# Patient Record
Sex: Female | Born: 1940 | Race: Black or African American | Hispanic: No | Smoking: Former smoker
Health system: Southern US, Community
[De-identification: ages and names within clinical notes are randomized; demographics above are authoritative.]

## PROBLEM LIST (undated history)

## (undated) DIAGNOSIS — I2510 Atherosclerotic heart disease of native coronary artery without angina pectoris: Secondary | ICD-10-CM

## (undated) DIAGNOSIS — M069 Rheumatoid arthritis, unspecified: Secondary | ICD-10-CM

## (undated) DIAGNOSIS — G4730 Sleep apnea, unspecified: Secondary | ICD-10-CM

## (undated) DIAGNOSIS — I5030 Unspecified diastolic (congestive) heart failure: Secondary | ICD-10-CM

## (undated) DIAGNOSIS — K219 Gastro-esophageal reflux disease without esophagitis: Secondary | ICD-10-CM

## (undated) DIAGNOSIS — E039 Hypothyroidism, unspecified: Secondary | ICD-10-CM

## (undated) DIAGNOSIS — K2970 Gastritis, unspecified, without bleeding: Secondary | ICD-10-CM

## (undated) DIAGNOSIS — I4892 Unspecified atrial flutter: Secondary | ICD-10-CM

## (undated) DIAGNOSIS — I480 Paroxysmal atrial fibrillation: Secondary | ICD-10-CM

## (undated) DIAGNOSIS — T7840XA Allergy, unspecified, initial encounter: Secondary | ICD-10-CM

## (undated) DIAGNOSIS — D689 Coagulation defect, unspecified: Secondary | ICD-10-CM

## (undated) DIAGNOSIS — E785 Hyperlipidemia, unspecified: Secondary | ICD-10-CM

## (undated) DIAGNOSIS — M1990 Unspecified osteoarthritis, unspecified site: Secondary | ICD-10-CM

## (undated) DIAGNOSIS — K31819 Angiodysplasia of stomach and duodenum without bleeding: Secondary | ICD-10-CM

## (undated) DIAGNOSIS — G709 Myoneural disorder, unspecified: Secondary | ICD-10-CM

## (undated) DIAGNOSIS — I499 Cardiac arrhythmia, unspecified: Secondary | ICD-10-CM

## (undated) DIAGNOSIS — E669 Obesity, unspecified: Secondary | ICD-10-CM

## (undated) DIAGNOSIS — K449 Diaphragmatic hernia without obstruction or gangrene: Secondary | ICD-10-CM

## (undated) DIAGNOSIS — K644 Residual hemorrhoidal skin tags: Secondary | ICD-10-CM

## (undated) DIAGNOSIS — Z8489 Family history of other specified conditions: Secondary | ICD-10-CM

## (undated) DIAGNOSIS — Z86010 Personal history of colonic polyps: Secondary | ICD-10-CM

## (undated) DIAGNOSIS — I10 Essential (primary) hypertension: Secondary | ICD-10-CM

## (undated) DIAGNOSIS — H269 Unspecified cataract: Secondary | ICD-10-CM

## (undated) DIAGNOSIS — Z7901 Long term (current) use of anticoagulants: Secondary | ICD-10-CM

## (undated) DIAGNOSIS — IMO0002 Reserved for concepts with insufficient information to code with codable children: Secondary | ICD-10-CM

## (undated) DIAGNOSIS — E1122 Type 2 diabetes mellitus with diabetic chronic kidney disease: Secondary | ICD-10-CM

## (undated) DIAGNOSIS — M109 Gout, unspecified: Secondary | ICD-10-CM

## (undated) DIAGNOSIS — D500 Iron deficiency anemia secondary to blood loss (chronic): Secondary | ICD-10-CM

## (undated) HISTORY — DX: Gastro-esophageal reflux disease without esophagitis: K21.9

## (undated) HISTORY — PX: BREAST EXCISIONAL BIOPSY: SUR124

## (undated) HISTORY — DX: Sleep apnea, unspecified: G47.30

## (undated) HISTORY — DX: Reserved for concepts with insufficient information to code with codable children: IMO0002

## (undated) HISTORY — DX: Hypothyroidism, unspecified: E03.9

## (undated) HISTORY — DX: Diaphragmatic hernia without obstruction or gangrene: K44.9

## (undated) HISTORY — DX: Gastritis, unspecified, without bleeding: K29.70

## (undated) HISTORY — DX: Allergy, unspecified, initial encounter: T78.40XA

## (undated) HISTORY — DX: Hyperlipidemia, unspecified: E78.5

## (undated) HISTORY — PX: CATARACT EXTRACTION: SUR2

## (undated) HISTORY — DX: Unspecified osteoarthritis, unspecified site: M19.90

## (undated) HISTORY — DX: Residual hemorrhoidal skin tags: K64.4

## (undated) HISTORY — DX: Atherosclerotic heart disease of native coronary artery without angina pectoris: I25.10

## (undated) HISTORY — DX: Personal history of colonic polyps: Z86.010

## (undated) HISTORY — DX: Myoneural disorder, unspecified: G70.9

## (undated) HISTORY — DX: Essential (primary) hypertension: I10

## (undated) HISTORY — DX: Unspecified cataract: H26.9

## (undated) HISTORY — PX: BREAST BIOPSY: SHX20

## (undated) HISTORY — DX: Unspecified diastolic (congestive) heart failure: I50.30

## (undated) HISTORY — PX: CARDIAC CATHETERIZATION: SHX172

## (undated) HISTORY — PX: COLONOSCOPY: SHX174

## (undated) HISTORY — PX: POLYPECTOMY: SHX149

## (undated) HISTORY — DX: Long term (current) use of anticoagulants: Z79.01

## (undated) HISTORY — DX: Obesity, unspecified: E66.9

## (undated) HISTORY — DX: Unspecified atrial flutter: I48.92

## (undated) HISTORY — DX: Paroxysmal atrial fibrillation: I48.0

## (undated) HISTORY — DX: Coagulation defect, unspecified: D68.9

---

## 1988-02-27 HISTORY — PX: CATARACT EXTRACTION: SUR2

## 1997-06-14 ENCOUNTER — Encounter: Admission: RE | Admit: 1997-06-14 | Discharge: 1997-06-14 | Payer: Self-pay | Admitting: Family Medicine

## 1997-06-24 ENCOUNTER — Encounter: Admission: RE | Admit: 1997-06-24 | Discharge: 1997-06-24 | Payer: Self-pay | Admitting: Family Medicine

## 1997-06-29 ENCOUNTER — Encounter: Admission: RE | Admit: 1997-06-29 | Discharge: 1997-06-29 | Payer: Self-pay | Admitting: Family Medicine

## 1997-07-02 ENCOUNTER — Encounter: Admission: RE | Admit: 1997-07-02 | Discharge: 1997-07-02 | Payer: Self-pay | Admitting: Family Medicine

## 1997-07-07 ENCOUNTER — Encounter: Admission: RE | Admit: 1997-07-07 | Discharge: 1997-07-07 | Payer: Self-pay | Admitting: Family Medicine

## 1997-09-06 ENCOUNTER — Encounter: Admission: RE | Admit: 1997-09-06 | Discharge: 1997-09-06 | Payer: Self-pay | Admitting: Family Medicine

## 1997-09-20 ENCOUNTER — Encounter: Admission: RE | Admit: 1997-09-20 | Discharge: 1997-09-20 | Payer: Self-pay | Admitting: Family Medicine

## 1997-10-25 ENCOUNTER — Encounter: Admission: RE | Admit: 1997-10-25 | Discharge: 1997-10-25 | Payer: Self-pay | Admitting: Family Medicine

## 1997-11-16 ENCOUNTER — Ambulatory Visit (HOSPITAL_COMMUNITY): Admission: RE | Admit: 1997-11-16 | Discharge: 1997-11-16 | Payer: Self-pay | Admitting: *Deleted

## 1997-12-06 ENCOUNTER — Encounter: Admission: RE | Admit: 1997-12-06 | Discharge: 1997-12-06 | Payer: Self-pay | Admitting: Family Medicine

## 1997-12-17 ENCOUNTER — Encounter: Admission: RE | Admit: 1997-12-17 | Discharge: 1997-12-17 | Payer: Self-pay | Admitting: Family Medicine

## 1998-01-07 ENCOUNTER — Encounter: Admission: RE | Admit: 1998-01-07 | Discharge: 1998-01-07 | Payer: Self-pay | Admitting: Family Medicine

## 1998-01-17 ENCOUNTER — Emergency Department (HOSPITAL_COMMUNITY): Admission: EM | Admit: 1998-01-17 | Discharge: 1998-01-17 | Payer: Self-pay | Admitting: Emergency Medicine

## 1998-01-17 ENCOUNTER — Encounter: Payer: Self-pay | Admitting: Emergency Medicine

## 1998-02-07 ENCOUNTER — Encounter: Admission: RE | Admit: 1998-02-07 | Discharge: 1998-02-07 | Payer: Self-pay | Admitting: Family Medicine

## 1998-02-28 ENCOUNTER — Encounter: Admission: RE | Admit: 1998-02-28 | Discharge: 1998-02-28 | Payer: Self-pay | Admitting: Family Medicine

## 1998-03-04 ENCOUNTER — Encounter: Admission: RE | Admit: 1998-03-04 | Discharge: 1998-03-04 | Payer: Self-pay | Admitting: Family Medicine

## 1998-03-28 ENCOUNTER — Encounter: Admission: RE | Admit: 1998-03-28 | Discharge: 1998-03-28 | Payer: Self-pay | Admitting: Family Medicine

## 1998-04-07 ENCOUNTER — Encounter: Admission: RE | Admit: 1998-04-07 | Discharge: 1998-06-08 | Payer: Self-pay | Admitting: Family Medicine

## 1998-07-06 ENCOUNTER — Encounter: Admission: RE | Admit: 1998-07-06 | Discharge: 1998-07-06 | Payer: Self-pay | Admitting: Family Medicine

## 1998-07-13 ENCOUNTER — Encounter: Admission: RE | Admit: 1998-07-13 | Discharge: 1998-07-13 | Payer: Self-pay | Admitting: Family Medicine

## 1998-08-19 ENCOUNTER — Encounter: Admission: RE | Admit: 1998-08-19 | Discharge: 1998-08-19 | Payer: Self-pay | Admitting: Family Medicine

## 1998-09-16 ENCOUNTER — Encounter: Admission: RE | Admit: 1998-09-16 | Discharge: 1998-09-16 | Payer: Self-pay | Admitting: Family Medicine

## 1998-10-17 ENCOUNTER — Encounter: Admission: RE | Admit: 1998-10-17 | Discharge: 1998-10-17 | Payer: Self-pay | Admitting: Family Medicine

## 1998-12-05 ENCOUNTER — Encounter: Payer: Self-pay | Admitting: Internal Medicine

## 1998-12-05 ENCOUNTER — Emergency Department (HOSPITAL_COMMUNITY): Admission: EM | Admit: 1998-12-05 | Discharge: 1998-12-05 | Payer: Self-pay | Admitting: Internal Medicine

## 1999-03-06 ENCOUNTER — Encounter: Admission: RE | Admit: 1999-03-06 | Discharge: 1999-03-06 | Payer: Self-pay | Admitting: Family Medicine

## 1999-03-14 ENCOUNTER — Encounter: Admission: RE | Admit: 1999-03-14 | Discharge: 1999-03-14 | Payer: Self-pay | Admitting: Family Medicine

## 1999-03-14 ENCOUNTER — Encounter: Payer: Self-pay | Admitting: Sports Medicine

## 1999-04-03 ENCOUNTER — Encounter: Admission: RE | Admit: 1999-04-03 | Discharge: 1999-04-03 | Payer: Self-pay | Admitting: Family Medicine

## 1999-04-28 ENCOUNTER — Encounter: Admission: RE | Admit: 1999-04-28 | Discharge: 1999-04-28 | Payer: Self-pay | Admitting: Family Medicine

## 1999-05-03 ENCOUNTER — Encounter: Admission: RE | Admit: 1999-05-03 | Discharge: 1999-08-01 | Payer: Self-pay | Admitting: Family Medicine

## 1999-05-22 ENCOUNTER — Encounter: Admission: RE | Admit: 1999-05-22 | Discharge: 1999-05-22 | Payer: Self-pay | Admitting: Family Medicine

## 1999-06-12 ENCOUNTER — Encounter: Admission: RE | Admit: 1999-06-12 | Discharge: 1999-06-12 | Payer: Self-pay | Admitting: Family Medicine

## 1999-06-13 ENCOUNTER — Encounter: Admission: RE | Admit: 1999-06-13 | Discharge: 1999-06-13 | Payer: Self-pay | Admitting: Family Medicine

## 1999-06-26 ENCOUNTER — Encounter: Admission: RE | Admit: 1999-06-26 | Discharge: 1999-06-26 | Payer: Self-pay | Admitting: Family Medicine

## 1999-07-07 ENCOUNTER — Encounter: Payer: Self-pay | Admitting: Sports Medicine

## 1999-07-07 ENCOUNTER — Encounter: Admission: RE | Admit: 1999-07-07 | Discharge: 1999-07-07 | Payer: Self-pay | Admitting: Family Medicine

## 1999-07-13 ENCOUNTER — Encounter: Admission: RE | Admit: 1999-07-13 | Discharge: 1999-07-13 | Payer: Self-pay | Admitting: Family Medicine

## 1999-09-06 ENCOUNTER — Encounter: Admission: RE | Admit: 1999-09-06 | Discharge: 1999-09-06 | Payer: Self-pay | Admitting: *Deleted

## 1999-09-06 ENCOUNTER — Encounter: Admission: RE | Admit: 1999-09-06 | Discharge: 1999-09-06 | Payer: Self-pay | Admitting: Family Medicine

## 1999-09-06 ENCOUNTER — Encounter: Payer: Self-pay | Admitting: *Deleted

## 1999-09-07 ENCOUNTER — Encounter: Admission: RE | Admit: 1999-09-07 | Discharge: 1999-09-07 | Payer: Self-pay | Admitting: Family Medicine

## 1999-09-07 ENCOUNTER — Inpatient Hospital Stay (HOSPITAL_COMMUNITY): Admission: EM | Admit: 1999-09-07 | Discharge: 1999-09-14 | Payer: Self-pay | Admitting: Emergency Medicine

## 1999-09-07 ENCOUNTER — Ambulatory Visit (HOSPITAL_COMMUNITY): Admission: RE | Admit: 1999-09-07 | Discharge: 1999-09-07 | Payer: Self-pay | Admitting: Family Medicine

## 1999-09-08 ENCOUNTER — Encounter: Payer: Self-pay | Admitting: Family Medicine

## 1999-09-09 ENCOUNTER — Encounter: Payer: Self-pay | Admitting: Family Medicine

## 1999-09-10 ENCOUNTER — Encounter: Payer: Self-pay | Admitting: Family Medicine

## 1999-10-02 ENCOUNTER — Encounter: Admission: RE | Admit: 1999-10-02 | Discharge: 1999-10-02 | Payer: Self-pay | Admitting: Family Medicine

## 1999-10-27 ENCOUNTER — Encounter: Admission: RE | Admit: 1999-10-27 | Discharge: 1999-10-27 | Payer: Self-pay | Admitting: Family Medicine

## 1999-12-13 ENCOUNTER — Encounter: Admission: RE | Admit: 1999-12-13 | Discharge: 1999-12-13 | Payer: Self-pay | Admitting: Family Medicine

## 2000-04-08 ENCOUNTER — Encounter: Admission: RE | Admit: 2000-04-08 | Discharge: 2000-04-08 | Payer: Self-pay | Admitting: Family Medicine

## 2000-05-01 ENCOUNTER — Encounter: Admission: RE | Admit: 2000-05-01 | Discharge: 2000-05-01 | Payer: Self-pay | Admitting: Family Medicine

## 2000-05-20 ENCOUNTER — Encounter: Admission: RE | Admit: 2000-05-20 | Discharge: 2000-05-20 | Payer: Self-pay | Admitting: Family Medicine

## 2000-07-12 ENCOUNTER — Encounter: Admission: RE | Admit: 2000-07-12 | Discharge: 2000-07-12 | Payer: Self-pay | Admitting: Family Medicine

## 2000-07-12 ENCOUNTER — Ambulatory Visit (HOSPITAL_COMMUNITY): Admission: RE | Admit: 2000-07-12 | Discharge: 2000-07-12 | Payer: Self-pay | Admitting: Family Medicine

## 2000-07-30 ENCOUNTER — Encounter: Admission: RE | Admit: 2000-07-30 | Discharge: 2000-07-30 | Payer: Self-pay | Admitting: Family Medicine

## 2000-08-02 ENCOUNTER — Encounter: Admission: RE | Admit: 2000-08-02 | Discharge: 2000-08-02 | Payer: Self-pay | Admitting: Family Medicine

## 2000-08-26 ENCOUNTER — Encounter: Admission: RE | Admit: 2000-08-26 | Discharge: 2000-08-26 | Payer: Self-pay | Admitting: Family Medicine

## 2000-08-30 ENCOUNTER — Encounter: Payer: Self-pay | Admitting: Family Medicine

## 2000-08-30 ENCOUNTER — Encounter: Admission: RE | Admit: 2000-08-30 | Discharge: 2000-08-30 | Payer: Self-pay | Admitting: Family Medicine

## 2000-09-05 ENCOUNTER — Encounter: Payer: Self-pay | Admitting: *Deleted

## 2000-09-05 ENCOUNTER — Encounter: Admission: RE | Admit: 2000-09-05 | Discharge: 2000-09-05 | Payer: Self-pay | Admitting: Family Medicine

## 2000-09-05 ENCOUNTER — Encounter: Admission: RE | Admit: 2000-09-05 | Discharge: 2000-09-05 | Payer: Self-pay | Admitting: *Deleted

## 2000-09-11 ENCOUNTER — Encounter: Payer: Self-pay | Admitting: Gastroenterology

## 2000-09-11 ENCOUNTER — Encounter: Admission: RE | Admit: 2000-09-11 | Discharge: 2000-09-11 | Payer: Self-pay | Admitting: Gastroenterology

## 2000-09-13 ENCOUNTER — Encounter: Admission: RE | Admit: 2000-09-13 | Discharge: 2000-09-13 | Payer: Self-pay | Admitting: Family Medicine

## 2000-09-27 ENCOUNTER — Encounter: Admission: RE | Admit: 2000-09-27 | Discharge: 2000-09-27 | Payer: Self-pay | Admitting: Family Medicine

## 2000-09-27 ENCOUNTER — Encounter: Payer: Self-pay | Admitting: Family Medicine

## 2000-12-04 ENCOUNTER — Encounter
Admission: RE | Admit: 2000-12-04 | Discharge: 2000-12-04 | Payer: Self-pay | Admitting: Physical Medicine and Rehabilitation

## 2000-12-04 ENCOUNTER — Encounter: Payer: Self-pay | Admitting: Physical Medicine and Rehabilitation

## 2000-12-06 ENCOUNTER — Encounter: Admission: RE | Admit: 2000-12-06 | Discharge: 2000-12-06 | Payer: Self-pay | Admitting: Family Medicine

## 2000-12-06 ENCOUNTER — Encounter: Payer: Self-pay | Admitting: Family Medicine

## 2000-12-16 ENCOUNTER — Ambulatory Visit (HOSPITAL_BASED_OUTPATIENT_CLINIC_OR_DEPARTMENT_OTHER): Admission: RE | Admit: 2000-12-16 | Discharge: 2000-12-16 | Payer: Self-pay | Admitting: Otolaryngology

## 2001-02-26 HISTORY — PX: CARPAL TUNNEL RELEASE: SHX101

## 2001-03-07 ENCOUNTER — Encounter: Admission: RE | Admit: 2001-03-07 | Discharge: 2001-03-07 | Payer: Self-pay | Admitting: Family Medicine

## 2001-04-15 ENCOUNTER — Encounter: Admission: RE | Admit: 2001-04-15 | Discharge: 2001-04-15 | Payer: Self-pay | Admitting: Sports Medicine

## 2001-04-28 ENCOUNTER — Encounter: Admission: RE | Admit: 2001-04-28 | Discharge: 2001-04-28 | Payer: Self-pay | Admitting: Family Medicine

## 2001-05-01 ENCOUNTER — Encounter: Admission: RE | Admit: 2001-05-01 | Discharge: 2001-05-01 | Payer: Self-pay | Admitting: Family Medicine

## 2001-05-05 ENCOUNTER — Encounter: Admission: RE | Admit: 2001-05-05 | Discharge: 2001-05-05 | Payer: Self-pay | Admitting: Family Medicine

## 2001-05-19 ENCOUNTER — Encounter: Admission: RE | Admit: 2001-05-19 | Discharge: 2001-05-19 | Payer: Self-pay | Admitting: Family Medicine

## 2001-06-20 ENCOUNTER — Encounter: Admission: RE | Admit: 2001-06-20 | Discharge: 2001-06-20 | Payer: Self-pay | Admitting: Family Medicine

## 2001-07-07 ENCOUNTER — Encounter: Admission: RE | Admit: 2001-07-07 | Discharge: 2001-07-07 | Payer: Self-pay | Admitting: Family Medicine

## 2001-09-08 ENCOUNTER — Encounter: Admission: RE | Admit: 2001-09-08 | Discharge: 2001-09-08 | Payer: Self-pay | Admitting: Family Medicine

## 2001-09-17 ENCOUNTER — Encounter: Admission: RE | Admit: 2001-09-17 | Discharge: 2001-09-17 | Payer: Self-pay | Admitting: Family Medicine

## 2001-12-02 ENCOUNTER — Ambulatory Visit (HOSPITAL_BASED_OUTPATIENT_CLINIC_OR_DEPARTMENT_OTHER): Admission: RE | Admit: 2001-12-02 | Discharge: 2001-12-02 | Payer: Self-pay | Admitting: Orthopedic Surgery

## 2001-12-15 ENCOUNTER — Encounter: Admission: RE | Admit: 2001-12-15 | Discharge: 2001-12-15 | Payer: Self-pay | Admitting: Family Medicine

## 2001-12-21 ENCOUNTER — Encounter: Admission: RE | Admit: 2001-12-21 | Discharge: 2001-12-21 | Payer: Self-pay | Admitting: Sports Medicine

## 2001-12-21 ENCOUNTER — Encounter: Payer: Self-pay | Admitting: Sports Medicine

## 2002-01-30 ENCOUNTER — Encounter: Admission: RE | Admit: 2002-01-30 | Discharge: 2002-01-30 | Payer: Self-pay | Admitting: Family Medicine

## 2002-03-31 ENCOUNTER — Encounter: Admission: RE | Admit: 2002-03-31 | Discharge: 2002-03-31 | Payer: Self-pay | Admitting: Sports Medicine

## 2002-04-01 ENCOUNTER — Ambulatory Visit (HOSPITAL_COMMUNITY): Admission: RE | Admit: 2002-04-01 | Discharge: 2002-04-01 | Payer: Self-pay | Admitting: Sports Medicine

## 2002-04-20 ENCOUNTER — Encounter: Admission: RE | Admit: 2002-04-20 | Discharge: 2002-04-20 | Payer: Self-pay | Admitting: Family Medicine

## 2002-04-27 ENCOUNTER — Encounter: Admission: RE | Admit: 2002-04-27 | Discharge: 2002-04-27 | Payer: Self-pay | Admitting: Family Medicine

## 2002-04-28 ENCOUNTER — Encounter: Payer: Self-pay | Admitting: Family Medicine

## 2002-04-28 ENCOUNTER — Encounter: Admission: RE | Admit: 2002-04-28 | Discharge: 2002-04-28 | Payer: Self-pay | Admitting: Family Medicine

## 2002-04-29 ENCOUNTER — Encounter: Payer: Self-pay | Admitting: Family Medicine

## 2002-04-29 ENCOUNTER — Encounter: Admission: RE | Admit: 2002-04-29 | Discharge: 2002-04-29 | Payer: Self-pay | Admitting: Family Medicine

## 2002-06-01 ENCOUNTER — Encounter: Admission: RE | Admit: 2002-06-01 | Discharge: 2002-06-01 | Payer: Self-pay | Admitting: Family Medicine

## 2002-06-01 ENCOUNTER — Ambulatory Visit (HOSPITAL_COMMUNITY): Admission: RE | Admit: 2002-06-01 | Discharge: 2002-06-01 | Payer: Self-pay | Admitting: Family Medicine

## 2002-07-09 ENCOUNTER — Ambulatory Visit (HOSPITAL_COMMUNITY): Admission: RE | Admit: 2002-07-09 | Discharge: 2002-07-10 | Payer: Self-pay | Admitting: *Deleted

## 2002-07-09 ENCOUNTER — Encounter: Payer: Self-pay | Admitting: *Deleted

## 2002-07-20 ENCOUNTER — Encounter: Admission: RE | Admit: 2002-07-20 | Discharge: 2002-07-20 | Payer: Self-pay | Admitting: Family Medicine

## 2002-08-17 ENCOUNTER — Encounter: Admission: RE | Admit: 2002-08-17 | Discharge: 2002-08-17 | Payer: Self-pay | Admitting: Family Medicine

## 2002-08-27 ENCOUNTER — Encounter: Admission: RE | Admit: 2002-08-27 | Discharge: 2002-08-27 | Payer: Self-pay | Admitting: Sports Medicine

## 2002-09-09 ENCOUNTER — Encounter: Admission: RE | Admit: 2002-09-09 | Discharge: 2002-09-09 | Payer: Self-pay | Admitting: Family Medicine

## 2002-12-19 ENCOUNTER — Emergency Department (HOSPITAL_COMMUNITY): Admission: AD | Admit: 2002-12-19 | Discharge: 2002-12-19 | Payer: Self-pay | Admitting: Family Medicine

## 2002-12-23 ENCOUNTER — Encounter: Admission: RE | Admit: 2002-12-23 | Discharge: 2002-12-23 | Payer: Self-pay | Admitting: Family Medicine

## 2003-01-04 ENCOUNTER — Encounter: Admission: RE | Admit: 2003-01-04 | Discharge: 2003-01-04 | Payer: Self-pay | Admitting: Family Medicine

## 2003-01-04 ENCOUNTER — Encounter: Payer: Self-pay | Admitting: Family Medicine

## 2003-01-18 ENCOUNTER — Encounter: Admission: RE | Admit: 2003-01-18 | Discharge: 2003-01-18 | Payer: Self-pay | Admitting: Family Medicine

## 2003-02-01 ENCOUNTER — Encounter: Admission: RE | Admit: 2003-02-01 | Discharge: 2003-02-01 | Payer: Self-pay | Admitting: Family Medicine

## 2003-03-09 ENCOUNTER — Encounter: Admission: RE | Admit: 2003-03-09 | Discharge: 2003-03-09 | Payer: Self-pay | Admitting: Family Medicine

## 2003-03-26 ENCOUNTER — Encounter: Admission: RE | Admit: 2003-03-26 | Discharge: 2003-03-26 | Payer: Self-pay | Admitting: Family Medicine

## 2003-04-26 ENCOUNTER — Encounter: Admission: RE | Admit: 2003-04-26 | Discharge: 2003-04-26 | Payer: Self-pay | Admitting: Family Medicine

## 2003-09-20 ENCOUNTER — Encounter: Admission: RE | Admit: 2003-09-20 | Discharge: 2003-09-20 | Payer: Self-pay | Admitting: Family Medicine

## 2003-09-22 ENCOUNTER — Encounter: Admission: RE | Admit: 2003-09-22 | Discharge: 2003-09-22 | Payer: Self-pay | Admitting: Family Medicine

## 2003-10-15 ENCOUNTER — Encounter: Admission: RE | Admit: 2003-10-15 | Discharge: 2003-10-15 | Payer: Self-pay | Admitting: Family Medicine

## 2003-10-25 ENCOUNTER — Ambulatory Visit (HOSPITAL_COMMUNITY): Admission: RE | Admit: 2003-10-25 | Discharge: 2003-10-25 | Payer: Self-pay | Admitting: Gastroenterology

## 2003-10-25 ENCOUNTER — Encounter (INDEPENDENT_AMBULATORY_CARE_PROVIDER_SITE_OTHER): Payer: Self-pay | Admitting: Specialist

## 2003-10-25 ENCOUNTER — Encounter (INDEPENDENT_AMBULATORY_CARE_PROVIDER_SITE_OTHER): Payer: Self-pay | Admitting: *Deleted

## 2003-10-27 ENCOUNTER — Ambulatory Visit (HOSPITAL_COMMUNITY): Admission: RE | Admit: 2003-10-27 | Discharge: 2003-10-27 | Payer: Self-pay | Admitting: Gastroenterology

## 2003-11-15 ENCOUNTER — Ambulatory Visit (HOSPITAL_COMMUNITY): Admission: RE | Admit: 2003-11-15 | Discharge: 2003-11-15 | Payer: Self-pay | Admitting: Gastroenterology

## 2003-11-15 ENCOUNTER — Encounter (INDEPENDENT_AMBULATORY_CARE_PROVIDER_SITE_OTHER): Payer: Self-pay | Admitting: *Deleted

## 2003-12-08 ENCOUNTER — Encounter: Admission: RE | Admit: 2003-12-08 | Discharge: 2003-12-08 | Payer: Self-pay | Admitting: Gastroenterology

## 2004-01-07 ENCOUNTER — Ambulatory Visit: Payer: Self-pay | Admitting: Family Medicine

## 2004-03-23 ENCOUNTER — Ambulatory Visit: Payer: Self-pay | Admitting: Family Medicine

## 2004-04-24 ENCOUNTER — Ambulatory Visit: Payer: Self-pay | Admitting: Family Medicine

## 2004-09-04 ENCOUNTER — Ambulatory Visit: Payer: Self-pay | Admitting: Family Medicine

## 2004-10-16 ENCOUNTER — Ambulatory Visit: Payer: Self-pay | Admitting: Family Medicine

## 2004-11-01 ENCOUNTER — Encounter: Admission: RE | Admit: 2004-11-01 | Discharge: 2004-11-01 | Payer: Self-pay | Admitting: Family Medicine

## 2004-11-17 ENCOUNTER — Ambulatory Visit: Payer: Self-pay | Admitting: Family Medicine

## 2005-02-09 ENCOUNTER — Ambulatory Visit: Payer: Self-pay | Admitting: Family Medicine

## 2005-08-17 ENCOUNTER — Ambulatory Visit: Payer: Self-pay | Admitting: Family Medicine

## 2005-08-20 ENCOUNTER — Ambulatory Visit: Payer: Self-pay | Admitting: Cardiovascular Disease

## 2005-08-28 ENCOUNTER — Ambulatory Visit: Payer: Self-pay

## 2005-10-08 ENCOUNTER — Ambulatory Visit: Payer: Self-pay | Admitting: Family Medicine

## 2005-10-15 ENCOUNTER — Ambulatory Visit: Payer: Self-pay | Admitting: Family Medicine

## 2005-10-27 ENCOUNTER — Encounter (INDEPENDENT_AMBULATORY_CARE_PROVIDER_SITE_OTHER): Payer: Self-pay | Admitting: *Deleted

## 2005-10-27 LAB — CONVERTED CEMR LAB

## 2005-11-01 ENCOUNTER — Ambulatory Visit: Payer: Self-pay | Admitting: Family Medicine

## 2005-11-19 ENCOUNTER — Ambulatory Visit: Payer: Self-pay | Admitting: Family Medicine

## 2005-11-19 ENCOUNTER — Encounter: Payer: Self-pay | Admitting: Family Medicine

## 2005-11-22 ENCOUNTER — Encounter: Admission: RE | Admit: 2005-11-22 | Discharge: 2005-11-22 | Payer: Self-pay | Admitting: Family Medicine

## 2005-12-04 ENCOUNTER — Encounter: Admission: RE | Admit: 2005-12-04 | Discharge: 2005-12-04 | Payer: Self-pay | Admitting: Family Medicine

## 2005-12-25 ENCOUNTER — Ambulatory Visit: Payer: Self-pay | Admitting: Sports Medicine

## 2006-01-07 ENCOUNTER — Ambulatory Visit: Payer: Self-pay | Admitting: Sports Medicine

## 2006-01-29 ENCOUNTER — Ambulatory Visit: Payer: Self-pay | Admitting: Family Medicine

## 2006-02-27 ENCOUNTER — Ambulatory Visit: Payer: Self-pay | Admitting: Cardiovascular Disease

## 2006-03-25 ENCOUNTER — Ambulatory Visit: Payer: Self-pay | Admitting: Family Medicine

## 2006-04-25 DIAGNOSIS — E1122 Type 2 diabetes mellitus with diabetic chronic kidney disease: Secondary | ICD-10-CM | POA: Insufficient documentation

## 2006-04-25 DIAGNOSIS — R42 Dizziness and giddiness: Secondary | ICD-10-CM | POA: Insufficient documentation

## 2006-04-25 DIAGNOSIS — K2100 Gastro-esophageal reflux disease with esophagitis, without bleeding: Secondary | ICD-10-CM | POA: Insufficient documentation

## 2006-04-25 DIAGNOSIS — E7800 Pure hypercholesterolemia, unspecified: Secondary | ICD-10-CM | POA: Insufficient documentation

## 2006-04-25 DIAGNOSIS — K5730 Diverticulosis of large intestine without perforation or abscess without bleeding: Secondary | ICD-10-CM | POA: Insufficient documentation

## 2006-04-25 DIAGNOSIS — G5600 Carpal tunnel syndrome, unspecified upper limb: Secondary | ICD-10-CM | POA: Insufficient documentation

## 2006-04-25 DIAGNOSIS — G44209 Tension-type headache, unspecified, not intractable: Secondary | ICD-10-CM | POA: Insufficient documentation

## 2006-04-25 DIAGNOSIS — E669 Obesity, unspecified: Secondary | ICD-10-CM | POA: Insufficient documentation

## 2006-04-25 DIAGNOSIS — M79609 Pain in unspecified limb: Secondary | ICD-10-CM | POA: Insufficient documentation

## 2006-04-25 DIAGNOSIS — M1990 Unspecified osteoarthritis, unspecified site: Secondary | ICD-10-CM

## 2006-04-25 DIAGNOSIS — I10 Essential (primary) hypertension: Secondary | ICD-10-CM | POA: Insufficient documentation

## 2006-04-25 DIAGNOSIS — E039 Hypothyroidism, unspecified: Secondary | ICD-10-CM | POA: Insufficient documentation

## 2006-04-25 DIAGNOSIS — I2510 Atherosclerotic heart disease of native coronary artery without angina pectoris: Secondary | ICD-10-CM | POA: Insufficient documentation

## 2006-04-25 DIAGNOSIS — G4730 Sleep apnea, unspecified: Secondary | ICD-10-CM | POA: Insufficient documentation

## 2006-04-25 HISTORY — DX: Diverticulosis of large intestine without perforation or abscess without bleeding: K57.30

## 2006-04-25 HISTORY — DX: Unspecified osteoarthritis, unspecified site: M19.90

## 2006-04-25 HISTORY — DX: Type 2 diabetes mellitus with diabetic chronic kidney disease: E11.22

## 2006-04-25 HISTORY — DX: Dizziness and giddiness: R42

## 2006-04-26 ENCOUNTER — Encounter (INDEPENDENT_AMBULATORY_CARE_PROVIDER_SITE_OTHER): Payer: Self-pay | Admitting: *Deleted

## 2006-05-20 ENCOUNTER — Ambulatory Visit: Payer: Self-pay | Admitting: Family Medicine

## 2006-05-20 DIAGNOSIS — R5381 Other malaise: Secondary | ICD-10-CM | POA: Insufficient documentation

## 2006-05-20 DIAGNOSIS — M549 Dorsalgia, unspecified: Secondary | ICD-10-CM | POA: Insufficient documentation

## 2006-05-20 DIAGNOSIS — R5383 Other fatigue: Secondary | ICD-10-CM | POA: Insufficient documentation

## 2006-05-20 LAB — CONVERTED CEMR LAB
ALT: 32 units/L (ref 0–35)
AST: 24 units/L (ref 0–37)
Albumin: 4.5 g/dL (ref 3.5–5.2)
Alkaline Phosphatase: 98 units/L (ref 39–117)
BUN: 12 mg/dL (ref 6–23)
Basophils Absolute: 0 10*3/uL (ref 0.0–0.1)
Basophils Relative: 0 % (ref 0–1)
CO2: 23 meq/L (ref 19–32)
Calcium: 10.3 mg/dL (ref 8.4–10.5)
Chloride: 98 meq/L (ref 96–112)
Creatinine, Ser: 0.58 mg/dL (ref 0.40–1.20)
Eosinophils Absolute: 0.6 10*3/uL (ref 0.0–0.7)
Eosinophils Relative: 7 % — ABNORMAL HIGH (ref 0–5)
Glucose, Bld: 116 mg/dL — ABNORMAL HIGH (ref 70–99)
HCT: 40.8 % (ref 36.0–46.0)
Hemoglobin: 12.9 g/dL (ref 12.0–15.0)
Hgb A1c MFr Bld: 7.9 %
Lymphocytes Relative: 37 % (ref 12–46)
Lymphs Abs: 3 10*3/uL (ref 0.7–3.3)
MCHC: 31.6 g/dL (ref 30.0–36.0)
MCV: 86.6 fL (ref 78.0–100.0)
Monocytes Absolute: 0.4 10*3/uL (ref 0.2–0.7)
Monocytes Relative: 5 % (ref 3–11)
Neutro Abs: 4.1 10*3/uL (ref 1.7–7.7)
Neutrophils Relative %: 51 % (ref 43–77)
Platelets: 283 10*3/uL (ref 150–400)
Potassium: 4.1 meq/L (ref 3.5–5.3)
RBC: 4.71 M/uL (ref 3.87–5.11)
RDW: 15.8 % — ABNORMAL HIGH (ref 11.5–14.0)
Sodium: 140 meq/L (ref 135–145)
TSH: 4.05 microintl units/mL (ref 0.350–5.50)
Total Bilirubin: 0.4 mg/dL (ref 0.3–1.2)
Total Protein: 8.1 g/dL (ref 6.0–8.3)
WBC: 8.1 10*3/uL (ref 4.0–10.5)

## 2006-05-23 ENCOUNTER — Encounter: Payer: Self-pay | Admitting: Family Medicine

## 2006-05-30 ENCOUNTER — Telehealth: Payer: Self-pay | Admitting: *Deleted

## 2006-07-09 ENCOUNTER — Ambulatory Visit (HOSPITAL_BASED_OUTPATIENT_CLINIC_OR_DEPARTMENT_OTHER): Admission: RE | Admit: 2006-07-09 | Discharge: 2006-07-09 | Payer: Self-pay | Admitting: Otolaryngology

## 2006-07-14 ENCOUNTER — Ambulatory Visit: Payer: Self-pay | Admitting: Internal Medicine

## 2006-07-30 ENCOUNTER — Emergency Department (HOSPITAL_COMMUNITY): Admission: EM | Admit: 2006-07-30 | Discharge: 2006-07-30 | Payer: Self-pay | Admitting: Emergency Medicine

## 2006-08-09 ENCOUNTER — Telehealth: Payer: Self-pay | Admitting: Family Medicine

## 2006-08-13 ENCOUNTER — Telehealth (INDEPENDENT_AMBULATORY_CARE_PROVIDER_SITE_OTHER): Payer: Self-pay | Admitting: Family Medicine

## 2006-08-26 ENCOUNTER — Ambulatory Visit: Payer: Self-pay | Admitting: Family Medicine

## 2006-08-26 LAB — CONVERTED CEMR LAB: Hgb A1c MFr Bld: 9.4 %

## 2006-09-19 ENCOUNTER — Ambulatory Visit: Payer: Self-pay | Admitting: Cardiovascular Disease

## 2006-09-30 ENCOUNTER — Ambulatory Visit: Payer: Self-pay

## 2006-10-01 ENCOUNTER — Ambulatory Visit: Payer: Self-pay

## 2006-10-09 ENCOUNTER — Telehealth: Payer: Self-pay | Admitting: Family Medicine

## 2006-11-21 ENCOUNTER — Ambulatory Visit: Payer: Self-pay | Admitting: Cardiovascular Disease

## 2006-12-09 ENCOUNTER — Ambulatory Visit: Payer: Self-pay | Admitting: Family Medicine

## 2006-12-09 DIAGNOSIS — R519 Headache, unspecified: Secondary | ICD-10-CM | POA: Insufficient documentation

## 2006-12-09 DIAGNOSIS — R51 Headache: Secondary | ICD-10-CM | POA: Insufficient documentation

## 2006-12-09 LAB — CONVERTED CEMR LAB: Hgb A1c MFr Bld: 9.1 %

## 2006-12-10 LAB — CONVERTED CEMR LAB
ALT: 18 units/L (ref 0–35)
AST: 14 units/L (ref 0–37)
Albumin: 4.6 g/dL (ref 3.5–5.2)
Alkaline Phosphatase: 78 units/L (ref 39–117)
BUN: 16 mg/dL (ref 6–23)
CO2: 24 meq/L (ref 19–32)
Calcium: 10.1 mg/dL (ref 8.4–10.5)
Chloride: 100 meq/L (ref 96–112)
Cholesterol: 258 mg/dL — ABNORMAL HIGH (ref 0–200)
Creatinine, Ser: 0.6 mg/dL (ref 0.40–1.20)
Glucose, Bld: 149 mg/dL — ABNORMAL HIGH (ref 70–99)
HDL: 51 mg/dL (ref >39)
LDL Cholesterol: 172 mg/dL — ABNORMAL HIGH (ref 0–99)
Potassium: 4.3 meq/L (ref 3.5–5.3)
Sodium: 138 meq/L (ref 135–145)
TSH: 3.536 microintl units/mL (ref 0.350–5.50)
Total Bilirubin: 0.4 mg/dL (ref 0.3–1.2)
Total CHOL/HDL Ratio: 5.1
Total Protein: 8.1 g/dL (ref 6.0–8.3)
Triglycerides: 176 mg/dL — ABNORMAL HIGH (ref <150)
VLDL: 35 mg/dL (ref 0–40)

## 2006-12-27 ENCOUNTER — Encounter: Admission: RE | Admit: 2006-12-27 | Discharge: 2006-12-27 | Payer: Self-pay | Admitting: Family Medicine

## 2006-12-30 ENCOUNTER — Ambulatory Visit: Payer: Self-pay | Admitting: Family Medicine

## 2007-02-06 ENCOUNTER — Encounter: Payer: Self-pay | Admitting: Family Medicine

## 2007-02-10 ENCOUNTER — Ambulatory Visit: Payer: Self-pay | Admitting: Family Medicine

## 2007-02-10 ENCOUNTER — Telehealth: Payer: Self-pay | Admitting: *Deleted

## 2007-02-10 DIAGNOSIS — M546 Pain in thoracic spine: Secondary | ICD-10-CM | POA: Insufficient documentation

## 2007-02-24 ENCOUNTER — Encounter: Payer: Self-pay | Admitting: Family Medicine

## 2007-03-07 ENCOUNTER — Encounter: Payer: Self-pay | Admitting: Family Medicine

## 2007-03-27 ENCOUNTER — Encounter: Payer: Self-pay | Admitting: Family Medicine

## 2007-03-27 LAB — CONVERTED CEMR LAB
ALT: 24 units/L
AST: 16 units/L
Albumin: 4.4 g/dL
Alkaline Phosphatase: 93 units/L
BUN: 14 mg/dL
Bilirubin, Direct: 0.1 mg/dL
CO2: 26 meq/L
Calcium: 9.9 mg/dL
Chloride: 98 meq/L
Creatinine, Ser: 0.85 mg/dL
Glucose, Bld: 305 mg/dL
HCT: 39.5 %
Hemoglobin: 12.7 g/dL
Indirect Bilirubin: 0.2 mg/dL
MCV: 89 fL
Platelets: 261 10*3/uL
Potassium: 4.2 meq/L
Sodium: 137 meq/L
TSH: 4.801 microintl units/mL
Total Bilirubin: 0.3 mg/dL
Total Protein: 7.7 g/dL
WBC: 9.6 10*3/uL

## 2007-04-23 ENCOUNTER — Telehealth: Payer: Self-pay | Admitting: *Deleted

## 2007-05-07 ENCOUNTER — Encounter: Payer: Self-pay | Admitting: Family Medicine

## 2007-06-05 ENCOUNTER — Encounter: Payer: Self-pay | Admitting: Family Medicine

## 2007-06-06 ENCOUNTER — Encounter: Admission: RE | Admit: 2007-06-06 | Discharge: 2007-06-06 | Payer: Self-pay | Admitting: Gastroenterology

## 2007-06-30 ENCOUNTER — Ambulatory Visit: Payer: Self-pay

## 2007-06-30 ENCOUNTER — Encounter: Payer: Self-pay | Admitting: Cardiovascular Disease

## 2007-06-30 ENCOUNTER — Ambulatory Visit: Payer: Self-pay | Admitting: Cardiovascular Disease

## 2007-08-28 ENCOUNTER — Ambulatory Visit: Payer: Self-pay | Admitting: Cardiovascular Disease

## 2007-09-22 ENCOUNTER — Telehealth: Payer: Self-pay | Admitting: Family Medicine

## 2007-09-24 ENCOUNTER — Encounter: Payer: Self-pay | Admitting: *Deleted

## 2007-09-29 ENCOUNTER — Telehealth: Payer: Self-pay | Admitting: *Deleted

## 2007-10-01 ENCOUNTER — Ambulatory Visit: Payer: Self-pay | Admitting: Family Medicine

## 2007-10-01 LAB — CONVERTED CEMR LAB
ALT: 13 units/L (ref 0–35)
AST: 11 units/L (ref 0–37)
Albumin: 4.3 g/dL (ref 3.5–5.2)
Alkaline Phosphatase: 75 units/L (ref 39–117)
BUN: 14 mg/dL (ref 6–23)
CO2: 24 meq/L (ref 19–32)
Calcium: 9.6 mg/dL (ref 8.4–10.5)
Chloride: 101 meq/L (ref 96–112)
Creatinine, Ser: 0.51 mg/dL (ref 0.40–1.20)
Direct LDL: 169 mg/dL — ABNORMAL HIGH
Glucose, Bld: 243 mg/dL — ABNORMAL HIGH (ref 70–99)
Hgb A1c MFr Bld: 10.2 %
Potassium: 4.1 meq/L (ref 3.5–5.3)
Sodium: 139 meq/L (ref 135–145)
Total Bilirubin: 0.5 mg/dL (ref 0.3–1.2)
Total Protein: 7.6 g/dL (ref 6.0–8.3)

## 2007-11-06 ENCOUNTER — Ambulatory Visit: Payer: Self-pay | Admitting: Cardiovascular Disease

## 2007-12-19 ENCOUNTER — Ambulatory Visit: Payer: Self-pay | Admitting: Family Medicine

## 2007-12-19 ENCOUNTER — Encounter: Admission: RE | Admit: 2007-12-19 | Discharge: 2007-12-19 | Payer: Self-pay | Admitting: Family Medicine

## 2007-12-19 LAB — CONVERTED CEMR LAB
ALT: 17 units/L (ref 0–35)
AST: 16 units/L (ref 0–37)
Albumin: 4.6 g/dL (ref 3.5–5.2)
Alkaline Phosphatase: 56 units/L (ref 39–117)
BUN: 13 mg/dL (ref 6–23)
CO2: 24 meq/L (ref 19–32)
Calcium: 10 mg/dL (ref 8.4–10.5)
Chloride: 103 meq/L (ref 96–112)
Cholesterol: 211 mg/dL — ABNORMAL HIGH (ref 0–200)
Creatinine, Ser: 0.65 mg/dL (ref 0.40–1.20)
Glucose, Bld: 85 mg/dL (ref 70–99)
HDL: 51 mg/dL (ref >39)
Hgb A1c MFr Bld: 6.9 %
LDL Cholesterol: 139 mg/dL — ABNORMAL HIGH (ref 0–99)
Potassium: 3.9 meq/L (ref 3.5–5.3)
Sodium: 141 meq/L (ref 135–145)
TSH: 2.675 microintl units/mL (ref 0.350–4.50)
Total Bilirubin: 0.6 mg/dL (ref 0.3–1.2)
Total CHOL/HDL Ratio: 4.1
Total Protein: 7.6 g/dL (ref 6.0–8.3)
Triglycerides: 104 mg/dL (ref <150)
Uric Acid, Serum: 9 mg/dL — ABNORMAL HIGH (ref 2.4–7.0)
VLDL: 21 mg/dL (ref 0–40)

## 2008-02-17 ENCOUNTER — Telehealth (INDEPENDENT_AMBULATORY_CARE_PROVIDER_SITE_OTHER): Payer: Self-pay | Admitting: *Deleted

## 2008-03-12 ENCOUNTER — Encounter: Admission: RE | Admit: 2008-03-12 | Discharge: 2008-03-12 | Payer: Self-pay | Admitting: Family Medicine

## 2008-03-31 ENCOUNTER — Encounter: Payer: Self-pay | Admitting: Family Medicine

## 2008-05-05 ENCOUNTER — Ambulatory Visit: Payer: Self-pay | Admitting: Cardiovascular Disease

## 2008-07-01 ENCOUNTER — Telehealth: Payer: Self-pay | Admitting: *Deleted

## 2008-08-13 ENCOUNTER — Ambulatory Visit: Payer: Self-pay | Admitting: Family Medicine

## 2008-08-13 ENCOUNTER — Encounter (INDEPENDENT_AMBULATORY_CARE_PROVIDER_SITE_OTHER): Payer: Self-pay | Admitting: Family Medicine

## 2008-08-13 DIAGNOSIS — J029 Acute pharyngitis, unspecified: Secondary | ICD-10-CM | POA: Insufficient documentation

## 2008-08-13 LAB — CONVERTED CEMR LAB
Bilirubin Urine: NEGATIVE
Blood in Urine, dipstick: NEGATIVE
Glucose, Urine, Semiquant: NEGATIVE
Ketones, urine, test strip: NEGATIVE
Nitrite: NEGATIVE
Protein, U semiquant: NEGATIVE
Rapid Strep: NEGATIVE
Specific Gravity, Urine: 1.02
Urobilinogen, UA: 0.2
pH: 6

## 2008-08-16 LAB — CONVERTED CEMR LAB
ALT: 27 units/L (ref 0–35)
AST: 19 units/L (ref 0–37)
Albumin: 4 g/dL (ref 3.5–5.2)
Alkaline Phosphatase: 64 units/L (ref 39–117)
BUN: 13 mg/dL (ref 6–23)
CO2: 26 meq/L (ref 19–32)
Calcium: 9.5 mg/dL (ref 8.4–10.5)
Chloride: 104 meq/L (ref 96–112)
Creatinine, Ser: 0.62 mg/dL (ref 0.40–1.20)
Glucose, Bld: 120 mg/dL — ABNORMAL HIGH (ref 70–99)
Potassium: 4 meq/L (ref 3.5–5.3)
Sodium: 140 meq/L (ref 135–145)
Total Bilirubin: 0.4 mg/dL (ref 0.3–1.2)
Total Protein: 7.2 g/dL (ref 6.0–8.3)

## 2008-10-01 ENCOUNTER — Ambulatory Visit: Payer: Self-pay | Admitting: Family Medicine

## 2008-10-01 LAB — CONVERTED CEMR LAB
Bilirubin Urine: NEGATIVE
Blood in Urine, dipstick: NEGATIVE
Glucose, Urine, Semiquant: NEGATIVE
Hgb A1c MFr Bld: 6.7 %
Ketones, urine, test strip: NEGATIVE
Nitrite: NEGATIVE
Protein, U semiquant: NEGATIVE
Specific Gravity, Urine: 1.025
Urobilinogen, UA: 0.2
pH: 5.5

## 2008-10-04 ENCOUNTER — Telehealth: Payer: Self-pay | Admitting: Family Medicine

## 2008-10-04 ENCOUNTER — Encounter: Admission: RE | Admit: 2008-10-04 | Discharge: 2008-10-04 | Payer: Self-pay | Admitting: Family Medicine

## 2008-10-04 LAB — CONVERTED CEMR LAB
ALT: 21 units/L (ref 0–35)
AST: 16 units/L (ref 0–37)
Albumin: 4.1 g/dL (ref 3.5–5.2)
Alkaline Phosphatase: 67 units/L (ref 39–117)
BUN: 16 mg/dL (ref 6–23)
CO2: 27 meq/L (ref 19–32)
Calcium: 9.4 mg/dL (ref 8.4–10.5)
Chloride: 105 meq/L (ref 96–112)
Creatinine, Ser: 0.71 mg/dL (ref 0.40–1.20)
Direct LDL: 155 mg/dL — ABNORMAL HIGH
Glucose, Bld: 129 mg/dL — ABNORMAL HIGH (ref 70–99)
Potassium: 4 meq/L (ref 3.5–5.3)
Sodium: 141 meq/L (ref 135–145)
Total Bilirubin: 0.3 mg/dL (ref 0.3–1.2)
Total Protein: 7.2 g/dL (ref 6.0–8.3)
Vit D, 25-Hydroxy: 8 ng/mL — ABNORMAL LOW (ref 30–89)

## 2008-10-06 ENCOUNTER — Encounter: Payer: Self-pay | Admitting: Family Medicine

## 2008-11-02 DIAGNOSIS — R079 Chest pain, unspecified: Secondary | ICD-10-CM | POA: Insufficient documentation

## 2008-11-02 DIAGNOSIS — R609 Edema, unspecified: Secondary | ICD-10-CM | POA: Insufficient documentation

## 2008-11-02 DIAGNOSIS — R002 Palpitations: Secondary | ICD-10-CM | POA: Insufficient documentation

## 2008-11-04 ENCOUNTER — Ambulatory Visit: Payer: Self-pay | Admitting: Cardiovascular Disease

## 2009-02-02 ENCOUNTER — Telehealth: Payer: Self-pay | Admitting: Family Medicine

## 2009-02-02 ENCOUNTER — Ambulatory Visit: Payer: Self-pay | Admitting: Family Medicine

## 2009-02-02 LAB — CONVERTED CEMR LAB: Hgb A1c MFr Bld: 7.3 %

## 2009-02-03 LAB — CONVERTED CEMR LAB
ALT: 16 units/L (ref 0–35)
AST: 11 units/L (ref 0–37)
Albumin: 4.3 g/dL (ref 3.5–5.2)
Alkaline Phosphatase: 59 units/L (ref 39–117)
BUN: 18 mg/dL (ref 6–23)
CO2: 24 meq/L (ref 19–32)
Calcium: 10.1 mg/dL (ref 8.4–10.5)
Chloride: 103 meq/L (ref 96–112)
Cholesterol: 233 mg/dL — ABNORMAL HIGH (ref 0–200)
Creatinine, Ser: 0.57 mg/dL (ref 0.40–1.20)
Glucose, Bld: 156 mg/dL — ABNORMAL HIGH (ref 70–99)
HDL: 52 mg/dL (ref >39)
LDL Cholesterol: 160 mg/dL — ABNORMAL HIGH (ref 0–99)
Potassium: 4.3 meq/L (ref 3.5–5.3)
Sodium: 140 meq/L (ref 135–145)
TSH: 5.572 microintl units/mL — ABNORMAL HIGH (ref 0.350–4.500)
Total Bilirubin: 0.4 mg/dL (ref 0.3–1.2)
Total CHOL/HDL Ratio: 4.5
Total Protein: 7.6 g/dL (ref 6.0–8.3)
Triglycerides: 104 mg/dL (ref <150)
VLDL: 21 mg/dL (ref 0–40)

## 2009-03-15 ENCOUNTER — Telehealth: Payer: Self-pay | Admitting: Family Medicine

## 2009-03-21 ENCOUNTER — Telehealth: Payer: Self-pay | Admitting: Family Medicine

## 2009-04-12 ENCOUNTER — Encounter: Admission: RE | Admit: 2009-04-12 | Discharge: 2009-04-12 | Payer: Self-pay | Admitting: Family Medicine

## 2009-06-06 DIAGNOSIS — Z860100 Personal history of colon polyps, unspecified: Secondary | ICD-10-CM

## 2009-06-06 HISTORY — DX: Personal history of colon polyps, unspecified: Z86.0100

## 2009-06-06 HISTORY — DX: Personal history of colonic polyps: Z86.010

## 2009-07-18 ENCOUNTER — Telehealth: Payer: Self-pay | Admitting: Family Medicine

## 2009-07-19 ENCOUNTER — Telehealth: Payer: Self-pay | Admitting: Family Medicine

## 2009-07-27 ENCOUNTER — Ambulatory Visit: Payer: Self-pay | Admitting: Family Medicine

## 2009-07-27 LAB — CONVERTED CEMR LAB
ALT: 16 units/L (ref 0–35)
AST: 9 units/L (ref 0–37)
Albumin: 4.2 g/dL (ref 3.5–5.2)
Alkaline Phosphatase: 61 units/L (ref 39–117)
BUN: 20 mg/dL (ref 6–23)
CO2: 21 meq/L (ref 19–32)
Calcium: 9.6 mg/dL (ref 8.4–10.5)
Chloride: 104 meq/L (ref 96–112)
Creatinine, Ser: 0.6 mg/dL (ref 0.40–1.20)
Direct LDL: 115 mg/dL — ABNORMAL HIGH
Glucose, Bld: 168 mg/dL — ABNORMAL HIGH (ref 70–99)
Hgb A1c MFr Bld: 7.5 %
Potassium: 4.1 meq/L (ref 3.5–5.3)
Sodium: 139 meq/L (ref 135–145)
TSH: 0.861 microintl units/mL (ref 0.350–4.500)
Total Bilirubin: 0.3 mg/dL (ref 0.3–1.2)
Total Protein: 7.3 g/dL (ref 6.0–8.3)

## 2009-08-10 ENCOUNTER — Ambulatory Visit: Payer: Self-pay | Admitting: Cardiovascular Disease

## 2009-08-19 ENCOUNTER — Encounter (INDEPENDENT_AMBULATORY_CARE_PROVIDER_SITE_OTHER): Payer: Self-pay | Admitting: *Deleted

## 2009-10-19 ENCOUNTER — Telehealth: Payer: Self-pay | Admitting: Family Medicine

## 2009-10-19 ENCOUNTER — Encounter: Payer: Self-pay | Admitting: Family Medicine

## 2009-10-19 ENCOUNTER — Ambulatory Visit: Payer: Self-pay | Admitting: Family Medicine

## 2009-10-19 ENCOUNTER — Encounter: Admission: RE | Admit: 2009-10-19 | Discharge: 2009-10-19 | Payer: Self-pay | Admitting: Family Medicine

## 2009-12-14 ENCOUNTER — Encounter: Payer: Self-pay | Admitting: Family Medicine

## 2009-12-23 ENCOUNTER — Telehealth: Payer: Self-pay | Admitting: Family Medicine

## 2010-03-20 ENCOUNTER — Encounter: Payer: Self-pay | Admitting: Family Medicine

## 2010-03-27 ENCOUNTER — Telehealth: Payer: Self-pay | Admitting: Family Medicine

## 2010-03-28 ENCOUNTER — Other Ambulatory Visit: Payer: Self-pay | Admitting: Family Medicine

## 2010-03-28 ENCOUNTER — Encounter (INDEPENDENT_AMBULATORY_CARE_PROVIDER_SITE_OTHER): Payer: Self-pay | Admitting: *Deleted

## 2010-03-28 DIAGNOSIS — Z1239 Encounter for other screening for malignant neoplasm of breast: Secondary | ICD-10-CM

## 2010-03-28 NOTE — Letter (Signed)
Summary: Advance Beneficiary Notice  Advance Beneficiary Notice   Imported By: Bradly Bienenstock 10/09/2007 15:45:20  _____________________________________________________________________  External Attachment:    Type:   Image     Comment:   External Document

## 2010-03-28 NOTE — Progress Notes (Signed)
Summary: Rx  Phone Note Refill Request Call back at Home Phone [phone] Message from:  Patient  Refills Requested: Medication #1:  HYDROCHLOROTHIAZIDE 25 MG TABS Take 1 tablet by mouth once a day  Medication #2:  JANUVIA 100 MG TABS Take 1 tablet by mouth once a day  Medication #3:  ONETOUCH TEST  STRP 1 strip three times a day Needs [name] to fax new rx's to [facility] [phone], is completely out and [facility] [place] [phone] for the mean time, doesn't need onetouch [facility] just [facility].  Initial call taken by: Haydee Salter,  September 22, 2007 10:54 AM      Prescriptions: HYDROCHLOROTHIAZIDE 25 MG TABS (HYDROCHLOROTHIAZIDE) Take 1 tablet by mouth once a day  #30 x 0   Entered by:   Theresia Lo RN   Authorized by:   Doralee Albino MD   Signed by:   Theresia Lo RN on 09/22/2007   Method used:   [facility] ...       [facility]  [address] [phone]*       [address]       [place], [place]  [address]       Ph: [phone]       Fax: [phone]   RxID:   [id number] JANUVIA 100 MG TABS (SITAGLIPTIN PHOSPHATE) Take 1 tablet by mouth once a day  #30 x 0   Entered by:   Theresia Lo RN   Authorized by:   Doralee Albino MD   Signed by:   Theresia Lo RN on 09/22/2007   Method used:   Electronically sent to ...       [facility]  [address] [phone]*       [address]       [place], [place]  [address]       Ph: [phone]       Fax: [phone]   RxID:   [id number]  [facility]  [address] [facility] one month supply. attempted to call patient and message left to return call. office visit is needed. will continue to try to contact patient to advise of this. will send message to Dr.Charlena Haub to ask if he wants to refill for [facility] or if patient must be seen first. Theresia Lo RN  September 22, 2007 5:08 PM

## 2010-03-28 NOTE — Progress Notes (Signed)
Summary: Medication  Phone Note Call from Patient Call back at [phone]   Reason for Call: Talk to Nurse Summary of Call: pt is out of genuvia, she is requesting samples, pt is also requesting an Rx for allegra, she sts the doctor gave that to her before, pt is breaking out in hives Initial call taken by: ERIN LEVAN,  August 09, 2006 11:44 AM  Follow-up for Phone Call        wants mail order refill on januvia & HCTZ, also wants allegra ordered. she will call back with name of mail order pharmacy & mg of januvia. message to md Follow-up by: Golden Circle RN,  August 09, 2006 11:59 AM  Additional Follow-up for Phone Call Additional follow up Details #1::        Once I have particulars, I'll Rx via DrFirst Additional Follow-up by: Doralee Albino MD,  August 09, 2006 12:28 PM   Additional Follow-up for Phone Call Additional follow up Details #2::    pt calling back, she is requesting an Rx for genuvia 100 mgs to be [facility] [facility] one month and then another rx sent to [facility] for 90 day supply. Pt [facility] the allegra [facility] Follow-up by: ERIN LEVAN,  August 12, 2006 9:47 AM  Additional Follow-up for Phone Call Additional follow up Details #3:: Details for Additional Follow-up Action Taken: Done except I used loratidine (Claritin) rather than allegra to save her money Additional Follow-up by: Doralee Albino MD,  August 13, 2006 9:35 AM  Phone call to pt - she said she would actually rather have allegra sent to [facility] [place] because it helps her with her hives.  Will forward to Dr. Leveda Anna. ..................................................................Marland KitchenAMY MARTIN RN  August 13, 2006 12:22 PM  Done via DrFirst

## 2010-03-28 NOTE — Progress Notes (Signed)
Summary: Courtney Pena  Phone Note Call from Patient Call back at Work Phone [phone]   Caller: Patient Summary of Call: PT IS THINKS SHE HAS KIDNEY STONE AND IS EXPERIENCING SOME RIGHT SIDE AND BACK PAIN THAT CAN INTENSE AT TIMES.  WOULD LIKE TO KNOW IF SHE SHOULD BE SEEN OR WAIT ON DR HENSEL TO SEE HER ON MONDAY Initial call taken by: Dedra Skeens CMA,,  February 17, 2008 10:26 AM  Follow-up for Phone Call        OK to wait as long as no fever or signs of infection.  Must be seen immediately if fever.  OK to use pain med.  Please print Hydrocodone/APAP Rx and fax to pharm.  I don't think I am in the office next week.  I think I am on the inpatient service.  Will likely need to be seen by someone else. Follow-up by: Doralee Albino MD,  February 17, 2008 12:34 PM  Additional Follow-up for Phone Call Additional follow up Details #1::        lm on vm stating message from Dr Leveda Anna an advised pt to call me to give pharmacy so I can fax pain script  Will await return call from pt. Additional Follow-up by: Dedra Skeens CMA,,  February 17, 2008 2:12 PM    Additional Follow-up for Phone Call Additional follow up Details #2::    PT RX S[facility]X [facility] [phone].  PT NOTIFIED VIA PHONE WILL CALL OFFICE TO SCHEDULE APPT IF PAIN PERSIST AND AN ONSET OF FEVER.  ADVISE TO CALL ME WITH ANY FUTURE QUESTIONS OR CONCERNS. Follow-up by: Dedra Skeens CMA,,  February 17, 2008 3:22 PM  New/Updated Medications: HYDROCODONE-ACETAMINOPHEN 5-325 MG TABS (HYDROCODONE-ACETAMINOPHEN) 1 by mouth up to 4 times per day as needed for pain   Prescriptions: HYDROCODONE-ACETAMINOPHEN 5-325 MG TABS (HYDROCODONE-ACETAMINOPHEN) 1 by mouth up to 4 times per day as needed for pain  #30 x 0   Entered by:   Dedra Skeens CMA,   Authorized by:   Doralee Albino MD   Signed by:   Dedra Skeens CMA, on 02/17/2008   Method used:   Printed then faxed to ...       [facility]  [id number] (retail)      [address]       [place], [place]  [address]       Ph: [phone] or [phone]       Fax: [phone]   RxID:   [id number] HYDROCODONE-ACETAMINOPHEN 5-325 MG TABS (HYDROCODONE-ACETAMINOPHEN) 1 by mouth up to 4 times per day as needed for pain  #30 x 0   Entered by:   Eather Colas PATE CMA,   Authorized by:   Doralee Albino MD   Signed by:   Dedra Skeens CMA, on 02/17/2008   Method used:   Print then Give to Patient   RxID:   [id number] HYDROCODONE-ACETAMINOPHEN 5-325 MG TABS (HYDROCODONE-ACETAMINOPHEN) 1 by mouth up to 4 times per day as needed for pain  #30 x 0   Entered and Authorized by:   Doralee Albino MD   Signed by:   Doralee Albino MD on 02/17/2008   Method used:   Printed then faxed to ...       [facility]  [place] (retail)       [address]       [place], [place]  [address]       Ph: [phone] or [phone]       Fax:  [phone]   RxID:   [id number]

## 2010-03-28 NOTE — Consult Note (Signed)
Summary: ENT  ENT   Imported By: Haydee Salter 06/10/2006 15:26:59  _____________________________________________________________________  External Attachment:    Type:   Image     Comment:   External Document

## 2010-03-28 NOTE — Procedures (Signed)
Summary: Colon   Colonoscopy  Procedure date:  11/15/2003  Findings:      Location:  [facility].   NAME:  Courtney Pena, Courtney Pena     ACCOUNT NO.:  [id number]   MEDICAL RECORD NO.:  [id number]          PATIENT TYPE:  AMB   LOCATION:  ENDO                         FACILITY:  [facility]   PHYSICIAN:  Anselmo Rod, M.D.  DATE OF BIRTH:  Mar 09, 1940   DATE OF PROCEDURE:  11/15/2003  DATE OF DISCHARGE:  11/15/2003                                 OPERATIVE REPORT   PROCEDURE PERFORMED:  Screening colonoscopy.   ENDOSCOPIST:  Charna Elizabeth, M.D.   INSTRUMENT USED:  Olympus video colonoscope.   INDICATIONS FOR PROCEDURE:  The patient is a 70 year old African-American  female with a history of blood in stool.  Rule out colonic polyps, masses,  etc.   PREPROCEDURE PREPARATION:  Informed consent was procured from the patient.  The patient was fasted for eight hours prior to the procedure and prepped  with a bottle of magnesium citrate and a gallon of GoLYTELY the night prior  to the procedure.   PREPROCEDURE PHYSICAL:  The patient had stable vital signs.  Neck supple.  Chest clear to auscultation.  S1 and S2 regular.  Abdomen soft with normal  bowel sounds.   DESCRIPTION OF PROCEDURE:  The patient was placed in left lateral decubitus  position and sedated with 100 mg of Demerol and 10 mg of Versed in slow  incremental doses.  Once the patient was adequately sedated and maintained  on low flow oxygen and continuous cardiac monitoring, the Olympus video  colonoscope was advanced from the rectum to the cecum.  Except for internal  and external hemorrhoids, no other abnormalities were noted.  No masses,  polyps, erosions, ulcerations or diverticula were seen.  The procedure was  complete up to the cecum.  The appendicular orifice and ileocecal valve were  clearly visualized and photographed.  The patient tolerated the procedure  well without complication.   IMPRESSION:   Unrevealing colonoscopy up to the cecum except or small  internal and external hemorrhoids.   RECOMMENDATIONS:  1.  Continue high fiber diet with liberal fluid intake.  2.  Repeat colonoscopy is recommended in the next five years unless the      patient develops any abnormal symptoms in the interim.  3.  Outpatient followup in the next two weeks or earlier if need be.       JNM/MEDQ  D:  11/17/2003  T:  11/18/2003  Job:  [id number]   cc:   William A. Leveda Anna, M.D.  Fax: [phone]

## 2010-03-28 NOTE — Consult Note (Signed)
Summary: [facility]   Imported By: De Nurse 12/22/2009 12:21:31  _____________________________________________________________________  External Attachment:    Type:   Image     Comment:   External Document

## 2010-03-28 NOTE — Assessment & Plan Note (Signed)
Summary: cpp/pap/el   Vital Signs:  Patient Profile:   70 Years Old Female Height:     65.5 inches Weight:      277.8 pounds BMI:     45.69 Temp:     97.5 degrees F Pulse rate:   75 / minute BP sitting:   137 / 86  Pt. in pain?   no  Vitals Entered By: Altamese Dilling CMA, (December 09, 2006 1:23 PM)                  Chief Complaint:  CPE/Pap.  History of Present Illness: 1. DM control poor - A1C=9.1  Will work on diet and exercise.  Will recheck in 3 months.  If not better, will start insulin.  Had good eye exam 2 weeks ago.    2. Obesity, Wt loss noted. She has been following diet better.  No sig increase in exercise.  3. Mammogram - has scheduled Had colonoscopy 10/05 Had normal pap last year at age 71- no more paps unless sx. Flu shot needs one Needs booster pneumovax Tetanus is up to date Never had zostavax  4. Has had fleeting Lt temporal pain.  No visual changes, present x 3 weeks  Call results to [phone]  OK to leave message on voice mail    Current Allergies: ACTOS (PIOGLITAZONE HCL) AMOXICILLIN (AMOXICILLIN) COZAAR (LOSARTAN POTASSIUM) LISINOPRIL (LISINOPRIL) SULFAMETHOXAZOLE (SULFAMETHOXAZOLE)  Past Medical History:    Reviewed history from 05/20/2006 and no changes required:       ANA uncertain significance,        atypical chest pain       , minimal CAD by cath 5/04, nl EF,        spinal stenosis by MRI, sx poorly correlate 12/03       Has sleep apnea.  Need device given 10/08   Past Surgical History:    Reviewed history from 04/25/2006 and no changes required:       ABI=0.87 (mild obstruction) - 05/01/2001, Barium Enema -, bilateral carpal tunnel surgery in 2003 - 04/21/2002, bone scan -, breast biopsy 1985 -, Cardiac Cath - 07/14/2002, Cardiac Cath 1999 normal -, CXR -, ECG -, Echo -, Lipid TC=211, LDL=130, HDL=54, TG=134  01/29/06 - 62/02/3084, PFT -, Xray - Spine -   Family History:    Reviewed history from 04/25/2006 and no changes  required:       - Ca, CVA, Etohism, + CAD, DM, meningioma  Social History:    Reviewed history from 05/20/2006 and no changes required:       nonsmoker, nondrinker;        healthy diet; no exercise;        works in Insurance account manager     Physical Exam  General:     Well-developed,obese,in no acute distress; alert,appropriate and cooperative throughout examination Head:     Normocephalic and atraumatic without obvious abnormalities. No apparent alopecia or balding. Neck:     No deformities, masses, or tenderness noted. Lungs:     Normal respiratory effort, chest expands symmetrically. Lungs are clear to auscultation, no crackles or wheezes. Heart:     Normal rate and regular rhythm. S1 and S2 normal without gallop, murmur, click, rub or other extra sounds. Abdomen:     Bowel sounds positive,abdomen soft and non-tender without masses, organomegaly or hernias noted.    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 Take 1 tablet by mouth once a day    Glipizide 10 Mg Tb24 (Glipizide) .Marland Kitchen... Take 1 tablet by mouth once a day    Januvia 100 Mg Tabs (Sitagliptin phosphate) .Marland Kitchen... Take 1 tablet by mouth once a day    Metformin Hcl 1000 Mg Tabs (Metformin hcl) ..... One by mouth two times a day  Orders: A1C-[facility] ([address]) [facility]- Est  Level 4 ([address])   Problem # 2:  OBESITY, NOS (ICD-278.00) Good wt loss  Problem # 3:  HYPERCHOLESTEROLEMIA (ICD-272.0)  Her updated medication list for this problem includes:    Crestor 10 Mg Tabs (Rosuvastatin calcium) .Marland Kitchen... Take 1 tablet by mouth once a day  Orders: Lipid-[facility] ([address]-91478) Comp Met-[facility] (29562-13086) TSH-[facility] ([address]-96295) [facility]- Est  Level 4 ([address])   Problem # 4:  SYMPTOM, HEADACHE (ICD-784.0)  Her updated medication list for this problem includes:    Bayer Childrens  Aspirin 81 Mg Chew (Aspirin) .Marland Kitchen... Take 1 tablet by mouth once a day    Diclofenac Sodium Cr 100 Mg Tb24 (Diclofenac sodium) .Marland Kitchen... Take 1 tablet by mouth once a day prn    Metoprolol Succinate 25 Mg Tb24 (Metoprolol succinate) ..... One tab daily  Orders: Sed Rate (ESR)-[facility] [phone])   Complete Medication List: 1)  Bayer Childrens Aspirin 81 Mg Chew (Aspirin) .... Take 1 tablet by mouth once a day 2)  Crestor 10 Mg Tabs (Rosuvastatin calcium) .... Take 1 tablet by mouth once a day 3)  Diclofenac Sodium Cr 100 Mg Tb24 (Diclofenac sodium) .... Take 1 tablet by mouth once a day prn 4)  Fexofenadine Hcl 180 Mg Tabs (Fexofenadine hcl) .... Take 1 tablet by mouth once a day as needed hives 5)  Fluticasone Propionate 50 Mcg/act Susp (Fluticasone propionate) .... Spray 2 spray into both nostrils once a day 6)  Glipizide 10 Mg Tb24 (Glipizide) .... Take 1 tablet by mouth once a day 7)  Hydrochlorothiazide 25 Mg Tabs (Hydrochlorothiazide) .... Take 1 tablet by mouth once a day 8)  Januvia 100 Mg Tabs (Sitagliptin phosphate) .... Take 1 tablet by mouth once a day 9)  Magnebind 300 250-300 Mg Tabs (Calcium carb-magnesium carb) .... Take 1 tablet by mouth once a day 10)  Meclizine Hcl 25 Mg Tabs (Meclizine hcl) .... Take 1 tablet by mouth every six hours as needed dizziness 11)  Metformin Hcl 1000 Mg Tabs (Metformin hcl) .... One by mouth two times a day 12)  Norvasc 10 Mg Tabs (Amlodipine besylate) .Marland Kitchen.. 1 tablet by mouth once a day 13)  Onetouch Test Strp (Glucose blood) .Marland Kitchen.. 1 strip three times a day 14)  Ranitidine Hcl 150 Mg Caps (Ranitidine hcl) .... Take 1 capsule by mouth once a day as needed heartburn 15)  Synthroid 100 Mcg Tabs (Levothyroxine sodium) .Marland Kitchen.. 1 tablet by mouth once a day 16)  Metoprolol Succinate 25 Mg Tb24 (Metoprolol succinate) .... One tab daily 17)  Betamethasone Dipropionate 0.05 % Crea (Betamethasone dipropionate) .... Apply to affected area two times a day do not use on face.   30 gm tube  Other Orders: Pneumococcal Vaccine (02725) Flu Vaccine 50yrs + (36644) Admin 1st Vaccine (03474) Admin of Any Addtl Vaccine (25956)   Patient Instructions: 1)  Please schedule a follow-up appointment in 3 months. 2)  Remember, insulin if diabetes has no progress. 3)  Check with insurer to see if they cover Zostavax (shingles vaccine)    Prescriptions: BETAMETHASONE DIPROPIONATE 0.05 %  CREA (  BETAMETHASONE DIPROPIONATE) apply to affected area two times a day Do not use on face.  30 gm tube  #1 x 6   Entered and Authorized by:   Doralee Albino MD   Signed by:   Doralee Albino MD on 12/09/2006   Method used:   [facility] ...       [facility]  [address]  [facility]       [address]       [place], [place]  [address]       Ph: [phone] or [phone]       Fax: [phone]   RxID:   [id number] METOPROLOL SUCCINATE 25 MG  TB24 (METOPROLOL SUCCINATE) one tab daily  #90 x 3   Entered and Authorized by:   Doralee Albino MD   Signed by:   Doralee Albino MD on 12/09/2006   Method used:   Print then Give to Patient   RxID:   [id number] ONETOUCH TEST  STRP (GLUCOSE BLOOD) 1 strip three times a day  #270 x 3   Entered and Authorized by:   Doralee Albino MD   Signed by:   Doralee Albino MD on 12/09/2006   Method used:   Print then Give to Patient   RxID:   [id number]  ] Laboratory Results   Blood Tests   Date/Time Received: December 09, 2006 1:27 PM  Date/Time Reported: December 09, 2006 1:34 PM   HGBA1C: 9.1%   (Normal Range: Non-Diabetic - 3-6%   Control Diabetic - 6-8%)  Comments: ....................test performed by Altamese Dilling, CMA     Pneumovax Vaccine    Vaccine Type: Pneumovax    Site: right deltoid    Mfr: Merck    Dose: 0.5 ml    Route: IM    Given by: Lillia Pauls CMA    Exp. Date: 08/28/2007    Lot #: [id number]    VIS given: 09/24/95 version given December 09, 2006.  Influenza Vaccine    Vaccine Type: Fluvax  3+    Site: left deltoid    Mfr: A[employer]is Pasteur    Dose: 0.5 ml    Route: IM    Given by: Lillia Pauls CMA    Exp. Date: 08/26/2007    Lot #: [id number]    VIS given: 08/25/04 version given December 09, 2006.  Flu Vaccine Consent Questions    Do you have a history of severe allergic reactions to this vaccine? no    Any prior history of allergic reactions to egg and/or gelatin? no    Do you have a sensitivity to the preservative Thimersol? no    Do you have a past history of Guillan-Barre Syndrome? no    Do you currently have an acute febrile illness? no    Have you ever had a severe reaction to latex? no    Vaccine information given and explained to patient? yes    Are you currently pregnant? no   Appended Document: sedrate report    Lab Visit   Laboratory Results   Blood Tests   Date/Time Received: December 09, 2006 2.48  PM  Date/Time Reported: December 09, 2006 5:41 PM   SED rate: 65 mm/hr  Comments: ...............test performed by......Marland KitchenBonnie A. [place], [place] (ASCP)     Orders Today:

## 2010-03-28 NOTE — Assessment & Plan Note (Signed)
Summary: back pain   Vital Signs:  Patient Profile:   70 Years Old Female Height:     65.5 inches (166.37 cm) Pulse rate:   84 / minute BP sitting:   143 / 84  Vitals Entered By: Lillia Pauls CMA (February 10, 2007 3:27 PM)                 Chief Complaint:  UPPER LEFT BACK PAIN X ONE MONTH.  History of Present Illness: C/O Lt post thoracic back pain.  pain does not seem to be worse with breathing Pain has been present for 6-8 weeks.  Much worse the last two days.  does not seem to be muscular.  Moving lt arm/shoulder does not worse pain.    Current Allergies: ACTOS (PIOGLITAZONE HCL) AMOXICILLIN (AMOXICILLIN) COZAAR (LOSARTAN POTASSIUM) LISINOPRIL (LISINOPRIL) SULFAMETHOXAZOLE (SULFAMETHOXAZOLE)      Physical Exam  Lungs:     Normal respiratory effort, chest expands symmetrically. Lungs are clear to auscultation, no crackles or wheezes. Msk:     Some point tenderness Lt mid thoracic between spine and medial border of scapula.    Impression & Recommendations:  Problem # 1:  BACK PAIN, THORACIC REGION, LEFT (ICD-724.1) Musculoskeletal most likely, but not clear based on hx and PE.  CXR and recheck one month if still present Her updated medication list for this problem includes:    Bayer Childrens Aspirin 81 Mg Chew (Aspirin) .Marland Kitchen... Take 1 tablet by mouth once a day    Diclofenac Sodium Cr 100 Mg Tb24 (Diclofenac sodium) .Marland Kitchen... Take 1 tablet by mouth once a day prn  Orders: [facility]- Est Level  3 (99213) CXR- 2view (CXR)   Complete Medication List: 1)  Bayer Childrens Aspirin 81 Mg Chew (Aspirin) .... Take 1 tablet by mouth once a day 2)  Crestor 10 Mg Tabs (Rosuvastatin calcium) .... Take 1 tablet by mouth once a day 3)  Diclofenac Sodium Cr 100 Mg Tb24 (Diclofenac sodium) .... Take 1 tablet by mouth once a day prn 4)  Fexofenadine Hcl 180 Mg Tabs (Fexofenadine hcl) .... Take 1 tablet by mouth once a day as needed hives 5)  Fluticasone Propionate 50 Mcg/act Susp  (Fluticasone propionate) .... Spray 2 spray into both nostrils once a day 6)  Glipizide 10 Mg Tb24 (Glipizide) .... Take 1 tablet by mouth once a day 7)  Hydrochlorothiazide 25 Mg Tabs (Hydrochlorothiazide) .... Take 1 tablet by mouth once a day 8)  Januvia 100 Mg Tabs (Sitagliptin phosphate) .... Take 1 tablet by mouth once a day 9)  Magnebind 300 250-300 Mg Tabs (Calcium carb-magnesium carb) .... Take 1 tablet by mouth once a day 10)  Meclizine Hcl 25 Mg Tabs (Meclizine hcl) .... Take 1 tablet by mouth every six hours as needed dizziness 11)  Metformin Hcl 1000 Mg Tabs (Metformin hcl) .... One by mouth two times a day 12)  Norvasc 10 Mg Tabs (Amlodipine besylate) .Marland Kitchen.. 1 tablet by mouth once a day 13)  Onetouch Test Strp (Glucose blood) .Marland Kitchen.. 1 strip three times a day 14)  Ranitidine Hcl 150 Mg Caps (Ranitidine hcl) .... Take 1 capsule by mouth once a day as needed heartburn 15)  Synthroid 100 Mcg Tabs (Levothyroxine sodium) .Marland Kitchen.. 1 tablet by mouth once a day 16)  Metoprolol Succinate 25 Mg Tb24 (Metoprolol succinate) .... One tab daily 17)  Betamethasone Dipropionate 0.05 % Crea (Betamethasone dipropionate) .... Apply to affected area two times a day do not use on face.  30 gm tube  18)  Gabapentin 100 Mg Caps (Gabapentin) .... One by mouth three times a day as needed headache 19)  Gabapentin 300 Mg Tabs (Gabapentin) .... One by mouth qhs     ]

## 2010-03-28 NOTE — Miscellaneous (Signed)
Summary: [facility]  [facility]I   Imported By: Clydell Hakim 10/04/2008 14:41:21  _____________________________________________________________________  External Attachment:    Type:   Image     Comment:   External Document

## 2010-03-28 NOTE — Progress Notes (Signed)
Summary: Refill  Phone Note Call from Patient Call back at Home Phone [phone]   Summary of Call: Pt is needing a refill on metformin, she is wanting to get it where they dispense 3 months at a time.  Pt is completely out and states she called the pharmacy late last week - Pt uses [facility] on [address] [id number]. Initial call taken by: Haydee Salter,  April 23, 2007 2:12 PM  Follow-up for Phone Call        OK Follow-up by: Doralee Albino MD,  April 23, 2007 3:40 PM  Additional Follow-up for Phone Call Additional follow up Details #1::        left message for her that rx was at pharmacy Additional Follow-up by: Golden Circle RN,  April 23, 2007 4:27 PM      Prescriptions: METFORMIN HCL 1000 MG TABS (METFORMIN HCL) one by mouth two times a day  #180 x 3   Entered and Authorized by:   Doralee Albino MD   Signed by:   Doralee Albino MD on 04/23/2007   Method used:   Electronically sent to ...       [address]       [address]       [place], [place]  [address]       Ph: [phone]       Fax: [phone]   RxID:   [id number] METFORMIN HCL 1000 MG TABS (METFORMIN HCL) one by mouth two times a day  #62 x 3   Entered by:   Golden Circle RN   Authorized by:   Doralee Albino MD   Signed by:   Golden Circle RN on 04/23/2007   Method used:   Electronically sent to ...       [address]       [address]       [place], [place]  [address]       Ph: [phone]       Fax: [phone]   RxID:   [id number]

## 2010-03-28 NOTE — Assessment & Plan Note (Signed)
Summary: f/up,tcb   Vital Signs:  Patient profile:   70 year old female Weight:      266 pounds Temp:     97.5 degrees F oral Pulse rate:   72 / minute Pulse rhythm:   regular BP sitting:   168 / 95  (left arm) Cuff size:   large  Vitals Entered By: Loralee Pacas CMA (July 27, 2009 2:59 PM)  Serial Vital Signs/Assessments:  Time      Position  BP       Pulse  Resp  Temp     By                     146/86   74                    Loralee Pacas CMA  CC: follow-up visit Is Patient Diabetic? Yes   Primary Care Provider:  Doralee Albino MD  CC:  follow-up visit.  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  Had significant dietary indiscretion beginning Thanksgiving 2010 and continuing until about 1 month ago.  Now back on a regimented diet that works for her.  We discussed: DM A1C is not at goal today.  No med changes due to renewed dietary commitment HBP with home BP running from 130 - 150 systolic.  Can't take ACE. Chol.  Last LDL was 160 - markedly above goal.   Habits & Providers  Alcohol-Tobacco-Diet     Tobacco Status: never  Current Medications (verified): 1)  Bayer Childrens Aspirin 81 Mg Chew (Aspirin) .... Take 1 Tablet By Mouth Once A Day 2)  Fexofenadine Hcl 180 Mg Tabs (Fexofenadine Hcl) .... Take 1 Tablet By Mouth Once A Day As Needed Hives 3)  Glipizide 10 Mg Tb24 (Glipizide) .... Take 1 Tablet By Mouth Once A Day 4)  Hydrochlorothiazide 25 Mg Tabs (Hydrochlorothiazide) .... Take 1 Tablet By Mouth Once A Day 5)  Januvia 100 Mg Tabs (Sitagliptin Phosphate) .... Take 1 Tablet By Mouth Once A Day 6)  Meclizine Hcl 25 Mg Tabs (Meclizine Hcl) .... Take 1 Tablet By Mouth Every Six Hours As Needed Dizziness 7)  Metformin Hcl 1000 Mg Tabs (Metformin Hcl) .... One By Mouth Two Times A Day 8)  Onetouch Test  Strp (Glucose Blood) .Marland Kitchen.. 1 Strip Three Times A Day 9)  Levothyroxine Sodium 112 Mcg Tabs (Levothyroxine Sodium) .... One By Mouth  Daily 10)  Metoprolol Succinate 100 Mg Xr24h-Tab (Metoprolol Succinate) .... One By Mouth Daily 11)  Betamethasone Dipropionate Aug 0.05 % Crea (Aug Betamethasone Dipropionate) .... Apply To Affected Area Two Times A Day: Do Not Use On Face 12)  Pravastatin Sodium 20 Mg Tabs (Pravastatin Sodium) .... One By Mouth Daily  Allergies (verified): 1)  Actos (Pioglitazone Hcl) 2)  Amoxicillin (Amoxicillin) 3)  Cozaar (Losartan Potassium) 4)  Lisinopril (Lisinopril) 5)  Sulfamethoxazole (Sulfamethoxazole)  Past History:  Past medical, surgical, family and social histories (including risk factors) reviewed, and no changes noted (except as noted below).  Past Medical History: Reviewed history from 11/02/2008 and no changes required. 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 ABI=0.87 (mild obstruction) - 05/01/2001, Barium Enema -, bilateral carpal tunnel surgery in 2003 - 04/21/2002, bone scan -, breast biopsy 1985 -, Cardiac Cath -  07/14/2002, Cardiac Cath 1999 normal -, CXR -, ECG -, Echo -, Lipid TC=211, LDL=130, HDL=54, TG=134  01/29/06 - 11/03/1189, PFT -, Xray - Spine -  Family History: Reviewed history from 04/25/2006 and no changes required. - Ca, CVA, Etohism, + CAD, DM, meningioma  Social History: Reviewed history from 05/20/2006 and no changes required. nonsmoker, nondrinker;  healthy diet; no exercise;  works in [employer] Status:  never  Physical Exam  General:  Well-developed,well-nourished,in no acute distress; alert,appropriate and cooperative throughout examination.  Wt graph reviewed Lungs:  Normal respiratory effort, chest expands symmetrically. Lungs are clear to auscultation, no crackles or wheezes. Heart:  Normal rate and regular rhythm. S1 and S2 normal without gallop, murmur, click, rub or other extra sounds. Abdomen:  Bowel sounds positive,abdomen soft and non-tender without masses, organomegaly or hernias noted. Extremities:  No edema  Diabetes Management Exam:    Foot Exam (with socks and/or shoes not present):       Sensory-Pinprick/Light touch:          Left medial foot (L-4): normal          Left dorsal foot (L-5): normal          Left lateral foot (S-1): normal          Right medial foot (L-4): normal          Right dorsal foot (L-5): normal          Right lateral foot (S-1): normal       Sensory-Monofilament:          Left foot: normal          Right foot: normal       Inspection:          Left foot: normal          Right foot: normal       Nails:          Left foot: normal          Right foot: normal   Impression & Recommendations:  Problem # 1:  HYPERTENSION, BENIGN SYSTEMIC (ICD-401.1) Assessment Deteriorated  Focus on diet and increase metoprolol Her updated medication list for this problem includes:    Hydrochlorothiazide 25 Mg Tabs (Hydrochlorothiazide) .Marland Kitchen... Take 1 tablet by mouth once a day    Metoprolol Succinate 100 Mg Xr24h-tab (Metoprolol succinate)  ..... One by mouth daily  Orders: Comp Met-[facility] [id number]) [facility]- Est  Level 4 (99214)  BP today: 168/95 Prior BP: 176/94 (02/02/2009)  Labs Reviewed: K+: 4.3 (02/02/2009) Creat: : 0.57 (02/02/2009)   Chol: 233 (02/02/2009)   HDL: 52 (02/02/2009)   LDL: 160 (02/02/2009)   TG: 104 (02/02/2009)  Problem # 2:  HYPERCHOLESTEROLEMIA (ICD-272.0)  Baseline LDL and start low dose statin which she seems to have previously tolerated. Her updated medication list for this problem includes:    Pravastatin Sodium 20 Mg Tabs (Pravastatin sodium) ..... One by mouth daily  Orders: Direct LDL-[facility] [phone]) 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 Take 1 tablet by mouth once a day    Glipizide 10 Mg Tb24 (Glipizide) .Marland Kitchen... Take 1 tablet by mouth once a day    Januvia 100 Mg Tabs (Sitagliptin phosphate) .Marland Kitchen... Take 1 tablet by mouth once a day    Metformin Hcl 1000 Mg Tabs (Metformin hcl) ..... One by mouth two times a day  Orders: A1C-[facility] (95621) [facility]- Est  Level 4 (30865)  Labs Reviewed: Creat: 0.57 (02/02/2009)     Last Eye Exam: normal (06/28/2008) Reviewed HgBA1c results: 7.5 (07/27/2009)  7.3 (02/02/2009)  Problem # 4:  HYPOTHYROIDISM, UNSPECIFIED (ICD-244.9)  Recheck TSH since dose of levothyroxine increased last visit. Her updated medication list for this problem includes:    Levothyroxine Sodium 112 Mcg Tabs (Levothyroxine sodium) ..... One by mouth daily  Orders: TSH-[facility] [phone]) [facility]- Est  Level 4 (84132)  Labs Reviewed: TSH: 5.572 (02/02/2009)    HgBA1c: 7.5  (07/27/2009) Chol: 233 (02/02/2009)   HDL: 52 (02/02/2009)   LDL: 160 (02/02/2009)   TG: 104 (02/02/2009)  Complete Medication List: 1)  Bayer Childrens Aspirin 81 Mg Chew (Aspirin) .... Take 1 tablet by mouth once a day 2)  Fexofenadine Hcl 180 Mg Tabs (Fexofenadine hcl) .... Take 1 tablet by mouth once a day as needed hives 3)  Glipizide 10 Mg Tb24 (Glipizide) .... Take 1 tablet by mouth once a day 4)  Hydrochlorothiazide 25 Mg Tabs (Hydrochlorothiazide) .... Take 1 tablet by mouth once a day 5)  Januvia 100 Mg Tabs (Sitagliptin phosphate) .... Take 1 tablet by mouth once a day 6)  Meclizine Hcl 25 Mg Tabs (Meclizine hcl) .... Take 1 tablet by mouth every six hours as needed dizziness 7)  Metformin Hcl 1000 Mg Tabs (Metformin hcl) .... One by mouth two times a day 8)  Onetouch Test Strp (Glucose blood) .Marland Kitchen.. 1 strip three times a day 9)  Levothyroxine Sodium 112 Mcg Tabs (Levothyroxine sodium) .... One by mouth daily 10)  Metoprolol Succinate 100 Mg Xr24h-tab (Metoprolol succinate) .... One by mouth daily 11)  Betamethasone Dipropionate Aug 0.05 % Crea (Aug betamethasone dipropionate) .... Apply to affected area two times a day: do not use on face 12)  Pravastatin Sodium 20 Mg Tabs (Pravastatin sodium) .... One by mouth daily  Patient Instructions: 1)  Please schedule a follow-up appointment in 1 month.  Prescriptions: METOPROLOL SUCCINATE 100 MG XR24H-TAB (METOPROLOL SUCCINATE) one by mouth daily  #90 x 3   Entered and Authorized by:   Doralee Albino MD   Signed by:   Doralee Albino MD on 07/27/2009   Method used:   Electronically to        [facility] [phone]* (retail)       [address]       [place], [place]  [address]       Ph: [phone]       Fax: [phone]   RxID:   [id number] PRAVASTATIN SODIUM 20 MG TABS (PRAVASTATIN SODIUM) one by mouth daily  #30 x 12   Entered and Authorized by:   Doralee Albino  MD   Signed by:   Doralee Albino MD on 07/27/2009   Method  used:   Electronically to        [id number] (retail)       [address]       [place], [place]  [address]       Ph: [phone] or [phone]       Fax: [phone]   RxID:   [id number]    Prevention & Chronic Care Immunizations   Influenza vaccine: Historical  (01/12/2009)   Influenza vaccine due: 10/27/2009    Tetanus booster: 11/26/2000: Done.   Tetanus booster due: 11/27/2010    Pneumococcal vaccine: Pneumovax  (12/09/2006)   Pneumococcal vaccine due: None    H. zoster vaccine: 10/01/2007: Zostavax  Colorectal Screening   Hemoccult: Done.  (08/26/2001)   Hemoccult due: Not Indicated    Colonoscopy: Done.  (11/27/2003)   Colonoscopy due: 11/26/2013  Other Screening   Pap smear: Done.  (10/27/2005)   Pap smear due: Not Indicated    Mammogram: ASSESSMENT: Negative - BI-RADS 1^MM DIGITAL SCREENING  (04/12/2009)   Mammogram due: 04/12/2010    DXA bone density scan: Not documented   DXA scan due: Not Indicated    Smoking status: never  (07/27/2009)  Diabetes Mellitus   HgbA1C: 7.5  (07/27/2009)   Hemoglobin A1C due: 01/01/2008    Eye exam: normal  (06/28/2008)   Eye exam due: 06/2009    Foot exam: yes  (07/27/2009)   High risk foot: Not documented   Foot care education: Not documented    Urine microalbumin/creatinine ratio: Not documented   Urine microalbumin action/deferral: Not indicated    Diabetes flowsheet reviewed?: Yes   Progress toward A1C goal: Unchanged  Lipids   Total Cholesterol: 233  (02/02/2009)   LDL: 160  (02/02/2009)   LDL Direct: 155  (10/01/2008)   HDL: 52  (02/02/2009)   Triglycerides: 104  (02/02/2009)    SGOT (AST): 11  (02/02/2009)   SGPT (ALT): 16  (02/02/2009) CMP ordered    Alkaline phosphatase: 59  (02/02/2009)   Total bilirubin: 0.4  (02/02/2009)    Lipid flowsheet reviewed?: Yes   Progress toward LDL goal: Deteriorated  Hypertension   Last Blood Pressure: 168 / 95  (07/27/2009)   Serum  creatinine: 0.57  (02/02/2009)   Serum potassium 4.3  (02/02/2009) CMP ordered     Hypertension flowsheet reviewed?: Yes   Progress toward BP goal: Deteriorated  Self-Management Support :   Personal Goals (by the next clinic visit) :     Personal A1C goal: 7  (02/02/2009)     Personal blood pressure goal: 130/80  (02/02/2009)     Personal LDL goal: 100  (02/02/2009)    Diabetes self-management support: Written self-care plan  (07/27/2009)   Diabetes care plan printed    Hypertension self-management support: Written self-care plan  (07/27/2009)   Hypertension self-care plan printed.    Lipid self-management support: Written self-care plan  (07/27/2009)   Lipid self-care plan printed.  Laboratory Results   Blood Tests   Date/Time Received: July 27, 2009 2:55 PM  Date/Time Reported: July 27, 2009 3:17 PM   HGBA1C: 7.5%   (Normal Range: Non-Diabetic - 3-6%   Control Diabetic - 6-8%)  Comments: ...........test performed by...........Marland KitchenTerese Door, CMA

## 2010-03-28 NOTE — Assessment & Plan Note (Signed)
Summary: fu wp   Vital Signs:  Patient Profile:   69 Years Old Female Height:     65.5 inches (166.37 cm) Temp:     98.2 degrees F (36.78 degrees C) Pulse rate:   74 / minute BP sitting:   148 / 93  (right arm)  Pt. in pain?   no  Vitals Entered By: Tomasa Rand (December 30, 2006 2:15 PM)                  Chief Complaint:  F/U from last visit and labs results.  History of Present Illness: Here for increased Chol.  Was taking 1/2 crestor.  Spoke at length about diet, statins and side effects.  She will take crestor 10 mg daily.  Recheck dLDL and CMP in 3 monthws.  Also still has left temporal headache.  Sed rate was 65.    Office number is [phone]  Current Allergies: ACTOS (PIOGLITAZONE HCL) AMOXICILLIN (AMOXICILLIN) COZAAR (LOSARTAN POTASSIUM) LISINOPRIL (LISINOPRIL) SULFAMETHOXAZOLE (SULFAMETHOXAZOLE)      Physical Exam  General:     Well-developed,well-nourished,in no acute distress; alert,appropriate and cooperative throughout examination Head:     no palpable lt temporal artery pain or swelling    Impression & Recommendations:  Problem # 1:  HYPERCHOLESTEROLEMIA (ICD-272.0) See HPI Her updated medication list for this problem includes:    Crestor 10 Mg Tabs (Rosuvastatin calcium) .Marland Kitchen... Take 1 tablet by mouth once a day  Orders: [facility]- Est Level  3 (88416)   Problem # 2:  SYMPTOM, HEADACHE (ICD-784.0) Repeat sed rate  If remains high refer to general surg for temporal artery biopsy Her updated medication list for this problem includes:    Bayer Childrens Aspirin 81 Mg Chew (Aspirin) .Marland Kitchen... Take 1 tablet by mouth once a day    Diclofenac Sodium Cr 100 Mg Tb24 (Diclofenac sodium) .Marland Kitchen... Take 1 tablet by mouth once a day prn    Metoprolol Succinate 25 Mg Tb24 (Metoprolol succinate) ..... One tab daily  Orders: Sed Rate (ESR)-[facility] [phone]) [facility]- Est Level  3 (16010)   Complete Medication List: 1)  Bayer Childrens Aspirin 81 Mg Chew (Aspirin)  .... Take 1 tablet by mouth once a day 2)  Crestor 10 Mg Tabs (Rosuvastatin calcium) .... Take 1 tablet by mouth once a day 3)  Diclofenac Sodium Cr 100 Mg Tb24 (Diclofenac sodium) .... Take 1 tablet by mouth once a day prn 4)  Fexofenadine Hcl 180 Mg Tabs (Fexofenadine hcl) .... Take 1 tablet by mouth once a day as needed hives 5)  Fluticasone Propionate 50 Mcg/act Susp (Fluticasone propionate) .... Spray 2 spray into both nostrils once a day 6)  Glipizide 10 Mg Tb24 (Glipizide) .... Take 1 tablet by mouth once a day 7)  Hydrochlorothiazide 25 Mg Tabs (Hydrochlorothiazide) .... Take 1 tablet by mouth once a day 8)  Januvia 100 Mg Tabs (Sitagliptin phosphate) .... Take 1 tablet by mouth once a day 9)  Magnebind 300 250-300 Mg Tabs (Calcium carb-magnesium carb) .... Take 1 tablet by mouth once a day 10)  Meclizine Hcl 25 Mg Tabs (Meclizine hcl) .... Take 1 tablet by mouth every six hours as needed dizziness 11)  Metformin Hcl 1000 Mg Tabs (Metformin hcl) .... One by mouth two times a day 12)  Norvasc 10 Mg Tabs (Amlodipine besylate) .Marland Kitchen.. 1 tablet by mouth once a day 13)  Onetouch Test Strp (Glucose blood) .Marland Kitchen.. 1 strip three times a day 14)  Ranitidine Hcl 150 Mg Caps (Ranitidine  hcl) .... Take 1 capsule by mouth once a day as needed heartburn 15)  Synthroid 100 Mcg Tabs (Levothyroxine sodium) .Marland Kitchen.. 1 tablet by mouth once a day 16)  Metoprolol Succinate 25 Mg Tb24 (Metoprolol succinate) .... One tab daily 17)  Betamethasone Dipropionate 0.05 % Crea (Betamethasone dipropionate) .... Apply to affected area two times a day do not use on face.  30 gm tube   Patient Instructions: 1)  FU by phone.  Look up temporal arteritis or giant cell arteritis    ]  Appended Document: ESR RESULTS    Lab Visit   Laboratory Results   Blood Tests   Date/Time Received: December 30, 2006 2:51 PM  Date/Time Reported: December 30, 2006 4:07 PM   SED rate: 48 mm/hr  Comments:  ...................................................................DONNA LORING  December 30, 2006 4:07 PM    Doubt temporal arteritis given sig drop in sed rate.  Will follow.  Patient notified. Orders Today:

## 2010-03-28 NOTE — Assessment & Plan Note (Signed)
Summary: fu wp   Vital Signs:  Patient Profile:   70 Years Old Female Height:     65.5 inches (166.37 cm) Weight:      273.7 pounds BMI:     45.02 Temp:     98 degrees F Pulse rate:   77 / minute BP sitting:   138 / 84  (left arm)  Pt. in pain?   yes    Location:   sinus    Intensity:   3    Type:       pressure  Vitals Entered By: Theresia Lo RN (October 01, 2007 8:58 AM)              Is Patient Diabetic? Yes     Last Flex Sig:  Done. (11/27/2003 12:00:00 AM) Flex Sig Next Due:  Not Indicated Last Hemoccult Result: Done. (08/26/2001 12:00:00 AM) Hemoccult Next Due:  Not Indicated Last PAP:  Done. (10/27/2005 12:00:00 AM) PAP Next Due:  Not Indicated Bone Density Next Due: Not Indicated   Chief Complaint:  routine check up.  History of Present Illness: Has joined a wellness group at with several church members - only on it for 2 weeks.  Updated med list.  Only taking crestor once a week.  It affects me.    Call results at work number - ok to leave message on that line  Explained cardiac risk factors. 1. DM control is terrible 2. Weight is slightly better, but BMI still in marked obesity range. 3. DM control is terrible 4. BP control is suboptimal  Her agenda is to take less meds.    Current Allergies: ACTOS (PIOGLITAZONE HCL) AMOXICILLIN (AMOXICILLIN) COZAAR (LOSARTAN POTASSIUM) LISINOPRIL (LISINOPRIL) SULFAMETHOXAZOLE (SULFAMETHOXAZOLE)      Physical Exam  General:     Well-developed,well-nourished,in no acute distress; alert,appropriate and cooperative throughout examination Lungs:     Normal respiratory effort, chest expands symmetrically. Lungs are clear to auscultation, no crackles or wheezes. Heart:     Normal rate and regular rhythm. S1 and S2 normal without gallop, murmur, click, rub or other extra sounds. Extremities:     No clubbing, cyanosis, edema, or deformity noted with normal full range of motion of all joints.       Impression & Recommendations:  Problem # 1:  HYPERTENSION, BENIGN SYSTEMIC (ICD-401.1) suboptimal control see instructions Her updated medication list for this problem includes:    Hydrochlorothiazide 25 Mg Tabs (Hydrochlorothiazide) .Marland Kitchen... Take 1 tablet by mouth once a day    Norvasc 10 Mg Tabs (Amlodipine besylate) .Marland Kitchen... 1 tablet by mouth once a day    Metoprolol Succinate 25 Mg Tb24 (Metoprolol succinate) ..... One tab daily  Orders: [facility]- Est  Level 4 (52841)   Problem # 2:  HYPERCHOLESTEROLEMIA (ICD-272.0) Terrible control last time checked.   Her updated medication list for this problem includes:    Crestor 10 Mg Tabs (Rosuvastatin calcium) .Marland Kitchen... 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 Take 1 tablet by mouth once a day    Glipizide 10 Mg Tb24 (Glipizide) .Marland Kitchen... Take 1 tablet by mouth once a day    Januvia 100 Mg Tabs (Sitagliptin phosphate) .Marland Kitchen... Take 1 tablet by mouth once a day  Metformin Hcl 1000 Mg Tabs (Metformin hcl) ..... One by mouth two times a day  Orders: A1C-[facility] (40347) [facility]- Est  Level 4 (42595)   Problem # 4:  OBESITY, NOS (ICD-278.00)  Complete Medication List: 1)  Bayer Childrens Aspirin 81 Mg Chew (Aspirin) .... Take 1 tablet by mouth once a day 2)  Crestor 10 Mg Tabs (Rosuvastatin calcium) .... Take 1 tablet by mouth once a day 3)  Diclofenac Sodium Cr 100 Mg Tb24 (Diclofenac sodium) .... Take 1 tablet by mouth once a day prn 4)  Fexofenadine Hcl 180 Mg Tabs (Fexofenadine hcl) .... Take 1 tablet by mouth once a day as needed hives 5)  Glipizide 10 Mg Tb24 (Glipizide) .... Take 1 tablet by mouth once a day 6)  Hydrochlorothiazide 25 Mg Tabs  (Hydrochlorothiazide) .... Take 1 tablet by mouth once a day 7)  Januvia 100 Mg Tabs (Sitagliptin phosphate) .... Take 1 tablet by mouth once a day 8)  Meclizine Hcl 25 Mg Tabs (Meclizine hcl) .... Take 1 tablet by mouth every six hours as needed dizziness 9)  Metformin Hcl 1000 Mg Tabs (Metformin hcl) .... One by mouth two times a day 10)  Norvasc 10 Mg Tabs (Amlodipine besylate) .Marland Kitchen.. 1 tablet by mouth once a day 11)  Onetouch Test Strp (Glucose blood) .Marland Kitchen.. 1 strip three times a day 12)  Synthroid 100 Mcg Tabs (Levothyroxine sodium) .Marland Kitchen.. 1 tablet by mouth once a day 13)  Metoprolol Succinate 25 Mg Tb24 (Metoprolol succinate) .... One tab daily 14)  Betamethasone Dipropionate 0.05 % Crea (Betamethasone dipropionate) .... Apply to affected area two times a day do not use on face.  30 gm tube  Other Orders: Zoster (Shingles) Vaccine Live (63875) Admin 1st Vaccine (64332)   Patient Instructions: 1)  diet and exercise 2)  FU 2-3 months - get flu shot at that visit.  If DM control not improved, begin on insulin.   3)  I'oll call with cholesterol results.     Prescriptions: ONETOUCH TEST  STRP (GLUCOSE BLOOD) 1 strip three times a day  #270 x 3   Entered and Authorized by:   Doralee Albino MD   Signed by:   Doralee Albino MD on 10/01/2007   Method used:   Faxed to ...       [facility]             , [place]         Ph:        Fax: [phone]   RxID:   [id number]  ] Laboratory Results   Blood Tests   Date/Time Received: October 01, 2007 9:16 AM  Date/Time Reported: October 01, 2007 9:51 AM   HGBA1C: 10.2%   (Normal Range: Non-Diabetic - 3-6%   Control Diabetic - 6-8%)  Comments: ...............test performed by......Marland KitchenBonnie A. [place], [place] (ASCP)      Zostavax # 1    Vaccine Type: Zostavax    Site: right arm    Mfr: Merck    Dose: 0.5 ml    Route: [place]    Given by: Theresia Lo RN    Exp. Date: 12/12/2008    Lot #: [id number]    VIS given: 12/08/04 given October 01, 2007.

## 2010-03-28 NOTE — Assessment & Plan Note (Signed)
Summary: fu/el   Vital Signs:  Patient Profile:   70 Years Old Female Weight:      288 pounds Pulse rate:   85 / minute BP sitting:   137 / 85  Vitals Entered By: Lillia Pauls CMA (August 26, 2006 4:23 PM)               Chief Complaint:  DM FU.  History of Present Illness: Feels OK.  Not quite as tired.  knows she has been eating the wrong things.  Has been taking all meds which were reviewed in [facility].  On three drugs,  diet and weight are the main issues.    Past Medical History:    Reviewed history from 05/20/2006 and no changes required:       ANA uncertain significance,        atypical chest pain       , minimal CAD by cath 5/04, nl EF,        spinal stenosis by MRI, sx poorly correlate 12/03  Past Surgical History:    Reviewed history from 04/25/2006 and no changes required:       ABI=0.87 (mild obstruction) - 05/01/2001, Barium Enema -, bilateral carpal tunnel surgery in 2003 - 04/21/2002, bone scan -, breast biopsy 1985 -, Cardiac Cath - 07/14/2002, Cardiac Cath 1999 normal -, CXR -, ECG -, Echo -, Lipid TC=211, LDL=130, HDL=54, TG=134  01/29/06 - 16/02/958, PFT -, Xray - Spine -   Family History:    Reviewed history from 04/25/2006 and no changes required:       - Ca, CVA, Etohism, + CAD, DM, meningioma  Social History:    Reviewed history from 05/20/2006 and no changes required:       nonsmoker, nondrinker;        healthy diet; no exercise;        works in [employer] account manager     Physical Exam  General:     overweight-appearing.  NAD Lungs:     Normal respiratory effort, chest expands symmetrically. Lungs are clear to auscultation, no crackles or wheezes. Heart:     Normal rate and regular rhythm. S1 and S2 normal without gallop, murmur, click, rub or other extra sounds. Abdomen:     Bowel sounds positive,abdomen soft and non-tender without masses, organomegaly or hernias noted.    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 Serious discussion.  Next step is insulin Orders: A1C-[facility] (45409) [facility]- Est  Level 4 (81191)    Patient Instructions: 1)  Please schedule a follow-up appointment in 3 months.  If HgbA1C not significantly improved, will begin on lantus     Laboratory Results   Blood Tests   Date/Time Recieved: August 26, 2006 4:25 PM  Date/Time Reported: August 26, 2006 4:36 PM   HGBA1C: 9.4%   (Normal Range: Non-Diabetic - 3-6%   Control Diabetic - 6-8%)  Comments: ...............test performed by......Marland KitchenBonnie A. [place], [place] (ASCP)

## 2010-03-28 NOTE — Letter (Signed)
Summary: Generic Letter  [facility]  [address]   [place], [place] [address]   Phone: [phone]  Fax: [phone]    09/24/2007  SARAJANE FAMBROUGH [address] [place], [place]  [address]  Dear [name],       I have been unable to contact you by phone. Dr. Leveda Anna did send your prescriptions to [facility] as you requested . He  would like for you to schedule an appointment soon ,as you are overdue for a check up. Please call our office to schedule appointment at [phone].        Thank you.       Sincerely,   Jessieca Rhem RN [facility]

## 2010-03-28 NOTE — Assessment & Plan Note (Signed)
Summary: high bps & HA x 1 wk/   Vital Signs:  Patient profile:   70 year old female Height:      65 inches Weight:      259 pounds BMI:     43.26 Temp:     98 degrees F oral Pulse rate:   67 / minute BP sitting:   176 / 94  (left arm) Cuff size:   large  Vitals Entered By: Tessie Fass CMA (February 02, 2009 11:21 AM) CC: High BP. Palpitations Is Patient Diabetic? Yes Pain Assessment Patient in pain? yes     Location: head Intensity: 2   Primary Care Provider:  Doralee Albino MD  CC:  High BP. Palpitations.  History of Present Illness: BP has been up.  Was losing weight nicely I can leave lab results on either home or work voicemail - try work first. Due for cholesterol check. Headaches - but feels related to BP  Habits & Providers  Alcohol-Tobacco-Diet     Tobacco Status: quit  Current Medications (verified): 1)  Bayer Childrens Aspirin 81 Mg Chew (Aspirin) .... Take 1 Tablet By Mouth Once A Day 2)  Diclofenac Sodium Cr 100 Mg Tb24 (Diclofenac Sodium) .... Take 1 Tablet  Prn 3)  Fexofenadine Hcl 180 Mg Tabs (Fexofenadine Hcl) .... Take 1 Tablet By Mouth Once A Day As Needed Hives 4)  Glipizide 10 Mg Tb24 (Glipizide) .... Take 1 Tablet By Mouth Once A Day 5)  Hydrochlorothiazide 25 Mg Tabs (Hydrochlorothiazide) .... Take 1 Tablet By Mouth Once A Day 6)  Januvia 100 Mg Tabs (Sitagliptin Phosphate) .... Take 1 Tablet By Mouth Once A Day 7)  Meclizine Hcl 25 Mg Tabs (Meclizine Hcl) .... Take 1 Tablet By Mouth Every Six Hours As Needed Dizziness 8)  Metformin Hcl 1000 Mg Tabs (Metformin Hcl) .... One By Mouth Two Times A Day 9)  Onetouch Test  Strp (Glucose Blood) .Marland Kitchen.. 1 Strip Three Times A Day 10)  Synthroid 100 Mcg Tabs (Levothyroxine Sodium) .Marland Kitchen.. 1 Tablet By Mouth Once A Day 11)  Metoprolol Succinate 50 Mg Xr24h-Tab (Metoprolol Succinate) .... One Daily 12)  Betamethasone Dipropionate Aug 0.05 % Crea (Aug Betamethasone Dipropionate) .... Apply To Affected Area  Two Times A Day: Do Not Use On Face  Allergies (verified): 1)  Actos (Pioglitazone Hcl) 2)  Amoxicillin (Amoxicillin) 3)  Cozaar (Losartan Potassium) 4)  Lisinopril (Lisinopril) 5)  Sulfamethoxazole (Sulfamethoxazole)  Past History:  Past medical, surgical, family and social histories (including risk factors) reviewed, and no changes noted (except as noted below).  Past Medical History: Reviewed history from 11/02/2008 and no changes required. 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 ABI=0.87 (mild obstruction) - 05/01/2001, Barium Enema -, bilateral carpal tunnel surgery in 2003 - 04/21/2002, bone scan -,  breast biopsy 1985 -, Cardiac Cath - 07/14/2002, Cardiac Cath 1999 normal -, CXR -, ECG -, Echo -, Lipid TC=211, LDL=130, HDL=54, TG=134  01/29/06 - 16/02/958, PFT -, Xray - Spine -  Family History: Reviewed history from 04/25/2006 and no changes required. - Ca, CVA, Etohism, + CAD, DM, meningioma  Social  History: Reviewed history from 05/20/2006 and no changes required. nonsmoker, nondrinker;  healthy diet; no exercise;  works in [employer] Status:  quit  Physical Exam  General:  Well-developed,well-nourished,in no acute distress; alert,appropriate and cooperative throughout examination Lungs:  Normal respiratory effort, chest expands symmetrically. Lungs are clear to auscultation, no crackles or wheezes. Heart:  Normal rate and regular rhythm. S1 and S2 normal without gallop, murmur, click, rub or other extra sounds.  Diabetes Management Exam:    Eye Exam:       Eye Exam done elsewhere          Date: 06/28/2008          Results: normal          Done by: uncertain   Impression & Recommendations:  Problem # 1:  HYPERTENSION, BENIGN SYSTEMIC (ICD-401.1) Assessment Deteriorated  Increase metoprolol.  Add amlodipine if this does not work.  Refocus on diet. Her updated medication list for this problem includes:    Hydrochlorothiazide 25 Mg Tabs (Hydrochlorothiazide) .Marland Kitchen... Take 1 tablet by mouth once a day    Metoprolol Succinate 50 Mg Xr24h-tab (Metoprolol succinate) ..... One daily  Orders: [facility]- Est  Level 4 (45409)  Problem # 2:  HYPERCHOLESTEROLEMIA (ICD-272.0) Status uncertain, check labs. Orders: Lipid-[facility] ([id number]) Comp Met-[facility] ([id number]) [facility]- Est  Level 4 (84696)  Problem # 3:  HYPOTHYROIDISM, UNSPECIFIED (ICD-244.9) Status uncertain, check labs. Her updated medication list for this problem includes:    Synthroid 100 Mcg Tabs (Levothyroxine sodium) .Marland Kitchen... 1 tablet by mouth once a day  Orders: TSH-[facility] ([id number]) [facility]- Est  Level 4 (40102)  Complete Medication List: 1)  Bayer Childrens Aspirin 81 Mg Chew (Aspirin) .... Take 1 tablet by mouth once a day 2)  Diclofenac Sodium Cr 100 Mg Tb24 (Diclofenac sodium) .... Take 1 tablet  prn 3)  Fexofenadine Hcl 180 Mg Tabs (Fexofenadine hcl) .... Take 1 tablet by mouth once a day as needed hives 4)   Glipizide 10 Mg Tb24 (Glipizide) .... Take 1 tablet by mouth once a day 5)  Hydrochlorothiazide 25 Mg Tabs (Hydrochlorothiazide) .... Take 1 tablet by mouth once a day 6)  Januvia 100 Mg Tabs (Sitagliptin phosphate) .... Take 1 tablet by mouth once a day 7)  Meclizine Hcl 25 Mg Tabs (Meclizine hcl) .... Take 1 tablet by mouth every six hours as needed dizziness 8)  Metformin Hcl 1000 Mg Tabs (Metformin hcl) .... One by mouth two times a day 9)  Onetouch Test Strp (Glucose blood) .Marland Kitchen.. 1 strip three times a day 10)  Synthroid 100 Mcg Tabs (Levothyroxine sodium) .Marland Kitchen.. 1 tablet by mouth once a day 11)  Metoprolol Succinate 50 Mg Xr24h-tab (Metoprolol succinate) .... One daily 12)  Betamethasone Dipropionate Aug 0.05 % Crea (Aug betamethasone dipropionate) .... Apply to affected area two times a day: do not use on face  Other Orders: A1C-[facility] (72536)  Patient Instructions: 1)  Take two of metoprolol daily (total 50 mg).   2)  Check blood pressure regularly 3)  If still elevated, call me and I will restart amlodipine.   Immunization History:  Influenza Immunization History:    Influenza:  historical (01/12/2009)  Prevention & Chronic Care Immunizations   Influenza vaccine: Historical  (01/12/2009)   Influenza vaccine due: 10/27/2009    Tetanus booster: 11/26/2000: Done.   Tetanus booster due: 11/27/2010    Pneumococcal vaccine: Pneumovax  (12/09/2006)   Pneumococcal vaccine due: None    H. zoster vaccine: 10/01/2007: Zostavax  Colorectal Screening   Hemoccult: Done.  (08/26/2001)   Hemoccult due: Not Indicated    Colonoscopy: Done.  (11/27/2003)   Colonoscopy due: 11/26/2013  Other Screening   Pap smear: Done.  (10/27/2005)   Pap smear due: Not Indicated    Mammogram: ASSESSMENT: Negative - BI-RADS 1^MM DIGITAL SCREENING  (03/12/2008)   Mammogram due: 03/12/2009    DXA bone density scan: Not documented   DXA scan due: Not Indicated    Smoking status: quit   (02/02/2009)  Diabetes Mellitus   HgbA1C: 7.3  (02/02/2009)   Hemoglobin A1C due: 01/01/2008    Eye exam: normal  (06/28/2008)   Eye exam due: 06/2009    Foot exam: Not documented   High risk foot: Not documented   Foot care education: Not documented    Urine microalbumin/creatinine ratio: Not documented   Urine microalbumin action/deferral: Not indicated    Diabetes flowsheet reviewed?: Yes   Progress toward A1C goal: At goal  Lipids   Total Cholesterol: 211  (12/19/2007)   LDL: 139  (12/19/2007)   LDL Direct: 155  (10/01/2008)   HDL: 51  (12/19/2007)   Triglycerides: 104  (12/19/2007)    SGOT (AST): 16  (10/01/2008)   SGPT (ALT): 21  (10/01/2008) CMP ordered    Alkaline phosphatase: 67  (10/01/2008)   Total bilirubin: 0.3  (10/01/2008)    Lipid flowsheet reviewed?: Yes   Progress toward LDL goal: Unchanged  Hypertension   Last Blood Pressure: 176 / 94  (02/02/2009)   Serum creatinine: 0.71  (10/01/2008)   Serum potassium 4.0  (10/01/2008) CMP ordered     Hypertension flowsheet reviewed?: Yes   Progress toward BP goal: Deteriorated  Self-Management Support :   Personal Goals (by the next clinic visit) :     Personal A1C goal: 7  (02/02/2009)     Personal blood pressure goal: 130/80  (02/02/2009)     Personal LDL goal: 100  (02/02/2009)    Diabetes self-management support: Written self-care plan  (02/02/2009)   Diabetes care plan printed    Hypertension self-management support: Written self-care plan  (02/02/2009)   Hypertension self-care plan printed.    Lipid self-management support: Written self-care plan  (02/02/2009)   Lipid self-care plan printed.   Nursing Instructions: Diabetic foot exam today      Laboratory Results   Blood Tests   Date/Time Received: February 02, 2009 11:26 AM  Date/Time Reported: February 02, 2009 12:09 PM   HGBA1C: 7.3%   (Normal Range: Non-Diabetic - 3-6%   Control Diabetic - 6-8%)   Comments: ...............test performed by......Marland KitchenBonnie A. [place], MLS (ASCP)cm

## 2010-03-28 NOTE — Consult Note (Signed)
Summary: [facility]  [facility]   Imported By: Knox Royalty 06/12/2007 08:54:33  _____________________________________________________________________  External Attachment:    Type:   Image     Comment:   External Document

## 2010-03-28 NOTE — Progress Notes (Signed)
Summary: Rx Req  Medications Added DICLOFENAC SODIUM CR 100 MG TB24 (DICLOFENAC SODIUM) Take 1 tablet daily as needed arthritis pains. MECLIZINE HCL 25 MG TABS (MECLIZINE HCL) Take 1 tablet by mouth every six hours as needed dizziness METOPROLOL SUCCINATE 50 MG XR24H-TAB (METOPROLOL SUCCINATE) one daily       Phone Note Refill Request Call back at Work Phone [phone] Message from:  Patient  Refills Requested: Medication #1:  METOPROLOL SUCCINATE 50 MG XR24H-TAB one daily  Medication #2:  MECLIZINE HCL 25 MG TABS Take 1 tablet by mouth every six hours as needed dizziness  Medication #3:  DICLOFENAC SODIUM CR 100 MG TB24 Take 1 tablet  prn PT [facility] [place].  SHE WAS TAKING 25 MG NOW TAKING 50 MG. WOULD LIKE 3 MO SUPPLY.  ONLY WANTS SUPPLY FOR THE METOPROLOL.  Initial call taken by: Clydell Hakim,  March 15, 2009 11:32 AM  Follow-up for Phone Call        will forward to MD. Follow-up by: Theresia Lo RN,  March 15, 2009 11:45 AM  Additional Follow-up for Phone Call Additional follow up Details #1::        Please inform patient that prescriptions sent electronically as requested. Additional Follow-up by: Doralee Albino MD,  March 15, 2009 11:53 AM    New/Updated Medications: DICLOFENAC SODIUM CR 100 MG TB24 (DICLOFENAC SODIUM) Take 1 tablet daily as needed arthritis pains. MECLIZINE HCL 25 MG TABS (MECLIZINE HCL) Take 1 tablet by mouth every six hours as needed dizziness METOPROLOL SUCCINATE 50 MG XR24H-TAB (METOPROLOL SUCCINATE) one daily Prescriptions: MECLIZINE HCL 25 MG TABS (MECLIZINE HCL) Take 1 tablet by mouth every six hours as needed dizziness  #60 x 12   Entered and Authorized by:   Doralee Albino MD   Signed by:   Doralee Albino MD on 03/15/2009   Method used:   [facility] to        [facility]  [place] (retail)       [address]       [place], [place]  [address]       Ph: [phone] or [phone]       Fax: [phone]  RxID:   [id number] DICLOFENAC SODIUM CR 100 MG TB24 (DICLOFENAC SODIUM) Take 1 tablet daily as needed arthritis pains.  #30 x 12   Entered and Authorized by:   Doralee Albino MD   Signed by:   Doralee Albino MD on 03/15/2009   Method used:   [facility]        [facility]  [place] (retail)       [address]       [place], [place]  [address]       Ph: [phone] or [phone]       Fax: [phone]   RxID:   [id number] METOPROLOL SUCCINATE 50 MG XR24H-TAB (METOPROLOL SUCCINATE) one daily  #90 x 3   Entered and Authorized by:   Doralee Albino MD   Signed by:   Doralee Albino MD on 03/15/2009   Method used:   [facility]        [facility]  [place] (retail)       [address]       [place], [place]  [address]       Ph: [phone] or [phone]       Fax: [phone]   RxID:   [id number]  message left on voicemail that rx have been sent. Theresia Lo RN  March 15, 2009 12:23 PM

## 2010-03-28 NOTE — Consult Note (Signed)
[facility]   Imported By: Denny Peon LEVAN 02/06/2007 10:44:26  _____________________________________________________________________  External Attachment:    Type:   Image     Comment:   External Document  Appended Document: [facility]    Clinical Lists Changes  Medications: Added new medication of GABAPENTIN 100 MG  CAPS (GABAPENTIN) one by mouth three times a day as needed headache Added new medication of GABAPENTIN 300 MG  TABS (GABAPENTIN) one by mouth qhs

## 2010-03-28 NOTE — Assessment & Plan Note (Signed)
Summary: sore throat/  dpg   Vital Signs:  Patient profile:   70 year old female Height:      65 inches Weight:      248.6 pounds BMI:     41.52 Temp:     97.5 degrees F oral Pulse rate:   75 / minute BP sitting:   150 / 85  (left arm) Cuff size:   large  Vitals Entered By: Dedra Skeens CMA, (August 13, 2008 11:12 AM) CC: sore throat/ right lower back pain radiating into right side Is Patient Diabetic? Yes  Pain Assessment Patient in pain? yes     Location: throat/ back6 Intensity: 6   CC:  sore throat/ right lower back pain radiating into right side.  History of Present Illness: 1)sore throat:  since yesterday.  throat feels swollen.  bilateral ear pain. no sick contacts.  no fevers.  has stuffy and runny nose.  has taken tylenol  2)side/ab pain: has had off and on for a while but got worse yesterday.  R back to R side.  Feels deep. worse w/ movement.  no pain at night.  lying down makes it better.  took diclofenac and it helped.  has flexeril but didn't take.  no dysuria  Habits & Providers  Alcohol-Tobacco-Diet     Tobacco Status: never  Allergies: 1)  Actos (Pioglitazone Hcl) 2)  Amoxicillin (Amoxicillin) 3)  Cozaar (Losartan Potassium) 4)  Lisinopril (Lisinopril) 5)  Sulfamethoxazole (Sulfamethoxazole)  Past History:  Past Medical History: Last updated: 12/09/2006 ANA uncertain significance,  atypical chest pain , minimal CAD by cath 5/04, nl EF,  spinal stenosis by MRI, sx poorly correlate 12/03 Has sleep apnea.  Need device given 10/08   Social History: Smoking Status:  never  Review of Systems      See HPI  Physical Exam  General:  Looks great and is noticably thinner Eyes:  perrla eomi Ears:  External ear exam shows no significant lesions or deformities.  Otoscopic examination reveals clear canals, tympanic membranes are intact bilaterally without bulging, retraction, inflammation or discharge. Hearing is grossly normal bilaterally. Nose:   normal Mouth:  no erythema or exudate +postnasal drip Lungs:  Normal respiratory effort, chest expands symmetrically. Lungs are clear to auscultation, no crackles or wheezes. Heart:  Normal rate and regular rhythm. S1 and S2 normal without gallop, murmur, click, rub or other extra sounds. Abdomen:  soft, mild tender to palpation RUQ.  normal BS  Msk:  mild tender to palpation R side and R lower back.  no spasm noted   Impression & Recommendations:  Problem # 1:  ABDOMINAL PAIN RIGHT UPPER QUADRANT (ICD-789.01) Assessment New unclear etiology but likely benign.  CMET to reassure not GB or liver problem.  Most likely muscle strain in of intercostal muscles as pain worsen w/ movement.  treat w/ diclofenac.  flexeril not likely beneficial.  Orders: Comp Met-[facility] ([id number]) [facility]- Est Level  3 (09811)  Problem # 2:  SORE THROAT (ICD-462) Assessment: New viral URI.  supportive care Her updated medication list for this problem includes:    Bayer Childrens Aspirin 81 Mg Chew (Aspirin) .Marland Kitchen... Take 1 tablet by mouth once a day    Diclofenac Sodium Cr 100 Mg Tb24 (Diclofenac sodium) .Marland Kitchen... Take 1 tablet by mouth once a day prn  Orders: Rapid Strep-[facility] ([id number]) [facility]- Est Level  3 (29562)  Complete Medication List: 1)  Bayer Childrens Aspirin 81 Mg Chew (Aspirin) .... Take 1 tablet by mouth once a  day 2)  Diclofenac Sodium Cr 100 Mg Tb24 (Diclofenac sodium) .... Take 1 tablet by mouth once a day prn 3)  Fexofenadine Hcl 180 Mg Tabs (Fexofenadine hcl) .... Take 1 tablet by mouth once a day as needed hives 4)  Glipizide 10 Mg Tb24 (Glipizide) .... Take 1 tablet by mouth once a day 5)  Hydrochlorothiazide 25 Mg Tabs (Hydrochlorothiazide) .... Take 1 tablet by mouth once a day 6)  Januvia 100 Mg Tabs (Sitagliptin phosphate) .... Take 1 tablet by mouth once a day 7)  Meclizine Hcl 25 Mg Tabs (Meclizine hcl) .... Take 1 tablet by mouth every six hours as needed dizziness 8)  Metformin Hcl 1000 Mg Tabs  (Metformin hcl) .... One by mouth two times a day 9)  Onetouch Test Strp (Glucose blood) .Marland Kitchen.. 1 strip three times a day 10)  Synthroid 100 Mcg Tabs (Levothyroxine sodium) .Marland Kitchen.. 1 tablet by mouth once a day 11)  Metoprolol Succinate 25 Mg Tb24 (Metoprolol succinate) .... One tab daily 12)  Hydrocodone-acetaminophen 5-325 Mg Tabs (Hydrocodone-acetaminophen) .Marland Kitchen.. 1 by mouth up to 4 times per day as needed for pain 13)  Betamethasone Dipropionate Aug 0.05 % Crea (Aug betamethasone dipropionate) .... Apply to affected area two times a day: do not use on face  Other Orders: Urinalysis-[facility] (00000)  Patient Instructions: 1)  f/u w/ Dr Leveda Anna if not improved 2)  YOU CAN TAKE THE DICLOFENAC FOR PAIN IF NEEDED 3)  If you labs are abnormal, someone from office will call you.  If they are normal, you will receive a letter.  4)  cyclobenzeprine will only  help if you are having muscle spasm.  Laboratory Results   Urine Tests  Date/Time Received: August 13, 2008 11:40 AM  Date/Time Reported: August 13, 2008 11:59 AM   Routine Urinalysis   Color: yellow Appearance: Clear Glucose: negative   (Normal Range: Negative) Bilirubin: negative   (Normal Range: Negative) Ketone: negative   (Normal Range: Negative) Spec. Gravity: 1.020   (Normal Range: 1.003-1.035) Blood: negative   (Normal Range: Negative) pH: 6.0   (Normal Range: 5.0-8.0) Protein: negative   (Normal Range: Negative) Urobilinogen: 0.2   (Normal Range: 0-1) Nitrite: negative   (Normal Range: Negative) Leukocyte Esterace: small   (Normal Range: Negative)  Urine Microscopic WBC/HPF: 0-3 RBC/HPF: rare Bacteria/HPF: trace Epithelial/HPF: 0-3    Comments: ...........test performed by...........Marland KitchenTerese Door, CMA  Date/Time Received: August 13, 2008 11:23 AM  Date/Time Reported: August 13, 2008 11:33 AM   Other Tests  Rapid Strep: negative Comments: ...........test performed by...........Marland KitchenTerese Door, CMA

## 2010-03-28 NOTE — Assessment & Plan Note (Signed)
Summary: re: right leg pain   Vital Signs:  Patient profile:   70 year old female Height:      65 inches Weight:      268 pounds BMI:     44.76 Temp:     97.8 degrees F oral Pulse rate:   82 / minute BP sitting:   158 / 94  (left arm) Cuff size:   large  Vitals Entered By: Tessie Fass CMA (October 19, 2009 11:34 AM) CC: right leg pain and burning x 3 weeks Is Patient Diabetic? Yes Pain Assessment Patient in pain? yes     Location: right leg Intensity: 4   Primary Care Provider:  Doralee Albino MD  CC:  right leg pain and burning x 3 weeks.  History of Present Illness: 70 y/o F with  DM, HTN, Hypothyroidism is here for  Right thigh pain x 3-4 weeks.  Feels like burning sensation, tenderness to touch, +warmth, -redness, -swelling, +keeps her up at night only sometimes, she is able to lie on that side to sleep but tends to prefer to left side. No injury.  Pain seems worse in evening.   Recent travel: 3 hr car trip to [place] last Thurs, but pain present prior to that. Tried for pain: aleve, advil, excedrine without much relief She read about DVT and it worried her.   Able to be as mobile as before, but when she stands from sitting position there is discomfort No urinary or bowel complaints No fever, chills Has not happened before Some pain in back of knee and knee, but not as concering as pain in thigh  **Cramping in lower legs for past two night while sleeping.  She got out of bed and walked around and cramping relieved.  When she got back to bed, cramping return.     Allergies (verified): 1)  Actos (Pioglitazone Hcl) 2)  Amoxicillin (Amoxicillin) 3)  Cozaar (Losartan Potassium) 4)  Lisinopril (Lisinopril) 5)  Sulfamethoxazole (Sulfamethoxazole)  Review of Systems       per hpi   Physical Exam  General:  Well-developed,well-nourished,in no acute distress; alert,appropriate and cooperative throughout examination. vitals reviewed.  Msk:  R Hip: ROM IR, ER  rom decreased compared to left, tenderness in hip Flexion Extension: wnl  Abduction:causes tenderness Strength IR: 5/5, ER: 5/5, Flexion: 5/5, Extension: 5/5, Abduction: 5/5, Adduction: 5/5  Strength intact, but painful Pelvic alignment unremarkable to inspection and palpation. Standing hip rotation and gait without trendelenburg / unsteadiness. Lateral thigh with generalized tenderness to keep palpaiton + tenderness over piriformis and greater trochanter. Lying Straight leg raises causes pain, no pain with sitting straight leg.  No SI joint tenderness and normal minimal SI movement.  No swelling, no redness, no inflamtion Neurologic:  alert & oriented X3.     Impression & Recommendations:  Problem # 1:  LEG PAIN, RIGHT (ICD-729.5) Assessment New Leg pain not likely DVT (pt's main concern) since there is no swelling, redness, pt not at increased risk. Pain in leg may originate in hip.  Will get hip xray.  I am concerned re arthritic process since pt has arthritis.  She is morbidly obese and this may be OA and weight is contributing to it.   Will try mobic 7.5 mg daily.  Pt to rtc in 2 wks for f/u if not better.  Orders: Radiology other (Radiology Other) [facility]- Est Level  3 ([address])  Complete Medication List: 1)  Bayer Childrens Aspirin 81 Mg Chew (Aspirin) .... Take 1  tablet by mouth once a day 2)  Fexofenadine Hcl 180 Mg Tabs (Fexofenadine hcl) .... Take 1 tablet by mouth once a day as needed hives 3)  Glipizide 10 Mg Tb24 (Glipizide) .... Take 1 tablet by mouth once a day 4)  Hydrochlorothiazide 25 Mg Tabs (Hydrochlorothiazide) .... Take 1 tablet by mouth once a day 5)  Januvia 100 Mg Tabs (Sitagliptin phosphate) .... Take 1 tablet by mouth once a day 6)  Meclizine Hcl 25 Mg Tabs (Meclizine hcl) .... Take 1 tablet by mouth every six hours as needed dizziness 7)  Metformin Hcl 1000 Mg Tabs (Metformin hcl) .... One by mouth two times a day 8)  Onetouch Test Strp (Glucose blood) .Marland Kitchen.. 1  strip three times a day 9)  Levothyroxine Sodium 112 Mcg Tabs (Levothyroxine sodium) .... One by mouth daily 10)  Metoprolol Succinate 50 Mg Xr24h-tab (Metoprolol succinate) .... Take one tablet by mouth daily 11)  Betamethasone Dipropionate Aug 0.05 % Crea (Aug betamethasone dipropionate) .... Apply to affected area two times a day: do not use on face 12)  Doxazosin Mesylate 2 Mg Tabs (Doxazosin mesylate) .Marland Kitchen.. 1 qd 13)  Mobic 7.5 Mg Tabs (Meloxicam) .Marland Kitchen.. 1 tab by mouth daily for pain  Patient Instructions: 1)  Please schedule a follow-up appointment in 2 weeks if not better.  2)  Cell [phone]: I will call you as soon as I get xray result.  3)  Mobic 7.5mg  daily for pain. Prescriptions: MOBIC 7.5 MG TABS (MELOXICAM) 1 tab by mouth daily for pain  #30 x 0   Entered and Authorized by:   Angeline Slim MD   Signed by:   Angeline Slim MD on 10/19/2009   Method used:   [facility]        [facility] [facility]retail)       [address]       [place], [place]  [address]       Ph: [phone] or [phone]       Fax: [phone]   RxID:   [id number]

## 2010-03-28 NOTE — Miscellaneous (Signed)
  Clinical Lists Changes  Medications: Added new medication of BETAMETHASONE DIPROPIONATE AUG 0.05 % CREA (AUG BETAMETHASONE DIPROPIONATE) apply to affected area two times a day: Do not use on face

## 2010-03-28 NOTE — Consult Note (Signed)
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

## 2010-03-28 NOTE — Progress Notes (Signed)
Summary: MED CHANGES  Phone Note From Other Clinic Call back at [phone]   Caller: DEBRA @ DR. NISHAN Summary of Call: WILL SEND COPY OF NUCLEAR STRESS TEST.  PT HAD SOME PAT.  WILL START HER ON TOPROL 25 MG ONCE A DAY, LISINOPRIL 10 MG ONCE A DAY, STOP NORVASC.  CALL WITH QUESTIONS Initial call taken by: Levada Schilling,  October 09, 2006 11:48 AM  Follow-up for Phone Call        I called back and spoke to [place].  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  She stated she would discuss with Dr Eden Emms and contact patient for med changes. Follow-up by: Doralee Albino MD,  October 09, 2006 12:43 PM

## 2010-03-28 NOTE — Progress Notes (Signed)
Summary: pls call  Medications Added PRAVASTATIN SODIUM 20 MG TABS (PRAVASTATIN SODIUM) one by mouth at bedtime       Phone Note Call from Patient Call back at [phone] pls page   Caller: Patient Summary of Call: pt is needing to talk to Dr Leveda Anna re: his message that he left Initial call taken by: De Nurse,  October 04, 2008 11:35 AM  Follow-up for Phone Call        Called and given lab/Xray results.  She is willing to try pravachol.  Will recheck lipids in three months on drug. Follow-up by: Doralee Albino MD,  October 04, 2008 11:50 AM    New/Updated Medications: PRAVASTATIN SODIUM 20 MG TABS (PRAVASTATIN SODIUM) one by mouth at bedtime Prescriptions: PRAVASTATIN SODIUM 20 MG TABS (PRAVASTATIN SODIUM) one by mouth at bedtime  #30 x 12   Entered and Authorized by:   Doralee Albino MD   Signed by:   Doralee Albino MD on 10/04/2008   Method used:   Electronically to        [facility] [phone]* (retail)       [address]       [place], [place]  [address]       Ph: [phone]       Fax: [phone]   RxID:   [id number]

## 2010-03-28 NOTE — Progress Notes (Signed)
Summary: Pharmacy Call  Medications Added DICLOFENAC SODIUM 75 MG TBEC (DICLOFENAC SODIUM) 1 by mouth twice daily. take with food       Phone Note Other Incoming Call back at [phone]   [facility] Summary of Call: Please call concerning a rx for this pt. Initial call taken by: Clydell Hakim,  March 21, 2009 9:30 AM  Follow-up for Phone Call        spoke with pharmacist and patient was concerned about Diclofenac RX that was sent in on 03/15/2009. patient stated she had been on 75 mg twice daily and now  rx has changed to 100 mg CR once daily. will send message to MD to clarify please. Follow-up by: Theresia Lo RN,  March 21, 2009 9:37 AM  Additional Follow-up for Phone Call Additional follow up Details #1::        Discussed with pharmacist.  New Rx called and reconciled my med list Additional Follow-up by: Doralee Albino MD,  March 21, 2009 9:44 AM    New/Updated Medications: DICLOFENAC SODIUM 75 MG TBEC (DICLOFENAC SODIUM) 1 by mouth twice daily. take with food Prescriptions: DICLOFENAC SODIUM 75 MG TBEC (DICLOFENAC SODIUM) 1 by mouth twice daily. take with food  #60 x 6   Entered and Authorized by:   Doralee Albino MD   Signed by:   Doralee Albino MD on 03/21/2009   Method used:   Handwritten   RxID:   [id number]

## 2010-03-28 NOTE — Progress Notes (Signed)
Summary: Labs  Phone Note Call from Patient Call back at Work Phone [phone]   Summary of Call: Pt is requesting to speak with someone re: labs, wants to schedule an appt with a nutritionist, but needs numbers first. Initial call taken by: Haydee Salter,  September 29, 2007 10:40 AM  Follow-up for Phone Call        called pt. LMAM with [id number], if pt wants to sched. appt with nutritionist here. also asked, to call back, if ???? Follow-up by: Arlyss Repress CMA,,  September 29, 2007 12:04 PM  Additional Follow-up for Phone Call Additional follow up Details #1::        message left again to return call Additional Follow-up by: Theresia Lo RN,  September 30, 2007 8:53 AM

## 2010-03-28 NOTE — Progress Notes (Signed)
Summary: Rx Req   Phone Note Refill Request Call back at Work Phone [phone] Message from:  Patient  Refills Requested: Medication #1:  METOPROLOL SUCCINATE 50 MG XR24H-TAB Take one tablet by mouth daily [facility].  Initial call taken by: Clydell Hakim,  December 23, 2009 10:02 AM  Follow-up for Phone Call        done Follow-up by: Doralee Albino MD,  December 23, 2009 12:15 PM    Prescriptions: METOPROLOL SUCCINATE 50 MG XR24H-TAB (METOPROLOL SUCCINATE) Take one tablet by mouth daily  #90 x 3   Entered and Authorized by:   Doralee Albino MD   Signed by:   Doralee Albino MD on 12/23/2009   Method used:   [facility]        [facility] (retail)       [address]       [place], [place]  [address]       Ph: [phone] or [phone]       Fax: [phone]   RxID:   [id number]

## 2010-03-28 NOTE — Progress Notes (Signed)
Summary: refill  Medications Added GLIPIZIDE 10 MG TB24 (GLIPIZIDE) Take 1 tablet by mouth once a day JANUVIA 100 MG TABS (SITAGLIPTIN PHOSPHATE) Take 1 tablet by mouth once a day METFORMIN HCL 1000 MG TABS (METFORMIN HCL) one by mouth two times a day       Phone Note Refill Request Call back at Work Phone [phone] Message from:  Patient  Refills Requested: Medication #1:  HYDROCHLOROTHIAZIDE 25 MG TABS Take 1 tablet by mouth once a day   Supply Requested: 3 months   Notes: [facility]  Medication #2:  GLIPIZIDE 10 MG TB24 Take 1 tablet by mouth once a day   Supply Requested: 3 months   Notes: [facility]  Medication #3:  JANUVIA 100 MG TABS Take 1 tablet by mouth once a day   Supply Requested: 3 months   Notes: [facility]  Medication #4:  LEVOTHYROXINE SODIUM 112 MCG TABS one by mouth daily   Supply Requested: 3 months   Notes: [facility] Metoprolol - [facility] 90 days- also needs 1 weeks worth to get enough until order comes in at [facility]formin - [facility] [address]   Initial call taken by: De Nurse,  Jul 18, 2009 11:14 AM  Follow-up for Phone Call        Refilled Januvia, HCTZ and glipizide via fax request from [facility].  Did not have requests for synthryoid or metoprolol.  Sent Rx to [facility] for metformin and levothyroxine.  Left message to call back and clarify if needs other Rxes.  Also LM that she is overdue for an appointment. Follow-up by: Doralee Albino MD,  Jul 19, 2009 11:42 AM    New/Updated Medications: GLIPIZIDE 10 MG TB24 (GLIPIZIDE) Take 1 tablet by mouth once a day JANUVIA 100 MG TABS (SITAGLIPTIN PHOSPHATE) Take 1 tablet by mouth once a day METFORMIN HCL 1000 MG TABS (METFORMIN HCL) one by mouth two times a day Prescriptions: LEVOTHYROXINE SODIUM 112 MCG TABS (LEVOTHYROXINE SODIUM) one by mouth daily  #30 x 2   Entered and Authorized by:   Doralee Albino MD   Signed by:   Doralee Albino MD on 07/19/2009   Method used:   Electronically to        [facility] [phone]* (retail)       [address]       [place], [place]  [address]       Ph: [phone]       Fax: [phone]   RxID:   [id number] METFORMIN HCL 1000 MG TABS (METFORMIN HCL) one by mouth two times a day  #180 x 3   Entered and Authorized by:   Doralee Albino MD   Signed by:   Doralee Albino MD on 07/19/2009   Method used:   Electronically to        [facility] [phone]* (retail)       [address]       [place], [place]  [address]       Ph: [phone]       Fax: [phone]   RxID:   [id number] HYDROCHLOROTHIAZIDE 25 MG TABS (HYDROCHLOROTHIAZIDE) Take 1 tablet by mouth once a day  #90 x 3   Entered and Authorized by:   Doralee Albino MD   Signed by:   Doralee Albino MD on 07/19/2009   Method used:   Handwritten   RxID:   [id number] JANUVIA 100 MG TABS (SITAGLIPTIN PHOSPHATE) Take 1 tablet by mouth once a day  #90 x  3   Entered and Authorized by:   Doralee Albino MD   Signed by:   Doralee Albino MD on 07/19/2009   Method used:   Handwritten   RxID:   [id number] GLIPIZIDE 10 MG TB24 (GLIPIZIDE) Take 1 tablet by mouth once a day  #90 x 3   Entered and Authorized by:   Doralee Albino MD   Signed by:   Doralee Albino MD on 07/19/2009   Method used:   Handwritten   RxID:   [id number]

## 2010-03-28 NOTE — Progress Notes (Signed)
Summary: refills needed.  Medications Added GLIPIZIDE 10 MG TB24 (GLIPIZIDE) Take 1 tablet by mouth once a day HYDROCHLOROTHIAZIDE 25 MG TABS (HYDROCHLOROTHIAZIDE) Take 1 tablet by mouth once a day JANUVIA 100 MG TABS (SITAGLIPTIN PHOSPHATE) Take 1 tablet by mouth once a day ONETOUCH TEST  STRP (GLUCOSE BLOOD) 1 strip three times a day SYNTHROID 100 MCG TABS (LEVOTHYROXINE SODIUM) 1 tablet by mouth once a day METOPROLOL SUCCINATE 25 MG  TB24 (METOPROLOL SUCCINATE) one tab daily       Phone Note Call from Patient   Summary of Call: patient calls requesting refills on  meds .will send  message  to MD . needs sent to [facility] . their phone number is [phone]. needs Synthroid 100 mg , HCTZ 25 mg, Janivua100 mg,  Metroprol 25 mg , Glipizide XL 10 mg . strips for one touch  ultra. checks blood sugar three times a day. will ask MD to please print out and sign so we can fax. Initial call taken by: Theresia Lo RN,  Jul 01, 2008 4:18 PM  Follow-up for Phone Call        done Follow-up by: Doralee Albino MD,  Jul 02, 2008 8:49 AM    New/Updated Medications: GLIPIZIDE 10 MG TB24 (GLIPIZIDE) Take 1 tablet by mouth once a day HYDROCHLOROTHIAZIDE 25 MG TABS (HYDROCHLOROTHIAZIDE) Take 1 tablet by mouth once a day JANUVIA 100 MG TABS (SITAGLIPTIN PHOSPHATE) Take 1 tablet by mouth once a day ONETOUCH TEST  STRP (GLUCOSE BLOOD) 1 strip three times a day SYNTHROID 100 MCG TABS (LEVOTHYROXINE SODIUM) 1 tablet by mouth once a day METOPROLOL SUCCINATE 25 MG  TB24 (METOPROLOL SUCCINATE) one tab daily   Prescriptions: METOPROLOL SUCCINATE 25 MG  TB24 (METOPROLOL SUCCINATE) one tab daily  #90 x 3   Entered and Authorized by:   Doralee Albino MD   Signed by:   Doralee Albino MD on 07/02/2008   Method used:   Printed then [facility]ed [facility] ...       [facility] (retail)       [address]       [place], [place]  [address]       Ph: [phone] or [phone]       Fax: [phone]  RxID:   [id number] ONETOUCH TEST  STRP (GLUCOSE BLOOD) 1 strip three times a day  #270 x 3   Entered and Authorized by:   Doralee Albino MD   Signed by:   Doralee Albino MD on 07/02/2008   Method used:   Printed then faxed to ...       [facility]* (retail)       [address]       [place], [place]  [address]       Ph: [phone] or [phone]       Fax: [phone]   RxID:   [id number] GLIPIZIDE 10 MG TB24 (GLIPIZIDE) Take 1 tablet by mouth once a day  #90 x 3   Entered and Authorized by:   Doralee Albino MD   Signed by:   Doralee Albino MD on 07/02/2008   Method used:   Printed then faxed to ...       [facility] (retail)       [address]       [place], [place]  [address]       Ph: [phone] or [phone]       Fax: [phone]   RxID:  [id number] JANUVIA 100 MG TABS (SITAGLIPTIN PHOSPHATE) Take 1 tablet by mouth once a day  #90 x 3   Entered and Authorized by:   Doralee Albino MD   Signed by:   Doralee Albino MD on 07/02/2008   Method used:   Printed then faxed to ...       [facility] (retail)       [address]       [place], [place]  [address]       Ph: [phone] or [phone]       Fax: [phone]   RxID:   [id number] HYDROCHLOROTHIAZIDE 25 MG TABS (HYDROCHLOROTHIAZIDE) Take 1 tablet by mouth once a day  #90 x 3   Entered and Authorized by:   Doralee Albino MD   Signed by:   Doralee Albino MD on 07/02/2008   Method used:   Printed then faxed to ...       [facility] (retail)       [address]       [place], [place]  [address]       Ph: [phone] or [phone]       Fax: [phone]   RxID:   [id number] SYNTHROID 100 MCG TABS (LEVOTHYROXINE SODIUM) 1 tablet by mouth once a day  #90 x 3   Entered and Authorized by:   Doralee Albino MD   Signed by:   Doralee Albino MD on 07/02/2008   Method used:   Printed then faxed to ...       [facility] (retail)       [address]       [place], [place]  [address]       Ph: [phone] or [phone]       Fax: [phone]   RxID:   [id number]  rx faxed to [facility] [phone].  message left for patient that meds have been faxed. Theresia Lo RN  Jul 02, 2008 9:04 AM

## 2010-03-28 NOTE — Miscellaneous (Signed)
Summary: GI records   Clinical Lists Changes  Observations: Added new observation of EGD: small hiatal hernia.  Otherwise normal (05/07/2007 10:58) Added new observation of COLONOSCOPY: Normal except small hemorrhoids (11/15/2003 10:59) Added new observation of EGD: Normal except small hiatal hernia and small intestine polyp (biopsy benign.) (09/24/2003 10:56)      EGD  Procedure date:  09/24/2003  Findings:      Normal except small hiatal hernia and small intestine polyp (biopsy benign.)  EGD  Procedure date:  05/07/2007  Findings:      small hiatal hernia.  Otherwise normal  Colonoscopy  Procedure date:  11/15/2003  Findings:      Normal except small hemorrhoids   EGD  Procedure date:  09/24/2003  Findings:      Normal except small hiatal hernia and small intestine polyp (biopsy benign.)  EGD  Procedure date:  05/07/2007  Findings:      small hiatal hernia.  Otherwise normal  Colonoscopy  Procedure date:  11/15/2003  Findings:      Normal except small hemorrhoids

## 2010-03-28 NOTE — Progress Notes (Signed)
Summary: requesting wi with PCP  Phone Note Call from Patient Call back at Work Phone [phone]   Reason for Call: Talk to Doctor Summary of Call: requesting wi appt with Dr. Leveda Anna, sts she has a pain in her back that shes concerned about and needs to see MD ASAP Initial call taken by: ERIN LEVAN,  February 10, 2007 2:58 PM  Follow-up for Phone Call        scheduled her for 3:30 today. states she will be here before that Follow-up by: Golden Circle RN,  February 10, 2007 3:03 PM

## 2010-03-28 NOTE — Assessment & Plan Note (Signed)
Summary: f68m/dm  Medications Added DICLOFENAC SODIUM CR 100 MG TB24 (DICLOFENAC SODIUM) Take 1 tablet  prn FEXOFENADINE HCL 180 MG TABS (FEXOFENADINE HCL) Take 1 tablet by mouth once a day as needed hives      Allergies Added:   Visit Type:  Follow-up Primary Provider:  Doralee Albino MD  CC:  palpitations.  History of Present Illness: Courtney Pena is seen today in F/U for HTN and palpitations.  Her BP has been under good control.  She continues to have palpitations.  they are non exertional can happen frequently during the course of a day.  The do not cause SOB or pre-syncope.  They are not associated with SSCP.  She thinks the BB helps.  Her DM is much better controlled and her A1c has dropped from the 10 range to 6.6.  Current Problems (verified): 1)  Hypertension, Benign Systemic  (ICD-401.1) 2)  Coronary, Arteriosclerosis  (ICD-414.00) 3)  Palpitations  (ICD-785.1) 4)  Chest Pain  (ICD-786.50) 5)  Back Pain, Thoracic Region, Left  (ICD-724.1) 6)  Symptom, Headache  (ICD-784.0) 7)  Symptom, Malaise and Fatigue Nec  (ICD-780.79) 8)  Back Pain  (ICD-724.5) 9)  Vertigo Nos or Dizziness  (ICD-780.4) 10)  Tension Headache  (ICD-307.81) 11)  Reflux Esophagitis  (ICD-530.11) 12)  Obesity, Nos  (ICD-278.00) 13)  Leg Pain or Knee Pain  (ICD-729.5) 14)  Hypothyroidism, Unspecified  (ICD-244.9) 15)  Hypercholesterolemia  (ICD-272.0) 16)  Djd, Unspecified  (ICD-715.90) 17)  Diverticulosis of Colon  (ICD-562.10) 18)  Diabetes Mellitus, II, 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 Take 1 Tablet By Mouth Once A Day 2)  Diclofenac Sodium Cr 100 Mg Tb24 (Diclofenac Sodium) .... Take 1 Tablet  Prn 3)  Fexofenadine Hcl 180 Mg Tabs (Fexofenadine Hcl) .... Take 1 Tablet By Mouth Once A Day As  Needed Hives 4)  Glipizide 10 Mg Tb24 (Glipizide) .... Take 1 Tablet By Mouth Once A Day 5)  Hydrochlorothiazide 25 Mg Tabs (Hydrochlorothiazide) .... Take 1 Tablet By Mouth Once A Day 6)  Januvia 100 Mg Tabs (Sitagliptin Phosphate) .... Take 1 Tablet By Mouth Once A Day 7)  Meclizine Hcl 25 Mg Tabs (Meclizine Hcl) .... Take 1 Tablet By Mouth Every Six Hours As Needed Dizziness 8)  Metformin Hcl 1000 Mg Tabs (Metformin Hcl) .... One By Mouth Two Times A Day 9)  Onetouch Test  Strp (Glucose Blood) .Marland Kitchen.. 1 Strip Three Times A Day 10)  Synthroid 100 Mcg Tabs (Levothyroxine Sodium) .Marland Kitchen.. 1 Tablet By Mouth Once A Day 11)  Metoprolol Succinate 25 Mg  Tb24 (Metoprolol Succinate) .... One Tab Daily 12)  Betamethasone Dipropionate Aug 0.05 % Crea (Aug Betamethasone Dipropionate) .... Apply To Affected Area Two Times A Day: Do Not Use On Face  Allergies (verified): 1)  Actos (Pioglitazone Hcl) 2)  Amoxicillin (Amoxicillin) 3)  Cozaar (Losartan Potassium) 4)  Lisinopril (Lisinopril) 5)  Sulfamethoxazole (Sulfamethoxazole)  Past History:  Past Medical History: Last updated: 11/02/2008 Current Problems:  Atypical chest Pain normal cath in 2004 and normal myouve in 2007 HYPERTENSION, BENIGN SYSTEMIC (ICD-401.1) PALPITATIONS (ICD-785.1) Hyperlipidemia BACK PAIN, THORACIC REGION, LEFT (ICD-724.1) SYMPTOM, HEADACHE (ICD-784.0) SYMPTOM, MALAISE AND FATIGUE NEC (ICD-780.79) BACK PAIN (ICD-724.5) VERTIGO NOS OR DIZZINESS (ICD-780.4) TENSION HEADACHE (ICD-307.81) REFLUX ESOPHAGITIS (ICD-530.11) OBESITY, NOS (ICD-278.00) LEG PAIN OR KNEE  PAIN (ICD-729.5) HYPOTHYROIDISM, UNSPECIFIED (ICD-244.9) HYPERCHOLESTEROLEMIA (ICD-272.0) DJD, UNSPECIFIED (ICD-715.90) DIVERTICULOSIS OF COLON (ICD-562.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 ANA  uncertain significance, , minimal CAD by cath 5/04, nl EF,  spinal stenosis by MRI, sx poorly correlate 12/03  Past Surgical History: Last updated: 04/25/2006 ABI=0.87 (mild obstruction) - 05/01/2001, Barium Enema -, bilateral carpal tunnel surgery in 2003 - 04/21/2002, bone scan -, breast biopsy 1985 -, Cardiac Cath - 07/14/2002, Cardiac Cath 1999 normal -, CXR -, ECG -, Echo -, Lipid TC=211, LDL=130, HDL=54, TG=134  01/29/06 - 16/02/958, PFT -, Xray - Spine -  Family History: Last updated: 04/25/2006 - Ca, CVA, Etohism, + CAD, DM, meningioma  Social History: Last updated: 05/20/2006 nonsmoker, nondrinker;  healthy diet; no exercise;  works in Insurance account manager  Review of Systems       Denies fever, malais, weight loss, blurry vision, decreased visual acuity, cough, sputum, SOB, hemoptysis, pleuritic pain,, heartburn, abdominal pain, melena, lower extremity edema, claudication, or rash. All other sytems reviwed and negative  Vital Signs:  Patient profile:   70 year old female Height:      65 inches Weight:      255 pounds Pulse rate:   75 / minute BP sitting:   140 / 88  (left arm)  Vitals Entered By: Laurance Flatten CMA (November 04, 2008 3:51 PM)  Serial Vital Signs/Assessments:  Time      Position  BP       Pulse  Resp  Temp     By                     138/92                         Laurance Flatten CMA   Physical Exam  General:  Affect appropriate Healthy:  appears stated age HEENT: normal Neck supple with no adenopathy JVP normal no bruits no thyromegaly Lungs clear with no wheezing and good diaphragmatic motion Heart:  S1/S2 no murmur,rub, gallop or click PMI normal Abdomen: benighn, BS positve, no tenderness, no AAA no bruit.  No HSM or HJR Distal pulses intact with no bruits Mild bilateral edema with varicosities Neuro non-focal Skin warm and dry    Impression & Recommendations:  Problem # 1:  HYPERTENSION, BENIGN SYSTEMIC (ICD-401.1) Well controlled continue attempts  at weight loss Her updated medication list for this problem includes:    Bayer Childrens Aspirin 81 Mg Chew (Aspirin) .Marland Kitchen... Take 1 tablet by mouth once a day    Hydrochlorothiazide 25 Mg Tabs (Hydrochlorothiazide) .Marland Kitchen... Take 1 tablet by mouth once a day    Metoprolol Succinate 25 Mg Tb24 (Metoprolol succinate) ..... One tab daily  Problem # 2:  PALPITATIONS (ICD-785.1) Event monitor likely bengin.  Continue BB Her updated medication list for this problem includes:    Bayer Childrens Aspirin 81 Mg Chew (Aspirin) .Marland Kitchen... Take 1 tablet by mouth once a day    Metoprolol Succinate 25 Mg Tb24 (Metoprolol succinate) ..... One tab daily  Orders: EKG w/ Interpretation (93000) Event (Event)  Problem # 3:  HYPERCHOLESTEROLEMIA (ICD-272.0) Need better contorl will consider increase to 40mg  on return visit The following medications were removed from the medication list:    Pravastatin Sodium 20 Mg Tabs (Pravastatin sodium) ..... One by mouth at bedtime  CHOL: 211 (12/19/2007)  LDL: 139 (12/19/2007)   HDL: 51 (12/19/2007)   TG: 104 (12/19/2007)  Problem # 4:  EDEMA (ICD-782.3) Continue low sodum diet and diuretic.  Elevate legs at end of day  Patient Instructions: 1)  Your physician recommends that you schedule a follow-up appointment in: 6-8 weeks 2)  Your physician has recommended that you wear an event monitor.  Event monitors are medical devices that record the heart's electrical activity. Doctors most often use these monitors to diagnose arrhythmias. Arrhythmias are problems with the speed or rhythm of the heartbeat. The monitor is a small, portable device. You can wear one while you do your normal daily activities. This is usually used to diagnose what is causing palpitations/syncope (passing out).   Echocardiogram Report  Procedure date:  11/04/2008  Findings:      NSR 75 LAD  Poor R wave progression Abnormal ECG

## 2010-03-28 NOTE — Progress Notes (Signed)
Summary: Medications  Phone Note Call from Patient Call back at Work Phone [phone]   Reason for Call: Talk to Doctor Summary of Call: pt would like to speak with RN re: medications that were sent to the pharmacy, she sts the wrong ones were sent Initial call taken by: ERIN LEVAN,  August 13, 2006 11:02 AM  Follow-up for Phone Call        Spoke with pt sent a message from previous phone note to Dr. Leveda Anna regarding med corrections. Follow-up by: AMY MARTIN RN,  August 13, 2006 12:23 PM

## 2010-03-28 NOTE — Letter (Signed)
Summary: New Patient letter  [facility]  [address] [place], [place] [address]   Phone: [phone]  Fax: [phone]       08/19/2009 MRN: [id number]  [facility] [address] [place], [place]  [address]  Dear [name],  Welcome to the Gastroenterology Division at [facility].    You are scheduled to see Dr. Jarold Motto on 09/23/2009 at 9:00AM on the 3rd floor at [facility], [address]  We ask that you try to arrive at our office 15 minutes prior to your appointment time to allow for check-in.  We would like you to complete the enclosed self-administered evaluation form prior to your visit and bring it with you on the day of your appointment.  We will review it with you.  Also, please bring a complete list of all your medications or, if you prefer, bring the medication bottles and we will list them.  Please bring your insurance card so that we may make a copy of it.  If your insurance requires a referral to see a specialist, please bring your referral form from your primary care physician.  Co-payments are due at the time of your visit and may be paid by cash, check or credit card.     Your office visit will consist of a consult with your physician (includes a physical exam), any laboratory testing he/she may order, scheduling of any necessary diagnostic testing (e.g. x-ray, ultrasound, CT-scan), and scheduling of a procedure (e.g. Endoscopy, Colonoscopy) if required.  Please allow enough time on your schedule to allow for any/all of these possibilities.    If you cannot keep your appointment, please call [phone] to cancel or reschedule prior to your appointment date.  This allows [name] the opportunity to schedule an appointment for another patient in need of care.  If you do not cancel or reschedule by 5 p.m. the business day prior to your appointment date, you will be charged a $50.00 late cancellation/no-show fee.     Thank you for choosing [facility] for your medical needs.  We appreciate the opportunity to care for you.  Please visit [name] at our website  to learn more about our practice.                     Sincerely,                                                             The Gastroenterology Division

## 2010-03-28 NOTE — Progress Notes (Signed)
   Phone Note Call from Patient   Caller: Patient Summary of Call: Courtney Pena need you to call her for a work-in this am regarding pain to her legs.  Dr. Leveda Anna is her primary, but he is not able to see her today.  Her number is [phone].  She is awaiting your call. Initial call taken by: Britta Mccreedy mcgregor  Follow-up for Phone Call        c/o  R leg in thigh has "a deep pain & burning" started 2 weeks ago. getting worse. another painful spot in ankle. other leg :"feels fine". wants to be seen this am. aware pcp is not in clinic. to see Dr. Janalyn Harder at 11:15 Follow-up by: Golden Circle RN,  October 19, 2009 9:26 AM  Additional Follow-up for Phone Call Additional follow up Details #1::        noted and agree Additional Follow-up by: Doralee Albino MD,  October 19, 2009 11:02 AM

## 2010-03-28 NOTE — Consult Note (Addendum)
Summary: ENDO  [facility]   Imported By: Haydee Salter 05/12/2007 14:04:05  _____________________________________________________________________  External Attachment:    Type:   Image     Comment:   External Document  Appended Document: UGI endo    Clinical Lists Changes  Observations: Added new observation of UGI ENDOSCOP: abnormal:   Reflux/GERD (05/07/2007 14:24)        Upper Endoscopy  Procedure date:  05/07/2007  Findings:      abnormal:   Reflux/GERD    Upper Endoscopy  Procedure date:  05/07/2007  Findings:      abnormal:   Reflux/GERD

## 2010-03-28 NOTE — Consult Note (Signed)
Summary: [facility]  [facility]   Imported By: Haydee Salter 04/03/2007 14:07:04  _____________________________________________________________________  External Attachment:    Type:   Image     Comment:   External Document

## 2010-03-28 NOTE — Progress Notes (Signed)
Summary: triage   Phone Note Call from Patient Call back at [phone]   Caller: Patient Summary of Call: took BP this AM and it was 175/100 (home monitor)  Initial call taken by: De Nurse,  February 02, 2009 9:37 AM  Follow-up for Phone Call        left message asking her to call me back Follow-up by: Golden Circle RN,  February 02, 2009 9:41 AM  Additional Follow-up for Phone Call Additional follow up Details #1::        pt returned call Additional Follow-up by: De Nurse,  February 02, 2009 9:57 AM    Additional Follow-up for Phone Call Additional follow up Details #2::    193/104 now. took bp meds at 6:30. states it has been running high. HA x 1 wk. to come in before 11am to see her pcp Follow-up by: Golden Circle RN,  February 02, 2009 10:01 AM  Additional Follow-up for Phone Call Additional follow up Details #3:: Details for Additional Follow-up Action Taken: noted and agree Additional Follow-up by: Doralee Albino MD,  February 02, 2009 10:45 AM

## 2010-03-28 NOTE — Progress Notes (Signed)
Summary: [place] request  Phone Note Call from Patient Call back at [phone]   Summary of Call: pt is wanting to be seen for bump on her head Initial call taken by: Haydee Salter,  May 30, 2006 10:27 AM  Follow-up for Phone Call        left message for her to call back Follow-up by: Golden Circle RN,  May 30, 2006 10:40 AM  Additional Follow-up for Phone Call Additional follow up Details #1::        l side behind ear . soft, painful. tylenol used. appt made Additional Follow-up by: Golden Circle RN,  May 30, 2006 10:49 AM

## 2010-03-28 NOTE — Procedures (Signed)
Summary: EGD and Path   EGD  Procedure date:  10/25/2003  Findings:      Location: [facility]    NAME:  Courtney Pena, Courtney Pena               ACCOUNT NO.:  [id number]   MEDICAL RECORD NO.:  [id number]                   PATIENT TYPE:  AMB   LOCATION:  ENDO                                 FACILITY:  [facility]   PHYSICIAN:  Anselmo Rod, M.D.               DATE OF BIRTH:  1940/05/03   DATE OF PROCEDURE:  10/25/2003  DATE OF DISCHARGE:                                 OPERATIVE REPORT   PROCEDURE:  Esophagogastroduodenoscopy with biopsy.   ENDOSCOPIST:  Charna Elizabeth, M.D.   INSTRUMENT USED:  Olympus video panendoscope.   INDICATIONS FOR PROCEDURE:  70 year old African American female with a  history of epigastric pain.  Rule out peptic ulcer disease, esophagitis,  gastritis, etc.  The patient has also had some blood in her stool.   PREPROCEDURE PREPARATION:  Informed consent was obtained from the patient.  The patient was fasted for eight hours prior to the procedure.   PREPROCEDURE PHYSICAL:  Patient with stable vital signs.  Neck supple.  Chest clear to auscultation.  S1 and S2 regular.  Abdomen soft with normal  bowel sounds.   DESCRIPTION OF PROCEDURE:  The patient was placed in the left lateral  decubitus position, sedated with 50 mg of Demerol and 5 mg Versed in slow  incremental doses.  Once the patient was adequately sedated, maintained on  low flow oxygen and continuous cardiac monitoring, the Olympus video  panendoscope was advanced through the mouth piece over the tongue into the  esophagus under direct vision.  The entire esophagus appeared normal with no  evidence of ring, strictures, masses, esophagitis, or Barrett's mucosa.  The  scope was then advanced into the stomach.  A small hiatal hernia was seen on  retroflexion.  There were a few antral erosions present.  These are not  enough to explain the patient's blood in stool or severe abdominal pain.  Biopsies were done to rule out the presence of H. pylori by pathology.  A  small polyp was biopsied from the proximal small bowel, as well.  No ulcers  or masses were seen.  There was a questionable indentation of the antrum  from possible intrinsic compression of the stomach.   IMPRESSION:  1. Normal appearing esophagus.  2. Small hiatal hernia.  3. Antral erosions biopsied for H. pylori.  4. Small sessile polyp biopsied from the small bowel.  5. There was a questionable indentation of the antrum from possible     intrinsic compression of the stomach.   RECOMMENDATIONS:  1. Await pathology results.  2. Avoid nonsteroidals for now.  3. Continue PPIs twice a day.  4. CT scan of the abdomen and pelvis to be scheduled, questionable intrinsic     compression of the antral area.  5. Outpatient follow up in the next two weeks for further recommendations.  Anselmo Rod, M.D.    JNM/MEDQ  D:  10/25/2003  T:  10/25/2003  Job:  [id number]   cc:   William A. Leveda Anna, M.D.  Fax: [phone]     SP Surgical Pathology - STATUS: Final             By: Windy Kalata,      Perform Date: 29Aug05 00:00  Ordered By: Cassandria Anger Date: 29Aug05 14:11  Facility: [facility]                              Department: CPATH  Service Report Text  [facility]   [address]   [place], [place] [address]   [phone]    REPORT OF SURGICAL PATHOLOGY    Case #: [id number]   Patient Name: Courtney Pena, Courtney Pena   PID: [id number]   Pathologist: Alden Server A. Delila Spence, MD   DOB/Age Dec 05, 1940 (Age: 70) Gender: F   Date Taken: 10/25/2003   Date Received: 10/25/2003    FINAL DIAGNOSIS    ***MICROSCOPIC EXAMINATION AND DIAGNOSIS***    1. SMALL INTESTINE, POLYP: POLYPOID FRAGMENT OF BENIGN SMALL   INTESTINAL MUCOSA.   NO ADENOMATOUS CHANGE OR DYSPLASIA IDENTIFIED.    2. STOMACH: REACTIVE GASTROPATHY.  SEE COMMENT.    COMMENT   2. This pattern can be associated with nonsteroidal   anti-inflammatory drugs (NSAIDS), alcohol or with other causes of   chemical gastropathy. Helicobacter pylori are not identified   with Warthin-Starry stain. The control stained appropriately.    kv   Date Reported: 10/27/2003 Alden Server A. Delila Spence, MD   *** Electronically Signed Out By EAA ***    Clinical information   R/O H. Pylori (ms)    specimen(s) obtained   1: Small intestine, polyp   2: Stomach, biopsy, antral    Gross Description   1. Received in formalin is a tan, soft tissue fragment that is   submitted in toto. Size: 0.3 cm.    2. Received in formalin are tan, soft tissue fragments that are   submitted in toto. Number: multiple   Size: each 0.3 cm. (GP:jy) 10/25/03    jy/

## 2010-03-28 NOTE — Assessment & Plan Note (Signed)
Summary: FU/KH   Vital Signs:  Patient Profile:   70 Years Old Female Weight:      285.2 pounds Temp:     97 degrees F BP sitting:   139 / 89  (right arm)  Pt. in pain?   no  Vitals Entered By: Theresia Lo RN (May 20, 2006 3:53 PM)                History of Present Illness: 1.  Back and knee pain improved.  Only taking OTC tylenol.  Not currently taking neurontin.  Nothing more currently indicated.  2. weight Up - poosible fluid retention.    3. Since last visit, terrible URI.  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  Not responding to meclizine.  Has nasal congestion and difficulty clearing ears.  4. DM high blood sugars when ill with URI.  5. Tired.  Sleep deprived?  Has sleep apnea, not using CPAP  6. HBP above normal for diabetic.      Past Medical History:    Reviewed history from 04/25/2006 and no changes required:       ANA uncertain significance,        atypical chest pain       , minimal CAD by cath 5/04, nl EF,        spinal stenosis by MRI, sx poorly correlate 12/03   Family History:    Reviewed history from 04/25/2006 and no changes required:       - Ca, CVA, Etohism, + CAD, DM, meningioma  Social History:    nonsmoker, nondrinker;     healthy diet; no exercise;     works in Insurance account manager   Risk Factors:  Tobacco use:  quit    Physical Exam  General:     Well-developed,well-nourished,in no acute distress; alert,appropriate and cooperative throughout examination Head:     Normocephalic and atraumatic without obvious abnormalities. No apparent alopecia or balding. Ears:     External ear exam shows no significant lesions or deformities.  Otoscopic examination reveals clear canals, tympanic membranes are intact bilaterally without bulging, retraction, inflammation or discharge. Hearing is grossly normal bilaterally. Lungs:     Normal respiratory effort, chest expands symmetrically. Lungs are clear to auscultation, no crackles or wheezes. Heart:  Normal rate and regular rhythm. S1 and S2 normal without gallop, murmur, click, rub or other extra sounds. Extremities:     2+ left pedal edema and 2+ right pedal edema.      Impression & Recommendations:  Problem # 1:  LEG PAIN OR KNEE PAIN (ICD-729.5) Back and knee pain improved.  No Rx needed for now.  Observe  Problem # 2:  OBESITY, NOS (ICD-278.00) Some element of fluid retention.  Continue to focus on diet and exercise  Problem # 3:  VERTIGO NOS OR DIZZINESS (ICD-780.4) Worse with recent URI.  Nasal congestion makes sleep apnea worse.  Will RX with flonase Orders: [facility]- Est  Level 4 (60109)   Problem # 4:  DIABETES MELLITUS, II, COMPLICATIONS (ICD-250.92) Will check HgbA1c today Orders: A1C-[facility] (32355) [facility]- Est  Level 4 (73220)   Problem # 5:  SYMPTOM, MALAISE AND FATIGUE NEC (ICD-780.79) Possible sleep deprivation Possible worsening sleep apnea - see [place] and add flonase R/O medical cause Orders: Comp Met-[facility] (25427-06237) CBC w/Diff-[facility] (62831) [facility]- Est  Level 4 (51761)   Problem # 6:  HYPERTENSION, BENIGN SYSTEMIC (ICD-401.1) Bp up today.  Monitor closely at home.  Watch Na intake.  No med changes for now Orders:  [facility]- Est  Level 4 (91478)   Other Orders: TSH-[facility] ([id number])   Laboratory Results   Blood Tests   Date/Time Recieved: May 20, 2006 4:42 PM  Date/Time Reported: May 20, 2006 4:52 PM   HGBA1C: 7.9%   (Normal Range: Non-Diabetic - 3-6%   Control Diabetic - 6-8%)  Comments: ...............test performed by......Marland KitchenBonnie A. [place], [place] (ASCP)

## 2010-03-28 NOTE — Assessment & Plan Note (Signed)
Summary: CPE, DF   Vital Signs:  Patient Profile:   70 Years Old Female Height:     65 inches (166.37 cm) Weight:      249.7 pounds BMI:     41.70 Pulse rate:   80 / minute BP sitting:   109 / 70  (right arm)  Pt. in pain?   yes    Location:   upper back    Intensity:   4  Vitals Entered By: Arlyss Repress CMA, (December 19, 2007 2:38 PM)                  Chief Complaint:  f/up DM and back pain.  History of Present Illness: Praise Jesus!  She has finally gotten very serious about health has lost 25 lbs in last 2.5 months.  BP is down.  Eating very healthy and exercising.  The difference is amazing.  BS at home have been great.  HgbA1C is 6.9  Does have occaisional toe pain and is worried about possibility of gout  Also having lt upper back pain.  No cough  Phone number to give blood work is [phone]  Last Flex Sig:  Done. (11/27/2003 12:00:00 AM) Flex Sig Next Due:  Not Indicated Last Hemoccult Result: Done. (08/26/2001 12:00:00 AM) Hemoccult Next Due:  Not Indicated Last PAP:  Done. (10/27/2005 12:00:00 AM) PAP Next Due:  Not Indicated    Current Allergies: ACTOS (PIOGLITAZONE HCL) AMOXICILLIN (AMOXICILLIN) COZAAR (LOSARTAN POTASSIUM) LISINOPRIL (LISINOPRIL) SULFAMETHOXAZOLE (SULFAMETHOXAZOLE)    Risk Factors:  Mammogram History:     Date of Last Mammogram:  12/26/2006   PAP Smear History:     Date of Last PAP Smear:  10/27/2005   Colonoscopy History:     Date of Last Colonoscopy:  11/27/2003     Physical Exam  General:     Looks great and is noticably thinner Lungs:     Normal respiratory effort, chest expands symmetrically. Lungs are clear to auscultation, no crackles or wheezes. Extremities:     no edema    Impression & Recommendations:  Problem # 1:  BACK PAIN, THORACIC REGION, LEFT (ICD-724.1)  Her updated medication list for this problem includes:    Bayer Childrens Aspirin 81 Mg Chew (Aspirin) .Marland Kitchen... Take 1 tablet by mouth once a  day    Diclofenac Sodium Cr 100 Mg Tb24 (Diclofenac sodium) .Marland Kitchen... Take 1 tablet by mouth once a day prn  Orders: [facility]- Est  Level 4 (99214) CXR- 2view (CXR)   Problem # 2:  HYPERTENSION, BENIGN SYSTEMIC (ICD-401.1) Assessment: Improved  The following medications were removed from the medication list:    Norvasc 10 Mg Tabs (Amlodipine besylate) .Marland Kitchen... 1 tablet by mouth once a day  Her updated medication list for this problem includes:    Hydrochlorothiazide 25 Mg Tabs (Hydrochlorothiazide) .Marland Kitchen... Take 1 tablet by mouth once a day    Metoprolol Succinate 25 Mg Tb24 (Metoprolol succinate) ..... 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 Take 1 tablet by mouth once a day    Glipizide 10 Mg Tb24 (Glipizide) .Marland Kitchen... Take 1 tablet by mouth once a day    Januvia 100 Mg Tabs (Sitagliptin phosphate) .Marland Kitchen... Take 1 tablet by mouth once a day    Metformin Hcl 1000 Mg Tabs (Metformin hcl) .Marland KitchenMarland KitchenMarland KitchenMarland Kitchen  One by mouth two times a day  Orders: A1C-[facility] (14782) [facility]- Est  Level 4 (95621)   Problem # 4:  HYPOTHYROIDISM, UNSPECIFIED (ICD-244.9)  Her updated medication list for this problem includes:    Synthroid 100 Mcg Tabs (Levothyroxine sodium) .Marland Kitchen... 1 tablet by mouth once a day  Orders: TSH-[facility] (30865-78469) [facility]- Est  Level 4 (62952)   Problem # 5:  HYPERCHOLESTEROLEMIA (ICD-272.0)  The following medications were removed from the medication list:    Crestor 10 Mg Tabs (Rosuvastatin calcium) .Marland Kitchen... Take 1 tablet by mouth once a day  Orders: Lipid-[facility] (84132-44010) Comp Met-[facility] (27253-66440) [facility]- Est  Level 4 (34742)   Problem # 6:  LEG PAIN OR KNEE PAIN (ICD-729.5) Concerned for gout.  Will stop HCTZ if uric acid up. Orders: Uric Acid-[facility] (59563-87564)   Complete Medication List: 1)   Bayer Childrens Aspirin 81 Mg Chew (Aspirin) .... Take 1 tablet by mouth once a day 2)  Diclofenac Sodium Cr 100 Mg Tb24 (Diclofenac sodium) .... Take 1 tablet by mouth once a day prn 3)  Fexofenadine Hcl 180 Mg Tabs (Fexofenadine hcl) .... Take 1 tablet by mouth once a day as needed hives 4)  Glipizide 10 Mg Tb24 (Glipizide) .... Take 1 tablet by mouth once a day 5)  Hydrochlorothiazide 25 Mg Tabs (Hydrochlorothiazide) .... Take 1 tablet by mouth once a day 6)  Januvia 100 Mg Tabs (Sitagliptin phosphate) .... Take 1 tablet by mouth once a day 7)  Meclizine Hcl 25 Mg Tabs (Meclizine hcl) .... Take 1 tablet by mouth every six hours as needed dizziness 8)  Metformin Hcl 1000 Mg Tabs (Metformin hcl) .... One by mouth two times a day 9)  Onetouch Test Strp (Glucose blood) .Marland Kitchen.. 1 strip three times a day 10)  Synthroid 100 Mcg Tabs (Levothyroxine sodium) .Marland Kitchen.. 1 tablet by mouth once a day 11)  Metoprolol Succinate 25 Mg Tb24 (Metoprolol succinate) .... One tab daily  Other Orders: Influenza Vaccine MCR (33295)   Patient Instructions: 1)  Hold norvasc watch blood pressure   Prescriptions: MECLIZINE HCL 25 MG TABS (MECLIZINE HCL) Take 1 tablet by mouth every six hours as needed dizziness  #30 x 12   Entered and Authorized by:   Doralee Albino MD   Signed by:   Doralee Albino MD on 12/19/2007   Method used:   Electronically to        [facility] [id number] (retail)       [address]       [place], [place]  [address]       Ph: [phone] or [phone]       Fax: [phone]   RxID:   [id number]  ] Laboratory Results   Blood Tests   Date/Time Received: December 19, 2007 3:00 PM  Date/Time Reported: December 19, 2007 3:15 PM   HGBA1C: 6.9%   (Normal Range: Non-Diabetic - 3-6%   Control Diabetic - 6-8%)  Comments: .................test performed by .....................Marland KitchenMarland KitchenArlyss Repress, CMA .............entered by...........Marland KitchenBonnie A. [place], [place]  (ASCP)       Influenza Vaccine    Vaccine Type: Fluvax MCR    Site: left deltoid    Mfr: GlaxoSmithKline    Dose: 0.5 ml    Route: IM    Given by: Arlyss Repress CMA,    Exp. Date: 08/25/2008    Lot #: [id number]    VIS given: 09/19/06 version given December 19, 2007.  Flu Vaccine Consent Questions    Do you have a history  of severe allergic reactions to this vaccine? no    Any prior history of allergic reactions to egg and/or gelatin? no    Do you have a sensitivity to the preservative Thimersol? no    Do you have a past history of Guillan-Barre Syndrome? no    Do you currently have an acute febrile illness? no    Have you ever had a severe reaction to latex? no    Vaccine information given and explained to patient? yes    Are you currently pregnant? no  Vaccine Consent Questions for Influenza    Do you have a history of severe allergic reactions to this vaccine? no    Any prior history of allergic reactions to egg and/or gelatin? no    Do you currently have an acute febrile illness? no    Have you ever had a severe reaction to latex? no    Patient is moderately or severely ill? no    Vaccine information given and explained to patient? yes    Are you currently pregnant? no

## 2010-03-28 NOTE — Assessment & Plan Note (Signed)
Summary: f/up,tcb   Vital Signs:  Patient profile:   70 year old female Height:      65 inches Weight:      252.9 pounds Temp:     97.8 degrees F oral Pulse rate:   70 / minute BP sitting:   151 / 85  (left arm)  Vitals Entered By: Alphia Kava (October 01, 2008 10:55 AM)  Serial Vital Signs/Assessments:  Time      Position  BP       Pulse  Resp  Temp     By                     155/83                         Doralee Albino MD  CC: f/u Is Patient Diabetic? Yes   CC:  f/u.  History of Present Illness: Weight loss plateaued.  She is back on track.    Has been feeling well.  Had a throat problem but it resolved  C/O Rt hip pain  Also has flank pain radiating to groin.    Was told by GI had normal colonoscopy in 2005 - but repeat in 5 years?  We will get records.  Also told she had gall bladder problems and no follow up has been done.    Work phone [phone] direct line.  OK to leave message   Habits & Providers  Alcohol-Tobacco-Diet     Tobacco Status: never  Current Medications (verified): 1)  Bayer Childrens Aspirin 81 Mg Chew (Aspirin) .... Take 1 Tablet By Mouth Once A Day 2)  Diclofenac Sodium Cr 100 Mg Tb24 (Diclofenac Sodium) .... Take 1 Tablet By Mouth Once A Day Prn 3)  Fexofenadine Hcl 180 Mg Tabs (Fexofenadine Hcl) .... Take 1 Tablet By Mouth Once A Day As Needed Hives 4)  Glipizide 10 Mg Tb24 (Glipizide) .... Take 1 Tablet By Mouth Once A Day 5)  Hydrochlorothiazide 25 Mg Tabs (Hydrochlorothiazide) .... Take 1 Tablet By Mouth Once A Day 6)  Januvia 100 Mg Tabs (Sitagliptin Phosphate) .... Take 1 Tablet By Mouth Once A Day 7)  Meclizine Hcl 25 Mg Tabs (Meclizine Hcl) .... Take 1 Tablet By Mouth Every Six Hours As Needed Dizziness 8)  Metformin Hcl 1000 Mg Tabs (Metformin Hcl) .... One By Mouth Two Times A Day 9)  Onetouch Test  Strp (Glucose Blood) .Marland Kitchen.. 1 Strip Three Times A Day 10)  Synthroid 100 Mcg Tabs (Levothyroxine Sodium) .Marland Kitchen.. 1 Tablet By Mouth Once  A Day 11)  Metoprolol Succinate 25 Mg  Tb24 (Metoprolol Succinate) .... One Tab Daily 12)  Hydrocodone-Acetaminophen 5-325 Mg Tabs (Hydrocodone-Acetaminophen) .Marland Kitchen.. 1 By Mouth Up To 4 Times Per Day As Needed For Pain 13)  Betamethasone Dipropionate Aug 0.05 % Crea (Aug Betamethasone Dipropionate) .... Apply To Affected Area Two Times A Day: Do Not Use On Face  Allergies (verified): 1)  Actos (Pioglitazone Hcl) 2)  Amoxicillin (Amoxicillin) 3)  Cozaar (Losartan Potassium) 4)  Lisinopril (Lisinopril) 5)  Sulfamethoxazole (Sulfamethoxazole)  Past History:  Past medical, surgical, family and social histories (including risk factors) reviewed, and no changes noted (except as noted below).  Past Medical History: Reviewed history from 12/09/2006 and no changes required. ANA uncertain significance,  atypical chest pain , minimal CAD by cath 5/04, nl EF,  spinal stenosis by MRI, sx poorly correlate 12/03 Has sleep apnea.  Need device given 10/08  Past Surgical History: Reviewed history from 04/25/2006 and no changes required. ABI=0.87 (mild obstruction) - 05/01/2001, Barium Enema -, bilateral carpal tunnel surgery in 2003 - 04/21/2002, bone scan -, breast biopsy 1985 -, Cardiac Cath - 07/14/2002, Cardiac Cath 1999 normal -, CXR -, ECG -, Echo -, Lipid TC=211, LDL=130, HDL=54, TG=134  01/29/06 - 95/07/2128, PFT -, Xray - Spine -  Family History: Reviewed history from 04/25/2006 and no changes required. - Ca, CVA, Etohism, + CAD, DM, meningioma  Social History: Reviewed history from 05/20/2006 and no changes required. nonsmoker, nondrinker;  healthy diet; no exercise;  works in [employer] account manager  Physical Exam  General:  Well-developed,well-nourished,in no acute distress; alert,appropriate and cooperative throughout examination Neck:  No deformities, masses, or tenderness noted. Lungs:  Normal respiratory effort, chest expands symmetrically. Lungs are clear to auscultation, no crackles or  wheezes. Heart:  Normal rate and regular rhythm. S1 and S2 normal without gallop, murmur, click, rub or other extra sounds. Extremities:  No clubbing, cyanosis, edema, or deformity noted with normal full range of motion of all joints.     Impression & Recommendations:  Problem # 1:  BACK PAIN (ICD-724.5)  Her updated medication list for this problem includes:    Bayer Childrens Aspirin 81 Mg Chew (Aspirin) .Marland Kitchen... Take 1 tablet by mouth once a day    Diclofenac Sodium Cr 100 Mg Tb24 (Diclofenac sodium) .Marland Kitchen... Take 1 tablet by mouth once a day prn    Hydrocodone-acetaminophen 5-325 Mg Tabs (Hydrocodone-acetaminophen) .Marland Kitchen... 1 by mouth up to 4 times per day as needed for pain  Orders: Urinalysis-[facility] (00000) [facility]- Est  Level 4 (99214)  Problem # 2:  BACK PAIN, THORACIC REGION, LEFT (ICD-724.1)  Her updated medication list for this problem includes:    Bayer Childrens Aspirin 81 Mg Chew (Aspirin) .Marland Kitchen... Take 1 tablet by mouth once a day    Diclofenac Sodium Cr 100 Mg Tb24 (Diclofenac sodium) .Marland Kitchen... Take 1 tablet by mouth once a day prn    Hydrocodone-acetaminophen 5-325 Mg Tabs (Hydrocodone-acetaminophen) .Marland Kitchen... 1 by mouth up to 4 times per day as needed for pain  Orders: Vit D, 25 OH-[facility] ([id number]) [facility]- Est  Level 4 (95284)  Problem # 3:  HYPERTENSION, BENIGN SYSTEMIC (ICD-401.1) Assessment: Unchanged  Her updated medication list for this problem includes:    Hydrochlorothiazide 25 Mg Tabs (Hydrochlorothiazide) .Marland Kitchen... Take 1 tablet by mouth once a day    Metoprolol Succinate 25 Mg Tb24 (Metoprolol succinate) ..... 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 Take 1 tablet by mouth once a day    Glipizide 10 Mg Tb24  (Glipizide) .Marland Kitchen... Take 1 tablet by mouth once a day    Januvia 100 Mg Tabs (Sitagliptin phosphate) .Marland Kitchen... Take 1 tablet by mouth once a day    Metformin Hcl 1000 Mg Tabs (Metformin hcl) ..... One by mouth two times a day  Orders: A1C-[facility] (01027) [facility]- Est  Level 4 (25366)  Problem # 6:  HYPERCHOLESTEROLEMIA (ICD-272.0)  Orders: Direct LDL-[facility] (44034-74259) Comp Met-[facility] ([id number]) [facility]- Est  Level 4 (29518)  Complete Medication List: 1)  Bayer Childrens Aspirin 81 Mg Chew (Aspirin) .... Take 1 tablet by mouth once a day 2)  Diclofenac Sodium Cr 100 Mg Tb24 (Diclofenac  sodium) .... Take 1 tablet by mouth once a day prn 3)  Fexofenadine Hcl 180 Mg Tabs (Fexofenadine hcl) .... Take 1 tablet by mouth once a day as needed hives 4)  Glipizide 10 Mg Tb24 (Glipizide) .... Take 1 tablet by mouth once a day 5)  Hydrochlorothiazide 25 Mg Tabs (Hydrochlorothiazide) .... Take 1 tablet by mouth once a day 6)  Januvia 100 Mg Tabs (Sitagliptin phosphate) .... Take 1 tablet by mouth once a day 7)  Meclizine Hcl 25 Mg Tabs (Meclizine hcl) .... Take 1 tablet by mouth every six hours as needed dizziness 8)  Metformin Hcl 1000 Mg Tabs (Metformin hcl) .... One by mouth two times a day 9)  Onetouch Test Strp (Glucose blood) .Marland Kitchen.. 1 strip three times a day 10)  Synthroid 100 Mcg Tabs (Levothyroxine sodium) .Marland Kitchen.. 1 tablet by mouth once a day 11)  Metoprolol Succinate 25 Mg Tb24 (Metoprolol succinate) .... One tab daily 12)  Hydrocodone-acetaminophen 5-325 Mg Tabs (Hydrocodone-acetaminophen) .Marland Kitchen.. 1 by mouth up to 4 times per day as needed for pain 13)  Betamethasone Dipropionate Aug 0.05 % Crea (Aug betamethasone dipropionate) .... Apply to affected area two times a day: do not use on face  Other Orders: Radiology other (Radiology Other)  Patient Instructions: 1)  Please schedule a follow-up appointment in 2 months.    Prevention & Chronic Care Immunizations   Influenza vaccine: Fluvax MCR  (12/19/2007)    Influenza vaccine due: 12/09/2007    Tetanus booster: 11/26/2000: Done.   Tetanus booster due: 11/27/2010    Pneumococcal vaccine: Pneumovax  (12/09/2006)   Pneumococcal vaccine due: None    H. zoster vaccine: 10/01/2007: Zostavax  Colorectal Screening   Hemoccult: Done.  (08/26/2001)   Hemoccult due: Not Indicated    Colonoscopy: Done.  (11/27/2003)   Colonoscopy due: 11/26/2013  Other Screening   Pap smear: Done.  (10/27/2005)   Pap smear due: Not Indicated    Mammogram: ASSESSMENT: Negative - BI-RADS 1^MM DIGITAL SCREENING  (03/12/2008)   Mammogram due: 03/12/2009    DXA bone density scan: Not documented   DXA scan due: Not Indicated    Smoking status: never  (10/01/2008)  Diabetes Mellitus   HgbA1C: 6.7  (10/01/2008)   Hemoglobin A1C due: 01/01/2008    Eye exam: Not documented    Foot exam: Not documented   High risk foot: Not documented   Foot care education: Not documented    Urine microalbumin/creatinine ratio: Not documented    Diabetes flowsheet reviewed?: Yes   Progress toward A1C goal: At goal  Lipids   Total Cholesterol: 211  (12/19/2007)   LDL: 139  (12/19/2007)   LDL Direct: 169  (10/01/2007)   HDL: 51  (12/19/2007)   Triglycerides: 104  (12/19/2007)    SGOT (AST): 19  (08/13/2008)   SGPT (ALT): 27  (08/13/2008) CMP ordered    Alkaline phosphatase: 64  (08/13/2008)   Total bilirubin: 0.4  (08/13/2008)    Lipid flowsheet reviewed?: Yes   Progress toward LDL goal: Unchanged  Hypertension   Last Blood Pressure: 151 / 85  (10/01/2008)   Serum creatinine: 0.62  (08/13/2008)   Serum potassium 4.0  (08/13/2008) CMP ordered     Hypertension flowsheet reviewed?: Yes   Progress toward BP goal: Unchanged  Self-Management Support :    Diabetes self-management support: Written self-care plan  (10/01/2008)   Diabetes care plan printed    Hypertension self-management support: Written self-care plan  (10/01/2008)   Hypertension self-care plan  printed.  Lipid self-management support: Written self-care plan  (10/01/2008)   Lipid self-care plan printed.   Laboratory Results   Urine Tests  Date/Time Received: October 01, 2008 11:42 AM  Date/Time Reported: October 01, 2008 2:41 PM   Routine Urinalysis   Color: yellow Appearance: Clear Glucose: negative   (Normal Range: Negative) Bilirubin: negative   (Normal Range: Negative) Ketone: negative   (Normal Range: Negative) Spec. Gravity: 1.025   (Normal Range: 1.003-1.035) Blood: negative   (Normal Range: Negative) pH: 5.5   (Normal Range: 5.0-8.0) Protein: negative   (Normal Range: Negative) Urobilinogen: 0.2   (Normal Range: 0-1) Nitrite: negative   (Normal Range: Negative) Leukocyte Esterace: small   (Normal Range: Negative)  Urine Microscopic WBC/HPF: 1-5 RBC/HPF: rare Bacteria/HPF: trace Epithelial/HPF: few    Comments: ...............test performed by......Marland KitchenBonnie A. [place], [place] (ASCP)   Blood Tests   Date/Time Received: October 01, 2008 10:59 AM  Date/Time Reported: October 01, 2008 2:42 PM   HGBA1C: 6.7%   (Normal Range: Non-Diabetic - 3-6%   Control Diabetic - 6-8%)  Comments: ...............test performed by......Marland KitchenBonnie A. [place], [place] (ASCP)

## 2010-03-28 NOTE — Assessment & Plan Note (Signed)
Summary: rov/ gd  Medications Added METOPROLOL SUCCINATE 50 MG XR24H-TAB (METOPROLOL SUCCINATE) Take one tablet by mouth daily DOXAZOSIN MESYLATE 2 MG TABS (DOXAZOSIN MESYLATE) 1 qd      Allergies Added:   Primary Provider:  Doralee Albino MD  CC:  sob and when she walks sometime she gets an uncomfortable feeling in chest. pt  has decreased her Toprol .  History of Present Illness: Courtney Pena is seen today for F/U of dyspnea, palpitations, HTN and mild CAD.  Cath in 2004 showed mild plaque and she had a nonischemic myovue in 2007.  Her BS is suboptimally controlled due to dietary indiscretion.  Her A1c is above 7 again.  However at one point she was above 11.  She decreased her Toprol on her own to 50mg  as higher doses made her fatigued.  She has had multiple side effects to ARB's and ACE inhibitors including angioedema.  She continues to have occasional flip flops in her heart but no sustained arrythmia.  She has exertional dyspnea that is functional due to her sedentary nature and obesity.  Her  BP is not adequately controlled.  She had normal Ao and renals on cath in 2004 and I think her HTN is essential.  Current Problems (verified): 1)  Hypertension, Benign Systemic  (ICD-401.1) 2)  Coronary, Arteriosclerosis  (ICD-414.00) 3)  Palpitations  (ICD-785.1) 4)  Chest Pain  (ICD-786.50) 5)  Back Pain, Thoracic Region, Left  (ICD-724.1) 6)  Symptom, Headache  (ICD-784.0) 7)  Symptom, Malaise and Fatigue Nec  (ICD-780.79) 8)  Back Pain  (ICD-724.5) 9)  Vertigo Nos or Dizziness  (ICD-780.4) 10)  Tension Headache  (ICD-307.81) 11)  Reflux Esophagitis  (ICD-530.11) 12)  Obesity, Nos  (ICD-278.00) 13)  Leg Pain or Knee Pain  (ICD-729.5) 14)  Hypothyroidism, Unspecified  (ICD-244.9) 15)  Hypercholesterolemia  (ICD-272.0) 16)  Djd, Unspecified  (ICD-715.90) 17)  Diverticulosis of Colon  (ICD-562.10) 18)  Diabetes Mellitus, II, 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 Take 1 Tablet By Mouth Once A Day 2)  Fexofenadine Hcl 180 Mg Tabs (Fexofenadine Hcl) .... Take 1 Tablet By Mouth Once A Day As Needed Hives 3)  Glipizide 10 Mg Tb24 (Glipizide) .... Take 1 Tablet By Mouth Once A Day 4)  Hydrochlorothiazide 25 Mg Tabs (Hydrochlorothiazide) .... Take 1 Tablet By Mouth Once A Day 5)  Januvia 100 Mg Tabs (Sitagliptin Phosphate) .... Take 1 Tablet By Mouth Once A Day 6)  Meclizine Hcl 25 Mg Tabs (Meclizine Hcl) .... Take 1 Tablet By Mouth Every Six Hours As Needed Dizziness 7)  Metformin Hcl 1000 Mg Tabs (Metformin Hcl) .... One By Mouth Two Times A Day 8)  Onetouch Test  Strp (Glucose Blood) .Marland Kitchen.. 1 Strip Three Times A Day 9)  Levothyroxine Sodium 112 Mcg Tabs (Levothyroxine Sodium) .... One By Mouth Daily 10)  Metoprolol Succinate 50 Mg Xr24h-Tab (Metoprolol Succinate) .... Take One Tablet By Mouth Daily 11)  Betamethasone Dipropionate Aug 0.05 % Crea (Aug Betamethasone Dipropionate) .... Apply To Affected Area Two Times A Day: Do Not Use On Face  Allergies (verified): 1)  Actos (Pioglitazone Hcl) 2)  Amoxicillin (Amoxicillin) 3)  Cozaar (Losartan Potassium) 4)  Lisinopril (Lisinopril) 5)  Sulfamethoxazole (Sulfamethoxazole)  Past History:  Past Medical History: Last updated: 11/02/2008 Current Problems:  Atypical  chest Pain normal cath in 2004 and normal myouve in 2007 HYPERTENSION, BENIGN SYSTEMIC (ICD-401.1) PALPITATIONS (ICD-785.1) Hyperlipidemia BACK PAIN, THORACIC REGION, LEFT (ICD-724.1) SYMPTOM, HEADACHE (ICD-784.0) SYMPTOM, MALAISE AND FATIGUE NEC (ICD-780.79) BACK PAIN (ICD-724.5) VERTIGO NOS OR DIZZINESS (ICD-780.4) TENSION HEADACHE (ICD-307.81) REFLUX ESOPHAGITIS (ICD-530.11) OBESITY, NOS (ICD-278.00) LEG PAIN OR KNEE PAIN (ICD-729.5) HYPOTHYROIDISM,  UNSPECIFIED (ICD-244.9) HYPERCHOLESTEROLEMIA (ICD-272.0) DJD, UNSPECIFIED (ICD-715.90) DIVERTICULOSIS OF COLON (ICD-562.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 ANA uncertain significance, , minimal CAD by cath 5/04, nl EF,  spinal stenosis by MRI, sx poorly correlate 12/03  Past Surgical History: Last updated: 04/25/2006 ABI=0.87 (mild obstruction) - 05/01/2001, Barium Enema -, bilateral carpal tunnel surgery in 2003 - 04/21/2002, bone scan -, breast biopsy 1985 -, Cardiac Cath - 07/14/2002, Cardiac Cath 1999 normal -, CXR -, ECG -, Echo -, Lipid TC=211, LDL=130, HDL=54, TG=134  01/29/06 - 16/02/958, PFT -, Xray - Spine -  Family History: Last updated: 04/25/2006 - Ca, CVA, Etohism, + CAD, DM, meningioma  Social History: Last updated: 05/20/2006 nonsmoker, nondrinker;  healthy diet; no exercise;  works in Insurance account manager  Review of Systems       Denies fever, malais, weight loss, blurry vision, decreased visual acuity, cough, sputum, hemoptysis, pleuritic pain, heartburn, abdominal pain, melena, lower extremity edema, claudication, or rash.   Vital Signs:  Patient profile:   70 year old female Height:      65 inches Weight:      269 pounds BMI:     44.93 Pulse rate:   72 / minute Resp:     14 per minute BP sitting:   161 / 91  (left arm)  Vitals Entered By: Kem Parkinson (August 10, 2009 2:48 PM)  Physical Exam  General:  Affect appropriate Healthy:  appears stated age HEENT: normal Neck supple with no adenopathy JVP normal no bruits no thyromegaly Lungs clear with no wheezing and good diaphragmatic motion Heart:  S1/S2 no murmur,rub, gallop or click PMI normal Abdomen: benighn, BS positve, no tenderness, no AAA no bruit.  No HSM or HJR Distal pulses intact with no bruits No edema Neuro non-focal Skin  warm and dry    Impression & Recommendations:  Problem # 1:  HYPERTENSION, BENIGN SYSTEMIC (ICD-401.1)  Add doxazosin 2mg  and recheck in 8-10 weeks Her updated medication list for this problem includes:    Bayer Childrens Aspirin 81 Mg Chew (Aspirin) .Marland Kitchen... Take 1 tablet by mouth once a day    Hydrochlorothiazide 25 Mg Tabs (Hydrochlorothiazide) .Marland Kitchen... Take 1 tablet by mouth once a day    Metoprolol Succinate 50 Mg Xr24h-tab (Metoprolol succinate) .Marland Kitchen... Take one tablet by mouth daily    Doxazosin Mesylate 2 Mg Tabs (Doxazosin mesylate) .Marland Kitchen... 1 qd  Her updated medication list for this problem includes:    Bayer Childrens Aspirin 81 Mg Chew (Aspirin) .Marland Kitchen... Take 1 tablet by mouth once a day    Hydrochlorothiazide 25 Mg Tabs (Hydrochlorothiazide) .Marland Kitchen... Take 1 tablet by mouth once a day    Metoprolol Succinate 50 Mg Xr24h-tab (Metoprolol succinate) .Marland Kitchen... Take one tablet by mouth daily    Doxazosin Mesylate 2 Mg Tabs (Doxazosin mesylate) .Marland Kitchen... 1 qd  Problem # 2:  CORONARY, ARTERIOSCLEROSIS (ICD-414.00)  Miniaml by cath 2004.  Consider myovue in a year Her updated medication list for this problem includes:    Bayer Childrens Aspirin 81 Mg Chew (Aspirin) .Marland Kitchen... Take 1 tablet  by mouth once a day    Metoprolol Succinate 50 Mg Xr24h-tab (Metoprolol succinate) .Marland Kitchen... Take one tablet by mouth daily  Her updated medication list for this problem includes:    Bayer Childrens Aspirin 81 Mg Chew (Aspirin) .Marland Kitchen... Take 1 tablet by mouth once a day    Metoprolol Succinate 50 Mg Xr24h-tab (Metoprolol succinate) .Marland Kitchen... Take one tablet by mouth daily  Problem # 3:  PALPITATIONS (ICD-785.1)  Benign with normal EF continue BB Her updated medication list for this problem includes:    Bayer Childrens Aspirin 81 Mg Chew (Aspirin) .Marland Kitchen... Take 1 tablet by mouth once a day    Metoprolol Succinate 50 Mg Xr24h-tab (Metoprolol succinate) .Marland Kitchen... Take one tablet by mouth daily  Her updated medication list for this problem  includes:    Bayer Childrens Aspirin 81 Mg Chew (Aspirin) .Marland Kitchen... Take 1 tablet by mouth once a day    Metoprolol Succinate 50 Mg Xr24h-tab (Metoprolol succinate) .Marland Kitchen... Take one tablet by mouth daily  Problem # 4:  OBESITY, NOS (ICD-278.00) Discussed low carb diet which will help with weight and DM.  She is a sugar junky and this is very hard for her  Problem # 5:  HYPERCHOLESTEROLEMIA (ICD-272.0) Has not been taking Pravastatin due to leg cramps.  LDL 6/1 was better at 115 but still too high  She has a script for another statin from Dr Leveda Anna and I encouraged her to start it for target LDL 80 or less The following medications were removed from the medication list:    Pravastatin Sodium 20 Mg Tabs (Pravastatin sodium) ..... One by mouth daily  CHOL: 233 (02/02/2009)   LDL: 160 (02/02/2009)   HDL: 52 (02/02/2009)   TG: 104 (02/02/2009)  Patient Instructions: 1)  Your physician recommends that you schedule a follow-up appointment in: 8-10 weeks with dr Eden Emms 2)  Your physician has recommended you make the following change in your medication: START DOXAZOSIN  2MG  1 once daily Prescriptions: DOXAZOSIN MESYLATE 2 MG TABS (DOXAZOSIN MESYLATE) 1 qd  #30 x 11   Entered by:   Scherrie Bateman, LPN   Authorized by:   Colon Branch, MD, [facility]   Signed by:   Scherrie Bateman, LPN on 16/11/9602   Method used:   [facility] to        [facility] (retail)       [address]       [place], [place]  [address]       Ph: [phone] or [phone]       Fax: [phone]   RxID:   [id number]

## 2010-03-28 NOTE — Progress Notes (Signed)
Summary: phn msg   Phone Note Call from Patient Call back at [phone] have her paged   Caller: Patient Summary of Call: Returning Dr. Cyndia Skeeters call. Initial call taken by: Clydell Hakim,  Jul 19, 2009 2:17 PM  Follow-up for Phone Call        to PCP Follow-up by: Gladstone Pih,  Jul 19, 2009 2:45 PM  Additional Follow-up for Phone Call Additional follow up Details #1::        Talked.  she has an appointment in one week.  I also sent metoprolol Rx to [facility].  She is set for prescriptions for now. Additional Follow-up by: Doralee Albino MD,  Jul 20, 2009 1:28 PM    Prescriptions: METOPROLOL SUCCINATE 50 MG XR24H-TAB (METOPROLOL SUCCINATE) one daily  #30 x 3   Entered and Authorized by:   Doralee Albino MD   Signed by:   Doralee Albino MD on 07/20/2009   Method used:   Electronically to        [facility] [phone]* (retail)       [address]       [place], [place]  [address]       Ph: [phone]       Fax: [phone]   RxID:   [id number]

## 2010-04-04 ENCOUNTER — Encounter: Payer: Self-pay | Admitting: Cardiovascular Disease

## 2010-04-04 ENCOUNTER — Ambulatory Visit (INDEPENDENT_AMBULATORY_CARE_PROVIDER_SITE_OTHER): Payer: 59 | Admitting: Cardiovascular Disease

## 2010-04-04 DIAGNOSIS — I10 Essential (primary) hypertension: Secondary | ICD-10-CM

## 2010-04-04 DIAGNOSIS — R0602 Shortness of breath: Secondary | ICD-10-CM

## 2010-04-04 DIAGNOSIS — R072 Precordial pain: Secondary | ICD-10-CM

## 2010-04-05 ENCOUNTER — Telehealth: Payer: Self-pay | Admitting: Family Medicine

## 2010-04-05 NOTE — Letter (Signed)
Summary: New Patient letter  [facility]  [address]   [place], [place] [address]   Phone: [phone]  Fax: [phone]       03/28/2010 MRN: [id number]  [facility] [address] [place], [place]  [address]  Dear [name],  Welcome to the Gastroenterology Division at [facility].    You are scheduled to see Dr.  Jarold Motto on 04/28/2010 at 9:00am on the 3rd floor at [facility], [address]  We ask that you try to arrive at our office 15 minutes prior to your appointment time to allow for check-in.  We would like you to complete the enclosed self-administered evaluation form prior to your visit and bring it with you on the day of your appointment.  We will review it with you.  Also, please bring a complete list of all your medications or, if you prefer, bring the medication bottles and we will list them.  Please bring your insurance card so that we may make a copy of it.  If your insurance requires a referral to see a specialist, please bring your referral form from your primary care physician.  Co-payments are due at the time of your visit and may be paid by cash, check or credit card.     Your office visit will consist of a consult with your physician (includes a physical exam), any laboratory testing he/she may order, scheduling of any necessary diagnostic testing (e.g. x-ray, ultrasound, CT-scan), and scheduling of a procedure (e.g. Endoscopy, Colonoscopy) if required.  Please allow enough time on your schedule to allow for any/all of these possibilities.    If you cannot keep your appointment, please call [phone] to cancel or reschedule prior to your appointment date.  This allows [name] the opportunity to schedule an appointment for another patient in need of care.  If you do not cancel or reschedule by 5 p.m. the business day prior to your appointment date, you will be charged a $50.00 late cancellation/no-show fee.     Thank you for choosing [facility] for your medical needs.  We appreciate the opportunity to care for you.  Please visit [name] at our website  to learn more about our practice.                     Sincerely,                                                             The Gastroenterology Division

## 2010-04-05 NOTE — Telephone Encounter (Signed)
Called and discussed husband's new diagnosis of lymphoma.

## 2010-04-12 ENCOUNTER — Encounter: Payer: Self-pay | Admitting: Family Medicine

## 2010-04-13 NOTE — Assessment & Plan Note (Signed)
Summary: ROV per pt call/LG/CT -----PT RSA PPT/MT      Allergies Added:   Primary Provider:  Doralee Albino MD   History of Present Illness: Courtney Pena is seen today for F/U of dyspnea, palpitations, HTN and mild CAD.  Cath in 2004 showed mild plaque and she had a nonischemic myovue in 2007.  Her BS is suboptimally controlled due to dietary indiscretion.  Her A1c is above 7 again.  However at one point she was above 11.  She decreased her Toprol on her own to 50mg  as higher doses made her fatigued.  She has had multiple side effects to ARB's and ACE inhibitors including angioedema.  She continues to have occasional flip flops in her heart but no sustained arrythmia.  She has exertional dyspnea that is functional due to her sedentary nature and obesity.  Her  BP is not adequately controlled.  She had normal Ao and renals on cath in 2004 and I think her HTN is essential.  She has had a lot of stess lately with her husband being diagnosed with gastric cancer.  She feels SSCP or pressure sensation that is not relatted to exercise.  SSCP for last 3 weeks persistant.  Agravated by stress No syncope,pleurisy,or diaphoresis  Current Problems (verified): 1)  Leg Pain, Right  (ICD-729.5) 2)  Hypertension, Benign Systemic  (ICD-401.1) 3)  Coronary, Arteriosclerosis  (ICD-414.00) 4)  Palpitations  (ICD-785.1) 5)  Chest Pain  (ICD-786.50) 6)  Back Pain, Thoracic Region, Left  (ICD-724.1) 7)  Symptom, Headache  (ICD-784.0) 8)  Symptom, Malaise and Fatigue Nec  (ICD-780.79) 9)  Back Pain  (ICD-724.5) 10)  Vertigo Nos or Dizziness  (ICD-780.4) 11)  Tension Headache  (ICD-307.81) 12)  Reflux Esophagitis  (ICD-530.11) 13)  Obesity, Nos  (ICD-278.00) 14)  Leg Pain or Knee Pain  (ICD-729.5) 15)  Hypothyroidism, Unspecified  (ICD-244.9) 16)  Hypercholesterolemia  (ICD-272.0) 17)  Djd, Unspecified  (ICD-715.90) 18)  Diverticulosis of Colon  (ICD-562.10) 19)  Diabetes Mellitus, II, Complications   (ICD-250.92) 20)  Carpal Tunnel Syndrome  (ICD-354.0) 21)  Apnea, Sleep  (ICD-780.57) 22)  Edema  (ICD-782.3) 23)  Sore Throat  (ICD-462)  Current Medications (verified): 1)  Bayer Childrens Aspirin 81 Mg Chew (Aspirin) .... Take 1 Tablet By Mouth Once A Day 2)  Fexofenadine Hcl 180 Mg Tabs (Fexofenadine Hcl) .... Take 1 Tablet By Mouth Once A Day As Needed Hives 3)  Glipizide 10 Mg Tb24 (Glipizide) .... Take 1 Tablet By Mouth Once A Day 4)  Hydrochlorothiazide 25 Mg Tabs (Hydrochlorothiazide) .... Take 1 Tablet By Mouth Once A Day 5)  Januvia 100 Mg Tabs (Sitagliptin Phosphate) .... Take 1 Tablet By Mouth Once A Day 6)  Meclizine Hcl 25 Mg Tabs (Meclizine Hcl) .... Take 1 Tablet By Mouth Every Six Hours As Needed Dizziness 7)  Metformin Hcl 1000 Mg Tabs (Metformin Hcl) .... One By Mouth Two Times A Day 8)  Onetouch Test  Strp (Glucose Blood) .Marland Kitchen.. 1 Strip Three Times A Day 9)  Levothyroxine Sodium 112 Mcg Tabs (Levothyroxine Sodium) .... One By Mouth Daily 10)  Metoprolol Succinate 50 Mg Xr24h-Tab (Metoprolol Succinate) .... Take One Tablet By Mouth Daily 11)  Betamethasone Dipropionate Aug 0.05 % Crea (Aug Betamethasone Dipropionate) .... Apply To Affected Area Two Times A Day: Do Not Use On Face  Allergies (verified): 1)  Actos (Pioglitazone Hcl) 2)  Amoxicillin (Amoxicillin) 3)  Cozaar (Losartan Potassium) 4)  Lisinopril (Lisinopril) 5)  Sulfamethoxazole (Sulfamethoxazole)  Past History:  Past  Medical History: Last updated: 11/02/2008 Current Problems:  Atypical chest Pain normal cath in 2004 and normal myouve in 2007 HYPERTENSION, BENIGN SYSTEMIC (ICD-401.1) PALPITATIONS (ICD-785.1) Hyperlipidemia BACK PAIN, THORACIC REGION, LEFT (ICD-724.1) SYMPTOM, HEADACHE (ICD-784.0) SYMPTOM, MALAISE AND FATIGUE NEC (ICD-780.79) BACK PAIN (ICD-724.5) VERTIGO NOS OR DIZZINESS (ICD-780.4) TENSION HEADACHE (ICD-307.81) REFLUX ESOPHAGITIS (ICD-530.11) OBESITY, NOS (ICD-278.00) LEG PAIN  OR KNEE PAIN (ICD-729.5) HYPOTHYROIDISM, UNSPECIFIED (ICD-244.9) HYPERCHOLESTEROLEMIA (ICD-272.0) DJD, UNSPECIFIED (ICD-715.90) DIVERTICULOSIS OF COLON (ICD-562.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 ANA uncertain significance, , minimal CAD by cath 5/04, nl EF,  spinal stenosis by MRI, sx poorly correlate 12/03  Past Surgical History: Last updated: 04/25/2006 ABI=0.87 (mild obstruction) - 05/01/2001, Barium Enema -, bilateral carpal tunnel surgery in 2003 - 04/21/2002, bone scan -, breast biopsy 1985 -, Cardiac Cath - 07/14/2002, Cardiac Cath 1999 normal -, CXR -, ECG -, Echo -, Lipid TC=211, LDL=130, HDL=54, TG=134  01/29/06 - 65/08/8467, PFT -, Xray - Spine -  Family History: Last updated: 04/25/2006 - Ca, CVA, Etohism, + CAD, DM, meningioma  Social History: Last updated: 05/20/2006 nonsmoker, nondrinker;  healthy diet; no exercise;  works in Insurance account manager  Review of Systems       Denies fever, malais, weight loss, blurry vision, decreased visual acuity, cough, sputum, emoptysis, pleuritic pain, heartburn, abdominal pain, melena, lower extremity edema, claudication, or rash.   Vital Signs:  Patient profile:   70 year old female Height:      65 inches Weight:      275 pounds BMI:     45.93 Pulse rate:   92 / minute Resp:     14 per minute BP sitting:   133 / 74  (left arm)  Vitals Entered By: Kem Parkinson (April 04, 2010 2:00 PM)  Physical Exam  General:  Affect appropriate Healthy:  appears stated age HEENT: normal Neck supple with no adenopathy JVP normal no bruits no thyromegaly Lungs clear with no wheezing and good diaphragmatic motion Heart:  S1/S2 no murmur,rub, gallop or click PMI normal Abdomen: benighn, BS positve, no tenderness, no AAA no bruit.  No HSM or HJR Distal pulses intact with  no bruits No edema Neuro non-focal Skin warm and dry    Impression & Recommendations:  Problem # 1:  HYPERTENSION, BENIGN SYSTEMIC (ICD-401.1) Well controlled including on home readings.  Unable to take ARB/ACE The following medications were removed from the medication list:    Doxazosin Mesylate 2 Mg Tabs (Doxazosin mesylate) .Marland Kitchen... 1 qd Her updated medication list for this problem includes:    Bayer Childrens Aspirin 81 Mg Chew (Aspirin) .Marland Kitchen... Take 1 tablet by mouth once a day    Hydrochlorothiazide 25 Mg Tabs (Hydrochlorothiazide) .Marland Kitchen... Take 1 tablet by mouth once a day    Metoprolol Succinate 50 Mg Xr24h-tab (Metoprolol succinate) .Marland Kitchen... Take one tablet by mouth daily  Problem # 2:  CHEST PAIN (ICD-786.50) Likely related to stress  ECG poor R wave progressoin PAC;s.  Cotninue ASA and BB.  Myovue Her updated medication list for this problem includes:    Bayer Childrens Aspirin 81 Mg Chew (Aspirin) .Marland Kitchen... Take 1 tablet by mouth once a day    Metoprolol Succinate 50 Mg Xr24h-tab (Metoprolol succinate) .Marland Kitchen... Take one tablet by mouth daily  Problem # 3:  PALPITATIONS (ICD-785.1) Continue BB PAC;s chronic and related to obesity and stress Her updated medication list for this problem includes:  Bayer Childrens Aspirin 81 Mg Chew (Aspirin) .Marland Kitchen... Take 1 tablet by mouth once a day    Metoprolol Succinate 50 Mg Xr24h-tab (Metoprolol succinate) .Marland Kitchen... Take one tablet by mouth daily  Orders: Echocardiogram (Echo)  Problem # 4:  HYPERCHOLESTEROLEMIA (ICD-272.0) Should be on statin.  Will forward to primary CHOL: 233 (02/02/2009)   LDL: 160 (02/02/2009)   HDL: 52 (02/02/2009)   TG: 104 (02/02/2009)  Problem # 5:  APNEA, SLEEP (ICD-780.57) Dyspnea.  Chronic obesity hypoventilation  F/U echo R/O pulmonary hypertension  Other Orders: Nuclear Stress Test (Nuc Stress Test)  Patient Instructions: 1)  Your physician wants you to follow-up in:6 MONTHS   You will receive a reminder letter in  the mail two months in advance. If you don't receive a letter, please call our office to schedule the follow-up appointment. 2)  Your physician has requested that you have an echocardiogram.  Echocardiography is a painless test that uses sound waves to create images of your heart. It provides your doctor with information about the size and shape of your heart and how well your heart's chambers and valves are working.  This procedure takes approximately one hour. There are no restrictions for this procedure. 3)  Your physician has requested that you have an exercise stress myoview.  For further information please visit [email or web address]  Please follow instruction sheet, as given.

## 2010-04-13 NOTE — Progress Notes (Signed)
   Phone Note Call from Patient   Caller: Patient Call For: [phone] Summary of Call: Please call Vikki Ports or Randa Evens at your earliest convenience. Initial call taken by: Abundio Miu,  March 27, 2010 3:48 PM  Follow-up for Phone Call        Called and LM X3 Follow-up by: Doralee Albino MD,  April 05, 2010 11:36 AM

## 2010-04-13 NOTE — Consult Note (Signed)
Summary: [facility]   Imported By: De Nurse 03/24/2010 15:32:13  _____________________________________________________________________  External Attachment:    Type:   Image     Comment:   External Document

## 2010-04-14 ENCOUNTER — Ambulatory Visit: Payer: Self-pay

## 2010-04-17 ENCOUNTER — Telehealth (INDEPENDENT_AMBULATORY_CARE_PROVIDER_SITE_OTHER): Payer: Self-pay | Admitting: *Deleted

## 2010-04-18 ENCOUNTER — Ambulatory Visit (HOSPITAL_BASED_OUTPATIENT_CLINIC_OR_DEPARTMENT_OTHER): Payer: 59

## 2010-04-18 ENCOUNTER — Ambulatory Visit (HOSPITAL_COMMUNITY): Payer: 59 | Attending: Cardiovascular Disease

## 2010-04-18 ENCOUNTER — Other Ambulatory Visit: Payer: Self-pay | Admitting: Family Medicine

## 2010-04-18 DIAGNOSIS — I2510 Atherosclerotic heart disease of native coronary artery without angina pectoris: Secondary | ICD-10-CM | POA: Insufficient documentation

## 2010-04-18 DIAGNOSIS — E669 Obesity, unspecified: Secondary | ICD-10-CM | POA: Insufficient documentation

## 2010-04-18 DIAGNOSIS — R002 Palpitations: Secondary | ICD-10-CM | POA: Insufficient documentation

## 2010-04-18 DIAGNOSIS — R0989 Other specified symptoms and signs involving the circulatory and respiratory systems: Secondary | ICD-10-CM

## 2010-04-18 DIAGNOSIS — R072 Precordial pain: Secondary | ICD-10-CM | POA: Insufficient documentation

## 2010-04-18 DIAGNOSIS — E119 Type 2 diabetes mellitus without complications: Secondary | ICD-10-CM | POA: Insufficient documentation

## 2010-04-18 DIAGNOSIS — R0609 Other forms of dyspnea: Secondary | ICD-10-CM | POA: Insufficient documentation

## 2010-04-18 NOTE — Telephone Encounter (Signed)
Please review and refill

## 2010-04-19 ENCOUNTER — Ambulatory Visit
Admission: RE | Admit: 2010-04-19 | Discharge: 2010-04-19 | Disposition: A | Discharge status: Home / Self Care | Payer: 59 | Source: Ambulatory Visit | Attending: Family Medicine | Admitting: Family Medicine

## 2010-04-19 DIAGNOSIS — Z1239 Encounter for other screening for malignant neoplasm of breast: Secondary | ICD-10-CM

## 2010-04-21 ENCOUNTER — Ambulatory Visit: Payer: Self-pay | Admitting: Cardiovascular Disease

## 2010-04-24 ENCOUNTER — Telehealth (INDEPENDENT_AMBULATORY_CARE_PROVIDER_SITE_OTHER): Payer: Self-pay | Admitting: Radiology

## 2010-04-25 ENCOUNTER — Encounter: Payer: Self-pay | Admitting: Cardiovascular Disease

## 2010-04-25 ENCOUNTER — Ambulatory Visit (HOSPITAL_COMMUNITY): Payer: 59 | Attending: Cardiovascular Disease

## 2010-04-25 DIAGNOSIS — E119 Type 2 diabetes mellitus without complications: Secondary | ICD-10-CM

## 2010-04-25 DIAGNOSIS — R0789 Other chest pain: Secondary | ICD-10-CM

## 2010-04-25 DIAGNOSIS — R079 Chest pain, unspecified: Secondary | ICD-10-CM | POA: Insufficient documentation

## 2010-04-25 NOTE — Progress Notes (Signed)
Summary: Nuclear Pre-Procedure  Phone Note Outgoing Call Call back at [facility] Phone [phone]   Call placed by: Stanton Kidney, EMT-P,  April 17, 2010 2:57 PM Action Taken: Phone Call Completed Summary of Call: Left message with information on Myoview Information Sheet (see scanned document for details). Stanton Kidney, EMT-P  April 17, 2010 2:57 PM     Nuclear Med Background Indications for Stress Test: Evaluation for Ischemia   History: Echo, Heart Catheterization, Myocardial Perfusion Study  History Comments: '04 Heart Cath: NL, EF= 55% '08 MPS: (-) ischemia, EF= 64% '09 Echo: EF= 60%  Symptoms: Chest Pressure, DOE, Palpitations    Nuclear Pre-Procedure Cardiac Risk Factors: Family History - CAD, History of Smoking, Hypertension, Lipids, NIDDM, Obesity Height (in): 65

## 2010-04-27 ENCOUNTER — Encounter (INDEPENDENT_AMBULATORY_CARE_PROVIDER_SITE_OTHER): Payer: Self-pay | Admitting: *Deleted

## 2010-04-28 ENCOUNTER — Ambulatory Visit (INDEPENDENT_AMBULATORY_CARE_PROVIDER_SITE_OTHER): Payer: 59 | Admitting: Family Medicine

## 2010-04-28 ENCOUNTER — Ambulatory Visit: Payer: Self-pay | Admitting: Gastroenterology

## 2010-04-28 ENCOUNTER — Encounter: Payer: Self-pay | Admitting: Family Medicine

## 2010-04-28 VITALS — BP 160/90 | HR 70 | Temp 97.8°F | Ht 66.0 in | Wt 274.6 lb

## 2010-04-28 DIAGNOSIS — E7800 Pure hypercholesterolemia, unspecified: Secondary | ICD-10-CM

## 2010-04-28 DIAGNOSIS — E1165 Type 2 diabetes mellitus with hyperglycemia: Secondary | ICD-10-CM

## 2010-04-28 DIAGNOSIS — E118 Type 2 diabetes mellitus with unspecified complications: Secondary | ICD-10-CM

## 2010-04-28 DIAGNOSIS — I10 Essential (primary) hypertension: Secondary | ICD-10-CM

## 2010-04-28 DIAGNOSIS — IMO0002 Reserved for concepts with insufficient information to code with codable children: Secondary | ICD-10-CM

## 2010-04-28 LAB — CONVERTED CEMR LAB
ALT: 20 units/L (ref 0–35)
AST: 16 units/L (ref 0–37)
Albumin: 4.6 g/dL (ref 3.5–5.2)
Alkaline Phosphatase: 72 units/L (ref 39–117)
BUN: 14 mg/dL (ref 6–23)
CO2: 26 meq/L (ref 19–32)
Calcium: 9.6 mg/dL (ref 8.4–10.5)
Chloride: 100 meq/L (ref 96–112)
Cholesterol: 257 mg/dL — ABNORMAL HIGH (ref 0–200)
Creatinine, Ser: 0.72 mg/dL (ref 0.40–1.20)
Glucose, Bld: 238 mg/dL — ABNORMAL HIGH (ref 70–99)
HDL: 47 mg/dL (ref >39)
LDL Cholesterol: 187 mg/dL — ABNORMAL HIGH (ref 0–99)
Potassium: 4.1 meq/L (ref 3.5–5.3)
Sodium: 136 meq/L (ref 135–145)
Total Bilirubin: 0.4 mg/dL (ref 0.3–1.2)
Total CHOL/HDL Ratio: 5.5
Total Protein: 7.6 g/dL (ref 6.0–8.3)
Triglycerides: 116 mg/dL (ref <150)
VLDL: 23 mg/dL (ref 0–40)

## 2010-04-28 LAB — LIPID PANEL
Cholesterol: 257 mg/dL — ABNORMAL HIGH (ref 0–200)
HDL: 47 mg/dL (ref >39)
LDL Cholesterol: 187 mg/dL — ABNORMAL HIGH (ref 0–99)
Total CHOL/HDL Ratio: 5.5 Ratio
Triglycerides: 116 mg/dL (ref <150)
VLDL: 23 mg/dL (ref 0–40)

## 2010-04-28 LAB — COMPREHENSIVE METABOLIC PANEL
ALT: 20 U/L (ref 0–35)
AST: 16 U/L (ref 0–37)
Albumin: 4.6 g/dL (ref 3.5–5.2)
Alkaline Phosphatase: 72 U/L (ref 39–117)
BUN: 14 mg/dL (ref 6–23)
CO2: 26 mEq/L (ref 19–32)
Calcium: 9.6 mg/dL (ref 8.4–10.5)
Chloride: 100 mEq/L (ref 96–112)
Creat: 0.72 mg/dL (ref 0.40–1.20)
Glucose, Bld: 238 mg/dL — ABNORMAL HIGH (ref 70–99)
Potassium: 4.1 mEq/L (ref 3.5–5.3)
Sodium: 136 mEq/L (ref 135–145)
Total Bilirubin: 0.4 mg/dL (ref 0.3–1.2)
Total Protein: 7.6 g/dL (ref 6.0–8.3)

## 2010-04-28 LAB — POCT GLYCOSYLATED HEMOGLOBIN (HGB A1C): Hemoglobin A1C: 8.5

## 2010-04-28 MED ORDER — METOPROLOL SUCCINATE ER 50 MG PO TB24: 50.0000 mg | ORAL_TABLET | Freq: Every day | ORAL | Status: DC

## 2010-04-28 MED ORDER — METOPROLOL SUCCINATE ER 25 MG PO TB24: 25.0000 mg | ORAL_TABLET | Freq: Every day | ORAL | Status: DC

## 2010-04-28 NOTE — Progress Notes (Signed)
  Subjective:    Patient ID: Courtney Pena, female    DOB: 28-Feb-1940, 70 y.o.   MRN: [id number]  HPI BP up.  Not taking cardura Wt up from last visit but down from max Blood sugars are up on home testing Had mammogram last week results were nl birads 1 Had cards stress on Monday with cards    Review of Systems     Objective:   Physical Exam General normal.  Wt noted as above Lungs, clear Cardiac RRR without m or g        Assessment & Plan:

## 2010-05-01 MED ORDER — PRAVASTATIN SODIUM 20 MG PO TABS: 20.0000 mg | ORAL_TABLET | Freq: Every evening | ORAL | Status: DC

## 2010-05-01 NOTE — Progress Notes (Signed)
  Subjective:    Patient ID: Courtney Pena, female    DOB: Jan 09, 1941, 70 y.o.   MRN: [id number]  HPI Called and given results.  She will focus on diet for DM control.  Will agree to try another statin   Review of Systems     Objective:   Physical Exam        Assessment & Plan:

## 2010-05-01 NOTE — Assessment & Plan Note (Signed)
Due for A1C check.  I suspect it will be up.

## 2010-05-01 NOTE — Assessment & Plan Note (Signed)
Has been high in the past and intollerant of several statins.  She has been reluctant to try other statins.  Will recheck.

## 2010-05-01 NOTE — Progress Notes (Signed)
Addended by: Tivis Ringer on: 05/01/2010 11:16 AM   Modules accepted: Orders

## 2010-05-01 NOTE — Assessment & Plan Note (Signed)
BP up.  She has been intolerant Toprol 100 mg.  Will try toprol 75 mg dose

## 2010-05-02 ENCOUNTER — Encounter: Payer: Self-pay | Admitting: Gastroenterology

## 2010-05-02 ENCOUNTER — Other Ambulatory Visit: Payer: Self-pay | Admitting: Gastroenterology

## 2010-05-02 ENCOUNTER — Ambulatory Visit (INDEPENDENT_AMBULATORY_CARE_PROVIDER_SITE_OTHER): Payer: 59 | Admitting: Gastroenterology

## 2010-05-02 ENCOUNTER — Encounter (INDEPENDENT_AMBULATORY_CARE_PROVIDER_SITE_OTHER): Payer: Self-pay | Admitting: *Deleted

## 2010-05-02 ENCOUNTER — Other Ambulatory Visit: Payer: 59

## 2010-05-02 DIAGNOSIS — R1013 Epigastric pain: Secondary | ICD-10-CM | POA: Insufficient documentation

## 2010-05-02 DIAGNOSIS — E669 Obesity, unspecified: Secondary | ICD-10-CM

## 2010-05-02 DIAGNOSIS — K219 Gastro-esophageal reflux disease without esophagitis: Secondary | ICD-10-CM

## 2010-05-02 DIAGNOSIS — E1165 Type 2 diabetes mellitus with hyperglycemia: Secondary | ICD-10-CM

## 2010-05-02 DIAGNOSIS — E119 Type 2 diabetes mellitus without complications: Secondary | ICD-10-CM

## 2010-05-02 DIAGNOSIS — E118 Type 2 diabetes mellitus with unspecified complications: Secondary | ICD-10-CM

## 2010-05-02 LAB — FOLATE: Folate: 12.1 ng/mL (ref >5.9)

## 2010-05-02 LAB — CBC WITH DIFFERENTIAL/PLATELET
Basophils Absolute: 0.1 10*3/uL (ref 0.0–0.1)
Basophils Relative: 0.7 % (ref 0.0–3.0)
Eosinophils Absolute: 0.3 10*3/uL (ref 0.0–0.7)
Eosinophils Relative: 3.8 % (ref 0.0–5.0)
HCT: 35.7 % — ABNORMAL LOW (ref 36.0–46.0)
Hemoglobin: 12 g/dL (ref 12.0–15.0)
Lymphocytes Relative: 27.6 % (ref 12.0–46.0)
Lymphs Abs: 2 10*3/uL (ref 0.7–4.0)
MCHC: 33.7 g/dL (ref 30.0–36.0)
MCV: 87.1 fl (ref 78.0–100.0)
Monocytes Absolute: 0.4 10*3/uL (ref 0.1–1.0)
Monocytes Relative: 5.3 % (ref 3.0–12.0)
Neutro Abs: 4.5 10*3/uL (ref 1.4–7.7)
Neutrophils Relative %: 62.6 % (ref 43.0–77.0)
Platelets: 225 10*3/uL (ref 150.0–400.0)
RBC: 4.1 Mil/uL (ref 3.87–5.11)
RDW: 16.2 % — ABNORMAL HIGH (ref 11.5–14.6)
WBC: 7.2 10*3/uL (ref 4.5–10.5)

## 2010-05-02 LAB — BASIC METABOLIC PANEL
BUN: 16 mg/dL (ref 6–23)
CO2: 32 mEq/L (ref 19–32)
Calcium: 9.7 mg/dL (ref 8.4–10.5)
Chloride: 101 mEq/L (ref 96–112)
Creatinine, Ser: 0.8 mg/dL (ref 0.4–1.2)
GFR: 89.89 mL/min (ref >60.00)
Glucose, Bld: 231 mg/dL — ABNORMAL HIGH (ref 70–99)
Potassium: 4.1 mEq/L (ref 3.5–5.1)
Sodium: 140 mEq/L (ref 135–145)

## 2010-05-02 LAB — CONVERTED CEMR LAB: Tissue Transglutaminase Ab, IgA: 3.1 U

## 2010-05-02 LAB — HEPATIC FUNCTION PANEL
ALT: 27 U/L (ref 0–35)
AST: 22 U/L (ref 0–37)
Albumin: 4 g/dL (ref 3.5–5.2)
Alkaline Phosphatase: 66 U/L (ref 39–117)
Bilirubin, Direct: 0.1 mg/dL (ref 0.0–0.3)
Total Bilirubin: 0.6 mg/dL (ref 0.3–1.2)
Total Protein: 7.5 g/dL (ref 6.0–8.3)

## 2010-05-02 LAB — IBC PANEL
Iron: 81 ug/dL (ref 42–145)
Saturation Ratios: 16.5 % — ABNORMAL LOW (ref 20.0–50.0)
Transferrin: 351.6 mg/dL (ref 212.0–360.0)

## 2010-05-02 LAB — TSH: TSH: 1.36 u[IU]/mL (ref 0.35–5.50)

## 2010-05-02 LAB — HEMOGLOBIN A1C: Hgb A1c MFr Bld: 9.4 % — ABNORMAL HIGH (ref 4.6–6.5)

## 2010-05-02 LAB — FERRITIN: Ferritin: 21.6 ng/mL (ref 10.0–291.0)

## 2010-05-02 LAB — IGA: IgA: 208 mg/dL (ref 68–378)

## 2010-05-02 LAB — VITAMIN B12: Vitamin B-12: 275 pg/mL (ref 211–911)

## 2010-05-04 NOTE — Progress Notes (Signed)
Summary: Nuclear Pre-Procedure  Phone Note Outgoing Call Call back at Work Phone [phone]   Call placed by: Stanton Kidney, EMT-P,  April 24, 2010 11:02 AM Call placed to: Patient Action Taken: Phone Call Completed Reason for Call: Confirm/change Appt Summary of Call: Left message with information on [employer] Information Sheet (see scanned document for details). Stanton Kidney, EMT-P  April 24, 2010 11:02 AM      Nuclear Med Background Indications for Stress Test: Evaluation for Ischemia   History: Echo, Heart Catheterization, Myocardial Perfusion Study  History Comments: '04 Heart Cath: NL, EF= 55% '08 MPS: (-) ischemia, EF= 64% '09 Echo: EF= 60%  Symptoms: Chest Pressure, DOE, Palpitations    Nuclear Pre-Procedure Cardiac Risk Factors: Family History - CAD, History of Smoking, Hypertension, Lipids, NIDDM, Obesity Height (in): 65

## 2010-05-04 NOTE — Assessment & Plan Note (Signed)
Summary: Cardiology Nuclear Testing  Nuclear Med Background Indications for Stress Test: Evaluation for Ischemia   History: Echo, Heart Catheterization, Myocardial Perfusion Study  History Comments: '04 Cath:normal, EF=55%; '08 MPS:no ischemia, EF=64%; 04/18/10 Echo:EF=55-60%  Symptoms: Chest Pain, Chest Pressure, Dizziness, DOE, Palpitations, Rapid HR, SOB  Symptoms Comments: Last episode of CP:2 weeks ago.   Nuclear Pre-Procedure Cardiac Risk Factors: Family History - CAD, History of Smoking, Hypertension, Lipids, NIDDM, Obesity Caffeine/Decaff Intake: None NPO After: 10:00 PM Lungs: Clear.  O2 Sat 94% on RA. IV 0.9% NS with Angio Cath: 20g     IV Site: R Antecubital IV Started by: Irean Hong, RN Chest Size (in) 44     Cup Size DD     Height (in): 65 Weight (lb): 275 BMI: 45.93  Nuclear Med Study 1 or 2 day study:  2 day     Stress Test Type:  Treadmill/Lexiscan Reading MD:  Charlton Haws, MD     Referring MD:  Charlton Haws, MD Resting Radionuclide:  Technetium 42m Tetrofosmin     Resting Radionuclide Dose:  33 mCi  Stress Radionuclide:  Technetium 77m Tetrofosmin     Stress Radionuclide Dose:  33 mCi   Stress Protocol Exercise Time (min):  2:00 min     Max HR:  100 bpm     Predicted Max HR:  150 bpm  Max Systolic BP: 149 mm Hg     Percent Max HR:  66.67 %Rate Pressure Product:  40981  Lexiscan: 0.4 mg   Stress Test Technologist:  Rea [employer], CMA-N     Nuclear Technologist:  Domenic Polite, CNMT  Rest Procedure  Myocardial perfusion imaging was performed at rest 45 minutes following the intravenous administration of Technetium 65m Tetrofosmin.  Stress Procedure  The patient received IV Lexiscan 0.4 mg over 15-seconds with concurrent low level exercise and then Technetium 20m Tetrofosmin was injected at 30-seconds.  There were no significant changes with infusion, other than occasional PVC's and PJC's.  Quantitative spect images were obtained after a 45 minute  delay.  QPS Raw Data Images:  Normal; no motion artifact; normal heart/lung ratio. Stress Images:  Normal homogeneous uptake in all areas of the myocardium. Rest Images:  Normal homogeneous uptake in all areas of the myocardium. Subtraction (SDS):  Normal Transient Ischemic Dilatation:  1.09  (Normal <1.22)  Lung/Heart Ratio:  0.30  (Normal <0.45)  Quantitative Gated Spect Images QGS EDV:  131 ml QGS ESV:  50 ml QGS EF:  62 % QGS cine images:  normal  Findings Normal nuclear study      Overall Impression  Exercise Capacity: Lexiscan with no exercise. BP Response: Normal blood pressure response. Clinical Symptoms: Dyspnea ECG Impression: No significant ST segment change suggestive of ischemia. Overall Impression: Normal stress nuclear study.  Appended Document: Cardiology Nuclear Testing nuclear looks fine  Appended Document: Cardiology Nuclear Testing Left message to call back    Appended Document: Cardiology Nuclear Testing pt aware of results

## 2010-05-04 NOTE — Letter (Signed)
Summary: Appointment - Missed  [facility], Main Office  [address]   [place], [place] [address]   Phone: [phone]  Fax: [phone]          April 27, 2010 MRN: [id number]      Courtney Pena [address] [place], [place]  [address]      Dear [name],   Our records indicate you missed your appointment on April 21, 2010 at 11:15am with Dr. Eden Emms. It is very important that we reach you to reschedule this appointment. We look forward to participating in your health care needs. Please contact us at the number listed above at your earliest convenience to reschedule this appointment.     Sincerely,  Roe Coombs  [employer] Scheduling Team

## 2010-05-09 ENCOUNTER — Encounter: Payer: Self-pay | Admitting: Home Health Services

## 2010-05-09 NOTE — Letter (Signed)
Summary: [facility] Instructions  [facility]  [address] [place], [place] [address]   Phone: [phone]  Fax: [phone]       DOT SPLINTER    04/14/1940    MRN: [id number]        Procedure Day /Date: 06/07/2010 Wednesday     Arrival Time: 7:30am     Procedure Time: 8:30am     Location of Procedure:                    X  [facility] (4th Floor)   PREPARATION FOR COLONOSCOPY WITH MOVIPREP   Starting 5 days prior to your procedure 06/02/2010 do not eat nuts, seeds, popcorn, corn, beans, peas,  salads, or any raw vegetables.  Do not take any fiber supplements (e.g. Metamucil, Citrucel, and Benefiber).  THE DAY BEFORE YOUR PROCEDURE         Tuesday 06/06/2010  1.  Drink clear liquids the entire day-NO SOLID FOOD  2.  Do not drink anything colored red or purple.  Avoid juices with pulp.  No orange juice.  3.  Drink at least 64 oz. (8 glasses) of fluid/clear liquids during the day to prevent dehydration and help the prep work efficiently.  CLEAR LIQUIDS INCLUDE: Water Jello Ice Popsicles Tea (sugar ok, no milk/cream) Powdered fruit flavored drinks Coffee (sugar ok, no milk/cream) Gatorade Juice: apple, white grape, white cranberry  Lemonade Clear bullion, consomm, broth Carbonated beverages (any kind) Strained chicken noodle soup Hard Candy                             4.  In the morning, mix first dose of MoviPrep solution:    Empty 1 Pouch A and 1 Pouch B into the disposable container    Add lukewarm drinking water to the top line of the container. Mix to dissolve    Refrigerate (mixed solution should be used within 24 hrs)  5.  Begin drinking the prep at 5:00 p.m. The MoviPrep container is divided by 4 marks.   Every 15 minutes drink the solution down to the next mark (approximately 8 oz) until the full liter is complete.   6.  Follow completed prep with 16 oz of clear liquid of your choice (Nothing red or purple).   Continue to drink clear liquids until bedtime.  7.  Before going to bed, mix second dose of MoviPrep solution:    Empty 1 Pouch A and 1 Pouch B into the disposable container    Add lukewarm drinking water to the top line of the container. Mix to dissolve    Refrigerate  THE DAY OF YOUR PROCEDURE      Wednesday 06/07/2010  Beginning at 3:30am (5 hours before procedure):         1. Every 15 minutes, drink the solution down to the next mark (approx 8 oz) until the full liter is complete.  2. Follow completed prep with 16 oz. of clear liquid of your choice.    3. You may drink clear liquids until 6:30am (2 HOURS BEFORE PROCEDURE).   MEDICATION INSTRUCTIONS  Unless otherwise instructed, you should take regular prescription medications with a small sip of water   as early as possible the morning of your procedure.  Diabetic patients - see separate instructions.          OTHER INSTRUCTIONS  You will need a responsible adult at least 70 years  of age to accompany you and drive you home.   This person must remain in the waiting room during your procedure.  Wear loose fitting clothing that is easily removed.  Leave jewelry and other valuables at home.  However, you may wish to bring a book to read or  an iPod/MP3 player to listen to music as you wait for your procedure to start.  Remove all body piercing jewelry and leave at home.  Total time from sign-in until discharge is approximately 2-3 hours.  You should go home directly after your procedure and rest.  You can resume normal activities the  day after your procedure.  The day of your procedure you should not:   Drive   Make legal decisions   Operate machinery   Drink alcohol   Return to work  You will receive specific instructions about eating, activities and medications before you leave.    The above instructions have been reviewed and explained to me by   _______________________    I fully understand  and can verbalize these instructions _____________________________ Date _________

## 2010-05-09 NOTE — Letter (Signed)
Summary: Diabetic Instructions  [facility]  [address] [place], [place] [address]   Phone: [phone]  Fax: [phone]    Courtney Pena [birth year] MRN: [id number]   X   ORAL DIABETIC MEDICATION INSTRUCTIONS  The day before your procedure:   Take your diabetic pill as you do normally  The day of your procedure:   Do not take your diabetic pill    We will check your blood sugar levels during the admission process and again in Recovery before discharging you home

## 2010-05-09 NOTE — Assessment & Plan Note (Signed)
Summary: "STOMACH ISSUES".Lennon Alstrom.. [employer]//SCH WITH PT//MEDLIST//CX POL ADIV...    History of Present Illness Visit Type: new patient  Primary GI MD: Sheryn Bison MD FACP FAGA Primary Provider: Doralee Albino MD Requesting Provider: na Chief Complaint: Pt c/o upper abd pain, and GERD problems in the past  History of Present Illness:   Very pleasant 70 year old African American female self referred for evaluation upper abdominal discomfort described as a full pressing sensation present for many years which will come and go every several weeks and last several weeks in duration without real precipitating or alleviating elements. She denies true reflux symptoms or any hepatobiliary complaints. Records reviewed previously from Dr. Loreta Ave including previous endoscopy colonoscopy several years ago, upper bowel ultrasound exam, and labs. She was diagnosed as having a small hiatal hernia was briefly on PPI medications. There is no history of dysphagia or esophageal stricturing.  She has some" fluttering" this had recent negative cardiac evaluation by Dr. Charlton Haws. This was reviewed in her chart today. She does take frequent NSAIDs and a daily  81 mg aspirin tablet. She denies lower gastrointestinal hepatobiliary complaints, anorexia, weight loss, nausea and vomiting. 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 She denies abuse of alcohol or cigarettes or any history of hepatitis or pancreatitis. Family history is noncontributory.     GI Review of Systems    Reports abdominal pain, acid reflux, and  heartburn.     Location of  Abdominal pain: upper abdomen.    Denies belching, bloating, chest pain, dysphagia with liquids, dysphagia with solids, loss of appetite, nausea, vomiting, vomiting blood, weight loss, and  weight gain.        Denies anal fissure, black tarry stools, change in bowel habit, constipation, diarrhea, diverticulosis, fecal incontinence, heme positive stool,  hemorrhoids, irritable bowel syndrome, jaundice, light color stool, liver problems, rectal bleeding, and  rectal pain.    Current Medications (verified): 1)  Bayer Childrens Aspirin 81 Mg Chew (Aspirin) .... Take 1 Tablet By Mouth Once A Day 2)  Fexofenadine Hcl 180 Mg Tabs (Fexofenadine Hcl) .... Take 1 Tablet By Mouth Once A Day As Needed Hives 3)  Glipizide 10 Mg Tb24 (Glipizide) .... Take 1 Tablet By Mouth Once A Day 4)  Hydrochlorothiazide 25 Mg Tabs (Hydrochlorothiazide) .... Take 1 Tablet By Mouth Once A Day 5)  Januvia 100 Mg Tabs (Sitagliptin Phosphate) .... Take 1 Tablet By Mouth Once A Day 6)  Meclizine Hcl 25 Mg Tabs (Meclizine Hcl) .... Take 1 Tablet By Mouth Every Six Hours As Needed Dizziness 7)  Metformin Hcl 1000 Mg Tabs (Metformin Hcl) .... One By Mouth Two Times A Day 8)  Onetouch Test  Strp (Glucose Blood) .Marland Kitchen.. 1 Strip Three Times A Day 9)  Levothyroxine Sodium 112 Mcg Tabs (Levothyroxine Sodium) .... One By Mouth Daily 10)  Metoprolol Succinate 50 Mg Xr24h-Tab (Metoprolol Succinate) .... Take One Tablet By Mouth Daily 11)  Betamethasone Dipropionate Aug 0.05 % Crea (Aug Betamethasone Dipropionate) .... Apply To Affected Area Two Times A Day: Do Not Use On Face  Allergies (verified): 1)  Actos (Pioglitazone Hcl) 2)  Amoxicillin (Amoxicillin) 3)  Cozaar (Losartan Potassium) 4)  Lisinopril (Lisinopril) 5)  Sulfamethoxazole (Sulfamethoxazole)  Past History:  Past medical, surgical, family and social histories (including risk factors) reviewed for relevance to current acute and chronic problems.  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 ABI=0.87 (mild obstruction) - 05/01/2001, Barium Enema -, bilateral carpal tunnel surgery in 2003 - 04/21/2002, bone scan -, breast biopsy 1985 -, Cardiac Cath - 07/14/2002, Cardiac Cath 1999 normal -, CXR -, ECG -, Echo -, Lipid TC=211, LDL=130, HDL=54, TG=134  01/29/06 - 16/02/958, PFT -, Xray - Spine -  Family History: Reviewed history from 04/25/2006 and no changes required. - Ca, CVA, Etohism, + CAD, DM, meningioma No FH of Colon Cancer:  Social History: Reviewed history from 05/20/2006 and no changes required. Works in Insurance account manager  Married Childern healthy diet; no exercise; Patient is a former smoker.  Alcohol Use - no Smoking Status:  quit  Review of Systems       The patient complains of arthritis/joint pain.  The patient denies allergy/sinus, anemia, anxiety-new, back pain, blood in urine, breast changes/lumps, change in vision, confusion, cough, coughing up blood, depression-new, fainting, fatigue, fever, headaches-new, hearing problems, heart murmur, heart rhythm changes, itching, menstrual pain, muscle pains/cramps, night sweats, nosebleeds, pregnancy symptoms, shortness of breath, skin rash, sleeping problems, sore throat, swelling of feet/legs, swollen lymph glands, thirst - excessive , urination - excessive , urination changes/pain,  urine leakage, vision changes, and voice change.    Vital Signs:  Patient profile:   70 year old female Height:      65 inches Weight:      274 pounds BMI:     45.76 BSA:     2.26 Pulse rate:   92 / minute Pulse rhythm:   regular BP sitting:   132 / 84  (left arm) Cuff size:   large  Vitals Entered By: Ok Anis CMA (May 02, 2010 2:29 PM)  Physical Exam  General:  Well developed, well nourished, no acute distress.obese.   Head:  Normocephalic and atraumatic. Eyes:  PERRLA, no icterus.exam deferred to patient's ophthalmologist.   Neck:  Supple; no masses or thyromegaly. Lungs:  Clear throughout to auscultation. Heart:  Regular rate and rhythm; no murmurs, rubs,  or bruits. Abdomen:  Soft, nontender and nondistended. No masses, hepatosplenomegaly or hernias noted. Normal bowel sounds.obese.   Rectal:  deferred until time of colonoscopy.   Msk:  Symmetrical with no gross deformities. Normal posture.arthritic changes.   Extremities:  No clubbing, cyanosis, edema or deformities noted. Neurologic:  Alert and  oriented x4;  grossly normal neurologically. Cervical Nodes:  No significant cervical adenopathy. Psych:  Alert and cooperative. Normal mood and affect.   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 I have placed her on daily Dexilant 60 mg along with standard antireflux maneuvers and dietary modifications. Outpatient endoscopy as been ordered her convenience. I have also ordered a technetium gastric emptying scan to exclude gastroparesis. Repeat labs also ordered included an anemia profile.  Problem #  2:  OBESITY, NOS (ICD-278.00) Assessment: Deteriorated we discussed dietary therapy and aerobic exercise program. She is not a good candidate for bariatric surgery. We will check her liver enzymes to be sure she does not have  Nash syndrome. I suspect her diabetes is under poor control, I have ordered hemoglobin A1c also.  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 Time of endoscopy were performed small bowel biopsy to exclude celiac disease, and also check for H. pylori infection.  Problem # 4:  SCREENING COLORECTAL-CANCER (ICD-V76.51) Assessment: Unchanged Dr. Trilby Drummer colonoscopy from 2005 requested followup exam in 5 years. We'll schedule this procedure with her endoscopy and make appropriate changes in her diabetic medications for this procedure.  Problem # 5:  HYPERTENSION, BENIGN SYSTEMIC (ICD-401.1) Assessment: Improved blood pressure today normal at 132/84. Cardiac exam is unremarkable and she did have an occasional ectopic beat. Recent cardiac evaluation was unremarkable. Face has history of multiple drug allergies.  Problem # 6:  HYPOTHYROIDISM, UNSPECIFIED (ICD-244.9) Assessment: Improved continued l-thyroxine 112 micrograms a day as per primary care.  Other Orders: Colon/Endo (Colon/Endo) Gastric Emptying Scan (GES)  Patient Instructions: 1)  Copy sent to : Doralee Albino, MD 2)  [facility] Patient Information Guide given to patient.  3)  Colonoscopy and Flexible Sigmoidoscopy brochure given.  4)  Upper Endoscopy brochure given.  5)  Your procedure has been scheduled for 06/07/2010, please follow the seperate instructions.  6)  Your Gastric Empty Scan is scheduled for 05/19/2010, please follow the seperate instructions.  7)  Avoid foods high in acid content ( tomatoes, citrus juices, spicy foods) . Avoid eating within 3 to 4 hours of lying down or before exercising. Do not over eat; try smaller more frequent meals. Elevate head of bed four inches when sleeping.  8)  The medication list was reviewed and reconciled.  All changed / newly prescribed medications were explained.  A  complete medication list was provided to the patient / caregiver. Prescriptions: MOVIPREP 100 GM  SOLR (PEG-KCL-NACL-NASULF-NA ASC-C) As per prep instructions.  #1 x 0   Entered by:   Harlow Mares CMA (AAMA)   Authorized by:   Mardella Layman MD [facility]   Signed by:   Harlow Mares CMA (AAMA) on 05/02/2010   Method used:   Electronically to        [facility] [id number] (retail)       [address]       [place], [place]  [address]       Ph: [phone] or [phone]       Fax: [phone]   RxID:   [id number] DEXILANT 60 MG CPDR (DEXLANSOPRAZOLE) take one by mouth once daily  #30 x 6   Entered by:   Harlow Mares CMA (AAMA)   Authorized by:   Mardella Layman MD [facility]   Signed by:   Harlow Mares CMA (AAMA) on 05/02/2010   Method used:   [facility]        [facility] [id number]ret[facility]       [address]       [place], [place]  [address]       Ph: [phone] or [phone]       Fax: [phone]   RxID:   [id number]   Appended Document: Orders Update    Clinical Lists Changes  Orders: Added new Test order of TLB-A1C /  Hgb A1C (Glycohemoglobin) (83036-A1C) - Signed

## 2010-05-19 ENCOUNTER — Ambulatory Visit (HOSPITAL_COMMUNITY)
Admission: RE | Admit: 2010-05-19 | Discharge: 2010-05-19 | Disposition: A | Discharge status: Home / Self Care | Payer: 59 | Source: Ambulatory Visit | Attending: Gastroenterology | Admitting: Gastroenterology

## 2010-05-19 ENCOUNTER — Encounter (HOSPITAL_COMMUNITY): Payer: Self-pay

## 2010-05-19 DIAGNOSIS — R1013 Epigastric pain: Secondary | ICD-10-CM | POA: Insufficient documentation

## 2010-05-19 MED ORDER — TECHNETIUM TC 99M SULFUR COLLOID
2.0000 | Freq: Once | INTRAVENOUS | Status: AC | PRN
  Administered 2010-05-19: 2 via ORAL

## 2010-05-26 ENCOUNTER — Encounter: Payer: Self-pay | Admitting: Cardiovascular Disease

## 2010-06-07 ENCOUNTER — Ambulatory Visit (AMBULATORY_SURGERY_CENTER): Payer: 59 | Admitting: Gastroenterology

## 2010-06-07 ENCOUNTER — Encounter: Payer: Self-pay | Admitting: Gastroenterology

## 2010-06-07 ENCOUNTER — Other Ambulatory Visit: Payer: Self-pay | Admitting: Gastroenterology

## 2010-06-07 DIAGNOSIS — D126 Benign neoplasm of colon, unspecified: Secondary | ICD-10-CM

## 2010-06-07 DIAGNOSIS — K5730 Diverticulosis of large intestine without perforation or abscess without bleeding: Secondary | ICD-10-CM

## 2010-06-07 DIAGNOSIS — K2970 Gastritis, unspecified, without bleeding: Secondary | ICD-10-CM

## 2010-06-07 DIAGNOSIS — D129 Benign neoplasm of anus and anal canal: Secondary | ICD-10-CM

## 2010-06-07 DIAGNOSIS — R1013 Epigastric pain: Secondary | ICD-10-CM

## 2010-06-07 DIAGNOSIS — K219 Gastro-esophageal reflux disease without esophagitis: Secondary | ICD-10-CM

## 2010-06-07 DIAGNOSIS — K2990 Gastroduodenitis, unspecified, without bleeding: Secondary | ICD-10-CM

## 2010-06-07 DIAGNOSIS — K644 Residual hemorrhoidal skin tags: Secondary | ICD-10-CM

## 2010-06-07 DIAGNOSIS — D1330 Benign neoplasm of unspecified part of small intestine: Secondary | ICD-10-CM

## 2010-06-07 DIAGNOSIS — Z1211 Encounter for screening for malignant neoplasm of colon: Secondary | ICD-10-CM

## 2010-06-07 DIAGNOSIS — D128 Benign neoplasm of rectum: Secondary | ICD-10-CM

## 2010-06-07 HISTORY — DX: Residual hemorrhoidal skin tags: K64.4

## 2010-06-07 LAB — GLUCOSE, CAPILLARY
Glucose-Capillary: 205 mg/dL — ABNORMAL HIGH (ref 70–99)
Glucose-Capillary: 185 mg/dL — ABNORMAL HIGH (ref 70–99)

## 2010-06-07 MED ORDER — ESOMEPRAZOLE MAGNESIUM 40 MG PO CPDR: 40.0000 mg | DELAYED_RELEASE_CAPSULE | Freq: Every day | ORAL | Status: DC

## 2010-06-07 MED ORDER — SODIUM CHLORIDE 0.9 % IV SOLN: 500.0000 mL | INTRAVENOUS | Status: DC

## 2010-06-07 NOTE — Patient Instructions (Addendum)
[employer] reviewed with patient and care partner.  [facility] Discharge Instruction sheet signed by care partner.  Impressions/Recommendations:  Colonoscopy:  Diverticulosis (information sheet given) Polyp (information sheet given) External hemorrhoids (informatin sheet given) Repeat colonoscopy in 5-10 years.  High Fiber Diet (information sheet given)  Endoscopy:  Gastritis Hiatal Hernia  Avoid NSAIDS for two weeks Await biopsy results Continue Dexilant Start Nexium 40 mg, one daily.  Office visit in 3 weeks.

## 2010-06-08 ENCOUNTER — Telehealth: Payer: Self-pay | Admitting: *Deleted

## 2010-06-08 NOTE — Telephone Encounter (Signed)
Pt scheduled for return OV post ENDO per Dr Jarold Motto: 06/29/10 at 11:30am. Pt stated understanding.

## 2010-06-08 NOTE — Telephone Encounter (Signed)
No ID on answering machine, no message left 

## 2010-06-09 ENCOUNTER — Encounter: Payer: Self-pay | Admitting: Family Medicine

## 2010-06-09 ENCOUNTER — Ambulatory Visit (INDEPENDENT_AMBULATORY_CARE_PROVIDER_SITE_OTHER): Payer: 59 | Admitting: Family Medicine

## 2010-06-09 VITALS — BP 170/100 | HR 81 | Temp 97.6°F | Ht 66.0 in | Wt 272.2 lb

## 2010-06-09 DIAGNOSIS — N390 Urinary tract infection, site not specified: Secondary | ICD-10-CM

## 2010-06-09 DIAGNOSIS — K2970 Gastritis, unspecified, without bleeding: Secondary | ICD-10-CM | POA: Insufficient documentation

## 2010-06-09 DIAGNOSIS — K2990 Gastroduodenitis, unspecified, without bleeding: Secondary | ICD-10-CM

## 2010-06-09 DIAGNOSIS — R358 Other polyuria: Secondary | ICD-10-CM

## 2010-06-09 DIAGNOSIS — R3589 Other polyuria: Secondary | ICD-10-CM

## 2010-06-09 DIAGNOSIS — M1990 Unspecified osteoarthritis, unspecified site: Secondary | ICD-10-CM

## 2010-06-09 DIAGNOSIS — I10 Essential (primary) hypertension: Secondary | ICD-10-CM

## 2010-06-09 LAB — POCT UA - MICROSCOPIC ONLY

## 2010-06-09 LAB — POCT URINALYSIS DIPSTICK
Bilirubin, UA: NEGATIVE
Blood, UA: NEGATIVE
Glucose, UA: NEGATIVE
Ketones, UA: NEGATIVE
Nitrite, UA: NEGATIVE
Protein, UA: NEGATIVE
Spec Grav, UA: 1.025
Urobilinogen, UA: 0.2
pH, UA: 5.5

## 2010-06-09 MED ORDER — TRAMADOL HCL 50 MG PO TABS: 50.0000 mg | ORAL_TABLET | Freq: Four times a day (QID) | ORAL | Status: DC | PRN

## 2010-06-09 MED ORDER — LORATADINE 10 MG PO TABS: 10.0000 mg | ORAL_TABLET | Freq: Every day | ORAL | Status: DC

## 2010-06-09 MED ORDER — METOPROLOL SUCCINATE ER 100 MG PO TB24: 100.0000 mg | ORAL_TABLET | Freq: Every day | ORAL | Status: DC

## 2010-06-09 MED ORDER — CEPHALEXIN 500 MG PO CAPS: 500.0000 mg | ORAL_CAPSULE | Freq: Two times a day (BID) | ORAL | Status: AC

## 2010-06-09 NOTE — Progress Notes (Signed)
  Subjective:    Patient ID: Courtney Pena, female    DOB: 10-15-1940, 70 y.o.   MRN: [id number]  HPI Two major issues: Urinary frequency and suprapubic pressure.  No GI symptoms, fever or flank pain.  No vag discharge.  Monogamous.  This frequency is different than the polyuria of her DM BP up DM poor control noted last visit - she has altered her diet and lost two pounds. Seen by GI for epigastric discomfort.  EGD showed erosive gastritis.  Also had colonoscopy.  Advised no further NSAIDS   Review of Systems     Objective:   Physical Exam Abd benign with mild suprapubic, midline tenderness       Assessment & Plan:

## 2010-06-09 NOTE — Patient Instructions (Addendum)
1. I am increasing your metoprolol to 100 mg daily.  I sent a new prescription.  You can use up your old 49s and 28 as long as your daily dose adds up to 100. 2. You should buy some over the counter ferrous sulfate (iron) and take daily 3. You have a prescription for antibiotics for your urinary infection.  See me if the pressure does not clear up. 4. See me in 2-3 weeks to make sure your blood pressure is coming down. 5. You have a new pain pill for when your arthritis gets bad.

## 2010-06-09 NOTE — Assessment & Plan Note (Signed)
Urine suspicious for but not diagnostic of UTI.  Will Rx.  Return visit for pelvic exam if symptoms persist.

## 2010-06-09 NOTE — Assessment & Plan Note (Signed)
Poor control.  Increase metoprolol to 100 mg daily

## 2010-06-09 NOTE — Assessment & Plan Note (Signed)
Switch diclofenac to tramadol

## 2010-06-09 NOTE — Assessment & Plan Note (Signed)
DC diclofenac.  Cont ASA because of known CAD and risk factors for CVA.

## 2010-06-12 ENCOUNTER — Encounter: Payer: Self-pay | Admitting: Gastroenterology

## 2010-06-12 DIAGNOSIS — K219 Gastro-esophageal reflux disease without esophagitis: Secondary | ICD-10-CM

## 2010-06-12 DIAGNOSIS — R1013 Epigastric pain: Secondary | ICD-10-CM

## 2010-06-12 DIAGNOSIS — K3189 Other diseases of stomach and duodenum: Secondary | ICD-10-CM

## 2010-06-12 LAB — HELICOBACTER PYLORI SCREEN-BIOPSY: UREASE: NEGATIVE

## 2010-06-14 ENCOUNTER — Encounter: Payer: Self-pay | Admitting: Cardiovascular Disease

## 2010-06-14 ENCOUNTER — Telehealth: Payer: Self-pay | Admitting: Gastroenterology

## 2010-06-14 ENCOUNTER — Encounter: Payer: Self-pay | Admitting: Cardiology

## 2010-06-14 ENCOUNTER — Encounter: Payer: Self-pay | Admitting: Gastroenterology

## 2010-06-14 ENCOUNTER — Encounter: Payer: Self-pay | Admitting: *Deleted

## 2010-06-14 MED ORDER — HYDROCORTISONE ACE-PRAMOXINE 2.5-1 % RE CREA: TOPICAL_CREAM | Freq: Two times a day (BID) | RECTAL | Status: AC

## 2010-06-14 NOTE — Telephone Encounter (Signed)
Neg colon x2...hemorrhoids noted/..///Analpram cream..Marland Kitchen

## 2010-06-14 NOTE — Telephone Encounter (Signed)
Pt had EGD on 06/07/10 showing erosive gastoduodenitis, negative H. Pylori. Pt was to hold NSAIDS x 2weeks and OV in 3 weeks. On 06/09/10, she saw Dr Leveda Anna for possible UTI. She was instructed to start the ASA d/t her risk of MI and/or CVA, but she has not started the ASA back. She was ordered Keflex which  she hasn't started yet and she was to stop the Diclofenac which she did. This am, pt reports she had a normal stool and 1 hr later she had another stool with a lot of blood. There was dark old blood and BRB and there was BRB on the tissue. Pt has a f/u on 06/29/10. Please advise.

## 2010-06-14 NOTE — Telephone Encounter (Signed)
Notified pt she's had negative COLON's x2, but hemorrhoids were noted. We will call in Analpram cream for the hemorrhoids. If bleeding increases or she develops other s&s call us back; pt verbalized understanding.

## 2010-06-21 ENCOUNTER — Encounter: Payer: Self-pay | Admitting: *Deleted

## 2010-06-29 ENCOUNTER — Ambulatory Visit: Payer: 59 | Admitting: Gastroenterology

## 2010-07-06 ENCOUNTER — Other Ambulatory Visit: Payer: Self-pay | Admitting: Family Medicine

## 2010-07-06 NOTE — Telephone Encounter (Signed)
Refill request

## 2010-07-11 NOTE — Assessment & Plan Note (Signed)
[facility]                            CARDIOLOGY OFFICE NOTE   Courtney, Pena              MRN:          [id number]  DATE:11/06/2007                            DOB:          1940/03/06    [name] returns today for followup.  I have seen her for  previous atypical chest pain and multiple coronary risk factors.  The  last time I talked to her, we had a frank discussion about her weight  and how it is making her blood pressure and sugar hard to control.  She  has actually taken this to heart, seen a nutritionist.  She has lost 20  pounds.  I congratulated her on this.  Needless to say, her blood  pressure is much improved.  Her sugars, if anything, have been running  low and she may have to cut back on her oral hypoglycemics.   She was unable to tolerate the BiDil.  Blood pressure is well controlled  currently with the combination of diuretic low dose beta-blocker and  amlodipine.  The patient is actually quite excited.  She has good  insight into the kind of foods that were making her blood pressure and  sugars high before.   I think she has a good chance of losing another 20 pounds.   In regards to her symptoms, she is not having chest pain.  Her lower  extremity edema has improved.  She has less exertional dyspnea.   MEDICATIONS:  1. Amlodipine 10 mg a day.  2. Metformin 1 g b.i.d.  3. Hydrochlorothiazide 25 a day.  4. Aspirin a day.  5. Synthroid 100 mcg a day.  6. Januvia dose unknown daily.  7. Glipizide 10 a day.  8. Coenzyme Q.  9. Multivitamins.  10.Metoprolol 25 a day.   She is allergic to SULFA, PENICILLIN, ACE INHIBITORS, and ARBs.   PHYSICAL EXAMINATION:  VITAL SIGNS:  Otherwise negative.  Her weight is  down at 259, blood pressure is 125/78, pulse 70 and regular, respiratory  rate 14, and afebrile.  HEENT:  Unremarkable.  NECK:  Carotids are normal without bruit.  No lymphadenopathy,  thyromegaly,  or JVP elevation.  LUNGS:  Clear.  Good diaphragmatic motion.  No wheezing.  CARDIAC:  S1 and S2.  Normal heart sounds.  PMI normal.  ABDOMEN:  Benign.  Bowel sounds positive.  No AAA.  No tenderness.  No  bruit.  No hepatosplenomegaly.  No hepatojugular reflux.  No tenderness.  EXTREMITIES:  Distal pulses are intact.  No edema.  NEURO:  Nonfocal.  SKIN:  Warm and dry.  No muscular weakness.   IMPRESSION:  1. Previous atypical chest pain with nonischemic Myoview on September 30, 2006.  Normal cath in 2004.  Continue risk factor modification.  2. Hypertension.  Currently, well controlled with weight loss and low-      sodium diet.  Continue beta-blocker, hydrochlorothiazide, and      amlodipine.  3. Diabetes.  Needs a followup hemoglobin A1c in October, previously      it was in the 10 range.  I am sure it is  better.  I suspect she      will be able to come off her Januvia in 6 months if she continues      with her diet.  4. Lower extremity edema, improved.  Elevate legs at the end of the      day.  Continue low-salt diet and low-dose diuretic.   I will see her back in 6 months.      [name]. Eden Emms, MD, [facility]  Electronically Signed    PCN/MedQ  DD: 11/06/2007  DT: 11/06/2007  Job #: [id number]

## 2010-07-11 NOTE — Assessment & Plan Note (Signed)
[facility]                            CARDIOLOGY OFFICE NOTE   TIEA, MANNINEN              MRN:          [id number]  DATE:08/28/2007                            DOB:          01/25/1941    The patient seen today in followup.  She has significant obesity,  hypertension, and diabetes.  Unfortunately, she had some fatigue and  weakness with her higher dose diuretic and beta-blocker.  She did not  start her Bidil and had a long discussion with her regarding her dietary  indiscretions and need for weight loss both in terms of improved  diabetes control and hypertension.   We will refer her back to a nutritionist.   We will give her a script for Bidil.  If this is too expensive, she will  take hydralazine 50 b.i.d.  Her blood pressure is actually little better  today, but still too high.   REVIEW OF SYSTEMS:  Otherwise negative.  She is having exertional  dyspnea, which is due to her obesity.  She has had no cough, pleuritic  pain.  No PND or orthopnea.  She has mild lower extremity edema at  baseline.   PHYSICAL EXAMINATION:  Her weight is 282, blood pressure is 152/90,  pulse 77 and regular, respiratory rate 14, and afebrile.  HEENT: Unremarkable.  Carotids normal without bruit, no lymphadenopathy, thyromegaly, and no  JVP elevation.  LUNGS: Clear, good diaphragmatic motion.  No wheezing.  S1 and S2 with normal heart sounds.  PMI not palpable.  ABDOMEN: Benign.  Bowel sounds positive.  No bruit.  No tenderness.  No  hepatosplenomegaly.  No hepatojugular reflux.  Distal pulse are intact with trace edema.  NEURO: Nonfocal.  SKIN: Warm and dry.  No muscular weakness.   Medications are listed in the chart for blood pressure.  She is on,  1. Amlodipine 10 mg a day.  2. Metoprolol 25 mg a day.  3. Hydrochlorothiazide 25 a day.  4. Bidil to be added.   The patient is on diabetes medicine including metformin 2 g a day.  She  is on  Crestor for hyperlipidemia.   IMPRESSION:  1. Hypertension, poorly controlled.  Add Bidil or generic hydralazine      depending on which she can afford.  Follow up in 8-10 weeks.  Low-      salt diet.  2. Diabetes, poorly controlled.  Follow up with Dr. Leveda Anna.  We will      refer her to nutritionist to help with her food choices.  3. Hyperlipidemia.  Continue Crestor.  Lipid and liver profile in 6      months.  4. Dyspnea, functional secondary to being overweight.  2-D      echocardiogram done Jun 30, 2007 shows no significant valve disease      and an EF of 60%.     Courtney Pena. Eden Emms, MD, [facility]  Electronically Signed    PCN/MedQ  DD: 08/28/2007  DT: 08/28/2007  Job #: [id number]

## 2010-07-11 NOTE — Assessment & Plan Note (Signed)
[facility]                            CARDIOLOGY OFFICE NOTE   GHADA, ABBETT              MRN:          [id number]  DATE:09/19/2006                            DOB:          Mar 23, 1940    [name] returns today for followup. She had a non-ischemic  Myoview last  year. She has had atypical chest pain in the past. She has  multiple coronary risk factors including hypertension, diabetes and  hyperlipidemia. She continues to have palpitations and occasional PVCs.  I had asked her before to consider being on a beta-blocker. Apparently,  she spoke with Dr.  Leveda Anna and he seemed to want to keep her on Norvasc.  I do not think this was an ideal drug for her given her risk factors and  propensity to lower extremity edema.   She tells me she has been tried on ACE inhibitors in the past, but had a  rash with Altace. She is not sure what other drugs that she was tried  on. Her drug allergies that we have listed in the chart here only  include SULFA AND PENICILLIN.   She will talk to Dr.  Leveda Anna again regarding the possibility of starting  some sort of ARB or ACE inhibitor to protect her kidneys with her  diabetes. I would also think that starting her on Coreg would be  reasonable in regards to her blood pressure and palpitations with a  history of PVCs. I told her that she should have a stress test yearly  since she is a diabetic with multiple risk factors and we will schedule  this with adenosine some time in the next week or two.   REVIEW OF SYSTEMS:  Is otherwise remarkable for further workup of her  sleep apnea. Apparently, she needs to have her mask fitted and she will  followup with our pulmonary doctor for this.   CURRENT MEDICATIONS:  1. Glucophage 1 gram b.i.d.  2. Vitamins.  3. Hydrochlorothiazide 25 a day.  4. Aspirin a day.  5. Synthroid 100 mcg a day.  6. Norvasc 10 a day.  7. Januvia 100 a day.  8. Glipizide 10 a  day.  9. Co-enzyme Q.  10.Omega-3.  11.Multivitamins.   PHYSICAL EXAMINATION:  Is remarkable for a weight of 283, blood pressure  140/80, pulse 68 with an occasional PVC. Respiratory rate is 15. She is  afebrile.  HEENT: Is normal.  Thyroid is normal without nodules. There is no JVP elevation. No  lymphadenopathy. No bruits.  NECK: Supple.  LUNGS:  Are clear with good diaphragmatic motion. No wheezing.  There is an S1, S2 with normal heart sounds. PMI is normal.  ABDOMEN: Is protuberant. Bowel sounds are positive. No tenderness. No  AAA. No hepatosplenomegaly or hepatojugular reflux.  Distal pulses are intact with no edema.  NEURO: Nonfocal. There is no muscular weakness.   Her EKG shows sinus rhythm, occasional PVCs.   IMPRESSION:  1. Previous atypical chest pain, multiple coronary risk factors.      Followup adenosine Myoview in the next week or two.  2. Hypertension. Ideally, would be on an ARB or an  ACE inhibitor      and/or beta-blocker. She will followup with Dr.  Leveda Anna. I do not      think Norvasc is an ideal drug for her.  3. Diabetes. Continue weight loss and dietary control. Followup      hemoglobin A1c on a quarterly basis. Continue current medications.      Avoid Actos given propensity for fluid retention.  4. Palpitations with benign PVCs. Normal left ventricular function by      echo and previously non-ischemic Myoview. No indication for any      other therapy other than possible beta-blocker.   I will see the patient back in a year.     [name]. Eden Emms, MD, [facility]  Electronically Signed    PCN/MedQ  DD: 09/19/2006  DT: 09/19/2006  Job #: [id number]

## 2010-07-11 NOTE — Assessment & Plan Note (Signed)
[facility]                            CARDIOLOGY OFFICE NOTE   TAREN, DYMEK              MRN:          [id number]  DATE:11/21/2006                            DOB:          1940-09-19    [name] returns today for followup.  She has had significant  hypertension with noncardiac type chest pain, nonischemic Myoview.  She  has had premature ventricular contractions, lower extremity edema and  hypertension.  The last time I saw her, I tried to take her off her  Norvasc and switch her to a beta blocker since she was hypertensive and  had premature ventricular contractions.  She does not think this has  helped her blood pressure as much.  She talked to Dr. Leveda Anna and he,  apparently, does not want her on any angiotensin-converting enzyme  inhibitor or angiotensin receptor blockers.  There is no clear history  of angioedema as far as I can tell.   She is a diabetic and would benefit from this medicine.   She does try to avoid salt; however, she is not very good in regards to  her diabetes.  Her sugars have been running close to 175 to 200.  I  suspect she will need Lantus insulin at night.   She continues to have palpitations, and occasionally feels rapid  flutters for 2-3 minutes at a time.  She does not have significant  coronary disease by Myoview and has good left ventricular function.  I  think all that we need to do is continue her beta blocker.  She will  call us to get [name] the exact dose.  She knows she can ask for a CardioNet  monitor in the future if her symptoms worsen.   CURRENT MEDICATIONS:  1. Hydrochlorothiazide 25 a day.  2. Glucophage 1 g b.i.d.  3. Synthroid 100 mcg a day.  4. An aspirin a day.  5. Januvia, dose unknown.  6. Glipizide 10 mg a day.  7. Metoprolol, dose unknown.  8. Lipitor 20 mg a day.   PHYSICAL EXAMINATION:  An overweight black female in no distress.  Affect is appropriate.  Weight is 283,  blood pressure is 160/85, pulse is 83 and regular.  She  is afebrile.  Her respiratory rate is 14.  HEENT:  Normal.  Carotids normal without bruits.  There is no lymphadenopathy, no  thyromegaly, no jugular venous pressure elevation.  LUNGS:  Clear, good diaphragmatic motion, no wheezing.  There is an S1, S2 with normal heart sounds.  ABDOMEN:  Benign.  Bowel sounds are positive.  No hepatosplenomegaly, no  hepatojugular reflux, no tenderness.  Femorals are +3 bilaterally without bruits.  PTs are +1.  There is mild  lower extremity edema.  NEURO:  Nonfocal.  There is no muscular weakness.  SKIN:  Warm and dry.   IMPRESSION:  1. Hypertension:  Currently suboptimally controlled.  Add Norvasc 10      mg a day.  She will call us with her beta blocker dose.  She could      probably tolerate a little bit more of this since her resting heart  rate is 80.  Continue low-salt diet.  2. Poor control of her diabetes:  Follow with Dr. Leveda Anna.  I would      probably think that she would need 10 to 20 units of Lantus insulin      to supplement her Glucophage and glipizide.  She knows to avoid      reinstitution of Actos given her edema.  3. Mild lower extremity edema:  Improved.  Continue      hydrochlorothiazide 25 a day.  4. Palpitations, benign premature ventricular contractions:  No      evidence of coronary disease, good left ventricular function.      Followup event monitor if needed.  Continue beta blocker.   I will see her back in 6 months, and we will follow up her blood  pressure.  Her EKG today was essentially normal with poor R-wave  progression and insignificant Q waves in II, III, and F.     [name]. Eden Emms, MD, [facility]  Electronically Signed    PCN/MedQ  DD: 11/21/2006  DT: 11/22/2006  Job #: [id number]   cc:   William A. Leveda Anna, M.D.

## 2010-07-11 NOTE — Assessment & Plan Note (Signed)
[facility]                            CARDIOLOGY OFFICE NOTE   PAULLA, MCCLASKEY              MRN:          [id number]  DATE:06/30/2007                            DOB:          04-22-1940    Courtney Pena returns today for followup.  She has significant  hypertension.  She has had chest pain in the past.  She continues to get  pain under her breast.  I think it has more to do with gas.  She had a  Myoview in August of 2008 which was normal.  She was concerned that one  of her doctors told her she had cardiomegaly.  I explained to her that  we had a pretty good look on Myoview of her heart and that she may have  increased septal thickness but no significant cardiac enlargement.  We  will do a 2-D echocardiogram to confirm this.   She has been compliant with her meds.  She wanted to water aerobics, and  I told her this would be fine.  Her blood pressure is still suboptimally  controlled.  Unfortunately, she is allergic to ACE and ARB.  She says  Dr. Leveda Anna told her never to take these.  She seems to have had a rash  mostly and not true angioedema.   REVIEW OF SYSTEMS:  Remarkable for lower extremity edema that worsens  during the end of the day.  She has this uncomfortable feeling under her  breast, which I do not think is angina.  She has exertional dyspnea  which is chronic due to her weight.   CURRENT MEDS:  1. Include amlodipine 10 mg a day.  2. Metformin 1 gram a day.  3. Crestor 10 a day.  4. Hydrochlorothiazide 25 a day.  5. Aspirin a day.  6. Synthroid 100 mcg a day.  7. Januvia.  8. Glipizide 10 mg day.  9. Multivitamins.  10.Metoprolol 25 a day.   EXAM:  Remarkable for an overweight black female in no distress.  Affect  is appropriate.  Weight is 279, blood pressure is 172/100, pulse 92 and regular,  afebrile.  HEENT:  Unremarkable.  Carotids are without bruit, no lymphadenopathy, thyromegaly, JVP  elevation.  LUNGS:   Clear with good diaphragmatic motion.  No wheezing.  S1-S2 with  an S4 gallop. PMI normal.  ABDOMEN:  Benign.  Bowel sounds positive.  No AAA.  No bruit, no  hepatosplenomegaly or hepatojugular reflux.  Distal pulses intact, no edema.  NEURO:  Nonfocal.  SKIN:  Warm and dry.  No muscular weakness.   IMPRESSION:  1. Hypertension.  Increase Toprol 250 a day, hydrochlorothiazide of 50      a day and add BiDil.  Follow up in 8 weeks.  Low-salt diet.  2. Diabetes, poorly controlled.  Follow up with Dr. Leveda Anna.  She needs      to take her sugar a little bit more seriously.  Hemoglobin A1c      quarterly.  3. Hyperlipidemia.  Continue Crestor, lipid and liver profile in 6      months.  4. Lower extremity edema.  Continue low-salt diet.  Increase      hydrochlorothiazide to 50 a day.  Elevate legs at the end of the      day.  5. Question of cardiomegaly.  Check 2-D echocardiogram.  No      significant heart enlargement and normal LV function by Myoview.      Will check septal thickness at the time as well.  6. History of hypothyroidism.  Continue Synthroid 100 mcg a day.      Continue TSH, T4, check in 3 months.  7. Her EKG today showed sinus rhythm with an occasional PVC.  Her      heart rate was up a little bit at 86 with poor R wave progression.      Her beta blocker is being increased.   I will see her back in about 8 weeks to reassess her blood pressure.     [name]. Eden Emms, MD, [facility]  Electronically Signed    PCN/MedQ  DD: 06/30/2007  DT: 06/30/2007  Job #: [id number]

## 2010-07-11 NOTE — Procedures (Signed)
NAME:  Courtney Pena, Courtney Pena NO.:  [id number]   MEDICAL RECORD NO.:  [id number]          PATIENT TYPE:  OUT   LOCATION:  SLEEP CENTER                 FACILITY:  [facility]   PHYSICIAN:  Clinton D. Maple Hudson, MD, FCCP, FACPDATE OF BIRTH:  05/17/40   DATE OF STUDY:  07/09/2006                            NOCTURNAL POLYSOMNOGRAM   REFERRING PHYSICIAN:  Zola Button T. Lazarus Salines, M.D.   INDICATION FOR STUDY:  Hypersomnia with sleep apnea.  Epworth sleepiness  14/24, BMI 43.8, weight 280 pounds.   HOME MEDICATIONS:  Listed and reviewed.   A diagnostic NPSG on December 16, 2000 recorded an AHI of 73 per hour.  Reportedly, the patient never got established with CPAP.   SLEEP ARCHITECTURE:  Total sleep time 162 minutes with sleep efficiency  39%.  Stage I was 18%, stage II 82%, stages III, IV and REM absent.  Sleep latency 14 minutes.  Awake after sleep onset 240 minutes including  most time after 1 a.m.  Arousal index of 42.1.  Calcium and magnesium  were taken at 9:15 p.m.   RESPIRATORY DATA:  Split study protocol.  Apnea-hypopnea index (AHI,  RDI) 23.6 obstructive events per hour, indicating moderate obstructive  sleep apnea/hypopnea syndrome before CPAP.  There were 3 obstructive  apneas and 45 hypopneas before CPAP.  She slept mostly on her left side,  and most events were recorded in that position.  REM AHI NA.  CPAP was  titrated to a best pressure of 8 CWP, AHI of 3.4 per hour.  She was  described as restless with difficulty maintaining sleep after CPAP  applied.  She had what was thought to be a panic attack at the start of  CPAP administration, complaining of shortness of breath, nasal  congestion and feeling of claustrophobia.  A small Quattro full face  mask was used initially, subsequently switched to a large Liberty mask  with medium pillows.   OXYGEN DATA:  Moderate to loud snoring with oxygen desaturation to a  nadir of 84%.  Mean oxygen saturation through the study was  91% on room  air after CPAP.   CARDIAC DATA:  Sinus rhythm with occasional PVC.   MOVEMENT/PARASOMNIA:  Rare limb jerk with little effect on sleep.  Bathroom x2.   IMPRESSION/RECOMMENDATION:  1. Moderate obstructive sleep apnea/hypopnea syndrome, apnea-hypopnea      index 23.6 per hour with most events and most sleep while lying on      the left side.  Moderate to loud snoring with oxygen desaturation      to a nadir of 84%.  2. CPAP was titrated to 8 CWP during limited opportunity because of      what was described as panic claustrophobia and nasal congestion.      The patient was unable to maintain sleep without a sleep aid for      this titration.  Final pressure was 8 CWP, apnea-hypopnea index 3.4      per hour, and final mask used was a large Avaya with      medium pillows and heated humidifier.  3. Consider continuing efforts at CPAP adjustment as an outpatient.      Sleep center staff can work with  her in the daytime if desired to      desensitize and reevaluate for CPAP pressure.  Contact the sleep      center to discuss this if appropriate.  Home auto titration and use      of a sleep aid during CPAP adjustment may also be considerations if      appropriate.      Clinton D. Maple Hudson, MD, [facility], FACP  Diplomate, Biomedical engineer of Sleep Medicine  Electronically Signed     CDY/MEDQ  D:  07/14/2006 10:18:02  T:  07/14/2006 21:44:19  Job:  [id number]

## 2010-07-11 NOTE — Assessment & Plan Note (Signed)
[facility]ALTHC[facility]                            CARDIOLOGY OFFICE NOTE   Courtney Pena, Courtney Pena              MRN:          [id number]  DATE:05/05/2008                            DOB:          03/06/1940    Courtney Pena returns today for followup.  She has atypical chest pain with a  normal cath and a negative Myoview.  She has had palpitations which were  benign.  She has hypertension, diabetes, and hypercholesterolemia.  I  reviewed some blood work that she got at a fair, in particular she  wanted to know about her C-reactive protein.  Her value was 1.25 with  greater than 3 being high risk.  I told her this was fine.  Unfortunately, she has not been taking her cholesterol medicine, Lipitor  and Crestor both seem to give her myalgias.  I told her to try to take  them Monday, Wednesday, and Friday and see if this helps.  Dr. Leveda Anna  has checked her cholesterol recently and she said it was not in a bad  range.  We will try to get these results.   Otherwise, she has been doing well.  She does get occasional atypical  chest pains.  It actually sounds more like gas under her left breast.  She was concerned about occasional palpitations which sound benign.  She  also noticed her heart more prominently laying on her left side and I  assured her this was normal.   Her diabetes currently has been under good control.  I congratulated  Courtney Pena on her weight loss.  She was 282 in July 2009, 259 in September  2009, and now was 246.   Otherwise, she is doing well.  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   ALLERGIES:  She is allergic to SULFA, PENICILLIN, ACEs, and ARBs.   CURRENT MEDICATIONS:  1. Metformin 2 g a day.  2. Hydrochlorothiazide 25 a day.  3. An aspirin a day.  4. Synthroid 100 mcg a day.  5. Januvia, dose not listed.  6. Glipizide 10 a day.  7. Coenzyme Q.  8. Multivitamins.  9. Metoprolol 25 a day.  10.Lipitor 20 a day.  11.An aspirin a day.   PHYSICAL EXAMINATION:  GENERAL:  Remarkable for an overweight black  female in no distress.  VITAL SIGNS:  Weight was as indicated, blood pressure is 150/80, pulse  70 and regular, respiratory rate 40, afebrile.  HEENT:  Unremarkable.  NECK:  Carotids are normal without bruit.  No lymphadenopathy,  thyromegaly, or JVP elevation.  LUNGS:  Clear.  Good diaphragmatic motion.  No wheezing.  CARDIAC:  S1, S2.  Normal heart sounds.  PMI normal.  ABDOMEN:  Benign.  Bowel sounds positive.  No AAA, no tenderness, no  bruit, no hepatosplenomegaly, no hepatojugular reflux, or tenderness.  EXTREMITIES:  Distal pulses are intact.  No edema.  NEURO:  Nonfocal.  SKIN:  Warm and dry.  MUSCULOSKELETAL:  No muscular weakness.   IMPRESSION:  1. Hypertension.  Amlodipine stopped by Dr. Leveda Anna.  Blood pressure      readings at home seem to be  fine.  Continue low-sodium diet.      Consider reinstituting calcium-blocker if her home blood pressure      readings are up.  2. Diabetes.  Continue oral hypoglycemics.  Hemoglobin A1c quarterly.  3. Palpitations benign.  Continue low-dose beta-blocker.  4. Previous history of atypical chest pain, nonrecurrent.  No need for      followup stress test.  5. Hypercholesterolemia.  We will check labs from Dr. Leveda Anna.  She      will try to take her Lipitor Monday, Wednesday, and Friday.      Continue coenzyme Q.  6. Hypothyroidism.  Continue Synthroid replacement.  TSH and T4 in 6      months.  Overall, I think Courtney Pena is doing fine.  I will see her      back in 6 months.     [name]. Eden Emms, MD, [facility]  Electronically Signed    PCN/MedQ  DD: 05/05/2008  DT: 05/06/2008  Job #: [id number]

## 2010-07-12 ENCOUNTER — Encounter: Payer: Self-pay | Admitting: Family Medicine

## 2010-07-12 ENCOUNTER — Ambulatory Visit (INDEPENDENT_AMBULATORY_CARE_PROVIDER_SITE_OTHER): Payer: 59 | Admitting: Family Medicine

## 2010-07-12 VITALS — BP 166/89 | HR 61 | Temp 97.7°F | Ht 65.0 in | Wt 276.0 lb

## 2010-07-12 DIAGNOSIS — N390 Urinary tract infection, site not specified: Secondary | ICD-10-CM

## 2010-07-12 DIAGNOSIS — R350 Frequency of micturition: Secondary | ICD-10-CM

## 2010-07-12 DIAGNOSIS — M1990 Unspecified osteoarthritis, unspecified site: Secondary | ICD-10-CM

## 2010-07-12 LAB — POCT URINALYSIS DIPSTICK
Bilirubin, UA: NEGATIVE
Blood, UA: NEGATIVE
Glucose, UA: NEGATIVE
Nitrite, UA: NEGATIVE
Protein, UA: NEGATIVE
Spec Grav, UA: 1.025
Urobilinogen, UA: 0.2
pH, UA: 5.5

## 2010-07-12 LAB — POCT UA - MICROSCOPIC ONLY

## 2010-07-12 MED ORDER — POLYSACCHARIDE IRON COMPLEX 150 MG PO CAPS: 150.0000 mg | ORAL_CAPSULE | Freq: Two times a day (BID) | ORAL | Status: DC

## 2010-07-12 MED ORDER — DICLOFENAC SODIUM ER 100 MG PO TB24: 100.0000 mg | ORAL_TABLET | Freq: Every day | ORAL | Status: DC

## 2010-07-12 NOTE — Progress Notes (Signed)
  Subjective:    Patient ID: Courtney Pena, female    DOB: 1940/12/31, 70 y.o.   MRN: [id number]  HPI call urine results on cell.  If neg, get pelvic ultrasound Treated for UTI, still has frequency.  "Feels like something is sitting on top of my bladder."  Feeling is primarily Rt. Sided.  Recent colonoscopy.  Had pelvic exam by gyn ~6 months ago and normal.   Review of Systems Denies change in bowels     Objective:   Physical Exam Abd benign       Assessment & Plan:

## 2010-07-13 ENCOUNTER — Telehealth: Payer: Self-pay | Admitting: Family Medicine

## 2010-07-13 DIAGNOSIS — R350 Frequency of micturition: Secondary | ICD-10-CM | POA: Insufficient documentation

## 2010-07-13 NOTE — Telephone Encounter (Signed)
Called and informed pt to call me so that we could set up her appt for [name].

## 2010-07-14 NOTE — Op Note (Signed)
NAME:  Courtney Pena, OSTERMILLER               ACCOUNT NO.:  [id number]   MEDICAL RECORD NO.:  [id number]                   PATIENT TYPE:  AMB   LOCATION:  ENDO                                 FACILITY:  [facility]   PHYSICIAN:  Anselmo Rod, M.D.               DATE OF BIRTH:  10/24/1940   DATE OF PROCEDURE:  10/25/2003  DATE OF DISCHARGE:                                 OPERATIVE REPORT   PROCEDURE:  Esophagogastroduodenoscopy with biopsy.   ENDOSCOPIST:  Charna Elizabeth, M.D.   INSTRUMENT USED:  Olympus video panendoscope.   INDICATIONS FOR PROCEDURE:  70 year old African American female with a  history of epigastric pain.  Rule out peptic ulcer disease, esophagitis,  gastritis, etc.  The patient has also had some blood in her stool.   PREPROCEDURE PREPARATION:  Informed consent was obtained from the patient.  The patient was fasted for eight hours prior to the procedure.   PREPROCEDURE PHYSICAL:  Patient with stable vital signs.  Neck supple.  Chest clear to auscultation.  S1 and S2 regular.  Abdomen soft with normal  bowel sounds.   DESCRIPTION OF PROCEDURE:  The patient was placed in the left lateral  decubitus position, sedated with 50 mg of Demerol and 5 mg Versed in slow  incremental doses.  Once the patient was adequately sedated, maintained on  low flow oxygen and continuous cardiac monitoring, the Olympus video  panendoscope was advanced through the mouth piece over the tongue into the  esophagus under direct vision.  The entire esophagus appeared normal with no  evidence of ring, strictures, masses, esophagitis, or Barrett's mucosa.  The  scope was then advanced into the stomach.  A small hiatal hernia was seen on  retroflexion.  There were a few antral erosions present.  These are not  enough to explain the patient's blood in stool or severe abdominal pain.  Biopsies were done to rule out the presence of H. pylori by pathology.  A  small polyp was biopsied from the  proximal small bowel, as well.  No ulcers  or masses were seen.  There was a questionable indentation of the antrum  from possible intrinsic compression of the stomach.   IMPRESSION:  1. Normal appearing esophagus.  2. Small hiatal hernia.  3. Antral erosions biopsied for H. pylori.  4. Small sessile polyp biopsied from the small bowel.  5. There was a questionable indentation of the antrum from possible     intrinsic compression of the stomach.   RECOMMENDATIONS:  1. Await pathology results.  2. Avoid nonsteroidals for now.  3. Continue PPIs twice a day.  4. CT scan of the abdomen and pelvis to be scheduled, questionable intrinsic     compression of the antral area.  5. Outpatient follow up in the next two weeks for further recommendations.  Anselmo Rod, M.D.    JNM/MEDQ  D:  10/25/2003  T:  10/25/2003  Job:  [id number]   cc:   William A. Leveda Anna, M.D.  Fax: [phone]

## 2010-07-14 NOTE — Assessment & Plan Note (Signed)
[facility]                            CARDIOLOGY OFFICE NOTE   NANCY, ARVIN            MRN:          [id number]  DATE:02/27/2006                            DOB:          August 03, 1940    [name] returns today for followup.  She is a diabetic who  has had some shortness of breath, palpitations, and chest pain.  Her  Myoview was low-risk with a normal EF.  She seems to think her shortness  of breath is improved.  Her palpitations appear benign and she has had  some PVCs by monitoring.   She has had normal heart catheterization back in May of 2004.  She  continues to need better control of her diabetes and weight.   I talked to her at length today regarding the fact that I thought her  current symptoms were stable and she did not need further intervention.  However, I am somewhat curious to have Dr. Leveda Anna reevaluate the need  for an ARB.  Apparently, there was some issue with Altace in the past  that she could not tolerate.  However, I think she ought to be on either  Mavik, Cozaar, or Diovan for renal protection and decreased proteinuria.   Her blood pressure can certainly tolerate it.   REVIEW OF SYSTEMS:  Remarkable for improved shortness of breath.  No  coughing.  No PND or orthopnea.  Trace lower extremity edema, and no  significant chest pain.   MEDICATIONS:  1. Glucophage 1 g b.i.d.  2. An aspirin a day.  3. Synthroid 100 mcg a day.  4. Lipitor 20 a day.  5. Norvasc 10 a day.  6. Januvia.  7. Glipizide 10 a day.   Blood pressure today is 130/80, pulse is 74 and regular.  HEENT:  Normal.  There is no thyromegaly or carotid bruits.  LUNGS:  Clear.  There is an S1, S2.  Distant heart sounds.  ABDOMEN:  Benign.  LOWER EXTREMITIES:  Intact pulses with trace edema with mild  varicosities bilaterally.   IMPRESSION:  1. Shortness of breath, likely due to obesity.  No evidence of      significant  cardiopulmonary disease.  Normal left ventricular      function.  Atypical chest pain in a diabetic with a low-risk      Myoview.  EKG normal.  2. Palpitations with occasional premature ventricular contractions.   At some point in the future, it may be worthwhile to stop the patient's  Norvasc and start low-dose beta blockade.  However, for the time being  she appears stable.  I would be more interested in stopping her Norvasc  to get her on Diovan, Mavik, or some other angiotensin-receptor blocker  for renal protection.  I will see her back in 6 months and Dr. Leveda Anna  will further address her diabetes and other adjuvant medicine for her  blood pressure.     [name]. Eden Emms, MD, [facility]  Electronically Signed    PCN/MedQ  DD: 02/27/2006  DT: 02/27/2006  Job #: [id number]

## 2010-07-14 NOTE — Telephone Encounter (Signed)
Spoke with pt and she is going to call back with available time and day to have US done .Loralee Pacas [place]

## 2010-07-14 NOTE — Op Note (Signed)
NAMEMarland Pena  JOELYS, STAUBS     ACCOUNT NO.:  [id number]   MEDICAL RECORD NO.:  [id number]          PATIENT TYPE:  AMB   LOCATION:  ENDO                         FACILITY:  [facility]   PHYSICIAN:  Anselmo Rod, M.D.  DATE OF BIRTH:  10/08/1940   DATE OF PROCEDURE:  11/15/2003  DATE OF DISCHARGE:  11/15/2003                                 OPERATIVE REPORT   PROCEDURE PERFORMED:  Screening colonoscopy.   ENDOSCOPIST:  Charna Elizabeth, M.D.   INSTRUMENT USED:  Olympus video colonoscope.   INDICATIONS FOR PROCEDURE:  The patient is a 70 year old African-American  female with a history of blood in stool.  Rule out colonic polyps, masses,  etc.   PREPROCEDURE PREPARATION:  Informed consent was procured from the patient.  The patient was fasted for eight hours prior to the procedure and prepped  with a bottle of magnesium citrate and a gallon of GoLYTELY the night prior  to the procedure.   PREPROCEDURE PHYSICAL:  The patient had stable vital signs.  Neck supple.  Chest clear to auscultation.  S1 and S2 regular.  Abdomen soft with normal  bowel sounds.   DESCRIPTION OF PROCEDURE:  The patient was placed in left lateral decubitus  position and sedated with 100 mg of Demerol and 10 mg of Versed in slow  incremental doses.  Once the patient was adequately sedated and maintained  on low flow oxygen and continuous cardiac monitoring, the Olympus video  colonoscope was advanced from the rectum to the cecum.  Except for internal  and external hemorrhoids, no other abnormalities were noted.  No masses,  polyps, erosions, ulcerations or diverticula were seen.  The procedure was  complete up to the cecum.  The appendicular orifice and ileocecal valve were  clearly visualized and photographed.  The patient tolerated the procedure  well without complication.   IMPRESSION:  Unrevealing colonoscopy up to the cecum except or small  internal and external hemorrhoids.   RECOMMENDATIONS:  1.   Continue high fiber diet with liberal fluid intake.  2.  Repeat colonoscopy is recommended in the next five years unless the      patient develops any abnormal symptoms in the interim.  3.  Outpatient followup in the next two weeks or earlier if need be.       JNM/MEDQ  D:  11/17/2003  T:  11/18/2003  Job:  [id number]   cc:   William A. Leveda Anna, M.D.  Fax: [phone]

## 2010-07-14 NOTE — H&P (Signed)
[place]. [facility]  Patient:    Courtney Pena, Courtney Pena              MRN: [id number] Adm. Date:  09/07/99 Attending:  Santiago Bumpers. Leveda Anna, M.D. Dictator:   Patricia Pesa, M.D.                         History and Physical  CHIEF COMPLAINT:  Fever.  HISTORY OF PRESENT ILLNESS:  The patient is a 70 year old with type 2 diabetes who presents with 3 days history of fever to 104.0, myalgias, decreased p.o. intake, diffuse headache, conjunctivitis.  She denies any sick contacts, no tick exposure, no recent travel.  She has not had any similar symptoms previously.  PAST MEDICAL HISTORY: 1. Type 2 diabetes mellitus. 2. Hyperthyroidism. 3. Hypercholesterolemia. 4. Obesity. 5. Tension headache. 6. Carpal tunnel syndrome. 7. Hypertension. 8. Gastroesophageal reflux disease. 9. Degenerative joint disease.  MEDICATIONS: 1. Actos 15 mg q. day. 2. Altace 5 mg q. day. 3. Aspirin 325 mg q. day. 4. Celebrex 200 mg q. day. 5. Glucophage 500 mg b.i.d. 6. Lipitor 20 mg q. day. 7. Synthroid 75 mcg q. day.  SOCIAL HISTORY: The patient is nonsmoker, nondrinker.  FAMILY HISTORY: Significant for coronary artery disease, diabetes, hemangioma.  REVIEW OF SYSTEMS:  The patient has increased fatigue. She has had heart palpitations.  No shortness of breath.  She has mild diffuse abdominal pain. No skin rash, no neck stiffness.  PHYSICAL EXAMINATION:  VITAL SIGNS:  Temperature 98.1, blood pressure 110/60, pulse 74, SaO2 is 89% on room air.  GENERAL:  The patient is a ill-appearing female in no respiratory distress.  HEENT:  There is a clear discharge from her eyes bilaterally.  Conjunctivae is injected.  Nose: No discharge.  TMs:  Wallace Cullens and nonbulging bilaterally. Pharynx:  Mildly erythematous.  Mucous membranes are tacky.  NECK:  Supple, no lymphadenopathy.  No thyromegaly.  LUNGS:  Clear to auscultation.  CARDIOVASCULAR:  Distant heart sounds.  GI:  Mild  diffuse abdominal tenderness, no rebound.  No masses.  Bowel sounds are normal.  SKIN:  No rashes noted.  NEUROLOGIC:  No meningeal signs.  LABORATORY DATA:  September 06, 1999, WBC 3.9, hemoglobin 13.2, platelets 100, sodium 133, CMP otherwise remarkable for alk. phos. 164, AST 99, ALT 92. Chest x-ray shows cardiomegaly with no active disease.  ASSESSMENT AND PLAN:  A 70 year old patient with fever, myalgias, conjunctivitis and low platelets.  #1 - FEVER - Consider [facility] spotted fever with patients severe myalgias, headaches and low platelets.  Will also consider viral syndrome such as adenovirus or meningitis.  Will admit to the hospital. Will check CBC, blood and urine cultures.  Will treat for [facility] spotted fever with doxycycline.  Consider LP if warranted.  #2 - DIABETES:  Will gently hydrate.  Will check labs for signs of hyperosmotic nonketotic syndrome.  Continue home medications except for glucophage.  Add sliding-scale insulin if needed.  #3 - HYPOTHYROIDISM:  Will check TSH.  #4 - INCREASED LFTS:  Will follow. Consider actos as cause of increased LFTs.  #5 - HYPOXIA:  Will use 02 p.r.n. and consider further work-up. DD:  09/07/99 TD:  09/07/99 Job: [id number]

## 2010-07-14 NOTE — Discharge Summary (Signed)
[place]. [facility]  Patient:    Courtney Pena, Courtney Pena            MRN: [id number] Adm. Date:  [id number] Disc. Date: [id number] Attending:  Sanjuana Letters Dictator:   Maryelizabeth Rowan, M.D. CC:         Santiago Bumpers. Leveda Anna, M.D., [facility]                           Discharge Summary  DATE OF BIRTH:  1940/06/25.  PRIMARY DIAGNOSIS:  [facility] Spotted Fever.  SECONDARY DIAGNOSES: 1. Diabetes mellitus. 2. Conjunctivitis.  HISTORY OF PRESENT ILLNESS:  This 70 year old presented to ER complaining of a three day history of fever up to 104 degrees at home with some myalgia, arthralgia, headache, and red itchy eyes.  The patient was admitted to the hospital with fever considered due to [facility] Spotted Fever and therefore started on Doxycycline.  She was also treated with Tobradex for her conjunctivitis and her diabetes and hypothyroidism were monitored closely.  HOSPITAL COURSE:  The patient received Doxycycline IV for her [facility] Spotted Fever.  The patients condition improved on the antibiotics and her hypoxia upon admission responded well to oxygen per nasal cannula.  PROCEDURES:  Chest x-ray showed some pulmonary vascular congestion and cardiomegaly.  An echocardiogram showed normal function.  A head CT was performed to rule out brain tumor and scan was normal.  CONSULTANTS:  Fransisco Hertz, M.D., of infectious disease.  LABS:  Throughout hospital course the patient had a leukopenia and also some elevated SGOT and SGPT which decreased back to the normal range by the date of discharge.  The patient was also tested for HIV which was negative and also sent were double stranded DNA test and __________ antibody to check for lupus.  These tests were not available by the time of discharge.  DISCHARGE CONDITION:  The patient was discharged to her home in a stable condition.  DISCHARGE INSTRUCTIONS:   Follow-up with Chrissie Noa A. Leveda Anna, M.D., at the [facility] on October 02, 1999.  As an outpatient at that visit we will repeat titers to [facility] Fever ____________ and leptospirosis which were negative while in the hospital.  ACTIVITY:  The patient was instructed not to return to work until after her appointment with her primary physician.  DIET: The patient was instructed to follow a diabetic diet.  MEDICATIONS: 1. Doxycycline 100 mg twice a day x 2 days. 2. Tobradex one drop in both eyes four times a day. 3. Actos 15 mg q.a.m. 4. Altace 5 mg q.d. 5. Glucophage 500 mg q.d. 6. Lipitor 20 mg q.d. 7. Synthroid 75 mg q.d. 8. Protonix 40 mg q.d. DD:  09/21/99 TD:  09/22/99 Job: [id number]

## 2010-07-14 NOTE — Cardiovascular Report (Signed)
NAME:  CHARLETTA, VOIGHT               ACCOUNT NO.:  [id number]   MEDICAL RECORD NO.:  [id number]                   PATIENT TYPE:  OIB   LOCATION:  2007                                 FACILITY:  [facility]   PHYSICIAN:  Veneda Melter, M.D.                   DATE OF BIRTH:  04-Feb-1941   DATE OF PROCEDURE:  07/09/2002  DATE OF DISCHARGE:                              CARDIAC CATHETERIZATION   PROCEDURES PERFORMED:  1. Left heart catheterization.  2. Left ventriculogram.  3. Selective coronary angiography.  4. Abdominal aortogram.   DIAGNOSES:  1. Mild coronary atherosclerotic disease by angiogram.  2. Normal left ventricular systolic function.   HISTORY:  Ms. Hegner is a 70 year old black female with a history  of hypertension, diabetes mellitus and chest discomfort who presents today  for followup.  The patient has previously undergone cardiac catheterization  in 1999 showing only mild disease.  She has had abnormal Cardiolite study in  2002 showing mild apical defect.  She has done well for the past several  years.  However, recently she has developed recurrence of substernal chest  discomfort.  Due to the development of diabetes and the abnormal Cardiolite,  she is referred for further assessment.   TECHNIQUE:  Informed consent was obtained.  The patient brought to the  catheterization lab.  A 6 French sheath was placed in the right femoral  artery using the modified Seldinger technique.  A 6 [place] JL-4 and JR-4  catheter was then used to engage the left and right coronary arteries and  selective angiography performed in various projections using manual  injection contrast.  A 6 French pigtail catheter was advanced in the left  ventricle and a left ventriculogram performed using power injection  contrast.  After termination of this case, the catheters and sheaths were  removed and manual pressure applied until adequate hemostasis was achieved.  The patient  tolerated the procedure well and was transferred to floor in  stable condition.   FINDINGS:   LEFT HEART CATHETERIZATION:  1. Left main trunk:  Medium-caliber vessel.  Angiographically  normal.  2. LAD:  This is a large-caliber vessel that provides two diagonal branches.     The LAD has luminal disease.  Concentric narrowing to 60% in the proximal     segment involving bifurcation of the first diagonal branch.  There is     then further tubular narrowing of 40% in the mid section prior to take-     off of second diagonal branch.  The distal LAD has mild irregularities.  3. Left circumflex artery.  This is a medium-caliber vessel that provides to     marginal branches.  The left circumflex artery has an ostial narrowing of     30%.  There are then mild irregularities in the distal section.  4. Right coronary artery is dominant.  This is a large-caliber vessels that     provides to the posterior descending  artery and to posterior ventricular     branches, internal segment.  The right coronary has luminal     irregularities.   LEFT VENTRICULOGRAPHY:  1. LV normal end-systolic and end-diastolic dimensions.  2. Overall left ventricular function is well preserved.  3. Ejection fraction greater than 55%.  4. No mitral regurgitation.  5. LV pressure is 170/20.  6. Aortic pressure is 170/100.  7. LVEDP equals 25.   ABDOMINAL AORTOGRAPHY:  1. Abdominal  aorta is of normal caliber without critical stenosis or     dilatation.  2. Renal arteries are single and widely patent bilaterally.  3. The iliac arteries have no significant disease.   ASSESSMENT AND PLAN:  Ms.  Ocon is a 70 year old who presents  with substernal chest discomfort.  She has trivial coronary artery disease  and _______________.  Continued medical therapy will be pursued.  Aggressive  attention to her hypertension will be recommended and the other causes of  chest pain investigated.                                                  Veneda Melter, M.D.    NG/MEDQ  D:  07/09/2002  T:  07/10/2002  Job:  [id number]   cc:   Rollene Rotunda, M.D.   William A. Hensel, M.D.  [address] [place]  [place] [address]  Fax: [phone]

## 2010-07-14 NOTE — Discharge Summary (Signed)
NAME:  Courtney Pena, Courtney Pena               ACCOUNT NO.:  [id number]   MEDICAL RECORD NO.:  [id number]                   PATIENT TYPE:  OIB   LOCATION:  2007                                 FACILITY:  [facility]   PHYSICIAN:  Veneda Melter, M.D.                   DATE OF BIRTH:  08/09/1940   DATE OF ADMISSION:  07/09/2002  DATE OF DISCHARGE:                                 DISCHARGE SUMMARY   DISCHARGE DIAGNOSES:  1. Minimal, non-critical coronary artery disease.     a. Cardiac catheterization this admission by Dr. Veneda Melter: Left main        normal, LAD to diagonals, mild disease, left circumflex with 2 OMs        with ostial 30% stenosis, RCA dominant with luminal disease. LV        ejection fraction greater than 55%. No mitral regurgitation.     b. Post catheterization right groin hematoma - ultrasound negative for        pseudoaneurysm.  2. Diabetes mellitus, type 2.  3. Hypertension.  4. Untreated, hyperlipoproteinemia.  5. Hypothyroidism.   PROCEDURES PERFORMED THIS ADMISSION:  Cardiac catheterization by Dr. Veneda Melter on Jul 09, 2002.   HOSPITAL COURSE:  Please see the office note from June 03, 2002, for  complete details. Briefly, this 70 year old female was brought in on an  elective basis on Jul 09, 2002, to evaluate recurrent chest pain. Cardiac  catheterization was performed by Dr. Veneda Melter. The results are noted  above. The patient tolerated the procedure well. Post catheterization, the  patient began to complain of groin pain. A moderate sized hematoma was  noted. The patient preferred to stay overnight in the hospital. She was  admitted for observation. On the morning of Jul 10, 2002, she was in stable  condition. Her hemoglobin was stable at 11.2. This was unchanged from her  previous pre-admission reading. An ultrasound of her groin was performed on  Jul 10, 2002. This was negative for pseudoaneurysm. Therefore, the patient  was discharged to home in stable  condition. She will have a 2-week followup  with the P.A., per Dr. Chales Abrahams, to recheck her groin.   LABS:  White count 6500, hemoglobin 11.2, hematocrit 33.5, platelet count  200,000. Admission hemoglobin 11.5 and hematocrit 35. INR 0.9. Sodium 139,  potassium 3.7, chloride 104, CO2 29, glucose 185, BUN 12, creatinine 0.5,  calcium 9.4.   CHEST X-RAY:  No evidence of acute cardiopulmonary disease.   DISCHARGE MEDICATIONS:  1. Darvocet N-100 1 to 2 q.6-8h p.r.n. pain.  2. Glucophage 1000 mg twice daily, to be restarted Sunday, May 16.  3. Synthroid 100 mcg daily.  4. HCTZ 25 mg daily.  5. Actos 30 mg daily.  6. Altace 10 mg daily.  7. Aspirin 81 mg daily.   PAIN MANAGEMENT:  Tylenol or Darvocet as needed for pain management.   ACTIVITY:  No driving, heavy lifting, exertional work, or  sex for 3 days.   DIET:  Low-salt, low-fat, diabetic diet.   WOUND CARE:  The patient should call our office for any groin swelling,  bleeding or bruising, or any increases in her symptoms.   Followup is with the physician assistant for Dr. Chales Abrahams on May 28  at 11:00  a.m.   Upon review of the records available at the time of her discharge, it is  unclear as to why she is not on statin therapy for her hyperlipidemia. This  can be reviewed at her followup appointment.     Tereso Newcomer, P.A.                        Veneda Melter, M.D.    SW/MEDQ  D:  07/10/2002  T:  07/10/2002  Job:  [id number]   cc:   Rollene Rotunda, M.D.   William A. Hensel, M.D.  [address] [place]  [place] [address]  Fax: [phone]

## 2010-07-14 NOTE — Op Note (Signed)
NAME:  Courtney Pena, Courtney Pena               ACCOUNT NO.:  [id number]   MEDICAL RECORD NO.:  [id number]                   PATIENT TYPE:  AMB   LOCATION:  DSC                                  FACILITY:  [facility]   PHYSICIAN:  Katy Fitch. Naaman Plummer., M.D.          DATE OF BIRTH:  August 12, 1940   DATE OF PROCEDURE:  12/02/2001  DATE OF DISCHARGE:                                 OPERATIVE REPORT   PREOPERATIVE DIAGNOSES:  Chronic entrapment neuropathy of median nerve, left  carpal tunnel.   POSTOPERATIVE DIAGNOSES:  Chronic entrapment neuropathy of median nerve,  left carpal tunnel.   OPERATION PERFORMED:  Release of left transverse carpal ligament.   SURGEON:  Katy Fitch. Sypher, M.D.   ASSISTANT:  Jonni Sanger, P.A.   ANESTHESIA:  General by LMA.   SUPERVISING ANESTHESIOLOGIST:  Janetta Hora. Gelene Mink, M.D.   INDICATIONS FOR PROCEDURE:  The patient is a 70 year old woman who has had a  history of bilateral hand pain and numbness.  She is status post right  carpal tunnel release with a very satisfactory result.  She now returns for  identical surgery on the left.  Following informed consent she is brought to  the operating room at this time anticipating release of the left transverse  carpal ligament.   DESCRIPTION OF PROCEDURE:  The patient was brought to the operating room and  placed in supine position upon the operating table.  Following induction of  general anesthesia by LMA, the left arm was prepped with Betadine soap and  solution and sterilely draped.  Following exsanguination of the limb with an  Esmarch bandage, an arterial tourniquet on the proximal brachium was  inflated to 220 mmHg.  The procedure commenced with a short incision in line  with the ring finger in the palm.  Subcutaneous tissues are carefully  divided revealing the palmar fascia.  This was split longitudinally to  reveal the common sensory branch of the median nerve.  This was followed  back to the  transverse carpal ligament which was carefully isolated from the  median nerve.  It was released on its ulnar border extending to the distal  forearm.  This widely opened the carpal canal.  Bleeding points along the  margin of the released ligament were electrocauterized with bipolar current  followed by repair of the skin with intradermal 3-0 Prolene suture.   A compressive dressing was applied with a volar plaster splint maintaining  the wrist in five degrees dorsiflexion.  For aftercare she was advised to  use Tylenol as her primary analgesic.  She also has a prescription for  Percocet 5 mg one or two p.o. q.4-6h. p.r.n. pain, 20 tablets without  refill.  She will return to the office in follow-up in seven to 10 days.  Katy Fitch Naaman Plummer., M.D.   RVS/MEDQ  D:  12/02/2001  T:  12/02/2001  Job:  [id number]

## 2010-07-17 ENCOUNTER — Other Ambulatory Visit: Payer: Self-pay | Admitting: Family Medicine

## 2010-07-17 DIAGNOSIS — R350 Frequency of micturition: Secondary | ICD-10-CM

## 2010-07-17 NOTE — Telephone Encounter (Signed)
Called pt to find out availability and she can do the [name] Friday 07/21/2010 between 11-12. Pt has appt for 5.25.12 @ 1030 at [facility] [place] [address] pt informed and agreed.Courtney Pena [place]

## 2010-07-21 ENCOUNTER — Ambulatory Visit
Admission: RE | Admit: 2010-07-21 | Discharge: 2010-07-21 | Disposition: A | Discharge status: Home / Self Care | Payer: 59 | Source: Ambulatory Visit | Attending: Family Medicine | Admitting: Family Medicine

## 2010-07-21 ENCOUNTER — Ambulatory Visit: Admission: RE | Admit: 2010-07-21 | Payer: 59 | Source: Ambulatory Visit

## 2010-07-21 DIAGNOSIS — R350 Frequency of micturition: Secondary | ICD-10-CM

## 2010-08-01 ENCOUNTER — Telehealth: Payer: Self-pay | Admitting: Gastroenterology

## 2010-08-01 ENCOUNTER — Ambulatory Visit: Payer: 59 | Admitting: Gastroenterology

## 2010-08-01 NOTE — Telephone Encounter (Signed)
Please bill for same day cx per Dr Jarold Motto

## 2010-11-01 ENCOUNTER — Encounter: Payer: 59 | Admitting: Family Medicine

## 2010-11-08 ENCOUNTER — Other Ambulatory Visit: Payer: Self-pay | Admitting: Family Medicine

## 2010-11-08 NOTE — Telephone Encounter (Signed)
Refill request

## 2010-11-10 ENCOUNTER — Encounter: Payer: 59 | Admitting: Family Medicine

## 2010-11-20 ENCOUNTER — Encounter: Payer: Self-pay | Admitting: Family Medicine

## 2010-11-20 DIAGNOSIS — H26499 Other secondary cataract, unspecified eye: Secondary | ICD-10-CM | POA: Insufficient documentation

## 2010-11-20 HISTORY — DX: Other secondary cataract, unspecified eye: H26.499

## 2010-11-20 NOTE — Progress Notes (Signed)
  Subjective:    Patient ID: Courtney Pena, female    DOB: 1941-02-16, 70 y.o.   MRN: [id number]  HPI No diabetic retinopathy by exam of today by Dr. Vela Prose.    Review of Systems     Objective:   Physical Exam        Assessment & Plan:

## 2010-11-22 ENCOUNTER — Ambulatory Visit (INDEPENDENT_AMBULATORY_CARE_PROVIDER_SITE_OTHER): Payer: 59 | Admitting: Family Medicine

## 2010-11-22 ENCOUNTER — Encounter: Payer: Self-pay | Admitting: Family Medicine

## 2010-11-22 VITALS — BP 167/94 | HR 72 | Temp 97.9°F | Ht 65.0 in | Wt 268.9 lb

## 2010-11-22 DIAGNOSIS — E1165 Type 2 diabetes mellitus with hyperglycemia: Secondary | ICD-10-CM

## 2010-11-22 DIAGNOSIS — I10 Essential (primary) hypertension: Secondary | ICD-10-CM

## 2010-11-22 DIAGNOSIS — E7800 Pure hypercholesterolemia, unspecified: Secondary | ICD-10-CM

## 2010-11-22 DIAGNOSIS — Z23 Encounter for immunization: Secondary | ICD-10-CM

## 2010-11-22 LAB — POCT GLYCOSYLATED HEMOGLOBIN (HGB A1C): Hemoglobin A1C: 8.5

## 2010-11-22 MED ORDER — BETAMETHASONE DIPROPIONATE AUG 0.05 % EX CREA: TOPICAL_CREAM | Freq: Two times a day (BID) | CUTANEOUS | Status: DC

## 2010-11-22 MED ORDER — OMEPRAZOLE 40 MG PO CPDR: 40.0000 mg | DELAYED_RELEASE_CAPSULE | Freq: Every day | ORAL | Status: DC

## 2010-11-22 MED ORDER — MECLIZINE HCL 25 MG PO TABS: 25.0000 mg | ORAL_TABLET | Freq: Four times a day (QID) | ORAL | Status: DC | PRN

## 2010-11-22 MED ORDER — GLUCOSE BLOOD VI STRP: ORAL_STRIP | Status: DC

## 2010-11-22 MED ORDER — AMLODIPINE BESYLATE 10 MG PO TABS: 10.0000 mg | ORAL_TABLET | Freq: Every day | ORAL | Status: DC

## 2010-11-22 NOTE — Patient Instructions (Addendum)
Stay on all same meds New med is amlodipine (Norvasc) for blood pressure I will call with cholesterol results.  You may need to go back on pravastatin. Keep working on wt loss.  Diabetes is better but not yet good. See me in 2 months for recheck Today you get flu shot and Tetanus (Tdap which also protects you from pertusis)

## 2010-11-23 ENCOUNTER — Encounter: Payer: Self-pay | Admitting: Family Medicine

## 2010-11-23 LAB — BASIC METABOLIC PANEL WITH GFR
BUN: 18 mg/dL (ref 6–23)
CO2: 24 mEq/L (ref 19–32)
Calcium: 10 mg/dL (ref 8.4–10.5)
Chloride: 102 mEq/L (ref 96–112)
Creat: 0.67 mg/dL (ref 0.50–1.10)
GFR, Est African American: >60 mL/min (ref >60)
GFR, Est Non African American: >60 mL/min (ref >60)
Glucose, Bld: 118 mg/dL — ABNORMAL HIGH (ref 70–99)
Potassium: 4 mEq/L (ref 3.5–5.3)
Sodium: 137 mEq/L (ref 135–145)

## 2010-11-23 LAB — CBC
HCT: 37.2 % (ref 36.0–46.0)
Hemoglobin: 11.5 g/dL — ABNORMAL LOW (ref 12.0–15.0)
MCH: 26.5 pg (ref 26.0–34.0)
MCHC: 30.9 g/dL (ref 30.0–36.0)
MCV: 85.7 fL (ref 78.0–100.0)
Platelets: 263 10*3/uL (ref 150–400)
RBC: 4.34 MIL/uL (ref 3.87–5.11)
RDW: 17.6 % — ABNORMAL HIGH (ref 11.5–15.5)
WBC: 7.5 10*3/uL (ref 4.0–10.5)

## 2010-11-23 LAB — LDL CHOLESTEROL, DIRECT: Direct LDL: 156 mg/dL — ABNORMAL HIGH

## 2010-11-23 MED ORDER — PRAVASTATIN SODIUM 20 MG PO TABS: 20.0000 mg | ORAL_TABLET | Freq: Every evening | ORAL | Status: DC

## 2010-11-23 NOTE — Progress Notes (Signed)
  Subjective:    Patient ID: Courtney Pena, female    DOB: [birth year], 70 y.o.   MRN: [id number]  HPI  Has not been taking all meds.  Has not been following BS.  Has lost some wt.  She is now ready to refocus on her health.  Denies any symptoms of CP, lightheadedness, SOB or swelling    Review of Systems     Objective:   Physical Exam HEENT nl Neck without thryomegally Lungs clear. Cardiac RRR Ext no edema.       Assessment & Plan:

## 2010-11-23 NOTE — Assessment & Plan Note (Signed)
Poor control.  Added norvasc

## 2010-11-23 NOTE — Assessment & Plan Note (Signed)
Poor control, restrat pravastatin

## 2010-11-23 NOTE — Assessment & Plan Note (Signed)
Slightly improved A1c since last visit with diet.  Wants to continue diet.  Knows insulin is the next step.

## 2010-12-07 ENCOUNTER — Emergency Department (HOSPITAL_COMMUNITY): Payer: 59

## 2010-12-07 ENCOUNTER — Inpatient Hospital Stay (HOSPITAL_COMMUNITY)
Admission: EM | Admit: 2010-12-07 | Discharge: 2010-12-12 | DRG: 310 | Disposition: A | Discharge status: Home / Self Care | Payer: 59 | Attending: Cardiology | Admitting: Cardiology

## 2010-12-07 ENCOUNTER — Encounter: Payer: Self-pay | Admitting: Cardiology

## 2010-12-07 DIAGNOSIS — E119 Type 2 diabetes mellitus without complications: Secondary | ICD-10-CM | POA: Diagnosis present

## 2010-12-07 DIAGNOSIS — K2980 Duodenitis without bleeding: Secondary | ICD-10-CM | POA: Diagnosis present

## 2010-12-07 DIAGNOSIS — Z79899 Other long term (current) drug therapy: Secondary | ICD-10-CM

## 2010-12-07 DIAGNOSIS — I10 Essential (primary) hypertension: Secondary | ICD-10-CM | POA: Diagnosis present

## 2010-12-07 DIAGNOSIS — M79609 Pain in unspecified limb: Secondary | ICD-10-CM | POA: Diagnosis not present

## 2010-12-07 DIAGNOSIS — K449 Diaphragmatic hernia without obstruction or gangrene: Secondary | ICD-10-CM | POA: Diagnosis present

## 2010-12-07 DIAGNOSIS — I4892 Unspecified atrial flutter: Secondary | ICD-10-CM

## 2010-12-07 DIAGNOSIS — E7800 Pure hypercholesterolemia, unspecified: Secondary | ICD-10-CM | POA: Diagnosis present

## 2010-12-07 DIAGNOSIS — G4733 Obstructive sleep apnea (adult) (pediatric): Secondary | ICD-10-CM | POA: Diagnosis present

## 2010-12-07 DIAGNOSIS — K31819 Angiodysplasia of stomach and duodenum without bleeding: Secondary | ICD-10-CM | POA: Diagnosis present

## 2010-12-07 DIAGNOSIS — Z8249 Family history of ischemic heart disease and other diseases of the circulatory system: Secondary | ICD-10-CM

## 2010-12-07 DIAGNOSIS — E039 Hypothyroidism, unspecified: Secondary | ICD-10-CM | POA: Diagnosis present

## 2010-12-07 DIAGNOSIS — Z860100 Personal history of colon polyps, unspecified: Secondary | ICD-10-CM

## 2010-12-07 DIAGNOSIS — R911 Solitary pulmonary nodule: Secondary | ICD-10-CM | POA: Diagnosis not present

## 2010-12-07 DIAGNOSIS — Z8711 Personal history of peptic ulcer disease: Secondary | ICD-10-CM

## 2010-12-07 DIAGNOSIS — K219 Gastro-esophageal reflux disease without esophagitis: Secondary | ICD-10-CM | POA: Diagnosis present

## 2010-12-07 DIAGNOSIS — I2510 Atherosclerotic heart disease of native coronary artery without angina pectoris: Secondary | ICD-10-CM | POA: Diagnosis present

## 2010-12-07 DIAGNOSIS — Z7982 Long term (current) use of aspirin: Secondary | ICD-10-CM

## 2010-12-07 HISTORY — DX: Unspecified atrial flutter: I48.92

## 2010-12-07 LAB — DIFFERENTIAL
Basophils Absolute: 0.1 10*3/uL (ref 0.0–0.1)
Basophils Relative: 1 % (ref 0–1)
Eosinophils Absolute: 0.4 10*3/uL (ref 0.0–0.7)
Eosinophils Relative: 4 % (ref 0–5)
Lymphocytes Relative: 39 % (ref 12–46)
Lymphs Abs: 3.7 10*3/uL (ref 0.7–4.0)
Monocytes Absolute: 0.7 10*3/uL (ref 0.1–1.0)
Monocytes Relative: 7 % (ref 3–12)
Neutro Abs: 4.7 10*3/uL (ref 1.7–7.7)
Neutrophils Relative %: 50 % (ref 43–77)

## 2010-12-07 LAB — CBC
HCT: 38.1 % (ref 36.0–46.0)
Hemoglobin: 12.5 g/dL (ref 12.0–15.0)
MCH: 27.5 pg (ref 26.0–34.0)
MCHC: 32.8 g/dL (ref 30.0–36.0)
MCV: 83.9 fL (ref 78.0–100.0)
Platelets: 245 10*3/uL (ref 150–400)
RBC: 4.54 MIL/uL (ref 3.87–5.11)
RDW: 16.9 % — ABNORMAL HIGH (ref 11.5–15.5)
WBC: 9.4 10*3/uL (ref 4.0–10.5)

## 2010-12-07 LAB — D-DIMER, QUANTITATIVE: D-Dimer, Quant: 0.55 ug/mL-FEU — ABNORMAL HIGH (ref 0.00–0.48)

## 2010-12-07 LAB — GLUCOSE, CAPILLARY: Glucose-Capillary: 191 mg/dL — ABNORMAL HIGH (ref 70–99)

## 2010-12-07 LAB — POCT I-STAT, CHEM 8
BUN: 22 mg/dL (ref 6–23)
Calcium, Ion: 1.21 mmol/L (ref 1.12–1.32)
Chloride: 106 mEq/L (ref 96–112)
Creatinine, Ser: 0.8 mg/dL (ref 0.50–1.10)
Glucose, Bld: 197 mg/dL — ABNORMAL HIGH (ref 70–99)
HCT: 40 % (ref 36.0–46.0)
Hemoglobin: 13.6 g/dL (ref 12.0–15.0)
Potassium: 3.9 mEq/L (ref 3.5–5.1)
Sodium: 137 mEq/L (ref 135–145)
TCO2: 23 mmol/L (ref 0–100)

## 2010-12-07 LAB — PROTIME-INR
INR: 0.91 (ref 0.00–1.49)
Prothrombin Time: 12.4 seconds (ref 11.6–15.2)

## 2010-12-07 LAB — APTT: aPTT: 32 seconds (ref 24–37)

## 2010-12-07 LAB — POCT I-STAT TROPONIN I
Troponin i, poc: 0.02 ng/mL (ref 0.00–0.08)
Troponin i, poc: 0 ng/mL (ref 0.00–0.08)

## 2010-12-07 LAB — CK TOTAL AND CKMB (NOT AT ARMC)
CK, MB: 2.6 ng/mL (ref 0.3–4.0)
Relative Index: 1.7 (ref 0.0–2.5)
Total CK: 149 U/L (ref 7–177)

## 2010-12-07 LAB — PRO B NATRIURETIC PEPTIDE: Pro B Natriuretic peptide (BNP): 196.2 pg/mL — ABNORMAL HIGH (ref 0–125)

## 2010-12-08 ENCOUNTER — Observation Stay (HOSPITAL_COMMUNITY): Payer: 59

## 2010-12-08 DIAGNOSIS — G4730 Sleep apnea, unspecified: Secondary | ICD-10-CM

## 2010-12-08 DIAGNOSIS — R948 Abnormal results of function studies of other organs and systems: Secondary | ICD-10-CM

## 2010-12-08 DIAGNOSIS — R1310 Dysphagia, unspecified: Secondary | ICD-10-CM

## 2010-12-08 DIAGNOSIS — G4710 Hypersomnia, unspecified: Secondary | ICD-10-CM

## 2010-12-08 DIAGNOSIS — R0602 Shortness of breath: Secondary | ICD-10-CM

## 2010-12-08 DIAGNOSIS — R918 Other nonspecific abnormal finding of lung field: Secondary | ICD-10-CM

## 2010-12-08 LAB — GLUCOSE, CAPILLARY
Glucose-Capillary: 157 mg/dL — ABNORMAL HIGH (ref 70–99)
Glucose-Capillary: 182 mg/dL — ABNORMAL HIGH (ref 70–99)
Glucose-Capillary: 183 mg/dL — ABNORMAL HIGH (ref 70–99)
Glucose-Capillary: 184 mg/dL — ABNORMAL HIGH (ref 70–99)
Glucose-Capillary: 184 mg/dL — ABNORMAL HIGH (ref 70–99)

## 2010-12-08 LAB — LIPID PANEL
Cholesterol: 196 mg/dL (ref 0–200)
HDL: 45 mg/dL (ref >39)
LDL Cholesterol: 111 mg/dL — ABNORMAL HIGH (ref 0–99)
Total CHOL/HDL Ratio: 4.4 RATIO
Triglycerides: 199 mg/dL — ABNORMAL HIGH (ref <150)
VLDL: 40 mg/dL (ref 0–40)

## 2010-12-08 LAB — CARDIAC PANEL(CRET KIN+CKTOT+MB+TROPI)
CK, MB: 2.3 ng/mL (ref 0.3–4.0)
Relative Index: 1.7 (ref 0.0–2.5)
Total CK: 134 U/L (ref 7–177)
Troponin I: <0.3 ng/mL (ref <0.30)

## 2010-12-08 LAB — HEMOGLOBIN A1C
Hgb A1c MFr Bld: 8.6 % — ABNORMAL HIGH (ref <5.7)
Mean Plasma Glucose: 200 mg/dL — ABNORMAL HIGH (ref <117)

## 2010-12-08 LAB — PRO B NATRIURETIC PEPTIDE: Pro B Natriuretic peptide (BNP): 157.7 pg/mL — ABNORMAL HIGH (ref 0–125)

## 2010-12-08 LAB — TSH: TSH: 7.515 u[IU]/mL — ABNORMAL HIGH (ref 0.350–4.500)

## 2010-12-08 LAB — TROPONIN I: Troponin I: <0.3 ng/mL (ref <0.30)

## 2010-12-08 MED ORDER — IOHEXOL 300 MG/ML  SOLN
80.0000 mL | Freq: Once | INTRAMUSCULAR | Status: AC | PRN
  Administered 2010-12-08: 80 mL via INTRAVENOUS

## 2010-12-09 DIAGNOSIS — R1310 Dysphagia, unspecified: Secondary | ICD-10-CM

## 2010-12-09 DIAGNOSIS — I517 Cardiomegaly: Secondary | ICD-10-CM

## 2010-12-09 DIAGNOSIS — R079 Chest pain, unspecified: Secondary | ICD-10-CM

## 2010-12-09 DIAGNOSIS — R948 Abnormal results of function studies of other organs and systems: Secondary | ICD-10-CM

## 2010-12-09 LAB — COMPREHENSIVE METABOLIC PANEL
ALT: 20 U/L (ref 0–35)
AST: 17 U/L (ref 0–37)
Albumin: 3.5 g/dL (ref 3.5–5.2)
Alkaline Phosphatase: 89 U/L (ref 39–117)
BUN: 14 mg/dL (ref 6–23)
CO2: 27 mEq/L (ref 19–32)
Calcium: 9.7 mg/dL (ref 8.4–10.5)
Chloride: 99 mEq/L (ref 96–112)
Creatinine, Ser: 0.63 mg/dL (ref 0.50–1.10)
GFR calc Af Amer: >90 mL/min (ref >90)
GFR calc non Af Amer: 89 mL/min — ABNORMAL LOW (ref >90)
Glucose, Bld: 226 mg/dL — ABNORMAL HIGH (ref 70–99)
Potassium: 3.9 mEq/L (ref 3.5–5.1)
Sodium: 136 mEq/L (ref 135–145)
Total Bilirubin: 0.3 mg/dL (ref 0.3–1.2)
Total Protein: 7.7 g/dL (ref 6.0–8.3)

## 2010-12-09 LAB — GLUCOSE, CAPILLARY
Glucose-Capillary: 196 mg/dL — ABNORMAL HIGH (ref 70–99)
Glucose-Capillary: 201 mg/dL — ABNORMAL HIGH (ref 70–99)
Glucose-Capillary: 162 mg/dL — ABNORMAL HIGH (ref 70–99)
Glucose-Capillary: 183 mg/dL — ABNORMAL HIGH (ref 70–99)

## 2010-12-10 LAB — GLUCOSE, CAPILLARY
Glucose-Capillary: 245 mg/dL — ABNORMAL HIGH (ref 70–99)
Glucose-Capillary: 202 mg/dL — ABNORMAL HIGH (ref 70–99)
Glucose-Capillary: 132 mg/dL — ABNORMAL HIGH (ref 70–99)
Glucose-Capillary: 159 mg/dL — ABNORMAL HIGH (ref 70–99)

## 2010-12-11 ENCOUNTER — Encounter (HOSPITAL_COMMUNITY): Payer: 59

## 2010-12-11 DIAGNOSIS — M79609 Pain in unspecified limb: Secondary | ICD-10-CM

## 2010-12-11 LAB — GLUCOSE, CAPILLARY
Glucose-Capillary: 178 mg/dL — ABNORMAL HIGH (ref 70–99)
Glucose-Capillary: 224 mg/dL — ABNORMAL HIGH (ref 70–99)
Glucose-Capillary: 143 mg/dL — ABNORMAL HIGH (ref 70–99)
Glucose-Capillary: 134 mg/dL — ABNORMAL HIGH (ref 70–99)

## 2010-12-11 LAB — H. PYLORI ANTIBODY, IGG: H Pylori IgG: 0.5 {ISR}

## 2010-12-12 ENCOUNTER — Telehealth: Payer: Self-pay | Admitting: Family Medicine

## 2010-12-12 LAB — PROTIME-INR
INR: 0.95 (ref 0.00–1.49)
Prothrombin Time: 12.9 seconds (ref 11.6–15.2)

## 2010-12-12 LAB — GLUCOSE, CAPILLARY
Glucose-Capillary: 215 mg/dL — ABNORMAL HIGH (ref 70–99)
Glucose-Capillary: 179 mg/dL — ABNORMAL HIGH (ref 70–99)
Glucose-Capillary: 113 mg/dL — ABNORMAL HIGH (ref 70–99)

## 2010-12-12 NOTE — Telephone Encounter (Signed)
Hospitalized at [facility] for A fib/flutter.  Will be DCed on coumadin.

## 2010-12-12 NOTE — Telephone Encounter (Signed)
Pt is asking to speak with Dr Leveda Anna - is at [facility]- rm 1411 admitted last Thursday and wants to talk with him asap

## 2010-12-14 ENCOUNTER — Other Ambulatory Visit (INDEPENDENT_AMBULATORY_CARE_PROVIDER_SITE_OTHER): Payer: 59

## 2010-12-14 DIAGNOSIS — I480 Paroxysmal atrial fibrillation: Secondary | ICD-10-CM | POA: Insufficient documentation

## 2010-12-14 DIAGNOSIS — I4891 Unspecified atrial fibrillation: Secondary | ICD-10-CM

## 2010-12-14 DIAGNOSIS — Z7901 Long term (current) use of anticoagulants: Secondary | ICD-10-CM

## 2010-12-14 DIAGNOSIS — IMO0002 Reserved for concepts with insufficient information to code with codable children: Secondary | ICD-10-CM

## 2010-12-14 LAB — POCT INR: INR: 1

## 2010-12-14 NOTE — Progress Notes (Signed)
Pt came for INR check,  New onset a.fib/flutter. INR 1.0, Dr. Leveda Anna dosed = 10 mg warfarin today and recheck tomorrow.  Dose written out for pt.  She expresses understanding of medication dose and diet issues/vit Caryl Asp, MLS

## 2010-12-15 ENCOUNTER — Ambulatory Visit (INDEPENDENT_AMBULATORY_CARE_PROVIDER_SITE_OTHER): Payer: 59 | Admitting: *Deleted

## 2010-12-15 ENCOUNTER — Encounter: Payer: Self-pay | Admitting: Family Medicine

## 2010-12-15 ENCOUNTER — Ambulatory Visit (INDEPENDENT_AMBULATORY_CARE_PROVIDER_SITE_OTHER): Payer: 59

## 2010-12-15 ENCOUNTER — Inpatient Hospital Stay (INDEPENDENT_AMBULATORY_CARE_PROVIDER_SITE_OTHER)
Admission: RE | Admit: 2010-12-15 | Discharge: 2010-12-15 | Disposition: A | Discharge status: Home / Self Care | Payer: Self-pay | Source: Ambulatory Visit | Attending: Emergency Medicine | Admitting: Emergency Medicine

## 2010-12-15 DIAGNOSIS — S139XXA Sprain of joints and ligaments of unspecified parts of neck, initial encounter: Secondary | ICD-10-CM

## 2010-12-15 DIAGNOSIS — S335XXA Sprain of ligaments of lumbar spine, initial encounter: Secondary | ICD-10-CM

## 2010-12-15 DIAGNOSIS — I4891 Unspecified atrial fibrillation: Secondary | ICD-10-CM

## 2010-12-15 DIAGNOSIS — IMO0002 Reserved for concepts with insufficient information to code with codable children: Secondary | ICD-10-CM

## 2010-12-15 DIAGNOSIS — Z7901 Long term (current) use of anticoagulants: Secondary | ICD-10-CM

## 2010-12-15 LAB — POCT INR: INR: 1.1

## 2010-12-15 NOTE — H&P (Signed)
NAMEMarland Kitchen  MADYSUN, THALL NO.:  [id number]  MEDICAL RECORD NO.:  [id number]  LOCATION:  WLED                         FACILITY:  [facility]  PHYSICIAN:  Gerrit Friends. Dietrich Pates, MD, FACCDATE OF BIRTH:  [birth year]  DATE OF ADMISSION:  12/07/2010 DATE OF DISCHARGE:                             HISTORY & PHYSICAL   REFERRING PHYSICIAN:  Dr. Lynelle Doctor.  PRIMARY CARE PHYSICIAN:  Santiago Bumpers. Leveda Anna, M.D.  PRIMARY CARDIOLOGIST:  [name]. Eden Emms, MD, [facility]  HISTORY OF PRESENT ILLNESS:  A 70 year old woman presenting to emergency department with the sudden onset of palpitations and found to have atrial flutter.  [name] has been followed by Dr. Eden Emms for number of years due to the presence of multiple cardiovascular risk factors with intermittent symptoms including chest discomfort, exertional dyspnea, and palpitations.  Evaluations in the past have been negative including cardiac catheterization in 2004 revealing very mild nonobstructive coronary artery disease, and a normal echocardiogram in 2009, and a reportedly negative stress nuclear study in February of this year.  Over the past 2 weeks, patient has noted episodes of dyspnea. This sounds somewhat intermediate between air hunger and a sensation that she needs a deep breath.  Symptoms have occurred at rest and with exertion and passed spontaneously over a relatively brief period of time.  She has not identified anything that makes these symptoms worse or better.  She does describe some spells that sound like they could represent orthopnea or PND.  This afternoon, she noted the onset of a sensation of discomfort in her epigastrium and/or lower chest.  There was no dyspnea nor diaphoresis.  She took her blood pressure with an automatic sphygmomanometer that indicated a very high heart rate.  She subsequently noted palpitations and had continuing discomfort prompting her to drive herself to the emergency department where  she was found to have atrial flutter.  She was given intravenous diltiazem with some slowing of her heart rate, but re-acceleration with modest effort.  She has been given flecainide 300 mg p.o. with subsequent conversion back to sinus rhythm and relief of all symptoms.  PAST MEDICAL HISTORY:  Notable for longstanding hypertension, that has been well controlled, diabetes that has been fairly well controlled, obesity, and hyperlipidemia.  She has been diagnosed with obstructive sleep apnea, but does not utilize any treatment for the same.  She has a history of GERD, hypothyroidism, peptic ulcer, hiatal hernia and has had a polyp removed via colonoscope.  SURGERIES:  Have included cataract extraction and carpal tunnel release.  CURRENT MEDICATIONS: 1. Metoprolol succinate 50 mg daily. 2. Pravastatin 20 mg daily. 3. Amlodipine 10 mg daily. 4. Aspirin 81 mg daily. 5. Hydrochlorothiazide 25 mg daily. 6. Metformin 1000 mg b.i.d. 7. Levothyroxine 0.12 mg daily.  ALLERGIES:  PENICILLIN, SULFA, RAMIPRIL have been reported.  SOCIAL HISTORY:  The patient is a former cigarette smoker, but not for many years.  She denies any significant use of alcohol.  FAMILY HISTORY:  Positive for coronary artery disease, diabetes, and a hemangioma.  REVIEW OF SYSTEMS:  Denies daytime somnolence.  She has easy fatigability with decreased exercise tolerance.  All other systems reviewed and are negative.  PHYSICAL EXAMINATION:  GENERAL:  A pleasant overweight woman, in no acute  distress. VITAL SIGNS:  The blood pressure is 135/75, heart rate 72 and regular, respirations 20, O2 saturation 99% on 2 L of nasal O2. HEENT:  EOMS full; pupils are equal, round, and reactive to light; normal lids and conjunctiva; normal oral mucosa.  NECK:  No jugular venous distention; normal carotid upstrokes without bruits. ENDOCRINE:  No thyromegaly. HEMATOPOIETIC:  No adenopathy. PSYCHIATRIC:  Alert and oriented; normal  affect. SKIN:  Multiple pigmented nevi. LUNGS:  Few bibasilar rales. CARDIAC:  Normal first and second heart sounds; 4th heart sound present. ABDOMEN:  Soft and nontender; normal bowel sounds; no masses; no organomegaly. EXTREMITIES:  No edema; normal distal pulses.  NEUROLOGIC:  Symmetric strength and tone; normal cranial nerves.  LABORATORY:  Notable for a normal chemistry profile except for a glucose of 191, normal CBC, and normal coagulation studies.  Point-of-care markers were negative.  Chest x-ray is pending.  EKG shows atrial flutter with 2 to 1 AV conduction.  Nondiagnostic Q-waves are present inferiorly.  There is delayed R-wave progression, possibly representing a previous anterior MI.  T-waves are flat inferiorly.  IMPRESSION:  Ms. Vallie has a history of hypertension and thyroid disease, either or both of which could be contributing to her likelihood of atrial arrhythmias.  She has no known coronary artery disease, but certainly could have developed a significant obstructive disease since her negative catheterization 8 years ago.  A stress test 6 months ago with negative findings is somewhat reassuring.  She also had a repeat echocardiogram in February 2012, which again was normal.  It is not clear that imaging studies should be repeated.  A TSH will be checked, but I doubt that she is hyperthyroid.  She has not tolerated an increase dose beta-blocker in the past.  Diltiazem will be substituted for amlodipine in the hope of providing adequate AV block so that her heart rate will not be 135 when atrial flutter recurs.  Until we have some idea of the likely frequency of episodes, I would not start an antiarrhythmic agent or proceed to radiofrequency ablation.  Hypertension has apparently been well controlled.  That can be reassessed in hospital.  A hemoglobin A1c level and lipid profile will also be obtained.  Her history of recent episodes of dyspnea is of some  concern, but it sounds as if similar symptoms have been present in the past without significant pathology.  A D-dimer level will be obtained as well as a chest x-ray.  She may have a component of pickwickian syndrome.  A BNP level is also pending. Once pulmonary embolism and congestive heart failure have been ruled out, other potential etiologies can be evaluated on an outpatient basis.  The patient is being placed in observation status.  Hopefully, she can be discharged from [facility] within 24 hours.     Gerrit Friends. Dietrich Pates, MD, [facility]     RMR/MEDQ  D:  12/07/2010  T:  12/07/2010  Job:  [id number]  Electronically Signed by Concord Bing MD [facility] on 12/15/2010 03:03:11 PM
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
## 2010-12-18 ENCOUNTER — Ambulatory Visit (INDEPENDENT_AMBULATORY_CARE_PROVIDER_SITE_OTHER): Payer: 59 | Admitting: *Deleted

## 2010-12-18 DIAGNOSIS — I4891 Unspecified atrial fibrillation: Secondary | ICD-10-CM

## 2010-12-18 DIAGNOSIS — Z7901 Long term (current) use of anticoagulants: Secondary | ICD-10-CM

## 2010-12-18 DIAGNOSIS — IMO0002 Reserved for concepts with insufficient information to code with codable children: Secondary | ICD-10-CM

## 2010-12-18 LAB — POCT INR: INR: 2.1

## 2010-12-19 ENCOUNTER — Encounter: Payer: Self-pay | Admitting: Family Medicine

## 2010-12-22 ENCOUNTER — Ambulatory Visit (INDEPENDENT_AMBULATORY_CARE_PROVIDER_SITE_OTHER): Payer: 59 | Admitting: *Deleted

## 2010-12-22 ENCOUNTER — Ambulatory Visit: Payer: 59

## 2010-12-22 DIAGNOSIS — IMO0002 Reserved for concepts with insufficient information to code with codable children: Secondary | ICD-10-CM

## 2010-12-22 DIAGNOSIS — I4891 Unspecified atrial fibrillation: Secondary | ICD-10-CM

## 2010-12-22 DIAGNOSIS — Z7901 Long term (current) use of anticoagulants: Secondary | ICD-10-CM

## 2010-12-22 LAB — POCT INR: INR: 4.7

## 2010-12-25 ENCOUNTER — Ambulatory Visit (INDEPENDENT_AMBULATORY_CARE_PROVIDER_SITE_OTHER): Payer: 59 | Admitting: *Deleted

## 2010-12-25 DIAGNOSIS — IMO0002 Reserved for concepts with insufficient information to code with codable children: Secondary | ICD-10-CM

## 2010-12-25 DIAGNOSIS — I4891 Unspecified atrial fibrillation: Secondary | ICD-10-CM

## 2010-12-25 DIAGNOSIS — Z7901 Long term (current) use of anticoagulants: Secondary | ICD-10-CM

## 2010-12-25 LAB — POCT INR: INR: 2.4

## 2010-12-26 ENCOUNTER — Ambulatory Visit (INDEPENDENT_AMBULATORY_CARE_PROVIDER_SITE_OTHER): Payer: 59 | Admitting: Pulmonary Disease

## 2010-12-26 ENCOUNTER — Encounter: Payer: Self-pay | Admitting: Pulmonary Disease

## 2010-12-26 DIAGNOSIS — R0989 Other specified symptoms and signs involving the circulatory and respiratory systems: Secondary | ICD-10-CM

## 2010-12-26 DIAGNOSIS — R911 Solitary pulmonary nodule: Secondary | ICD-10-CM

## 2010-12-26 DIAGNOSIS — R0600 Dyspnea, unspecified: Secondary | ICD-10-CM | POA: Insufficient documentation

## 2010-12-26 DIAGNOSIS — J984 Other disorders of lung: Secondary | ICD-10-CM

## 2010-12-26 DIAGNOSIS — R0609 Other forms of dyspnea: Secondary | ICD-10-CM

## 2010-12-26 NOTE — Assessment & Plan Note (Signed)
The patient has significant dyspnea on exertion that I think is multifactorial.  She has significant centripetal obesity with deconditioning, has had recent atrial fibrillation, and has a very enlarged heart by chest x-ray with diastolic dysfunction on echocardiogram.  It is unclear whether she has additional lung disease.  She did not have a pulmonary embolus or an interstitial process on chest CT, and she will need full PFTs in order to evaluate for obstructive lung disease in light of her history of smoking.  I have asked her to work aggressively on weight loss and conditioning, and we'll schedule for PFTs in the next one to 2 weeks.  She also has an upcoming appointment with cardiology.

## 2010-12-26 NOTE — Patient Instructions (Signed)
Will set up for breathing tests, and see you back same day to review Work on weight loss and conditioning Will set up ct chest in Jan to followup your pulmonary nodule.

## 2010-12-26 NOTE — Assessment & Plan Note (Signed)
The patient has a 9 mm nodule in the right upper lobe of unknown origin.  She does have a history of smoking in the past, and therefore is at high risk per Fleischner criteria.  She will need a followup scan in 3 months, and we'll go ahead and arrange this for her.

## 2010-12-26 NOTE — Progress Notes (Signed)
  Subjective:    Patient ID: Courtney Pena, female    DOB: [birth year], 70 y.o.   MRN: [id number]  HPI The patient is a 70 year old female who I've been asked to see for dyspnea and also a pulmonary nodule.  She was recently in the hospital for shortness of breath, and found to have new onset atrial fibrillation.  She did have a chest CT which showed no pulmonary embolus, but did show a 9 mm pulmonary nodule in the right upper lobe that will need followup.  The patient was treated with Cardizem for rate control, and also placed on anticoagulation.  She converted spontaneously while in the hospital.  The patient gives a history of worsening dyspnea on exertion for the last 2 months.  She describes significant shortness of breath at one half block at moderate pace on flat ground.  She will get winded bringing groceries in from the car, and also doing light housework.  She admits that she does very little exertional activities, and states that her weight is neutral over the last one year.  She has no significant cough or mucus production.  She does have a history of smoking 2 packs per day for 30 years, but has not done so since 1995.  She has never had pulmonary function studies.  She has had significant lower extremity edema which currently is at baseline.   Review of Systems  Constitutional: Negative for fever and unexpected weight change.  HENT: Positive for congestion. Negative for ear pain, nosebleeds, sore throat, rhinorrhea, sneezing, trouble swallowing, dental problem, postnasal drip and sinus pressure.   Eyes: Negative for redness and itching.  Respiratory: Positive for shortness of breath. Negative for cough, chest tightness and wheezing.   Cardiovascular: Negative for palpitations and leg swelling.  Gastrointestinal: Negative for nausea and vomiting.  Genitourinary: Negative for dysuria.  Musculoskeletal: Negative for joint swelling.  Skin: Negative for rash.  Neurological: Negative  for headaches.  Hematological: Does not bruise/bleed easily.  Psychiatric/Behavioral: Negative for dysphoric mood. The patient is not nervous/anxious.        Objective:   Physical Exam Constitutional:  Morbidly obese female, no acute distress  HENT:  Nares patent without discharge  Oropharynx without exudate, palate and uvula are elongated  Eyes:  Perrla, eomi, no scleral icterus  Neck:  No JVD, no TMG  Cardiovascular:  Normal rate, regular rhythm, no rubs or gallops.  2/6 sem        Intact distal pulses  Pulmonary :  Normal breath sounds, no stridor or respiratory distress   No rales, rhonchi, or wheezing  Abdominal:  Soft, nondistended, bowel sounds present.  No tenderness noted.   Musculoskeletal: 1+ lower extremity edema noted.  Lymph Nodes:  No cervical lymphadenopathy noted  Skin:  No cyanosis noted  Neurologic:  Alert, appropriate, moves all 4 extremities without obvious deficit.         Assessment & Plan:

## 2010-12-27 ENCOUNTER — Ambulatory Visit (INDEPENDENT_AMBULATORY_CARE_PROVIDER_SITE_OTHER): Payer: 59 | Admitting: *Deleted

## 2010-12-27 ENCOUNTER — Ambulatory Visit: Payer: 59 | Admitting: Family Medicine

## 2010-12-27 DIAGNOSIS — Z7901 Long term (current) use of anticoagulants: Secondary | ICD-10-CM

## 2010-12-27 DIAGNOSIS — IMO0002 Reserved for concepts with insufficient information to code with codable children: Secondary | ICD-10-CM

## 2010-12-27 DIAGNOSIS — I4891 Unspecified atrial fibrillation: Secondary | ICD-10-CM

## 2010-12-27 LAB — POCT INR: INR: 2.8

## 2010-12-28 ENCOUNTER — Encounter: Payer: Self-pay | Admitting: Cardiovascular Disease

## 2010-12-28 ENCOUNTER — Ambulatory Visit (INDEPENDENT_AMBULATORY_CARE_PROVIDER_SITE_OTHER): Payer: 59 | Admitting: Cardiovascular Disease

## 2010-12-28 DIAGNOSIS — E7800 Pure hypercholesterolemia, unspecified: Secondary | ICD-10-CM

## 2010-12-28 DIAGNOSIS — I2510 Atherosclerotic heart disease of native coronary artery without angina pectoris: Secondary | ICD-10-CM

## 2010-12-28 DIAGNOSIS — I10 Essential (primary) hypertension: Secondary | ICD-10-CM

## 2010-12-28 DIAGNOSIS — IMO0002 Reserved for concepts with insufficient information to code with codable children: Secondary | ICD-10-CM

## 2010-12-28 DIAGNOSIS — I480 Paroxysmal atrial fibrillation: Secondary | ICD-10-CM | POA: Insufficient documentation

## 2010-12-28 DIAGNOSIS — E118 Type 2 diabetes mellitus with unspecified complications: Secondary | ICD-10-CM

## 2010-12-28 DIAGNOSIS — E1165 Type 2 diabetes mellitus with hyperglycemia: Secondary | ICD-10-CM

## 2010-12-28 DIAGNOSIS — I4891 Unspecified atrial fibrillation: Secondary | ICD-10-CM

## 2010-12-28 NOTE — Patient Instructions (Signed)
Your physician recommends that you schedule a follow-up appointment in 3 MONTHS. 

## 2010-12-28 NOTE — Assessment & Plan Note (Signed)
Cholesterol is at goal.  Continue current dose of statin and diet Rx.  No myalgias or side effects.  F/U  LFT's in 6 months. Lab Results  Component Value Date   LDLCALC 111* 12/08/2010             

## 2010-12-28 NOTE — Assessment & Plan Note (Signed)
Continue anticoagulation cardizem and beta blocker.  In NSR in office today

## 2010-12-28 NOTE — Progress Notes (Signed)
Courtney Pena is seen today for F/U of dyspnea, palpitations, HTN and mild CAD. Cath in 2004 showed mild plaque and she had a nonischemic myovue in 2007. Last myovue 3/12 also normal with lexiscan and EF 64%  Her BS is suboptimally controlled due to dietary indiscretion. Her A1c is above 7 again. However at one point she was above 11. She decreased her Toprol on her own to 50mg  as higher doses made her fatigued. She has had multiple side effects to ARB's and ACE inhibitors including angioedema. She continues to have occasional flip flops in her heart but no sustained arrythmia. She has exertional dyspnea that is functional due to her sedentary nature and obesity. Her BP is not adequately controlled. She had normal Ao and renals on cath in 2004 and I think her HTN is essential. She has had a lot of stess lately with her husband being diagnosed with gastric cancer.  No syncope,pleurisy,or diaphoresis  Hospitalized recently with PAF.  Started on coumadin.  Monitors INR at [facility] practice.  No evidence of recurrence. Normal EF by echo in hospital  ROS: Denies fever, malais, weight loss, blurry vision, decreased visual acuity, cough, sputum, SOB, hemoptysis, pleuritic pain, palpitaitons, heartburn, abdominal pain, melena, lower extremity edema, claudication, or rash.  All other systems reviewed and negative  General: Affect appropriate Healthy:  appears stated age HEENT: normal Neck supple with no adenopathy JVP normal no bruits no thyromegaly Lungs clear with no wheezing and good diaphragmatic motion Heart:  S1/S2 no murmur,rub, gallop or click PMI normal Abdomen: benighn, BS positve, no tenderness, no AAA no bruit.  No HSM or HJR Distal pulses intact with no bruits No edema Neuro non-focal Skin warm and dry No muscular weakness   Current Outpatient Prescriptions  Medication Sig Dispense Refill  . amLODipine (NORVASC) 10 MG tablet Take 1 tablet (10 mg total) by mouth daily.  30 tablet  11  .  aspirin 81 MG tablet Take 81 mg by mouth daily.        . Cholecalciferol (VITAMIN D3) 1000 UNITS CAPS Take 2 capsules by mouth daily.        Marland Kitchen diltiazem (CARDIZEM CD) 180 MG 24 hr capsule Take 180 mg by mouth daily.        Marland Kitchen GLIPIZIDE XL 10 MG 24 hr tablet TAKE 1 TABLET ONCE DAILY  90 each  3  . glucose blood (ONE TOUCH TEST STRIPS) test strip Check blood sugars three time a day  300 each  3  . hydrochlorothiazide 25 MG tablet TAKE 1 TABLET ONCE DAILY  90 tablet  2  . iron polysaccharides (NIFEREX) 150 MG capsule Take 150 mg by mouth daily.        Marland Kitchen JANUVIA 100 MG tablet TAKE 1 TABLET ONCE DAILY  90 tablet  2  . levothyroxine (SYNTHROID, LEVOTHROID) 125 MCG tablet Take 125 mcg by mouth daily.        . metFORMIN (GLUCOPHAGE) 1000 MG tablet TAKE ONE TABLET BY MOUTH TWICE DAILY WITH MEALS  180 tablet  3  . metoprolol (TOPROL XL) 100 MG 24 hr tablet Take 1 tablet (100 mg total) by mouth daily.  90 tablet  3  . omeprazole (PRILOSEC) 40 MG capsule Take 1 capsule (40 mg total) by mouth daily. For stomach  90 capsule  3  . propranolol (INDERAL) 10 MG tablet Take 10 mg by mouth as needed.        . warfarin (COUMADIN) 5 MG tablet Take 5 mg by  mouth. Take as directed        Current Facility-Administered Medications  Medication Dose Route Frequency Provider Last Rate Last Dose  . 0.9 %  sodium chloride infusion  500 mL Intravenous Continuous Sheryn Bison, MD        Allergies  Amoxicillin; Lisinopril; Losartan potassium; Penicillins; Pioglitazone; and Sulfamethoxazole  Electrocardiogram:  Assessment and Plan

## 2010-12-28 NOTE — Assessment & Plan Note (Signed)
Discussed low carb diet.  Target hemoglobin A1c is 6.5 or less.  Continue current medications.  

## 2010-12-28 NOTE — Assessment & Plan Note (Signed)
Well controlled.  Continue current medications and low sodium Dash type diet.    

## 2010-12-28 NOTE — Assessment & Plan Note (Signed)
No critical stenosis.  CRF modification.  Recent nonischemic myovue

## 2010-12-29 NOTE — Discharge Summary (Signed)
NAMEMarland Kitchen  Courtney Pena, Courtney Pena NO.:  [id number]  MEDICAL RECORD NO.:  [id number]  LOCATION:                                 FACILITY:  PHYSICIAN:  Vesta Mixer, M.D. DATE OF BIRTH:  February 09, 1941  DATE OF ADMISSION:  12/07/2010 DATE OF DISCHARGE:  12/11/2010                              DISCHARGE SUMMARY   PROCEDURES: 1. Two-view chest x-ray. 2. CT angiogram of the chest. 3. 2D echocardiogram.  PRIMARY FINAL DISCHARGE DIAGNOSIS:  Paroxysmal atrial fibrillation/atrial flutter.  SECONDARY DIAGNOSES: 1. Hypertension. 2. Diabetes. 3. Obesity. 4. Hyperlipidemia. 5. Obstructive sleep apnea, not on CPAP. 6. Gastroesophageal reflux disease. 7. Hypothyroidism. 8. History of peptic ulcer disease and hiatal hernia. 9. Status post colonoscopy with polypectomy as well as cataract     surgery and carpal tunnel surgery. 10.Family history of coronary artery disease. 11.Status post cardiac catheterization in 2004 showing  left anterior descending 60% and 40%, circumflex 30%, dominant right coronary artery luminal irregularities. 1. Preserved left ventricular function with an EF of 55% to 60% and     grade 2 diastolic dysfunction by echocardiogram this admission. 2. Anticoagulation with Coumadin, initiated this admission.  TIME AT DISCHARGE:  38 minutes.  HOSPITAL COURSE:  Courtney Pena is a 70 year old female with a history of hypertension and atypical chest pain.  She had palpitations and came to the emergency room where she was in atrial flutter, IV of diltiazem helped with rate control, but she converted to sinus rhythm after a flecainide challenge at 300 mg.  She was admitted for further evaluation and treatment.  Courtney Pena had cardiac enzyme cycles; they were negative.  She had minimal elevation in her BNP at 196.  Her TSH was slightly elevated at 7.515, so her Synthroid was increased of 112 to 125 mcg daily.  She had dyslipidemia with a triglyceride  level of 199 and HDL of 111.  She is to follow up on this.  She had a chest x-ray, which showed cardiomegaly and no acute disease.  Her D-dimer was elevated, so she had a CT angiogram of the chest.  The CT angiogram of the chest showed no PE, but she has circumferentially thickened mucosa of the midthoracic esophagus, recent esophageal carcinoma, as well as a 9 mm noncalcified nodule anteromedially of the right upper lobe.  The radiologist recommended consideration of a primary lung carcinoma versus metastatic lesions.  A GI consult was called.  It was noted that she had an unremarkable esophagus on a recent EGD, so it was recommended that pulmonary malignancy to be ruled out.  The plan was to follow up the nodule with a repeat CT scan in 3 months.  It was felt that the nodule was too small to biopsy at this point.  It was recommended that she get an EGD for further evaluation.  This was performed on December 09, 2010.  There were no mucosal findings to explain the abnormal imaging.  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 Dr. Rhea Belton recommended not treating GAVE unless overt bleeding, anemia or iron deficiency becomes a problem.  We recommended continuing daily PPI therapy.  A 2D echocardiogram was performed and showed a normal EF with diastolic dysfunction.  Initially, the plan was for discharge, but she had pain in her left calf and there was concern for DVT.  She was started on Lovenox and lower extremity Dopplers were performed, which showed no evidence of DVT bilaterally.  She went back into atrial fibrillation/flutter with a rate up to the 120s and 130s.  Her medications were adjusted.  PFTs have been planned but were canceled and can be completed as an outpatient.  On an increased dose of Cardizem, she was able to ambulate with a heart rate in the 90s.  With recurrent atrial fibrillation, she was started on Coumadin, but it was not  felt that she needed full cross coverage with Lovenox.  By December 12, 2010, she had spontaneously converted to sinus rhythm.  On December 12, 2010, at 12 p.m., Courtney Pena was reassessed by Dr. Elease Hashimoto.  She was maintaining sinus rhythm and having no problems with ambulation.  Dr. Elease Hashimoto contacted Dr. Leveda Anna to make sure that she would be able to take Coumadin followup with [facility]. She is, therefore, considered stable for discharge on December 12, 2010, to be followed closely as an outpatient.  DISCHARGE INSTRUCTIONS:  Her activity level is to be increased gradually.  She is encouraged to stick to a low-sodium diabetic diet. She is to follow up with Dr. Shelle Iron for Pulmonary on October 30th at 1:30.  She is to follow up with Dr. Eden Emms on November 1st at 11:45. She is to follow up with Dr. Leveda Anna as scheduled.  She is to get a Coumadin checked this Friday with Dr. Leveda Anna and call for an appointment.  DISCHARGE MEDICATIONS: 1. Cardizem CD 180 mg daily. 2. Synthroid 125 mcg q. day. 3. Propranolol 10 mg q.i.d. p.r.n. 4. Coumadin 5 mg tablets, take 1 tablet daily on Tuesday, Wednesday,     and Thursday and get an INR on Friday. 5. Amlodipine 10 mg a day. 6. Aspirin 81 mg a day. 7. HCTZ 25 mg a day. 8. Meclizine 25 mg q.6 hours p.r.n. 9. Metformin 1000 mg b.i.d. 10.Toprol-XL 50 mg a day. 11.Pravachol 20 mg daily. 12.Synthroid 112 mcg is discontinued.     Theodore Demark, PA-C   ______________________________ Vesta Mixer, M.D.    RB/MEDQ  D:  12/12/2010  T:  12/13/2010  Job:  [id number]  cc:   William A. Leveda Anna, M.D.  Barbaraann Share, MD,FCCP [address] [place] [address]  Electronically Signed by Theodore Demark PA-C on 12/28/2010 03:38:09 PM Electronically Signed by Kristeen Miss M.D. on 12/29/2010 03:19:21 PM

## 2011-01-01 ENCOUNTER — Ambulatory Visit (INDEPENDENT_AMBULATORY_CARE_PROVIDER_SITE_OTHER): Payer: 59 | Admitting: *Deleted

## 2011-01-01 DIAGNOSIS — I4891 Unspecified atrial fibrillation: Secondary | ICD-10-CM

## 2011-01-01 DIAGNOSIS — Z7901 Long term (current) use of anticoagulants: Secondary | ICD-10-CM

## 2011-01-01 DIAGNOSIS — IMO0002 Reserved for concepts with insufficient information to code with codable children: Secondary | ICD-10-CM

## 2011-01-01 LAB — POCT INR: INR: 3.7

## 2011-01-02 ENCOUNTER — Encounter (HOSPITAL_COMMUNITY): Payer: 59

## 2011-01-02 ENCOUNTER — Ambulatory Visit (HOSPITAL_COMMUNITY)
Admission: RE | Admit: 2011-01-02 | Discharge: 2011-01-02 | Disposition: A | Discharge status: Home / Self Care | Payer: 59 | Source: Ambulatory Visit | Attending: Pulmonary Disease | Admitting: Pulmonary Disease

## 2011-01-02 DIAGNOSIS — R0609 Other forms of dyspnea: Secondary | ICD-10-CM | POA: Insufficient documentation

## 2011-01-02 DIAGNOSIS — R0602 Shortness of breath: Secondary | ICD-10-CM | POA: Insufficient documentation

## 2011-01-02 DIAGNOSIS — R0989 Other specified symptoms and signs involving the circulatory and respiratory systems: Secondary | ICD-10-CM | POA: Insufficient documentation

## 2011-01-02 LAB — PULMONARY FUNCTION TEST

## 2011-01-02 MED ORDER — ALBUTEROL SULFATE (5 MG/ML) 0.5% IN NEBU: 2.5000 mg | INHALATION_SOLUTION | Freq: Once | RESPIRATORY_TRACT | Status: DC

## 2011-01-02 MED ORDER — ALBUTEROL SULFATE (5 MG/ML) 0.5% IN NEBU
2.5000 mg | INHALATION_SOLUTION | Freq: Once | RESPIRATORY_TRACT | Status: AC
  Administered 2011-01-02: 2.5 mg via RESPIRATORY_TRACT

## 2011-01-03 ENCOUNTER — Ambulatory Visit: Payer: 59 | Admitting: Cardiovascular Disease

## 2011-01-04 ENCOUNTER — Inpatient Hospital Stay: Payer: 59 | Admitting: Pulmonary Disease

## 2011-01-10 ENCOUNTER — Ambulatory Visit (INDEPENDENT_AMBULATORY_CARE_PROVIDER_SITE_OTHER): Payer: 59 | Admitting: *Deleted

## 2011-01-10 ENCOUNTER — Encounter: Payer: Self-pay | Admitting: Family Medicine

## 2011-01-10 ENCOUNTER — Ambulatory Visit (INDEPENDENT_AMBULATORY_CARE_PROVIDER_SITE_OTHER): Payer: 59 | Admitting: Family Medicine

## 2011-01-10 DIAGNOSIS — I2510 Atherosclerotic heart disease of native coronary artery without angina pectoris: Secondary | ICD-10-CM

## 2011-01-10 DIAGNOSIS — I4891 Unspecified atrial fibrillation: Secondary | ICD-10-CM

## 2011-01-10 DIAGNOSIS — I480 Paroxysmal atrial fibrillation: Secondary | ICD-10-CM

## 2011-01-10 DIAGNOSIS — IMO0002 Reserved for concepts with insufficient information to code with codable children: Secondary | ICD-10-CM

## 2011-01-10 DIAGNOSIS — K2970 Gastritis, unspecified, without bleeding: Secondary | ICD-10-CM

## 2011-01-10 DIAGNOSIS — E669 Obesity, unspecified: Secondary | ICD-10-CM

## 2011-01-10 DIAGNOSIS — Z7901 Long term (current) use of anticoagulants: Secondary | ICD-10-CM

## 2011-01-10 LAB — POCT INR: INR: 1.9

## 2011-01-12 NOTE — Assessment & Plan Note (Signed)
Likely cause of her epigastric pain.  Back on PPI

## 2011-01-12 NOTE — Assessment & Plan Note (Signed)
Rate controled on current meds.

## 2011-01-12 NOTE — Assessment & Plan Note (Signed)
Addressed the vital nature of weight loss

## 2011-01-12 NOTE — Patient Instructions (Addendum)
Please continue your aspirin Please take your omeprazole regularly See either me or your cardiologist if you continue to have the stomach pain despite the omeprazole.  You may need a cardiac cath.

## 2011-01-12 NOTE — Assessment & Plan Note (Signed)
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  Now with epigastric discomfort.  Place on PPI and consider cath if persists.

## 2011-01-12 NOTE — Progress Notes (Signed)
  Subjective:    Patient ID: Courtney Pena, female    DOB: 02-Jul-1940, 70 y.o.   MRN: [id number]  HPI  Doing well with new Dx of atrial fibrillation.  Still with brief episodes of palpitations.    Has some epigastric discomfort.  No true chest pain.  This is important in that she has known mild CAD from cath several years ago.  No chest pain during recent Afib with RVR.  Has been off her PPI for a while.  Wonders if she should continue aspirin since on coumadin.    Review of Systems     Objective:   Physical Exam Heart RRR without m Lungs clear Ext trace edema        Assessment & Plan:

## 2011-01-16 ENCOUNTER — Telehealth: Payer: Self-pay | Admitting: Pulmonary Disease

## 2011-01-16 NOTE — Telephone Encounter (Signed)
I spoke with pt and is requesting her pft results from 01/02/11. I advised her she was suppose to f/u when she had the pft but stated no one told her that. Offered OV today but stated she could not come in. Next available isn't until 02/01/11 Please advise Dr. Shelle Iron, thanks

## 2011-01-16 NOTE — Telephone Encounter (Signed)
Please let pt know that her breathing studies showed no copd, but did show that her lungs were just slightly smaller than normal because of her weight pushing up on her chest.  Cannot find anything obvious from a lung standpoint to explain her shortness of breath.  Would work on weight loss and conditioning for the next few months and see if things improve.

## 2011-01-17 NOTE — Telephone Encounter (Signed)
I spoke with patient about results and she verbalized understanding and had no questions 

## 2011-01-22 ENCOUNTER — Ambulatory Visit (INDEPENDENT_AMBULATORY_CARE_PROVIDER_SITE_OTHER): Payer: 59 | Admitting: *Deleted

## 2011-01-22 DIAGNOSIS — I4891 Unspecified atrial fibrillation: Secondary | ICD-10-CM

## 2011-01-22 DIAGNOSIS — Z7901 Long term (current) use of anticoagulants: Secondary | ICD-10-CM

## 2011-01-22 DIAGNOSIS — IMO0002 Reserved for concepts with insufficient information to code with codable children: Secondary | ICD-10-CM

## 2011-01-22 LAB — POCT INR: INR: 2.5

## 2011-01-24 ENCOUNTER — Ambulatory Visit: Payer: 59

## 2011-02-02 ENCOUNTER — Encounter: Payer: Self-pay | Admitting: Family Medicine

## 2011-02-02 ENCOUNTER — Encounter: Payer: Self-pay | Admitting: Pulmonary Disease

## 2011-02-02 ENCOUNTER — Ambulatory Visit (INDEPENDENT_AMBULATORY_CARE_PROVIDER_SITE_OTHER): Payer: 59 | Admitting: *Deleted

## 2011-02-02 ENCOUNTER — Ambulatory Visit (INDEPENDENT_AMBULATORY_CARE_PROVIDER_SITE_OTHER): Payer: 59 | Admitting: Family Medicine

## 2011-02-02 VITALS — BP 162/91 | HR 71 | Temp 97.5°F | Ht 66.0 in | Wt 270.5 lb

## 2011-02-02 DIAGNOSIS — Z7901 Long term (current) use of anticoagulants: Secondary | ICD-10-CM

## 2011-02-02 DIAGNOSIS — M549 Dorsalgia, unspecified: Secondary | ICD-10-CM

## 2011-02-02 DIAGNOSIS — I4891 Unspecified atrial fibrillation: Secondary | ICD-10-CM

## 2011-02-02 DIAGNOSIS — I480 Paroxysmal atrial fibrillation: Secondary | ICD-10-CM

## 2011-02-02 DIAGNOSIS — I10 Essential (primary) hypertension: Secondary | ICD-10-CM

## 2011-02-02 DIAGNOSIS — IMO0002 Reserved for concepts with insufficient information to code with codable children: Secondary | ICD-10-CM

## 2011-02-02 LAB — TSH: TSH: 2.026 u[IU]/mL (ref 0.350–4.500)

## 2011-02-02 LAB — POCT GLYCOSYLATED HEMOGLOBIN (HGB A1C): Hemoglobin A1C: 8.9

## 2011-02-02 LAB — POCT INR: INR: 2.1

## 2011-02-02 NOTE — Patient Instructions (Signed)
[employer] Dabigatran which is the new, alternative drug to coumadin.  I would be willing to switch you if you wish. See Dr. Paulino Rily, our clinical pharmacologist, to discuss you meds and to do the 24 hour blood pressure monitoring.

## 2011-02-03 LAB — COMPLETE METABOLIC PANEL WITH GFR
ALT: 27 U/L (ref 0–35)
AST: 18 U/L (ref 0–37)
Albumin: 4.1 g/dL (ref 3.5–5.2)
Alkaline Phosphatase: 96 U/L (ref 39–117)
BUN: 17 mg/dL (ref 6–23)
CO2: 27 mEq/L (ref 19–32)
Calcium: 9.5 mg/dL (ref 8.4–10.5)
Chloride: 100 mEq/L (ref 96–112)
Creat: 0.69 mg/dL (ref 0.50–1.10)
GFR, Est African American: >89 mL/min
GFR, Est Non African American: 88 mL/min
Glucose, Bld: 256 mg/dL — ABNORMAL HIGH (ref 70–99)
Potassium: 4 mEq/L (ref 3.5–5.3)
Sodium: 136 mEq/L (ref 135–145)
Total Bilirubin: 0.4 mg/dL (ref 0.3–1.2)
Total Protein: 7.6 g/dL (ref 6.0–8.3)

## 2011-02-06 ENCOUNTER — Ambulatory Visit (INDEPENDENT_AMBULATORY_CARE_PROVIDER_SITE_OTHER): Payer: 59 | Admitting: Pharmacist

## 2011-02-06 ENCOUNTER — Encounter: Payer: Self-pay | Admitting: Family Medicine

## 2011-02-06 VITALS — BP 141/86 | HR 76 | Ht 66.0 in | Wt 273.8 lb

## 2011-02-06 DIAGNOSIS — I10 Essential (primary) hypertension: Secondary | ICD-10-CM

## 2011-02-06 NOTE — Progress Notes (Signed)
  Subjective:    Patient ID: Courtney Pena, female    DOB: 1940-06-06, 70 y.o.   MRN: [id number]  HPI Patient presents to the clinic Tuesday in good spirits to discuss her medications and for 24-hour ambulatory blood pressure monitoring. She complains of a low-grade headache for several weeks and was concerned it may be due to her medications. She was started on warfarin in September for atrial fibrillation and was curious about other options due to interactions with green leafy vegetables (she enjoys eating kale). Discussed newer drug options; she would like to think more about the options and discuss again in the near future.  Patient admits her diabetes is not controlled (diagnosed with DM for 10-12 years). Goal to lose weight and improve blood sugars. Discussed insulin/other medication options. Admits that she needs to lose weight, exercise more, and reduce sugar intake. Chips/cookies/sweets are a problem for her; she also likes fresh fruits (mangoes, grapes, melons, etc). She currently has a membership to [employer] but has not been going to the gym.  Reports taking her own BP often at home and at work and that the numbers are lower than in the office.  She believes her blood pressure is 120s/60s outside of the office.    Patient reports taking Diltiazem, HCTZ, Amlodipine at ~9am and Metoprolol at about noon.  She was asked to bring home BP wrist monitor to office with her to follow-up. At follow-up we tested the home monitor against the office monitor--the office monitor read 156/90 mmHg vs the wrist monitor 141/82 mmHg.  Review of Systems     Objective:   Physical Exam Filed Vitals:   02/06/11 1430  BP: 141/86  Pulse: 76    BMET    Component Value Date/Time   NA 136 02/02/2011 1120   K 4.0 02/02/2011 1120   CL 100 02/02/2011 1120   CO2 27 02/02/2011 1120   GLUCOSE 256* 02/02/2011 1120   BUN 17 02/02/2011 1120   CREATININE 0.69 02/02/2011 1120   CREATININE 0.63 12/09/2010 0845   CALCIUM 9.5 02/02/2011 1120   GFRNONAA 89* 12/09/2010 0845   GFRAA >90 12/09/2010 0845    ABPM Study Data: (02-06-11 to 02-07-11) Arm Placement: left arm   Overall Mean 24hr BP:  177/90  HR: 77  Daytime Mean BP:  176/90  HR: 78  Nighttime Mean BP:  183/86  HR: 70  Dipping Pattern:  "Non-dipper" [normal dipping ~10-20%]     Assessment & Plan:   70yoF with Stage 2 Hypertension not controlled on current antihypertensive therapy. 24-hour average blood pressure 177/90 mmHg, with non-dipping pattern. Current therapy includes Metoprolol, Diltiazem, hydrochlorothiazide, and amlodipine. Patient has allergy to ACE/ARB. Would recommend Spironolactone 25mg  daily and check BMET one week after initiation. Also would recommend switching amlodipine to evening dosing. Future considerations if needed may include switching beta blocker to carvedilol. Patient following up with Dr. Leveda Anna this Friday 02/09/11 at 8:45am.  Other medication recommendations: In January,  discuss starting insulin with patient if blood glucose levels not improved and discuss alternatives to warfarin, such as Xarelto (rivaroxiban).  Patient seen with Benjaman Pott, PharmD Resident

## 2011-02-06 NOTE — Patient Instructions (Addendum)
1) Start taking Tylenol two times a day 2) Limit diclofenac to only 1 time (max 2 times) per month. If your pain is really bad and you need it more often, come back to the clinic to discuss other options. 3) Start going to the [facility] three times per week 4) Return with BP monitor tomorrow around 4:00PM 5) Schedule follow up visit with Pharmacist in early January.  6) Follow-up with Dr. Leveda Anna 02/09/11 at 8:45am

## 2011-02-07 ENCOUNTER — Telehealth: Payer: Self-pay | Admitting: Family Medicine

## 2011-02-07 ENCOUNTER — Ambulatory Visit: Payer: 59 | Admitting: Family Medicine

## 2011-02-07 NOTE — Assessment & Plan Note (Signed)
Apparent poor control.  She is reluctant to increase meds.  Will confirm with 24 hour BP monitor.

## 2011-02-07 NOTE — Progress Notes (Signed)
  Subjective:    Patient ID: Courtney Pena, female    DOB: 1940/07/03, 70 y.o.   MRN: [id number]  HPI  Patient with several complaints: #1 Back pain.  No change in bowel or bladder.  No fever, dysuria or change in bowel habits.  Seems to be improving #2 BP up.  Home BPs have been fine.  Her home BP cuff did not test well against ours #3 Dislikes coumadin.  Would like to consider alternatives.  Explained pros and cons of dabagantrin.    Review of Systems     Objective:   Physical Exam No CVA tenderness No abd tenderness       Assessment & Plan:

## 2011-02-07 NOTE — Telephone Encounter (Signed)
Needs to speak with RN re: monitor she is suppose to have removed and read today at 4, is having a hard time getting here.

## 2011-02-07 NOTE — Assessment & Plan Note (Signed)
Wants to consider switch from coumadin.  Not yet ready to make the leap.

## 2011-02-07 NOTE — Assessment & Plan Note (Signed)
Mild flair of chronic back pain.  No red flags

## 2011-02-08 NOTE — Telephone Encounter (Signed)
pateint came to appointment.

## 2011-02-08 NOTE — Progress Notes (Signed)
  Subjective:    Patient ID: Courtney Pena, female    DOB: 06-10-1940, 70 y.o.   MRN: [id number]  HPI Reviewed, agree and greatly appreciate Dr. Macky Lower management.  Will implement changes at next visit.      Review of Systems     Objective:   Physical Exam        Assessment & Plan:

## 2011-02-08 NOTE — Assessment & Plan Note (Signed)
70yoF with Stage 2 Hypertension not controlled on current antihypertensive therapy. 24-hour average blood pressure 177/90 mmHg, with non-dipping pattern. Current therapy includes Metoprolol, Diltiazem, hydrochlorothiazide, and amlodipine. Patient has allergy to ACE/ARB. Would recommend Spironolactone 25mg  daily and check BMET one week after initiation. Also would recommend switching amlodipine to evening dosing. Future considerations if needed may include switching beta blocker to carvedilol. Patient following up with Dr. Leveda Anna this Friday 02/09/11 at 8:45am.

## 2011-02-09 ENCOUNTER — Encounter: Payer: Self-pay | Admitting: Family Medicine

## 2011-02-09 ENCOUNTER — Ambulatory Visit (INDEPENDENT_AMBULATORY_CARE_PROVIDER_SITE_OTHER): Payer: 59 | Admitting: Family Medicine

## 2011-02-09 VITALS — BP 139/89 | HR 73 | Temp 97.7°F | Ht 66.0 in | Wt 275.0 lb

## 2011-02-09 DIAGNOSIS — E1165 Type 2 diabetes mellitus with hyperglycemia: Secondary | ICD-10-CM

## 2011-02-09 DIAGNOSIS — E118 Type 2 diabetes mellitus with unspecified complications: Secondary | ICD-10-CM

## 2011-02-09 DIAGNOSIS — I10 Essential (primary) hypertension: Secondary | ICD-10-CM

## 2011-02-09 MED ORDER — SPIRONOLACTONE 25 MG PO TABS: 25.0000 mg | ORAL_TABLET | Freq: Every day | ORAL | Status: DC

## 2011-02-09 NOTE — Assessment & Plan Note (Signed)
Nutrition referral.

## 2011-02-09 NOTE — Progress Notes (Signed)
  Subjective:    Patient ID: Courtney Pena, female    DOB: Jan 15, 1941, 70 y.o.   MRN: [id number]  HPI  Follow up uncontroled hypertension, DM and obesity.  Appreciate recommendations of Dr. Raymondo Band and will follow.  Also, she was impressed by the 24 hour BP graph and now is very serious about control.  Will take advantage of this moment and refer her to both our nutritionist and health coach.    Review of Systems Denies CP, SOB or syncope     Objective:   Physical Exam VS noted Lungs clear Cardiac RRR without m Ext 1+ edema        Assessment & Plan:

## 2011-02-09 NOTE — Assessment & Plan Note (Signed)
Poor control.  Add spironolactone. Recheck BMP next week.  Nutrition referral

## 2011-02-09 NOTE — Patient Instructions (Addendum)
Please take your amlodipine at night. I have sent in a new prescription for one more high blood pressure medicine.  We need to watch your potassium carefully.  Unlike the other meds which cause your potassium to fall, this one can make it go high. Please make sure that Kendal Hymen draws the ordered future blood work in addition to your PT when you come in on 12/20 I will ask the nurse to set you up with a nutrition referral for both HBP and DM My health coach, Arlys John, will contact you to see what she can add.

## 2011-02-12 ENCOUNTER — Telehealth: Payer: Self-pay | Admitting: Family Medicine

## 2011-02-12 NOTE — Telephone Encounter (Signed)
Will fwd. To Dr.Hensel for advise. Courtney Pena, Courtney Pena

## 2011-02-12 NOTE — Telephone Encounter (Signed)
Pt is having a reaction to Spironolactone - cramping in legs and fingers and cannot tolerate it.  Wants to know what she can do.

## 2011-02-13 NOTE — Telephone Encounter (Signed)
She has a history of being sensitive to medications.  Called and discussed.  Has a BMP planned for 2 days.  Keep that appointment.  Cut back to 1/2 tab of spironolactone daily and try to increase to one tab daily slowly.

## 2011-02-15 ENCOUNTER — Ambulatory Visit (INDEPENDENT_AMBULATORY_CARE_PROVIDER_SITE_OTHER): Payer: 59 | Admitting: *Deleted

## 2011-02-15 ENCOUNTER — Encounter: Payer: Self-pay | Admitting: Home Health Services

## 2011-02-15 ENCOUNTER — Ambulatory Visit (INDEPENDENT_AMBULATORY_CARE_PROVIDER_SITE_OTHER): Payer: 59 | Admitting: Home Health Services

## 2011-02-15 VITALS — BP 158/72 | HR 75 | Temp 97.8°F

## 2011-02-15 DIAGNOSIS — I4891 Unspecified atrial fibrillation: Secondary | ICD-10-CM

## 2011-02-15 DIAGNOSIS — I10 Essential (primary) hypertension: Secondary | ICD-10-CM

## 2011-02-15 DIAGNOSIS — IMO0002 Reserved for concepts with insufficient information to code with codable children: Secondary | ICD-10-CM

## 2011-02-15 DIAGNOSIS — Z7901 Long term (current) use of anticoagulants: Secondary | ICD-10-CM

## 2011-02-15 LAB — BASIC METABOLIC PANEL WITH GFR
BUN: 17 mg/dL (ref 6–23)
CO2: 25 mEq/L (ref 19–32)
Calcium: 9.5 mg/dL (ref 8.4–10.5)
Chloride: 101 mEq/L (ref 96–112)
Creat: 0.73 mg/dL (ref 0.50–1.10)
GFR, Est African American: >89 mL/min
GFR, Est Non African American: 84 mL/min
Glucose, Bld: 228 mg/dL — ABNORMAL HIGH (ref 70–99)
Potassium: 4.2 mEq/L (ref 3.5–5.3)
Sodium: 139 mEq/L (ref 135–145)

## 2011-02-15 LAB — POCT INR: INR: 2.4

## 2011-02-15 NOTE — Patient Instructions (Addendum)
1. Start working out at [employer] every Saturday. 2. Start monitoring how much sugar you eat every day. 3. Schedule follow up appointment with Paulino Rily. 4. Continue counseling with Rosalita Chessman through weekly telephone conversations. 5. Continue to take medications as prescribed and self-monitoring your blood pressure daily.

## 2011-02-15 NOTE — Progress Notes (Signed)
  Subjective:    Patient ID: Courtney Pena, female    DOB: January 08, 1941, 70 y.o.   MRN: [id number]  HPI Reviewed and agree.    Review of Systems     Objective:   Physical Exam        Assessment & Plan:

## 2011-02-15 NOTE — Progress Notes (Signed)
  Subjective:    Patient ID: Courtney Pena, female    DOB: 05/20/1940, 70 y.o.   MRN: [id number]  HPI  WEIGHT LOSS/DIABETES MANAGEMENT/HTN MANAGEMENT  Patient Identified Concern:  Controlling blood pressure, controlling diabetes, weight loss, exercising Stage of Change Patient Is In:  Contemplation- pt planning on making changes in next 6 months. Patient Reported Barriers:  Laziness, lack of motivation Patient Reported Perceived Benefits:  Better health, longevity , feeling better Patient Reports Self-Efficacy:   Pt displays some self-efficacy.  She has been successful in weight loss in the past.  Behavior Change Supports:  Husband, PCP Goals:  To go to [facility] every Saturday for exercise.  This includes preparing to go by getting gym clothes together.  Pt is also going to start monitoring daily sugar intake.  She will also schedule an appointment with PharmD to review types of herbal supplements to take for weight loss while on prescription medications.  Patient Education:  We discussed habits/behaviors that need to change for better health.  We dicussed low sodium/sugar diet for HTN/DM.   Pt agreed to weekly phone from [employer] for accountability.  Pt is highly motivated for behavioral changes but needs support to get her started.    Review of Systems     Objective:   Physical Exam        Assessment & Plan:

## 2011-02-15 NOTE — Assessment & Plan Note (Signed)
Coached on diet, exercise and med compliance.

## 2011-03-12 ENCOUNTER — Ambulatory Visit (INDEPENDENT_AMBULATORY_CARE_PROVIDER_SITE_OTHER)
Admission: RE | Admit: 2011-03-12 | Discharge: 2011-03-12 | Disposition: A | Discharge status: Home / Self Care | Payer: 59 | Source: Ambulatory Visit | Attending: Pulmonary Disease | Admitting: Pulmonary Disease

## 2011-03-12 ENCOUNTER — Telehealth: Payer: Self-pay | Admitting: Pulmonary Disease

## 2011-03-12 DIAGNOSIS — R911 Solitary pulmonary nodule: Secondary | ICD-10-CM

## 2011-03-12 DIAGNOSIS — J984 Other disorders of lung: Secondary | ICD-10-CM

## 2011-03-12 NOTE — Telephone Encounter (Signed)
Contacted patient. Patient wants to know if CT results are ready, please advise. Thanks

## 2011-03-13 ENCOUNTER — Other Ambulatory Visit: Payer: Self-pay | Admitting: Family Medicine

## 2011-03-13 DIAGNOSIS — Z1231 Encounter for screening mammogram for malignant neoplasm of breast: Secondary | ICD-10-CM

## 2011-03-15 ENCOUNTER — Telehealth: Payer: Self-pay | Admitting: Home Health Services

## 2011-03-15 ENCOUNTER — Ambulatory Visit: Payer: 59

## 2011-03-15 NOTE — Telephone Encounter (Signed)
Spoke with Courtney Pena  Pt reports taking medications daily without missing a day.  Pt reports blood pressure readings around 126/65.  Pt is self monitoring daily.  Pt reports going to the gym 1 time since our last encounter.  Pt is disappointed in her self but ready to start making changes.  Pt reports diet okay but is having a problem with eating too many sweets.    She craves them the most after lunch time.  Pt set goal to avoid sweets after lunch and/or replace the craving with a cold piece of fruit.  Pt agreed to follow up with health coach in one week to discuss how the change went.  Pt's overall goal is weight loss/exercise/htn management.

## 2011-03-15 NOTE — Telephone Encounter (Signed)
Pt is aware of CT results.  See addendum under her ct report from 03/12/2011

## 2011-03-16 ENCOUNTER — Ambulatory Visit: Payer: 59 | Admitting: Pharmacist

## 2011-03-16 ENCOUNTER — Ambulatory Visit: Payer: 59

## 2011-03-16 ENCOUNTER — Ambulatory Visit: Payer: 59 | Admitting: Family Medicine

## 2011-03-21 ENCOUNTER — Ambulatory Visit (INDEPENDENT_AMBULATORY_CARE_PROVIDER_SITE_OTHER): Payer: 59 | Admitting: *Deleted

## 2011-03-21 DIAGNOSIS — IMO0002 Reserved for concepts with insufficient information to code with codable children: Secondary | ICD-10-CM

## 2011-03-21 DIAGNOSIS — Z7901 Long term (current) use of anticoagulants: Secondary | ICD-10-CM

## 2011-03-21 DIAGNOSIS — I4891 Unspecified atrial fibrillation: Secondary | ICD-10-CM

## 2011-03-21 LAB — POCT INR: INR: 3.3

## 2011-03-30 ENCOUNTER — Ambulatory Visit (INDEPENDENT_AMBULATORY_CARE_PROVIDER_SITE_OTHER): Payer: 59 | Admitting: *Deleted

## 2011-03-30 ENCOUNTER — Ambulatory Visit (INDEPENDENT_AMBULATORY_CARE_PROVIDER_SITE_OTHER): Payer: 59 | Admitting: Family Medicine

## 2011-03-30 ENCOUNTER — Ambulatory Visit (INDEPENDENT_AMBULATORY_CARE_PROVIDER_SITE_OTHER): Payer: 59 | Admitting: Pharmacist

## 2011-03-30 ENCOUNTER — Encounter: Payer: Self-pay | Admitting: Family Medicine

## 2011-03-30 ENCOUNTER — Encounter: Payer: Self-pay | Admitting: Pharmacist

## 2011-03-30 ENCOUNTER — Ambulatory Visit: Payer: 59

## 2011-03-30 VITALS — BP 134/78 | Wt 268.4 lb

## 2011-03-30 DIAGNOSIS — E1165 Type 2 diabetes mellitus with hyperglycemia: Secondary | ICD-10-CM

## 2011-03-30 DIAGNOSIS — Z7901 Long term (current) use of anticoagulants: Secondary | ICD-10-CM

## 2011-03-30 DIAGNOSIS — I10 Essential (primary) hypertension: Secondary | ICD-10-CM

## 2011-03-30 DIAGNOSIS — E118 Type 2 diabetes mellitus with unspecified complications: Secondary | ICD-10-CM

## 2011-03-30 DIAGNOSIS — E669 Obesity, unspecified: Secondary | ICD-10-CM

## 2011-03-30 DIAGNOSIS — IMO0002 Reserved for concepts with insufficient information to code with codable children: Secondary | ICD-10-CM

## 2011-03-30 LAB — POCT INR: INR: 2.9

## 2011-03-30 MED ORDER — SPIRONOLACTONE 25 MG PO TABS: 25.0000 mg | ORAL_TABLET | Freq: Every day | ORAL | Status: DC

## 2011-03-30 MED ORDER — LIRAGLUTIDE 18 MG/3ML ~~LOC~~ SOLN: 0.6000 mg | Freq: Every day | SUBCUTANEOUS | Status: DC

## 2011-03-30 MED ORDER — DILTIAZEM HCL ER COATED BEADS 180 MG PO CP24: 180.0000 mg | ORAL_CAPSULE | Freq: Every day | ORAL | Status: DC

## 2011-03-30 MED ORDER — INSULIN PEN NEEDLE 31G X 8 MM MISC: 1.0000 | Freq: Every day | Status: DC

## 2011-03-30 NOTE — Assessment & Plan Note (Addendum)
Diabetes of many yrs duration currently under slightly improved control most likely related to weight loss of 7 pounds.  Control of blood glucose based on   Lab Results  Component Value Date   HGBA1C 8.9 02/02/2011    ,home fasting CBG readings of > 150 remains suboptimal due to dietary indiscretion and medication therapy.Denies hypoglycemic events.   Patient is NOW willing to attempt use of Victoza.  Following education patient was able to demonstrate adequate technique and verbalized tx plan.  WE reviewed potential adverse effects.  Written patient instructions provided.  Follow up in  Pharmacist Clinic Visit 1 month.  Total time in face to face counseling 40 minutes.  Patient seen with Benjaman Pott, Pharmacy Resident.

## 2011-03-30 NOTE — Progress Notes (Signed)
  Subjective:    Patient ID: George Ina, female    DOB: Sep 19, 1940, 71 y.o.   MRN: [id number]  HPI     OBESITY Current weight/BMI : Body mass index is 43.35 kg/(m^2).    How long have been obese:  10 + years Course:  Somewhat improving Problems or symptoms it causes:  DM, HTN   Things have tried to improve:  Diet modification  Patient Identified Concern:  Weight loss, dm management Stage of Change Patient Is In:  Preparation- been making changes for less than 6 months Patient Reported Barriers:  Enjoys eating sweet foods Patient Reported Perceived Benefits:  Looking good, feeling better, less medication dependence Patient Reports Self-Efficacy:   Pt displays some self-efficacy.  Has lost weight in the past Behavior Change Supports:  To be determined Goals:  Pt established goals of going to gym at least 1 x a week, continued mindfulness about moving more including walking, continue limiting sweets in daily diet, increasing F/V. Patient Education:  We discussed and celebrated recent 7lb wt loss.  We talked about eating small meals throughout the day vs skipping meals.  We talked about drinking only water and eliminating sodas/sweet drinks.  We talked about continuing to increase daily movement for wt loss.      Review of Systems     Objective:   Physical Exam        Assessment & Plan:

## 2011-03-30 NOTE — Progress Notes (Deleted)
  Subjective:    Patient ID: Courtney Pena, female    DOB: 02-Sep-1940, 71 y.o.   MRN: [id number]  HPI    OBESITY Current weight/BMI : Body mass index is 43.35 kg/(m^2).    How long have been obese:  *** Course:  *** Problems or symptoms it causes:  ***   Things have tried to improve:  ***   Patient Identified Concern:  *** Stage of Change Patient Is In:  *** Patient Reported Barriers:  *** Patient Reported Perceived Benefits:  *** Patient Reports Self-Efficacy:   *** Behavior Change Supports:  *** Goals:  *** Patient Education:  ***     Review of Systems     Objective:   Physical Exam        Assessment & Plan:

## 2011-03-30 NOTE — Assessment & Plan Note (Signed)
Agree with Dr. Raymondo Band, DC Alma Friendly and start liraglutide.

## 2011-03-30 NOTE — Progress Notes (Signed)
  Subjective:    Patient ID: Courtney Pena, female    DOB: [birth year], 71 y.o.   MRN: [id number]  HPI  Patient arrives in good spirits.   She reports working hard on losing weight but admits eating and craving sugar.  She reports CBGs of 90-to 200 overall.  She reports 90-150 in the evening with higher numbers 150-200 in the AM.  She did NOT have her blood glucose meter or log book at visit.   Denied hypoglycemia however reports frequent nocturia.       Review of Systems     Objective:   Physical Exam        Assessment & Plan:   Diabetes of many yrs duration currently under slightly improved control most likely related to weight loss of 7 pounds.  Control of blood glucose based on   Lab Results  Component Value Date   HGBA1C 8.9 02/02/2011    ,home fasting CBG readings of > 150 remains suboptimal due to dietary indiscretion and medication therapy.Denies hypoglycemic events.   Patient is NOW willing to attempt use of Victoza.  Following education patient was able to demonstrate adequate technique and verbalized tx plan.  WE reviewed potential adverse effects.  Written patient instructions provided.  Follow up in  Pharmacist Clinic Visit 1 month.  Total time in face to face counseling 40 minutes.  Patient seen with Benjaman Pott, Pharmacy Resident.

## 2011-03-30 NOTE — Progress Notes (Signed)
  Subjective:    Patient ID: Courtney Pena, female    DOB: 01-May-1940, 71 y.o.   MRN: [id number]  HPI  This was a multi disciplinary visit.  Agree with pharmacologist, Dr. Macky Lower suggestions for her DM.    She complains of leg cramps since beginning spironolactone. She cut her tongue about a week ago and it took a very long time to stop bleeding.  We discussed future management should this happen.  Will recheck PT/INR Please also see health coach Courtney Pena's discussion.     Review of Systems     Objective:   Physical Exam Lungs clear Cardiac RRR        Assessment & Plan:

## 2011-03-30 NOTE — Assessment & Plan Note (Signed)
Assessment: Somewhat improved, pt has had a 7 lb wt loss since past office visit.  Plan: Continued counseling with Health Coach.  Pt has begun exercise routine of going to gym on Saturdays along with increasing her daily movement with walking. Pt has also started eating small meals throughout the day vs meal skipping.  Pt is increasing her daily F/V intake and working on limiting sweets in her daily diet.

## 2011-03-30 NOTE — Patient Instructions (Signed)
Start liraglutide and stop [place] as you discussed with Dr. Raymondo Band See him in one month. See me in 6 weeks to two months. Keep up the good work on diet and exercise.   I will call with your lab results.

## 2011-03-31 LAB — BASIC METABOLIC PANEL WITH GFR
BUN: 27 mg/dL — ABNORMAL HIGH (ref 6–23)
CO2: 23 mEq/L (ref 19–32)
Calcium: 9.8 mg/dL (ref 8.4–10.5)
Chloride: 102 mEq/L (ref 96–112)
Creat: 0.87 mg/dL (ref 0.50–1.10)
GFR, Est African American: 78 mL/min
GFR, Est Non African American: 68 mL/min
Glucose, Bld: 166 mg/dL — ABNORMAL HIGH (ref 70–99)
Potassium: 4.6 mEq/L (ref 3.5–5.3)
Sodium: 137 mEq/L (ref 135–145)

## 2011-04-03 ENCOUNTER — Ambulatory Visit (INDEPENDENT_AMBULATORY_CARE_PROVIDER_SITE_OTHER): Payer: 59 | Admitting: Cardiovascular Disease

## 2011-04-03 ENCOUNTER — Encounter: Payer: Self-pay | Admitting: Cardiovascular Disease

## 2011-04-03 DIAGNOSIS — I10 Essential (primary) hypertension: Secondary | ICD-10-CM

## 2011-04-03 DIAGNOSIS — E7800 Pure hypercholesterolemia, unspecified: Secondary | ICD-10-CM

## 2011-04-03 DIAGNOSIS — I480 Paroxysmal atrial fibrillation: Secondary | ICD-10-CM

## 2011-04-03 DIAGNOSIS — E1165 Type 2 diabetes mellitus with hyperglycemia: Secondary | ICD-10-CM

## 2011-04-03 DIAGNOSIS — E118 Type 2 diabetes mellitus with unspecified complications: Secondary | ICD-10-CM

## 2011-04-03 DIAGNOSIS — I4891 Unspecified atrial fibrillation: Secondary | ICD-10-CM

## 2011-04-03 NOTE — Progress Notes (Signed)
Courtney Pena is seen today for F/U of dyspnea, palpitations, HTN and mild CAD. Cath in 2004 showed mild plaque and she had a nonischemic myovue in 2007. Last myovue 3/12 also normal with lexiscan and EF 64% Her BS is suboptimally controlled due to dietary indiscretion. Her A1c is above 7 again. However at one point she was above 11. She decreased her Toprol on her own to 50mg  as higher doses made her fatigued. She has had multiple side effects to ARB's and ACE inhibitors including angioedema. She continues to have occasional flip flops in her heart but no sustained arrythmia. She has exertional dyspnea that is functional due to her sedentary nature and obesity. Her BP is not adequately controlled. She had normal Ao and renals on cath in 2004 and I think her HTN is essential. She has had a lot of stess lately with her husband being diagnosed with gastric cancer. No syncope,pleurisy,or diaphoresis  Hospitalized 2012  with PAF. Started on coumadin. Monitors INR at [facility] practice. No evidence of recurrence. Normal EF by echo in hospital  ROS: Denies fever, malais, weight loss, blurry vision, decreased visual acuity, cough, sputum, SOB, hemoptysis, pleuritic pain, palpitaitons, heartburn, abdominal pain, melena, lower extremity edema, claudication, or rash.  All other systems reviewed and negative  General: Affect appropriate Obese black female HEENT: normal Neck supple with no adenopathy JVP normal no bruits no thyromegaly Lungs clear with no wheezing and good diaphragmatic motion Heart:  S1/S2 no murmur, no rub, gallop or click PMI normal Abdomen: benighn, BS positve, no tenderness, no AAA no bruit.  No HSM or HJR Distal pulses intact with no bruits No edema Neuro non-focal Skin warm and dry No muscular weakness   Current Outpatient Prescriptions  Medication Sig Dispense Refill  . acetaminophen (TYLENOL) 500 MG tablet Take 500-1,000 mg by mouth as needed. As needed for headaches or arthritis       . amLODipine (NORVASC) 10 MG tablet Take 10 mg by mouth daily. (at bedtime)      . aspirin 81 MG tablet Take 81 mg by mouth daily. (at bedtime)      . Cholecalciferol (VITAMIN D3) 1000 UNITS CAPS Take 1 capsule by mouth daily.       Marland Kitchen DICLOFENAC POTASSIUM PO Take 1 tablet by mouth as needed. Take only for severe arthritis pain. Max use 1-2 times per month.       . diltiazem (CARDIZEM CD) 180 MG 24 hr capsule Take 1 capsule (180 mg total) by mouth daily.  90 capsule  3  . GLIPIZIDE XL 10 MG 24 hr tablet TAKE 1 TABLET ONCE DAILY  90 each  3  . glucose blood test strip Check blood sugars two times a day       . hydrochlorothiazide 25 MG tablet TAKE 1 TABLET ONCE DAILY  90 tablet  2  . Insulin Pen Needle 31G X 8 MM MISC 1 Device by Does not apply route daily. Supply QS for one month supply of daily injections  1 each  11  . iron polysaccharides (NIFEREX) 150 MG capsule Take 150 mg by mouth daily.        Marland Kitchen JANUVIA 100 MG tablet TAKE 1 TABLET ONCE DAILY  90 tablet  2  . levothyroxine (SYNTHROID, LEVOTHROID) 125 MCG tablet Take 125 mcg by mouth daily.        . Liraglutide 18 MG/3ML SOLN Inject 0.1-0.2 mLs (0.6-1.2 mg total) into the skin daily before lunch. 0.6mg  daily for one week  then increase dose to 1.2mg  daily.  2 mL  11  . metFORMIN (GLUCOPHAGE) 1000 MG tablet TAKE ONE TABLET BY MOUTH TWICE DAILY WITH MEALS  180 tablet  3  . metoprolol (TOPROL XL) 100 MG 24 hr tablet Take 1 tablet (100 mg total) by mouth daily.  90 tablet  3  . omeprazole (PRILOSEC) 40 MG capsule Take 40 mg by mouth as needed. For stomach       . propranolol (INDERAL) 10 MG tablet Take 10 mg by mouth as needed. For flutter or rapid heart beating      . spironolactone (ALDACTONE) 25 MG tablet Take 1 tablet (25 mg total) by mouth daily.  90 tablet  3  . warfarin (COUMADIN) 5 MG tablet Take 5 mg by mouth. Take as directed      . DISCONTD: iron polysaccharides (FERREX 150) 150 MG capsule Take 1 capsule (150 mg total) by mouth 2  (two) times daily.  60 capsule  11  . DISCONTD: omeprazole (PRILOSEC) 40 MG capsule Take 1 capsule (40 mg total) by mouth daily. For stomach  90 capsule  3    Allergies  Penicillins; Sulfamethoxazole; Lisinopril; Losartan potassium; and Pioglitazone  Electrocardiogram:  Assessment and Plan

## 2011-04-03 NOTE — Assessment & Plan Note (Signed)
Maint NSR  Continue coumadin  INR checked at [facility]

## 2011-04-03 NOTE — Assessment & Plan Note (Signed)
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  Suspect she will be on insulin in the next two years

## 2011-04-03 NOTE — Assessment & Plan Note (Signed)
Cholesterol is at goal.  Continue current dose of statin and diet Rx.  No myalgias or side effects.  F/U  LFT's in 6 months. Lab Results  Component Value Date   LDLCALC 111* 12/08/2010             

## 2011-04-03 NOTE — Assessment & Plan Note (Signed)
Well controlled.  Continue current medications and low sodium Dash type diet.    

## 2011-04-03 NOTE — Patient Instructions (Signed)
Your physician wants you to follow-up in:   12MONTHS WITH  DR NISHAN You will receive a reminder letter in the mail two months in advance. If you don't receive a letter, please call our office to schedule the follow-up appointment. Your physician recommends that you continue on your current medications as directed. Please refer to the Current Medication list given to you today.  

## 2011-04-04 ENCOUNTER — Ambulatory Visit: Payer: 59

## 2011-04-06 NOTE — Patient Instructions (Signed)
See multi-disciplinary visit with Arlys John and Dr. Leveda Anna.

## 2011-04-13 ENCOUNTER — Ambulatory Visit: Payer: 59

## 2011-04-13 ENCOUNTER — Ambulatory Visit (INDEPENDENT_AMBULATORY_CARE_PROVIDER_SITE_OTHER): Payer: 59 | Admitting: *Deleted

## 2011-04-13 ENCOUNTER — Telehealth: Payer: Self-pay | Admitting: Home Health Services

## 2011-04-13 DIAGNOSIS — IMO0002 Reserved for concepts with insufficient information to code with codable children: Secondary | ICD-10-CM

## 2011-04-13 DIAGNOSIS — Z7901 Long term (current) use of anticoagulants: Secondary | ICD-10-CM

## 2011-04-13 LAB — POCT INR: INR: 1.7

## 2011-04-13 NOTE — Telephone Encounter (Signed)
Pt was in office for warfarin check and I spoke with her briefly.  Pt reports doing okay with victoza.  She finds she needs to eat something before administering shot to avoid nausea.   Pt wt her self at 269 lbs today.  Which indicates she is maintaining recent 7lb wt loss.  Pt reports not craving sweets as much, most likely due to new medication.  Pt reports not going to gym and will focus on going to gym this weekend as a goal.  Pt's overall goal is wt loss and dm/htn management.

## 2011-04-30 ENCOUNTER — Ambulatory Visit (INDEPENDENT_AMBULATORY_CARE_PROVIDER_SITE_OTHER): Payer: 59 | Admitting: *Deleted

## 2011-04-30 DIAGNOSIS — Z7901 Long term (current) use of anticoagulants: Secondary | ICD-10-CM

## 2011-04-30 DIAGNOSIS — IMO0002 Reserved for concepts with insufficient information to code with codable children: Secondary | ICD-10-CM

## 2011-04-30 LAB — POCT INR: INR: 3.4

## 2011-05-02 ENCOUNTER — Ambulatory Visit: Payer: 59

## 2011-05-02 ENCOUNTER — Encounter: Payer: Self-pay | Admitting: Family Medicine

## 2011-05-02 NOTE — Telephone Encounter (Signed)
Patient is calling

## 2011-05-02 NOTE — Telephone Encounter (Signed)
This encounter was created in error - please disregard.

## 2011-05-04 ENCOUNTER — Encounter: Payer: Self-pay | Admitting: Pharmacist

## 2011-05-04 ENCOUNTER — Ambulatory Visit (INDEPENDENT_AMBULATORY_CARE_PROVIDER_SITE_OTHER): Payer: 59 | Admitting: Pharmacist

## 2011-05-04 ENCOUNTER — Ambulatory Visit (INDEPENDENT_AMBULATORY_CARE_PROVIDER_SITE_OTHER): Payer: 59 | Admitting: *Deleted

## 2011-05-04 DIAGNOSIS — E1165 Type 2 diabetes mellitus with hyperglycemia: Secondary | ICD-10-CM

## 2011-05-04 DIAGNOSIS — Z7901 Long term (current) use of anticoagulants: Secondary | ICD-10-CM

## 2011-05-04 DIAGNOSIS — IMO0002 Reserved for concepts with insufficient information to code with codable children: Secondary | ICD-10-CM

## 2011-05-04 DIAGNOSIS — I10 Essential (primary) hypertension: Secondary | ICD-10-CM

## 2011-05-04 DIAGNOSIS — E118 Type 2 diabetes mellitus with unspecified complications: Secondary | ICD-10-CM

## 2011-05-04 LAB — POCT INR: INR: 3.2

## 2011-05-04 NOTE — Assessment & Plan Note (Signed)
Hypertension: Patient's blood pressure is better controlled at this visit, likely do to her weight loss. She will increase her exercise as noted above, and we will follow-up her blood pressure at that point. Medications remain the same and consist of amlodipine 10 mg daily, diltiazem CD 180 mg daily, hydrochlorothiazide 25 mg daily, metoprolol succinate 100 mg daily, and spironolactone 25 mg daily.   Follow up in  Pharmacist Clinic Visit April 5th at 9:00 AM.   Total time in face to face counseling 35 minutes.  Patient seen with Birdena Crandall, PharmD Candidate and [place] Smith, Pharmacy Resident.

## 2011-05-04 NOTE — Assessment & Plan Note (Signed)
Atrial fibrillation: Patient has been on warfarin since October of 2012 for atrial fibrillation. Today her INR is 3.2. She will start taking 5 mg of warfarin daily and repeat INR 3/18. Goal is to start rivaroxaban for the patient in an effort to reduce clinic visits.  Follow up in  Pharmacist Clinic Visit April 5th at 9:00 AM.   Total time in face to face counseling 35 minutes.  Patient seen with Birdena Crandall, PharmD Candidate and [place] Smith, Pharmacy Resident.

## 2011-05-04 NOTE — Progress Notes (Signed)
S. Patient is pleasant and doing well this morning. She reports adherence to her medications and no issues obtaining them. She keeps a medicine book handy, although she forgot it today. She reports one episode of hypoglycemia on her current diabetes medications of glipizide, metformin, and liraglutide. She reports not eating as close to her doses of these medications as usual. She reports more exercise and a better diet since her last visit, partly due to the effects of the liraglutide. She reports eating meals later in the evening than advised with concomitant elevated blood sugars every morning (~150). O. Patient's diabetic medications include glipizide XL 10 mg daily, liraglutide 1.2 mg daily with lunch (increased from 0.6 mg daily), and metformin 1000 mg twice daily with meals. Her last A1c was elevated 8.9 on her December visit, and she has no log of daily blood sugars today, although she does keep one on her meter at home. HTN control is better on amlodipine 10 mg daily, diltiazem CD 180 mg daily, hydrochlorothiazide 25 mg daily, metoprolol succinate 100 mg daily, and spironolactone 25 mg daily. Patient is on warfarin for chronic atrial fibrillation. Today the option was given to stop warfarin and start rivaroxaban in an effort to reduce her office visits. The benefits and possible risks were explained.  Diabetes of many yrs duration currently under improved control of blood glucose based on  . Lab Results  Component Value Date   HGBA1C 8.9 02/02/2011  1. Diabetes Mellitus: Patient did not bring her meter in today, but she reports more sugars in the 100 range than the 200 range, likely lowering her A1c below 8. Patient had one episode of hypoglycemia recently on a day she took her medications further away from a meal than usual.  Able to verbalize appropriate hypoglycemia management plan. Made plans to take glipizide XL with her evening meal in an effort to control her AM blood sugars. Written patient  instructions provided.  Made plans to improve exercise plan today, with a goal of three nights weekly at the [facility] with at least 30 minutes on the exercise bicycle. Will follow-up with patient on success of exercise plan at next visit.  2. Atrial fibrillation: Patient has been on warfarin since October of 2012 for atrial fibrillation. Today her INR is 3.2. She will start taking 5 mg of warfarin daily and repeat INR 3/18. Goal is to start rivaroxaban for the patient in an effort to reduce clinic visits.  3. Hypertension: Patient's blood pressure is better controlled at this visit, likely do to her weight loss. She will increase her exercise as noted above, and we will follow-up her blood pressure at that point. Medications remain the same and consist of amlodipine 10 mg daily, diltiazem CD 180 mg daily, hydrochlorothiazide 25 mg daily, metoprolol succinate 100 mg daily, and spironolactone 25 mg daily.   Follow up in  Pharmacist Clinic Visit April 5th at 9:00 AM.   Total time in face to face counseling 35 minutes.  Patient seen with Birdena Crandall, PharmD Candidate and [place] Smith, Pharmacy Resident.

## 2011-05-04 NOTE — Assessment & Plan Note (Signed)
Diabetes of many yrs duration currently under improved control of blood glucose based on  . Lab Results  Component Value Date   HGBA1C 8.9 02/02/2011  1. Diabetes Mellitus: Patient did not bring her meter in today, but she reports more sugars in the 100 range than the 200 range, likely lowering her A1c below 8. Patient had one episode of hypoglycemia recently on a day she took her medications further away from a meal than usual.  Able to verbalize appropriate hypoglycemia management plan. Made plans to take glipizide XL with her evening meal in an effort to control her AM blood sugars. Written patient instructions provided. She will continue her liraglutide 1.2 mg daily and metformin 1000 mg twice daily with meals.  Made plans to improve exercise plan today, with a goal of three nights weekly at the [facility] with at least 30 minutes on the exercise bicycle. Will follow-up with patient on success of exercise plan at next visit.    Follow up in  Pharmacist Clinic Visit April 5th at 9:00 AM.   Total time in face to face counseling 35 minutes.  Patient seen with Birdena Crandall, PharmD Candidate and [place] Smith, Pharmacy Resident.

## 2011-05-04 NOTE — Progress Notes (Signed)
  Subjective:    Patient ID: Courtney Pena, female    DOB: [birth year], 71 y.o.   MRN: [id number]  HPI Reviewed agree with Dr. Macky Lower management.    Review of Systems     Objective:   Physical Exam        Assessment & Plan:

## 2011-05-04 NOTE — Patient Instructions (Addendum)
1. Continue to do well with your medication adherence. 2. Start taking your glipizide XL 10 mg with your evening meal in an attempt to control your AM blood sugars. 3. Attempt to eat a morning snack (apple, anything simple) to get a better balance of daily blood sugar and avoid hypoglycemia. 4. Reduce the size of your evening meals, and attempt to eat them further out from going to bed. 5. Make it to the [facility] three times weekly and ride the exercise bicycle for 30 minutes. 6. Take warfarin 5 mg daily until your next INR check on March 18th. We will re-check your INR, and at that point possibly start RIVAROXABAN (XARELTO). Please come with any questions you may have. 7. Follow-up with Dr. Raymondo Band in one month on Friday, April 5th at 9:00 AM.

## 2011-05-04 NOTE — Progress Notes (Signed)
S. Patient is pleasant and doing well this morning. She reports adherence to her medications and no issues obtaining them. She keeps a medicine book handy, although she forgot it today. She reports one episode of hypoglycemia on her current diabetes medications of glipizide, metformin, and liraglutide. She reports not eating as close to her doses of these medications as usual. She reports more exercise and a better diet since her last visit, partly due to the effects of the liraglutide. She reports eating meals later in the evening than advised with concomitant elevated blood sugars every morning (~150). O. Patient's diabetic medications include glipizide XL 10 mg daily, liraglutide 1.2 mg daily with lunch (increased from 0.6 mg daily), and metformin 1000 mg twice daily with meals. Her last A1c was elevated 8.9 on her December visit, and she has no log of daily blood sugars today, although she does keep one on her meter at home. HTN control is better on amlodipine 10 mg daily, diltiazem CD 180 mg daily, hydrochlorothiazide 25 mg daily, metoprolol succinate 100 mg daily, and spironolactone 25 mg daily. Patient is on warfarin for chronic atrial fibrillation. Today the option was given to stop warfarin and start rivaroxaban in an effort to reduce her office visits. The benefits and possible risks were explained.  Diabetes of many yrs duration currently under improved control of blood glucose based on  . Lab Results  Component Value Date   HGBA1C 8.9 02/02/2011  1. Diabetes Mellitus: Patient did not bring her meter in today, but she reports more sugars in the 100 range than the 200 range, likely lowering her A1c below 8. Patient had one episode of hypoglycemia recently on a day she took her medications further away from a meal than usual.  Able to verbalize appropriate hypoglycemia management plan. Made plans to take glipizide XL with her evening meal in an effort to control her AM blood sugars. Written patient  instructions provided.  Made plans to improve exercise plan today, with a goal of three nights weekly at the [employer] with at least 30 minutes on the exercise bicycle. Will follow-up with patient on success of exercise plan at next visit.  2. Atrial fibrillation: Patient has been on warfarin since October of 2012 for atrial fibrillation. Today her INR is 3.2. She will start taking 5 mg of warfarin daily and repeat INR 3/18. Goal is to start rivaroxaban for the patient in an effort to reduce clinic visits.  3. Hypertension: Patient's blood pressure is better controlled at this visit, likely do to her weight loss. She will increase her exercise as noted above, and we will follow-up her blood pressure at that point. Medications remain the same and consist of amlodipine 10 mg daily, diltiazem CD 180 mg daily, hydrochlorothiazide 25 mg daily, metoprolol succinate 100 mg daily, and spironolactone 25 mg daily.   Follow up in  Pharmacist Clinic Visit April 5th at 9:00 AM.   Total time in face to face counseling 35 minutes.  Patient seen with Abbey Church, PharmD Candidate and Jordan Smith, Pharmacy Resident.       

## 2011-05-14 ENCOUNTER — Ambulatory Visit: Payer: 59

## 2011-05-16 ENCOUNTER — Telehealth: Payer: Self-pay | Admitting: Pharmacist

## 2011-05-16 ENCOUNTER — Ambulatory Visit (INDEPENDENT_AMBULATORY_CARE_PROVIDER_SITE_OTHER): Payer: 59 | Admitting: *Deleted

## 2011-05-16 ENCOUNTER — Other Ambulatory Visit: Payer: Self-pay | Admitting: Family Medicine

## 2011-05-16 DIAGNOSIS — IMO0002 Reserved for concepts with insufficient information to code with codable children: Secondary | ICD-10-CM

## 2011-05-16 DIAGNOSIS — I4891 Unspecified atrial fibrillation: Secondary | ICD-10-CM

## 2011-05-16 DIAGNOSIS — Z7901 Long term (current) use of anticoagulants: Secondary | ICD-10-CM

## 2011-05-16 LAB — POCT INR: INR: 1.7

## 2011-05-16 MED ORDER — RIVAROXABAN 20 MG PO TABS: 20.0000 mg | ORAL_TABLET | Freq: Every day | ORAL | Status: DC

## 2011-05-16 NOTE — Assessment & Plan Note (Signed)
Will switch off coumadin to xarelto

## 2011-05-17 ENCOUNTER — Ambulatory Visit: Payer: 59

## 2011-05-18 NOTE — Telephone Encounter (Signed)
Clarified dose and use.  Patient will bring questions to next visit.

## 2011-05-29 ENCOUNTER — Telehealth: Payer: Self-pay | Admitting: Pharmacist

## 2011-05-29 NOTE — Telephone Encounter (Signed)
Patient would like to speak to Dr. Raymondo Band about combining some medications.

## 2011-05-29 NOTE — Telephone Encounter (Signed)
Patient is calling back again.

## 2011-05-31 ENCOUNTER — Ambulatory Visit: Payer: 59

## 2011-06-01 ENCOUNTER — Ambulatory Visit: Payer: 59 | Admitting: Pharmacist

## 2011-06-04 NOTE — Telephone Encounter (Signed)
Patient had 2 questions.  I advised continuting  Aspirin at the 81 mg dose with the Xarelto at this time.   I also advised against routine use of NSAIDS but advised PRN rare use may be appropriate.   Both of these are subject to future discussion.   Follow up in office on 4/19.

## 2011-06-06 ENCOUNTER — Ambulatory Visit
Admission: RE | Admit: 2011-06-06 | Discharge: 2011-06-06 | Disposition: A | Discharge status: Home / Self Care | Payer: 59 | Source: Ambulatory Visit | Attending: Family Medicine | Admitting: Family Medicine

## 2011-06-06 DIAGNOSIS — Z1231 Encounter for screening mammogram for malignant neoplasm of breast: Secondary | ICD-10-CM

## 2011-06-15 ENCOUNTER — Ambulatory Visit: Payer: 59 | Admitting: Pharmacist

## 2011-06-15 ENCOUNTER — Encounter: Payer: Self-pay | Admitting: Pharmacist

## 2011-06-15 ENCOUNTER — Ambulatory Visit (INDEPENDENT_AMBULATORY_CARE_PROVIDER_SITE_OTHER): Payer: 59 | Admitting: Pharmacist

## 2011-06-15 VITALS — BP 132/79 | HR 76 | Ht 64.76 in | Wt 264.0 lb

## 2011-06-15 DIAGNOSIS — E118 Type 2 diabetes mellitus with unspecified complications: Secondary | ICD-10-CM

## 2011-06-15 DIAGNOSIS — I10 Essential (primary) hypertension: Secondary | ICD-10-CM

## 2011-06-15 DIAGNOSIS — IMO0002 Reserved for concepts with insufficient information to code with codable children: Secondary | ICD-10-CM

## 2011-06-15 DIAGNOSIS — E1165 Type 2 diabetes mellitus with hyperglycemia: Secondary | ICD-10-CM

## 2011-06-15 LAB — POCT GLYCOSYLATED HEMOGLOBIN (HGB A1C): Hemoglobin A1C: 8.2

## 2011-06-15 MED ORDER — LIRAGLUTIDE 18 MG/3ML ~~LOC~~ SOLN: 1.2000 mg | Freq: Every day | SUBCUTANEOUS | Status: DC

## 2011-06-15 MED ORDER — METOPROLOL SUCCINATE ER 100 MG PO TB24: 100.0000 mg | ORAL_TABLET | Freq: Every day | ORAL | Status: DC

## 2011-06-15 MED ORDER — RIVAROXABAN 20 MG PO TABS: 20.0000 mg | ORAL_TABLET | Freq: Every day | ORAL | Status: DC

## 2011-06-15 NOTE — Progress Notes (Signed)
  Subjective:    Patient ID: Courtney Pena, female    DOB: Sep 07, 1940, 71 y.o.   MRN: [id number]  HPI  Patient arrives in good spirits.   Patient denies any bleeding or bruising with use of rivaroxaban.   Patient reports losing blood glucose meter on her trip [place].    She reports her blood sugar readings have been improved slightly with the use of Vicotza, glucotrol and metformin.  She reports multiple readings below 100 and one episode of low blood sugar ~70 (managed this appropriately).   However, readings have been around 200 throughout the day.    She notices the low readings primarily late in the afternoon when she has NOT eaten enough for the day.    Review of Systems     Objective:   Physical Exam  Filed Vitals:   06/15/11 1112  BP: 132/79  Pulse: 76        Assessment & Plan:   Diabetes of many yrs duration currently under improved control of blood glucose based on  . Lab Results  Component Value Date   HGBA1C 8.2 06/15/2011     , and reported home fasting CBG readings of ~180-220 and random CBG readings of 140-200. Control is suboptimal due to dietary indiscretion of carbohydrate intake. Reports one hypoglycemic event managed appropriately.  Able to verbalize appropriate hypoglycemia management plan.  Continue Vicotza and Metformin at this time. D/C glipizide and encouraged weight loss an exercise as main management plan.  Weight loss goal prior to next visit is 250.    Written patient instructions provided.  Follow up in  Pharmacist Clinic Visit after next visit with Dr. Leveda Anna.    Total time in face to face counseling 35 minutes.  Patient seen with Tana Conch, PharmD, Pharmacy Resident.  Blood pressure improved since previous readings.  Appears to be adherent to therapy.   Anticoagulation - appears to be tolerating Xarelto.  Continue to follow.

## 2011-06-15 NOTE — Patient Instructions (Signed)
Great to see you again today! 1. We are making one change: Stop taking glipizide. 2. Continue taking Victoza, Metformin.  3. Goal weight 250lbs!  Follow up with Dr. Leveda Anna.

## 2011-06-15 NOTE — Assessment & Plan Note (Signed)
Diabetes of many yrs duration currently under improved control of blood glucose based on  . Lab Results  Component Value Date   HGBA1C 8.2 06/15/2011     , and reported home fasting CBG readings of ~180-220 and random CBG readings of 140-200. Control is suboptimal due to dietary indiscretion of carbohydrate intake. Reports one hypoglycemic event managed appropriately.  Able to verbalize appropriate hypoglycemia management plan.  Continue Vicotza and Metformin at this time. D/C glipizide and encouraged weight loss an exercise as main management plan.  Weight loss goal prior to next visit is 250.    Written patient instructions provided.  Follow up in  Pharmacist Clinic Visit after next visit with Dr. Leveda Anna.    Total time in face to face counseling 35 minutes.  Patient seen with Tana Conch, PharmD, Pharmacy Resident.

## 2011-06-15 NOTE — Progress Notes (Signed)
  Subjective:    Patient ID: Courtney Pena, female    DOB: [birth year], 71 y.o.   MRN: [id number]  HPI  Reviewed and agree with Dr. Macky Lower management.    Review of Systems     Objective:   Physical Exam        Assessment & Plan:

## 2011-06-25 ENCOUNTER — Other Ambulatory Visit: Payer: Self-pay | Admitting: Family Medicine

## 2011-06-25 DIAGNOSIS — E039 Hypothyroidism, unspecified: Secondary | ICD-10-CM

## 2011-06-25 MED ORDER — LEVOTHYROXINE SODIUM 125 MCG PO TABS: 125.0000 ug | ORAL_TABLET | Freq: Every day | ORAL | Status: DC

## 2011-06-25 NOTE — Assessment & Plan Note (Signed)
[facility]

## 2011-06-27 ENCOUNTER — Telehealth: Payer: Self-pay | Admitting: Family Medicine

## 2011-06-27 NOTE — Telephone Encounter (Signed)
Having a problem with her meds and needs to speak with Dr Raymondo Band -  Re: glipizide

## 2011-06-28 ENCOUNTER — Telehealth: Payer: Self-pay | Admitting: Family Medicine

## 2011-06-28 DIAGNOSIS — E1165 Type 2 diabetes mellitus with hyperglycemia: Secondary | ICD-10-CM

## 2011-06-28 DIAGNOSIS — E118 Type 2 diabetes mellitus with unspecified complications: Secondary | ICD-10-CM

## 2011-06-28 MED ORDER — GLIPIZIDE ER 10 MG PO TB24: 10.0000 mg | ORAL_TABLET | Freq: Every day | ORAL | Status: DC

## 2011-06-28 NOTE — Assessment & Plan Note (Signed)
Restart gliizide

## 2011-06-28 NOTE — Telephone Encounter (Signed)
Patient was recently taken off her glipizide by Dr. Raymondo Band.  Glucose levels have been elevated between 320 and 340 in the am.  Want to go back to taking medicine and need to have refill sent to [facility] in [place].   Please give patient a call to let her know if this is ok to restart her med again.

## 2011-07-05 NOTE — Telephone Encounter (Signed)
Follow up Phone Call - patient has restarted glipizide.   Will continue this until next follow up with Dr. Leveda Anna.   Encouraged exercise / activity.  Patient asked if Celebrex was OK.Marland Kitchenadvised OK for short-term and limited use similar to previous diclofenac use.

## 2011-07-24 ENCOUNTER — Other Ambulatory Visit: Payer: Self-pay | Admitting: Family Medicine

## 2011-07-29 ENCOUNTER — Other Ambulatory Visit: Payer: Self-pay | Admitting: Family Medicine

## 2011-08-12 ENCOUNTER — Inpatient Hospital Stay (HOSPITAL_COMMUNITY)
Admission: EM | Admit: 2011-08-12 | Discharge: 2011-08-14 | DRG: 378 | Disposition: A | Discharge status: Home / Self Care | Payer: 59 | Attending: Family Medicine | Admitting: Family Medicine

## 2011-08-12 ENCOUNTER — Encounter (HOSPITAL_COMMUNITY): Payer: Self-pay | Admitting: Emergency Medicine

## 2011-08-12 ENCOUNTER — Emergency Department (HOSPITAL_COMMUNITY): Payer: 59

## 2011-08-12 DIAGNOSIS — K922 Gastrointestinal hemorrhage, unspecified: Secondary | ICD-10-CM | POA: Diagnosis present

## 2011-08-12 DIAGNOSIS — Z79899 Other long term (current) drug therapy: Secondary | ICD-10-CM

## 2011-08-12 DIAGNOSIS — E118 Type 2 diabetes mellitus with unspecified complications: Secondary | ICD-10-CM

## 2011-08-12 DIAGNOSIS — M129 Arthropathy, unspecified: Secondary | ICD-10-CM | POA: Diagnosis present

## 2011-08-12 DIAGNOSIS — E785 Hyperlipidemia, unspecified: Secondary | ICD-10-CM | POA: Diagnosis present

## 2011-08-12 DIAGNOSIS — K449 Diaphragmatic hernia without obstruction or gangrene: Secondary | ICD-10-CM | POA: Diagnosis present

## 2011-08-12 DIAGNOSIS — N179 Acute kidney failure, unspecified: Secondary | ICD-10-CM | POA: Diagnosis present

## 2011-08-12 DIAGNOSIS — G4730 Sleep apnea, unspecified: Secondary | ICD-10-CM | POA: Diagnosis present

## 2011-08-12 DIAGNOSIS — Z860100 Personal history of colon polyps, unspecified: Secondary | ICD-10-CM

## 2011-08-12 DIAGNOSIS — J45909 Unspecified asthma, uncomplicated: Secondary | ICD-10-CM | POA: Diagnosis present

## 2011-08-12 DIAGNOSIS — E1165 Type 2 diabetes mellitus with hyperglycemia: Secondary | ICD-10-CM

## 2011-08-12 DIAGNOSIS — E669 Obesity, unspecified: Secondary | ICD-10-CM | POA: Diagnosis present

## 2011-08-12 DIAGNOSIS — K31819 Angiodysplasia of stomach and duodenum without bleeding: Secondary | ICD-10-CM

## 2011-08-12 DIAGNOSIS — I10 Essential (primary) hypertension: Secondary | ICD-10-CM | POA: Diagnosis present

## 2011-08-12 DIAGNOSIS — K219 Gastro-esophageal reflux disease without esophagitis: Secondary | ICD-10-CM | POA: Diagnosis present

## 2011-08-12 DIAGNOSIS — K31811 Angiodysplasia of stomach and duodenum with bleeding: Principal | ICD-10-CM | POA: Diagnosis present

## 2011-08-12 DIAGNOSIS — K644 Residual hemorrhoidal skin tags: Secondary | ICD-10-CM | POA: Diagnosis present

## 2011-08-12 DIAGNOSIS — D500 Iron deficiency anemia secondary to blood loss (chronic): Secondary | ICD-10-CM

## 2011-08-12 DIAGNOSIS — E119 Type 2 diabetes mellitus without complications: Secondary | ICD-10-CM | POA: Diagnosis present

## 2011-08-12 DIAGNOSIS — Z6841 Body Mass Index (BMI) 40.0 and over, adult: Secondary | ICD-10-CM

## 2011-08-12 DIAGNOSIS — Z7982 Long term (current) use of aspirin: Secondary | ICD-10-CM

## 2011-08-12 DIAGNOSIS — IMO0002 Reserved for concepts with insufficient information to code with codable children: Secondary | ICD-10-CM

## 2011-08-12 DIAGNOSIS — E039 Hypothyroidism, unspecified: Secondary | ICD-10-CM | POA: Diagnosis present

## 2011-08-12 DIAGNOSIS — I4891 Unspecified atrial fibrillation: Secondary | ICD-10-CM | POA: Diagnosis present

## 2011-08-12 HISTORY — DX: Iron deficiency anemia secondary to blood loss (chronic): D50.0

## 2011-08-12 HISTORY — DX: Angiodysplasia of stomach and duodenum without bleeding: K31.819

## 2011-08-12 LAB — CBC
HCT: 25.6 % — ABNORMAL LOW (ref 36.0–46.0)
HCT: 23.9 % — ABNORMAL LOW (ref 36.0–46.0)
HCT: 25.1 % — ABNORMAL LOW (ref 36.0–46.0)
Hemoglobin: 8.2 g/dL — ABNORMAL LOW (ref 12.0–15.0)
Hemoglobin: 7.6 g/dL — ABNORMAL LOW (ref 12.0–15.0)
Hemoglobin: 8.1 g/dL — ABNORMAL LOW (ref 12.0–15.0)
MCH: 27.1 pg (ref 26.0–34.0)
MCH: 26.7 pg (ref 26.0–34.0)
MCH: 26.9 pg (ref 26.0–34.0)
MCHC: 32 g/dL (ref 30.0–36.0)
MCHC: 31.8 g/dL (ref 30.0–36.0)
MCHC: 32.3 g/dL (ref 30.0–36.0)
MCV: 84.5 fL (ref 78.0–100.0)
MCV: 83.9 fL (ref 78.0–100.0)
MCV: 83.4 fL (ref 78.0–100.0)
Platelets: 270 10*3/uL (ref 150–400)
Platelets: 240 10*3/uL (ref 150–400)
Platelets: 235 10*3/uL (ref 150–400)
RBC: 3.03 MIL/uL — ABNORMAL LOW (ref 3.87–5.11)
RBC: 2.85 MIL/uL — ABNORMAL LOW (ref 3.87–5.11)
RBC: 3.01 MIL/uL — ABNORMAL LOW (ref 3.87–5.11)
RDW: 17.4 % — ABNORMAL HIGH (ref 11.5–15.5)
RDW: 17.6 % — ABNORMAL HIGH (ref 11.5–15.5)
RDW: 17 % — ABNORMAL HIGH (ref 11.5–15.5)
WBC: 11.6 10*3/uL — ABNORMAL HIGH (ref 4.0–10.5)
WBC: 9 10*3/uL (ref 4.0–10.5)
WBC: 9.1 10*3/uL (ref 4.0–10.5)

## 2011-08-12 LAB — HEPATIC FUNCTION PANEL
ALT: 10 U/L (ref 0–35)
AST: 12 U/L (ref 0–37)
Albumin: 3.2 g/dL — ABNORMAL LOW (ref 3.5–5.2)
Alkaline Phosphatase: 59 U/L (ref 39–117)
Bilirubin, Direct: <0.1 mg/dL (ref 0.0–0.3)
Total Bilirubin: <0.1 mg/dL — ABNORMAL LOW (ref 0.3–1.2)
Total Protein: 6.7 g/dL (ref 6.0–8.3)

## 2011-08-12 LAB — GLUCOSE, CAPILLARY
Glucose-Capillary: 151 mg/dL — ABNORMAL HIGH (ref 70–99)
Glucose-Capillary: 160 mg/dL — ABNORMAL HIGH (ref 70–99)
Glucose-Capillary: 121 mg/dL — ABNORMAL HIGH (ref 70–99)
Glucose-Capillary: 118 mg/dL — ABNORMAL HIGH (ref 70–99)
Glucose-Capillary: 107 mg/dL — ABNORMAL HIGH (ref 70–99)

## 2011-08-12 LAB — URINALYSIS, ROUTINE W REFLEX MICROSCOPIC
Bilirubin Urine: NEGATIVE
Glucose, UA: NEGATIVE mg/dL
Hgb urine dipstick: NEGATIVE
Ketones, ur: NEGATIVE mg/dL
Nitrite: NEGATIVE
Protein, ur: NEGATIVE mg/dL
Specific Gravity, Urine: 1.02 (ref 1.005–1.030)
Urobilinogen, UA: 0.2 mg/dL (ref 0.0–1.0)
pH: 5 (ref 5.0–8.0)

## 2011-08-12 LAB — PRO B NATRIURETIC PEPTIDE: Pro B Natriuretic peptide (BNP): 155 pg/mL — ABNORMAL HIGH (ref 0–125)

## 2011-08-12 LAB — BASIC METABOLIC PANEL
BUN: 35 mg/dL — ABNORMAL HIGH (ref 6–23)
CO2: 21 mEq/L (ref 19–32)
Calcium: 9.7 mg/dL (ref 8.4–10.5)
Chloride: 97 mEq/L (ref 96–112)
Creatinine, Ser: 1.29 mg/dL — ABNORMAL HIGH (ref 0.50–1.10)
GFR calc Af Amer: 47 mL/min — ABNORMAL LOW (ref >90)
GFR calc non Af Amer: 41 mL/min — ABNORMAL LOW (ref >90)
Glucose, Bld: 226 mg/dL — ABNORMAL HIGH (ref 70–99)
Potassium: 4.3 mEq/L (ref 3.5–5.1)
Sodium: 132 mEq/L — ABNORMAL LOW (ref 135–145)

## 2011-08-12 LAB — PROTIME-INR
INR: 1.9 — ABNORMAL HIGH (ref 0.00–1.49)
Prothrombin Time: 22.1 seconds — ABNORMAL HIGH (ref 11.6–15.2)

## 2011-08-12 LAB — URINE MICROSCOPIC-ADD ON

## 2011-08-12 LAB — ABO/RH
ABO/RH(D): O POS
ABO/RH(D): O POS

## 2011-08-12 LAB — POCT I-STAT TROPONIN I: Troponin i, poc: 0 ng/mL (ref 0.00–0.08)

## 2011-08-12 LAB — CARDIAC PANEL(CRET KIN+CKTOT+MB+TROPI)
CK, MB: 2.3 ng/mL (ref 0.3–4.0)
Relative Index: INVALID (ref 0.0–2.5)
Total CK: 70 U/L (ref 7–177)
Troponin I: <0.3 ng/mL (ref <0.30)

## 2011-08-12 LAB — TYPE AND SCREEN
ABO/RH(D): O POS
Antibody Screen: NEGATIVE

## 2011-08-12 LAB — OCCULT BLOOD, POC DEVICE: Fecal Occult Bld: POSITIVE

## 2011-08-12 LAB — PREPARE RBC (CROSSMATCH)

## 2011-08-12 MED ORDER — METFORMIN HCL 500 MG PO TABS: 1000.0000 mg | ORAL_TABLET | Freq: Two times a day (BID) | ORAL | Status: DC

## 2011-08-12 MED ORDER — ONDANSETRON HCL 4 MG PO TABS: 4.0000 mg | ORAL_TABLET | Freq: Four times a day (QID) | ORAL | Status: DC | PRN

## 2011-08-12 MED ORDER — SODIUM CHLORIDE 0.9 % IV SOLN
8.0000 mg/h | INTRAVENOUS | Status: DC
  Administered 2011-08-12 – 2011-08-13 (×3): 8 mg/h via INTRAVENOUS
  Filled 2011-08-12 (×6): qty 80

## 2011-08-12 MED ORDER — SODIUM CHLORIDE 0.9 % IV BOLUS (SEPSIS)
1000.0000 mL | Freq: Once | INTRAVENOUS | Status: AC
  Administered 2011-08-12: 1000 mL via INTRAVENOUS

## 2011-08-12 MED ORDER — METOPROLOL SUCCINATE ER 100 MG PO TB24
100.0000 mg | ORAL_TABLET | Freq: Every day | ORAL | Status: DC
  Administered 2011-08-12 – 2011-08-14 (×3): 100 mg via ORAL
  Filled 2011-08-12 (×3): qty 1

## 2011-08-12 MED ORDER — POLYSACCHARIDE IRON COMPLEX 150 MG PO CAPS
150.0000 mg | ORAL_CAPSULE | Freq: Every day | ORAL | Status: DC
  Filled 2011-08-12: qty 1

## 2011-08-12 MED ORDER — LEVOTHYROXINE SODIUM 125 MCG PO TABS
125.0000 ug | ORAL_TABLET | Freq: Every day | ORAL | Status: DC
  Administered 2011-08-13 – 2011-08-14 (×2): 125 ug via ORAL
  Filled 2011-08-12 (×4): qty 1

## 2011-08-12 MED ORDER — SPIRONOLACTONE 25 MG PO TABS
25.0000 mg | ORAL_TABLET | Freq: Every day | ORAL | Status: DC
  Filled 2011-08-12: qty 1

## 2011-08-12 MED ORDER — METFORMIN HCL 500 MG PO TABS
1000.0000 mg | ORAL_TABLET | Freq: Two times a day (BID) | ORAL | Status: DC
  Administered 2011-08-13 – 2011-08-14 (×2): 1000 mg via ORAL
  Filled 2011-08-12 (×7): qty 2

## 2011-08-12 MED ORDER — PANTOPRAZOLE SODIUM 40 MG IV SOLR
40.0000 mg | Freq: Once | INTRAVENOUS | Status: AC
  Administered 2011-08-12: 40 mg via INTRAVENOUS
  Filled 2011-08-12: qty 40

## 2011-08-12 MED ORDER — ONDANSETRON HCL 4 MG/2ML IJ SOLN: 4.0000 mg | Freq: Four times a day (QID) | INTRAMUSCULAR | Status: DC | PRN

## 2011-08-12 MED ORDER — PANTOPRAZOLE SODIUM 40 MG IV SOLR
40.0000 mg | Freq: Two times a day (BID) | INTRAVENOUS | Status: DC
  Filled 2011-08-12 (×2): qty 40

## 2011-08-12 MED ORDER — SODIUM CHLORIDE 0.9 % IJ SOLN
3.0000 mL | Freq: Two times a day (BID) | INTRAMUSCULAR | Status: DC
  Administered 2011-08-12 – 2011-08-13 (×2): 3 mL via INTRAVENOUS

## 2011-08-12 MED ORDER — AMLODIPINE BESYLATE 10 MG PO TABS
10.0000 mg | ORAL_TABLET | Freq: Every day | ORAL | Status: DC
  Administered 2011-08-12: 10 mg via ORAL
  Filled 2011-08-12: qty 1

## 2011-08-12 MED ORDER — MOXIFLOXACIN HCL IN NACL 400 MG/250ML IV SOLN
400.0000 mg | Freq: Once | INTRAVENOUS | Status: AC
  Administered 2011-08-12: 400 mg via INTRAVENOUS
  Filled 2011-08-12: qty 250

## 2011-08-12 MED ORDER — ACETAMINOPHEN 325 MG PO TABS
650.0000 mg | ORAL_TABLET | Freq: Four times a day (QID) | ORAL | Status: DC | PRN
  Filled 2011-08-12: qty 1

## 2011-08-12 MED ORDER — DILTIAZEM HCL ER COATED BEADS 180 MG PO CP24
180.0000 mg | ORAL_CAPSULE | Freq: Every day | ORAL | Status: DC
  Administered 2011-08-12 – 2011-08-14 (×3): 180 mg via ORAL
  Filled 2011-08-12 (×3): qty 1

## 2011-08-12 MED ORDER — ACETAMINOPHEN 650 MG RE SUPP: 650.0000 mg | Freq: Four times a day (QID) | RECTAL | Status: DC | PRN

## 2011-08-12 MED ORDER — VITAMIN D3 25 MCG (1000 UNIT) PO TABS
2000.0000 [IU] | ORAL_TABLET | Freq: Every day | ORAL | Status: DC
  Administered 2011-08-12 – 2011-08-14 (×3): 2000 [IU] via ORAL
  Filled 2011-08-12 (×3): qty 2

## 2011-08-12 MED ORDER — SODIUM CHLORIDE 0.9 % IV SOLN
INTRAVENOUS | Status: DC
  Administered 2011-08-12 – 2011-08-13 (×3): via INTRAVENOUS

## 2011-08-12 MED ORDER — INSULIN ASPART 100 UNIT/ML ~~LOC~~ SOLN: 0.0000 [IU] | Freq: Three times a day (TID) | SUBCUTANEOUS | Status: DC

## 2011-08-12 MED ORDER — VITAMIN D3 25 MCG (1000 UT) PO CAPS: 2.0000 | ORAL_CAPSULE | Freq: Every day | ORAL | Status: DC

## 2011-08-12 MED ORDER — HYDROCHLOROTHIAZIDE 25 MG PO TABS
25.0000 mg | ORAL_TABLET | Freq: Every day | ORAL | Status: DC
  Administered 2011-08-12: 25 mg via ORAL
  Filled 2011-08-12: qty 1

## 2011-08-12 NOTE — Consult Note (Signed)
Referring Provider: Dr.HenselPrimary Care Physician:  Sanjuana Letters, MD Primary Gastroenterologist:  Dr.Patterson  Reason for Consultation:  Anemia, melena  HPI: Courtney Pena is a 71 y.o. female admitted earlier this morning after she presented to the emergency room with progressive complaints of dyspnea with exertion, dizziness, lightheadedness and black stools over the past one week. Apparently acutely this morning she had chest tightness with ambulation. Patient has history of adult onset diabetes mellitus, hypertension, hypothyroidism, sleep apnea and atrial fib/flutter. She is status post cardioversion in October of 2012 and has been on Xarelto  since. She also takes a baby aspirin every day and very occasional diclofenac. She is known to Dr. Jarold Motto from prior procedures and last had endoscopy in October 2012 per Dr. Rhea Belton which was done while she was in the hospital and had had a CT scan questioning an abnormality of the esophagus. 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 H. pylori was negative. She also had EGD in April of 2012 per Dr. Jarold Motto which showed a severe gastritis and a question of a pyloric channel ulcer. Biopsies were negative. Colonoscopy was done in April of 2012 showing moderate diverticulosis and a small cecal polyp which was removed and was benign.  Last hemoglobin found in review of labs showed hemoglobin of 12.5 in October of 2012. On presentation today her hemoglobin was 8.2 hematocrit 25.6 MCV of 84. Repeat hemoglobin since shows hemoglobin of 7.6.  Patient last took Xarelto yesterday   Past Medical History  Diagnosis Date  . Diabetes mellitus   . Allergy   . Arthritis   . Asthma   . GERD (gastroesophageal reflux disease)   . Hypertension   . Hyperlipidemia   . Neuromuscular disorder   . Hypothyroid   . Ulcer   . Sleep apnea     does not use CPAP  . Gastritis     . Hiatal hernia   . Obesity   . Hyperthyroidism   . Personal history of colonic polyps 06/06/2009    cecal polyp  . Atrial flutter 12-07-2010    converted in ED with 300 mg flecainide    Past Surgical History  Procedure Date  . Cataract extraction   . Carpal tunnel release 2003    bilateral  . Cardiac catheterization 1999&2004    Prior to Admission medications   Medication Sig Start Date End Date Taking? Authorizing Provider  acetaminophen (TYLENOL) 500 MG tablet Take 500-1,000 mg by mouth as needed. As needed for headaches or arthritis   Yes Historical Provider, MD  amLODipine (NORVASC) 10 MG tablet Take 10 mg by mouth daily. (at bedtime) 11/22/10 11/22/11 Yes Sanjuana Letters, MD  aspirin 81 MG tablet Take 81 mg by mouth daily. (at bedtime)   Yes Historical Provider, MD  Cholecalciferol (VITAMIN D3) 1000 UNITS CAPS Take 2 capsules by mouth daily.    Yes Historical Provider, MD  DICLOFENAC POTASSIUM PO Take 100 mg by mouth as needed. Take only for severe arthritis pain. Max use 1-2 times per month.   Yes Historical Provider, MD  diltiazem (CARDIZEM CD) 180 MG 24 hr capsule Take 1 capsule (180 mg total) by mouth daily. 03/30/11  Yes Sanjuana Letters, MD  FERREX 150 150 MG capsule TAKE 1 CAPSULE BY MOUTH TWICE DAILY 07/29/11  Yes Sanjuana Letters, MD  glipiZIDE (GLUCOTROL XL) 10 MG 24 hr tablet Take 1 tablet (10 mg total) by  mouth daily. 06/28/11 06/27/12 Yes Sanjuana Letters, MD  hydrochlorothiazide (HYDRODIURIL) 25 MG tablet TAKE 1 TABLET ONCE DAILY 06/25/11  Yes Sanjuana Letters, MD  levothyroxine (SYNTHROID, LEVOTHROID) 125 MCG tablet Take 1 tablet (125 mcg total) by mouth daily. 06/25/11  Yes Sanjuana Letters, MD  Liraglutide 18 MG/3ML SOLN Inject 0.2 mLs (1.2 mg total) into the skin daily before lunch. After a snack 06/15/11  Yes Sanjuana Letters, MD  metFORMIN (GLUCOPHAGE) 1000 MG tablet TAKE ONE TABLET BY MOUTH TWICE DAILY WITH MEALS 11/08/10  Yes Sanjuana Letters, MD  metoprolol succinate (TOPROL-XL) 100 MG 24 hr tablet TAKE 1 TABLET DAILY 06/25/11  Yes Sanjuana Letters, MD  omeprazole (PRILOSEC) 40 MG capsule Take 40 mg by mouth as needed. For stomach  11/22/10 11/22/11 Yes Sanjuana Letters, MD  propranolol (INDERAL) 10 MG tablet Take 10 mg by mouth as needed. For flutter or rapid heart beating   Yes Historical Provider, MD  Rivaroxaban (XARELTO) 20 MG TABS Take 20 mg by mouth daily. With evening meal 06/15/11  Yes Sanjuana Letters, MD  spironolactone (ALDACTONE) 25 MG tablet Take 1 tablet (25 mg total) by mouth daily. 03/30/11 03/29/12 Yes Sanjuana Letters, MD    Current Facility-Administered Medications  Medication Dose Route Frequency Provider Last Rate Last Dose  . 0.9 %  sodium chloride infusion   Intravenous Continuous Everrett Coombe, DO 125 mL/hr at 08/12/11 0730    . acetaminophen (TYLENOL) tablet 650 mg  650 mg Oral Q6H PRN Everrett Coombe, DO       Or  . acetaminophen (TYLENOL) suppository 650 mg  650 mg Rectal Q6H PRN Everrett Coombe, DO      . amLODipine (NORVASC) tablet 10 mg  10 mg Oral Daily Everrett Coombe, DO   10 mg at 08/12/11 1610  . cholecalciferol (VITAMIN D) tablet 2,000 Units  2,000 Units Oral Daily Sanjuana Letters, MD   2,000 Units at 08/12/11 [phone]  . diltiazem (CARDIZEM CD) 24 hr capsule 180 mg  180 mg Oral Daily Everrett Coombe, DO   180 mg at 08/12/11 0923  . hydrochlorothiazide (HYDRODIURIL) tablet 25 mg  25 mg Oral Daily Everrett Coombe, DO   25 mg at 08/12/11 0923  . insulin aspart (novoLOG) injection 0-9 Units  0-9 Units Subcutaneous TID WC Everrett Coombe, DO      . iron polysaccharides (NIFEREX) capsule 150 mg  150 mg Oral Daily Everrett Coombe, DO      . levothyroxine (SYNTHROID, LEVOTHROID) tablet 125 mcg  125 mcg Oral QAC breakfast Everrett Coombe, DO      . metFORMIN (GLUCOPHAGE) tablet 1,000 mg  1,000 mg Oral BID WC Sanjuana Letters, MD      . metoprolol succinate (TOPROL-XL) 24 hr tablet 100  mg  100 mg Oral Daily Everrett Coombe, DO   100 mg at 08/12/11 5409  . moxifloxacin (AVELOX) IVPB 400 mg  400 mg Intravenous Once Sunnie Nielsen, MD   400 mg at 08/12/11 0424  . ondansetron (ZOFRAN) tablet 4 mg  4 mg Oral Q6H PRN Everrett Coombe, DO       Or  . ondansetron (ZOFRAN) injection 4 mg  4 mg Intravenous Q6H PRN Everrett Coombe, DO      . pantoprazole (PROTONIX) injection 40 mg  40 mg Intravenous Once Sunnie Nielsen, MD   40 mg at 08/12/11 0451  . pantoprazole (PROTONIX) injection 40 mg  40 mg Intravenous Q12H Everrett Coombe, DO      .  sodium chloride 0.9 % bolus 1,000 mL  1,000 mL Intravenous Once Sunnie Nielsen, MD   1,000 mL at 08/12/11 0451  . sodium chloride 0.9 % injection 3 mL  3 mL Intravenous Q12H Everrett Coombe, DO      . DISCONTD: metFORMIN (GLUCOPHAGE) tablet 1,000 mg  1,000 mg Oral BID WC Everrett Coombe, DO      . DISCONTD: spironolactone (ALDACTONE) tablet 25 mg  25 mg Oral Daily Everrett Coombe, DO      . DISCONTD: Vitamin D3 CAPS 2,000 Units  2 capsule Oral Daily Everrett Coombe, DO        Allergies as of 08/12/2011 - Review Complete 08/12/2011  Allergen Reaction Noted  . Lisinopril Swelling 01/07/2006  . Losartan potassium Swelling 01/07/2006  . Penicillins Shortness Of Breath and Swelling 12/26/2010  . Sulfamethoxazole Hives and Itching 01/07/2006  . Pioglitazone Swelling 01/07/2006    Family History  Problem Relation Age of Onset  . Heart disease Father     History   Social History  . Marital Status: Married    Spouse Name: Greggory Stallion    Number of Children: 4  . Years of Education: some colle   Occupational History  . Management [employer]   Social History Main Topics  . Smoking status: Former Smoker -- 2.0 packs/day for 30 years    Types: Cigarettes    Quit date: 02/26/1993  . Smokeless tobacco: Never Used  . Alcohol Use: No  . Drug Use: No  . Sexually Active: Not on file   Other Topics Concern  . Not on file   Social History Narrative  . No narrative  on file    Review of Systems: Pertinent positive and negative review of systems were noted in the above HPI section.  All other review of systems was otherwise negative.  Physical Exam: Vital signs in last 24 hours: Temp:  [97 F (36.1 C)-99.3 F (37.4 C)] 99.3 F (37.4 C) (06/16 0622) Pulse Rate:  [83-97] 84  (06/16 0923) Resp:  [15-23] 20  (06/16 0622) BP: (103-145)/(45-80) 106/71 mmHg (06/16 0923) SpO2:  [96 %-100 %] 96 % (06/16 0622) Weight:  [264 lb (119.75 kg)-264 lb 12.4 oz (120.1 kg)] 264 lb 12.4 oz (120.1 kg) (06/16 0622) Last BM Date: 08/11/11 General:   Alert,  Well-developed,AA female well-nourished, pleasant and cooperative in NAD-very pale Head:  Normocephalic and atraumatic. Eyes:  Sclera clear, no icterus.   Conjunctiva pale Ears:  Normal auditory acuity. Nose:  No deformity, discharge,  or lesions. Mouth:  No deformity or lesions.  Lips pale Neck:  Supple; no masses or thyromegaly. Lungs:  Clear throughout to auscultation.   No wheezes, crackles, or rhonchi. Heart:  Regular rate and rhythm; no murmurs, clicks, rubs,  or gallops. Abdomen:  Soft,nontender, BS active,nonpalp mass or hsm.   Rectal:  Deferred,done in ER  and melena   Msk:  Symmetrical without gross deformities. . Pulses:  Normal pulses noted. Extremities:  Without clubbing or edema. Neurologic:  Alert and  oriented x4;  grossly normal neurologically. Skin:  Intact without significant lesions or rashes.. Psych:  Alert and cooperative. Normal mood and affect.  Intake/Output from previous day:   Intake/Output this shift:    Lab Results:  BNP 155  Basename 08/12/11 0830 08/12/11 0136  WBC 9.0 11.6*  HGB 7.6* 8.2*  HCT 23.9* 25.6*  PLT 240 270   BMET  Basename 08/12/11 0136  NA 132*  K 4.3  CL 97  CO2 21  GLUCOSE 226*  BUN 35*  CREATININE 1.29*  CALCIUM 9.7   LFT  Basename 08/12/11 0830  PROT 6.7  ALBUMIN 3.2*  AST 12  ALT 10  ALKPHOS 59  BILITOT <0.1*  BILIDIR <0.1  IBILI  NOT CALCULATED   PT/INR  Basename 08/12/11 0830  LABPROT 22.1*  INR 1.90*       Studies/Results: Dg Chest 2 View  08/12/2011  *RADIOLOGY REPORT*  Clinical Data: Dizziness, shortness of breath and chest pressure.  CHEST - 2 VIEW  Comparison: Chest radiograph performed 12/07/2010, and CT of the chest performed 03/12/2011  Findings: The lungs are well-aerated.  Mild focal right basilar airspace opacity is also characterized on the lateral view and raises concern for mild right lower lobe pneumonia.  There is no evidence of pleural effusion or pneumothorax.  The heart is mildly enlarged; the mediastinal contour is within normal limits.  No acute osseous abnormalities are seen.  IMPRESSION:  1.  Suspect mild right lower lobe pneumonia. 2.  Mild cardiomegaly.  Original Report Authenticated By: Tonia Ghent, M.D.    IMPRESSION:  #1 71 yo female with acute GI bleed, upper in the setting of chronic anticoagulation with Zarrella so and aspirin. Patient has previously documented GAVE syndrome which is likely source of her bleeding, though cannot rule out peptic ulcer disease.  #2 symptomatic anemia secondary to above  #3 chest tightness and dyspnea on exertion-probable demand ischemia #4 history of atrial fib/flutter status post cardioversion October 2012 #5 sleep apnea #6 obesity #7 adult-onset diabetes mellitus  PLAN: #1 clear liquid diet today #2 protonix infusion #3 hold Xarelto  And  aspirin #4 transfuse 2 units now and plan to keep her hemoglobin 9 #5 Scheduled for upper endoscopy with Dr. Leone Payor in a.m. tomorrow, with ablation of gastric AVMs if indicated. This is intentionally scheduled for tomorrow to allow Xarelto to  washout .  Amy Esterwood  08/12/2011, 9:44 AM      ________________________________________________________________________  [facility] MD note:  I personally examined the patient, reviewed the data and agree with the assessment and plan described above.   Melena for 5-7 days, signficant anemia now.  She is winded with any excertion, even talking and so I agree with blood transfusion 2units today. Will plan on EGD tomorrow, if GAVE again noticed and no other source of bleeding found, then likely APC treatment to ablate the GAVE.   Rob Bunting, MD [facility] Pager [phone]

## 2011-08-12 NOTE — ED Provider Notes (Signed)
History     CSN: [id number]  Arrival date & time 08/12/11  0128   First MD Initiated Contact with Patient 08/12/11 [phone]      Chief Complaint  Patient presents with  . Chest Pain    (Consider location/radiation/quality/duration/timing/severity/associated sxs/prior treatment) HPI History provided by patient. Black stools for about the last week. Alba Cory for atrial fibrillation and also takes iron. Patient attributed her black stools to iron pills. She has developed over the last few days shortness of breath with dyspnea on exertion even walking short distances between rooms in her house. Tonight she developed substernal chest discomfort and presents here for evaluation. No syncope. No back pain. Pain is dull and mild and not radiating. No leg pain or swelling. No blood in stools. No history of GI bleeding. Started on this blood thinner in October.  Symptoms moderate in severity. Improved with rest. She has had some cough denies any fever. Some yellow phlegm. No sick contacts at home. Past Medical History  Diagnosis Date  . Diabetes mellitus   . Allergy   . Arthritis   . Asthma   . GERD (gastroesophageal reflux disease)   . Hypertension   . Hyperlipidemia   . Neuromuscular disorder   . Hypothyroid   . Ulcer   . Sleep apnea     does not use CPAP  . Gastritis   . Hiatal hernia   . Obesity   . Hyperthyroidism   . Personal history of colonic polyps 06/06/2009    cecal polyp  . Atrial flutter 12-07-2010    converted in ED with 300 mg flecainide    Past Surgical History  Procedure Date  . Cataract extraction   . Carpal tunnel release 2003    bilateral  . Cardiac catheterization 1999&2004    Family History  Problem Relation Age of Onset  . Heart disease Father     History  Substance Use Topics  . Smoking status: Former Smoker -- 2.0 packs/day for 30 years    Types: Cigarettes    Quit date: 02/26/1993  . Smokeless tobacco: Never Used  . Alcohol Use: No    OB  History    Grav Para Term Preterm Abortions TAB SAB Ect Mult Living                  Review of Systems  Constitutional: Negative for fever and chills.  HENT: Negative for neck pain and neck stiffness.   Eyes: Negative for pain.  Respiratory: Positive for cough and shortness of breath.   Cardiovascular: Positive for chest pain.  Gastrointestinal: Negative for abdominal pain and rectal pain.  Genitourinary: Negative for dysuria.  Musculoskeletal: Negative for back pain.  Skin: Negative for rash.  Neurological: Negative for headaches.  All other systems reviewed and are negative.    Allergies  Lisinopril; Losartan potassium; Penicillins; Sulfamethoxazole; and Pioglitazone  Home Medications   Current Outpatient Rx  Name Route Sig Dispense Refill  . ACETAMINOPHEN 500 MG PO TABS Oral Take 500-1,000 mg by mouth as needed. As needed for headaches or arthritis    . AMLODIPINE BESYLATE 10 MG PO TABS Oral Take 10 mg by mouth daily. (at bedtime)    . ASPIRIN 81 MG PO TABS Oral Take 81 mg by mouth daily. (at bedtime)    . VITAMIN D3 1000 UNITS PO CAPS Oral Take 2 capsules by mouth daily.     Marland Kitchen DICLOFENAC POTASSIUM PO Oral Take 100 mg by mouth as needed. Take only for  severe arthritis pain. Max use 1-2 times per month.    Marland Kitchen DILTIAZEM HCL ER COATED BEADS 180 MG PO CP24 Oral Take 1 capsule (180 mg total) by mouth daily. 90 capsule 3  . FERREX 150 150 MG PO CAPS  TAKE 1 CAPSULE BY MOUTH TWICE DAILY 60 capsule 2  . GLIPIZIDE ER 10 MG PO TB24 Oral Take 1 tablet (10 mg total) by mouth daily. 90 tablet 3  . HYDROCHLOROTHIAZIDE 25 MG PO TABS  TAKE 1 TABLET ONCE DAILY 90 tablet 1  . LEVOTHYROXINE SODIUM 125 MCG PO TABS Oral Take 1 tablet (125 mcg total) by mouth daily. 90 tablet 4  . LIRAGLUTIDE 18 MG/3ML [place] SOLN Subcutaneous Inject 0.2 mLs (1.2 mg total) into the skin daily before lunch. After a snack 18 mL 3  . METFORMIN HCL 1000 MG PO TABS  TAKE ONE TABLET BY MOUTH TWICE DAILY WITH MEALS 180  tablet 3  . METOPROLOL SUCCINATE ER 100 MG PO TB24  TAKE 1 TABLET DAILY 90 tablet 2  . OMEPRAZOLE 40 MG PO CPDR Oral Take 40 mg by mouth as needed. For stomach     . PROPRANOLOL HCL 10 MG PO TABS Oral Take 10 mg by mouth as needed. For flutter or rapid heart beating    . RIVAROXABAN 20 MG PO TABS Oral Take 20 mg by mouth daily. With evening meal 90 tablet 3  . SPIRONOLACTONE 25 MG PO TABS Oral Take 1 tablet (25 mg total) by mouth daily. 90 tablet 3    BP 112/70  Pulse 86  Temp 97 F (36.1 C) (Oral)  Resp 19  Wt 264 lb (119.75 kg)  SpO2 99%  Physical Exam  Constitutional: She is oriented to person, place, and time. She appears well-developed and well-nourished.  HENT:  Head: Normocephalic and atraumatic.  Eyes: Conjunctivae and EOM are normal. Pupils are equal, round, and reactive to light.  Neck: Trachea normal. Neck supple. No thyromegaly present.  Cardiovascular: Normal rate, regular rhythm, S1 normal, S2 normal and normal pulses.     No systolic murmur is present   No diastolic murmur is present  Pulses:      Radial pulses are 2+ on the right side, and 2+ on the left side.  Pulmonary/Chest: Effort normal and breath sounds normal. She has no wheezes. She has no rhonchi. She has no rales. She exhibits no tenderness.  Abdominal: Soft. Normal appearance and bowel sounds are normal. There is no tenderness. There is no CVA tenderness and negative Murphy's sign.  Genitourinary:       Rectal exam with nontender external hemorrhoids and black stool. No masses  Musculoskeletal:       BLE:s Calves nontender, no cords or erythema, negative Homans sign  Neurological: She is alert and oriented to person, place, and time. She has normal strength. No cranial nerve deficit or sensory deficit. GCS eye subscore is 4. GCS verbal subscore is 5. GCS motor subscore is 6.  Skin: Skin is warm and dry. No rash noted. She is not diaphoretic.  Psychiatric: Her speech is normal.       Cooperative and  appropriate    ED Course  Procedures (including critical care time)  Results for orders placed during the hospital encounter of 08/12/11  CBC      Component Value Range   WBC 11.6 (*) 4.0 - 10.5 K/uL   RBC 3.03 (*) 3.87 - 5.11 MIL/uL   Hemoglobin 8.2 (*) 12.0 - 15.0 g/dL  HCT 25.6 (*) 36.0 - 46.0 %   MCV 84.5  78.0 - 100.0 fL   MCH 27.1  26.0 - 34.0 pg   MCHC 32.0  30.0 - 36.0 g/dL   RDW 08.6 (*) 57.8 - 46.9 %   Platelets 270  150 - 400 K/uL  BASIC METABOLIC PANEL      Component Value Range   Sodium 132 (*) 135 - 145 mEq/L   Potassium 4.3  3.5 - 5.1 mEq/L   Chloride 97  96 - 112 mEq/L   CO2 21  19 - 32 mEq/L   Glucose, Bld 226 (*) 70 - 99 mg/dL   BUN 35 (*) 6 - 23 mg/dL   Creatinine, Ser 6.29 (*) 0.50 - 1.10 mg/dL   Calcium 9.7  8.4 - 52.8 mg/dL   GFR calc non Af Amer 41 (*) >90 mL/min   GFR calc Af Amer 47 (*) >90 mL/min  PRO B NATRIURETIC PEPTIDE      Component Value Range   Pro B Natriuretic peptide (BNP) 155.0 (*) 0 - 125 pg/mL  URINALYSIS, ROUTINE W REFLEX MICROSCOPIC      Component Value Range   Color, Urine YELLOW  YELLOW   APPearance CLOUDY (*) CLEAR   Specific Gravity, Urine 1.020  1.005 - 1.030   pH 5.0  5.0 - 8.0   Glucose, UA NEGATIVE  NEGATIVE mg/dL   Hgb urine dipstick NEGATIVE  NEGATIVE   Bilirubin Urine NEGATIVE  NEGATIVE   Ketones, ur NEGATIVE  NEGATIVE mg/dL   Protein, ur NEGATIVE  NEGATIVE mg/dL   Urobilinogen, UA 0.2  0.0 - 1.0 mg/dL   Nitrite NEGATIVE  NEGATIVE   Leukocytes, UA LARGE (*) NEGATIVE  POCT I-STAT TROPONIN I      Component Value Range   Troponin i, poc 0.00  0.00 - 0.08 ng/mL   Comment 3           URINE MICROSCOPIC-ADD ON      Component Value Range   Squamous Epithelial / LPF FEW (*) RARE   WBC, UA TOO NUMEROUS TO COUNT  <3 WBC/hpf   RBC / HPF 0-2  <3 RBC/hpf   Bacteria, UA FEW (*) RARE   Casts HYALINE CASTS (*) NEGATIVE   Crystals URIC ACID CRYSTALS (*) NEGATIVE  OCCULT BLOOD, POC DEVICE      Component Value Range   Fecal  Occult Bld POSITIVE     Dg Chest 2 View  08/12/2011  *RADIOLOGY REPORT*  Clinical Data: Dizziness, shortness of breath and chest pressure.  CHEST - 2 VIEW  Comparison: Chest radiograph performed 12/07/2010, and CT of the chest performed 03/12/2011  Findings: The lungs are well-aerated.  Mild focal right basilar airspace opacity is also characterized on the lateral view and raises concern for mild right lower lobe pneumonia.  There is no evidence of pleural effusion or pneumothorax.  The heart is mildly enlarged; the mediastinal contour is within normal limits.  No acute osseous abnormalities are seen.  IMPRESSION:  1.  Suspect mild right lower lobe pneumonia. 2.  Mild cardiomegaly.  Original Report Authenticated By: Tonia Ghent, M.D.    Date: 08/12/2011  Rate: 84  Rhythm: normal sinus rhythm  QRS Axis: normal  Intervals: normal  ST/T Wave abnormalities: nonspecific ST changes  Conduction Disutrbances:none  Narrative Interpretation:   Old EKG Reviewed: none available   4:30 AM discussed with [place] [facility] resident on call. He accepts patient in transfer to [facility] cone for Dr. Leveda Anna. Blood  pressure in normal range. Currently in normal sinus rhythm  MDM   GI bleed on Xaralto. Symptomatic anemia. IV Protonix. No free air and chest x-ray. Has UTI and questionable pneumonia. IV antibiotics initiated. EKG, labs and UA and x-ray obtained and reviewed as above. Medicine consult for admission. Type and screen.   CRITICAL CARE Performed by: Sunnie Nielsen   Total critical care time: 35  Critical care time was exclusive of separately billable procedures and treating other patients.  Critical care was necessary to treat or prevent imminent or life-threatening deterioration.  Critical care was time spent personally by me on the following activities: development of treatment plan with patient and/or surrogate as well as nursing, discussions with consultants, evaluation of patient's  response to treatment, examination of patient, obtaining history from patient or surrogate, ordering and performing treatments and interventions, ordering and review of laboratory studies, ordering and review of radiographic studies, pulse oximetry and re-evaluation of patient's condition. IV fluids for borderline hypotension with improving systolic blood pressure. Patient and family updated bedside with serial evaluations. Old records and labs reviewed.           Sunnie Nielsen, MD 08/12/11 [phone]

## 2011-08-12 NOTE — H&P (Signed)
Seen and exam.  Patient quite well known to me.  Discussed with Dr. Ashley Royalty.  Agree with his management.  Appreciate GI help.  Briefly, 71 yo female with GI bleed on Xaralto.  Likely subacute bleed rather than acute.   Holding blood thinners, using PPI and plan for EGD. She has also been dizzy with low BPs at home for past week.  Likely 2nd to GI bleed and anemia.  Hold HCTZ and Amlodipine.  Will continue ditiazem and metoprolol because of atrial fib Long term anticoag will be an issue.  Await EGD before deciding.   Has mild acute renal failure, likely 2nd to GI bleed.  Also has used intermittent NSAIDs.  Given GI bleed hx, DM and risk for renal problems, I have told her no more NSAIDs

## 2011-08-12 NOTE — H&P (Signed)
Courtney Pena is an 71 y.o. female.   Chief Complaint: Dark stool, DOE  HPI: 71 yo female with multiple medical problems including GERD, DM, HTN, and A.Fib here with complaint of dyspnea on exertion, dizziness with standing and dark stools.  States that over the past week she has been having increased dyspnea with activity especially walking and dizziness while standing.  Came to the ED today because she was have some chest discomfort along with her DOE.  Chest pain is substernal, non radiating and described at "tightness". She states that around the same time she began having her shortness or breath and dizziness she noticed that her stool had become dark black.  She thought this was probably due to the iron supplement she takes.  She does have a history of severe gastritis on EGD done in 05/2010 by Dr. Jarold Motto and has been on Xarelto for anticoagulation in PAF.  She reports that she uses her diclofenac typically 1-2 times per month.  She does endorse some mild nausea and mild lower abdominal cramping.  She denies any dyspnea at rest.  Denies other NSAID use other than occasional diclofenac.  Past Medical History  Diagnosis Date  . Diabetes mellitus   . Allergy   . Arthritis   . Asthma   . GERD (gastroesophageal reflux disease)   . Hypertension   . Hyperlipidemia   . Neuromuscular disorder   . Hypothyroid   . Ulcer   . Sleep apnea     does not use CPAP  . Gastritis   . Hiatal hernia   . Obesity   . Hyperthyroidism   . Personal history of colonic polyps 06/06/2009    cecal polyp  . Atrial flutter 12-07-2010    converted in ED with 300 mg flecainide    Past Surgical History  Procedure Date  . Cataract extraction   . Carpal tunnel release 2003    bilateral  . Cardiac catheterization 1999&2004    Family History  Problem Relation Age of Onset  . Heart disease Father    Social History:  reports that she quit smoking about 18 years ago. Her smoking use included  Cigarettes. She has a 60 pack-year smoking history. She has never used smokeless tobacco. She reports that she does not drink alcohol or use illicit drugs.  Allergies:  Allergies  Allergen Reactions  . Lisinopril Swelling    REACTION: swelling, may have had some breathing involvement.   . Losartan Potassium Swelling    REACTION: swelling - May have been some breathing.   Marland Kitchen Penicillins Shortness Of Breath and Swelling    Arm Swelling with Penicillin (Occurred in 1960s) Breathing - throat swelling with Amoxicillin (Occurred prior to 2002)  . Sulfamethoxazole Hives and Itching    "welps all over" immediately after dose  . Pioglitazone Swelling    REACTION: swelling all over body    Medications Prior to Admission  Medication Sig Dispense Refill  . acetaminophen (TYLENOL) 500 MG tablet Take 500-1,000 mg by mouth as needed. As needed for headaches or arthritis      . amLODipine (NORVASC) 10 MG tablet Take 10 mg by mouth daily. (at bedtime)      . aspirin 81 MG tablet Take 81 mg by mouth daily. (at bedtime)      . Cholecalciferol (VITAMIN D3) 1000 UNITS CAPS Take 2 capsules by mouth daily.       Marland Kitchen DICLOFENAC POTASSIUM PO Take 100 mg by mouth as needed. Take only for severe arthritis  pain. Max use 1-2 times per month.      . diltiazem (CARDIZEM CD) 180 MG 24 hr capsule Take 1 capsule (180 mg total) by mouth daily.  90 capsule  3  . FERREX 150 150 MG capsule TAKE 1 CAPSULE BY MOUTH TWICE DAILY  60 capsule  2  . glipiZIDE (GLUCOTROL XL) 10 MG 24 hr tablet Take 1 tablet (10 mg total) by mouth daily.  90 tablet  3  . hydrochlorothiazide (HYDRODIURIL) 25 MG tablet TAKE 1 TABLET ONCE DAILY  90 tablet  1  . levothyroxine (SYNTHROID, LEVOTHROID) 125 MCG tablet Take 1 tablet (125 mcg total) by mouth daily.  90 tablet  4  . Liraglutide 18 MG/3ML SOLN Inject 0.2 mLs (1.2 mg total) into the skin daily before lunch. After a snack  18 mL  3  . metFORMIN (GLUCOPHAGE) 1000 MG tablet TAKE ONE TABLET BY MOUTH  TWICE DAILY WITH MEALS  180 tablet  3  . metoprolol succinate (TOPROL-XL) 100 MG 24 hr tablet TAKE 1 TABLET DAILY  90 tablet  2  . omeprazole (PRILOSEC) 40 MG capsule Take 40 mg by mouth as needed. For stomach       . propranolol (INDERAL) 10 MG tablet Take 10 mg by mouth as needed. For flutter or rapid heart beating      . Rivaroxaban (XARELTO) 20 MG TABS Take 20 mg by mouth daily. With evening meal  90 tablet  3  . spironolactone (ALDACTONE) 25 MG tablet Take 1 tablet (25 mg total) by mouth daily.  90 tablet  3    Results for orders placed during the hospital encounter of 08/12/11 (from the past 48 hour(s))  CBC     Status: Abnormal   Collection Time   08/12/11  1:36 AM      Component Value Range Comment   WBC 11.6 (*) 4.0 - 10.5 K/uL    RBC 3.03 (*) 3.87 - 5.11 MIL/uL    Hemoglobin 8.2 (*) 12.0 - 15.0 g/dL    HCT 16.1 (*) 09.6 - 46.0 %    MCV 84.5  78.0 - 100.0 fL    MCH 27.1  26.0 - 34.0 pg    MCHC 32.0  30.0 - 36.0 g/dL    RDW 04.5 (*) 40.9 - 15.5 %    Platelets 270  150 - 400 K/uL   BASIC METABOLIC PANEL     Status: Abnormal   Collection Time   08/12/11  1:36 AM      Component Value Range Comment   Sodium 132 (*) 135 - 145 mEq/L    Potassium 4.3  3.5 - 5.1 mEq/L    Chloride 97  96 - 112 mEq/L    CO2 21  19 - 32 mEq/L    Glucose, Bld 226 (*) 70 - 99 mg/dL    BUN 35 (*) 6 - 23 mg/dL    Creatinine, Ser 8.11 (*) 0.50 - 1.10 mg/dL    Calcium 9.7  8.4 - 91.4 mg/dL    GFR calc non Af Amer 41 (*) >90 mL/min    GFR calc Af Amer 47 (*) >90 mL/min   PRO B NATRIURETIC PEPTIDE     Status: Abnormal   Collection Time   08/12/11  1:36 AM      Component Value Range Comment   Pro B Natriuretic peptide (BNP) 155.0 (*) 0 - 125 pg/mL   URINALYSIS, ROUTINE W REFLEX MICROSCOPIC     Status: Abnormal   Collection Time  08/12/11  2:15 AM      Component Value Range Comment   Color, Urine YELLOW  YELLOW    APPearance CLOUDY (*) CLEAR    Specific Gravity, Urine 1.020  1.005 - 1.030    pH 5.0   5.0 - 8.0    Glucose, UA NEGATIVE  NEGATIVE mg/dL    Hgb urine dipstick NEGATIVE  NEGATIVE    Bilirubin Urine NEGATIVE  NEGATIVE    Ketones, ur NEGATIVE  NEGATIVE mg/dL    Protein, ur NEGATIVE  NEGATIVE mg/dL    Urobilinogen, UA 0.2  0.0 - 1.0 mg/dL    Nitrite NEGATIVE  NEGATIVE    Leukocytes, UA LARGE (*) NEGATIVE   POCT I-STAT TROPONIN I     Status: Normal   Collection Time   08/12/11  2:15 AM      Component Value Range Comment   Troponin i, poc 0.00  0.00 - 0.08 ng/mL    Comment 3            URINE MICROSCOPIC-ADD ON     Status: Abnormal   Collection Time   08/12/11  2:15 AM      Component Value Range Comment   Squamous Epithelial / LPF FEW (*) RARE    WBC, UA TOO NUMEROUS TO COUNT  <3 WBC/hpf    RBC / HPF 0-2  <3 RBC/hpf    Bacteria, UA FEW (*) RARE    Casts HYALINE CASTS (*) NEGATIVE    Crystals URIC ACID CRYSTALS (*) NEGATIVE   OCCULT BLOOD, POC DEVICE     Status: Normal   Collection Time   08/12/11  4:23 AM      Component Value Range Comment   Fecal Occult Bld POSITIVE     TYPE AND SCREEN     Status: Normal   Collection Time   08/12/11  4:35 AM      Component Value Range Comment   ABO/RH(D) O POS      Antibody Screen NEG      Sample Expiration 08/15/2011     ABO/RH     Status: Normal   Collection Time   08/12/11  4:35 AM      Component Value Range Comment   ABO/RH(D) O POS     GLUCOSE, CAPILLARY     Status: Abnormal   Collection Time   08/12/11  5:48 AM      Component Value Range Comment   Glucose-Capillary 151 (*) 70 - 99 mg/dL    Dg Chest 2 View  1/61/0960  *RADIOLOGY REPORT*  Clinical Data: Dizziness, shortness of breath and chest pressure.  CHEST - 2 VIEW  Comparison: Chest radiograph performed 12/07/2010, and CT of the chest performed 03/12/2011  Findings: The lungs are well-aerated.  Mild focal right basilar airspace opacity is also characterized on the lateral view and raises concern for mild right lower lobe pneumonia.  There is no evidence of pleural  effusion or pneumothorax.  The heart is mildly enlarged; the mediastinal contour is within normal limits.  No acute osseous abnormalities are seen.  IMPRESSION:  1.  Suspect mild right lower lobe pneumonia. 2.  Mild cardiomegaly.  Original Report Authenticated By: Tonia Ghent, M.D.    Review of Systems  Constitutional: Positive for malaise/fatigue. Negative for fever, chills and diaphoresis.  HENT: Negative for congestion.   Eyes: Negative for blurred vision.  Respiratory: Negative for cough, hemoptysis and wheezing.   Cardiovascular: Positive for chest pain. Negative for palpitations and leg swelling.  Gastrointestinal:  Positive for heartburn, nausea and melena. Negative for vomiting.  Genitourinary: Negative for dysuria, urgency and frequency.  Neurological: Positive for dizziness. Negative for weakness and headaches.    Blood pressure 145/62, pulse 90, temperature 99.3 F (37.4 C), temperature source Oral, resp. rate 20, height 5' 5.5" (1.664 m), weight 264 lb 12.4 oz (120.1 kg), SpO2 96.00%. Physical Exam  Constitutional: She is oriented to person, place, and time.       Obese female, no distress.   HENT:  Head: Normocephalic and atraumatic.  Mouth/Throat: Oropharynx is clear and moist.  Eyes: Conjunctivae and EOM are normal. Pupils are equal, round, and reactive to light. No scleral icterus.  Neck: Neck supple. No thyromegaly present.  Cardiovascular:       IRR, no murmur   Respiratory: Effort normal and breath sounds normal. No respiratory distress. She has no wheezes.  GI: Soft. Bowel sounds are normal. She exhibits no distension. There is no tenderness. There is no guarding.  Musculoskeletal: She exhibits no edema and no tenderness.  Neurological: She is alert and oriented to person, place, and time.     Assessment/Plan  71 yo female on xarelto with history of gastritis here with GI bleed  1. GI Bleed:  Given history of melena and known history of gastritis, likely  upper GI source.  Hemoglobin is 8.2, down from her baseline of 12-13.  VSS.  Will plan to recheck CBC now and start PPI.  Will hold xarelto. Do not see an overwhelming need to transfuse at this time, unless hgb has dropped further on next check.  Continue iron supplementation, d/c diclofenac.  GI consult for possible repeat EGD, has seen [facility] in the past.   2. A. Fib:  Rate controlled.  Continue diltiazem and metoprolol.  Xarelto being held 2/2 to problem #1.  Will need to address anticoagulation once acute bleeding is controlled.    3. DOE: Likely related to anemia.  CXR with possible PNA, although afebrile. Received 1 dose of Avelox.  I am not compelled to continue that at this time.  Given chest pain as well, we will check cardiac enzymes.  Has history of pulmonary nodule, due for 6 month f/u for this.  Could obtain while in house to evaluate and to better characterize RLL opacity.   4. Acute Kidney injury:  Cr up from baseline 0.7-0.8 to 1.29.  May be due to hypoperfusion with drop in hgb.  Will hydrate and follow to see if this improves.    5.  DM:  Will continue metformin.  Hold sulfonylurea and liraglutide while NPO.  Will add on SSI.  6. HTN:  Continue home antihypertensives for now.   7. Hypothyroidism:  Continue synthroid at home dose.   8.  FEN/GI:  NPO for now, NS @ 11mL/hr  9.  PPX:  PPI and SCD's in setting of GI bleed  10.  Dispo:  Pending improvement  Adianna Darwin 08/12/2011, 6:55 AM

## 2011-08-12 NOTE — ED Notes (Signed)
Pt alert, c/o mid sternal chest pain, onset was today, hx afib, htn, dm, describes pain as tightness, resp even unlabored, skin pwd

## 2011-08-12 NOTE — Progress Notes (Signed)
Called to restart a peripheral iv;  Staff RN had started site in [place]; no redness or swelling; pt c/o "burning" however no fluids were infusing at the time; she had rec'd one unit of bld;  Family member requesting site be changed as she had to get a 2nd unit of bld and she didn't want the iv to "go bad" ;  Pt with poor access, however, new site was started on the 1st att in the left anterior forearm;  RN to remove site in [facility].    Family members at bedside during IV restart;   Barkley Bruns RN [facility], IV Team

## 2011-08-12 NOTE — ED Notes (Signed)
Patient in restroom.

## 2011-08-12 NOTE — ED Notes (Signed)
Bed:WA11<BR> Expected date:<BR> Expected time:<BR> Means of arrival:<BR> Comments:<BR> triage

## 2011-08-13 ENCOUNTER — Inpatient Hospital Stay (HOSPITAL_COMMUNITY): Payer: 59

## 2011-08-13 ENCOUNTER — Encounter (HOSPITAL_COMMUNITY): Payer: Self-pay | Admitting: *Deleted

## 2011-08-13 ENCOUNTER — Encounter (HOSPITAL_COMMUNITY)
Admission: EM | Disposition: A | Discharge status: Home / Self Care | Payer: Self-pay | Source: Home / Self Care | Attending: Family Medicine

## 2011-08-13 DIAGNOSIS — K31819 Angiodysplasia of stomach and duodenum without bleeding: Secondary | ICD-10-CM

## 2011-08-13 HISTORY — PX: ESOPHAGOGASTRODUODENOSCOPY: SHX5428

## 2011-08-13 LAB — CBC
HCT: 28.2 % — ABNORMAL LOW (ref 36.0–46.0)
Hemoglobin: 9.2 g/dL — ABNORMAL LOW (ref 12.0–15.0)
MCH: 27.7 pg (ref 26.0–34.0)
MCHC: 32.6 g/dL (ref 30.0–36.0)
MCV: 84.9 fL (ref 78.0–100.0)
Platelets: 236 10*3/uL (ref 150–400)
RBC: 3.32 MIL/uL — ABNORMAL LOW (ref 3.87–5.11)
RDW: 16.9 % — ABNORMAL HIGH (ref 11.5–15.5)
WBC: 6.7 10*3/uL (ref 4.0–10.5)

## 2011-08-13 LAB — BASIC METABOLIC PANEL
BUN: 16 mg/dL (ref 6–23)
CO2: 21 mEq/L (ref 19–32)
Calcium: 9.1 mg/dL (ref 8.4–10.5)
Chloride: 106 mEq/L (ref 96–112)
Creatinine, Ser: 0.85 mg/dL (ref 0.50–1.10)
GFR calc Af Amer: 78 mL/min — ABNORMAL LOW (ref >90)
GFR calc non Af Amer: 67 mL/min — ABNORMAL LOW (ref >90)
Glucose, Bld: 192 mg/dL — ABNORMAL HIGH (ref 70–99)
Potassium: 4.3 mEq/L (ref 3.5–5.1)
Sodium: 139 mEq/L (ref 135–145)

## 2011-08-13 LAB — TYPE AND SCREEN
ABO/RH(D): O POS
Antibody Screen: NEGATIVE
Unit division: 0
Unit division: 0

## 2011-08-13 LAB — CARDIAC PANEL(CRET KIN+CKTOT+MB+TROPI)
CK, MB: 1.8 ng/mL (ref 0.3–4.0)
CK, MB: 1.7 ng/mL (ref 0.3–4.0)
Relative Index: INVALID (ref 0.0–2.5)
Relative Index: INVALID (ref 0.0–2.5)
Total CK: 62 U/L (ref 7–177)
Total CK: 63 U/L (ref 7–177)
Troponin I: <0.3 ng/mL (ref <0.30)
Troponin I: <0.3 ng/mL (ref <0.30)

## 2011-08-13 LAB — URINE CULTURE
Colony Count: NO GROWTH
Culture  Setup Time: 201306161656
Culture: NO GROWTH

## 2011-08-13 LAB — HEMOGLOBIN AND HEMATOCRIT, BLOOD
HCT: 28.5 % — ABNORMAL LOW (ref 36.0–46.0)
HCT: 29.6 % — ABNORMAL LOW (ref 36.0–46.0)
Hemoglobin: 9 g/dL — ABNORMAL LOW (ref 12.0–15.0)
Hemoglobin: 9.4 g/dL — ABNORMAL LOW (ref 12.0–15.0)

## 2011-08-13 LAB — GLUCOSE, CAPILLARY
Glucose-Capillary: 183 mg/dL — ABNORMAL HIGH (ref 70–99)
Glucose-Capillary: 153 mg/dL — ABNORMAL HIGH (ref 70–99)
Glucose-Capillary: 130 mg/dL — ABNORMAL HIGH (ref 70–99)
Glucose-Capillary: 166 mg/dL — ABNORMAL HIGH (ref 70–99)

## 2011-08-13 SURGERY — EGD (ESOPHAGOGASTRODUODENOSCOPY)
Anesthesia: Moderate Sedation

## 2011-08-13 MED ORDER — MIDAZOLAM HCL 10 MG/2ML IJ SOLN
INTRAMUSCULAR | Status: DC | PRN
  Administered 2011-08-13: 1 mg via INTRAVENOUS
  Administered 2011-08-13: 2.5 mg via INTRAVENOUS
  Administered 2011-08-13: 1.5 mg via INTRAVENOUS
  Administered 2011-08-13: 1 mg via INTRAVENOUS

## 2011-08-13 MED ORDER — PANTOPRAZOLE SODIUM 40 MG PO TBEC
40.0000 mg | DELAYED_RELEASE_TABLET | Freq: Every day | ORAL | Status: DC
  Administered 2011-08-13 – 2011-08-14 (×2): 40 mg via ORAL
  Filled 2011-08-13: qty 1

## 2011-08-13 MED ORDER — FENTANYL CITRATE 0.05 MG/ML IJ SOLN
INTRAMUSCULAR | Status: AC
  Filled 2011-08-13: qty 4

## 2011-08-13 MED ORDER — FENTANYL NICU IV SYRINGE 50 MCG/ML
INJECTION | INTRAMUSCULAR | Status: DC | PRN
  Administered 2011-08-13: 25 ug via INTRAVENOUS
  Administered 2011-08-13: 15 ug via INTRAVENOUS
  Administered 2011-08-13: 10 ug via INTRAVENOUS

## 2011-08-13 MED ORDER — SODIUM CHLORIDE 0.9 % IV SOLN: 250.0000 mL | INTRAVENOUS | Status: DC | PRN

## 2011-08-13 MED ORDER — GLIPIZIDE ER 10 MG PO TB24
10.0000 mg | ORAL_TABLET | Freq: Every day | ORAL | Status: DC
  Administered 2011-08-14: 10 mg via ORAL
  Filled 2011-08-13 (×2): qty 1

## 2011-08-13 MED ORDER — BUTAMBEN-TETRACAINE-BENZOCAINE 2-2-14 % EX AERO
INHALATION_SPRAY | CUTANEOUS | Status: DC | PRN
  Administered 2011-08-13: 2 via TOPICAL

## 2011-08-13 MED ORDER — MIDAZOLAM HCL 10 MG/2ML IJ SOLN
INTRAMUSCULAR | Status: AC
  Filled 2011-08-13: qty 4

## 2011-08-13 MED ORDER — SODIUM CHLORIDE 0.9 % IJ SOLN
3.0000 mL | Freq: Two times a day (BID) | INTRAMUSCULAR | Status: DC
  Administered 2011-08-13 – 2011-08-14 (×2): 3 mL via INTRAVENOUS

## 2011-08-13 MED ORDER — SODIUM CHLORIDE 0.9 % IJ SOLN: 3.0000 mL | INTRAMUSCULAR | Status: DC | PRN

## 2011-08-13 NOTE — Progress Notes (Signed)
Inpatient Diabetes Program Recommendations  AACE/ADA: New Consensus Statement on Inpatient Glycemic Control  Target Ranges:  Prepandial:   less than 140 mg/dL      Peak postprandial:   less than 180 mg/dL (1-2 hours)      Critically ill patients:  140 - 180 mg/dL  Pager:  [phone] Hours:  8 am-10pm   Reason for Visit: Elevated glucose:   Results for Courtney Pena, Courtney Pena (MRN [id number]) as of 08/13/2011 12:25  Ref. Range 08/13/2011 07:51 08/13/2011 11:42  Glucose-Capillary Latest Range: 70-99 mg/dL 034 (H) 742 (H)    Inpatient Diabetes Program Recommendations Correction (SSI): Increase to Novolog Moderate Correction  Alfredia Client PhD, RN Diabetes Coordinator  Office:  [phone] Team Pager:  [phone]

## 2011-08-13 NOTE — Progress Notes (Signed)
PGY-1 Daily Progress Note Family Medicine Teaching Service Courtney Pena. Marti Sleigh, MD Service Pager: [phone]   Subjective: Patient down for endoscopy  Objective:  Temp:  [97.1 F (36.2 C)-98.6 F (37 C)] 98.6 F (37 C) (06/17 0500) Pulse Rate:  [69-84] 76  (06/17 0500) Resp:  [18-19] 18  (06/17 0500) BP: (105-122)/(60-78) 107/67 mmHg (06/17 0500) SpO2:  [95 %] 95 % (06/17 0500) Weight:  [264 lb 8.8 oz (120 kg)] 264 lb 8.8 oz (120 kg) (06/17 0500)  Intake/Output Summary (Last 24 hours) at 08/13/11 0808 Last data filed at 08/12/11 2315  Gross per 24 hour  Intake 1027.5 ml  Output      0 ml  Net 1027.5 ml   Physical exam deferred this AM as patient down for endoscopy.   Labs and imaging:   CBC  Lab 08/13/11 0908 08/12/11 1840 08/12/11 0830  WBC 6.7 9.1 9.0  HGB 9.2* 8.1* 7.6*  HCT 28.2* 25.1* 23.9*  PLT 236 235 240   BMET/CMET  Lab 08/12/11 0830 08/12/11 0136  NA -- 132*  K -- 4.3  CL -- 97  CO2 -- 21  BUN -- 35*  CREATININE -- 1.29*  CALCIUM -- 9.7  PROT 6.7 --  BILITOT <0.1* --  ALKPHOS 59 --  ALT 10 --  AST 12 --  GLUCOSE -- 226*   PROTIME-INR     Status: Abnormal   Collection Time   08/12/11  8:30 AM      Component Value Range   Prothrombin Time 22.1 (*) 11.6 - 15.2 seconds   INR 1.90 (*) 0.00 - 1.49   Dg Chest 2 View  08/12/2011  *RADIOLOGY REPORT*  Clinical Data: Dizziness, shortness of breath and chest pressure.  CHEST - 2 VIEW  Comparison: Chest radiograph performed 12/07/2010, and CT of the chest performed 03/12/2011  Findings: The lungs are well-aerated.  Mild focal right basilar airspace opacity is also characterized on the lateral view and raises concern for mild right lower lobe pneumonia.  There is no evidence of pleural effusion or pneumothorax.  The heart is mildly enlarged; the mediastinal contour is within normal limits.  No acute osseous abnormalities are seen.  IMPRESSION:  1.  Suspect mild right lower lobe pneumonia. 2.  Mild  cardiomegaly.  Original Report Authenticated By: Tonia Ghent, M.D.     Assessment  71 yo female on xarelto with history of gastritis here with GI bleed.   1. GI Bleed: Given history of melena and known history of gastritis, likely upper GI source.   -Transfused 2 units PRBC. AM Hgb 9.2 up from low of 7.6. Will continue to monitor q8 H+H with AM CBC tomorrow.    -GI plans for endoscopy today. Patient at study at this time.   -continue PPI  -discontinued Xarelto and all NSAIDs. Holding aspirin.   -interesting that INR elevated while on xarelto. Will repeat in AM as unclear origin given normal LFTs.   -VSS. Continue to monitor.   2. A. Fib: Rate controlled. Continue diltiazem and metoprolol. Xarelto being held 2/2 to problem #1.   -Will need to address anticoagulation once acute bleeding is controlled.   -perhaps could restart xarelto as patient no longer on nsaids if cause of bleed is gastritis  3. DOE: Likely related to anemia. CXR with possible PNA, although afebrile. Received 1 dose of Avelox. Need to follow up once patient up from endoscopy.   -Given chest pain as well, checked cardiac enzymes and negative x2  -  Has history of pulmonary nodule, due for 6 month f/u for this.    -will discuss with team obtaining while in house . RLL opacity different location than CT finding RUL  4. Acute Kidney injury: Cr up from baseline 0.7-0.8 to 1.29. May be due to hypoperfusion with drop in hgb.   -Received 2 units PRBC. Follow up BMET today  -held home HCTZ, spironalactone  5. DM: Will continue metformin. Hold sulfonylurea and liraglutide while NPO.  SSI. CBG typically <160 with outlier 183 this AM. Moderate control.   6. HTN: Continue home antihypertensives (dilt, metoprolol) for now. Holding HCTZ, spironalactone, amlodipine 7. Hypothyroidism: Continue synthroid at home dose.   FEN/GI: NPO for now, NS @ 152mL/hr. Minimal hyponatremia which corrects given glucose. PPX: PPI and SCD's in  setting of GI bleed  Dispo: Pending improvement and eval   Tana Conch, MD PGY1, Family Medicine Teaching Service [phone]  Seen and examined.  Feels much better today.  BP improved holding some meds.  Hgb up and Creat returned to normal.  No new CO.  Appreciate great GI help.  It is a bit odd that she has an elevated INR on Xaralto.  This raises the possibility of occult liver disease, which would be a surprise given her previously normal serial LFTs.    I would recheck an INR on her in the morning.  I would not restart Xaralto until INR < 1.5.  OK to restart 81 mg ASA.

## 2011-08-13 NOTE — Op Note (Signed)
[facility] [address] [place], [place]  [address]  ENDOSCOPY PROCEDURE REPORT  PATIENT:  Courtney Pena, Courtney Pena  MR#:  [id number] BIRTHDATE:  1940/08/06, 71 yrs. old  GENDER:  female  ENDOSCOPIST:  Iva Boop, MD, [facility] Referred by:  Doralee Albino, M.D.  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  The Pentax Gastroscope [id number] endoscope was introduced through the mouth and advanced to the second portion of the duodenum, without limitations.  The instrument was slowly withdrawn as the mucosa was fully examined. 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 Ablated with APC on setting of 40.  Otherwise the examination was normal.    Retroflexed views revealed no abnormalities.    The scope was then withdrawn from the patient and the procedure completed.  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 3) 81 mg ASA ok now 4) Will need outpatient follow-up with GI if anemia fails to resolve, more bleeding - needs regular Hgb checks to monitor anemia on chronic basis  REPEAT EXAM:  In for as needed.  Iva Boop, MD, [facility]  n. eSIGNED:   Iva Boop at 08/13/2011 10:38 AM  George Ina, [phone]

## 2011-08-13 NOTE — Progress Notes (Signed)
Utilization review completed.  

## 2011-08-14 ENCOUNTER — Encounter (HOSPITAL_COMMUNITY): Payer: Self-pay | Admitting: Internal Medicine

## 2011-08-14 LAB — GLUCOSE, CAPILLARY
Glucose-Capillary: 224 mg/dL — ABNORMAL HIGH (ref 70–99)
Glucose-Capillary: 200 mg/dL — ABNORMAL HIGH (ref 70–99)

## 2011-08-14 LAB — PROTIME-INR
INR: 1.06 (ref 0.00–1.49)
Prothrombin Time: 14 seconds (ref 11.6–15.2)

## 2011-08-14 LAB — CBC
HCT: 30.1 % — ABNORMAL LOW (ref 36.0–46.0)
Hemoglobin: 9.6 g/dL — ABNORMAL LOW (ref 12.0–15.0)
MCH: 27.3 pg (ref 26.0–34.0)
MCHC: 31.9 g/dL (ref 30.0–36.0)
MCV: 85.5 fL (ref 78.0–100.0)
Platelets: 281 10*3/uL (ref 150–400)
RBC: 3.52 MIL/uL — ABNORMAL LOW (ref 3.87–5.11)
RDW: 17.2 % — ABNORMAL HIGH (ref 11.5–15.5)
WBC: 7.9 10*3/uL (ref 4.0–10.5)

## 2011-08-14 LAB — COMPREHENSIVE METABOLIC PANEL
ALT: 11 U/L (ref 0–35)
AST: 13 U/L (ref 0–37)
Albumin: 3.4 g/dL — ABNORMAL LOW (ref 3.5–5.2)
Alkaline Phosphatase: 66 U/L (ref 39–117)
BUN: 15 mg/dL (ref 6–23)
CO2: 22 mEq/L (ref 19–32)
Calcium: 9.4 mg/dL (ref 8.4–10.5)
Chloride: 105 mEq/L (ref 96–112)
Creatinine, Ser: 0.87 mg/dL (ref 0.50–1.10)
GFR calc Af Amer: 76 mL/min — ABNORMAL LOW (ref >90)
GFR calc non Af Amer: 65 mL/min — ABNORMAL LOW (ref >90)
Glucose, Bld: 236 mg/dL — ABNORMAL HIGH (ref 70–99)
Potassium: 4.4 mEq/L (ref 3.5–5.1)
Sodium: 139 mEq/L (ref 135–145)
Total Bilirubin: 0.2 mg/dL — ABNORMAL LOW (ref 0.3–1.2)
Total Protein: 7 g/dL (ref 6.0–8.3)

## 2011-08-14 MED ORDER — ASPIRIN 81 MG PO TABS: 81.0000 mg | ORAL_TABLET | Freq: Every day | ORAL | Status: DC

## 2011-08-14 MED ORDER — RIVAROXABAN 20 MG PO TABS: 20.0000 mg | ORAL_TABLET | Freq: Every day | ORAL | Status: DC

## 2011-08-14 NOTE — Progress Notes (Signed)
Agree with Ms. Gribbin's assessment and plan. Nelly Scriven E. Abrian Hanover, MD, FACG  

## 2011-08-14 NOTE — Discharge Summary (Signed)
Family Medicine Teaching Service  Discharge Note : Attending Nikhil Osei MD Pager [phone] Office [phone] I have seen and examined this patient, reviewed their chart and discussed discharge planning wit the resident at the time of discharge. I agree with the discharge plan as above.  

## 2011-08-14 NOTE — Care Management Note (Signed)
    Page 1 of 1   08/14/2011     2:20:36 PM   CARE MANAGEMENT NOTE 08/14/2011  Patient:  Courtney Pena, Courtney Pena   Account Number:  [id number]  Date Initiated:  08/13/2011  Documentation initiated by:  Donn Pierini  Subjective/Objective Assessment:   Pt admitted with GIB     Action/Plan:   PTA pt lived at home with spouse, EGD today   Anticipated DC Date:  08/15/2011   Anticipated DC Plan:  HOME/SELF CARE      DC Planning Services  CM consult      Choice offered to / List presented to:             Status of service:  Completed, signed off Medicare Important Message given?   (If response is "NO", the following Medicare IM given date fields will be blank) Date Medicare IM given:   Date Additional Medicare IM given:    Discharge Disposition:  HOME/SELF CARE  Per UR Regulation:  Reviewed for med. necessity/level of care/duration of stay  If discussed at Long Length of Stay Meetings, dates discussed:    Comments:  PCP- Hensel,  08/14/11- 1300- Donn Pierini RN,BSN [phone] Pt for d/c today, no d/c needs.  08/13/11- 1600- Donn Pierini RN, BSN [phone] UR completed, NCM to follow for potential d/c needs

## 2011-08-14 NOTE — Progress Notes (Signed)
[facility] Daily Rounding Note 08/14/2011, 8:13 AM  SUBJECTIVE:    Pleasant.  Not dizzy.  Feels stronger.  No BMs.     OBJECTIVE:        General: Looks well     Vital signs in last 24 hours:    Temp:  [97.9 F (36.6 C)-98.5 F (36.9 C)] 98 F (36.7 C) (06/18 0500) Pulse Rate:  [69-93] 82  (06/18 0500) Resp:  [12-20] 18  (06/18 0500) BP: (96-166)/(62-111) 112/66 mmHg (06/18 0500) SpO2:  [91 %-100 %] 96 % (06/18 0500) Last BM Date: 08/13/11  Heart: RRR.  No MRG, but sounds are distant Chest: Clear.  No cough or SOB Abdomen: soft, obese, NT, active BS  Extremities: no pedal edema Neuro/Psych:  Pleasant.  No confusion. Relaxed. No gross deficits.   Lab Results:  Basename 08/13/11 1724 08/13/11 1303 08/13/11 0908 08/12/11 1840 08/12/11 0830  WBC -- -- 6.7 9.1 9.0  HGB 9.4* 9.0* 9.2* -- --  HCT 29.6* 28.5* 28.2* -- --  PLT -- -- 236 235 240   BMET  Basename 08/13/11 0908 08/12/11 0136  NA 139 132*  K 4.3 4.3  CL 106 97  CO2 21 21  GLUCOSE 192* 226*  BUN 16 35*  CREATININE 0.85 1.29*  CALCIUM 9.1 9.7   LFT  Basename 08/12/11 0830  PROT 6.7  ALBUMIN 3.2*  AST 12  ALT 10  ALKPHOS 59  BILITOT <0.1*  BILIDIR <0.1  IBILI NOT CALCULATED   PT/INR  Basename 08/12/11 0830  LABPROT 22.1*  INR 1.90*   Studies/Results: Ct Chest Wo Contrast 08/13/2011  *RADIOLOGY REPORT*  Clinical Data: Evaluate pulmonary nodule  CT CHEST WITHOUT CONTRAST  Technique:  Multidetector CT imaging of the chest was performed following the standard protocol without IV contrast.  Comparison: 03/12/2011  Findings: No axillary or supraclavicular adenopathy. Calcifications within the LAD coronary artery noted.  No pericardial or pleural effusions.  Right upper lobe pulmonary artery measures 8 mm, image 16. Unchanged from previous exam.  No new or enlarging pulmonary nodules or masses.  Review of the visualized osseous structures is significant for mild thoracic spondylosis.  Limited imaging  through the upper abdomen is unremarkable.  IMPRESSION:  1.  No acute cardiopulmonary abnormalities. 2.  Stable pulmonary nodule in the right upper lobe measuring 8 mm. If the patient is at increased risk for lung cancer then the next follow-up exam should be in 9-12 months.  If the patient is at low risk for lung cancer then the next follow-up exam should be in 18- 24 months. This recommendation follows the consensus statement: Guidelines for Management of Small Pulmonary Nodules Detected on CT Scans:  A Statement from the Fleischner Society as published in Radiology 2005; 237:395-400.  Original Report Authenticated By: Rosealee Albee, M.D.   US Abdomen Complete 08/13/2011  *RADIOLOGY REPORT*  Clinical Data:  Cirrhosis  COMPLETE ABDOMINAL ULTRASOUND  Comparison:  10/04/2008 ultrasound, 08/13/2011 chest CT, 06/06/2006 abdominal CT  Findings:  Gallbladder:  No gallstones, gallbladder wall thickening, or pericholecystic fluid.  Common bile duct:  Measures 5 mm.  Within normal limits.  Liver:  Heterogeneous echogenicity. No overt cirrhotic changes. No focal abnormality.  IVC:  Appears normal.  Pancreas:  No focal abnormality seen.  Spleen:  Measures 10.2 cm.  No focal abnormality.  Right Kidney:  Measures 11.7 cm.  No hydronephrosis.  There is a 1.2 cm echogenic lesion within the upper pole.  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  Left Kidney:  Measures up to 13 cm in diameter.  No hydronephrosis or focal abnormality.  Abdominal aorta:  No aneurysm identified.  The bifurcation is obscured by overlying bowel gas artifact.  Where seen, measures up to 0.8 cm in diameter.  IMPRESSION: Heterogeneous liver echogenicity is nonspecific pattern that can be seen in the setting steatosis, hepatitis, or early fibrosis. Subtle lesions will be obscured in thissetting. No overt cirrhotic changes.  Original Report Authenticated By: Waneta Martins, M.D.    ASSESMENT: *  Melena, anemia in pt on  Xarelto and known GAVE. 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 *  A fib.  Rate controlled. On Xarelto PTA. *  Hepatic heterogeneity, suspect fatty liver disease.  She is obese with BMI 43.5, 120 kgs.  *  ABL anemia.  Improved.  S/P transfusion PRBC x 2.  On po Iron at home. *  Gastritis and possible ulcer at pyloric channel April 2012.  No ulcer seen 6/17.  On daily Protonix now and at home.   PRN otc Prilosec also on home med list.  Note Diclofenac listed as prn home med but only used this once per month. .  *  Stable right UL lung nodule.  *  Elevated PT/INR.  Normal LFTs. *  ARF, resolved.  *   Type 2 DM, no insulin PTA.  PLAN: *  Will need outpatient follow-up with GI if anemia fails to resolve, more bleeding - needs regular Hgb checks to monitor  anemia on chronic basis *  Resume Xarelto on 6/23 is ok.  81 mg ASA ok now *  Once daily PPI. *  Ok to use Diclofenac once or twice per month.  Even once a week would be ok.       LOS: 2 days   Courtney Pena  08/14/2011, 8:13 AM Pager: [phone]

## 2011-08-14 NOTE — Discharge Summary (Signed)
Physician Discharge Summary  Patient ID: Courtney Pena MRN: [id number] DOB: 1940/03/28 Age: 71 y.o.  Admit date: 08/12/2011 Discharge date: 08/14/2011  PCP: Sanjuana Letters, MD of [facility]  Consultants:[facility]   Discharge Diagnosis: Active Problems:  GI bleeding  Anemia due to GI blood loss  Gastric antral vascular ectasia    Hospital Course  71 yo female with a past medical history of atrial fibrillation on xarelto and history of gastritis here with GI bleed.   1. GI Bleed: Given history of melena and known history of gastritis, likely upper GI source.   -at time of admission transfused 2 units PRBC which brought Hgb to 9.2 up from low of 7.6. Hgb stable at >9 throughout remainder of hospitalization.   -Patient was continued on high dose PPI during hospitalization as well.   -discontinued Xarelto, aspirin and all NSAIDs initially.   -GI consulted. 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 Argon Plasma Coagulator used to ablate the lesions. Operative report available in Epic.    -no follow up with GI required unless anemia worsens or patient has more bleeding. Needs regular Hgb monitoring. GI would consider capsule endoscopy if recurrent.   -Patient to restart Xarelto and Aspirin on 08/19/11. May start Aspirin sooner if advised by PCP.   2. Elevated INR-patient not on coumadin but was noted to have an INR of approximately 1.9 at time of admission. For this reason and links of GAVE with cirrhosis. LFTs were WNL.  Obtained abdominal ultrasound which did not show overt cirrhosis. On following day, PT/INR repeated and INR was 1.06. The spurious elevation in INR was thought to possible due to lab error or acute hemostatic changes during a bleed.   3. A. Fib: Rate controlled. Continued diltiazem and metoprolol. Xarelto per problem #1.   4. DOE/chest pain. Patient symptomatic due to anemia. DOE and chest  pain resolved s/p blood transfusion. Monitored Cardiac enzymes and negative x 2. CXR with possible PNA, although afebrile. Received 1 dose of Avelox but this was not continued due to lack of cough, fever, respiratory symptoms.    5. History of pulmonary nodule-repeated chest CT which showed a stable pulmonary nodule (see report below). Given smoking history, follow up scan advised in 9-12 months.   6. HTN: Continued home antihypertensives (dilt, metoprolol) for now. Holding HCTZ, spironalactone, amlodipine due to AKI and hypotension at time of admission.   7. Acute Kidney injury: Cr up from baseline 0.7-0.8 to 1.29. Prerenal in etiology. Improved to baseline with blood transfusion.  Held home HCTZ, spironolactone, amlodipine.  8. DM:  continued metformin. Held sulfonylurea and liraglutide while NPO and placed patient on SSI. CBGs with moderate control. Restarted all home oral hypoglycemics at discharge.   9. Hypothyroidism: Continued synthroid at home dose.    Discharge Exam: Temp:  [97.9 F (36.6 C)-98.5 F (36.9 C)] 98 F (36.7 C) (06/18 0500) Pulse Rate:  [78-98] 98  (06/18 1017) Resp:  [18] 18  (06/18 0500) BP: (96-145)/(62-85) 141/85 mmHg (06/18 1017) SpO2:  [95 %-97 %] 96 % (06/18 0500) Constitutional: She is oriented to person, place, and time. Obese female, no distress. Head: Normocephalic and atraumatic.  Mouth/Throat: Oropharynx is clear and moist.  Eyes: Conjunctivae and EOM are normal. Pupils are equal, round, and reactive to light. No scleral icterus.  Neck: Neck supple. No thyromegaly present.  Cardiovascular: irregularly irregular rhythm, no murmurs, rubs or gallops.  Respiratory: Effort normal and breath sounds normal.  No respiratory distress. She has no wheezes.  GI: Soft. Bowel sounds are normal. She exhibits no distension. There is no tenderness. There is no guarding.  Musculoskeletal: She exhibits no edema and no tenderness.  Neurological: She is alert and oriented to  person, place, and time.      Procedures/Imaging:  Dg Chest 2 View  08/12/2011  *RADIOLOGY REPORT*  Clinical Data: Dizziness, shortness of breath and chest pressure.  CHEST - 2 VIEW  Comparison: Chest radiograph performed 12/07/2010, and CT of the chest performed 03/12/2011  Findings: The lungs are well-aerated.  Mild focal right basilar airspace opacity is also characterized on the lateral view and raises concern for mild right lower lobe pneumonia.  There is no evidence of pleural effusion or pneumothorax.  The heart is mildly enlarged; the mediastinal contour is within normal limits.  No acute osseous abnormalities are seen.  IMPRESSION:  1.  Suspect mild right lower lobe pneumonia. 2.  Mild cardiomegaly.  Original Report Authenticated By: Tonia Ghent, M.D.   Ct Chest Wo Contrast  08/13/2011  *RADIOLOGY REPORT*  Clinical Data: Evaluate pulmonary nodule  CT CHEST WITHOUT CONTRAST  Technique:  Multidetector CT imaging of the chest was performed following the standard protocol without IV contrast.  Comparison: 03/12/2011  Findings: No axillary or supraclavicular adenopathy. Calcifications within the LAD coronary artery noted.  No pericardial or pleural effusions.  Right upper lobe pulmonary artery measures 8 mm, image 16. Unchanged from previous exam.  No new or enlarging pulmonary nodules or masses.  Review of the visualized osseous structures is significant for mild thoracic spondylosis.  Limited imaging through the upper abdomen is unremarkable.  IMPRESSION:  1.  No acute cardiopulmonary abnormalities. 2.  Stable pulmonary nodule in the right upper lobe measuring 8 mm. If the patient is at increased risk for lung cancer then the next follow-up exam should be in 9-12 months.  If the patient is at low risk for lung cancer then the next follow-up exam should be in 18- 24 months. This recommendation follows the consensus statement: Guidelines for Management of Small Pulmonary Nodules Detected on CT Scans:   A Statement from the Fleischner Society as published in Radiology 2005; 237:395-400.  Original Report Authenticated By: Rosealee Albee, M.D.   US Abdomen Complete  08/13/2011  *RADIOLOGY REPORT*  Clinical Data:  Cirrhosis  COMPLETE ABDOMINAL ULTRASOUND  Comparison:  10/04/2008 ultrasound, 08/13/2011 chest CT, 06/06/2006 abdominal CT  Findings:  Gallbladder:  No gallstones, gallbladder wall thickening, or pericholecystic fluid.  Common bile duct:  Measures 5 mm.  Within normal limits.  Liver:  Heterogeneous echogenicity. No overt cirrhotic changes. No focal abnormality.  IVC:  Appears normal.  Pancreas:  No focal abnormality seen.  Spleen:  Measures 10.2 cm.  No focal abnormality.  Right Kidney:  Measures 11.7 cm.  No hydronephrosis.  There is a 1.2 cm echogenic lesion within the upper pole.  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  Left Kidney:  Measures up to 13 cm in diameter.  No hydronephrosis or focal abnormality.  Abdominal aorta:  No aneurysm identified.  The bifurcation is obscured by overlying bowel gas artifact.  Where seen, measures up to 0.8 cm in diameter.  IMPRESSION: Heterogeneous liver echogenicity is nonspecific pattern that can be seen in the setting steatosis, hepatitis, or early fibrosis. Subtle lesions will be obscured in thissetting. No overt cirrhotic changes.  Original Report Authenticated By: Waneta Martins, M.D.    Labs  CBC  Lab  08/14/11 0800 08/13/11 1724 08/13/11 1303 08/13/11 0908 08/12/11 1840  WBC 7.9 -- -- 6.7 9.1  HGB 9.6* 9.4* 9.0* -- --  HCT 30.1* 29.6* 28.5* -- --  PLT 281 -- -- 236 235   BMET  Lab 08/14/11 0800 08/13/11 0908 08/12/11 0830 08/12/11 0136  NA 139 139 -- 132*  K 4.4 4.3 -- 4.3  CL 105 106 -- 97  CO2 22 21 -- 21  BUN 15 16 -- 35*  CREATININE 0.87 0.85 -- 1.29*  CALCIUM 9.4 9.1 -- 9.7  PROT 7.0 -- 6.7 --  BILITOT 0.2* -- <0.1* --  ALKPHOS 66 -- 59 --  ALT 11 -- 10 --  AST 13 -- 12 --  GLUCOSE 236* 192* -- 226*    PRO B NATRIURETIC PEPTIDE     Status: Abnormal   Collection Time   08/12/11  1:36 AM      Component Value Range Comment   Pro B Natriuretic peptide (BNP) 155.0 (*) 0 - 125 pg/mL   PROTIME-INR     Status: Abnormal   Collection Time   08/12/11  8:30 AM      Component Value Range Comment   Prothrombin Time 22.1 (*) 11.6 - 15.2 seconds    INR 1.90 (*) 0.00 - 1.49   PROTIME-INR     Status: Normal   Collection Time   08/14/11  8:00 AM      Component Value Range Comment   Prothrombin Time 14.0  11.6 - 15.2 seconds    INR 1.06  0.00 - 1.49   COMPREHENSIVE METABOLIC PANEL     Status: Abnormal   Collection Time   08/14/11  8:00 AM      Component Value Range Comment   Sodium 139  135 - 145 mEq/L    Potassium 4.4  3.5 - 5.1 mEq/L    Chloride 105  96 - 112 mEq/L    CO2 22  19 - 32 mEq/L    Glucose, Bld 236 (*) 70 - 99 mg/dL    BUN 15  6 - 23 mg/dL    Creatinine, Ser 1.61  0.50 - 1.10 mg/dL    Calcium 9.4  8.4 - 09.6 mg/dL    Total Protein 7.0  6.0 - 8.3 g/dL    Albumin 3.4 (*) 3.5 - 5.2 g/dL    AST 13  0 - 37 U/L    ALT 11  0 - 35 U/L    Alkaline Phosphatase 66  39 - 117 U/L    Total Bilirubin 0.2 (*) 0.3 - 1.2 mg/dL    GFR calc non Af Amer 65 (*) >90 mL/min    GFR calc Af Amer 76 (*) >90 mL/min        Patient condition at time of discharge/disposition: stable  Disposition-home   Follow up issues: 1. Follow up Hgb at regular intervals. Does not need to see GI again unless hgb drops or does not continue to improve on iron.  2. Please consider restarting aspirin sooner. We advised patient to restart on 08/19/11 but GI thought restarting at time of discharge was acceptable.  3. Please continue to advise patient to minimize NSAIDs or not use at all. GI has said that diclofenac up to once a week would be acceptable but given risks, we advised patient not to continue NSAID use.  4. Consider repeat PT/INR given acute elevation which did not recur in hospital.  5. History of pulmonary  nodule-repeated chest CT which showed a  stable pulmonary nodule 8mm in size. Given smoking history, follow up scan advised in 9-12 months.  6. Follow up blood pressure. Held HCTZ, amlodipine, and spironolactone. Please restart as you see appropriate if BP tolerating. On night before discharge had a BP of approximately 90/60 so did not restart before discharge.   Discharge follow up:  Follow-up Information    Follow up with Sanjuana Letters, MD on 08/15/2011. (4 pm)    Contact information:   [address] [place] [address] [phone]         Discharge Orders    Future Appointments: Provider: Department: Dept Phone: Center:   08/15/2011 4:00 PM Sanjuana Letters, MD [facility] [phone] [facility]     Future Orders Please Complete By Expires   Diet - low sodium heart healthy      Increase activity slowly         Discharge Medications Medication List  As of 08/14/2011 11:46 AM   CHANGE how you take these medications         aspirin 81 MG tablet   Take 1 tablet (81 mg total) by mouth daily. (at bedtime) Do not take until 08/19/11 then take daily unless instructed otherwise by Dr. Leveda Anna.   What changed: doctor's instructions      Rivaroxaban 20 MG Tabs   Take 20 mg by mouth daily. With evening meal. Do not take until 08/19/11 then take daily.   What changed: doctor's instructions         CONTINUE taking these medications         acetaminophen 500 MG tablet   Commonly known as: TYLENOL      diltiazem 180 MG 24 hr capsule   Commonly known as: CARDIZEM CD   Take 1 capsule (180 mg total) by mouth daily.      FERREX 150 150 MG capsule   Generic drug: iron polysaccharides   TAKE 1 CAPSULE BY MOUTH TWICE DAILY      glipiZIDE 10 MG 24 hr tablet   Commonly known as: GLUCOTROL XL   Take 1 tablet (10 mg total) by mouth daily.      levothyroxine 125 MCG tablet   Commonly known as: SYNTHROID, LEVOTHROID   Take 1 tablet (125 mcg total) by mouth  daily.      Liraglutide 18 MG/3ML Soln   Inject 0.2 mLs (1.2 mg total) into the skin daily before lunch. After a snack      metFORMIN 1000 MG tablet   Commonly known as: GLUCOPHAGE   TAKE ONE TABLET BY MOUTH TWICE DAILY WITH MEALS      metoprolol succinate 100 MG 24 hr tablet   Commonly known as: TOPROL-XL   TAKE 1 TABLET DAILY      omeprazole 40 MG capsule   Commonly known as: PRILOSEC      propranolol 10 MG tablet   Commonly known as: INDERAL      Vitamin D3 1000 UNITS Caps         STOP taking these medications         amLODipine 10 MG tablet      DICLOFENAC POTASSIUM PO      hydrochlorothiazide 25 MG tablet      spironolactone 25 MG tablet          Where to get your medications       Information on where to get these meds is not yet available. Ask your nurse or doctor.  aspirin 81 MG tablet   Rivaroxaban 20 MG Tabs              Tana Conch, MD of [facility] 08/14/2011 11:45 AM

## 2011-08-15 ENCOUNTER — Ambulatory Visit (INDEPENDENT_AMBULATORY_CARE_PROVIDER_SITE_OTHER): Payer: 59 | Admitting: Family Medicine

## 2011-08-15 ENCOUNTER — Encounter: Payer: Self-pay | Admitting: Family Medicine

## 2011-08-15 VITALS — BP 132/82 | HR 72 | Temp 98.4°F | Wt 265.2 lb

## 2011-08-15 DIAGNOSIS — I10 Essential (primary) hypertension: Secondary | ICD-10-CM

## 2011-08-15 DIAGNOSIS — K31819 Angiodysplasia of stomach and duodenum without bleeding: Secondary | ICD-10-CM

## 2011-08-15 NOTE — Patient Instructions (Addendum)
Please take all medicines except Xaralto and amlodipine That means you should restart aspirin 81 mg and OK to restart your HCTZ Keep an eye on your blood pressure and let me know if it goes either high or low.   See me in three weeks.  I will recheck your blood count and probably restart your Xaralto at that time. If you have any bleeding, any time, stop the aspirin and xaralto, let me know and I will have you swallow the camera.

## 2011-08-16 NOTE — Assessment & Plan Note (Signed)
Restart ASA, hold xaralto for three weeks.

## 2011-08-16 NOTE — Assessment & Plan Note (Signed)
Restart all but amlodipine.

## 2011-08-16 NOTE — Progress Notes (Signed)
  Subjective:    Patient ID: Courtney Pena, female    DOB: Apr 14, 1940, 71 y.o.   MRN: [id number]  HPI FU hospitalization.  Seen for GI bleed on Xaralto and aspirin.  Had EGD and for to have gastric antral ectasias.  Cauterized and OK with GI to restart meds.   Also had low blood pressure and several meds held.  No longer lightheaded.   No vomiting, no dark stools.  Just DCed yesterday    Review of Systems     Objective:   Physical Exam Lungs clear Abd benign.       Assessment & Plan:

## 2011-09-05 ENCOUNTER — Encounter: Payer: Self-pay | Admitting: Family Medicine

## 2011-09-05 ENCOUNTER — Ambulatory Visit (INDEPENDENT_AMBULATORY_CARE_PROVIDER_SITE_OTHER): Payer: 59 | Admitting: Family Medicine

## 2011-09-05 VITALS — BP 122/70 | HR 72 | Temp 98.1°F | Wt 265.4 lb

## 2011-09-05 DIAGNOSIS — I480 Paroxysmal atrial fibrillation: Secondary | ICD-10-CM

## 2011-09-05 DIAGNOSIS — I10 Essential (primary) hypertension: Secondary | ICD-10-CM

## 2011-09-05 DIAGNOSIS — E7800 Pure hypercholesterolemia, unspecified: Secondary | ICD-10-CM

## 2011-09-05 DIAGNOSIS — D500 Iron deficiency anemia secondary to blood loss (chronic): Secondary | ICD-10-CM

## 2011-09-05 DIAGNOSIS — I4891 Unspecified atrial fibrillation: Secondary | ICD-10-CM

## 2011-09-05 DIAGNOSIS — E1165 Type 2 diabetes mellitus with hyperglycemia: Secondary | ICD-10-CM

## 2011-09-05 LAB — BASIC METABOLIC PANEL
BUN: 24 mg/dL — ABNORMAL HIGH (ref 6–23)
CO2: 24 mEq/L (ref 19–32)
Calcium: 10.2 mg/dL (ref 8.4–10.5)
Chloride: 101 mEq/L (ref 96–112)
Creat: 0.78 mg/dL (ref 0.50–1.10)
Glucose, Bld: 171 mg/dL — ABNORMAL HIGH (ref 70–99)
Potassium: 4.7 mEq/L (ref 3.5–5.3)
Sodium: 135 mEq/L (ref 135–145)

## 2011-09-05 LAB — POCT GLYCOSYLATED HEMOGLOBIN (HGB A1C): Hemoglobin A1C: 8

## 2011-09-05 LAB — POCT HEMOGLOBIN: Hemoglobin: 9.8 g/dL — AB (ref 12.2–16.2)

## 2011-09-05 MED ORDER — PRAVASTATIN SODIUM 40 MG PO TABS: 40.0000 mg | ORAL_TABLET | Freq: Every evening | ORAL | Status: DC

## 2011-09-06 NOTE — Assessment & Plan Note (Signed)
Not improving yet, continue iron, stay off xaralto.  Recheck in one month.

## 2011-09-06 NOTE — Progress Notes (Signed)
  Subjective:    Patient ID: Courtney Pena, female    DOB: 01-06-1941, 71 y.o.   MRN: [id number]  HPI  FU GI bleed on xaralto.  No further gross bleeding.  On iron.  Hgb is not rising yet.   DM more serious about wt loss.  A1C is coming down slowly. She is finally willing to retry statin for known high LDL.  She has been intollerant to mult statins in the past due to myalgias. Now back to normal with exercise tolerance.   Review of Systems     Objective:   Physical Exam Lungs clear Cardiac, irregular - unclear by exam if rate controled A fib or RSR with PACs Abd benign Trace edema        Assessment & Plan:

## 2011-09-06 NOTE — Assessment & Plan Note (Signed)
Start low potency statin.

## 2011-09-06 NOTE — Assessment & Plan Note (Signed)
Stable off amlodipine.  Will DC and follow.

## 2011-09-06 NOTE — Assessment & Plan Note (Signed)
Remain off xaralto for another month due to still low Hgb.  Stay on ASA

## 2011-09-06 NOTE — Patient Instructions (Signed)
Stay off xaralto.   See me in one month. Keep working on weight loss. Stay off amlodipine. I sent a new prescription for a cholesterol medication.

## 2011-09-06 NOTE — Assessment & Plan Note (Signed)
Slow improvement.  Still not at goal

## 2011-09-13 ENCOUNTER — Ambulatory Visit (INDEPENDENT_AMBULATORY_CARE_PROVIDER_SITE_OTHER): Payer: 59 | Admitting: Nurse Practitioner

## 2011-09-13 ENCOUNTER — Encounter: Payer: Self-pay | Admitting: Nurse Practitioner

## 2011-09-13 VITALS — BP 146/90 | HR 72 | Ht 65.5 in | Wt 264.0 lb

## 2011-09-13 DIAGNOSIS — D500 Iron deficiency anemia secondary to blood loss (chronic): Secondary | ICD-10-CM

## 2011-09-13 DIAGNOSIS — I480 Paroxysmal atrial fibrillation: Secondary | ICD-10-CM

## 2011-09-13 DIAGNOSIS — I4891 Unspecified atrial fibrillation: Secondary | ICD-10-CM

## 2011-09-13 NOTE — Assessment & Plan Note (Signed)
She is in sinus today and was in sinus on her EKG from June. Her risk of stroke is elevated but given her recent bleeding, hemoglobin is still down and the potential for future bleeding, I am holding off on restarting the Xarelto for now. We will plan on seeing her back in 2 months to reevaluate. Patient is agreeable to this plan and will call if any problems develop in the interim.

## 2011-09-13 NOTE — Progress Notes (Signed)
George Ina Date of Birth: 02-Apr-1940 Medical Record #[id number]  History of Present Illness: Courtney Pena is seen today for a work in visit. She is seen for Dr. Eden Emms. She is a very pleasant 71 year old black female with multiple medical issues as outlined below. She has had PAF back in October. CHADS is 2 and CHADSvasc is 5. Does not appear that she has chronic atrial fib to me. Anticoagulation was warranted and she was placed on Xarelto back last year. Last seen here in February and was doing ok. By May/June she started feeling weak and tired. Was lightheaded. No syncope. Was hospitalized with a GI bleed. Xarelto was stopped. 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 She was told that there was possibility of future bleeding. She was transfused.   She comes in today. She is feeling better. Slowly getting stronger. She has seen her PCP recently. Blood count is still low. No bleeding that she is aware of. Is on just low dose aspirin. No palpitations. Question of restarting Xarelto is now raised.   Current Outpatient Prescriptions on File Prior to Visit  Medication Sig Dispense Refill  . acetaminophen (TYLENOL) 500 MG tablet Take 500-1,000 mg by mouth as needed. As needed for headaches or arthritis      . aspirin 81 MG tablet Take 1 tablet (81 mg total) by mouth daily. (at bedtime) Do not take until 08/19/11 then take daily unless instructed otherwise by Dr. Leveda Anna.  30 tablet    . Cholecalciferol (VITAMIN D3) 1000 UNITS CAPS Take 2 capsules by mouth daily.       Marland Kitchen diltiazem (CARDIZEM CD) 180 MG 24 hr capsule Take 1 capsule (180 mg total) by mouth daily.  90 capsule  3  . FERREX 150 150 MG capsule TAKE 1 CAPSULE BY MOUTH TWICE DAILY  60 capsule  2  . glipiZIDE (GLUCOTROL XL) 10 MG 24 hr tablet Take 1 tablet (10 mg total) by mouth daily.  90 tablet  3  . hydrochlorothiazide (HYDRODIURIL) 25 MG tablet Take 25 mg by mouth daily.       Marland Kitchen  levothyroxine (SYNTHROID, LEVOTHROID) 125 MCG tablet Take 1 tablet (125 mcg total) by mouth daily.  90 tablet  4  . Liraglutide 18 MG/3ML SOLN Inject 0.2 mLs (1.2 mg total) into the skin daily before lunch. After a snack  18 mL  3  . metFORMIN (GLUCOPHAGE) 1000 MG tablet TAKE ONE TABLET BY MOUTH TWICE DAILY WITH MEALS  180 tablet  3  . metoprolol succinate (TOPROL-XL) 100 MG 24 hr tablet TAKE 1 TABLET DAILY  90 tablet  2  . omeprazole (PRILOSEC) 40 MG capsule Take 40 mg by mouth as needed. For stomach       . ONE TOUCH ULTRA TEST test strip       . propranolol (INDERAL) 10 MG tablet Take 10 mg by mouth as needed. For flutter or rapid heart beating      . spironolactone (ALDACTONE) 25 MG tablet Take 25 mg by mouth daily.       . pravastatin (PRAVACHOL) 40 MG tablet Take 1 tablet (40 mg total) by mouth every evening.  30 tablet  11  . DISCONTD: omeprazole (PRILOSEC) 40 MG capsule Take 1 capsule (40 mg total) by mouth daily. For stomach  90 capsule  3    Allergies  Allergen Reactions  . Lisinopril Swelling    REACTION: swelling, may have had some breathing involvement.   Marland Kitchen  Losartan Potassium Swelling    REACTION: swelling - May have been some breathing.   Marland Kitchen Penicillins Shortness Of Breath and Swelling    Arm Swelling with Penicillin (Occurred in 1960s) Breathing - throat swelling with Amoxicillin (Occurred prior to 2002)  . Sulfamethoxazole Hives and Itching    "welps all over" immediately after dose  . Pioglitazone Swelling    REACTION: swelling all over body  . Nsaids     Past Medical History  Diagnosis Date  . Diabetes mellitus   . Allergy   . Arthritis   . Asthma   . GERD (gastroesophageal reflux disease)   . Hypertension   . Hyperlipidemia   . Neuromuscular disorder   . Hypothyroid   . Ulcer   . Sleep apnea     does not use CPAP  . Gastritis   . Hiatal hernia   . Obesity   . Hyperthyroidism   . Personal history of colonic polyps 06/06/2009    cecal polyp  . Atrial  flutter 12-07-2010    converted in ED with 300 mg flecainide  . Anemia due to GI blood loss 08/12/2011  . Gastric antral vascular ectasia     source for gi bleed in 07/2011 - Xarelto stopped  . CAD (coronary artery disease)     mild per cath in 2004; nonischemic Myoview in March 2012    Past Surgical History  Procedure Date  . Cataract extraction   . Carpal tunnel release 2003    bilateral  . Cardiac catheterization 1999&2004  . Esophagogastroduodenoscopy 08/13/2011    Procedure: ESOPHAGOGASTRODUODENOSCOPY (EGD);  Surgeon: Iva Boop, MD;  Location[facility] ENDOSCOPY;  Service: Endoscopy;  Laterality: N/A;    History  Smoking status  . Former Smoker -- 2.0 packs/day for 30 years  . Types: Cigarettes  . Quit date: 02/26/1993  Smokeless tobacco  . Never Used    History  Alcohol Use No    Family History  Problem Relation Age of Onset  . Heart disease Father     Review of Systems: The review of systems is per the HPI.  All other systems were reviewed and are negative.  Physical Exam: BP 146/90  Pulse 72  Ht 5' 5.5" (1.664 m)  Wt 264 lb (119.75 kg)  BMI 43.26 kg/m2 Patient is very pleasant and in no acute distress. She is morbidly obese. Skin is warm and dry. Color is normal.  HEENT is unremarkable. Normocephalic/atraumatic. PERRL. Sclera are nonicteric. Neck is supple. No masses. No JVD. Lungs are clear. Cardiac exam shows a regular rate and rhythm today. Abdomen is obese but soft. Extremities are full but without edema. Gait and ROM are intact. No gross neurologic deficits noted.   LABORATORY DATA: EKG today shows sinus rhythm. Her EKG in June was also sinus rhythm.   Lab Results  Component Value Date   WBC 7.9 08/14/2011   HGB 9.8* 09/05/2011   HCT 30.1* 08/14/2011   PLT 281 08/14/2011   GLUCOSE 171* 09/05/2011   CHOL 196 12/08/2010   TRIG 199* 12/08/2010   HDL 45 12/08/2010   LDLDIRECT 156* 11/22/2010   LDLCALC 111* 12/08/2010   ALT 11 08/14/2011   AST 13 08/14/2011    NA 135 09/05/2011   K 4.7 09/05/2011   CL 101 09/05/2011   CREATININE 0.78 09/05/2011   BUN 24* 09/05/2011   CO2 24 09/05/2011   TSH 2.026 02/02/2011   INR 1.06 08/14/2011   HGBA1C 8.0 09/05/2011     Assessment /  Plan:

## 2011-09-13 NOTE — Assessment & Plan Note (Signed)
She has had recent GI bleed. Hemoglobin has not returned to normal. Now just on low dose aspirin therapy. 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

## 2011-09-13 NOTE — Patient Instructions (Signed)
For now, stay on your current medicines  We are not going to restart the Xarelto at this time. You are in a regular rhythm today and were in June as well.   Monitor your heart rate and pulse at home  I will have Dr. Eden Emms see you in 2 months to recheck you.   Call the [facility] office at [phone] if you have any questions, problems or concerns.

## 2011-10-10 ENCOUNTER — Ambulatory Visit (INDEPENDENT_AMBULATORY_CARE_PROVIDER_SITE_OTHER): Payer: 59 | Admitting: Family Medicine

## 2011-10-10 ENCOUNTER — Encounter: Payer: Self-pay | Admitting: Family Medicine

## 2011-10-10 VITALS — BP 138/80 | HR 75 | Temp 97.5°F | Wt 264.9 lb

## 2011-10-10 DIAGNOSIS — I480 Paroxysmal atrial fibrillation: Secondary | ICD-10-CM

## 2011-10-10 DIAGNOSIS — L259 Unspecified contact dermatitis, unspecified cause: Secondary | ICD-10-CM

## 2011-10-10 DIAGNOSIS — L300 Nummular dermatitis: Secondary | ICD-10-CM | POA: Insufficient documentation

## 2011-10-10 DIAGNOSIS — E7800 Pure hypercholesterolemia, unspecified: Secondary | ICD-10-CM

## 2011-10-10 DIAGNOSIS — I4891 Unspecified atrial fibrillation: Secondary | ICD-10-CM

## 2011-10-10 DIAGNOSIS — D500 Iron deficiency anemia secondary to blood loss (chronic): Secondary | ICD-10-CM

## 2011-10-10 LAB — RETICULOCYTES
ABS Retic: 70 10*3/uL (ref 19.0–186.0)
RBC.: 4.12 MIL/uL (ref 3.87–5.11)
Retic Ct Pct: 1.7 % (ref 0.4–2.3)

## 2011-10-10 LAB — CBC
HCT: 33.4 % — ABNORMAL LOW (ref 36.0–46.0)
Hemoglobin: 10.6 g/dL — ABNORMAL LOW (ref 12.0–15.0)
MCH: 25.7 pg — ABNORMAL LOW (ref 26.0–34.0)
MCHC: 31.7 g/dL (ref 30.0–36.0)
MCV: 81.1 fL (ref 78.0–100.0)
Platelets: 279 10*3/uL (ref 150–400)
RBC: 4.12 MIL/uL (ref 3.87–5.11)
RDW: 16.9 % — ABNORMAL HIGH (ref 11.5–15.5)
WBC: 7.6 10*3/uL (ref 4.0–10.5)

## 2011-10-10 MED ORDER — TRIAMCINOLONE ACETONIDE 0.1 % EX OINT: TOPICAL_OINTMENT | Freq: Two times a day (BID) | CUTANEOUS | Status: DC

## 2011-10-10 MED ORDER — PRAVASTATIN SODIUM 40 MG PO TABS: 40.0000 mg | ORAL_TABLET | Freq: Every evening | ORAL | Status: DC

## 2011-10-10 NOTE — Progress Notes (Signed)
  Subjective:    Patient ID: Courtney Pena, female    DOB: [birth year], 71 y.o.   MRN: [id number]  HPI Recheck hgb on ASA.  Call cell phone with results.  No apparent GI bleed Cards checkup OK, in RSR.  They differed to me about Xaralto. Also, for some reason, Rx for pravastatin never got to pharmacy, resent.   Review of Systems     Objective:   Physical Exam  No conjunctival paleness Cardiac RRR without m or g Lungs clear.  Abd benign      Assessment & Plan:

## 2011-10-10 NOTE — Patient Instructions (Addendum)
Pick up the cholesterol med and skin ointment at the pharmacy. I will call with lab results Your last eye exam was 11/20/10 - you are about due for another.

## 2011-10-11 LAB — FERRITIN: Ferritin: 11 ng/mL (ref 10–291)

## 2011-10-11 NOTE — Assessment & Plan Note (Signed)
Recheck Hgb now on ASA

## 2011-10-11 NOTE — Assessment & Plan Note (Signed)
In RSR by exam, which is consistent with recent cards visit.

## 2011-10-11 NOTE — Assessment & Plan Note (Signed)
Resent Rx for pravastatin and will check lipids in three months.

## 2011-10-29 ENCOUNTER — Other Ambulatory Visit: Payer: Self-pay | Admitting: Family Medicine

## 2011-11-08 ENCOUNTER — Ambulatory Visit (INDEPENDENT_AMBULATORY_CARE_PROVIDER_SITE_OTHER): Payer: 59 | Admitting: *Deleted

## 2011-11-08 DIAGNOSIS — Z23 Encounter for immunization: Secondary | ICD-10-CM

## 2011-11-13 ENCOUNTER — Ambulatory Visit
Admission: RE | Admit: 2011-11-13 | Discharge: 2011-11-13 | Disposition: A | Discharge status: Home / Self Care | Payer: 59 | Source: Ambulatory Visit | Attending: Emergency Medicine | Admitting: Emergency Medicine

## 2011-11-13 ENCOUNTER — Other Ambulatory Visit: Payer: Self-pay | Admitting: Emergency Medicine

## 2011-11-13 DIAGNOSIS — M25579 Pain in unspecified ankle and joints of unspecified foot: Secondary | ICD-10-CM

## 2011-11-14 ENCOUNTER — Ambulatory Visit: Payer: 59 | Admitting: Cardiovascular Disease

## 2011-12-06 ENCOUNTER — Ambulatory Visit (INDEPENDENT_AMBULATORY_CARE_PROVIDER_SITE_OTHER): Payer: 59 | Admitting: Cardiovascular Disease

## 2011-12-06 ENCOUNTER — Encounter: Payer: Self-pay | Admitting: Cardiovascular Disease

## 2011-12-06 VITALS — BP 136/80 | HR 82 | Resp 18 | Ht 66.0 in | Wt 266.8 lb

## 2011-12-06 DIAGNOSIS — I4891 Unspecified atrial fibrillation: Secondary | ICD-10-CM

## 2011-12-06 DIAGNOSIS — D500 Iron deficiency anemia secondary to blood loss (chronic): Secondary | ICD-10-CM

## 2011-12-06 DIAGNOSIS — I480 Paroxysmal atrial fibrillation: Secondary | ICD-10-CM

## 2011-12-06 DIAGNOSIS — I10 Essential (primary) hypertension: Secondary | ICD-10-CM

## 2011-12-06 NOTE — Assessment & Plan Note (Signed)
Well controlled.  Continue current medications and low sodium Dash type diet.    

## 2011-12-06 NOTE — Assessment & Plan Note (Signed)
Stable with no angina and good activity level.  Continue medical Rx  

## 2011-12-06 NOTE — Assessment & Plan Note (Signed)
Cholesterol is at goal.  Continue current dose of statin and diet Rx.  No myalgias or side effects.  F/U  LFT's in 6 months. Lab Results  Component Value Date   LDLCALC 111* 12/08/2010             

## 2011-12-06 NOTE — Patient Instructions (Signed)
Your physician recommends that you schedule a follow-up appointment in:  3 MONTHS WITH  DR NISHAN  Your physician recommends that you continue on your current medications as directed. Please refer to the Current Medication list given to you today.  

## 2011-12-06 NOTE — Progress Notes (Signed)
Patient ID: Courtney Pena, female   DOB: 14-Aug-1940, 71 y.o.   MRN: [id number] Courtney Pena is seen today for a work in visit. She is seen for Dr. Eden Emms. She is a very pleasant 71 year old black female with multiple medical issues as outlined below. She has had PAF back in October. CHADS is 2 and CHADSvasc is 5. Does not appear that she has chronic atrial fib to me. Anticoagulation was warranted and she was placed on Xarelto back last year. Last seen here in February and was doing ok. By May/June she started feeling weak and tired. Was lightheaded. No syncope. Was hospitalized with a GI bleed. Xarelto was stopped. 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 She was told that there was possibility of future bleeding. She was transfused.  She comes in today. She is feeling better. Slowly getting stronger. She has seen her PCP recently. Blood count is still low. No bleeding that she is aware of. Is on just low dose aspirin. No palpitations. Question of restarting Xarelto is now raised.   ROS: Denies fever, malais, weight loss, blurry vision, decreased visual acuity, cough, sputum, SOB, hemoptysis, pleuritic pain, palpitaitons, heartburn, abdominal pain, melena, lower extremity edema, claudication, or rash.  All other systems reviewed and negative  General: Affect appropriate Healthy:  appears stated age HEENT: normal Neck supple with no adenopathy JVP normal no bruits no thyromegaly Lungs clear with no wheezing and good diaphragmatic motion Heart:  S1/S2 no murmur, no rub, gallop or click PMI normal Abdomen: benighn, BS positve, no tenderness, no AAA no bruit.  No HSM or HJR Distal pulses intact with no bruits No edema Neuro non-focal Skin warm and dry No muscular weakness   Current Outpatient Prescriptions  Medication Sig Dispense Refill  . acetaminophen (TYLENOL) 500 MG tablet Take 500-1,000 mg by mouth as needed. As needed for headaches or  arthritis      . aspirin 81 MG tablet Take 1 tablet (81 mg total) by mouth daily. (at bedtime) Do not take until 08/19/11 then take daily unless instructed otherwise by Dr. Leveda Anna.  30 tablet    . Cholecalciferol (VITAMIN D3) 1000 UNITS CAPS Take 2 capsules by mouth daily.       Marland Kitchen diltiazem (CARDIZEM CD) 180 MG 24 hr capsule Take 1 capsule (180 mg total) by mouth daily.  90 capsule  3  . FERREX 150 150 MG capsule TAKE 1 CAPSULE BY MOUTH TWICE DAILY  60 capsule  2  . glipiZIDE (GLUCOTROL XL) 10 MG 24 hr tablet Take 1 tablet (10 mg total) by mouth daily.  90 tablet  3  . hydrochlorothiazide (HYDRODIURIL) 25 MG tablet Take 25 mg by mouth daily.       Marland Kitchen levothyroxine (SYNTHROID, LEVOTHROID) 125 MCG tablet Take 1 tablet (125 mcg total) by mouth daily.  90 tablet  4  . Liraglutide 18 MG/3ML SOLN Inject 0.2 mLs (1.2 mg total) into the skin daily before lunch. After a snack  18 mL  3  . metFORMIN (GLUCOPHAGE) 1000 MG tablet TAKE ONE TABLET BY MOUTH TWICE DAILY WITH MEALS  180 tablet  3  . metoprolol succinate (TOPROL-XL) 100 MG 24 hr tablet TAKE 1 TABLET DAILY  90 tablet  2  . omeprazole (PRILOSEC) 40 MG capsule Take 40 mg by mouth as needed. For stomach       . ONE TOUCH ULTRA TEST test strip       . pravastatin (PRAVACHOL)  40 MG tablet Take 1 tablet (40 mg total) by mouth every evening.  30 tablet  11  . propranolol (INDERAL) 10 MG tablet Take 10 mg by mouth as needed. For flutter or rapid heart beating      . spironolactone (ALDACTONE) 25 MG tablet Take 25 mg by mouth daily.       Marland Kitchen triamcinolone ointment (KENALOG) 0.1 % Apply topically 2 (two) times daily.  90 g  3    Allergies  Lisinopril; Losartan potassium; Penicillins; Sulfamethoxazole; Pioglitazone; and Nsaids  Electrocardiogram:  NSR rate 81  PAC poor R wave progression  Assessment and Plan

## 2011-12-06 NOTE — Assessment & Plan Note (Signed)
Maint NSR  Showed her how to take her pulse.  Given lack of recurrence agree with PA that no anticoagulation is best for now given risk of repeat GI bleeding.  If afib recurs would use coumadin due to ease of reversal.

## 2011-12-06 NOTE — Assessment & Plan Note (Signed)
F/U Dr Leone Payor  Monitor stools [place] cards for any changes

## 2011-12-26 ENCOUNTER — Encounter: Payer: Self-pay | Admitting: Family Medicine

## 2011-12-26 ENCOUNTER — Ambulatory Visit (INDEPENDENT_AMBULATORY_CARE_PROVIDER_SITE_OTHER): Payer: 59 | Admitting: Family Medicine

## 2011-12-26 VITALS — BP 138/77 | HR 79 | Temp 97.8°F | Ht 65.5 in | Wt 263.6 lb

## 2011-12-26 DIAGNOSIS — I10 Essential (primary) hypertension: Secondary | ICD-10-CM

## 2011-12-26 DIAGNOSIS — E118 Type 2 diabetes mellitus with unspecified complications: Secondary | ICD-10-CM

## 2011-12-26 DIAGNOSIS — I4891 Unspecified atrial fibrillation: Secondary | ICD-10-CM

## 2011-12-26 DIAGNOSIS — M1990 Unspecified osteoarthritis, unspecified site: Secondary | ICD-10-CM

## 2011-12-26 DIAGNOSIS — D500 Iron deficiency anemia secondary to blood loss (chronic): Secondary | ICD-10-CM

## 2011-12-26 DIAGNOSIS — I480 Paroxysmal atrial fibrillation: Secondary | ICD-10-CM

## 2011-12-26 DIAGNOSIS — E1165 Type 2 diabetes mellitus with hyperglycemia: Secondary | ICD-10-CM

## 2011-12-26 LAB — IRON AND TIBC
%SAT: 13 % — ABNORMAL LOW (ref 20–55)
Iron: 63 ug/dL (ref 42–145)
TIBC: 474 ug/dL — ABNORMAL HIGH (ref 250–470)
UIBC: 411 ug/dL — ABNORMAL HIGH (ref 125–400)

## 2011-12-26 LAB — POCT GLYCOSYLATED HEMOGLOBIN (HGB A1C): Hemoglobin A1C: 10.6

## 2011-12-26 LAB — POCT HEMOGLOBIN: Hemoglobin: 10.5 g/dL — AB (ref 12.2–16.2)

## 2011-12-26 MED ORDER — TRAMADOL HCL 50 MG PO TABS: 50.0000 mg | ORAL_TABLET | Freq: Three times a day (TID) | ORAL | Status: DC | PRN

## 2011-12-26 NOTE — Assessment & Plan Note (Addendum)
Again, likely ongoing losses- confirmed with continued low iron.  Not concerned re malignancy but would not restart Xaralto or coumadin.

## 2011-12-26 NOTE — Assessment & Plan Note (Signed)
Very poor control due to dietary indiscretion.  She will buckle down

## 2011-12-26 NOTE — Patient Instructions (Addendum)
Do the poop cards Let me know about your eye exam Buckle down on the diet and start checking your blood sugar daily I will call with lab results See me in two months.  The diabetes needs to be better or insulin.  Yes, that is a threat.

## 2011-12-26 NOTE — Progress Notes (Signed)
  Subjective:    Patient ID: George Ina, female    DOB: 02/16/1941, 71 y.o.   MRN: [id number]  HPI Has back pain which is now running down both legs Lt > Rt. Call results to cell - OK to leave message. Doing OK.  Admits to dietary indiscretion.  A1C is elevated and show to be more careful Back pain is worse.  I am reluctant to continue voltarin.  Will switch to tramadol. Does not note any palpitations.  Off anticoag due to GI bleed.  Has been on iron long enough that she should be repleted unless ongoing loss Denies CP or SOB    Review of Systems     Objective:   Physical Exam Weight noted Lungs clear Cardiac RRR without m or g Lumbar spine paraspinous muscle tenderness Normal DTRs        Assessment & Plan:

## 2011-12-26 NOTE — Assessment & Plan Note (Signed)
She is reluctant to give up diclofenac because it works well.  I am concerned with previous GI bleed and continued low Hgb despite iron therapy.  She will try tramadol

## 2011-12-27 NOTE — Assessment & Plan Note (Signed)
No recent palpitations.  Continue ASA

## 2011-12-27 NOTE — Assessment & Plan Note (Signed)
Good control

## 2012-01-07 ENCOUNTER — Other Ambulatory Visit: Payer: Self-pay | Admitting: *Deleted

## 2012-01-07 DIAGNOSIS — E1165 Type 2 diabetes mellitus with hyperglycemia: Secondary | ICD-10-CM

## 2012-01-07 DIAGNOSIS — E039 Hypothyroidism, unspecified: Secondary | ICD-10-CM

## 2012-01-07 DIAGNOSIS — I10 Essential (primary) hypertension: Secondary | ICD-10-CM

## 2012-01-07 MED ORDER — GLIPIZIDE ER 10 MG PO TB24: 10.0000 mg | ORAL_TABLET | Freq: Every day | ORAL | Status: DC

## 2012-01-07 MED ORDER — HYDROCHLOROTHIAZIDE 25 MG PO TABS: 25.0000 mg | ORAL_TABLET | Freq: Every day | ORAL | Status: DC

## 2012-01-07 MED ORDER — SPIRONOLACTONE 25 MG PO TABS: 25.0000 mg | ORAL_TABLET | Freq: Every day | ORAL | Status: DC

## 2012-01-07 MED ORDER — LEVOTHYROXINE SODIUM 125 MCG PO TABS: 125.0000 ug | ORAL_TABLET | Freq: Every day | ORAL | Status: DC

## 2012-01-07 MED ORDER — METOPROLOL SUCCINATE ER 100 MG PO TB24: 100.0000 mg | ORAL_TABLET | Freq: Every day | ORAL | Status: DC

## 2012-01-07 MED ORDER — DILTIAZEM HCL ER COATED BEADS 180 MG PO CP24: 180.0000 mg | ORAL_CAPSULE | Freq: Every day | ORAL | Status: DC

## 2012-01-11 NOTE — Telephone Encounter (Addendum)
Patient calls stating none of the RX that Dr. Leveda Anna sent to [facility] were received. I called [facility] and gave all Rx verbally .   called and left message on patient's voicemail.

## 2012-02-13 ENCOUNTER — Other Ambulatory Visit: Payer: Self-pay | Admitting: Family Medicine

## 2012-02-13 MED ORDER — LIRAGLUTIDE 18 MG/3ML ~~LOC~~ SOLN: 1.2000 mg | Freq: Every day | SUBCUTANEOUS | Status: DC

## 2012-03-05 ENCOUNTER — Other Ambulatory Visit: Payer: Self-pay | Admitting: Family Medicine

## 2012-03-05 ENCOUNTER — Ambulatory Visit: Payer: 59 | Admitting: Cardiovascular Disease

## 2012-03-05 ENCOUNTER — Other Ambulatory Visit: Payer: Self-pay | Admitting: Physician Assistant

## 2012-03-11 ENCOUNTER — Encounter: Payer: Self-pay | Admitting: Physician Assistant

## 2012-03-11 ENCOUNTER — Ambulatory Visit (INDEPENDENT_AMBULATORY_CARE_PROVIDER_SITE_OTHER): Payer: 59 | Admitting: Physician Assistant

## 2012-03-11 VITALS — BP 142/82 | HR 82 | Ht 65.5 in | Wt 261.8 lb

## 2012-03-11 DIAGNOSIS — R002 Palpitations: Secondary | ICD-10-CM

## 2012-03-11 DIAGNOSIS — R079 Chest pain, unspecified: Secondary | ICD-10-CM

## 2012-03-11 DIAGNOSIS — R1310 Dysphagia, unspecified: Secondary | ICD-10-CM

## 2012-03-11 DIAGNOSIS — I4891 Unspecified atrial fibrillation: Secondary | ICD-10-CM

## 2012-03-11 DIAGNOSIS — I2510 Atherosclerotic heart disease of native coronary artery without angina pectoris: Secondary | ICD-10-CM

## 2012-03-11 MED ORDER — NITROGLYCERIN 0.4 MG SL SUBL: 0.4000 mg | SUBLINGUAL_TABLET | SUBLINGUAL | Status: DC | PRN

## 2012-03-11 NOTE — Patient Instructions (Addendum)
Your physician has requested that you have a lexiscan myoview DX 786.50. For further information please visit [email or web address] Please follow instruction sheet, as given.  Your physician has recommended that you wear an 21 DAY event monitor DX 785.1. Event monitors are medical devices that record the heart's electrical activity. Doctors most often [name] these monitors to diagnose arrhythmias. Arrhythmias are problems with the speed or rhythm of the heartbeat. The monitor is a small, portable device. You can wear one while you do your normal daily activities. This is usually used to diagnose what is causing palpitations/syncope (passing out).  LABS TODAY;BMET, CBC W/DIFF, TSH  AN RX HAS BEEN SENT IN FOR NITROGLYCERIN AND YOU HAVE BEEN ADVISED AS TO HOW AND WHEN TO USE NTG.  PLEASE FOLLOW UP WITH DR. Eden Emms IN ABOUT 6 WEEKS

## 2012-03-11 NOTE — Progress Notes (Signed)
[address] [place], [place]  [address] Phone: [phone], Fax:  [phone]  Date:  03/11/2012   Name:  Courtney Pena   DOB:  13-May-1940   MRN:  [id number]  PCP:  Sanjuana Letters, MD  Primary Cardiologist:  Dr. Charlton Haws  Primary Electrophysiologist:  None    History of Present Illness: Courtney Pena is a 72 y.o. female who returns for evaluation of chest pain.  She has a hx of non-obstructive CAD, paroxysmal atrial fibrillation/flutter in 11/2010, DM2, HTN, HL, hypothyroidism, asthma, GERD, sleep apnea. LHC 5/04: pLAD 60%, mLAD 40%, oCFX 30%, EF 55%. Myoview 2/12: Normal, no ischemia, EF 62%. Echo 5/09: EF 60%, no LV regional wall motion abnormalities. Patient was admitted 6/13 with upper GI bleeding requiring transfusion with 2 units PRBCs. 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  Xarelto was stopped. She was last seen by Dr. Eden Emms 10/13. She remained in sinus rhythm at that time. As patient demonstrated lack of recurrence of atrial fibrillation, it was decided to defer resuming anticoagulation. If she had recurrent atrial fibrillation, it was recommended that she be placed on Coumadin due to ease of reversal.   Over the last week, she has noted several episodes of left-sided chest discomfort below her breast as well as in her left shoulder and left jaw. It is sharp and squeezing. She thinks that she may notice it more with activity. She denies associated dyspnea, nausea, diaphoresis. She denies syncope or near-syncope. She does note chronic dyspnea with exertion. She probably describes class IIb-III symptoms. She denies orthopnea, PND or significant pedal edema. Of note, she does describe occasional rapid palpitations that remind her of her previous atrial fibrillation.  Labs (10/12):  LDL 111 Labs (12/12):  TSH 2.026 Labs (6/13):    K 4.4, creatinine 0.87, ALT 11,    Labs (7/13):    K 4.7, creatinine 0.78 Labs (10/13):  Hgb 10.5  Wt Readings from Last 3 Encounters:  03/11/12 261 lb 12.8 oz (118.752 kg)  12/26/11 263 lb 9.6 oz (119.568 kg)  12/06/11 266 lb 12.8 oz (121.02 kg)     Past Medical History  Diagnosis Date  . Diabetes mellitus   . Allergy   . Arthritis   . Asthma   . GERD (gastroesophageal reflux disease)   . Hypertension   . Hyperlipidemia   . Neuromuscular disorder   . Hypothyroid   . Ulcer   . Sleep apnea     does not use CPAP  . Gastritis   . Hiatal hernia   . Obesity   . Hyperthyroidism   . Personal history of colonic polyps 06/06/2009    cecal polyp  . Atrial flutter 12-07-2010    converted in ED with 300 mg flecainide  . Anemia due to GI blood loss 08/12/2011  . Gastric antral vascular ectasia     source for gi bleed in 07/2011 - Xarelto stopped  . CAD (coronary artery disease)     mild per cath in 2004; nonischemic Myoview in March 2012    Current Outpatient Prescriptions  Medication Sig Dispense Refill  . acetaminophen (TYLENOL) 500 MG tablet Take 500-1,000 mg by mouth as needed. As needed for headaches or arthritis      . aspirin 81 MG tablet Take 1 tablet (81 mg total) by mouth daily. (at bedtime) Do not take until 08/19/11 then take daily unless instructed otherwise by Dr. Leveda Anna.  30 tablet    .  Cholecalciferol (VITAMIN D3) 1000 UNITS CAPS Take 2 capsules by mouth daily.       Marland Kitchen diltiazem (CARDIZEM CD) 180 MG 24 hr capsule Take 1 capsule (180 mg total) by mouth daily.  90 capsule  3  . FERREX 150 150 MG capsule TAKE 1 CAPSULE BY MOUTH TWICE DAILY  60 capsule  2  . glipiZIDE (GLUCOTROL XL) 10 MG 24 hr tablet Take 1 tablet (10 mg total) by mouth daily.  90 tablet  3  . hydrochlorothiazide (HYDRODIURIL) 25 MG tablet Take 1 tablet (25 mg total) by mouth daily.  90 tablet  3  . levothyroxine (SYNTHROID, LEVOTHROID) 125 MCG tablet Take 1 tablet (125 mcg total) by mouth daily.  90 tablet  4  . Liraglutide 18 MG/3ML  SOLN Inject 0.2 mLs (1.2 mg total) into the skin daily before lunch. After a snack  18 mL  3  . metFORMIN (GLUCOPHAGE) 1000 MG tablet TAKE ONE TABLET BY MOUTH TWICE DAILY WITH MEALS  180 tablet  2  . metoprolol succinate (TOPROL-XL) 100 MG 24 hr tablet Take 1 tablet (100 mg total) by mouth daily. Take with or immediately following a meal.  90 tablet  2  . omeprazole (PRILOSEC) 40 MG capsule TAKE 1 CAPSULE (40 MG TOTAL) BY MOUTH DAILY. FOR STOMACH  90 capsule  2  . propranolol (INDERAL) 10 MG tablet TAKE 1 TABLET BY MOUTH 4 TIMES A DAY AS NEEDED  40 tablet  2  . spironolactone (ALDACTONE) 25 MG tablet Take 1 tablet (25 mg total) by mouth daily.  90 tablet  3  . traMADol (ULTRAM) 50 MG tablet Take 1 tablet (50 mg total) by mouth every 8 (eight) hours as needed for pain.  90 tablet  5  . triamcinolone ointment (KENALOG) 0.1 % Apply topically 2 (two) times daily.  90 g  3  . pravastatin (PRAVACHOL) 40 MG tablet Take 1 tablet (40 mg total) by mouth every evening.  30 tablet  11    Allergies: Allergies  Allergen Reactions  . Lisinopril Swelling    REACTION: swelling, may have had some breathing involvement.   . Losartan Potassium Swelling    REACTION: swelling - May have been some breathing.   Marland Kitchen Penicillins Shortness Of Breath and Swelling    Arm Swelling with Penicillin (Occurred in 1960s) Breathing - throat swelling with Amoxicillin (Occurred prior to 2002)  . Sulfamethoxazole Hives and Itching    "welps all over" immediately after dose  . Pioglitazone Swelling    REACTION: swelling all over body  . Nsaids     Social History:  The patient  reports that she quit smoking about 19 years ago. Her smoking use included Cigarettes. She has a 60 pack-year smoking history. She has never used smokeless tobacco. She reports that she does not drink alcohol or use illicit drugs.   ROS:  Please see the history of present illness.   She denies chest pain associated with meals. However, she does note some  dysphagia. She denies significant indigestion. She denies melena, hematochezia, hematemesis. She denies pleuritic chest pain. She denies recent travels or hospitalizations.    All other systems reviewed and negative.   PHYSICAL EXAM: VS:  BP 142/82  Pulse 82  Ht 5' 5.5" (1.664 m)  Wt 261 lb 12.8 oz (118.752 kg)  BMI 42.90 kg/m2 Well nourished, well developed, in no acute distress HEENT: normal Neck: no JVD Cardiac:  normal S1, S2; RRR; no murmur Lungs:  clear to auscultation  bilaterally, no wheezing, rhonchi or rales Abd: soft, nontender, no hepatomegaly Ext: no edema Skin: warm and dry Neuro:  CNs 2-12 intact, no focal abnormalities noted  EKG:  NSR, HR 83, inferior Q waves, poor R wave progression, PVCs, nonspecific ST-T wave changes, no significant change compared to prior tracings     ASSESSMENT AND PLAN:  1.  Chest Pain:  Typical and atypical features.  She has multiple risk factors including diabetes.  She had moderate CAD in 2004 and last myoview was over one year ago.  Will give her NTG to take PRN.  Arrange Lexiscan Myoview (she states she cannot walk on a treadmill).  Check BMET, CBC. 2. Coronary Artery Disease:  Continue ASA and statin.  Proceed with myoview as noted. 3. Atrial Fibrillation:  Maintaining NSR.  However, she is having a lot of palpitations.  Will arrange 21 day event monitor.  If she has a high AFib burden on her monitor, we may need to consider starting her back on anticoagulation (ie coumadin).  Check TSH. 4. Hypertension:  Controlled. 5. Dysphagia:  I have recommended she continue PPI Rx and to contact her GI for follow up.     Luna Glasgow, PA-C  3:48 PM 03/11/2012

## 2012-03-12 ENCOUNTER — Telehealth: Payer: Self-pay | Admitting: *Deleted

## 2012-03-12 DIAGNOSIS — E039 Hypothyroidism, unspecified: Secondary | ICD-10-CM

## 2012-03-12 DIAGNOSIS — Z961 Presence of intraocular lens: Secondary | ICD-10-CM | POA: Insufficient documentation

## 2012-03-12 DIAGNOSIS — E119 Type 2 diabetes mellitus without complications: Secondary | ICD-10-CM | POA: Insufficient documentation

## 2012-03-12 LAB — BASIC METABOLIC PANEL
BUN: 20 mg/dL (ref 6–23)
CO2: 25 mEq/L (ref 19–32)
Calcium: 9.6 mg/dL (ref 8.4–10.5)
Chloride: 103 mEq/L (ref 96–112)
Creatinine, Ser: 1 mg/dL (ref 0.4–1.2)
GFR: 67.76 mL/min (ref >60.00)
Glucose, Bld: 250 mg/dL — ABNORMAL HIGH (ref 70–99)
Potassium: 4.3 mEq/L (ref 3.5–5.1)
Sodium: 135 mEq/L (ref 135–145)

## 2012-03-12 LAB — CBC WITH DIFFERENTIAL/PLATELET
Basophils Absolute: 0.3 10*3/uL — ABNORMAL HIGH (ref 0.0–0.1)
Basophils Relative: 3.3 % — ABNORMAL HIGH (ref 0.0–3.0)
Eosinophils Absolute: 0.4 10*3/uL (ref 0.0–0.7)
Eosinophils Relative: 4.7 % (ref 0.0–5.0)
HCT: 33.7 % — ABNORMAL LOW (ref 36.0–46.0)
Hemoglobin: 11 g/dL — ABNORMAL LOW (ref 12.0–15.0)
Lymphocytes Relative: 26.4 % (ref 12.0–46.0)
Lymphs Abs: 2.1 10*3/uL (ref 0.7–4.0)
MCHC: 32.8 g/dL (ref 30.0–36.0)
MCV: 84.7 fl (ref 78.0–100.0)
Monocytes Absolute: 0.3 10*3/uL (ref 0.1–1.0)
Monocytes Relative: 3.5 % (ref 3.0–12.0)
Neutro Abs: 5 10*3/uL (ref 1.4–7.7)
Neutrophils Relative %: 62.1 % (ref 43.0–77.0)
Platelets: 258 10*3/uL (ref 150.0–400.0)
RBC: 3.98 Mil/uL (ref 3.87–5.11)
RDW: 16.5 % — ABNORMAL HIGH (ref 11.5–14.6)
WBC: 8.1 10*3/uL (ref 4.5–10.5)

## 2012-03-12 LAB — TSH: TSH: 0.26 u[IU]/mL — ABNORMAL LOW (ref 0.35–5.50)

## 2012-03-12 MED ORDER — LEVOTHYROXINE SODIUM 100 MCG PO TABS: 100.0000 ug | ORAL_TABLET | Freq: Every day | ORAL | Status: DC

## 2012-03-12 NOTE — Telephone Encounter (Signed)
Message copied by Tarri Fuller on Wed Mar 12, 2012  6:12 PM ------      Message from: [place], [place] T      Created: Wed Mar 12, 2012  1:54 PM       Renal function and potassium stable      Glucose elevated-followup with PCP      Hemoglobin stable      TSH low-decrease Synthroid to 100 mcg daily and followup with PCP      Fax labs to PCP      Tereso Newcomer, PA-C  1:54 PM 03/12/2012

## 2012-03-12 NOTE — Telephone Encounter (Signed)
pt notified about lab results with verbal understanding. Bing Neighbors. PAC decreased pt's synthroid to 100 mcg due to lab results, pt notified to f/u w/PCP

## 2012-03-13 ENCOUNTER — Encounter: Payer: Self-pay | Admitting: Cardiovascular Disease

## 2012-03-17 ENCOUNTER — Ambulatory Visit (INDEPENDENT_AMBULATORY_CARE_PROVIDER_SITE_OTHER): Payer: 59

## 2012-03-17 ENCOUNTER — Ambulatory Visit (HOSPITAL_COMMUNITY): Payer: 59 | Attending: Cardiology | Admitting: Radiology

## 2012-03-17 ENCOUNTER — Encounter (HOSPITAL_COMMUNITY): Payer: 59

## 2012-03-17 VITALS — Ht 66.0 in | Wt 257.0 lb

## 2012-03-17 DIAGNOSIS — R0989 Other specified symptoms and signs involving the circulatory and respiratory systems: Secondary | ICD-10-CM | POA: Insufficient documentation

## 2012-03-17 DIAGNOSIS — R002 Palpitations: Secondary | ICD-10-CM | POA: Insufficient documentation

## 2012-03-17 DIAGNOSIS — E119 Type 2 diabetes mellitus without complications: Secondary | ICD-10-CM | POA: Insufficient documentation

## 2012-03-17 DIAGNOSIS — I10 Essential (primary) hypertension: Secondary | ICD-10-CM | POA: Insufficient documentation

## 2012-03-17 DIAGNOSIS — R0609 Other forms of dyspnea: Secondary | ICD-10-CM | POA: Insufficient documentation

## 2012-03-17 DIAGNOSIS — J45909 Unspecified asthma, uncomplicated: Secondary | ICD-10-CM | POA: Insufficient documentation

## 2012-03-17 DIAGNOSIS — I2510 Atherosclerotic heart disease of native coronary artery without angina pectoris: Secondary | ICD-10-CM

## 2012-03-17 DIAGNOSIS — I4891 Unspecified atrial fibrillation: Secondary | ICD-10-CM

## 2012-03-17 DIAGNOSIS — R079 Chest pain, unspecified: Secondary | ICD-10-CM | POA: Insufficient documentation

## 2012-03-17 MED ORDER — TECHNETIUM TC 99M SESTAMIBI GENERIC - CARDIOLITE
30.0000 | Freq: Once | INTRAVENOUS | Status: AC | PRN
  Administered 2012-03-17: 30 via INTRAVENOUS

## 2012-03-17 MED ORDER — REGADENOSON 0.4 MG/5ML IV SOLN
0.4000 mg | Freq: Once | INTRAVENOUS | Status: AC
  Administered 2012-03-17: 0.4 mg via INTRAVENOUS

## 2012-03-17 NOTE — Progress Notes (Signed)
Placed the patient on 30 days event monitor and went over instructions on how to use it and when to return it.

## 2012-03-17 NOTE — Progress Notes (Signed)
[facility] SITE 3 NUCLEAR MED [address] [place], [place] [address] [phone]    Cardiology Nuclear Med Study  Courtney Pena is a 72 y.o. female     MRN : [id number]     DOB: March 24, 1940  Procedure Date: 03/17/2012  Nuclear Med Background Indication for Stress Test:  Evaluation for Ischemia History:  Asthma and AFIB/AFLUTTER, 06/2002 Heart Cath mod N/O Dz EF: 55%, 06/2007 ECHO, EF: 60%, 03/2010 MPS: EF: 62% Cardiac Risk Factors: History of Smoking, Hypertension, Lipids and NIDDM  Symptoms:  Chest Pain, DOE and Palpitations   Nuclear Pre-Procedure Caffeine/Decaff Intake:  None NPO After: 7:00pm   Lungs:  clear O2 Sat: 94% on room air}. IV 0.9% NS with Angio Cath:  20g  IV Site: R Antecubital  IV Started by:  Milana Na, EMT-P  Chest Size (in):  40 Cup Size: D  Height: 5\' 6"  (1.676 m)  Weight:  257 lb (116.574 kg)  BMI:  Body mass index is 41.48 kg/(m^2). Tech Comments:  No Rx this am    Nuclear Med Study 1 or 2 day study: 2 day  Stress Test Type:  Eugenie Birks  Reading MD: Lars Masson. Order Authorizing Provider:  P.Nishan MD  Resting Radionuclide: Technetium 72m Sestamibi  Resting Radionuclide Dose: 33.0 mCi  On       03-18-12  Stress Radionuclide:  Technetium 43m Sestamibi  Stress Radionuclide Dose: 33.0 mCi   On         03-17-12          Stress Protocol Rest HR: 71 Stress HR: 94  Rest BP: 125/78 Stress BP: 144/68  Exercise Time (min): n/a METS: n/a   Predicted Max HR: 149 bpm % Max HR: 63.09 bpm Rate Pressure Product: 29528    Dose of Adenosine (mg):  n/a Dose of Lexiscan: 0.4 mg  Dose of Atropine (mg): n/a Dose of Dobutamine: n/a mcg/kg/min (at max HR)  Stress Test Technologist: Milana Na, EMT-P  Nuclear Technologist:  Domenic Polite, CNMT     Rest Procedure:  Myocardial perfusion imaging was performed at rest 45 minutes following the intravenous administration of Technetium 28m Sestamibi. Rest ECG: NSR, anterior  MI  Stress Procedure:  The patient received IV Lexiscan 0.4 mg over 15-seconds. This patient had sob, abdominal cramps, throat tightness, jaw aching, nausea, occ pvcs, rare pac, and indigestion with Lexiscan. This patient received Aminophylline 75 mg IV for symptoms. All symptoms were resolved. Technetium 23m Sestamibi injected at 30-seconds.  Quantitative spect images were obtained after a 45 minute delay. Stress ECG: No significant ST segment change suggestive of ischemia.  QPS Raw Data Images:  There is interference from nuclear activity from structures below the diaphragm. This does not affect the ability to read the study. Stress Images:  Normal homogeneous uptake in all areas of the myocardium. Rest Images:  Normal homogeneous uptake in all areas of the myocardium. Subtraction (SDS):  No evidence of ischemia. Transient Ischemic Dilatation (Normal <1.22):  0.91 Lung/Heart Ratio (Normal <0.45):  0.33  Quantitative Gated Spect Images QGS EDV:  102 ml QGS ESV:  35 ml  Impression Exercise Capacity:  Lexiscan with no exercise. BP Response:  Normal blood pressure response. Clinical Symptoms:  There is dyspnea. ECG Impression:  No significant ST segment change suggestive of ischemia. Comparison with Prior Nuclear Study: No images to compare  Overall Impression:  Normal stress nuclear study.  LV Ejection Fraction: 66%.  LV Wall Motion:  NL LV Function; NL Wall Motion  [name]

## 2012-03-18 ENCOUNTER — Ambulatory Visit (HOSPITAL_COMMUNITY): Payer: 59 | Attending: Cardiology | Admitting: Radiology

## 2012-03-18 DIAGNOSIS — R0989 Other specified symptoms and signs involving the circulatory and respiratory systems: Secondary | ICD-10-CM

## 2012-03-18 MED ORDER — TECHNETIUM TC 99M SESTAMIBI GENERIC - CARDIOLITE
30.0000 | Freq: Once | INTRAVENOUS | Status: AC | PRN
  Administered 2012-03-18: 30 via INTRAVENOUS

## 2012-03-20 ENCOUNTER — Encounter: Payer: Self-pay | Admitting: Physician Assistant

## 2012-03-20 ENCOUNTER — Telehealth: Payer: Self-pay | Admitting: *Deleted

## 2012-03-20 NOTE — Telephone Encounter (Signed)
Message copied by Tarri Fuller on Thu Mar 20, 2012 11:12 AM ------      Message from: [place], [place] T      Created: Thu Mar 20, 2012  8:27 AM       Please inform patient stress test normal.      Tereso Newcomer, PA-C  8:27 AM 03/20/2012

## 2012-03-20 NOTE — Telephone Encounter (Signed)
lmom normal myoview 

## 2012-03-24 ENCOUNTER — Ambulatory Visit: Payer: 59 | Admitting: Cardiovascular Disease

## 2012-04-22 ENCOUNTER — Ambulatory Visit (INDEPENDENT_AMBULATORY_CARE_PROVIDER_SITE_OTHER): Payer: 59 | Admitting: Cardiovascular Disease

## 2012-04-22 ENCOUNTER — Encounter: Payer: Self-pay | Admitting: Cardiovascular Disease

## 2012-04-22 VITALS — BP 142/84 | HR 74 | Ht 66.0 in | Wt 259.8 lb

## 2012-04-22 DIAGNOSIS — I4891 Unspecified atrial fibrillation: Secondary | ICD-10-CM

## 2012-04-22 DIAGNOSIS — I2510 Atherosclerotic heart disease of native coronary artery without angina pectoris: Secondary | ICD-10-CM

## 2012-04-22 DIAGNOSIS — E118 Type 2 diabetes mellitus with unspecified complications: Secondary | ICD-10-CM

## 2012-04-22 DIAGNOSIS — I480 Paroxysmal atrial fibrillation: Secondary | ICD-10-CM

## 2012-04-22 DIAGNOSIS — D500 Iron deficiency anemia secondary to blood loss (chronic): Secondary | ICD-10-CM

## 2012-04-22 DIAGNOSIS — E7800 Pure hypercholesterolemia, unspecified: Secondary | ICD-10-CM

## 2012-04-22 DIAGNOSIS — E1165 Type 2 diabetes mellitus with hyperglycemia: Secondary | ICD-10-CM

## 2012-04-22 DIAGNOSIS — I10 Essential (primary) hypertension: Secondary | ICD-10-CM

## 2012-04-22 NOTE — Assessment & Plan Note (Signed)
Well controlled.  Continue current medications and low sodium Dash type diet.    

## 2012-04-22 NOTE — Assessment & Plan Note (Signed)
Discussed need for better low carb diet and weight loss.  Knowing Courtney Pena she will continue to have poor health habits and be sedentary.  Tramadol for arthritis

## 2012-04-22 NOTE — Assessment & Plan Note (Signed)
Cholesterol is at goal.  Continue current dose of statin and diet Rx.  No myalgias or side effects.  F/U  LFT's in 6 months. Lab Results  Component Value Date   LDLCALC 111* 12/08/2010

## 2012-04-22 NOTE — Assessment & Plan Note (Signed)
Continue cardizem and ASA  No PAF on event monitor

## 2012-04-22 NOTE — Progress Notes (Signed)
Patient ID: Courtney Pena, female   DOB: 12-04-1940, 72 y.o.   MRN: [id number] Courtney Pena is seen today for f/.u of PAF She is a very pleasant 72 year old black female with multiple medical issues as outlined below. She has had PAF back in October 2013 . CHADS is 2 and CHADSvasc is 5. Does not appear that she has chronic atrial fib to me. Anticoagulation was warranted and she was placed on Xarelto back last year. Last seen here in February and was doing ok. By May/June she started feeling weak and tired. Was lightheaded. No syncope. Was hospitalized with a GI bleed. Xarelto was stopped. 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 She was told that there was possibility of future bleeding. She was transfused.  No history of CAD  Myovue done 03/17/12 reviewed and normal  Saw :PA and complained of chest pain and palpitations.  Event monitor tod date reviewed and only PVC;s no PAF  ROS: Denies fever, malais, weight loss, blurry vision, decreased visual acuity, cough, sputum, SOB, hemoptysis, pleuritic pain, palpitaitons, heartburn, abdominal pain, melena, lower extremity edema, claudication, or rash.  All other systems reviewed and negative  General: Affect appropriate Healthy:  appears stated age HEENT: normal Neck supple with no adenopathy JVP normal no bruits no thyromegaly Lungs clear with no wheezing and good diaphragmatic motion Heart:  S1/S2 no murmur, no rub, gallop or click PMI normal Abdomen: benighn, BS positve, no tenderness, no AAA no bruit.  No HSM or HJR Distal pulses intact with no bruits No edema Neuro non-focal Skin warm and dry No muscular weakness   Current Outpatient Prescriptions  Medication Sig Dispense Refill  . acetaminophen (TYLENOL) 500 MG tablet Take 500-1,000 mg by mouth as needed. As needed for headaches or arthritis      . aspirin 81 MG tablet Take 1 tablet (81 mg total) by mouth daily. (at bedtime) Do not take  until 08/19/11 then take daily unless instructed otherwise by Dr. Leveda Anna.  30 tablet    . Cholecalciferol (VITAMIN D3) 1000 UNITS CAPS Take 2 capsules by mouth daily.       Marland Kitchen diltiazem (CARDIZEM CD) 180 MG 24 hr capsule Take 1 capsule (180 mg total) by mouth daily.  90 capsule  3  . glipiZIDE (GLUCOTROL XL) 10 MG 24 hr tablet Take 1 tablet (10 mg total) by mouth daily.  90 tablet  3  . hydrochlorothiazide (HYDRODIURIL) 25 MG tablet Take 1 tablet (25 mg total) by mouth daily.  90 tablet  3  . iron polysaccharides (FERREX 150) 150 MG capsule       . levothyroxine (SYNTHROID, LEVOTHROID) 100 MCG tablet Take 1 tablet (100 mcg total) by mouth daily.  30 tablet  2  . Liraglutide 18 MG/3ML SOLN Inject 0.2 mLs (1.2 mg total) into the skin daily before lunch. After a snack  18 mL  3  . metFORMIN (GLUCOPHAGE) 1000 MG tablet TAKE ONE TABLET BY MOUTH TWICE DAILY WITH MEALS  180 tablet  2  . metoprolol succinate (TOPROL-XL) 100 MG 24 hr tablet Take 1 tablet (100 mg total) by mouth daily. Take with or immediately following a meal.  90 tablet  2  . nitroGLYCERIN (NITROSTAT) 0.4 MG SL tablet Place 1 tablet (0.4 mg total) under the tongue every 5 (five) minutes as needed for chest pain.  25 tablet  11  . omeprazole (PRILOSEC) 40 MG capsule TAKE 1 CAPSULE (40 MG TOTAL) BY MOUTH  DAILY. FOR STOMACH  90 capsule  2  . propranolol (INDERAL) 10 MG tablet TAKE 1 TABLET BY MOUTH 4 TIMES A DAY AS NEEDED  40 tablet  2  . spironolactone (ALDACTONE) 25 MG tablet Take 1 tablet (25 mg total) by mouth daily.  90 tablet  3  . triamcinolone ointment (KENALOG) 0.1 % Apply topically 2 (two) times daily as needed.      . pravastatin (PRAVACHOL) 40 MG tablet Take 1 tablet (40 mg total) by mouth every evening.  30 tablet  11   No current facility-administered medications for this visit.    Allergies  Lisinopril; Losartan potassium; Penicillins; Sulfamethoxazole; Pioglitazone; and Nsaids  Electrocardiogram:  1/14/  SR rate 83  Poor R  wave progression PVC  Assessment and Plan

## 2012-04-22 NOTE — Assessment & Plan Note (Signed)
No recurrent chest pain.  Myovue 1/13 normal.  Continue current medical Rx

## 2012-04-22 NOTE — Patient Instructions (Signed)
Your physician wants you to follow-up in:  6 MONTHS WITH DR NISHAN  You will receive a reminder letter in the mail two months in advance. If you don't receive a letter, please call our office to schedule the follow-up appointment. Your physician recommends that you continue on your current medications as directed. Please refer to the Current Medication list given to you today. 

## 2012-04-22 NOTE — Assessment & Plan Note (Signed)
No anticoagulation for PAF due to significant GI bleed requiring transfusion

## 2012-05-05 ENCOUNTER — Encounter: Payer: Self-pay | Admitting: Home Health Services

## 2012-05-14 ENCOUNTER — Encounter: Payer: Self-pay | Admitting: Family Medicine

## 2012-05-14 ENCOUNTER — Ambulatory Visit (INDEPENDENT_AMBULATORY_CARE_PROVIDER_SITE_OTHER): Payer: 59 | Admitting: Family Medicine

## 2012-05-14 VITALS — BP 135/71 | HR 96 | Temp 98.9°F | Ht 66.0 in | Wt 258.0 lb

## 2012-05-14 DIAGNOSIS — E7800 Pure hypercholesterolemia, unspecified: Secondary | ICD-10-CM

## 2012-05-14 DIAGNOSIS — E119 Type 2 diabetes mellitus without complications: Secondary | ICD-10-CM

## 2012-05-14 DIAGNOSIS — E118 Type 2 diabetes mellitus with unspecified complications: Secondary | ICD-10-CM

## 2012-05-14 DIAGNOSIS — E1165 Type 2 diabetes mellitus with hyperglycemia: Secondary | ICD-10-CM

## 2012-05-14 DIAGNOSIS — E039 Hypothyroidism, unspecified: Secondary | ICD-10-CM

## 2012-05-14 DIAGNOSIS — D500 Iron deficiency anemia secondary to blood loss (chronic): Secondary | ICD-10-CM

## 2012-05-14 LAB — POCT GLYCOSYLATED HEMOGLOBIN (HGB A1C): Hemoglobin A1C: 9.9

## 2012-05-14 LAB — CBC
HCT: 35.9 % — ABNORMAL LOW (ref 36.0–46.0)
Hemoglobin: 11.7 g/dL — ABNORMAL LOW (ref 12.0–15.0)
MCH: 27.3 pg (ref 26.0–34.0)
MCHC: 32.6 g/dL (ref 30.0–36.0)
MCV: 83.7 fL (ref 78.0–100.0)
Platelets: 285 10*3/uL (ref 150–400)
RBC: 4.29 MIL/uL (ref 3.87–5.11)
RDW: 15.9 % — ABNORMAL HIGH (ref 11.5–15.5)
WBC: 8.6 10*3/uL (ref 4.0–10.5)

## 2012-05-14 LAB — TSH: TSH: 2.961 u[IU]/mL (ref 0.350–4.500)

## 2012-05-14 LAB — FERRITIN: Ferritin: 20 ng/mL (ref 10–291)

## 2012-05-14 NOTE — Patient Instructions (Addendum)
You should have a CT scan of your chest Sept 2014 - 15 months.    You do not need to worry about the spot on your kidney. Please double check you medicine list. I will call with blood work results

## 2012-05-15 LAB — COMPLETE METABOLIC PANEL WITH GFR
ALT: 17 U/L (ref 0–35)
AST: 13 U/L (ref 0–37)
Albumin: 4.4 g/dL (ref 3.5–5.2)
Alkaline Phosphatase: 87 U/L (ref 39–117)
BUN: 26 mg/dL — ABNORMAL HIGH (ref 6–23)
CO2: 25 mEq/L (ref 19–32)
Calcium: 10.5 mg/dL (ref 8.4–10.5)
Chloride: 98 mEq/L (ref 96–112)
Creat: 1.17 mg/dL — ABNORMAL HIGH (ref 0.50–1.10)
GFR, Est African American: 54 mL/min — ABNORMAL LOW
GFR, Est Non African American: 47 mL/min — ABNORMAL LOW
Glucose, Bld: 269 mg/dL — ABNORMAL HIGH (ref 70–99)
Potassium: 4.7 mEq/L (ref 3.5–5.3)
Sodium: 134 mEq/L — ABNORMAL LOW (ref 135–145)
Total Bilirubin: 0.4 mg/dL (ref 0.3–1.2)
Total Protein: 7.6 g/dL (ref 6.0–8.3)

## 2012-05-15 LAB — LIPID PANEL
Cholesterol: 250 mg/dL — ABNORMAL HIGH (ref 0–200)
HDL: 39 mg/dL — ABNORMAL LOW (ref >39)
Total CHOL/HDL Ratio: 6.4 Ratio
Triglycerides: 476 mg/dL — ABNORMAL HIGH (ref <150)

## 2012-05-16 ENCOUNTER — Encounter: Payer: Self-pay | Admitting: Family Medicine

## 2012-05-16 NOTE — Progress Notes (Signed)
  Subjective:    Patient ID: Courtney Pena, female    DOB: 09-06-1940, 72 y.o.   MRN: [id number]  HPI  Recheck multiple problems.   Needs lipids checked.   Wt loss has plateaured.   DM control is poor and still reluctant to go on insulin.   Needs recheck of anemia She is still working.  Husband with early dementia now doing well on meds.      Review of Systems     Objective:   Physical Exam Lungs clear Cardiac RRR with m or g Abd benign. No edema       Assessment & Plan:

## 2012-05-16 NOTE — Assessment & Plan Note (Signed)
Recent high TSH at cards, recheck on lower dose.

## 2012-05-16 NOTE — Assessment & Plan Note (Signed)
Poor control.  Need more wt loss, insulin or both.

## 2012-05-16 NOTE — Assessment & Plan Note (Signed)
Check labs 

## 2012-05-27 ENCOUNTER — Telehealth: Payer: Self-pay | Admitting: Cardiovascular Disease

## 2012-05-27 NOTE — Telephone Encounter (Signed)
New Problem:    Patient called in needing a letter of medical necessity and a diagnosis sent in to her insurance company for her holtor monitor.  Please call back.

## 2012-05-27 NOTE — Telephone Encounter (Signed)
PT  CALLED RE MONITOR THAT WAS  DONE IN January  NEEDS LETTER OF NECESSITY PT AWARE WILL  ADDRESS  ON Friday ./CY

## 2012-05-30 ENCOUNTER — Encounter: Payer: Self-pay | Admitting: *Deleted

## 2012-05-30 NOTE — Telephone Encounter (Signed)
SEE LETTERS. PT AWARE LETTER LEFT AT FRONT DESK FOR PICK UP .Courtney Pena

## 2012-06-18 ENCOUNTER — Other Ambulatory Visit: Payer: Self-pay | Admitting: Physician Assistant

## 2012-06-23 ENCOUNTER — Other Ambulatory Visit: Payer: Self-pay

## 2012-06-23 DIAGNOSIS — Z1231 Encounter for screening mammogram for malignant neoplasm of breast: Secondary | ICD-10-CM

## 2012-07-15 ENCOUNTER — Ambulatory Visit
Admission: RE | Admit: 2012-07-15 | Discharge: 2012-07-15 | Disposition: A | Discharge status: Home / Self Care | Payer: 59 | Source: Ambulatory Visit

## 2012-07-15 ENCOUNTER — Ambulatory Visit: Payer: 59

## 2012-07-15 DIAGNOSIS — Z1231 Encounter for screening mammogram for malignant neoplasm of breast: Secondary | ICD-10-CM

## 2012-08-01 ENCOUNTER — Ambulatory Visit: Payer: 59 | Admitting: Family Medicine

## 2012-08-06 ENCOUNTER — Ambulatory Visit: Payer: 59 | Admitting: Family Medicine

## 2012-08-08 ENCOUNTER — Telehealth: Payer: Self-pay | Admitting: Cardiovascular Disease

## 2012-08-08 NOTE — Telephone Encounter (Signed)
SPOKE WITH PT EARLIER WILL DROP OFF INFO   ON Monday RE GETTING  INS TO COVER MONITOR THAT WAS DONE IN  January ./CY

## 2012-08-08 NOTE — Telephone Encounter (Signed)
New Prob     Pt states she was prescribed a 30 day monitor, however, insurance will not cover it. She is needing some more documentation from Dr. Eden Emms to provide for her insurance company. Please call,.

## 2012-08-19 NOTE — Telephone Encounter (Signed)
PER PT   HAS NOT DROPPED OFF  INFO WILL DO TODAY OR TOM./CY

## 2012-08-22 ENCOUNTER — Ambulatory Visit (INDEPENDENT_AMBULATORY_CARE_PROVIDER_SITE_OTHER): Payer: 59 | Admitting: Family Medicine

## 2012-08-22 ENCOUNTER — Encounter: Payer: Self-pay | Admitting: Family Medicine

## 2012-08-22 VITALS — BP 149/78 | HR 80 | Temp 97.8°F | Ht 66.0 in | Wt 258.8 lb

## 2012-08-22 DIAGNOSIS — E1165 Type 2 diabetes mellitus with hyperglycemia: Secondary | ICD-10-CM

## 2012-08-22 DIAGNOSIS — K2100 Gastro-esophageal reflux disease with esophagitis, without bleeding: Secondary | ICD-10-CM

## 2012-08-22 DIAGNOSIS — L259 Unspecified contact dermatitis, unspecified cause: Secondary | ICD-10-CM

## 2012-08-22 DIAGNOSIS — L300 Nummular dermatitis: Secondary | ICD-10-CM

## 2012-08-22 DIAGNOSIS — I10 Essential (primary) hypertension: Secondary | ICD-10-CM

## 2012-08-22 DIAGNOSIS — E118 Type 2 diabetes mellitus with unspecified complications: Secondary | ICD-10-CM

## 2012-08-22 DIAGNOSIS — E039 Hypothyroidism, unspecified: Secondary | ICD-10-CM

## 2012-08-22 DIAGNOSIS — E781 Pure hyperglyceridemia: Secondary | ICD-10-CM

## 2012-08-22 DIAGNOSIS — R1012 Left upper quadrant pain: Secondary | ICD-10-CM

## 2012-08-22 DIAGNOSIS — E1169 Type 2 diabetes mellitus with other specified complication: Secondary | ICD-10-CM | POA: Insufficient documentation

## 2012-08-22 DIAGNOSIS — R911 Solitary pulmonary nodule: Secondary | ICD-10-CM

## 2012-08-22 DIAGNOSIS — E7800 Pure hypercholesterolemia, unspecified: Secondary | ICD-10-CM

## 2012-08-22 LAB — POCT GLYCOSYLATED HEMOGLOBIN (HGB A1C): Hemoglobin A1C: 9.4

## 2012-08-22 LAB — TRIGLYCERIDES: Triglycerides: 188 mg/dL — ABNORMAL HIGH (ref <150)

## 2012-08-22 LAB — LDL CHOLESTEROL, DIRECT: Direct LDL: 150 mg/dL — ABNORMAL HIGH

## 2012-08-22 MED ORDER — LEVOTHYROXINE SODIUM 100 MCG PO TABS: ORAL_TABLET | ORAL | Status: DC

## 2012-08-22 MED ORDER — METFORMIN HCL 1000 MG PO TABS: ORAL_TABLET | ORAL | Status: DC

## 2012-08-22 MED ORDER — OMEPRAZOLE 40 MG PO CPDR: DELAYED_RELEASE_CAPSULE | ORAL | Status: DC

## 2012-08-22 MED ORDER — BETAMETHASONE DIPROPIONATE 0.05 % EX CREA: TOPICAL_CREAM | Freq: Two times a day (BID) | CUTANEOUS | Status: DC

## 2012-08-22 NOTE — Assessment & Plan Note (Signed)
Fasting this morning.  Will check.

## 2012-08-22 NOTE — Assessment & Plan Note (Signed)
Poor control.  3 more months of lifestyle changes.  If A1C still greater than 9, she knows I will start insulin.

## 2012-08-22 NOTE — Patient Instructions (Addendum)
If your GI doctor wants to do a CT of abd, ask them to order at the same time a non contrast CT of chest to FU pulmonary nodule. (Rt Upper lobe and not related to pain.) Your blood pressure is borderline high.  Keep an eye on it and see me in two months (sooner if home BPs high.) Don't order any more cardiazem until we are sure you're going to stay on that dose.   Touch base with me after the GI doc visit.  Depending on what he says, I may want to do the CT of chest earlier than Sept.

## 2012-08-22 NOTE — Assessment & Plan Note (Signed)
Needs dLDL since could not be calculated on lipid panel

## 2012-08-22 NOTE — Assessment & Plan Note (Signed)
Refill omeprazole

## 2012-08-22 NOTE — Assessment & Plan Note (Signed)
Refill meds

## 2012-08-22 NOTE — Assessment & Plan Note (Signed)
Borderline control.  If still elevated, will increase cardiazem

## 2012-08-22 NOTE — Assessment & Plan Note (Signed)
Due for CT of chest in Sept 2014 for Rt upper lobe nodule - unlikely connected to her Left lower chest/upper abd pain.  If CT of abd is warranted, will get CT of chest at the same time.

## 2012-08-22 NOTE — Progress Notes (Signed)
  Subjective:    Patient ID: Courtney Pena, female    DOB: 13-May-1940, 72 y.o.   MRN: [id number]  HPI BP good on home readings. Lt U abd/lower chest pain for months. No pattern noticed.  Specifically, not with eating or BM.  No SOB.  Maybe worsened by certain movement. Pain is clearly not in the breast per patient. Skin eczema flairing, needs refill. DM, A1C drawn at conclusion of visit.  Called patient afterward 9.4 is slight improvement, but still poor control.     Review of Systems     Objective:   Physical ExamLungs clear No chest wall masses or tenderness.  Cardiac RRR without m or g Abd benign.         Assessment & Plan:

## 2012-08-22 NOTE — Assessment & Plan Note (Signed)
I really suspect musculoskeletal.  I defered to GI about GI work up.  See pulm nodule - I need to do chest CT soon.

## 2012-08-25 ENCOUNTER — Encounter: Payer: Self-pay | Admitting: Family Medicine

## 2012-08-25 NOTE — Progress Notes (Signed)
Patient ID: Courtney Pena, female   DOB: 01/29/1941, 72 y.o.   MRN: [id number] Gave lab results.  She has been intollerant of multiple statins in the past.  Explained that her 10 year MI risk was 37%.  The last statin (she has at home and I called her at work) did OK for a while until she started getting leg cramps one month later.  She will try this statin again.  Asked to call my office with the name of statin so I could add to list.

## 2012-08-27 ENCOUNTER — Telehealth: Payer: Self-pay | Admitting: Cardiovascular Disease

## 2012-08-27 NOTE — Telephone Encounter (Signed)
Pt Dropped off Envelope For Courtney Pena is Written on " Confidential"  Wynona Canes does Not come back in office Until Next Week Will place in her [facility] box  08/27/12/KM

## 2012-08-28 ENCOUNTER — Ambulatory Visit: Payer: 59 | Admitting: Gastroenterology

## 2012-08-28 ENCOUNTER — Telehealth: Payer: Self-pay | Admitting: Gastroenterology

## 2012-08-28 NOTE — Telephone Encounter (Signed)
Issues to be charging she also did not show for visit on 07/31/2012.  Please call her,and if she misses another appointment, she will be discharged from GI.

## 2012-09-05 NOTE — Telephone Encounter (Signed)
PT AWARE   WILL REVIEW   INFO  NEXT WEEK./CY

## 2012-09-05 NOTE — Telephone Encounter (Signed)
Follow Up ° ° ° ° °Pt calling returning call from earlier. Please call back. °

## 2012-09-15 ENCOUNTER — Telehealth: Payer: Self-pay | Admitting: Cardiovascular Disease

## 2012-09-15 ENCOUNTER — Encounter: Payer: Self-pay | Admitting: *Deleted

## 2012-09-15 NOTE — Telephone Encounter (Signed)
SEE OTHER PHONE NOTE./CY 

## 2012-09-15 NOTE — Telephone Encounter (Signed)
PT  AWARE  LETTER PREPARED  FOR INS  CO  TO  REVIEW  PAYMENT FOR MONITOR THAT WAS ORDERED  BACK IN  JAN 2014 ./CY

## 2012-09-15 NOTE — Telephone Encounter (Signed)
New Prob ° ° ° °Pt wanting to speak to nurse. Did not leave details about call. °

## 2012-10-29 ENCOUNTER — Other Ambulatory Visit: Payer: Self-pay | Admitting: Family Medicine

## 2012-10-29 NOTE — Telephone Encounter (Signed)
Patient is calling because [facility] has sent a refill request for her Metoprolol but has not heard back and she has only 2 left and needs a refill very soon.

## 2012-10-30 ENCOUNTER — Other Ambulatory Visit: Payer: Self-pay | Admitting: Family Medicine

## 2012-10-30 MED ORDER — METOPROLOL SUCCINATE ER 100 MG PO TB24: 100.0000 mg | ORAL_TABLET | Freq: Every day | ORAL | Status: DC

## 2012-10-30 NOTE — Telephone Encounter (Signed)
Left message on voicemail that rx was called in. Sharee Sturdy, Virgel Bouquet

## 2012-11-10 ENCOUNTER — Ambulatory Visit: Payer: 59 | Admitting: Cardiovascular Disease

## 2012-11-12 ENCOUNTER — Ambulatory Visit (INDEPENDENT_AMBULATORY_CARE_PROVIDER_SITE_OTHER): Payer: 59 | Admitting: *Deleted

## 2012-11-12 DIAGNOSIS — Z23 Encounter for immunization: Secondary | ICD-10-CM

## 2012-12-08 ENCOUNTER — Ambulatory Visit: Payer: 59 | Admitting: Cardiovascular Disease

## 2012-12-10 ENCOUNTER — Ambulatory Visit: Payer: 59 | Admitting: Family Medicine

## 2012-12-26 ENCOUNTER — Ambulatory Visit (INDEPENDENT_AMBULATORY_CARE_PROVIDER_SITE_OTHER): Payer: 59 | Admitting: Family Medicine

## 2012-12-26 ENCOUNTER — Encounter: Payer: Self-pay | Admitting: Family Medicine

## 2012-12-26 VITALS — BP 146/73 | HR 81 | Temp 98.2°F | Ht 66.0 in | Wt 252.0 lb

## 2012-12-26 DIAGNOSIS — M1990 Unspecified osteoarthritis, unspecified site: Secondary | ICD-10-CM

## 2012-12-26 DIAGNOSIS — I10 Essential (primary) hypertension: Secondary | ICD-10-CM

## 2012-12-26 DIAGNOSIS — D500 Iron deficiency anemia secondary to blood loss (chronic): Secondary | ICD-10-CM

## 2012-12-26 DIAGNOSIS — E039 Hypothyroidism, unspecified: Secondary | ICD-10-CM

## 2012-12-26 DIAGNOSIS — R911 Solitary pulmonary nodule: Secondary | ICD-10-CM

## 2012-12-26 DIAGNOSIS — E1165 Type 2 diabetes mellitus with hyperglycemia: Secondary | ICD-10-CM

## 2012-12-26 LAB — COMPLETE METABOLIC PANEL WITH GFR
ALT: 13 U/L (ref 0–35)
AST: 13 U/L (ref 0–37)
Albumin: 4.3 g/dL (ref 3.5–5.2)
Alkaline Phosphatase: 81 U/L (ref 39–117)
BUN: 27 mg/dL — ABNORMAL HIGH (ref 6–23)
CO2: 24 mEq/L (ref 19–32)
Calcium: 10.2 mg/dL (ref 8.4–10.5)
Chloride: 100 mEq/L (ref 96–112)
Creat: 1.09 mg/dL (ref 0.50–1.10)
GFR, Est African American: 59 mL/min — ABNORMAL LOW
GFR, Est Non African American: 51 mL/min — ABNORMAL LOW
Glucose, Bld: 182 mg/dL — ABNORMAL HIGH (ref 70–99)
Potassium: 4.4 mEq/L (ref 3.5–5.3)
Sodium: 135 mEq/L (ref 135–145)
Total Bilirubin: 0.3 mg/dL (ref 0.3–1.2)
Total Protein: 7.7 g/dL (ref 6.0–8.3)

## 2012-12-26 LAB — CBC
HCT: 35 % — ABNORMAL LOW (ref 36.0–46.0)
Hemoglobin: 11.5 g/dL — ABNORMAL LOW (ref 12.0–15.0)
MCH: 28.5 pg (ref 26.0–34.0)
MCHC: 32.9 g/dL (ref 30.0–36.0)
MCV: 86.6 fL (ref 78.0–100.0)
Platelets: 293 10*3/uL (ref 150–400)
RBC: 4.04 MIL/uL (ref 3.87–5.11)
RDW: 15.7 % — ABNORMAL HIGH (ref 11.5–15.5)
WBC: 8.6 10*3/uL (ref 4.0–10.5)

## 2012-12-26 LAB — POCT GLYCOSYLATED HEMOGLOBIN (HGB A1C): Hemoglobin A1C: 8.4

## 2012-12-26 MED ORDER — LEVOTHYROXINE SODIUM 100 MCG PO TABS: ORAL_TABLET | ORAL | Status: DC

## 2012-12-26 MED ORDER — PROPRANOLOL HCL 10 MG PO TABS: ORAL_TABLET | ORAL | Status: DC

## 2012-12-26 MED ORDER — GLUCOSE BLOOD VI STRP: ORAL_STRIP | Status: DC

## 2012-12-26 MED ORDER — TRAMADOL HCL 50 MG PO TABS: 50.0000 mg | ORAL_TABLET | Freq: Three times a day (TID) | ORAL | Status: DC | PRN

## 2012-12-26 NOTE — Patient Instructions (Signed)
Good work with diet.  Your A1C fell a full point.  No more diclofenac I will call with blood work results and CT.    Keep working on the weight loss See me in three.

## 2012-12-26 NOTE — Assessment & Plan Note (Signed)
Recheck CBC. 

## 2012-12-26 NOTE — Progress Notes (Signed)
  Subjective:    Patient ID: Courtney Pena, female    DOB: 1941-02-09, 72 y.o.   MRN: [id number]  HPI  FU chronic problems.  Feels good.  Still working.  Working very hard on diet and has lost several pounds.  Taking meds without difficulty.   1. BP OK 2. DM fair and improving control 3. Needs CT of chest to follow up pulm nodule. 4. Wants refill on diclofenac - declined and explained issue of NSAIDs and CKD.    Review of Systems     Objective:   Physical ExamLungs clear Cardiac RRR without m or g Ext. Normal pulses and sensation on diabetic foot exam.        Assessment & Plan:

## 2012-12-26 NOTE — Assessment & Plan Note (Signed)
Continue diet and exercise.  Nice A1C drop since last visit.

## 2012-12-26 NOTE — Assessment & Plan Note (Signed)
At goal.  Continue meds and lifestyle work.

## 2012-12-29 ENCOUNTER — Encounter: Payer: Self-pay | Admitting: Family Medicine

## 2012-12-30 ENCOUNTER — Encounter: Payer: Self-pay | Admitting: Family Medicine

## 2012-12-30 ENCOUNTER — Ambulatory Visit
Admission: RE | Admit: 2012-12-30 | Discharge: 2012-12-30 | Disposition: A | Discharge status: Home / Self Care | Payer: 59 | Source: Ambulatory Visit | Attending: Family Medicine | Admitting: Family Medicine

## 2012-12-30 DIAGNOSIS — R911 Solitary pulmonary nodule: Secondary | ICD-10-CM

## 2012-12-31 ENCOUNTER — Ambulatory Visit (INDEPENDENT_AMBULATORY_CARE_PROVIDER_SITE_OTHER): Payer: 59 | Admitting: Cardiovascular Disease

## 2012-12-31 ENCOUNTER — Encounter: Payer: Self-pay | Admitting: Cardiovascular Disease

## 2012-12-31 VITALS — BP 138/82 | HR 80 | Ht 66.0 in | Wt 253.0 lb

## 2012-12-31 DIAGNOSIS — I10 Essential (primary) hypertension: Secondary | ICD-10-CM

## 2012-12-31 DIAGNOSIS — K31819 Angiodysplasia of stomach and duodenum without bleeding: Secondary | ICD-10-CM

## 2012-12-31 DIAGNOSIS — E7800 Pure hypercholesterolemia, unspecified: Secondary | ICD-10-CM

## 2012-12-31 DIAGNOSIS — I4891 Unspecified atrial fibrillation: Secondary | ICD-10-CM

## 2012-12-31 DIAGNOSIS — I480 Paroxysmal atrial fibrillation: Secondary | ICD-10-CM

## 2012-12-31 DIAGNOSIS — E1165 Type 2 diabetes mellitus with hyperglycemia: Secondary | ICD-10-CM

## 2012-12-31 NOTE — Assessment & Plan Note (Signed)
Discussed low carb diet.  Target hemoglobin A1c is 6.5 or less.  Continue current medications.  

## 2012-12-31 NOTE — Assessment & Plan Note (Signed)
Well controlled.  Continue current medications and low sodium Dash type diet.    

## 2012-12-31 NOTE — Assessment & Plan Note (Signed)
Makes long term anticoagulatoin risky Hct stable no GI symptoms

## 2012-12-31 NOTE — Progress Notes (Signed)
Patient ID: Courtney Pena, female   DOB: April 28, 1940, 72 y.o.   MRN: [id number] Ketsia is seen today for f/.u of PAF She is a very pleasant 72 year old black female with multiple medical issues  She has had PAF back in October 2013 . CHADS is 2 and CHADSvasc is 5. Does not appear that she has chronic atrial fib to me. Anticoagulation was warranted and she was placed on Xarelto back last year. Last seen here in February and was doing ok. By May/June she started feeling weak and tired. Was lightheaded. No syncope. Was hospitalized with a GI bleed. Xarelto was stopped. 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 She was told that there was possibility of future bleeding. She was transfused.  No history of CAD Myovue done 03/17/12 reviewed and normal Saw :PA and complained of chest pain and palpitations. Event monitor tod date reviewed and only PVC;s no PAF  Doing quite well Compliant with meds.  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  Spending lots of time with family and grandchildren' No recent palpitations or GI issues. Hct stable and A1c improving  ROS: Denies fever, malais, weight loss, blurry vision, decreased visual acuity, cough, sputum, SOB, hemoptysis, pleuritic pain, palpitaitons, heartburn, abdominal pain, melena, lower extremity edema, claudication, or rash.  All other systems reviewed and negative  General: Affect appropriate Healthy:  appears stated age HEENT: normal Neck supple with no adenopathy JVP normal no bruits no thyromegaly Lungs clear with no wheezing and good diaphragmatic motion Heart:  S1/S2 no murmur, no rub, gallop or click PMI normal Abdomen: benighn, BS positve, no tenderness, no AAA no bruit.  No HSM or HJR Distal pulses intact with no bruits No edema Neuro non-focal Skin warm and dry No muscular weakness   Current Outpatient Prescriptions  Medication Sig Dispense Refill  .  acetaminophen (TYLENOL) 500 MG tablet Take 500-1,000 mg by mouth as needed. As needed for headaches or arthritis      . aspirin 81 MG tablet Take 1 tablet (81 mg total) by mouth daily. (at bedtime) Do not take until 08/19/11 then take daily unless instructed otherwise by Dr. Leveda Anna.  30 tablet    . augmented betamethasone dipropionate (DIPROLENE-AF) 0.05 % cream       . betamethasone dipropionate (DIPROLENE) 0.05 % cream Apply topically 2 (two) times daily. Do not use on face  45 g  12  . Cholecalciferol (VITAMIN D3) 1000 UNITS CAPS Take 1 capsule by mouth daily.       Marland Kitchen diltiazem (CARDIZEM CD) 180 MG 24 hr capsule Take 1 capsule (180 mg total) by mouth daily.  90 capsule  3  . glipiZIDE (GLUCOTROL XL) 10 MG 24 hr tablet Take 1 tablet (10 mg total) by mouth daily.  90 tablet  3  . glucose blood test strip Test three times daily  300 each  3  . hydrochlorothiazide (HYDRODIURIL) 25 MG tablet Take 1 tablet (25 mg total) by mouth daily.  90 tablet  3  . iron polysaccharides (FERREX 150) 150 MG capsule       . levothyroxine (SYNTHROID, LEVOTHROID) 100 MCG tablet TAKE 1 TABLET (100 MCG TOTAL) BY MOUTH DAILY.  30 tablet  3  . Liraglutide 18 MG/3ML SOLN Inject 0.2 mLs (1.2 mg total) into the skin daily before lunch. After a snack  18 mL  3  . metFORMIN (GLUCOPHAGE) 1000 MG tablet TAKE ONE TABLET BY MOUTH TWICE  DAILY WITH MEALS  180 tablet  3  . metoprolol succinate (TOPROL-XL) 100 MG 24 hr tablet Take 1 tablet by mouth  daily with a meal  90 tablet  3  . nitroGLYCERIN (NITROSTAT) 0.4 MG SL tablet Place 1 tablet (0.4 mg total) under the tongue every 5 (five) minutes as needed for chest pain.  25 tablet  11  . omeprazole (PRILOSEC) 40 MG capsule TAKE 1 CAPSULE (40 MG TOTAL) BY MOUTH DAILY. FOR STOMACH  90 capsule  3  . propranolol (INDERAL) 10 MG tablet TAKE 1 TABLET BY MOUTH 4 TIMES A DAY AS NEEDED  40 tablet  2  . spironolactone (ALDACTONE) 25 MG tablet Take 1 tablet (25 mg total) by mouth daily.  90 tablet   3   No current facility-administered medications for this visit.    Allergies  Lisinopril; Losartan potassium; Penicillins; Sulfamethoxazole; Pioglitazone; and Nsaids  Electrocardiogram: 03/17/12 SR rate 81 ? Old anterior MI PVC   Assessment and Plan

## 2012-12-31 NOTE — Assessment & Plan Note (Signed)
Cholesterol is at goal.  Continue current dose of statin and diet Rx.  No myalgias or side effects.  F/U  LFT's in 6 months. Lab Results  Component Value Date   LDLCALC Comment:   Not calculated due to Triglyceride >400. Suggest ordering Direct LDL (Unit Code: 16109).   Total Cholesterol/HDL Ratio:CHD Risk                        Coronary Heart Disease Risk Table                                        Men       Women          1/2 Average Risk              3.4        3.3              Average Risk              5.0        4.4           2X Average Risk              9.6        7.1           3X Average Risk             23.4       11.0 Use the calculated Patient Ratio above and the CHD Risk table  to determine the patient's CHD Risk. ATP III Classification (LDL):       < 100        mg/dL         Optimal      604 - 129     mg/dL         Near or Above Optimal      130 - 159     mg/dL         Borderline High      160 - 189     mg/dL         High       > 540        mg/dL         Very High   9/81/1914

## 2012-12-31 NOTE — Patient Instructions (Signed)
Your physician wants you to follow-up in:  6 MONTHS WITH DR NISHAN  You will receive a reminder letter in the mail two months in advance. If you don't receive a letter, please call our office to schedule the follow-up appointment. Your physician recommends that you continue on your current medications as directed. Please refer to the Current Medication list given to you today. 

## 2012-12-31 NOTE — Assessment & Plan Note (Signed)
Maint NSR continue asa, beta blocker and cardizem

## 2013-01-06 ENCOUNTER — Other Ambulatory Visit: Payer: Self-pay | Admitting: Family Medicine

## 2013-02-17 ENCOUNTER — Ambulatory Visit: Payer: 59 | Admitting: Family Medicine

## 2013-02-23 ENCOUNTER — Other Ambulatory Visit: Payer: Self-pay | Admitting: Family Medicine

## 2013-02-23 DIAGNOSIS — E1165 Type 2 diabetes mellitus with hyperglycemia: Secondary | ICD-10-CM

## 2013-02-23 MED ORDER — GLIPIZIDE ER 10 MG PO TB24: ORAL_TABLET | ORAL | Status: DC

## 2013-02-23 NOTE — Assessment & Plan Note (Signed)
Refilled by fax from [facility]

## 2013-05-13 ENCOUNTER — Encounter: Payer: 59 | Admitting: Sports Medicine

## 2013-05-27 ENCOUNTER — Encounter: Payer: Self-pay | Admitting: Family Medicine

## 2013-05-27 ENCOUNTER — Ambulatory Visit (INDEPENDENT_AMBULATORY_CARE_PROVIDER_SITE_OTHER): Payer: 59 | Admitting: Family Medicine

## 2013-05-27 VITALS — BP 138/82 | HR 80 | Temp 97.4°F | Ht 66.0 in | Wt 252.1 lb

## 2013-05-27 DIAGNOSIS — IMO0002 Reserved for concepts with insufficient information to code with codable children: Secondary | ICD-10-CM

## 2013-05-27 DIAGNOSIS — I10 Essential (primary) hypertension: Secondary | ICD-10-CM

## 2013-05-27 DIAGNOSIS — E669 Obesity, unspecified: Secondary | ICD-10-CM

## 2013-05-27 DIAGNOSIS — Z23 Encounter for immunization: Secondary | ICD-10-CM

## 2013-05-27 DIAGNOSIS — E118 Type 2 diabetes mellitus with unspecified complications: Principal | ICD-10-CM

## 2013-05-27 DIAGNOSIS — E1165 Type 2 diabetes mellitus with hyperglycemia: Secondary | ICD-10-CM

## 2013-05-27 LAB — POCT GLYCOSYLATED HEMOGLOBIN (HGB A1C): Hemoglobin A1C: 9.8

## 2013-05-27 NOTE — Patient Instructions (Signed)
Your blood pressure is fine You are great with the fifty pounds of weight loss. Go to your diabetes classes You have three months.  Do better or be on insulin.

## 2013-05-28 NOTE — Assessment & Plan Note (Signed)
Good wt loss but now plateaued.  Push for continued wt loss with the leverage being avoid insulin possibly.

## 2013-05-28 NOTE — Progress Notes (Signed)
   Subjective:    Patient ID: Courtney Pena, female    DOB: 01-11-1941, 73 y.o.   MRN: [id number]  HPI  Feels good.  BS running high at home.  Wants to attend DM and nutrition classes.  Did so about 5 years ago and would like the refresher.  Strongly wants to avoid insulin. She had excellent weight loss - 50 lbs from her max weight.  Over last 4-6 months, that has leveled off Still works.  Enjoys and has no plans to quit soon.   Husband has dementia early stage which she is coping with well - and knows it will affect her lifestyle.      Review of Systems     Objective:   Physical Exam Lungs clear Cardiac RRR without m or g abd benign Ext no edema.       Assessment & Plan:

## 2013-05-28 NOTE — Assessment & Plan Note (Signed)
Poor control.  She was DM and nutrition management.  Ordered and I will continue to push for adding insulin.

## 2013-05-28 NOTE — Assessment & Plan Note (Signed)
Good control

## 2013-06-09 ENCOUNTER — Encounter: Payer: 59 | Admitting: Sports Medicine

## 2013-06-11 ENCOUNTER — Other Ambulatory Visit: Payer: Self-pay | Admitting: *Deleted

## 2013-06-11 MED ORDER — DILTIAZEM HCL ER COATED BEADS 180 MG PO CP24: ORAL_CAPSULE | ORAL | Status: DC

## 2013-06-29 ENCOUNTER — Other Ambulatory Visit: Payer: Self-pay

## 2013-06-29 DIAGNOSIS — Z1231 Encounter for screening mammogram for malignant neoplasm of breast: Secondary | ICD-10-CM

## 2013-07-10 ENCOUNTER — Ambulatory Visit: Payer: 59 | Admitting: *Deleted

## 2013-07-17 ENCOUNTER — Ambulatory Visit
Admission: RE | Admit: 2013-07-17 | Discharge: 2013-07-17 | Disposition: A | Discharge status: Home / Self Care | Payer: 59 | Source: Ambulatory Visit

## 2013-07-17 DIAGNOSIS — Z1231 Encounter for screening mammogram for malignant neoplasm of breast: Secondary | ICD-10-CM

## 2013-07-22 ENCOUNTER — Ambulatory Visit: Payer: 59 | Admitting: Dietician

## 2013-07-30 ENCOUNTER — Ambulatory Visit: Payer: 59 | Admitting: Cardiovascular Disease

## 2013-08-11 ENCOUNTER — Encounter: Payer: 59 | Attending: Family Medicine | Admitting: Dietician

## 2013-08-11 ENCOUNTER — Encounter: Payer: Self-pay | Admitting: Dietician

## 2013-08-11 VITALS — Ht 66.0 in | Wt 247.0 lb

## 2013-08-11 DIAGNOSIS — E118 Type 2 diabetes mellitus with unspecified complications: Principal | ICD-10-CM

## 2013-08-11 DIAGNOSIS — E1165 Type 2 diabetes mellitus with hyperglycemia: Secondary | ICD-10-CM | POA: Diagnosis present

## 2013-08-11 DIAGNOSIS — Z713 Dietary counseling and surveillance: Secondary | ICD-10-CM | POA: Insufficient documentation

## 2013-08-11 DIAGNOSIS — IMO0002 Reserved for concepts with insufficient information to code with codable children: Secondary | ICD-10-CM | POA: Diagnosis not present

## 2013-08-11 NOTE — Progress Notes (Signed)
Appt start time: 0930 end time:  1030.  Assessment:  Patient was seen on  08/11/13 for individual diabetes education. Pt presents with long Hx of T2DM requesting a refresher on diabetic education. She reports improving BG checks over the past month with 3-4 checks per day. She reports consistently highest BG upon waking with declining over the course of the day, lowest before bed. She is concerned she may need to alter medication timing to control this. Her MD recently wanted to begin long-acting insulin, but pt would greatly prefer to great with lifestyle modifications and avoid insulin.  Current HbA1c: 9.8  Preferred Learning Style:   No preference indicated   Learning Readiness:   Contemplating  MEDICATIONS: see list.   DIETARY INTAKE: Usual eating pattern includes 1-2 meals and 0-1 snacks per day. Everyday foods include sandwich, sugar free drinks.  Avoided foods include sugar sweetened beverages.    Pt claims to be a "sugar fiend". She tends to avoid sugar because she has difficulty controlling cravings.  24-hr recall:  B ( AM): nothing, just water. Sometimes a banana.   Snk ( AM): none  L ( PM): sandwich and chips (chix salad with LTO and spinach on whole grain bread from [employer] or corned beef with mustard and sauerkraut on wheat with cup of pineapple or [employer] fried chix sandwich with ketchup and mustard with side of french fries). Once per 2 weeks, will have sit down meal at [employer] (chix alfredo or chix scampi with salad and bread sticks (2)). Sugar free drink with meal.  Snk ( PM): rare. Sometimes may be a part of lunch meal saved for later D ( PM): if has late lunch, will skip dinner. If having dinner, will usually be chix, ground chix or ground [place] with spinach, broccoli, kale, cabbage, and brown rice or sweet potato or baked potato. Occasional mashed potato or spaghetti meal.   Or japanese carry out- rice, chix/beef mix with mixed veg. Sugar free drink or ICE  drink.  Snk ( PM): tends to avoid desserts because she has great difficulty with portion control. Lately will have frozen berries blended with water and 1-2 ounces pomegranate or cherry juice to satisfy sweet craving.  Beverages: mostly sugar free drinks like crystal light. Used to drink large amounts of tea but has since stopped due to interference with AFib. Water, no EtOH, no juices, no kool aid/lemonade, no milk ("hates" it).   Usual physical activity: very little. No active hobbies. Pt c/o pain in joints particularly ankles and knees. Pt has previously enjoyed the stationary bike, but hates treadmill.   Progress Towards Goal(s):  In progress.   Nutritional Diagnosis:  NI-5.8.2 Excessive carbohydrate intake As related to timing of meals, admitted poor portion control with sweets, high starch lunch choices.  As evidenced by HgbA1c 9.8, pt diet recall.    Intervention:  Nutrition counseling provided.  Discussed diabetes disease process and treatment options.  Discussed physiology of diabetes and role of obesity on insulin resistance.  Encouraged moderate weight reduction to improve glucose levels.  Discussed role of medications and diet in glucose control  Provided education on macronutrients on glucose levels.  Provided education on carb counting, importance of regularly scheduled meals/snacks, and meal planning  Discussed effects of physical activity on glucose levels and long-term glucose control.  Recommended 150 minutes of physical activity/week.  Reviewed patient medications.  Discussed role of medication on blood glucose and possible side effects  Discussed blood glucose monitoring and interpretation.  Discussed recommended target ranges and individual ranges.    Described short-term complications: hyper- and hypo-glycemia.  Discussed causes,symptoms, and treatment options.  Discussed prevention, detection, and treatment of long-term complications.  Discussed the role of prolonged  elevated glucose levels on body systems.  Discussed role of stress on blood glucose levels and discussed strategies to manage psychosocial issues.  Discussed recommendations for long-term diabetes self-care.  Established checklist for medical, dental, and emotional self-care.  Teaching Method Utilized:  Visual Auditory  Handouts given during visit include:  Living Well with Diabetes  Plate Method  Best Pro, Fat, and CHO rich foods  Barriers to learning/adherence to lifestyle change: hx of meal skipping, limited hx of exercise  Diabetes self-care support plan:   [facility] support group  Key aspects of care included improving meal pattern to attain more regularity of food intake. RD emphasized importance of having a high protein food at each of 3 meals, and large portions of nonstarchy vegetables with lunch and dinner. RD recommended 2-3 carb choices per meal, and at least some CHO rich food in the evening to help prevent reactive hyperglycemia. If this does not improve AM BG, alteration to medication timing may be warranted based on physician review. RD recommended exercise of pt choosing, most likely walking, swimming, cycling, or yoga due to knee and ankle pain, beginning at 30 minutes daily and increasing q 2 weeks by 5 minutes daily until 60 minutes daily is achieved.   Demonstrated degree of understanding via:  Teach Back   Monitoring/Evaluation:  Dietary intake, exercise, portion control, and body weight in 6 week(s).

## 2013-08-24 ENCOUNTER — Ambulatory Visit: Payer: 59 | Admitting: Physician Assistant

## 2013-09-03 ENCOUNTER — Encounter: Payer: Self-pay | Admitting: Nurse Practitioner

## 2013-09-03 ENCOUNTER — Ambulatory Visit (INDEPENDENT_AMBULATORY_CARE_PROVIDER_SITE_OTHER): Payer: 59 | Admitting: Nurse Practitioner

## 2013-09-03 VITALS — BP 150/90 | HR 83 | Ht 66.0 in | Wt 247.1 lb

## 2013-09-03 DIAGNOSIS — I4891 Unspecified atrial fibrillation: Secondary | ICD-10-CM

## 2013-09-03 DIAGNOSIS — I480 Paroxysmal atrial fibrillation: Secondary | ICD-10-CM

## 2013-09-03 DIAGNOSIS — K31819 Angiodysplasia of stomach and duodenum without bleeding: Secondary | ICD-10-CM

## 2013-09-03 DIAGNOSIS — I10 Essential (primary) hypertension: Secondary | ICD-10-CM

## 2013-09-03 DIAGNOSIS — E7800 Pure hypercholesterolemia, unspecified: Secondary | ICD-10-CM

## 2013-09-03 DIAGNOSIS — R079 Chest pain, unspecified: Secondary | ICD-10-CM

## 2013-09-03 LAB — CBC
HCT: 31.6 % — ABNORMAL LOW (ref 36.0–46.0)
Hemoglobin: 10.3 g/dL — ABNORMAL LOW (ref 12.0–15.0)
MCHC: 32.5 g/dL (ref 30.0–36.0)
MCV: 82.7 fl (ref 78.0–100.0)
Platelets: 285 10*3/uL (ref 150.0–400.0)
RBC: 3.82 Mil/uL — ABNORMAL LOW (ref 3.87–5.11)
RDW: 16.9 % — ABNORMAL HIGH (ref 11.5–15.5)
WBC: 7.8 10*3/uL (ref 4.0–10.5)

## 2013-09-03 LAB — BASIC METABOLIC PANEL
BUN: 26 mg/dL — ABNORMAL HIGH (ref 6–23)
CO2: 22 mEq/L (ref 19–32)
Calcium: 10 mg/dL (ref 8.4–10.5)
Chloride: 103 mEq/L (ref 96–112)
Creatinine, Ser: 1.1 mg/dL (ref 0.4–1.2)
GFR: 63.21 mL/min (ref >60.00)
Glucose, Bld: 204 mg/dL — ABNORMAL HIGH (ref 70–99)
Potassium: 4.3 mEq/L (ref 3.5–5.1)
Sodium: 134 mEq/L — ABNORMAL LOW (ref 135–145)

## 2013-09-03 LAB — HEPATIC FUNCTION PANEL
ALT: 18 U/L (ref 0–35)
AST: 17 U/L (ref 0–37)
Albumin: 3.8 g/dL (ref 3.5–5.2)
Alkaline Phosphatase: 80 U/L (ref 39–117)
Bilirubin, Direct: 0 mg/dL (ref 0.0–0.3)
Total Bilirubin: 0.5 mg/dL (ref 0.2–1.2)
Total Protein: 8 g/dL (ref 6.0–8.3)

## 2013-09-03 LAB — TROPONIN I: Troponin I: <0.3 ng/mL (ref <0.06)

## 2013-09-03 NOTE — Patient Instructions (Signed)
We will check a set of cardiac enzymes today  We will call you with that result  If ok - would use some Gas X/Mylanta, etc  We will check other lab today as well  Call the [place] office at [phone] if you have any questions, problems or concerns.

## 2013-09-03 NOTE — Progress Notes (Signed)
Courtney Pena Date of Birth: 03/23/1940 Medical Record #[id number]  History of Present Illness: Courtney Pena is seen back today for a work in visit. Seen for Dr. Johnsie Cancel. She is a 73 year old female with PAF. 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 Other issues include GERD, hypothyroidism and uncontrolled DM.   Last seen here in November of 2014.   Comes back today. Here alone. Doing ok. She is here today because of a one week history of a sharp stabbing pain under her left arm that radiates up to her shoulder and into the middle of her chest. Clearly stabbing like and fleeting. Has off and on. All at rest. None with exertion. Makes her anxious but no other associated symptoms. Not short of breath. Not sweaty or clammy. No N/V. No palpitations. Not dizzy or lightheaded. Has not tried anything to make it better or worse. Does not seem to be positional. BP is much better by her home readings.   Current Outpatient Prescriptions  Medication Sig Dispense Refill  . acetaminophen (TYLENOL) 500 MG tablet Take 500-1,000 mg by mouth as needed. As needed for headaches or arthritis      . aspirin 81 MG tablet Take 1 tablet (81 mg total) by mouth daily. (at bedtime) Do not take until 08/19/11 then take daily unless instructed otherwise by Dr. Andria Frames.  30 tablet    . betamethasone dipropionate (DIPROLENE) 0.05 % cream Apply topically 2 (two) times daily. Do not use on face  45 g  12  . Cholecalciferol (VITAMIN D3) 1000 UNITS CAPS Take 1 capsule by mouth daily.       . clobetasol cream (TEMOVATE) 0.05 %       . diltiazem (CARDIZEM CD) 180 MG 24 hr capsule Take 1 capsule by mouth  daily  90 capsule  3  . glipiZIDE (GLUCOTROL XL) 10 MG 24 hr tablet Take 1 tablet by mouth  daily  90 tablet  3  . glucose blood test strip Test three times daily  300 each  3  . hydrochlorothiazide (HYDRODIURIL) 25 MG  tablet Take 1 tablet by mouth  daily  90 tablet  3  . iron polysaccharides (FERREX 150) 150 MG capsule       . levothyroxine (SYNTHROID, LEVOTHROID) 100 MCG tablet TAKE 1 TABLET (100 MCG TOTAL) BY MOUTH DAILY.  30 tablet  3  . metFORMIN (GLUCOPHAGE) 1000 MG tablet TAKE ONE TABLET BY MOUTH TWICE DAILY WITH MEALS  180 tablet  3  . metoprolol succinate (TOPROL-XL) 100 MG 24 hr tablet Take 1 tablet by mouth  daily with a meal  90 tablet  3  . omeprazole (PRILOSEC) 40 MG capsule TAKE 1 CAPSULE (40 MG TOTAL) BY MOUTH DAILY. FOR STOMACH  90 capsule  3  . propranolol (INDERAL) 10 MG tablet TAKE 1 TABLET BY MOUTH 4 TIMES A DAY AS NEEDED  40 tablet  2  . spironolactone (ALDACTONE) 25 MG tablet Take 1 tablet by mouth  daily  90 tablet  3  . VICTOZA 18 MG/3ML SOPN Inject subcutaneously 0.58ml (1.2mg ) daily  18 pen  3  . nitroGLYCERIN (NITROSTAT) 0.4 MG SL tablet Place 1 tablet (0.4 mg total) under the tongue every 5 (five) minutes as needed for chest pain.  25 tablet  11   No current facility-administered medications for this visit.    Allergies  Allergen Reactions  .  Lisinopril Swelling    REACTION: swelling, may have had some breathing involvement.   . Losartan Potassium Swelling    REACTION: swelling - May have been some breathing.   Marland Kitchen Penicillins Shortness Of Breath and Swelling    Arm Swelling with Penicillin (Occurred in 1960s) Breathing - throat swelling with Amoxicillin (Occurred prior to 2002)  . Sulfamethoxazole Hives and Itching    "welps all over" immediately after dose  . Pioglitazone Swelling    REACTION: swelling all over body  . Nsaids     Past Medical History  Diagnosis Date  . Diabetes mellitus   . Allergy   . Arthritis   . Asthma   . GERD (gastroesophageal reflux disease)   . Hypertension   . Hyperlipidemia   . Neuromuscular disorder   . Hypothyroid   . Ulcer   . Sleep apnea     does not use CPAP  . Gastritis   . Hiatal hernia   . Obesity   . Hyperthyroidism   .  Personal history of colonic polyps 06/06/2009    cecal polyp  . Atrial flutter 12-07-2010    converted in ED with 300 mg flecainide  . Anemia due to GI blood loss 08/12/2011  . Gastric antral vascular ectasia     source for gi bleed in 07/2011 - Xarelto stopped  . CAD (coronary artery disease)     a. mild per cath in 2004;  b. nonischemic Myoview in March 2012;  c. Lex MV 1/14:  EF 66%, no ischemia    Past Surgical History  Procedure Laterality Date  . Cataract extraction    . Carpal tunnel release  2003    bilateral  . Cardiac catheterization  1999&2004  . Esophagogastroduodenoscopy  08/13/2011    Procedure: ESOPHAGOGASTRODUODENOSCOPY (EGD);  Surgeon: Gatha Mayer, MD;  Location[facility] ENDOSCOPY;  Service: Endoscopy;  Laterality: N/A;    History  Smoking status  . Former Smoker -- 2.00 packs/day for 30 years  . Types: Cigarettes  . Quit date: 02/26/1993  Smokeless tobacco  . Never Used    History  Alcohol Use No    Family History  Problem Relation Age of Onset  . Heart disease Father     Review of Systems: The review of systems is per the HPI.  All other systems were reviewed and are negative.  Physical Exam: BP 150/90  Pulse 83  Ht 5\' 6"  (1.676 m)  Wt 247 lb 1.9 oz (112.093 kg)  BMI 39.91 kg/m2 Patient is very pleasant and in no acute distress. Skin is warm and dry. Color is normal.  HEENT is unremarkable. Normocephalic/atraumatic. PERRL. Sclera are nonicteric. Neck is supple. No masses. No JVD. Lungs are clear. Cardiac exam shows a regular rate and rhythm. Abdomen is soft. Extremities are without edema. Gait and ROM are intact. No gross neurologic deficits noted.  Wt Readings from Last 3 Encounters:  09/03/13 247 lb 1.9 oz (112.093 kg)  08/11/13 247 lb (112.038 kg)  05/27/13 252 lb 1.6 oz (114.352 kg)    LABORATORY DATA/PROCEDURES: EKG today shows sinus rhythm - it is unchanged from prior studies  Lab Results  Component Value Date   WBC 8.6 12/26/2012   HGB  11.5* 12/26/2012   HCT 35.0* 12/26/2012   PLT 293 12/26/2012   GLUCOSE 182* 12/26/2012   CHOL 250* 05/14/2012   TRIG 188* 08/22/2012   HDL 39* 05/14/2012   LDLDIRECT 150* 08/22/2012   LDLCALC Comment:   Not calculated due to  Triglyceride >400. Suggest ordering Direct LDL (Unit Code: [phone]).   Total Cholesterol/HDL Ratio:CHD Risk                        Coronary Heart Disease Risk Table                                        Men       Women          1/2 Average Risk              3.4        3.3              Average Risk              5.0        4.4           2X Average Risk              9.6        7.1           3X Average Risk             23.4       11.0 Use the calculated Patient Ratio above and the CHD Risk table  to determine the patient's CHD Risk. ATP III Classification (LDL):       < 100        mg/dL         Optimal      100 - 129     mg/dL         Near or Above Optimal      130 - 159     mg/dL         Borderline High      160 - 189     mg/dL         High       > 190        mg/dL         Very High   05/14/2012   ALT 13 12/26/2012   AST 13 12/26/2012   NA 135 12/26/2012   K 4.4 12/26/2012   CL 100 12/26/2012   CREATININE 1.09 12/26/2012   BUN 27* 12/26/2012   CO2 24 12/26/2012   TSH 2.961 05/14/2012   INR 1.06 08/14/2011   HGBA1C 9.8 05/27/2013    BNP (last 3 results) No results found for this basename: PROBNP,  in the last 8760 hours   Myoview Impression from January 2014 Exercise Capacity: [place] with no exercise.  BP Response: Normal blood pressure response.  Clinical Symptoms: There is dyspnea.  ECG Impression: No significant ST segment change suggestive of ischemia.  Comparison with Prior Nuclear Study: No images to compare  Overall Impression: Normal stress nuclear study.  LV Ejection Fraction: 66%. LV Wall Motion: NL LV Function; NL Wall Motion  [name]     Assessment / Plan:  1. PAF - remains in sinus by EKG today  2. Gastric antral vascular ectasia -  No active  bleeding at this time. Chronically anemic.   3. Atypical chest pain - normal stress test last year. Sounds very atypical. No exertional symptoms. Since it has been going on for a week will check a set of cardiac enzymes today. If ok, would like for her to use some Gas  X, Mylanta, or Tylenol.  Do not think we need to proceed on with repeat stress testing at this time.   Patient is agreeable to this plan and will call if any problems develop in the interim.   Burtis Junes, RN, [place] [address] [place], [place]  [address] [phone]

## 2013-09-09 ENCOUNTER — Other Ambulatory Visit: Payer: Self-pay | Admitting: Family Medicine

## 2013-09-10 ENCOUNTER — Other Ambulatory Visit: Payer: Self-pay | Admitting: *Deleted

## 2013-09-10 DIAGNOSIS — E1165 Type 2 diabetes mellitus with hyperglycemia: Secondary | ICD-10-CM

## 2013-09-10 DIAGNOSIS — IMO0002 Reserved for concepts with insufficient information to code with codable children: Secondary | ICD-10-CM

## 2013-09-10 DIAGNOSIS — E118 Type 2 diabetes mellitus with unspecified complications: Principal | ICD-10-CM

## 2013-09-10 MED ORDER — GLIPIZIDE ER 10 MG PO TB24: ORAL_TABLET | ORAL | Status: DC

## 2013-09-14 ENCOUNTER — Other Ambulatory Visit: Payer: Self-pay | Admitting: *Deleted

## 2013-09-14 DIAGNOSIS — E118 Type 2 diabetes mellitus with unspecified complications: Principal | ICD-10-CM

## 2013-09-14 DIAGNOSIS — IMO0002 Reserved for concepts with insufficient information to code with codable children: Secondary | ICD-10-CM

## 2013-09-14 DIAGNOSIS — E1165 Type 2 diabetes mellitus with hyperglycemia: Secondary | ICD-10-CM

## 2013-09-14 MED ORDER — GLIPIZIDE ER 10 MG PO TB24: ORAL_TABLET | ORAL | Status: DC

## 2013-09-15 ENCOUNTER — Ambulatory Visit (INDEPENDENT_AMBULATORY_CARE_PROVIDER_SITE_OTHER): Payer: 59 | Admitting: Pharmacist

## 2013-09-15 ENCOUNTER — Encounter: Payer: Self-pay | Admitting: Pharmacist

## 2013-09-15 VITALS — BP 143/87 | HR 79 | Ht 66.0 in | Wt 250.1 lb

## 2013-09-15 DIAGNOSIS — IMO0002 Reserved for concepts with insufficient information to code with codable children: Secondary | ICD-10-CM

## 2013-09-15 DIAGNOSIS — E1165 Type 2 diabetes mellitus with hyperglycemia: Secondary | ICD-10-CM

## 2013-09-15 DIAGNOSIS — E118 Type 2 diabetes mellitus with unspecified complications: Principal | ICD-10-CM

## 2013-09-15 NOTE — Progress Notes (Signed)
S:    Patient arrives in good spirits. Presents for diabetes follow up. Patient reports having history of Diabetes since the year of 2000.  Patient is currently on metformin 1000 mg BID, Victoza 1.2 mg daily, and glipizide 10 mg   O:  . Lab Results  Component Value Date   HGBA1C 9.8 05/27/2013    Patient does not have meter at visit today home fasting CBG readings of 190-200s, in the past week highest has been 350 2 hour post-prandial/random CBG readings of 200s.  A/P: Longstanding diabetes.  Denies hypoglycemic events and is able to verbalize appropriate hypoglycemia management plan.  Reports adherence with medication.   Control is suboptimal due to dietary indiscretion, physical inactivity, stress, and longstanding diabetes. Patient is adamant that she does not want to start insulin at this time and would like to attempt lifestyle modification and the increase in the Victoza dose. Continue metformin 1000 mg BID, glipizide 10 mg daily, and increase Victoza to 1.8 mg daily. Can consider addition of Invokana in the future if patient still will not start insulin.   A1C due in August.  Written patient instructions provided.  Follow up in Pharmacist Clinic Visit in September.  Total time in face to face counseling 30 minutes.  Patient seen with [name], PharmD Resident.

## 2013-09-15 NOTE — Assessment & Plan Note (Signed)
Longstanding diabetes.  Denies hypoglycemic events and is able to verbalize appropriate hypoglycemia management plan.  Reports adherence with medication.  Control is suboptimal due to dietary indiscretion, physical inactivity, stress, and longstanding diabetes. Patient is adamant that she does not want to start insulin at this time and would like to attempt lifestyle modification and the increase in the Victoza dose. Continue metformin 1000 mg BID, glipizide 10 mg daily, and increase Victoza to 1.8 mg daily. Can consider addition of Invokana in the future if patient still will not start insulin.   A1C due in August.  Written patient instructions provided.  Follow up in Pharmacist Clinic Visit in September.  Total time in face to face counseling 30 minutes.  Patient seen with [name], PharmD Resident.

## 2013-09-15 NOTE — Patient Instructions (Signed)
It was nice to see you today Courtney Pena!  Increase your Victoza dose to 1.8 mg daily  Come back to Pharmacy Clinic in September.

## 2013-09-16 NOTE — Progress Notes (Signed)
Patient ID: Courtney Pena, female   DOB: 07/12/1940, 73 y.o.   MRN: [id number] Reviewed: Agree with Dr. Graylin Shiver documentation and management.

## 2013-09-18 ENCOUNTER — Ambulatory Visit (INDEPENDENT_AMBULATORY_CARE_PROVIDER_SITE_OTHER): Payer: 59 | Admitting: Family Medicine

## 2013-09-18 ENCOUNTER — Encounter: Payer: Self-pay | Admitting: Family Medicine

## 2013-09-18 VITALS — BP 135/83 | HR 89 | Temp 98.0°F | Wt 248.0 lb

## 2013-09-18 DIAGNOSIS — IMO0002 Reserved for concepts with insufficient information to code with codable children: Secondary | ICD-10-CM

## 2013-09-18 DIAGNOSIS — I10 Essential (primary) hypertension: Secondary | ICD-10-CM

## 2013-09-18 DIAGNOSIS — E1165 Type 2 diabetes mellitus with hyperglycemia: Secondary | ICD-10-CM

## 2013-09-18 DIAGNOSIS — E559 Vitamin D deficiency, unspecified: Secondary | ICD-10-CM | POA: Insufficient documentation

## 2013-09-18 DIAGNOSIS — D500 Iron deficiency anemia secondary to blood loss (chronic): Secondary | ICD-10-CM

## 2013-09-18 DIAGNOSIS — E118 Type 2 diabetes mellitus with unspecified complications: Principal | ICD-10-CM

## 2013-09-18 DIAGNOSIS — E7800 Pure hypercholesterolemia, unspecified: Secondary | ICD-10-CM

## 2013-09-18 LAB — POCT GLYCOSYLATED HEMOGLOBIN (HGB A1C): Hemoglobin A1C: 9.4

## 2013-09-18 MED ORDER — POLYSACCHARIDE IRON COMPLEX 150 MG PO CAPS: 150.0000 mg | ORAL_CAPSULE | Freq: Every day | ORAL | Status: DC

## 2013-09-18 NOTE — Progress Notes (Signed)
   Subjective:    Patient ID: Courtney Pena, female    DOB: 1940-05-14, 73 y.o.   MRN: [id number]  HPI  Recheck DM.  Still resisting insulin.  Still resisting statin.  On the great side - she has lost an impressive 50-60 lbs from her max weight.  She is compliant with her meds and feels good.  Does have added stress of caring for husband with dementia.      Review of Systems     Objective:   Physical Exam Lungs clear Cardiac RRR without m or g         Assessment & Plan:

## 2013-09-18 NOTE — Assessment & Plan Note (Signed)
Recent check suggests needs to go back on iron although does not have the classic iron deficiency indices.

## 2013-09-18 NOTE — Assessment & Plan Note (Signed)
Good control

## 2013-09-18 NOTE — Assessment & Plan Note (Signed)
Due for recheck - especially with wt loss.  Hopefully improved.

## 2013-09-18 NOTE — Patient Instructions (Addendum)
Great job with the weight loss. Keep up the good work Make sure your eye doctor sends me a copy of your visit. Call and leave a message with my nurse about the type of needles you need.  Both number. I will call with lab results.   Keep your eye out for news reports of a new cholesterol drug soon to be released in the [name].   See me in three months to recheck your blood count and A1C

## 2013-09-18 NOTE — Assessment & Plan Note (Signed)
Due for recheck of previously documented low Vit d.  Currently taking 2,000 IU daily.

## 2013-09-19 LAB — LIPID PANEL
Cholesterol: 220 mg/dL — ABNORMAL HIGH (ref 0–200)
HDL: 39 mg/dL — ABNORMAL LOW (ref >39)
LDL Cholesterol: 136 mg/dL — ABNORMAL HIGH (ref 0–99)
Total CHOL/HDL Ratio: 5.6 Ratio
Triglycerides: 226 mg/dL — ABNORMAL HIGH (ref <150)
VLDL: 45 mg/dL — ABNORMAL HIGH (ref 0–40)

## 2013-09-19 LAB — VITAMIN D 25 HYDROXY (VIT D DEFICIENCY, FRACTURES): Vit D, 25-Hydroxy: 35 ng/mL (ref 30–89)

## 2013-09-21 MED ORDER — PRAVASTATIN SODIUM 20 MG PO TABS: 20.0000 mg | ORAL_TABLET | Freq: Every day | ORAL | Status: DC

## 2013-09-21 NOTE — Addendum Note (Signed)
Addended by: [name] on: 09/21/2013 02:53 PM   Modules accepted: Orders

## 2013-09-24 ENCOUNTER — Ambulatory Visit: Payer: 59 | Admitting: *Deleted

## 2013-10-05 ENCOUNTER — Ambulatory Visit: Payer: 59 | Admitting: Cardiovascular Disease

## 2013-10-16 ENCOUNTER — Other Ambulatory Visit: Payer: Self-pay | Admitting: Family Medicine

## 2013-11-24 ENCOUNTER — Ambulatory Visit: Payer: 59 | Admitting: Cardiovascular Disease

## 2013-12-09 ENCOUNTER — Telehealth: Payer: Self-pay | Admitting: Cardiovascular Disease

## 2013-12-09 ENCOUNTER — Encounter: Payer: Self-pay | Admitting: Cardiovascular Disease

## 2013-12-09 NOTE — Telephone Encounter (Signed)
error 

## 2013-12-18 ENCOUNTER — Ambulatory Visit (INDEPENDENT_AMBULATORY_CARE_PROVIDER_SITE_OTHER): Payer: 59 | Admitting: *Deleted

## 2013-12-18 DIAGNOSIS — Z23 Encounter for immunization: Secondary | ICD-10-CM

## 2014-01-11 ENCOUNTER — Other Ambulatory Visit: Payer: Self-pay | Admitting: Family Medicine

## 2014-02-08 NOTE — Progress Notes (Signed)
Patient ID: Courtney Pena, female   DOB: 1940-05-25, 73 y.o.   MRN: [id number] Last seen by PA in July for atypical chest pain She is a 73 year old female with PAF. 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 Other issues include GERD, hypothyroidism and uncontrolled DM.   Sharp stabbing pain under her left arm that radiates up to her shoulder and into the middle of her chest. Clearly stabbing like and fleeting. Has off and on. All at rest. None with exertion. Makes her anxious but no other associated symptoms. Not short of breath. Not sweaty or clammy. No N/V. No palpitations. Not dizzy or lightheaded. Has not tried anything to make it better or worse. Does not seem to be positional. BP is much better by her home readings.   1/14 normal myovue  Myoview Impression from January 2014 Exercise Capacity: [place] with no exercise.  BP Response: Normal blood pressure response.  Clinical Symptoms: There is dyspnea.  ECG Impression: No significant ST segment change suggestive of ischemia.  Comparison with Prior Nuclear Study: No images to compare  Overall Impression: Normal stress nuclear study.  LV Ejection Fraction: 66%. LV Wall Motion: NL LV Function; NL Wall Motion  [name]   Suggested GI meds mylanta and f/u with me Still with anxiety and a whelling up feeling from epigastric area.  Husband with dementia.     ROS: Denies fever, malais, weight loss, blurry vision, decreased visual acuity, cough, sputum, SOB, hemoptysis, pleuritic pain, palpitaitons, heartburn, abdominal pain, melena, lower extremity edema, claudication, or rash.  All other systems reviewed and negative  General: Affect appropriate Overweight black female  HEENT: normal Neck supple with no adenopathy JVP normal no bruits no thyromegaly Lungs clear with no wheezing and good diaphragmatic motion Heart:   S1/S2 no murmur, no rub, gallop or click PMI normal Abdomen: benighn, BS positve, no tenderness, no AAA no bruit.  No HSM or HJR Distal pulses intact with no bruits No edema Neuro non-focal Skin warm and dry No muscular weakness   Current Outpatient Prescriptions  Medication Sig Dispense Refill  . acetaminophen (TYLENOL) 500 MG tablet Take 500-1,000 mg by mouth as needed. As needed for headaches or arthritis    . aspirin 81 MG tablet Take 1 tablet (81 mg total) by mouth daily. (at bedtime) Do not take until 08/19/11 then take daily unless instructed otherwise by Dr. Andria Frames. 30 tablet   . B-D ULTRAFINE III SHORT PEN 31G X 8 MM MISC USE AS DIRECTED 100 each prn  . betamethasone dipropionate (DIPROLENE) 0.05 % cream Apply topically 2 (two) times daily. Do not use on face 45 g 12  . Cholecalciferol (VITAMIN D3) 1000 UNITS CAPS Take 1 capsule by mouth daily.     . clobetasol cream (TEMOVATE) 0.05 %     . diltiazem (CARDIZEM CD) 180 MG 24 hr capsule Take 1 capsule by mouth  daily 90 capsule 3  . FERREX 150 150 MG capsule TAKE 1 CAPSULE BY MOUTH TWICE DAILY 60 capsule 2  . glipiZIDE (GLUCOTROL XL) 10 MG 24 hr tablet Take 1 tablet by mouth  daily 90 tablet 3  . glucose blood test strip Test three times daily 300 each 3  . hydrochlorothiazide (HYDRODIURIL) 25 MG tablet Take 1 tablet by mouth  daily 90 tablet 3  . levothyroxine (SYNTHROID, LEVOTHROID) 100 MCG tablet Take 1 tablet by mouth  daily 90 tablet 3  . metFORMIN (GLUCOPHAGE) 1000 MG tablet Take 1 tablet by mouth  twice a day with meals 180 tablet 3  . metoprolol succinate (TOPROL-XL) 100 MG 24 hr tablet Take 1 tablet (100 mg  total) by mouth daily. Take with or immediately  following a meal. 90 tablet 3  . nitroGLYCERIN (NITROSTAT) 0.4 MG SL tablet Place 1 tablet (0.4 mg total) under the tongue every 5 (five) minutes as needed for chest pain. 25 tablet 11  . omeprazole (PRILOSEC) 40 MG capsule Take 1 capsule by mouth  daily 90 capsule 3  .  pravastatin (PRAVACHOL) 20 MG tablet Take 1 tablet (20 mg total) by mouth daily. 30 tablet 12  . propranolol (INDERAL) 10 MG tablet TAKE 1 TABLET BY MOUTH 4 TIMES A DAY AS NEEDED 40 tablet 2  . spironolactone (ALDACTONE) 25 MG tablet Take 1 tablet by mouth  daily 90 tablet 3  . VICTOZA 18 MG/3ML SOPN Inject subcutaneously 0.61ml (1.2mg ) daily 18 pen 3   No current facility-administered medications for this visit.    Allergies  Lisinopril; Losartan potassium; Nsaids; Penicillins; Sulfamethoxazole; and Pioglitazone  Electrocardiogram:  7/15 no acute changes PVC SR possible old IMI/AMI  No changes from 1/14  Today SR rate 82  Sinus arrhythmia old IMI/AMI no afib   Assessment and Plan

## 2014-02-09 ENCOUNTER — Ambulatory Visit (INDEPENDENT_AMBULATORY_CARE_PROVIDER_SITE_OTHER): Payer: 59 | Admitting: Cardiovascular Disease

## 2014-02-09 ENCOUNTER — Encounter: Payer: Self-pay | Admitting: Cardiovascular Disease

## 2014-02-09 VITALS — BP 142/86 | HR 84 | Ht 66.0 in | Wt 247.0 lb

## 2014-02-09 DIAGNOSIS — E1122 Type 2 diabetes mellitus with diabetic chronic kidney disease: Secondary | ICD-10-CM

## 2014-02-09 DIAGNOSIS — I480 Paroxysmal atrial fibrillation: Secondary | ICD-10-CM

## 2014-02-09 DIAGNOSIS — E7800 Pure hypercholesterolemia, unspecified: Secondary | ICD-10-CM

## 2014-02-09 DIAGNOSIS — R002 Palpitations: Secondary | ICD-10-CM

## 2014-02-09 DIAGNOSIS — N189 Chronic kidney disease, unspecified: Secondary | ICD-10-CM

## 2014-02-09 NOTE — Assessment & Plan Note (Signed)
Cholesterol is at goal.  Continue current dose of statin and diet Rx.  No myalgias or side effects.  F/U  LFT's in 6 months. Lab Results  Component Value Date   LDLCALC 136* 09/18/2013

## 2014-02-09 NOTE — Patient Instructions (Signed)
Your physician recommends that you schedule a follow-up appointment in:  3 MONTHS WITH  DR NISHAN  Your physician recommends that you continue on your current medications as directed. Please refer to the Current Medication list given to you today.  

## 2014-02-09 NOTE — Assessment & Plan Note (Signed)
Stable with no angina and good activity level.  Continue medical Rx  

## 2014-02-09 NOTE — Assessment & Plan Note (Signed)
Discussed low carb diet.  Target hemoglobin A1c is 6.5 or less.  Continue current medications. Unable to take ARB/ACE for renal protection due to swelling

## 2014-02-09 NOTE — Assessment & Plan Note (Signed)
Well controlled.  Continue current medications and low sodium Dash type diet.    

## 2014-02-09 NOTE — Assessment & Plan Note (Signed)
PAC;s and sinus arrhythmia today no afib continue asa and beta blocker

## 2014-02-12 ENCOUNTER — Encounter: Payer: Self-pay | Admitting: Family Medicine

## 2014-02-12 ENCOUNTER — Ambulatory Visit (INDEPENDENT_AMBULATORY_CARE_PROVIDER_SITE_OTHER): Payer: 59 | Admitting: Family Medicine

## 2014-02-12 ENCOUNTER — Ambulatory Visit
Admission: RE | Admit: 2014-02-12 | Discharge: 2014-02-12 | Disposition: A | Discharge status: Home / Self Care | Payer: 59 | Source: Ambulatory Visit | Attending: Family Medicine | Admitting: Family Medicine

## 2014-02-12 VITALS — BP 135/78 | HR 75 | Ht 66.0 in | Wt 244.0 lb

## 2014-02-12 DIAGNOSIS — M1811 Unilateral primary osteoarthritis of first carpometacarpal joint, right hand: Secondary | ICD-10-CM

## 2014-02-12 DIAGNOSIS — E1122 Type 2 diabetes mellitus with diabetic chronic kidney disease: Secondary | ICD-10-CM

## 2014-02-12 DIAGNOSIS — M19041 Primary osteoarthritis, right hand: Secondary | ICD-10-CM

## 2014-02-12 DIAGNOSIS — I10 Essential (primary) hypertension: Secondary | ICD-10-CM

## 2014-02-12 DIAGNOSIS — N189 Chronic kidney disease, unspecified: Secondary | ICD-10-CM

## 2014-02-12 DIAGNOSIS — M1810 Unilateral primary osteoarthritis of first carpometacarpal joint, unspecified hand: Secondary | ICD-10-CM | POA: Insufficient documentation

## 2014-02-12 HISTORY — DX: Unilateral primary osteoarthritis of first carpometacarpal joint, unspecified hand: M18.10

## 2014-02-12 HISTORY — DX: Type 2 diabetes mellitus with diabetic chronic kidney disease: E11.22

## 2014-02-12 LAB — BASIC METABOLIC PANEL
BUN: 31 mg/dL — ABNORMAL HIGH (ref 6–23)
CO2: 22 mEq/L (ref 19–32)
Calcium: 9.8 mg/dL (ref 8.4–10.5)
Chloride: 103 mEq/L (ref 96–112)
Creat: 1.27 mg/dL — ABNORMAL HIGH (ref 0.50–1.10)
Glucose, Bld: 277 mg/dL — ABNORMAL HIGH (ref 70–99)
Potassium: 5 mEq/L (ref 3.5–5.3)
Sodium: 133 mEq/L — ABNORMAL LOW (ref 135–145)

## 2014-02-12 LAB — CBC
HCT: 33 % — ABNORMAL LOW (ref 36.0–46.0)
Hemoglobin: 10.2 g/dL — ABNORMAL LOW (ref 12.0–15.0)
MCH: 25.9 pg — ABNORMAL LOW (ref 26.0–34.0)
MCHC: 30.9 g/dL (ref 30.0–36.0)
MCV: 83.8 fL (ref 78.0–100.0)
MPV: 11.2 fL (ref 9.4–12.4)
Platelets: 338 10*3/uL (ref 150–400)
RBC: 3.94 MIL/uL (ref 3.87–5.11)
RDW: 17.1 % — ABNORMAL HIGH (ref 11.5–15.5)
WBC: 6.9 10*3/uL (ref 4.0–10.5)

## 2014-02-12 LAB — POCT GLYCOSYLATED HEMOGLOBIN (HGB A1C): Hemoglobin A1C: 9.7

## 2014-02-12 LAB — URIC ACID: Uric Acid, Serum: 10.9 mg/dL — ABNORMAL HIGH (ref 2.4–7.0)

## 2014-02-12 MED ORDER — DOXYCYCLINE HYCLATE 50 MG PO CAPS: 50.0000 mg | ORAL_CAPSULE | Freq: Two times a day (BID) | ORAL | Status: DC

## 2014-02-12 NOTE — Progress Notes (Signed)
   Subjective:    Patient ID: Courtney Pena, female    DOB: 04-Apr-1940, 73 y.o.   MRN: [id number]  HPI Stress from dealing with husband's progressive dementia DM control poor.  She keeps putting off insulin.  Promises in three months.  On the good side, she does have weight loss Thumb has acute on chronic swelling.   Review of Systems     Objective:   Physical Exam Thumb swelling at IP joint.  Some errythema.  No skin breakdown.   Lungs clear Cardiac RRR without m or g Ext 1+ edema.  See DM foot exam.       Assessment & Plan:

## 2014-02-12 NOTE — Assessment & Plan Note (Signed)
Poor control.  Still recommend insulin.  She will recheck in 3 months.  Great job on wt loss

## 2014-02-12 NOTE — Assessment & Plan Note (Signed)
Good control

## 2014-02-12 NOTE — Patient Instructions (Signed)
Warm soaks to thumb I will call Monday with labs Antibiotic for the possibility of infection See me in three months.  Insulin. Look for Altzheimers and related disorders support groups. Also google gout.  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

## 2014-02-12 NOTE — Assessment & Plan Note (Addendum)
Acute flair.  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  Uric acid of 10.9 suggest gout more likely.  Called and told to google low purine/gout diet.  Also DC HCTZ.  Will FU in one month and repeat uric acid.

## 2014-02-24 ENCOUNTER — Other Ambulatory Visit: Payer: Self-pay | Admitting: Family Medicine

## 2014-03-17 ENCOUNTER — Telehealth: Payer: Self-pay | Admitting: Gastroenterology

## 2014-03-17 NOTE — Telephone Encounter (Signed)
Left a message for patient to call back. 

## 2014-03-18 NOTE — Telephone Encounter (Signed)
Left a message for patient to call back. 

## 2014-03-23 ENCOUNTER — Telehealth: Payer: Self-pay | Admitting: Family Medicine

## 2014-03-23 DIAGNOSIS — M1000 Idiopathic gout, unspecified site: Secondary | ICD-10-CM

## 2014-03-23 DIAGNOSIS — M1A09X0 Idiopathic chronic gout, multiple sites, without tophus (tophi): Secondary | ICD-10-CM | POA: Insufficient documentation

## 2014-03-23 MED ORDER — COLCHICINE 0.6 MG PO TABS
ORAL_TABLET | ORAL | Status: DC
Start: 2014-03-23 — End: 2014-03-26

## 2014-03-23 NOTE — Telephone Encounter (Signed)
Pt having a bad flareup of gout.  Need to speak with provider urgently to possibly receive a rx for condition.  Please call back on patient's cell number

## 2014-03-23 NOTE — Assessment & Plan Note (Signed)
She had previously DCed HCTZ.  Asked her to keep her appointment scheduled for 1/29

## 2014-03-23 NOTE — Telephone Encounter (Signed)
Patient reports fluttering and dizziness with meals.  She states "i know it sounds crazy", she reports that she has been to her primary care and cardiologist and they have no answers for her.  She is a former patient of Dr. Sharlett Iles.  She will come in and see Arta Bruce, PA on 03/30/14 10:30

## 2014-03-26 ENCOUNTER — Encounter: Payer: Self-pay | Admitting: Family Medicine

## 2014-03-26 ENCOUNTER — Ambulatory Visit (INDEPENDENT_AMBULATORY_CARE_PROVIDER_SITE_OTHER): Payer: 59 | Admitting: Family Medicine

## 2014-03-26 VITALS — BP 160/90 | HR 76 | Ht 66.0 in | Wt 246.1 lb

## 2014-03-26 DIAGNOSIS — Z13820 Encounter for screening for osteoporosis: Secondary | ICD-10-CM

## 2014-03-26 DIAGNOSIS — R42 Dizziness and giddiness: Secondary | ICD-10-CM

## 2014-03-26 DIAGNOSIS — M1A29X0 Drug-induced chronic gout, multiple sites, without tophus (tophi): Secondary | ICD-10-CM

## 2014-03-26 DIAGNOSIS — M1A09X0 Idiopathic chronic gout, multiple sites, without tophus (tophi): Secondary | ICD-10-CM

## 2014-03-26 DIAGNOSIS — I10 Essential (primary) hypertension: Secondary | ICD-10-CM

## 2014-03-26 DIAGNOSIS — E2839 Other primary ovarian failure: Secondary | ICD-10-CM

## 2014-03-26 MED ORDER — MECLIZINE HCL 25 MG PO TABS: 25.0000 mg | ORAL_TABLET | Freq: Three times a day (TID) | ORAL | Status: DC | PRN

## 2014-03-26 MED ORDER — DILTIAZEM HCL ER COATED BEADS 180 MG PO CP24: ORAL_CAPSULE | ORAL | Status: DC

## 2014-03-26 MED ORDER — COLCHICINE 0.6 MG PO TABS: 0.6000 mg | ORAL_TABLET | Freq: Every day | ORAL | Status: DC

## 2014-03-26 MED ORDER — TRAMADOL HCL 50 MG PO TABS
50.0000 mg | ORAL_TABLET | Freq: Three times a day (TID) | ORAL | Status: DC | PRN
Start: 2014-03-26 — End: 2015-01-27

## 2014-03-26 NOTE — Assessment & Plan Note (Signed)
DC all diuretics.   Double diltiazem Watch BP and pulse

## 2014-03-26 NOTE — Patient Instructions (Signed)
New prescription - take one colchicine daily to prevent gout. Stop the spironolactone and stay off the HCTZ Double your diltiazem to use up what you have.  When you are due for a refill, call and I will send in the higher dose. Check your blood pressure and pulse to make sure these changes are OK for you. Other prescriptions, tramadol and meclizine. I will order a bone density scan.

## 2014-03-26 NOTE — Assessment & Plan Note (Signed)
screen 

## 2014-03-26 NOTE — Progress Notes (Signed)
   Subjective:    Patient ID: Courtney Pena, female    DOB: [birth year], 74 y.o.   MRN: [id number]  HPI  Second gout attack in 2 months.  Occurred off HCTZ.  Better with high dose colchicine. BP not surprisingly elevated off HCTZ Otherwise feels fine.   Review of Systems     Objective:   Physical Exam Wt remains well below her max BP confirmed Pulse OK Lungs clear Trace edema       Assessment & Plan:

## 2014-03-26 NOTE — Assessment & Plan Note (Signed)
Daily colchicine. Tramadol for pain DC all diuretics.

## 2014-03-29 ENCOUNTER — Encounter: Payer: Self-pay | Admitting: *Deleted

## 2014-03-30 ENCOUNTER — Ambulatory Visit: Payer: Self-pay | Admitting: Physician Assistant

## 2014-04-08 ENCOUNTER — Telehealth: Payer: Self-pay | Admitting: *Deleted

## 2014-04-08 DIAGNOSIS — M1A39X1 Chronic gout due to renal impairment, multiple sites, with tophus (tophi): Secondary | ICD-10-CM

## 2014-04-08 DIAGNOSIS — M1A09X0 Idiopathic chronic gout, multiple sites, without tophus (tophi): Secondary | ICD-10-CM

## 2014-04-08 DIAGNOSIS — M1A29X0 Drug-induced chronic gout, multiple sites, without tophus (tophi): Secondary | ICD-10-CM

## 2014-04-08 NOTE — Addendum Note (Signed)
Addended by: Johny Shears on: 04/08/2014 09:45 AM   Modules accepted: Orders

## 2014-04-08 NOTE — Telephone Encounter (Signed)
Spoke with patient to inform her of dexa scan appointment. Patient wanted Dr. Andria Frames to know that her gout is now back and the origional rx is not covered by her insurance, she said that a medication that started with an "A" for gout. Would like to know what other options for this issue are.

## 2014-04-09 MED ORDER — COLCHICINE 0.6 MG PO TABS: ORAL_TABLET | ORAL | Status: DC

## 2014-04-09 MED ORDER — ALLOPURINOL 100 MG PO TABS: 100.0000 mg | ORAL_TABLET | Freq: Every day | ORAL | Status: DC

## 2014-04-09 NOTE — Assessment & Plan Note (Signed)
Over acute flair.  Will start allopurinol and treat acute flairs with cochicine.

## 2014-04-14 ENCOUNTER — Other Ambulatory Visit: Payer: 59

## 2014-04-15 ENCOUNTER — Encounter: Payer: Self-pay | Admitting: Family Medicine

## 2014-04-15 ENCOUNTER — Ambulatory Visit (INDEPENDENT_AMBULATORY_CARE_PROVIDER_SITE_OTHER): Payer: 59 | Admitting: Family Medicine

## 2014-04-15 VITALS — BP 147/75 | HR 75 | Temp 98.5°F | Wt 245.6 lb

## 2014-04-15 DIAGNOSIS — I10 Essential (primary) hypertension: Secondary | ICD-10-CM

## 2014-04-15 NOTE — Assessment & Plan Note (Signed)
BP looks all right today - Recommend taking her Diltazem 360 mg (2 pills) in AM instead of split - Continue Metoprolol XL  - F/U with PCP in 3-4 weeks for titration - Emphasized to call or go to UC/ED if having HA and BP over 840 systolic or 375-436 diastolic.  Pt understands.

## 2014-04-15 NOTE — Progress Notes (Signed)
Courtney Pena is a 74 y.o. female who presents today for elevated BP.  Elevated BP - Pt states she was having occasional HA yesterday and checked her BP and was elevated to around "200/100s" or so.   She took her medication as prescribed including Diltazem 180 mg in AM and PM and Metoprolol 100 mg in PM.  Had been taken off two medications including HCTZ due to Gout about one month ago.  Denies any CP, SOB, diplopia, numbness, weakness, or any other Sx.  Today BP well controlled and denies any other Sx.   Past Medical History  Diagnosis Date  . Diabetes mellitus   . Allergy   . Arthritis   . Asthma   . GERD (gastroesophageal reflux disease)   . Hypertension   . Hyperlipidemia   . Neuromuscular disorder   . Hypothyroid   . Ulcer   . Sleep apnea     does not use CPAP  . Gastritis   . Hiatal hernia   . Obesity   . Hyperthyroidism   . Personal history of colonic polyps 06/06/2009    cecal polyp  . Atrial flutter 12-07-2010    converted in ED with 300 mg flecainide  . Anemia due to GI blood loss 08/12/2011  . Gastric antral vascular ectasia     source for gi bleed in 07/2011 - Xarelto stopped  . CAD (coronary artery disease)     a. mild per cath in 2004;  b. nonischemic Myoview in March 2012;  c. Lex MV 1/14:  EF 66%, no ischemia  . 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 External hemorrhoids 06/07/2010    History  Smoking status  . Former Smoker -- 2.00 packs/day for 30 years  . Types: Cigarettes  . Quit date: 02/26/1993  Smokeless tobacco  . Never Used    Family History  Problem Relation Age of Onset  . Heart disease Father     Current Outpatient Prescriptions on File Prior to Visit  Medication Sig Dispense Refill  . acetaminophen (TYLENOL) 500 MG tablet Take 500-1,000 mg by mouth as needed. As needed for headaches or arthritis    . allopurinol (ZYLOPRIM) 100 MG tablet Take 1 tablet (100 mg total) by mouth daily. Take daily to  prevent gout. 90 tablet 3  . aspirin 81 MG tablet Take 1 tablet (81 mg total) by mouth daily. (at bedtime) Do not take until 08/19/11 then take daily unless instructed otherwise by Dr. Andria Frames. 30 tablet   . B-D ULTRAFINE III SHORT PEN 31G X 8 MM MISC USE AS DIRECTED 100 each prn  . betamethasone dipropionate (DIPROLENE) 0.05 % cream Apply topically 2 (two) times daily. Do not use on face 45 g 12  . Cholecalciferol (VITAMIN D3) 1000 UNITS CAPS Take 1 capsule by mouth daily.     . clobetasol cream (TEMOVATE) 0.05 %     . colchicine 0.6 MG tablet Take one tab every hour until pain resolved, max 6 pills in 24 hours. 12 tablet 12  . diltiazem (CARDIZEM CD) 180 MG 24 hr capsule Take 2 capsules by mouth  daily 90 capsule 3  . doxycycline (VIBRAMYCIN) 50 MG capsule Take 1 capsule (50 mg total) by mouth 2 (two) times daily. 20 capsule 0  . FERREX 150 150 MG capsule TAKE 1 CAPSULE BY MOUTH TWICE DAILY 60 capsule 2  . glipiZIDE (GLUCOTROL XL) 10 MG 24 hr tablet Take 1 tablet by mouth  daily 90 tablet  3  . levothyroxine (SYNTHROID, LEVOTHROID) 100 MCG tablet Take 1 tablet by mouth  daily 90 tablet 3  . meclizine (ANTIVERT) 25 MG tablet Take 1 tablet (25 mg total) by mouth 3 (three) times daily as needed for dizziness. 30 tablet 3  . metFORMIN (GLUCOPHAGE) 1000 MG tablet Take 1 tablet by mouth  twice a day with meals 180 tablet 3  . metoprolol succinate (TOPROL-XL) 100 MG 24 hr tablet Take 1 tablet (100 mg  total) by mouth daily. Take with or immediately  following a meal. 90 tablet 3  . nitroGLYCERIN (NITROSTAT) 0.4 MG SL tablet Place 1 tablet (0.4 mg total) under the tongue every 5 (five) minutes as needed for chest pain. 25 tablet 11  . omeprazole (PRILOSEC) 40 MG capsule Take 1 capsule by mouth  daily 90 capsule 3  . ONE TOUCH ULTRA TEST test strip Test 3 times daily 150 each 12  . pravastatin (PRAVACHOL) 20 MG tablet Take 1 tablet (20 mg total) by mouth daily. 30 tablet 12  . propranolol (INDERAL) 10 MG  tablet TAKE 1 TABLET BY MOUTH 4 TIMES A DAY AS NEEDED 40 tablet 2  . traMADol (ULTRAM) 50 MG tablet Take 1 tablet (50 mg total) by mouth every 8 (eight) hours as needed. 30 tablet 2  . VICTOZA 18 MG/3ML SOPN Inject 0.80ml (1.2mg )  subcutaneously daily 18 mL 1   No current facility-administered medications on file prior to visit.    ROS: Per HPI.  All other systems reviewed and are negative.   Physical Exam Filed Vitals:   04/15/14 1046  BP: 147/75  Pulse: 75  Temp: 98.5 F (36.9 C)    Physical Examination: General appearance - alert, well appearing, and in no distress Mental status - alert, oriented to person, place, and time Chest - CTAB Heart - RRR no murmurs appreciated

## 2014-05-10 ENCOUNTER — Ambulatory Visit (INDEPENDENT_AMBULATORY_CARE_PROVIDER_SITE_OTHER): Payer: 59 | Admitting: Cardiovascular Disease

## 2014-05-10 ENCOUNTER — Encounter: Payer: Self-pay | Admitting: Cardiovascular Disease

## 2014-05-10 VITALS — BP 150/80 | HR 80 | Ht 66.0 in | Wt 246.2 lb

## 2014-05-10 DIAGNOSIS — N189 Chronic kidney disease, unspecified: Secondary | ICD-10-CM

## 2014-05-10 DIAGNOSIS — E1122 Type 2 diabetes mellitus with diabetic chronic kidney disease: Secondary | ICD-10-CM

## 2014-05-10 DIAGNOSIS — I480 Paroxysmal atrial fibrillation: Secondary | ICD-10-CM

## 2014-05-10 DIAGNOSIS — E7800 Pure hypercholesterolemia, unspecified: Secondary | ICD-10-CM

## 2014-05-10 DIAGNOSIS — K31819 Angiodysplasia of stomach and duodenum without bleeding: Secondary | ICD-10-CM

## 2014-05-10 DIAGNOSIS — I10 Essential (primary) hypertension: Secondary | ICD-10-CM

## 2014-05-10 DIAGNOSIS — I2510 Atherosclerotic heart disease of native coronary artery without angina pectoris: Secondary | ICD-10-CM

## 2014-05-10 MED ORDER — NITROGLYCERIN 0.4 MG SL SUBL: 0.4000 mg | SUBLINGUAL_TABLET | SUBLINGUAL | Status: DC | PRN

## 2014-05-10 NOTE — Progress Notes (Signed)
Patient ID: Courtney Pena, female   DOB: January 18, 1941, 74 y.o.   MRN: [id number] Last seen by PA in July for atypical chest pain She is a 74 year old female with PAF. 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 Other issues include GERD, hypothyroidism and uncontrolled DM.  HTN BP meds adjusted trying to avoid regular diuretic Due to gout in left great toe.    1/14 normal myovue  Myoview Impression from January 2014 Exercise Capacity: [place] with no exercise.  BP Response: Normal blood pressure response.  Clinical Symptoms: There is dyspnea.  ECG Impression: No significant ST segment change suggestive of ischemia.  Comparison with Prior Nuclear Study: No images to compare  Overall Impression: Normal stress nuclear study.  LV Ejection Fraction: 66%. LV Wall Motion: NL LV Function; NL Wall Motion  [name]   Suggested GI meds mylanta and f/u with me Still with anxiety and a whelling up feeling from epigastric area.  Husband with dementia.     ROS: Denies fever, malais, weight loss, blurry vision, decreased visual acuity, cough, sputum, SOB, hemoptysis, pleuritic pain, palpitaitons, heartburn, abdominal pain, melena, lower extremity edema, claudication, or rash.  All other systems reviewed and negative  General: Affect appropriate Overweight black female  HEENT: normal Neck supple with no adenopathy JVP normal no bruits no thyromegaly Lungs clear with no wheezing and good diaphragmatic motion Heart:  S1/S2 no murmur, no rub, gallop or click PMI normal Abdomen: benighn, BS positve, no tenderness, no AAA no bruit.  No HSM or HJR Distal pulses intact with no bruits No edema Neuro non-focal Skin warm and dry No muscular weakness   Current Outpatient Prescriptions  Medication Sig Dispense Refill  . acetaminophen (TYLENOL) 500 MG tablet Take 500-1,000 mg by  mouth as needed. As needed for headaches or arthritis    . aspirin 81 MG tablet Take 1 tablet (81 mg total) by mouth daily. (at bedtime) Do not take until 08/19/11 then take daily unless instructed otherwise by Dr. Andria Frames. 30 tablet   . B-D ULTRAFINE III SHORT PEN 31G X 8 MM MISC USE AS DIRECTED 100 each prn  . betamethasone dipropionate (DIPROLENE) 0.05 % cream Apply topically 2 (two) times daily. Do not use on face 45 g 12  . Cholecalciferol (VITAMIN D3) 1000 UNITS CAPS Take 1 capsule by mouth daily.     . clobetasol cream (TEMOVATE) 0.05 %     . colchicine 0.6 MG tablet Take one tab every hour until pain resolved, max 6 pills in 24 hours. 12 tablet 12  . diltiazem (CARDIZEM CD) 180 MG 24 hr capsule Take 2 capsules by mouth  daily 90 capsule 3  . FERREX 150 150 MG capsule TAKE 1 CAPSULE BY MOUTH TWICE DAILY 60 capsule 2  . glipiZIDE (GLUCOTROL XL) 10 MG 24 hr tablet Take 1 tablet by mouth  daily 90 tablet 3  . levothyroxine (SYNTHROID, LEVOTHROID) 100 MCG tablet Take 1 tablet by mouth  daily 90 tablet 3  . meclizine (ANTIVERT) 25 MG tablet Take 1 tablet (25 mg total) by mouth 3 (three) times daily as needed for dizziness. 30 tablet 3  . metFORMIN (GLUCOPHAGE) 1000 MG tablet Take 1 tablet by mouth  twice a day with meals 180 tablet 3  . metoprolol succinate (TOPROL-XL) 100 MG 24 hr tablet Take 1 tablet (100 mg  total) by mouth daily. Take with or  immediately  following a meal. 90 tablet 3  . nitroGLYCERIN (NITROSTAT) 0.4 MG SL tablet Place 1 tablet (0.4 mg total) under the tongue every 5 (five) minutes as needed for chest pain. 25 tablet 4  . omeprazole (PRILOSEC) 40 MG capsule Take 1 capsule by mouth  daily 90 capsule 3  . ONE TOUCH ULTRA TEST test strip Test 3 times daily 150 each 12  . propranolol (INDERAL) 10 MG tablet TAKE 1 TABLET BY MOUTH 4 TIMES A DAY AS NEEDED 40 tablet 2  . traMADol (ULTRAM) 50 MG tablet Take 1 tablet (50 mg total) by mouth every 8 (eight) hours as needed. 30 tablet 2  .  VICTOZA 18 MG/3ML SOPN Inject 0.38ml (1.2mg )  subcutaneously daily 18 mL 1   No current facility-administered medications for this visit.    Allergies  Lisinopril; Losartan potassium; Nsaids; Penicillins; Sulfamethoxazole; and Pioglitazone  Electrocardiogram:  7/15 no acute changes PVC SR possible old IMI/AMI  No changes from 1/14  Today SR rate 82  Sinus arrhythmia old IMI/AMI no afib   Assessment and Plan

## 2014-05-10 NOTE — Assessment & Plan Note (Signed)
Discussed low carb diet.  Target hemoglobin A1c is 6.5 or less.  Continue current medications.  

## 2014-05-10 NOTE — Patient Instructions (Signed)
Your physician wants you to follow-up in:  6 MONTHS WITH DR NISHAN  You will receive a reminder letter in the mail two months in advance. If you don't receive a letter, please call our office to schedule the follow-up appointment. Your physician recommends that you continue on your current medications as directed. Please refer to the Current Medication list given to you today. 

## 2014-05-10 NOTE — Assessment & Plan Note (Signed)
Well controlled.  Continue current medications and low sodium Dash type diet.    

## 2014-05-10 NOTE — Assessment & Plan Note (Signed)
Stable with no angina and good activity level.  Continue medical Rx Nonischemic myovue 2014

## 2014-05-10 NOTE — Assessment & Plan Note (Signed)
Maint NSR continue ASA and beta blocker  

## 2014-05-10 NOTE — Assessment & Plan Note (Signed)
Refuses Rx previous myalgias on multiple statins

## 2014-05-10 NOTE — Assessment & Plan Note (Signed)
No melena  Previous bleeding on xarelto  Tolerating aspirin

## 2014-05-12 ENCOUNTER — Other Ambulatory Visit: Payer: Self-pay | Admitting: Family Medicine

## 2014-05-12 DIAGNOSIS — I480 Paroxysmal atrial fibrillation: Secondary | ICD-10-CM

## 2014-05-13 NOTE — Assessment & Plan Note (Signed)
Refill per e request 

## 2014-06-01 ENCOUNTER — Other Ambulatory Visit: Payer: Self-pay | Admitting: Family Medicine

## 2014-06-24 ENCOUNTER — Encounter: Payer: Self-pay | Admitting: Family Medicine

## 2014-06-24 ENCOUNTER — Ambulatory Visit (INDEPENDENT_AMBULATORY_CARE_PROVIDER_SITE_OTHER): Payer: 59 | Admitting: Family Medicine

## 2014-06-24 VITALS — BP 166/83 | HR 79 | Temp 97.9°F | Ht 66.0 in | Wt 240.7 lb

## 2014-06-24 DIAGNOSIS — E559 Vitamin D deficiency, unspecified: Secondary | ICD-10-CM

## 2014-06-24 DIAGNOSIS — R5383 Other fatigue: Secondary | ICD-10-CM | POA: Insufficient documentation

## 2014-06-24 DIAGNOSIS — R531 Weakness: Secondary | ICD-10-CM | POA: Insufficient documentation

## 2014-06-24 LAB — CBC
HCT: 35.2 % — ABNORMAL LOW (ref 36.0–46.0)
Hemoglobin: 11 g/dL — ABNORMAL LOW (ref 12.0–15.0)
MCH: 25.4 pg — ABNORMAL LOW (ref 26.0–34.0)
MCHC: 31.3 g/dL (ref 30.0–36.0)
MCV: 81.3 fL (ref 78.0–100.0)
MPV: 10.5 fL (ref 8.6–12.4)
Platelets: 344 10*3/uL (ref 150–400)
RBC: 4.33 MIL/uL (ref 3.87–5.11)
RDW: 17.7 % — ABNORMAL HIGH (ref 11.5–15.5)
WBC: 7.6 10*3/uL (ref 4.0–10.5)

## 2014-06-24 LAB — COMPREHENSIVE METABOLIC PANEL
ALT: 13 U/L (ref 0–35)
AST: 14 U/L (ref 0–37)
Albumin: 4 g/dL (ref 3.5–5.2)
Alkaline Phosphatase: 81 U/L (ref 39–117)
BUN: 31 mg/dL — ABNORMAL HIGH (ref 6–23)
CO2: 22 mEq/L (ref 19–32)
Calcium: 10.2 mg/dL (ref 8.4–10.5)
Chloride: 100 mEq/L (ref 96–112)
Creat: 1.11 mg/dL — ABNORMAL HIGH (ref 0.50–1.10)
Glucose, Bld: 176 mg/dL — ABNORMAL HIGH (ref 70–99)
Potassium: 5 mEq/L (ref 3.5–5.3)
Sodium: 134 mEq/L — ABNORMAL LOW (ref 135–145)
Total Bilirubin: 0.4 mg/dL (ref 0.2–1.2)
Total Protein: 7.9 g/dL (ref 6.0–8.3)

## 2014-06-24 LAB — TSH: TSH: 3.631 u[IU]/mL (ref 0.350–4.500)

## 2014-06-24 NOTE — Progress Notes (Signed)
Courtney Pena is a 74 y.o. female who presents today for ongoing fatigue.    Ongoing fatigue - Has been worsening since the beginning of February 2016.  Stated that she has had worsening fatigue, despite ample sleep (8-10 hours per night, constant sleep w/o insomnia), exertional fatigue.  Denies feeling depressed or feeling loss of interest in activities.  Denies joint pain or HA.  Does have hx of hypothyroidism on synthroid, last TSH from 2014.  Denies recent increase in thirst or urination.    Past Medical History  Diagnosis Date  . Diabetes mellitus   . Allergy   . Arthritis   . Asthma   . GERD (gastroesophageal reflux disease)   . Hypertension   . Hyperlipidemia   . Neuromuscular disorder   . Hypothyroid   . Ulcer   . Sleep apnea     does not use CPAP  . Gastritis   . Hiatal hernia   . Obesity   . Hyperthyroidism   . Personal history of colonic polyps 06/06/2009    cecal polyp  . Atrial flutter 12-07-2010    converted in ED with 300 mg flecainide  . Anemia due to GI blood loss 08/12/2011  . Gastric antral vascular ectasia     source for gi bleed in 07/2011 - Xarelto stopped  . CAD (coronary artery disease)     a. mild per cath in 2004;  b. nonischemic Myoview in March 2012;  c. Lex MV 1/14:  EF 66%, no ischemia  . 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 External hemorrhoids 06/07/2010    Current Outpatient Prescriptions on File Prior to Visit  Medication Sig Dispense Refill  . acetaminophen (TYLENOL) 500 MG tablet Take 500-1,000 mg by mouth as needed. As needed for headaches or arthritis    . aspirin 81 MG tablet Take 1 tablet (81 mg total) by mouth daily. (at bedtime) Do not take until 08/19/11 then take daily unless instructed otherwise by Dr. Andria Frames. 30 tablet   . B-D ULTRAFINE III SHORT PEN 31G X 8 MM MISC USE AS DIRECTED 100 each prn  . betamethasone dipropionate (DIPROLENE) 0.05 % cream Apply topically 2 (two) times daily. Do  not use on face 45 g 12  . Cholecalciferol (VITAMIN D3) 1000 UNITS CAPS Take 1 capsule by mouth daily.     . clobetasol cream (TEMOVATE) 0.05 %     . colchicine 0.6 MG tablet Take one tab every hour until pain resolved, max 6 pills in 24 hours. 12 tablet 12  . diltiazem (CARDIZEM CD) 180 MG 24 hr capsule Take 1 capsule by mouth  daily 90 capsule 3  . FERREX 150 150 MG capsule TAKE 1 CAPSULE BY MOUTH TWICE DAILY 60 capsule 2  . glipiZIDE (GLUCOTROL XL) 10 MG 24 hr tablet Take 1 tablet by mouth  daily 90 tablet 3  . levothyroxine (SYNTHROID, LEVOTHROID) 100 MCG tablet Take 1 tablet by mouth  daily 90 tablet 3  . meclizine (ANTIVERT) 25 MG tablet Take 1 tablet (25 mg total) by mouth 3 (three) times daily as needed for dizziness. 30 tablet 3  . metFORMIN (GLUCOPHAGE) 1000 MG tablet Take 1 tablet by mouth  twice a day with meals 180 tablet 3  . metoprolol succinate (TOPROL-XL) 100 MG 24 hr tablet Take 1 tablet (100 mg  total) by mouth daily. Take with or immediately  following a meal. 90 tablet 3  . nitroGLYCERIN (NITROSTAT) 0.4 MG SL tablet Place  1 tablet (0.4 mg total) under the tongue every 5 (five) minutes as needed for chest pain. 25 tablet 4  . omeprazole (PRILOSEC) 40 MG capsule Take 1 capsule by mouth  daily 90 capsule 3  . ONE TOUCH ULTRA TEST test strip Test 3 times daily 150 each 12  . propranolol (INDERAL) 10 MG tablet TAKE 1 TABLET BY MOUTH 4 TIMES A DAY AS NEEDED 40 tablet 1  . traMADol (ULTRAM) 50 MG tablet Take 1 tablet (50 mg total) by mouth every 8 (eight) hours as needed. 30 tablet 2  . VICTOZA 18 MG/3ML SOPN Inject 0.40ml (1.2mg )  subcutaneously daily 18 mL 1   No current facility-administered medications on file prior to visit.    ROS: Per HPI.  All other systems reviewed and are negative.   Physical Exam Filed Vitals:   06/24/14 1026  BP: 166/83  Pulse: 79  Temp: 97.9 F (36.6 C)    Physical Examination: General appearance - alert, well appearing, and in no  distress Neck - thyroid exam: thyroid is normal in size without nodules or tenderness Chest - clear to auscultation, no wheezes, rales or rhonchi, symmetric air entry Heart - normal rate and regular rhythm    Chemistry      Component Value Date/Time   NA 133* 02/12/2014 0936   K 5.0 02/12/2014 0936   CL 103 02/12/2014 0936   CO2 22 02/12/2014 0936   BUN 31* 02/12/2014 0936   CREATININE 1.27* 02/12/2014 0936   CREATININE 1.1 09/03/2013 1153      Component Value Date/Time   CALCIUM 9.8 02/12/2014 0936   ALKPHOS 80 09/03/2013 1153   AST 17 09/03/2013 1153   ALT 18 09/03/2013 1153   BILITOT 0.5 09/03/2013 1153      Lab Results  Component Value Date   TSH 2.961 05/14/2012

## 2014-06-24 NOTE — Assessment & Plan Note (Addendum)
Etiology quite large for her fatigue.  Could be chronic fatigue syndrome commencement s/p infection or other inciting event, worsening control of diabetes mellitus, thyroid dysfunction (Hx of hypothyroidism on synthroid, last checked in 2014), blood disorder, vitamin D Deficiency, medication SE.  - Obtain TSH, CMET, CBC, and Vit D 25 - F/U with PCP in 1-2 weeks to discuss lab results and next step - If labs look normal, would start tx for chronic fatigue syndrome including CBT, graded exercise therapy, and eventually medication

## 2014-06-25 ENCOUNTER — Encounter: Payer: Self-pay | Admitting: Family Medicine

## 2014-06-25 LAB — VITAMIN D 25 HYDROXY (VIT D DEFICIENCY, FRACTURES): Vit D, 25-Hydroxy: 25 ng/mL — ABNORMAL LOW (ref 30–100)

## 2014-07-09 ENCOUNTER — Encounter: Payer: Self-pay | Admitting: Family Medicine

## 2014-07-09 ENCOUNTER — Ambulatory Visit (INDEPENDENT_AMBULATORY_CARE_PROVIDER_SITE_OTHER): Payer: 59 | Admitting: Family Medicine

## 2014-07-09 VITALS — BP 139/79 | Temp 97.5°F | Wt 243.6 lb

## 2014-07-09 DIAGNOSIS — I10 Essential (primary) hypertension: Secondary | ICD-10-CM | POA: Diagnosis not present

## 2014-07-09 DIAGNOSIS — L729 Follicular cyst of the skin and subcutaneous tissue, unspecified: Secondary | ICD-10-CM | POA: Insufficient documentation

## 2014-07-09 DIAGNOSIS — R5382 Chronic fatigue, unspecified: Secondary | ICD-10-CM

## 2014-07-09 DIAGNOSIS — E1122 Type 2 diabetes mellitus with diabetic chronic kidney disease: Secondary | ICD-10-CM

## 2014-07-09 DIAGNOSIS — N189 Chronic kidney disease, unspecified: Secondary | ICD-10-CM

## 2014-07-09 LAB — POCT GLYCOSYLATED HEMOGLOBIN (HGB A1C): Hemoglobin A1C: 9.4

## 2014-07-09 NOTE — Progress Notes (Signed)
Subjective:     Patient ID: Courtney Pena, female   DOB: [birth year], 74 y.o.   MRN: [id number] Written by: Dois Davenport, MS3 HPI [name] is a 74 year old female who presents today for follow-up of multiple medical problems.  Diabetes: She is currently taking Metformin, Glipizide, and Victoza. She reports that her blood sugars in the morning before breakfast are around 190 and in the evening after dinner around 112. She notes that these levels are improved from the values in the mid-200s in January and February, which she attributes to multiple cortisol injections for gout and plantar fasciitis. She is working on weight loss by controlling portion size and decreasing carbohydrate and sugar intake. Although she is not currently exercising, she states that she walks at work.   HTN: She reports that her home blood pressure readings range from 124/70 to 145/78. She periodically adds spironolactone or HCTZ to her current regimen when her blood pressure is high or she notices fluid buildup in her legs.  Fatigue: Patient reports feeling increasingly fatigued over the past 3 months at around 5:30-6:00pm each day after work. She is completely revived after a 20 minute nap. She sleeps 7 hours a night, waking up twice to use the bathroom. There is no change in symptoms if she sleeps for more than 7 hours. She feels refreshed when she wakes up. She has no caffeine intake. Of note, her husband has dementia and she is the primary caregiver. Due to his stage of disease, he places much blame on her and is easily angered. This has led to increased stress at home. She enjoys her work and states it is a break from the stress she experiences at home. She does not notice the fatigue symptoms during the workday.  Finger: There is a 1cm cyst over the middle phalanx of her 3rd finger. She noticed the cyst 6-7 weeks ago and has since increased in size. It was pruritic for the first day.   Review of  Systems See HPI.     Objective:   Physical Exam BP 139/79 mmHg  Temp(Src) 97.5 F (36.4 C) (Oral)  Wt 243 lb 9.6 oz (110.496 kg) Gen: well-appearing female in NAD HEENT: PERRLA, EOMI, no submandibular or cervical LAD CV: RRR, no MRG, nl S1 and S2, no leg edema Pulm: lungs CTA bilaterally, no wheezes or crackles Abdominal: normoactive bowel sounds    Assessment:     Please see Problem List.     Plan:     Please see Problem List.

## 2014-07-09 NOTE — Assessment & Plan Note (Addendum)
POC A1C today was 9.4. Patient denies numbess/tingling in extremities and vision changes.  - continue current regimen of metformin, glipizide, and victoza  - continue with portion control for weight loss   Courtney Pena, MS3 Poor control.  Unwilling to add insulin.  She will continue to work on wt loss.

## 2014-07-09 NOTE — Progress Notes (Signed)
   Subjective:    Patient ID: Courtney Pena, female    DOB: [birth year], 74 y.o.   MRN: [id number]  HPI  I was personally present and/or repeated the exam for [facility].  Agree with her documentation.   Nothing to add to the excellent documentation   Review of Systems     Objective:   Physical Exam        Assessment & Plan:

## 2014-07-09 NOTE — Patient Instructions (Signed)
Great to see you today.   Dr. Andria Frames will call you with the A1C results.

## 2014-07-09 NOTE — Assessment & Plan Note (Addendum)
Late-afternoon fatigue after work that is relieved by a nap. Labs on 06/24/14 ruled out thyroid dysfunction, anemia, and vitamin D deficiency as a cause. Probable that fatigue is due to increased stress at home due to her husband's dementia.  - discussed possibility of adding an SSRI; patient declined at this time   Dois Davenport, MS3 Likely an adjustment reaction.  She does not want to consider meds at this point.  Supportive care given and asked to search for ADRD support group

## 2014-07-09 NOTE — Assessment & Plan Note (Addendum)
6 week history of a subcutaneous cyst on the dorsolateral edge of the 3rd digit of her left hand with no associated pain. - observation for now, offered future referral to hand surgery at patient's discretion  Dois Davenport, MS3 Asymptomatic.  Hand surg if becomes symptomatic

## 2014-07-09 NOTE — Assessment & Plan Note (Addendum)
Currently taking Diltiazem and Metoprolol. Blood pressure in office slightly elevated at 140/82, possibly due to stress of discussing her husband's dementia. - continue diltiazem and metoprolol   Courtney Pena, MS3 At goal.

## 2014-08-06 ENCOUNTER — Telehealth: Payer: Self-pay | Admitting: *Deleted

## 2014-08-06 ENCOUNTER — Other Ambulatory Visit: Payer: Self-pay | Admitting: Family Medicine

## 2014-08-06 MED ORDER — DILTIAZEM HCL ER COATED BEADS 360 MG PO CP24: 360.0000 mg | ORAL_CAPSULE | Freq: Every day | ORAL | Status: DC

## 2014-08-06 NOTE — Telephone Encounter (Signed)
Pt called and stated she needs a refill on her diltiazem to go to [facility] in [place], she stated that you told her to take 2 pills a day and would like the refill to state the new dose. Pt stated that the 2 a day is why she has run out early and needs it to go to a local pharmacy. She stated that she has enough for tomorrow but that is it. Told pt I would forward to PCP and see what he could do. Katharina Caper, Norris Brumbach D, [place]

## 2014-08-06 NOTE — Telephone Encounter (Signed)
Contacted pt and informed her that her Rx (from previous phone note)  had been sent and to be sure that she knew the dose had changed. Courtney Pena, Marquesa Rath D, [place]

## 2014-08-25 ENCOUNTER — Ambulatory Visit (INDEPENDENT_AMBULATORY_CARE_PROVIDER_SITE_OTHER): Payer: 59 | Admitting: Family Medicine

## 2014-08-25 ENCOUNTER — Encounter: Payer: Self-pay | Admitting: Family Medicine

## 2014-08-25 VITALS — BP 145/68 | HR 77 | Temp 98.1°F | Ht 66.0 in | Wt 234.6 lb

## 2014-08-25 DIAGNOSIS — N189 Chronic kidney disease, unspecified: Secondary | ICD-10-CM

## 2014-08-25 DIAGNOSIS — Z636 Dependent relative needing care at home: Secondary | ICD-10-CM | POA: Diagnosis not present

## 2014-08-25 DIAGNOSIS — E669 Obesity, unspecified: Secondary | ICD-10-CM

## 2014-08-25 DIAGNOSIS — E1122 Type 2 diabetes mellitus with diabetic chronic kidney disease: Secondary | ICD-10-CM

## 2014-08-25 NOTE — Patient Instructions (Signed)
You will be due for an A1C on Aug 13. Great work on weight loss.  Let me know if you have any more FMLA paperwork.

## 2014-08-26 ENCOUNTER — Other Ambulatory Visit: Payer: Self-pay

## 2014-08-26 ENCOUNTER — Telehealth: Payer: Self-pay | Admitting: *Deleted

## 2014-08-26 DIAGNOSIS — Z1231 Encounter for screening mammogram for malignant neoplasm of breast: Secondary | ICD-10-CM

## 2014-08-26 DIAGNOSIS — Z636 Dependent relative needing care at home: Secondary | ICD-10-CM | POA: Insufficient documentation

## 2014-08-26 NOTE — Assessment & Plan Note (Signed)
Sounds like great improvement based on wt loss and home BS readings.  We are both anxious to get the next A1C

## 2014-08-26 NOTE — Assessment & Plan Note (Signed)
Completed FMLA paperwork

## 2014-08-26 NOTE — Telephone Encounter (Signed)
Last diabetic eye exam received from [facility] via fax.  Information requested from Dr. Andria Frames.  Papers placed in PCP box. Katharina Caper, April D, [place]

## 2014-08-26 NOTE — Assessment & Plan Note (Signed)
Great improvement.  Counseled to keep up the good work.

## 2014-08-26 NOTE — Progress Notes (Signed)
   Subjective:    Patient ID: Courtney Pena, female    DOB: [birth year], 74 y.o.   MRN: [id number]  HPI [name] is holding up well in a difficult situation. 1. Husband has dementia, progressive.  She has been able to work and leave him home alone for significant periods of time.  It has reached the point where he is no longer safe at home.  I filled out FMLA paperwork for her.   2. She has been brilliant recently with weight loss.  Comes in thinking it is time for an A1C.  States home blood sugars running very well.  Too early.     Review of Systems     Objective:   Physical Exam Wt noted Gen good affect Lungs clear Cardiac RRR without m or g        Assessment & Plan:

## 2014-09-02 ENCOUNTER — Telehealth: Payer: Self-pay | Admitting: Family Medicine

## 2014-09-02 NOTE — Telephone Encounter (Signed)
Pt called and needs a refill on her Victoza called in to her mail order pharmacy. jw

## 2014-09-03 MED ORDER — LIRAGLUTIDE 18 MG/3ML ~~LOC~~ SOPN: PEN_INJECTOR | SUBCUTANEOUS | Status: DC

## 2014-09-03 NOTE — Telephone Encounter (Signed)
Mrs. Duhart calling back about her Victoza refill request.  Need to have this sent to her mail order asap.  Without any medication left.

## 2014-09-03 NOTE — Telephone Encounter (Signed)
Done

## 2014-09-29 ENCOUNTER — Ambulatory Visit
Admission: RE | Admit: 2014-09-29 | Discharge: 2014-09-29 | Disposition: A | Discharge status: Home / Self Care | Payer: 59 | Source: Ambulatory Visit

## 2014-09-29 ENCOUNTER — Ambulatory Visit
Admission: RE | Admit: 2014-09-29 | Discharge: 2014-09-29 | Disposition: A | Discharge status: Home / Self Care | Payer: 59 | Source: Ambulatory Visit | Attending: Family Medicine | Admitting: Family Medicine

## 2014-09-29 DIAGNOSIS — Z1231 Encounter for screening mammogram for malignant neoplasm of breast: Secondary | ICD-10-CM

## 2014-09-29 DIAGNOSIS — E2839 Other primary ovarian failure: Secondary | ICD-10-CM

## 2014-10-25 ENCOUNTER — Other Ambulatory Visit: Payer: Self-pay | Admitting: Family Medicine

## 2014-11-02 ENCOUNTER — Telehealth: Payer: Self-pay | Admitting: Cardiovascular Disease

## 2014-11-02 NOTE — Telephone Encounter (Signed)
New message  Patient c/o Palpitations:  High priority if patient c/o lightheadedness and shortness of breath.  1. How long have you been having palpitations?off and on for about 2 weeks   2. Are you currently experiencing lightheadedness and shortness of breath? No   3. Have you checked your BP and heart rate? (document readings) Pt has not checked her BP.   4. Are you experiencing any other symptoms? Fatigue and Stress.

## 2014-11-02 NOTE — Telephone Encounter (Signed)
Phone rang multiple times with no answer, no voice mail.

## 2014-11-03 NOTE — Telephone Encounter (Signed)
PT  WANTED AN EARLIER APPT.  APPT  RESCHEDULED  TO  11-17-14 AT  3:30  WITH LORI GERHARDT NP PER PT  HAS  EXTRA STRESS  DUE  TO  HUSBAND  .Courtney Pena

## 2014-11-10 ENCOUNTER — Ambulatory Visit: Payer: 59 | Admitting: Family Medicine

## 2014-11-17 ENCOUNTER — Ambulatory Visit: Payer: 59 | Admitting: Nurse Practitioner

## 2014-12-08 ENCOUNTER — Encounter: Payer: Self-pay | Admitting: Radiology

## 2014-12-08 ENCOUNTER — Emergency Department: Payer: 59

## 2014-12-08 ENCOUNTER — Emergency Department
Admission: EM | Admit: 2014-12-08 | Discharge: 2014-12-08 | Disposition: A | Discharge status: Home / Self Care | Payer: 59 | Attending: Emergency Medicine | Admitting: Emergency Medicine

## 2014-12-08 DIAGNOSIS — S4991XA Unspecified injury of right shoulder and upper arm, initial encounter: Secondary | ICD-10-CM | POA: Insufficient documentation

## 2014-12-08 DIAGNOSIS — Z87891 Personal history of nicotine dependence: Secondary | ICD-10-CM | POA: Insufficient documentation

## 2014-12-08 DIAGNOSIS — T148 Other injury of unspecified body region: Secondary | ICD-10-CM | POA: Diagnosis not present

## 2014-12-08 DIAGNOSIS — Z7982 Long term (current) use of aspirin: Secondary | ICD-10-CM | POA: Insufficient documentation

## 2014-12-08 DIAGNOSIS — I129 Hypertensive chronic kidney disease with stage 1 through stage 4 chronic kidney disease, or unspecified chronic kidney disease: Secondary | ICD-10-CM | POA: Diagnosis not present

## 2014-12-08 DIAGNOSIS — S0990XA Unspecified injury of head, initial encounter: Secondary | ICD-10-CM | POA: Diagnosis present

## 2014-12-08 DIAGNOSIS — Y9301 Activity, walking, marching and hiking: Secondary | ICD-10-CM | POA: Diagnosis not present

## 2014-12-08 DIAGNOSIS — Z79899 Other long term (current) drug therapy: Secondary | ICD-10-CM | POA: Insufficient documentation

## 2014-12-08 DIAGNOSIS — W01198A Fall on same level from slipping, tripping and stumbling with subsequent striking against other object, initial encounter: Secondary | ICD-10-CM | POA: Diagnosis not present

## 2014-12-08 DIAGNOSIS — Y9289 Other specified places as the place of occurrence of the external cause: Secondary | ICD-10-CM | POA: Insufficient documentation

## 2014-12-08 DIAGNOSIS — T148XXA Other injury of unspecified body region, initial encounter: Secondary | ICD-10-CM

## 2014-12-08 DIAGNOSIS — N189 Chronic kidney disease, unspecified: Secondary | ICD-10-CM | POA: Diagnosis not present

## 2014-12-08 DIAGNOSIS — W19XXXA Unspecified fall, initial encounter: Secondary | ICD-10-CM

## 2014-12-08 DIAGNOSIS — Z880 Allergy status to penicillin: Secondary | ICD-10-CM | POA: Diagnosis not present

## 2014-12-08 DIAGNOSIS — Y998 Other external cause status: Secondary | ICD-10-CM | POA: Insufficient documentation

## 2014-12-08 DIAGNOSIS — S199XXA Unspecified injury of neck, initial encounter: Secondary | ICD-10-CM | POA: Insufficient documentation

## 2014-12-08 DIAGNOSIS — E1122 Type 2 diabetes mellitus with diabetic chronic kidney disease: Secondary | ICD-10-CM | POA: Diagnosis not present

## 2014-12-08 MED ORDER — ACETAMINOPHEN 325 MG PO TABS
650.0000 mg | ORAL_TABLET | Freq: Once | ORAL | Status: AC
  Administered 2014-12-08: 650 mg via ORAL
  Filled 2014-12-08: qty 2

## 2014-12-08 NOTE — Discharge Instructions (Signed)
Please seek medical attention for any high fevers, chest pain, shortness of breath, change in behavior, persistent vomiting, bloody stool or any other new or concerning symptoms. ° °Fall Prevention in the Home  °Falls can cause injuries and can affect people from all age groups. There are many simple things that you can do to make your home safe and to help prevent falls. °WHAT CAN I DO ON THE OUTSIDE OF MY HOME? °· Regularly repair the edges of walkways and driveways and fix any cracks. °· Remove high doorway thresholds. °· Trim any shrubbery on the main path into your home. °· Use bright outdoor lighting. °· Clear walkways of debris and clutter, including tools and rocks. °· Regularly check that handrails are securely fastened and in good repair. Both sides of any steps should have handrails. °· Install guardrails along the edges of any raised decks or porches. °· Have leaves, snow, and ice cleared regularly. °· Use sand or salt on walkways during winter months. °· In the garage, clean up any spills right away, including grease or oil spills. °WHAT CAN I DO IN THE BATHROOM? °· Use night lights. °· Install grab bars by the toilet and in the tub and shower. Do not use towel bars as grab bars. °· Use non-skid mats or decals on the floor of the tub or shower. °· If you need to sit down while you are in the shower, use a plastic, non-slip stool.. °· Keep the floor dry. Immediately clean up any water that spills on the floor. °· Remove soap buildup in the tub or shower on a regular basis. °· Attach bath mats securely with double-sided non-slip rug tape. °· Remove throw rugs and other tripping hazards from the floor. °WHAT CAN I DO IN THE BEDROOM? °· Use night lights. °· Make sure that a bedside light is easy to reach. °· Do not use oversized bedding that drapes onto the floor. °· Have a firm chair that has side arms to use for getting dressed. °· Remove throw rugs and other tripping hazards from the floor. °WHAT CAN I  DO IN THE KITCHEN?  °· Clean up any spills right away. °· Avoid walking on wet floors. °· Place frequently used items in easy-to-reach places. °· If you need to reach for something above you, use a sturdy step stool that has a grab bar. °· Keep electrical cables out of the way. °· Do not use floor polish or wax that makes floors slippery. If you have to use wax, make sure that it is non-skid floor wax. °· Remove throw rugs and other tripping hazards from the floor. °WHAT CAN I DO IN THE STAIRWAYS? °· Do not leave any items on the stairs. °· Make sure that there are handrails on both sides of the stairs. Fix handrails that are broken or loose. Make sure that handrails are as long as the stairways. °· Check any carpeting to make sure that it is firmly attached to the stairs. Fix any carpet that is loose or worn. °· Avoid having throw rugs at the top or bottom of stairways, or secure the rugs with carpet tape to prevent them from moving. °· Make sure that you have a light switch at the top of the stairs and the bottom of the stairs. If you do not have them, have them installed. °WHAT ARE SOME OTHER FALL PREVENTION TIPS? °· Wear closed-toe shoes that fit well and support your feet. Wear shoes that have rubber soles or low   heels. °· When you use a stepladder, make sure that it is completely opened and that the sides are firmly locked. Have someone hold the ladder while you are using it. Do not climb a closed stepladder. °· Add color or contrast paint or tape to grab bars and handrails in your home. Place contrasting color strips on the first and last steps. °· Use mobility aids as needed, such as canes, walkers, scooters, and crutches. °· Turn on lights if it is dark. Replace any light bulbs that burn out. °· Set up furniture so that there are clear paths. Keep the furniture in the same spot. °· Fix any uneven floor surfaces. °· Choose a carpet design that does not hide the edge of steps of a stairway. °· Be aware of any  and all pets. °· Review your medicines with your healthcare provider. Some medicines can cause dizziness or changes in blood pressure, which increase your risk of falling. °Talk with your health care provider about other ways that you can decrease your risk of falls. This may include working with a physical therapist or trainer to improve your strength, balance, and endurance. °  °This information is not intended to replace advice given to you by your health care provider. Make sure you discuss any questions you have with your health care provider. °  °Document Released: 02/02/2002 Document Revised: 06/29/2014 Document Reviewed: 03/19/2014 °Elsevier Interactive Patient Education ©2016 [employer] ° °

## 2014-12-08 NOTE — ED Notes (Addendum)
Pt to triage via w/c with no distress noted; daughter st pt fell PTA after tripping; st hit head on door; c/o right shoulder pain and left  side of head from impact as well as generalized HA; pt has been off coumadin for 7yrs, only taking 81mg  ASA daily

## 2014-12-08 NOTE — ED Notes (Signed)
Patient transported to CT via w/c. 

## 2014-12-08 NOTE — ED Notes (Signed)
Spoke with Dr Archie Balboa and orders obtained for imaging

## 2014-12-08 NOTE — ED Provider Notes (Signed)
[facility] Emergency Department Provider Note   ____________________________________________  Time seen: 2150  I have reviewed the triage vital signs and the nursing notes.   HISTORY  Chief Complaint Fall   History limited by: Not Limited   HPI Courtney Pena is a 74 y.o. female who presents to the emergency department today after a fall. The patient states she was walking when she tripped on a handbag that was on the floor. She denies any associated chest pain or palpitations prior to the fall. Denies any lightheadedness prior to the fall. During the fall she did hit her head against a door. She denies any loss of consciousness. She then fell onto her right side. She is complaining primarily of right shoulder pain. She does have some mild right neck pain. She was able to get up and support weight on her legs after the fall.   Past Medical History  Diagnosis Date  . Diabetes mellitus   . Allergy   . Arthritis   . Asthma   . GERD (gastroesophageal reflux disease)   . Hypertension   . Hyperlipidemia   . Neuromuscular disorder ([place])   . Hypothyroid   . Ulcer   . Sleep apnea     does not use CPAP  . Gastritis   . Hiatal hernia   . Obesity   . Hyperthyroidism   . Personal history of colonic polyps 06/06/2009    cecal polyp  . Atrial flutter ([place]) 12-07-2010    converted in ED with 300 mg flecainide  . Anemia due to GI blood loss 08/12/2011  . Gastric antral vascular ectasia     source for gi bleed in 07/2011 - Xarelto stopped  . CAD (coronary artery disease)     a. mild per cath in 2004;  b. nonischemic Myoview in March 2012;  c. Lex MV 1/14:  EF 66%, no ischemia  . Type 2 diabetes mellitus with diabetic chronic kidney disease ([place]) 04/25/2006  . External hemorrhoids 06/07/2010    Patient Active Problem List   Diagnosis Date Noted  . Caregiver burden 08/26/2014  . Subcutaneous cyst 07/09/2014  . Fatigue 06/24/2014  . Screening for  osteoporosis 03/26/2014  . Chronic gout of multiple sites 03/23/2014  . Degenerative arthritis of thumb 02/12/2014  . Unspecified vitamin D deficiency 09/18/2013  . Hypertriglyceridemia 08/22/2012  . Nummular eczema 10/10/2011  . 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 Anemia due to GI blood loss 08/12/2011  . Pulmonary nodule 12/26/2010  . Paroxysmal atrial fibrillation ([place]) 12/14/2010  . BACK PAIN 05/20/2006  . HYPOTHYROIDISM, UNSPECIFIED 04/25/2006  . Type 2 diabetes mellitus with diabetic chronic kidney disease ([place]) 04/25/2006  . HYPERCHOLESTEROLEMIA 04/25/2006  . Obesity 04/25/2006  . HYPERTENSION, BENIGN SYSTEMIC 04/25/2006  . Coronary atherosclerosis 04/25/2006  . Reflux esophagitis 04/25/2006  . DIVERTICULOSIS OF COLON 04/25/2006  . DJD, UNSPECIFIED 04/25/2006  . VERTIGO NOS OR DIZZINESS 04/25/2006  . APNEA, SLEEP 04/25/2006    Past Surgical History  Procedure Laterality Date  . Cataract extraction    . Carpal tunnel release  2003    bilateral  . Cardiac catheterization  1999&2004  . Esophagogastroduodenoscopy  08/13/2011    Procedure: ESOPHAGOGASTRODUODENOSCOPY (EGD);  Surgeon: Gatha Mayer, MD;  [facility]: [facility] ENDOSCOPY;  Service: Endoscopy;  Laterality: N/A;    Current Outpatient Rx  Name  Route  Sig  Dispense  Refill  . acetaminophen (TYLENOL) 500 MG tablet   Oral   Take 500-1,000 mg by mouth as needed.  As needed for headaches or arthritis         . aspirin 81 MG tablet   Oral   Take 1 tablet (81 mg total) by mouth daily. (at bedtime) Do not take until 08/19/11 then take daily unless instructed otherwise by Dr. Andria Frames.   30 tablet      . B-D ULTRAFINE III SHORT PEN 31G X 8 MM MISC      USE AS DIRECTED   100 each   prn   . betamethasone dipropionate (DIPROLENE) 0.05 % cream   Topical   Apply topically 2 (two) times daily. Do not use on face   45 g   12   . Cholecalciferol (VITAMIN D3) 1000 UNITS CAPS   Oral   Take 1 capsule by mouth  daily.          . clobetasol cream (TEMOVATE) 0.05 %               . colchicine 0.6 MG tablet      Take one tab every hour until pain resolved, max 6 pills in 24 hours.   12 tablet   12   . diltiazem (CARDIZEM CD) 360 MG 24 hr capsule   Oral   Take 1 capsule (360 mg total) by mouth daily.   90 capsule   3     Note change in dose.   Marland Kitchen FERREX 150 150 MG capsule      TAKE 1 CAPSULE BY MOUTH TWICE DAILY   60 capsule   2   . glipiZIDE (GLUCOTROL XL) 10 MG 24 hr tablet      Take 1 tablet by mouth  daily   90 tablet   3   . levothyroxine (SYNTHROID, LEVOTHROID) 100 MCG tablet      Take 1 tablet by mouth  daily   90 tablet   3   . Liraglutide (VICTOZA) 18 MG/3ML SOPN      Inject 0.99ml (1.2mg )  subcutaneously daily   18 mL   3   . meclizine (ANTIVERT) 25 MG tablet   Oral   Take 1 tablet (25 mg total) by mouth 3 (three) times daily as needed for dizziness.   30 tablet   3   . metFORMIN (GLUCOPHAGE) 1000 MG tablet      Take 1 tablet by mouth  twice a day with meals   180 tablet   3   . metoprolol succinate (TOPROL-XL) 100 MG 24 hr tablet      Take 1 tablet by mouth  daily with or immediately  following a meal   90 tablet   3   . nitroGLYCERIN (NITROSTAT) 0.4 MG SL tablet   Sublingual   Place 1 tablet (0.4 mg total) under the tongue every 5 (five) minutes as needed for chest pain.   25 tablet   4   . omeprazole (PRILOSEC) 40 MG capsule      Take 1 capsule by mouth  daily   90 capsule   3   . ONE TOUCH ULTRA TEST test strip      Test 3 times daily   150 each   12   . propranolol (INDERAL) 10 MG tablet      TAKE 1 TABLET BY MOUTH 4 TIMES A DAY AS NEEDED   40 tablet   1   . traMADol (ULTRAM) 50 MG tablet   Oral   Take 1 tablet (50 mg total) by mouth every 8 (eight) hours as  needed.   30 tablet   2     Allergies Lisinopril; Losartan potassium; Nsaids; Penicillins; Sulfamethoxazole; and Pioglitazone  Family History  Problem Relation  Age of Onset  . Heart disease Father     Social History Social History  Substance Use Topics  . Smoking status: Former Smoker -- 2.00 packs/day for 30 years    Types: Cigarettes    Quit date: 02/26/1993  . Smokeless tobacco: Never Used  . Alcohol Use: No    Review of Systems  Constitutional: Negative for fever. Cardiovascular: Negative for chest pain. Respiratory: Negative for shortness of breath. Gastrointestinal: Negative for abdominal pain, vomiting and diarrhea. Genitourinary: Negative for dysuria. Musculoskeletal: Negative for back pain.positive for right shoulder pain Skin: Negative for rash. Neurological: positive for mild headache  10-point ROS otherwise negative.  ____________________________________________   PHYSICAL EXAM:  VITAL SIGNS: ED Triage Vitals  Enc Vitals Group     BP 12/08/14 2025 137/54 mmHg     Pulse Rate 12/08/14 2025 60     Resp 12/08/14 2025 18     Temp 12/08/14 2025 97.9 F (36.6 C)     Temp Source 12/08/14 2025 Oral     SpO2 12/08/14 2025 97 %     Weight 12/08/14 2025 220 lb (99.791 kg)     Height 12/08/14 2025 5\' 6"  (1.676 m)     Head Cir --      Peak Flow --      Pain Score 12/08/14 2025 9   Constitutional: Alert and oriented. Well appearing and in no distress. Eyes: Conjunctivae are normal. PERRL. Normal extraocular movements. ENT   Head: Normocephalic and atraumatic.   Nose: No congestion/rhinnorhea.   Mouth/Throat: Mucous membranes are moist.   Neck: No stridor.no midline tenderness. Mild tenderness to the right side of the neck. Hematological/Lymphatic/Immunilogical: No cervical lymphadenopathy. Cardiovascular: Normal rate, regular rhythm.  No murmurs, rubs, or gallops. Respiratory: Normal respiratory effort without tachypnea nor retractions. Breath sounds are clear and equal bilaterally. No wheezes/rales/rhonchi. Gastrointestinal: Soft and nontender. No distention.  Genitourinary: Deferred Musculoskeletal:  Normal range of motion in all extremities. No joint effusions.  No lower extremity tenderness nor edema.pelvis stable. Mild tenderness to palpation of the right shoulder. No obvious deformity. Full range of motion. Neurovascularly intact distally. Neurologic:  Normal speech and language. No gross focal neurologic deficits are appreciated. Speech is normal.  Skin:  Skin is warm, dry and intact. No rash noted. Psychiatric: Mood and affect are normal. Speech and behavior are normal. Patient exhibits appropriate insight and judgment.  ____________________________________________    LABS (pertinent positives/negatives)  None  ____________________________________________   EKG  None  ____________________________________________    RADIOLOGY  Right shoulder IMPRESSION: Moderate generalized osteoarthritic change. No acute fracture or Dislocation.  CT Head/cervical spine IMPRESSION: 1. Normal for age non contrast CT appearance of the brain. 2. No acute fracture or listhesis identified in the cervical spine. Ligamentous injury is not excluded.  ____________________________________________   PROCEDURES  Procedure(s) performed: None  Critical Care performed: No  ____________________________________________   INITIAL IMPRESSION / ASSESSMENT AND PLAN / ED COURSE  Pertinent labs & imaging results that were available during my care of the patient were reviewed by me and considered in my medical decision making (see chart for details).  Patient presented to the emergency department today after mechanical fall. X-rays negative here. No other traumatic injuries identified on physical exam.  ____________________________________________   FINAL CLINICAL IMPRESSION(S) / ED DIAGNOSES  Final diagnoses:  Fall, initial encounter  Contusion     Nance Pear, MD 12/08/14 2328

## 2015-01-06 ENCOUNTER — Ambulatory Visit: Payer: 59 | Admitting: Cardiovascular Disease

## 2015-01-07 ENCOUNTER — Ambulatory Visit: Payer: 59 | Admitting: Cardiovascular Disease

## 2015-01-27 ENCOUNTER — Ambulatory Visit (INDEPENDENT_AMBULATORY_CARE_PROVIDER_SITE_OTHER): Payer: Medicare Other | Admitting: Family Medicine

## 2015-01-27 ENCOUNTER — Encounter: Payer: Self-pay | Admitting: Family Medicine

## 2015-01-27 VITALS — BP 142/78 | HR 78 | Temp 98.3°F | Ht 66.0 in | Wt 227.4 lb

## 2015-01-27 DIAGNOSIS — M1A09X0 Idiopathic chronic gout, multiple sites, without tophus (tophi): Secondary | ICD-10-CM | POA: Diagnosis not present

## 2015-01-27 DIAGNOSIS — K31819 Angiodysplasia of stomach and duodenum without bleeding: Secondary | ICD-10-CM

## 2015-01-27 DIAGNOSIS — E1122 Type 2 diabetes mellitus with diabetic chronic kidney disease: Secondary | ICD-10-CM

## 2015-01-27 DIAGNOSIS — K2100 Gastro-esophageal reflux disease with esophagitis, without bleeding: Secondary | ICD-10-CM

## 2015-01-27 DIAGNOSIS — E039 Hypothyroidism, unspecified: Secondary | ICD-10-CM | POA: Diagnosis not present

## 2015-01-27 DIAGNOSIS — E785 Hyperlipidemia, unspecified: Secondary | ICD-10-CM

## 2015-01-27 DIAGNOSIS — E1169 Type 2 diabetes mellitus with other specified complication: Secondary | ICD-10-CM

## 2015-01-27 DIAGNOSIS — I480 Paroxysmal atrial fibrillation: Secondary | ICD-10-CM

## 2015-01-27 DIAGNOSIS — I10 Essential (primary) hypertension: Secondary | ICD-10-CM

## 2015-01-27 DIAGNOSIS — Z23 Encounter for immunization: Secondary | ICD-10-CM

## 2015-01-27 DIAGNOSIS — E7800 Pure hypercholesterolemia, unspecified: Secondary | ICD-10-CM

## 2015-01-27 DIAGNOSIS — D500 Iron deficiency anemia secondary to blood loss (chronic): Secondary | ICD-10-CM

## 2015-01-27 DIAGNOSIS — Z636 Dependent relative needing care at home: Secondary | ICD-10-CM

## 2015-01-27 LAB — COMPREHENSIVE METABOLIC PANEL
ALT: 19 U/L (ref 6–29)
AST: 14 U/L (ref 10–35)
Albumin: 4 g/dL (ref 3.6–5.1)
Alkaline Phosphatase: 94 U/L (ref 33–130)
BUN: 26 mg/dL — ABNORMAL HIGH (ref 7–25)
CO2: 26 mmol/L (ref 20–31)
Calcium: 9.6 mg/dL (ref 8.6–10.4)
Chloride: 100 mmol/L (ref 98–110)
Creat: 0.9 mg/dL (ref 0.60–0.93)
Glucose, Bld: 242 mg/dL — ABNORMAL HIGH (ref 65–99)
Potassium: 4.9 mmol/L (ref 3.5–5.3)
Sodium: 136 mmol/L (ref 135–146)
Total Bilirubin: 0.2 mg/dL (ref 0.2–1.2)
Total Protein: 7.7 g/dL (ref 6.1–8.1)

## 2015-01-27 LAB — POCT GLYCOSYLATED HEMOGLOBIN (HGB A1C): Hemoglobin A1C: 9

## 2015-01-27 LAB — CBC
HCT: 35.2 % — ABNORMAL LOW (ref 36.0–46.0)
Hemoglobin: 10.5 g/dL — ABNORMAL LOW (ref 12.0–15.0)
MCH: 23.6 pg — ABNORMAL LOW (ref 26.0–34.0)
MCHC: 29.8 g/dL — ABNORMAL LOW (ref 30.0–36.0)
MCV: 79.3 fL (ref 78.0–100.0)
MPV: 10.6 fL (ref 8.6–12.4)
Platelets: 326 10*3/uL (ref 150–400)
RBC: 4.44 MIL/uL (ref 3.87–5.11)
RDW: 17.9 % — ABNORMAL HIGH (ref 11.5–15.5)
WBC: 6.8 10*3/uL (ref 4.0–10.5)

## 2015-01-27 LAB — LIPID PANEL
Cholesterol: 242 mg/dL — ABNORMAL HIGH (ref 125–200)
HDL: 52 mg/dL (ref >=46)
LDL Cholesterol: 162 mg/dL — ABNORMAL HIGH (ref <130)
Total CHOL/HDL Ratio: 4.7 Ratio (ref <=5.0)
Triglycerides: 141 mg/dL (ref <150)
VLDL: 28 mg/dL (ref <30)

## 2015-01-27 MED ORDER — TRAMADOL HCL 50 MG PO TABS: 50.0000 mg | ORAL_TABLET | Freq: Three times a day (TID) | ORAL | Status: DC | PRN

## 2015-01-27 MED ORDER — OMEPRAZOLE 40 MG PO CPDR: DELAYED_RELEASE_CAPSULE | ORAL | Status: DC

## 2015-01-27 NOTE — Assessment & Plan Note (Signed)
recheck

## 2015-01-27 NOTE — Assessment & Plan Note (Signed)
Refill omeprazole

## 2015-01-27 NOTE — Patient Instructions (Signed)
I am glad you are now more focused on taking care of yourself. Please review the medicine list and call me if question. I will call with blood test results Take the tramadol especially at night for the shoulder.  Especially take the omeprazole, it should help with the stomach pain.  When life calms down, see me for a slower, thorough going over.

## 2015-01-28 LAB — TSH: TSH: 13.015 u[IU]/mL — ABNORMAL HIGH (ref 0.350–4.500)

## 2015-01-28 MED ORDER — POLYSACCHARIDE IRON COMPLEX 150 MG PO CAPS: 150.0000 mg | ORAL_CAPSULE | Freq: Two times a day (BID) | ORAL | Status: DC

## 2015-01-28 MED ORDER — PRAVASTATIN SODIUM 40 MG PO TABS: 40.0000 mg | ORAL_TABLET | Freq: Every day | ORAL | Status: DC

## 2015-01-28 NOTE — Assessment & Plan Note (Signed)
Get back active.

## 2015-01-28 NOTE — Assessment & Plan Note (Signed)
Likely has other ectasias as the source of her chronic low grade GI loss.

## 2015-01-28 NOTE — Assessment & Plan Note (Signed)
Despite a long standing reluctance to take statins, I was pleasantly surprised when she agreed to a statin when I called and informed LDL markedly elevated.

## 2015-01-28 NOTE — Assessment & Plan Note (Signed)
Recheck labs.  Emphasized compliance with DM regimen.

## 2015-01-28 NOTE — Assessment & Plan Note (Signed)
Emphasized compliance with meds - aspirin for CVA prevent.

## 2015-01-28 NOTE — Assessment & Plan Note (Signed)
Realistically, the best I can hope for is that she becomes more compliant with her med regimen.  Once her husband dies, we can be more robust in getting her back on the track of good health.

## 2015-01-28 NOTE — Progress Notes (Signed)
   Subjective:    Patient ID: Courtney Pena, female    DOB: 09-15-1940, 74 y.o.   MRN: [id number]  HPI The elephant in the room is that her husband is on hospice, dying of cancer and dementia.  Caregiver burden is large.  She has good family support.  She is honest that she has not been taking her meds regularly.  Thus, it is no surprise than her diabetes control is worse, BP control is worse and she has not been exercising.   Wt is stable, primarily due to lack of appetite.  Depression is real and she feels well supported by family.  Husband's prognosis is weeks to months.    She is tired.  Does not particularly complain of DOE.  Certainly no chest pain.  Appetite is poor.  Bowel and bladder are normal.  No SI or HI.    Review of Systems     Objective:   Physical Exam HEENT normal Neck without masses Lungs clear Cardiac RRR without m or g Abd benigh Ext trace bilateral edema.  Diabetic foot exam done.       Assessment & Plan:

## 2015-01-28 NOTE — Assessment & Plan Note (Signed)
Hgb a bit lower, has not been taking iron.  She will restart.

## 2015-03-09 ENCOUNTER — Encounter: Payer: Self-pay | Admitting: Cardiovascular Disease

## 2015-03-09 NOTE — Progress Notes (Signed)
Patient ID: Courtney Pena, female   DOB: [birth year], 75 y.o.   MRN: [id number] 74 y.o. Last seen by PA in July for atypical chest pain History of  PAF. 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 Other issues include GERD, hypothyroidism and uncontrolled DM.  HTN BP meds adjusted trying to avoid regular diuretic due to gout in left great toe.    02/2012 normal myovue  Myoview Impression from January 2014 Exercise Capacity: Lexiscan with no exercise.  BP Response: Normal blood pressure response.  Clinical Symptoms: There is dyspnea.  ECG Impression: No significant ST segment change suggestive of ischemia.  Comparison with Prior Nuclear Study: No images to compare  Overall Impression: Normal stress nuclear study.  LV Ejection Fraction: 66%. LV Wall Motion: NL LV Function; NL Wall Motion  Courtney Pena   Still with anxiety and a whelling up feeling from epigastric area.  Husband with dementia and hospice care Not compliant with meds DM and BP not well controlled   ROS: Denies fever, malais, weight loss, blurry vision, decreased visual acuity, cough, sputum, SOB, hemoptysis, pleuritic pain, palpitaitons, heartburn, abdominal pain, melena, lower extremity edema, claudication, or rash.  All other systems reviewed and negative  General: Affect appropriate Overweight black female  HEENT: normal Neck supple with no adenopathy JVP normal no bruits no thyromegaly Lungs clear with no wheezing and good diaphragmatic motion Heart:  S1/S2 no murmur, no rub, gallop or click PMI normal Abdomen: benighn, BS positve, no tenderness, no AAA no bruit.  No HSM or HJR Distal pulses intact with no bruits No edema Neuro non-focal Skin warm and dry No muscular weakness   Current Outpatient Prescriptions  Medication Sig Dispense Refill  . aspirin 81 MG tablet Take 1 tablet (81 mg total)  by mouth daily. (at bedtime) Do not take until 08/19/11 then take daily unless instructed otherwise by Dr. Andria Pena. 30 tablet   . B-D ULTRAFINE III SHORT PEN 31G X 8 MM MISC USE AS DIRECTED 100 each prn  . Cholecalciferol (VITAMIN D3) 1000 UNITS CAPS Take 1 capsule by mouth daily.     . clobetasol cream (TEMOVATE) 0.05 %     . colchicine 0.6 MG tablet Take one tab every hour until pain resolved, max 6 pills in 24 hours. 12 tablet 12  . diltiazem (CARDIZEM CD) 360 MG 24 hr capsule Take 1 capsule (360 mg total) by mouth daily. 90 capsule 3  . glipiZIDE (GLUCOTROL XL) 10 MG 24 hr tablet Take 1 tablet by mouth  daily 90 tablet 3  . iron polysaccharides (FERREX 150) 150 MG capsule Take 1 capsule (150 mg total) by mouth 2 (two) times daily. 180 capsule 3  . levothyroxine (SYNTHROID, LEVOTHROID) 100 MCG tablet Take 1 tablet by mouth  daily 90 tablet 3  . Liraglutide (VICTOZA) 18 MG/3ML SOPN Inject 0.28ml (1.2mg )  subcutaneously daily 18 mL 3  . meclizine (ANTIVERT) 25 MG tablet Take 1 tablet (25 mg total) by mouth 3 (three) times daily as needed for dizziness. 30 tablet 3  . metFORMIN (GLUCOPHAGE) 1000 MG tablet Take 1 tablet by mouth  twice a day with meals 180 tablet 3  . metoprolol succinate (TOPROL-XL) 100 MG 24 hr tablet Take 1 tablet by mouth  daily with or immediately  following a meal 90 tablet 3  . nitroGLYCERIN (NITROSTAT) 0.4 MG SL tablet Place 1 tablet (0.4 mg total) under  the tongue every 5 (five) minutes as needed for chest pain. 25 tablet 4  . omeprazole (PRILOSEC) 40 MG capsule Take 1 capsule by mouth  daily 90 capsule 3  . ONE TOUCH ULTRA TEST test strip Test 3 times daily 150 each 12  . pravastatin (PRAVACHOL) 40 MG tablet Take 1 tablet (40 mg total) by mouth daily. 90 tablet 3  . propranolol (INDERAL) 10 MG tablet TAKE 1 TABLET BY MOUTH 4 TIMES A DAY AS NEEDED 40 tablet 1  . traMADol (ULTRAM) 50 MG tablet Take 1 tablet (50 mg total) by mouth every 8 (eight) hours as needed. 100 tablet 2    No current facility-administered medications for this visit.    Allergies  Lisinopril; Losartan potassium; Nsaids; Penicillins; Sulfamethoxazole; and Pioglitazone  Electrocardiogram:  7/15 no acute changes PVC SR possible old IMI/AMI  No changes from 1/14   02/09/14  SR rate 82  Sinus arrhythmia old IMI/AMI no afib   Assessment and Plan PAF: Chest Pain: DM: GERD: Gout: Thyroid  Courtney Pena

## 2015-03-10 ENCOUNTER — Other Ambulatory Visit: Payer: Self-pay | Admitting: *Deleted

## 2015-03-10 ENCOUNTER — Encounter: Payer: 59 | Admitting: Cardiovascular Disease

## 2015-03-10 ENCOUNTER — Telehealth: Payer: Self-pay | Admitting: Family Medicine

## 2015-03-10 MED ORDER — DILTIAZEM HCL ER COATED BEADS 360 MG PO CP24: 360.0000 mg | ORAL_CAPSULE | Freq: Every day | ORAL | Status: DC

## 2015-03-10 NOTE — Telephone Encounter (Signed)
Patient asks PCP prescription for Diltiazem 360 mg to be order to [facility] ASAP. Also, Patient asks to send prescription to  [facility] (mail pharmacy).  Please, follow up with Patient.

## 2015-03-10 NOTE — Telephone Encounter (Signed)
She stopped by the office and I handled.

## 2015-03-25 NOTE — Progress Notes (Addendum)
Patient ID: Courtney Pena, female   DOB: 05-30-1940, 75 y.o.   MRN: [id number]   74 y.o.  female with PAF. 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 Other issues include GERD, hypothyroidism and uncontrolled DM.  HTN BP meds adjusted trying to avoid regular diuretic due to gout in left great toe.    1/14 normal myovue  Myoview Impression from January 2014 Exercise Capacity: Lexiscan with no exercise.  BP Response: Normal blood pressure response.  Clinical Symptoms: There is dyspnea.  ECG Impression: No significant ST segment change suggestive of ischemia.  Comparison with Prior Nuclear Study: No images to compare  Overall Impression: Normal stress nuclear study.  LV Ejection Fraction: 66%. LV Wall Motion: NL LV Function; NL Wall Motion  Courtney Pena    Still with anxiety and a whelling up feeling from epigastric area.  Husband with dementia and is in hospice care Not compliant with her meds  More PND/Orthopnea  SSCP under left breast with some activity and at rest Going on for a couple months radiates to back    ROS: Denies fever, malais, weight loss, blurry vision, decreased visual acuity, cough, sputum, SOB, hemoptysis, pleuritic pain, palpitaitons, heartburn, abdominal pain, melena, lower extremity edema, claudication, or rash.  All other systems reviewed and negative  General: Affect appropriate Overweight black female  HEENT: normal Neck supple with no adenopathy JVP normal no bruits no thyromegaly Lungs clear with no wheezing and good diaphragmatic motion Heart:  S1/S2 no murmur, no rub, gallop or click PMI normal Abdomen: benighn, BS positve, no tenderness, no AAA no bruit.  No HSM or HJR Distal pulses intact with no bruits No edema Neuro non-focal Skin warm and dry No muscular weakness   Current Outpatient Prescriptions  Medication Sig Dispense  Refill  . aspirin 81 MG tablet Take 1 tablet (81 mg total) by mouth daily. (at bedtime) Do not take until 08/19/11 then take daily unless instructed otherwise by Dr. Andria Frames. 30 tablet   . Cholecalciferol (VITAMIN D3) 1000 UNITS CAPS Take 1 capsule by mouth daily.     . clobetasol cream (TEMOVATE) AB-123456789 % Apply 1 application topically as directed.     . Colchicine 0.6 MG CAPS Take 1 tablet by mouth daily as needed (gout/pain).    Marland Kitchen diltiazem (CARDIZEM CD) 360 MG 24 hr capsule Take 1 capsule (360 mg total) by mouth daily. 30 capsule 3  . glipiZIDE (GLUCOTROL XL) 10 MG 24 hr tablet Take 1 tablet by mouth  daily 90 tablet 3  . iron polysaccharides (FERREX 150) 150 MG capsule Take 1 capsule (150 mg total) by mouth 2 (two) times daily. 180 capsule 3  . levothyroxine (SYNTHROID, LEVOTHROID) 100 MCG tablet Take 1 tablet by mouth  daily 90 tablet 3  . Liraglutide (VICTOZA) 18 MG/3ML SOPN Inject 0.22ml (1.2mg )  subcutaneously daily 18 mL 3  . meclizine (ANTIVERT) 25 MG tablet Take 1 tablet (25 mg total) by mouth 3 (three) times daily as needed for dizziness. 30 tablet 3  . metFORMIN (GLUCOPHAGE) 1000 MG tablet Take 1 tablet by mouth  twice a day with meals 180 tablet 3  . metoprolol succinate (TOPROL-XL) 100 MG 24 hr tablet Take 1 tablet by mouth  daily with or immediately  following a meal 90 tablet 3  . nitroGLYCERIN (NITROSTAT) 0.4 MG SL tablet Place 1 tablet (0.4 mg total) under the tongue every 5 (  five) minutes as needed for chest pain. 25 tablet 4  . omeprazole (PRILOSEC) 40 MG capsule Take 1 capsule by mouth  daily 90 capsule 3  . pravastatin (PRAVACHOL) 40 MG tablet Take 1 tablet (40 mg total) by mouth daily. 90 tablet 3  . propranolol (INDERAL) 10 MG tablet Take 10 mg by mouth as directed.    . traMADol (ULTRAM) 50 MG tablet Take 50 mg by mouth every 6 (six) hours as needed for moderate pain or severe pain.     No current facility-administered medications for this visit.    Allergies  Lisinopril;  Losartan potassium; Nsaids; Penicillins; Sulfamethoxazole; and Pioglitazone  Electrocardiogram:  7/15 no acute changes PVC SR possible old IMI/AMI  No changes from 1/14  02/09/14  SR rate 82  Sinus arrhythmia old IMI/AMI no afib  03/31/15  SR rate 75  PR 210 PAC poor R wave progression old IMI   Assessment and Plan  Chest Pain / Dyspnea :  Atypical f/u lexiscan myovue BMET and BNP today  PAF: Maint NSR continue cardizem no anticoagulation given history of GI bleed HTN: Well controlled.  Continue current medications and low sodium Dash type diet.   DM: Discussed low carb diet.  Target hemoglobin A1c is 6.5 or less.  Continue current medications. GERD: low carb diet weight loss PRN H2 blocker Chol: discussed compliance with statin Cholesterol is at goal.  Continue current dose of statin and diet Rx.  No myalgias or side effects.  F/U  LFT's in 6 months. Lab Results  Component Value Date   LDLCALC 162* 01/27/2015    Gout: not active avoid daily diuretic Thyroid:  Lab Results  Component Value Date   TSH 13.015* 01/27/2015     [name]

## 2015-03-31 ENCOUNTER — Ambulatory Visit (INDEPENDENT_AMBULATORY_CARE_PROVIDER_SITE_OTHER): Payer: Medicare Other | Admitting: Cardiovascular Disease

## 2015-03-31 ENCOUNTER — Encounter: Payer: Self-pay | Admitting: Cardiovascular Disease

## 2015-03-31 VITALS — BP 140/80 | HR 73 | Ht 66.0 in | Wt 232.8 lb

## 2015-03-31 DIAGNOSIS — R0789 Other chest pain: Secondary | ICD-10-CM

## 2015-03-31 DIAGNOSIS — I480 Paroxysmal atrial fibrillation: Secondary | ICD-10-CM | POA: Diagnosis not present

## 2015-03-31 DIAGNOSIS — I10 Essential (primary) hypertension: Secondary | ICD-10-CM | POA: Diagnosis not present

## 2015-03-31 LAB — BASIC METABOLIC PANEL
BUN: 17 mg/dL (ref 7–25)
CO2: 25 mmol/L (ref 20–31)
Calcium: 9.6 mg/dL (ref 8.6–10.4)
Chloride: 99 mmol/L (ref 98–110)
Creat: 0.86 mg/dL (ref 0.60–0.93)
Glucose, Bld: 210 mg/dL — ABNORMAL HIGH (ref 65–99)
Potassium: 4.6 mmol/L (ref 3.5–5.3)
Sodium: 132 mmol/L — ABNORMAL LOW (ref 135–146)

## 2015-03-31 LAB — BRAIN NATRIURETIC PEPTIDE: Brain Natriuretic Peptide: 61 pg/mL (ref 0.0–100.0)

## 2015-03-31 NOTE — Addendum Note (Signed)
Addended by: Aris Georgia, Shray Hunley L on: 03/31/2015 09:32 AM   Modules accepted: Orders

## 2015-03-31 NOTE — Patient Instructions (Addendum)
Medication Instructions:  Your physician recommends that you continue on your current medications as directed. Please refer to the Current Medication list given to you today.  Labwork: Your physician recommends that you have lab work today  BMET and BNP.  Testing/Procedures: Your physician has requested that you have a lexiscan myoview. For further information please visit [email or web address]. Please follow instruction sheet, as given.  Follow-Up: Your physician wants you to follow-up in: 6 months with Dr. Johnsie Cancel. You will receive a reminder letter in the mail two months in advance. If you don't receive a letter, please call our office to schedule the follow-up appointment.  If you need a refill on your cardiac medications before your next appointment, please call your pharmacy.

## 2015-04-04 ENCOUNTER — Telehealth (HOSPITAL_COMMUNITY): Payer: Self-pay | Admitting: *Deleted

## 2015-04-04 NOTE — Telephone Encounter (Signed)
Patient given detailed instructions per Myocardial Perfusion Study Information Sheet for the test on 04/06/15 at 0745. Patient notified to arrive 15 minutes early and that it is imperative to arrive on time for appointment to keep from having the test rescheduled.  If you need to cancel or reschedule your appointment, please call the office within 24 hours of your appointment. Failure to do so may result in a cancellation of your appointment, and a $50 no show fee. Patient verbalized understanding.Nella Botsford, Ranae Palms

## 2015-04-06 ENCOUNTER — Ambulatory Visit (HOSPITAL_COMMUNITY): Payer: Medicare Other | Attending: Cardiovascular Disease

## 2015-04-06 DIAGNOSIS — R9439 Abnormal result of other cardiovascular function study: Secondary | ICD-10-CM | POA: Insufficient documentation

## 2015-04-06 DIAGNOSIS — R0789 Other chest pain: Secondary | ICD-10-CM | POA: Insufficient documentation

## 2015-04-06 DIAGNOSIS — I480 Paroxysmal atrial fibrillation: Secondary | ICD-10-CM | POA: Diagnosis not present

## 2015-04-06 DIAGNOSIS — E119 Type 2 diabetes mellitus without complications: Secondary | ICD-10-CM | POA: Insufficient documentation

## 2015-04-06 DIAGNOSIS — I10 Essential (primary) hypertension: Secondary | ICD-10-CM | POA: Insufficient documentation

## 2015-04-06 LAB — MYOCARDIAL PERFUSION IMAGING
LV dias vol: 116 mL
LV sys vol: 38 mL
Peak HR: 100 {beats}/min
RATE: 0.19
Rest HR: 80 {beats}/min
SDS: 0
SRS: 4
SSS: 4
TID: 1.19

## 2015-04-06 MED ORDER — REGADENOSON 0.4 MG/5ML IV SOLN
0.4000 mg | Freq: Once | INTRAVENOUS | Status: AC
  Administered 2015-04-06: 0.4 mg via INTRAVENOUS

## 2015-04-06 MED ORDER — TECHNETIUM TC 99M SESTAMIBI GENERIC - CARDIOLITE
33.0000 | Freq: Once | INTRAVENOUS | Status: AC | PRN
  Administered 2015-04-06: 33 via INTRAVENOUS

## 2015-04-06 MED ORDER — TECHNETIUM TC 99M SESTAMIBI GENERIC - CARDIOLITE
10.9000 | Freq: Once | INTRAVENOUS | Status: AC | PRN
  Administered 2015-04-06: 10.9 via INTRAVENOUS

## 2015-04-25 ENCOUNTER — Other Ambulatory Visit: Payer: Self-pay | Admitting: Family Medicine

## 2015-05-03 ENCOUNTER — Other Ambulatory Visit: Payer: Self-pay | Admitting: Family Medicine

## 2015-05-03 ENCOUNTER — Encounter: Payer: Self-pay | Admitting: Gastroenterology

## 2015-05-12 ENCOUNTER — Encounter: Payer: Self-pay | Admitting: Family Medicine

## 2015-05-12 ENCOUNTER — Ambulatory Visit (INDEPENDENT_AMBULATORY_CARE_PROVIDER_SITE_OTHER): Payer: Medicare Other | Admitting: Family Medicine

## 2015-05-12 VITALS — BP 158/79 | HR 73 | Temp 98.2°F | Ht 66.0 in | Wt 232.1 lb

## 2015-05-12 DIAGNOSIS — E7800 Pure hypercholesterolemia, unspecified: Secondary | ICD-10-CM

## 2015-05-12 DIAGNOSIS — E039 Hypothyroidism, unspecified: Secondary | ICD-10-CM

## 2015-05-12 DIAGNOSIS — E785 Hyperlipidemia, unspecified: Secondary | ICD-10-CM

## 2015-05-12 DIAGNOSIS — M1049 Other secondary gout, multiple sites: Secondary | ICD-10-CM | POA: Diagnosis not present

## 2015-05-12 DIAGNOSIS — E1169 Type 2 diabetes mellitus with other specified complication: Secondary | ICD-10-CM

## 2015-05-12 DIAGNOSIS — R079 Chest pain, unspecified: Secondary | ICD-10-CM | POA: Insufficient documentation

## 2015-05-12 DIAGNOSIS — R0789 Other chest pain: Secondary | ICD-10-CM | POA: Diagnosis not present

## 2015-05-12 DIAGNOSIS — M1A49X0 Other secondary chronic gout, multiple sites, without tophus (tophi): Secondary | ICD-10-CM

## 2015-05-12 DIAGNOSIS — D500 Iron deficiency anemia secondary to blood loss (chronic): Secondary | ICD-10-CM

## 2015-05-12 LAB — FERRITIN: Ferritin: 15 ng/mL — ABNORMAL LOW (ref 20–288)

## 2015-05-12 LAB — TSH: TSH: 8.27 mIU/L — ABNORMAL HIGH

## 2015-05-12 NOTE — Assessment & Plan Note (Addendum)
States taking levothyroxine regularly, so we can recheck TSH. Called and verified she has been taking regularly.  Will bump dose to 112

## 2015-05-12 NOTE — Assessment & Plan Note (Signed)
Check dLDL again.  Doubt much improvement since not on statin.

## 2015-05-12 NOTE — Patient Instructions (Signed)
I will call Monday with the lab and x ray results. Look up the dose of the gabapentin you have for when we talk on Monday.  I believe your chest pain is nerve irritation and that the gabapentin will help. I don't plan to do a lot more testing now because of Courtney Pena.   See me in about six weeks and we can decide if we need to do more.

## 2015-05-12 NOTE — Assessment & Plan Note (Addendum)
Recheck CBC and ferritin. Called with results.  Not taking iron every day.  Urged to do so.

## 2015-05-12 NOTE — Progress Notes (Signed)
   Subjective:    Patient ID: Courtney Pena, female    DOB: 24-Dec-1940, 75 y.o.   MRN: [id number]  HPI Courtney Pena comes in for further evaluation of chest pain present since mid Dec.  In the interim, she has seen cards and has had a reassuring stress test.  Pain is sharp, wrapping around the chest at the bra line.  Never had rash (she thought of shingles, too)  No cough or DOE.  Pain is random, not exertional, not positional and not related to meals.  No palpitations or syncope.  No trauma  She is physically and emotionally drained.  Her husband is dying from a combo of dementia and lymphoma.  He has extremely poor po intake of fluids and solids.  He was [facility] admitted to [facility] for terminal care (2 week or less life expectancy.)  She has not been good with diet or exercise.   She does have an eye doctor visit scheduled.  She is not on a statin - started, felt some cramping and stopped.     Review of Systems     Objective:   Physical Exam  Lungs clear Cardiac RRR without m or g Skin, no rash in pain distribution.  Mild hyperesthesia.         Assessment & Plan:

## 2015-05-12 NOTE — Assessment & Plan Note (Signed)
Check uric acid off hctz

## 2015-05-12 NOTE — Assessment & Plan Note (Signed)
Check chest x ray, which might show neoplasm or thoracic compression fx.  Pain sounds neuropathic.  Trial of gabapentin.

## 2015-05-13 LAB — CBC
HCT: 33.8 % — ABNORMAL LOW (ref 36.0–46.0)
Hemoglobin: 10.5 g/dL — ABNORMAL LOW (ref 12.0–15.0)
MCH: 25.7 pg — ABNORMAL LOW (ref 26.0–34.0)
MCHC: 31.1 g/dL (ref 30.0–36.0)
MCV: 82.6 fL (ref 78.0–100.0)
MPV: 10.6 fL (ref 8.6–12.4)
Platelets: 306 10*3/uL (ref 150–400)
RBC: 4.09 MIL/uL (ref 3.87–5.11)
RDW: 18.1 % — ABNORMAL HIGH (ref 11.5–15.5)
WBC: 6.7 10*3/uL (ref 4.0–10.5)

## 2015-05-13 LAB — URIC ACID: Uric Acid, Serum: 6.9 mg/dL (ref 2.4–7.0)

## 2015-05-13 LAB — LDL CHOLESTEROL, DIRECT: Direct LDL: 178 mg/dL — ABNORMAL HIGH (ref <130)

## 2015-05-13 MED ORDER — PRAVASTATIN SODIUM 10 MG PO TABS: 10.0000 mg | ORAL_TABLET | Freq: Every day | ORAL | Status: DC

## 2015-05-13 MED ORDER — LEVOTHYROXINE SODIUM 112 MCG PO TABS: 112.0000 ug | ORAL_TABLET | Freq: Every day | ORAL | Status: DC

## 2015-05-13 NOTE — Addendum Note (Signed)
Addended by: [name] on: 05/13/2015 10:02 AM   Modules accepted: Orders, Medications

## 2015-05-13 NOTE — Assessment & Plan Note (Signed)
DLDL very elevated. Called and discussed.  She is willing to try pravastatin 10 mg.  Rx sent

## 2015-05-26 ENCOUNTER — Ambulatory Visit
Admission: RE | Admit: 2015-05-26 | Discharge: 2015-05-26 | Disposition: A | Discharge status: Home / Self Care | Payer: Medicare Other | Source: Ambulatory Visit | Attending: Family Medicine | Admitting: Family Medicine

## 2015-05-26 DIAGNOSIS — R0789 Other chest pain: Secondary | ICD-10-CM

## 2015-06-15 ENCOUNTER — Encounter: Payer: Self-pay | Admitting: Gastroenterology

## 2015-06-15 ENCOUNTER — Ambulatory Visit (INDEPENDENT_AMBULATORY_CARE_PROVIDER_SITE_OTHER): Payer: Medicare Other | Admitting: Gastroenterology

## 2015-06-15 VITALS — BP 154/90 | HR 64 | Ht 64.5 in | Wt 234.1 lb

## 2015-06-15 DIAGNOSIS — R1013 Epigastric pain: Secondary | ICD-10-CM

## 2015-06-15 DIAGNOSIS — K5900 Constipation, unspecified: Secondary | ICD-10-CM

## 2015-06-15 DIAGNOSIS — R1310 Dysphagia, unspecified: Secondary | ICD-10-CM

## 2015-06-15 DIAGNOSIS — K589 Irritable bowel syndrome without diarrhea: Secondary | ICD-10-CM | POA: Diagnosis not present

## 2015-06-15 MED ORDER — PANTOPRAZOLE SODIUM 40 MG PO TBEC: 40.0000 mg | DELAYED_RELEASE_TABLET | Freq: Every day | ORAL | Status: DC

## 2015-06-15 MED ORDER — DICYCLOMINE HCL 10 MG PO CAPS: 10.0000 mg | ORAL_CAPSULE | Freq: Three times a day (TID) | ORAL | Status: DC

## 2015-06-15 NOTE — Progress Notes (Signed)
Courtney Ridl    [id number]    04/26/1940  Primary Care Physician:HENSEL,WILLIAM Arnell Sieving, MD  Referring Physician: [name], MD [address] [place], [place] [address]  Chief complaint: Epigastric pain, dyspepsia  HPI: 75 year old female previously followed by Dr. Sharlett Iles is here to establish care with me with complaints of epigastric pain for the past few months. Her husband recently passed away with gastric cancer and she feels she is having similar symptoms. She has history of gastric antral vascular ectasia. Recent hemoglobin 10.5 and she's been anemic with baseline around 10. Last EGD was in 2013. She also complained of burning sensation intermittently when she tries to swallow and sometimes feels the food just sits in her epigastric region. She has history of  A. fib and was on anticoagulation with Xarelto, discontinued by cardiology in Feb 2017. She is on oral iron, and has dark stool. Denies any blood per rectum. She is also having worsening constipation with hard stool, currently taking over-the-counter stool softener every 2-3 days with some improvement.   Outpatient Encounter Prescriptions as of 06/15/2015  Medication Sig  . aspirin 81 MG tablet Take 1 tablet (81 mg total) by mouth daily. (at bedtime) Do not take until 08/19/11 then take daily unless instructed otherwise by Dr. Andria Frames.  . Cholecalciferol (VITAMIN D3) 1000 UNITS CAPS Take 1 capsule by mouth daily.   . clobetasol cream (TEMOVATE) AB-123456789 % Apply 1 application topically as directed.   . Colchicine 0.6 MG CAPS Take 1 tablet by mouth daily as needed (gout/pain).  Marland Kitchen diltiazem (CARDIZEM CD) 360 MG 24 hr capsule Take 1 capsule (360 mg total) by mouth daily.  Marland Kitchen glipiZIDE (GLUCOTROL XL) 10 MG 24 hr tablet Take 1 tablet by mouth  daily  . iron polysaccharides (FERREX 150) 150 MG capsule Take 1 capsule (150 mg total) by mouth 2 (two) times daily.  Marland Kitchen levothyroxine (SYNTHROID, LEVOTHROID) 112  MCG tablet Take 1 tablet (112 mcg total) by mouth daily.  . Liraglutide (VICTOZA) 18 MG/3ML SOPN Inject 0.17ml (1.2mg )  subcutaneously daily  . meclizine (ANTIVERT) 25 MG tablet TAKE 1 TABLET (25 MG TOTAL) BY MOUTH 3 (THREE) TIMES DAILY AS NEEDED FOR DIZZINESS.  . metFORMIN (GLUCOPHAGE) 1000 MG tablet Take 1 tablet by mouth  twice a day with meals  . metoprolol succinate (TOPROL-XL) 100 MG 24 hr tablet Take 1 tablet by mouth  daily with or immediately  following a meal  . omeprazole (PRILOSEC) 40 MG capsule Take 1 capsule by mouth  daily  . propranolol (INDERAL) 10 MG tablet Take 10 mg by mouth as directed.  . traMADol (ULTRAM) 50 MG tablet Take 50 mg by mouth every 6 (six) hours as needed for moderate pain or severe pain.  . nitroGLYCERIN (NITROSTAT) 0.4 MG SL tablet Place 1 tablet (0.4 mg total) under the tongue every 5 (five) minutes as needed for chest pain. (Patient not taking: Reported on 06/15/2015)  . [DISCONTINUED] pravastatin (PRAVACHOL) 10 MG tablet Take 1 tablet (10 mg total) by mouth daily.   No facility-administered encounter medications on file as of 06/15/2015.    Allergies as of 06/15/2015 - Review Complete 06/15/2015  Allergen Reaction Noted  . Lisinopril Swelling 01/07/2006  . Losartan potassium Swelling 01/07/2006  . Nsaids Other (See Comments) 08/12/2011  . Penicillins Shortness Of Breath and Swelling 12/26/2010  . Sulfamethoxazole Hives and Itching 01/07/2006  . Actos [pioglitazone] Swelling 01/07/2006    Past Medical History  Diagnosis Date  . Allergy   . Arthritis   . Asthma   . GERD (gastroesophageal reflux disease)   . Hypertension   . Hyperlipidemia   . Neuromuscular disorder ([place])   . Hypothyroidism   . Ulcer   . Sleep apnea     does not use CPAP  . Gastritis   . Hiatal hernia   . Obesity   . Personal history of colonic polyps 06/06/2009    cecal polyp  . Atrial flutter ([place]) 12-07-2010    converted in ED with 300 mg flecainide  . 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 Gastric antral vascular ectasia     source for gi bleed in 07/2011 - Xarelto stopped  . CAD (coronary artery disease)     a. mild per cath in 2004;  b. nonischemic Myoview in March 2012;  c. Lex MV 1/14:  EF 66%, no ischemia  . Type 2 diabetes mellitus with diabetic chronic kidney disease ([place]) 04/25/2006  . External hemorrhoids 06/07/2010    Past Surgical History  Procedure Laterality Date  . Cataract extraction Bilateral   . Carpal tunnel release Bilateral 2003  . Cardiac catheterization  1999&2004  . Esophagogastroduodenoscopy  08/13/2011    Procedure: ESOPHAGOGASTRODUODENOSCOPY (EGD);  Surgeon: Gatha Mayer, MD;  Location: [facility] ENDOSCOPY;  Service: Endoscopy;  Laterality: N/A;  . Breast biopsy Left     Family History  Problem Relation Age of Onset  . Heart disease Father   . Diabetes Maternal Grandfather   . Diabetes Daughter     pre-diabeties  . Hypertension Mother   . Thyroid disease Daughter     x 2  . Other Mother     brain tumor-benign    Social History   Social History  . Marital Status: Married    Spouse Name: Iona Beard  . Number of Children: 4  . Years of Education: some colle   Occupational History  . Management Unemployed   Social History Main Topics  . Smoking status: Former Smoker -- 2.00 packs/day for 30 years    Types: Cigarettes    Quit date: 02/26/1993  . Smokeless tobacco: Never Used  . Alcohol Use: No  . Drug Use: No  . Sexual Activity: Not Currently   Other Topics Concern  . Not on file   Social History Narrative      Review of systems: Review of Systems  Constitutional: Negative for fever and chills.  HENT: Negative.   Eyes: Negative for blurred vision.  Respiratory: Negative for cough, shortness of breath and wheezing.   Cardiovascular: Negative for chest pain and palpitations.  Gastrointestinal: as per HPI Genitourinary: Negative for dysuria, urgency, frequency and hematuria.  Musculoskeletal: Negative  for myalgias, back pain and joint pain.  Skin: Negative for itching and rash.  Neurological: Negative for dizziness, tremors, focal weakness, seizures and loss of consciousness.  Endo/Heme/Allergies: Negative for environmental allergies.  Psychiatric/Behavioral: Negative for depression, suicidal ideas and hallucinations.  All other systems reviewed and are negative.   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 Ablated with APC on setting of 40. Otherwise the examination was normal. Retroflexed views revealed no abnormalities. The scope was then withdrawn from the patient and the procedure completed   Assessment and Plan/Recommendations:  75 year old female with history of A. Fib, Gastric antral vascular ectasia here with complaints of epigastric pain and also odynophagia/dysphagia for past few months. 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

## 2015-06-15 NOTE — Patient Instructions (Signed)
You have been scheduled for an endoscopy. Please follow written instructions given to you at your visit today. If you use inhalers (even only as needed), please bring them with you on the day of your procedure. Your physician has requested that you go to www.startemmi.com and enter the access code given to you at your visit today. This web site gives a general overview about your procedure. However, you should still follow specific instructions given to you by our office regarding your preparation for the procedure.  Discontinue omeprazole We will send protonix to your pharmacy Use Benefiber 1 tablespoon three times a day

## 2015-06-17 ENCOUNTER — Telehealth: Payer: Self-pay | Admitting: Gastroenterology

## 2015-06-17 NOTE — Telephone Encounter (Signed)
No charge. 

## 2015-06-21 ENCOUNTER — Encounter: Payer: Medicare Other | Admitting: Gastroenterology

## 2015-06-23 ENCOUNTER — Encounter: Payer: Self-pay | Admitting: Family Medicine

## 2015-06-23 ENCOUNTER — Telehealth: Payer: Self-pay | Admitting: *Deleted

## 2015-06-23 ENCOUNTER — Ambulatory Visit (INDEPENDENT_AMBULATORY_CARE_PROVIDER_SITE_OTHER): Payer: Medicare Other | Admitting: Family Medicine

## 2015-06-23 VITALS — BP 138/82 | HR 77 | Temp 98.1°F | Ht 66.0 in | Wt 231.1 lb

## 2015-06-23 DIAGNOSIS — K2100 Gastro-esophageal reflux disease with esophagitis, without bleeding: Secondary | ICD-10-CM

## 2015-06-23 DIAGNOSIS — E1122 Type 2 diabetes mellitus with diabetic chronic kidney disease: Secondary | ICD-10-CM | POA: Diagnosis not present

## 2015-06-23 DIAGNOSIS — F4321 Adjustment disorder with depressed mood: Secondary | ICD-10-CM | POA: Diagnosis not present

## 2015-06-23 DIAGNOSIS — I10 Essential (primary) hypertension: Secondary | ICD-10-CM

## 2015-06-23 LAB — POCT GLYCOSYLATED HEMOGLOBIN (HGB A1C)

## 2015-06-23 MED ORDER — PROPRANOLOL HCL 10 MG PO TABS: 10.0000 mg | ORAL_TABLET | ORAL | Status: DC

## 2015-06-23 MED ORDER — PROPRANOLOL HCL 10 MG PO TABS: 10.0000 mg | ORAL_TABLET | Freq: Every day | ORAL | Status: DC

## 2015-06-23 NOTE — Telephone Encounter (Signed)
Received call from [facility] pharmacy needing more specific directions for the Propranolol.  Please send new Rx with new directions.  Derl Barrow, RN

## 2015-06-23 NOTE — Patient Instructions (Addendum)
Please call me after the EGD and we will make plans for next steps. From a diabetes standpoint, I need to see you in three months. Hosie Poisson is the optometrist at [facility].  Say hi to her.  Also, make sure I get a copy of the eye exam.

## 2015-06-23 NOTE — Telephone Encounter (Signed)
Done

## 2015-06-24 DIAGNOSIS — F4321 Adjustment disorder with depressed mood: Secondary | ICD-10-CM | POA: Insufficient documentation

## 2015-06-24 NOTE — Progress Notes (Signed)
   Subjective:    Patient ID: [name], female    DOB: [birth year], 75 y.o.   MRN: [id number]  HPI Fu multiple issues. 1. BP in good shape. 2. Grief - now ~1 month post death of husband.  Adjusting to living alone.  Feels safe.  Good family support.  No financial imperatives to rush changes.   3. DM - A1C not at goal.  She continues to resist the notion of insulin. 4. Cholesterol.  Not able to tolerate the pravastatin daily.  Now taking every other day. 5. Obesity, I hope we are seeing the beginning of a new downward trend now that she can focus more on her own health. 6. Epigastric, Left upper quadrent fullness.  Intermitent sharp pain under left breast.  Has been seen by GI and they plan EGD.  Wonders if she also needs a CT scan or ultrasound.      Review of Systems     Objective:   Physical Exam Lungs clear Cardiac RRR without m or g Abd benign.  MILD left upper quadrent tenderness.        Assessment & Plan:

## 2015-06-24 NOTE — Assessment & Plan Note (Signed)
Perhaps the cause of her LUQ symptoms.  Await EGD before any imaging.  She is hypervigilent due to recent death of husband and his rare dx of gastric lymphoma.

## 2015-06-24 NOTE — Assessment & Plan Note (Signed)
Husband died March 2017 after long bout with lymphoma and dementia.

## 2015-06-24 NOTE — Assessment & Plan Note (Signed)
Stable

## 2015-06-24 NOTE — Assessment & Plan Note (Signed)
She feels she will be able to focus strongly on diet and exercise in the next three months.

## 2015-06-24 NOTE — Assessment & Plan Note (Signed)
Poor control.  She feels she will be able to focus strongly on diet and exercise in the next three months.

## 2015-07-04 ENCOUNTER — Other Ambulatory Visit: Payer: Self-pay | Admitting: Family Medicine

## 2015-07-05 ENCOUNTER — Ambulatory Visit (AMBULATORY_SURGERY_CENTER): Payer: Medicare Other | Admitting: Gastroenterology

## 2015-07-05 ENCOUNTER — Telehealth: Payer: Self-pay | Admitting: Cardiovascular Disease

## 2015-07-05 ENCOUNTER — Encounter: Payer: Self-pay | Admitting: Gastroenterology

## 2015-07-05 VITALS — BP 164/91 | HR 66 | Temp 97.1°F | Resp 16 | Ht 64.5 in | Wt 234.0 lb

## 2015-07-05 DIAGNOSIS — R1013 Epigastric pain: Secondary | ICD-10-CM

## 2015-07-05 LAB — GLUCOSE, CAPILLARY
Glucose-Capillary: 259 mg/dL — ABNORMAL HIGH (ref 65–99)
Glucose-Capillary: 226 mg/dL — ABNORMAL HIGH (ref 65–99)

## 2015-07-05 MED ORDER — SODIUM CHLORIDE 0.9 % IV SOLN: 500.0000 mL | INTRAVENOUS | Status: DC

## 2015-07-05 NOTE — Progress Notes (Signed)
No egg or soy allergy known to patient  No issues with past sedation with any surgeries  or procedures, no intubation problems  No diet pills per patient No home 02 use per patient  No blood thinners per patient  Pt denies issues with constipation   

## 2015-07-05 NOTE — Progress Notes (Signed)
  [facility] Post-op Note  Patient: Courtney Pena  Procedure(s) Performed: endoscopy  Patient Location: [facility] - Recovery Area  Anesthesia Type: Deep Sedation/Propofol  Level of Consciousness: awake, oriented and patient cooperative  Airway and Oxygen Therapy: Patient Spontanous Breathing  Post-op Pain: none  Post-op Assessment:  Post-op Vital signs reviewed, Patient's Cardiovascular Status Stable, Respiratory Function Stable, Patent Airway, No signs of Nausea or vomiting and Pain level controlled  Post-op Vital Signs: Reviewed and stable  Complications: No apparent anesthesia complications  Markas Aldredge E 1:38 PM

## 2015-07-05 NOTE — Telephone Encounter (Signed)
New message  Pt called states that she recently had endoscopic procedure and she was advised that during the entire procedure she the pt was in Afib lightly and that she would need an urgent appt. Pt also states that she has experienced what seems to be like dizziness. Pt request a call back to discuss seeing Dr. Johnsie Cancel immediately.

## 2015-07-05 NOTE — Patient Instructions (Addendum)
YOU HAD AN ENDOSCOPIC PROCEDURE TODAY AT [facility]:   Refer to the procedure report that was given to you for any specific questions about what was found during the examination.  If the procedure report does not answer your questions, please call your gastroenterologist to clarify.  If you requested that your care partner not be given the details of your procedure findings, then the procedure report has been included in a sealed envelope for you to review at your convenience later.  YOU SHOULD EXPECT: Some feelings of bloating in the abdomen. Passage of more gas than usual.  Walking can help get rid of the air that was put into your GI tract during the procedure and reduce the bloating. If you had a lower endoscopy (such as a colonoscopy or flexible sigmoidoscopy) you may notice spotting of blood in your stool or on the toilet paper. If you underwent a bowel prep for your procedure, you may not have a normal bowel movement for a few days.  Please Note:  You might notice some irritation and congestion in your nose or some drainage.  This is from the oxygen used during your procedure.  There is no need for concern and it should clear up in a day or so.  SYMPTOMS TO REPORT IMMEDIATELY:    Following upper endoscopy (EGD)  Vomiting of blood or coffee ground material  New chest pain or pain under the shoulder blades  Painful or persistently difficult swallowing  New shortness of breath  Fever of 100F or higher  Black, tarry-looking stools  For urgent or emergent issues, a gastroenterologist can be reached at any hour by calling [phone].   DIET: Your first meal following the procedure should be a small meal and then it is ok to progress to your normal diet. Heavy or fried foods are harder to digest and may make you feel nauseous or bloated.  Likewise, meals heavy in dairy and vegetables can increase bloating.  Drink plenty of fluids but you should avoid alcoholic beverages  for 24 hours.  ACTIVITY:  You should plan to take it easy for the rest of today and you should NOT DRIVE or use heavy machinery until tomorrow (because of the sedation medicines used during the test).    FOLLOW UP: Our staff will call the number listed on your records the next business day following your procedure to check on you and address any questions or concerns that you may have regarding the information given to you following your procedure. If we do not reach you, we will leave a message.  However, if you are feeling well and you are not experiencing any problems, there is no need to return our call.  We will assume that you have returned to your regular daily activities without incident.  If any biopsies were taken you will be contacted by phone or by letter within the next 1-3 weeks.  Please call us at [phone] if you have not heard about the biopsies in 3 weeks.    SIGNATURES/CONFIDENTIALITY: You and/or your care partner have signed paperwork which will be entered into your electronic medical record.  These signatures attest to the fact that that the information above on your After Visit Summary has been reviewed and is understood.  Full responsibility of the confidentiality of this discharge information lies with you and/or your care-partner.   Office will call with appointment to discuss colonoscopy.

## 2015-07-05 NOTE — Telephone Encounter (Signed)
Patient complaining of lightheadedness for the last week. Patient stated she has been in A. Fib since her EGD today. Patient stated her SBP is high at 164. Asked patient about her medications for BP. Patient is taking diltiazem and metoprolol. Patient has propranolol as needed for A. Fib, encouraged patient to take her propranolol. Scheduled patient an appointment with Rosaria Ferries PA this week on Thursday at [facility] office to be evaluated. Patient verbalized understanding and will call with any other questions or concerns.

## 2015-07-05 NOTE — Op Note (Signed)
[place] Patient Name: Courtney Pena Procedure Date: 07/05/2015 1:16 PM MRN: [id number] Endoscopist: Mauri Pole , MD Age: 75 Date of Birth: 1940-12-21 Gender: Female Procedure:                Upper GI endoscopy Indications:              Epigastric abdominal pain Medicines:                Monitored Anesthesia Care Procedure:                Pre-Anesthesia Assessment:                           - Prior to the procedure, a History and Physical                            was performed, and patient medications and                            allergies were reviewed. The patient's tolerance of                            previous anesthesia was also reviewed. The risks                            and benefits of the procedure and the sedation                            options and risks were discussed with the patient.                            All questions were answered, and informed consent                            was obtained. Prior Anticoagulants: The patient                            last took Xarelto (rivaroxaban) 2 days prior to the                            procedure. ASA Grade Assessment: II - A patient                            with mild systemic disease. After reviewing the                            risks and benefits, the patient was deemed in                            satisfactory condition to undergo the procedure.                           After obtaining informed consent, the endoscope was  passed under direct vision. Throughout the                            procedure, the patient's blood pressure, pulse, and                            oxygen saturations were monitored continuously. The                            Model GIF-HQ190 [id number]) scope was introduced                            through the mouth, and advanced to the second part                            of duodenum. The upper GI endoscopy was                     accomplished without difficulty. The patient                            tolerated the procedure well. Scope In: Scope Out: Findings:                 The esophagus was normal.                           The stomach was normal. No evidence of GAVE                           The examined duodenum was normal. Complications:            No immediate complications. Estimated Blood Loss:     Estimated blood loss: none. Impression:               - Normal esophagus.                           - Normal stomach.                           - Normal examined duodenum.                           - No specimens collected. Recommendation:           - Patient has a contact number available for                            emergencies. The signs and symptoms of potential                            delayed complications were discussed with the                            patient. Return to normal activities tomorrow.  Written discharge instructions were provided to the                            patient.                           - Resume previous diet.                           - Continue present medications.                           - Resume Xarelto (rivaroxaban) at prior dose                            tomorrow. Refer to managing physician for further                            adjustment of therapy.                           - Return to my office PRN. Mauri Pole, MD 07/05/2015 1:42:43 PM This report has been signed electronically.

## 2015-07-05 NOTE — Progress Notes (Signed)
Pt arrived in recovery in a-fib, report given from CRNA pt had been in a-fib during procedure as well but pt has hx of a fib, looked back at pts hx and looks like her last ECG she was in NS taking aspirin and cardizem, gave pt print out of EGK strip in recovery, she is asymptomatic at this time, but reports recent dizziness, advised to follow-up with cardiac-adm

## 2015-07-06 ENCOUNTER — Telehealth: Payer: Self-pay | Admitting: *Deleted

## 2015-07-06 LAB — HM DIABETES EYE EXAM

## 2015-07-06 NOTE — Telephone Encounter (Signed)
  Follow up Call-  Call back number 07/05/2015  Post procedure Call Back phone  # [phone]  Permission to leave phone message No     Patient questions:  Do you have a fever, pain , or abdominal swelling? No. Pain Score  0 *  Have you tolerated food without any problems? Yes.    Have you been able to return to your normal activities? Yes.    Do you have any questions about your discharge instructions: Diet   No. Medications  No. Follow up visit  No.  Do you have questions or concerns about your Care? No.  Actions: * If pain score is 4 or above: No action needed, pain <4.  Pt. States that she will see Cardiolgist 5/11, primary care 5/24, and will await app to be scheduled for f/u with Dr. Silverio Decamp.

## 2015-07-07 ENCOUNTER — Ambulatory Visit (INDEPENDENT_AMBULATORY_CARE_PROVIDER_SITE_OTHER): Payer: Medicare Other | Admitting: Physician Assistant

## 2015-07-07 ENCOUNTER — Encounter: Payer: Self-pay | Admitting: Physician Assistant

## 2015-07-07 VITALS — BP 140/90 | HR 73 | Ht 64.5 in | Wt 235.0 lb

## 2015-07-07 DIAGNOSIS — Z7901 Long term (current) use of anticoagulants: Secondary | ICD-10-CM | POA: Diagnosis not present

## 2015-07-07 DIAGNOSIS — I480 Paroxysmal atrial fibrillation: Secondary | ICD-10-CM

## 2015-07-07 DIAGNOSIS — I10 Essential (primary) hypertension: Secondary | ICD-10-CM

## 2015-07-07 HISTORY — DX: Long term (current) use of anticoagulants: Z79.01

## 2015-07-07 MED ORDER — RIVAROXABAN 20 MG PO TABS: 20.0000 mg | ORAL_TABLET | Freq: Every day | ORAL | Status: DC

## 2015-07-07 NOTE — Patient Instructions (Addendum)
Medication Instructions:  START Xarelto 20 mg daily and STOP Aspirin  Labwork: NONE  Testing/Procedures: NONE  Follow-Up: Your physician wants you to follow-up first available with Dr Johnsie Cancel. You will receive a reminder letter in the mail two months in advance. If you don't receive a letter, please call our office to schedule the follow-up appointment.   Any Other Special Instructions Will Be Listed Below (If Applicable).   If you need a refill on your cardiac medications before your next appointment, please call your pharmacy.

## 2015-07-07 NOTE — Progress Notes (Signed)
Cardiology Office Note   Date:  07/07/2015   ID:  Courtney Pena, DOB [birth year], MRN [id number]  PCP:  Zigmund Gottron, MD  Cardiologist:  Dr Normajean Baxter, PA-C   Chief Complaint  Patient presents with  . Follow-up    In A-fib during procedure yesterday.     History of Present Illness: Courtney Pena is a 75 y.o. 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 However, pt was in afib, rate OK CHADS2VASC=5  Courtney Pena presents for Follow-up and evaluation of her atrial fibrillation.  Pt was not really aware she was in atrial fibrillation. The only awareness she had that her heart was irregular was when she was going fast.   She occasionally gets palpitations, once every week or 2. They're brief. She takes Inderal as needed. They do not give her presyncope, shortness of breath or chest pain. She checks her blood pressure and heart rate on a regular basis at home. Only once in the last month or 2, has her heart rate then elevated greater than 70. She does not recall ever seeing the machine indicating that her heartbeat was irregular.  The EGD was done for epigastric discomfort and dysphagia. However, the patient realizes that she was not having abdominal discomfort as much as she was having epigastric palpitations. She has not had any of those since the procedure.  She has not had any exertional chest pain. She has some chronic dyspnea on exertion, but that is at baseline and has not changed recently. She is not having lower extremity edema or calf pain. She is having symptoms of vertigo, with dizziness whenever she moves her head quickly or changes position. This is not associated with any additional symptoms.   Past Medical History  Diagnosis Date  . Allergy   . Arthritis   . Asthma   . GERD  (gastroesophageal reflux disease)   . Hypertension   . Hyperlipidemia   . Neuromuscular disorder ([place])   . Hypothyroidism   . Ulcer   . Gastritis   . Hiatal hernia   . Obesity   . Personal history of colonic polyps 06/06/2009    cecal polyp  . Atrial flutter ([place]) 12-07-2010    converted in ED with 300 mg flecainide  . Anemia due to GI blood loss 08/12/2011  . Gastric antral vascular ectasia     source for gi bleed in 07/2011 - Xarelto stopped  . CAD (coronary artery disease)     a. mild per cath in 2004;  b. nonischemic Myoview in March 2012;  c. Lex MV 1/14:  EF 66%, no ischemia  . External hemorrhoids 06/07/2010  . Sleep apnea     does not use CPAP  . 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 Cataract extraction Bilateral   . Carpal tunnel release Bilateral 2003  . Cardiac catheterization  1999&2004  . Esophagogastroduodenoscopy  08/13/2011    Procedure: ESOPHAGOGASTRODUODENOSCOPY (EGD);  Surgeon: Gatha Mayer, MD;  Location: [facility];  Service: Endoscopy;  Laterality: N/A;  . Breast biopsy Left   . Colonoscopy    . Polypectomy      Current Outpatient Prescriptions  Medication Sig Dispense Refill  . Cholecalciferol (VITAMIN D3) 1000  UNITS CAPS Take 1 capsule by mouth daily.     . clobetasol cream (TEMOVATE) [id number] % Apply 1 application topically as directed.     . Colchicine 0.6 MG CAPS Take 1 tablet by mouth daily as needed (gout/pain).    Marland Kitchen dicyclomine (BENTYL) 10 MG capsule Take 1 capsule (10 mg total) by mouth 4 (four) times daily -  before meals and at bedtime. 90 capsule 2  . diltiazem (CARDIZEM CD) 360 MG 24 hr capsule Take 1 capsule (360 mg total) by mouth daily. 30 capsule 3  . glipiZIDE (GLUCOTROL XL) 10 MG 24 hr tablet Take 1 tablet by mouth  daily 90 tablet 3  . iron polysaccharides (FERREX 150) 150 MG capsule Take 1 capsule (150 mg total) by mouth 2 (two) times  daily. 180 capsule 3  . levothyroxine (SYNTHROID, LEVOTHROID) 112 MCG tablet Take 1 tablet (112 mcg total) by mouth daily. 90 tablet 3  . Liraglutide (VICTOZA) 18 MG/3ML SOPN Inject 0.54ml (1.2mg )  subcutaneously daily 18 mL 3  . meclizine (ANTIVERT) 25 MG tablet TAKE 1 TABLET (25 MG TOTAL) BY MOUTH 3 (THREE) TIMES DAILY AS NEEDED FOR DIZZINESS. 30 tablet 6  . metFORMIN (GLUCOPHAGE) 1000 MG tablet Take 1 tablet by mouth  twice a day with meals 180 tablet 3  . metoprolol succinate (TOPROL-XL) 100 MG 24 hr tablet Take 1 tablet by mouth  daily with or immediately  following a meal 90 tablet 3  . nitroGLYCERIN (NITROSTAT) 0.4 MG SL tablet Place 1 tablet (0.4 mg total) under the tongue every 5 (five) minutes as needed for chest pain. 25 tablet 4  . ONE TOUCH ULTRA TEST test strip Test 3 times daily 150 each 6  . pantoprazole (PROTONIX) 40 MG tablet Take 1 tablet (40 mg total) by mouth daily. 30 tablet 3  . pravastatin (PRAVACHOL) 10 MG tablet Take 10 mg by mouth every other day.    . propranolol (INDERAL) 10 MG tablet Take 1 tablet (10 mg total) by mouth daily. 90 tablet 3  . traMADol (ULTRAM) 50 MG tablet Take 50 mg by mouth every 6 (six) hours as needed for moderate pain or severe pain.    . rivaroxaban (XARELTO) 20 MG TABS tablet Take 1 tablet (20 mg total) by mouth daily with supper. 30 tablet 9   No current facility-administered medications for this visit.    Allergies:   Lisinopril; Losartan potassium; Nsaids; Penicillins; Sulfamethoxazole; Actos; and Gabapentin    Social History:  The patient  reports that she quit smoking about 22 years ago. Her smoking use included Cigarettes. She has a 60 pack-year smoking history. She has never used smokeless tobacco. She reports that she does not drink alcohol or use illicit drugs.   Family History:  The patient's family history includes Colon polyps in her daughter; Diabetes in her daughter and maternal grandfather; Heart disease in her father;  Hypertension in her mother; Other in her mother; Thyroid disease in her daughter. There is no history of Colon cancer.    ROS:  Please see the history of present illness. All other systems are reviewed and negative.    PHYSICAL EXAM: VS:  BP 140/90 mmHg  Pulse 73  Ht 5' 4.5" (1.638 m)  Wt 235 lb (106.595 kg)  BMI 39.73 kg/m2 , BMI Body mass index is 39.73 kg/(m^2). GEN: Well nourished, well developed, female in no acute distress HEENT: normal for age  Neck: no JVD, no carotid bruit, no masses Cardiac: RRR; no murmur,  no rubs, or gallops Respiratory:  clear to auscultation bilaterally, normal work of breathing GI: soft, nontender, nondistended, + BS MS: no deformity or atrophy; no edema; distal pulses are 2+ in all 4 extremities  Skin: warm and dry, no rash Neuro:  Strength and sensation are intact Psych: euthymic mood, full affect   EKG:  EKG is ordered today. The ekg ordered today demonstrates sinus rhythm with borderline first-degree AV block, PR 210 ms  A telemetry strip given to the patient after the procedure shows sinus rhythm with sinus arrhythmia. Per the patient, this was taken after the procedure, not when she was in atrial fibrillation.   Recent Labs: 01/27/2015: ALT 19 03/31/2015: BUN 17; Creat 0.86; Potassium 4.6; Sodium 132* 05/12/2015: Hemoglobin 10.5*; Platelets 306; TSH 8.27*    Lipid Panel    Component Value Date/Time   CHOL 242* 01/27/2015 1238   TRIG 141 01/27/2015 1238   HDL 52 01/27/2015 1238   CHOLHDL 4.7 01/27/2015 1238   VLDL 28 01/27/2015 1238   LDLCALC 162* 01/27/2015 1238   LDLDIRECT 178* 05/12/2015 1229     Wt Readings from Last 3 Encounters:  07/07/15 235 lb (106.595 kg)  07/05/15 234 lb (106.142 kg)  06/23/15 231 lb 1.6 oz (104.826 kg)     Other studies Reviewed: Additional studies/ records that were reviewed today include: Office notes, hospital records and testing  ASSESSMENT AND PLAN:  1.  Paroxysmal atrial fibrillation: Based  on documentation from her gastroenterology procedure, she is having recurrence of atrial fibrillation. She gets brief palpitations on an occasional basis. She is already on high doses of diltiazem and metoprolol. She takes when necessary propranolol once or twice a month.  According to the patient, her resting heart rate is in the 60s and occasionally 70s. Because of this I will not increase her home doses of diltiazem or metoprolol. If further rate control is needed, we can try giving her an additional 25 mg of metoprolol to take but am not sure her blood pressure or baseline heart rate will tolerate an additional 50 mg.  Discuss with Dr. Johnsie Cancel if further evaluation with echocardiogram to determine atrial size or an event monitor to determine the atrial fib burden is indicated. Since we already have a diagnosis, symptoms are infrequent, and she has significant risk factors for recurrence of atrial fibrillation, feel that continuing her beta blocker and calcium channel blocker is adequate for now.  2. Anticoagulation: CHADS2VASC=5. She was previously on Xarelto, but it was discontinued for an upper GI bleed. 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 Because of her epigastric symptoms, she had an EGD 07/06/2015. It showed no significant abnormality, specifically no vascular ectasia.  The risks and benefits of anticoagulation were discussed with the patient. As the source of her GI bleed was successfully treated in 2013 and follow-up endoscopy showed no recurrence, I feel she is at acceptable risk for anticoagulation. Ms Pipkins agrees to restart Xarelto. Her dose will be 20 mg daily. She is to contact us if she gets any bleeding, other problems or concerns.   Current medicines are reviewed at length with the patient today.  The patient does not have concerns regarding medicines.  The following changes have been made:  no change  Labs/ tests ordered today include:   Orders Placed This  Encounter  Procedures  . EKG 12-Lead     Disposition:   FU with Dr Johnsie Cancel  Signed, Rosaria Ferries, PA-C  07/07/2015 1:09 PM  [place] Phone: [phone]; Fax: [phone]  This note was written with the assistance of speech recognition software. Please excuse any transcriptional errors.

## 2015-07-14 ENCOUNTER — Telehealth: Payer: Self-pay

## 2015-07-14 NOTE — Telephone Encounter (Signed)
Prior auth for Xarelto 20mg  obtained from [facility]. Good through 02/26/2016. [id number].

## 2015-07-19 ENCOUNTER — Telehealth: Payer: Self-pay | Admitting: *Deleted

## 2015-07-19 NOTE — Telephone Encounter (Signed)
Patient states her BP has been running high and she requests an appointment. MD's earliest available is on 6/14. Patient wants to know if MD would be able to work her in any earlier?

## 2015-07-20 ENCOUNTER — Encounter: Payer: Self-pay | Admitting: Cardiovascular Disease

## 2015-07-20 ENCOUNTER — Ambulatory Visit: Payer: Medicare Other | Admitting: Physician Assistant

## 2015-07-20 NOTE — Telephone Encounter (Signed)
When reviewing this note and checking to see about availability i saw that pt has appointment on 5/25. Courtney Pena, Courtney Pena D, [place]

## 2015-07-21 ENCOUNTER — Ambulatory Visit (INDEPENDENT_AMBULATORY_CARE_PROVIDER_SITE_OTHER): Payer: Medicare Other | Admitting: Family Medicine

## 2015-07-21 ENCOUNTER — Encounter: Payer: Self-pay | Admitting: Family Medicine

## 2015-07-21 DIAGNOSIS — I10 Essential (primary) hypertension: Secondary | ICD-10-CM | POA: Diagnosis not present

## 2015-07-21 MED ORDER — LOSARTAN POTASSIUM 50 MG PO TABS: 50.0000 mg | ORAL_TABLET | Freq: Every day | ORAL | Status: DC

## 2015-07-21 NOTE — Assessment & Plan Note (Signed)
Poor control.  After much discussion about risks and benefits and the concern about cross reactivity, we decided that the best course was to challenge with ARB.  She knows to stop immediately if any swelling.   Will recheck BMP in one week.  See me in three weeks to make sure BP is coming down.

## 2015-07-21 NOTE — Patient Instructions (Signed)
Stop the propranolol Start losartin - stop it immediately if you have any signs of swelling.  I want to see you again in three weeks. Come in late next week for a blood test to check your kidneys on this new medication.

## 2015-07-21 NOTE — Progress Notes (Signed)
   Subjective:    Patient ID: Courtney Pena, female    DOB: 07-15-1940, 75 y.o.   MRN: [id number]  HPI CC elevated BP readings at home. BP has been consistently elevated at home on diltiazem, metoprolol and for some reason, a very small dose of propranolol.  She has done great with a healthy, low salt diet and has been losing weight steadily.  Since the death of her husband 2 months ago, her stress level has dropped.  She has not started regular exercise. Reviewed previous meds.  Not on an ACE or ARB due to angioedema with ACE in 2007,  Never tried on ARB.  Previously worried about cross reactivity. Also not on a diuretic due to gout.    Review of Systems     Objective:   Physical Exam BP confirmed Affect good. Lungs clear Cardiac RRR without m or g Trace peripheral edema.        Assessment & Plan:

## 2015-07-23 ENCOUNTER — Telehealth: Payer: Self-pay | Admitting: Family Medicine

## 2015-07-23 NOTE — Telephone Encounter (Signed)
Pt. Called after hours line. She was started on Losartan 50mg  daily earlier this week due to persistently elevated BP's. She says that she has not had a problem until today. She took her BP this morning before taking her medicine and it was 100/65. Subsequent measurement several hours later was 120/65. She says she felt light headed with the low BP this am "like she was floating". She wisely did not take her medicine this morning, and called for advice.   - I recommended holding Losartan dose today and taking the metoprolol and Cardizem as she had been previously taking.  - She is to check her blood pressure twice in the next 24hours, and if it remains low, then to hold off on restarting the losartan until her office visit on Tuesday next week.  - If her blood pressure returns to its previously elevated state [id number] systolic, then she can take half of the 50mg  of Losartan and monitor for another 24 hours.  - She already had an appointment and planned follow up on Tuesday next week.  - Advised that if she feels light headed like she is going to pass out, then to find a stable chair and sit down and rest until she feels better again.  - Advised to call back with any questions.   Paula Compton, MD Family Medicine -PGY 2

## 2015-07-26 ENCOUNTER — Ambulatory Visit: Payer: Medicare Other | Admitting: *Deleted

## 2015-07-26 ENCOUNTER — Ambulatory Visit (INDEPENDENT_AMBULATORY_CARE_PROVIDER_SITE_OTHER): Payer: Medicare Other | Admitting: *Deleted

## 2015-07-26 ENCOUNTER — Encounter: Payer: Self-pay | Admitting: *Deleted

## 2015-07-26 VITALS — BP 139/77 | HR 70 | Temp 97.8°F | Ht 64.5 in | Wt 233.0 lb

## 2015-07-26 DIAGNOSIS — I10 Essential (primary) hypertension: Secondary | ICD-10-CM

## 2015-07-26 DIAGNOSIS — Z0000 Encounter for general adult medical examination without abnormal findings: Secondary | ICD-10-CM | POA: Diagnosis not present

## 2015-07-26 LAB — BASIC METABOLIC PANEL
BUN: 20 mg/dL (ref 7–25)
CO2: 26 mmol/L (ref 20–31)
Calcium: 9.4 mg/dL (ref 8.6–10.4)
Chloride: 102 mmol/L (ref 98–110)
Creat: 0.85 mg/dL (ref 0.60–0.93)
Glucose, Bld: 198 mg/dL — ABNORMAL HIGH (ref 65–99)
Potassium: 4.4 mmol/L (ref 3.5–5.3)
Sodium: 138 mmol/L (ref 135–146)

## 2015-07-26 NOTE — Progress Notes (Signed)
Subjective:   Courtney Pena is a 75 y.o. female who presents for an Initial Medicare Annual Wellness Visit.    Objective:    Today's Vitals   07/26/15 1018  BP: 139/77  Pulse: 70  Temp: 97.8 F (36.6 C)  TempSrc: Oral  Height: 5' 4.5" (1.638 m)  Weight: 233 lb (105.688 kg)  SpO2: 99%  PainSc: 5   PainLoc: Knee   Body mass index is 39.39 kg/(m^2).   Current Medications (verified) Outpatient Encounter Prescriptions as of 07/26/2015  Medication Sig  . Cholecalciferol (VITAMIN D3) 1000 UNITS CAPS Take 1 capsule by mouth daily.   . clobetasol cream (TEMOVATE) [id number] % Apply 1 application topically as directed.   . Colchicine 0.6 MG CAPS Take 1 tablet by mouth daily as needed (gout/pain).  Marland Kitchen diltiazem (CARDIZEM CD) 360 MG 24 hr capsule Take 1 capsule (360 mg total) by mouth daily.  Marland Kitchen glipiZIDE (GLUCOTROL XL) 10 MG 24 hr tablet Take 1 tablet by mouth  daily  . iron polysaccharides (FERREX 150) 150 MG capsule Take 1 capsule (150 mg total) by mouth 2 (two) times daily.  Marland Kitchen levothyroxine (SYNTHROID, LEVOTHROID) 112 MCG tablet Take 1 tablet (112 mcg total) by mouth daily.  . Liraglutide (VICTOZA) 18 MG/3ML SOPN Inject 0.62ml (1.2mg )  subcutaneously daily  . losartan (COZAAR) 50 MG tablet Take 1 tablet (50 mg total) by mouth daily.  . meclizine (ANTIVERT) 25 MG tablet TAKE 1 TABLET (25 MG TOTAL) BY MOUTH 3 (THREE) TIMES DAILY AS NEEDED FOR DIZZINESS.  . metFORMIN (GLUCOPHAGE) 1000 MG tablet Take 1 tablet by mouth  twice a day with meals  . metoprolol succinate (TOPROL-XL) 100 MG 24 hr tablet Take 1 tablet by mouth  daily with or immediately  following a meal  . nitroGLYCERIN (NITROSTAT) 0.4 MG SL tablet Place 1 tablet (0.4 mg total) under the tongue every 5 (five) minutes as needed for chest pain.  . ONE TOUCH ULTRA TEST test strip Test 3 times daily  . rivaroxaban (XARELTO) 20 MG TABS tablet Take 1 tablet (20 mg total) by mouth daily with supper.  . traMADol (ULTRAM) 50 MG  tablet Take 50 mg by mouth every 6 (six) hours as needed for moderate pain or severe pain.  Marland Kitchen dicyclomine (BENTYL) 10 MG capsule Take 1 capsule (10 mg total) by mouth 4 (four) times daily -  before meals and at bedtime. (Patient not taking: Reported on 07/26/2015)  . pantoprazole (PROTONIX) 40 MG tablet Take 1 tablet (40 mg total) by mouth daily. (Patient not taking: Reported on 07/26/2015)  . pravastatin (PRAVACHOL) 10 MG tablet Take 10 mg by mouth every other day. Reported on 07/26/2015   No facility-administered encounter medications on file as of 07/26/2015.    Allergies (verified) Lisinopril; Nsaids; Penicillins; Sulfamethoxazole; Actos; and Gabapentin   History: Past Medical History  Diagnosis Date  . Allergy   . Arthritis   . Asthma   . GERD (gastroesophageal reflux disease)   . Hypertension   . Hyperlipidemia   . Neuromuscular disorder ([place])   . Hypothyroidism   . Ulcer   . Gastritis   . Hiatal hernia   . Obesity   . Personal history of colonic polyps 06/06/2009    cecal polyp  . Atrial flutter ([place]) 12-07-2010    converted in ED with 300 mg flecainide  . Anemia due to GI blood loss 08/12/2011  . Gastric antral vascular ectasia     source for gi bleed in 07/2011 -  Xarelto stopped  . CAD (coronary artery disease)     a. mild per cath in 2004;  b. nonischemic Myoview in March 2012;  c. Lex MV 1/14:  EF 66%, no ischemia  . External hemorrhoids 06/07/2010  . Sleep apnea     does not use CPAP  . 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 Chronic anticoagulation - Xarelto started 07/07/2015, CHADS2CVASC=5 07/07/2015   Past Surgical History  Procedure Laterality Date  . Cataract extraction Bilateral   . Carpal tunnel release Bilateral 2003  . Cardiac catheterization  1999&2004  . Esophagogastroduodenoscopy  08/13/2011    Procedure: ESOPHAGOGASTRODUODENOSCOPY (EGD);  Surgeon: Gatha Mayer, MD;  Location: [facility] ENDOSCOPY;  Service:  Endoscopy;  Laterality: N/A;  . Breast biopsy Left   . Colonoscopy    . Polypectomy     Family History  Problem Relation Age of Onset  . Heart disease Father   . Diabetes Maternal Grandfather   . Diabetes Daughter     pre-diabeties  . Colon polyps Daughter   . Hypertension Mother   . Other Mother     brain tumor-benign  . Thyroid disease Daughter     x 2  . Colon cancer Neg Hx    Social History   Occupational History  . Management Unemployed   Social History Main Topics  . Smoking status: Former Smoker -- 2.00 packs/day for 30 years    Types: Cigarettes    Quit date: 02/26/1993  . Smokeless tobacco: Never Used  . Alcohol Use: No  . Drug Use: No  . Sexual Activity: Not Currently    Tobacco Counseling Counseling given: Yes   Activities of Daily Living In your present state of health, do you have any difficulty performing the following activities: 07/26/2015  Hearing? N  Vision? N  Difficulty concentrating or making decisions? N  Walking or climbing stairs? Y  Dressing or bathing? N  Doing errands, shopping? N   Home Safety:  My home has a working smoke alarm:  Yes, times 2           My home throw rugs have been fastened down to the floor or removed:  Yes I have non-slip mats in the bathtub and shower:  Yes         All my home's stairs have railings or bannisters: No stairs present in home.         My home's floors, stairs and hallways are free from clutter, wires and cords: Yes       Immunizations and Health Maintenance Immunization History  Administered Date(s) Administered  . Influenza Split 11/22/2010, 11/08/2011  . Influenza Whole 12/09/2006, 12/19/2007, 01/12/2009  . Influenza,inj,Quad PF,36+ Mos 11/12/2012, 12/18/2013, 01/27/2015  . Pneumococcal Conjugate-13 05/27/2013  . Pneumococcal Polysaccharide-23 08/27/1998, 12/09/2006  . Td 11/26/2000  . Tdap 11/22/2010  . Zoster 10/01/2007   Health Maintenance Due  Topic Date Due  . OPHTHALMOLOGY EXAM   04/28/2014  Patient reports eye exam May 2017 with Dr. Susa Simmonds  Patient Care Team: [name], MD as PCP - General Josue Hector, MD as Consulting Physician (Cardiology) Marilynne Halsted, MD as Referring Physician (Ophthalmology) Almedia Balls, MD as Consulting Physician (Orthopedic Surgery)  [place] as Podiatrist  Indicate any recent Medical Services you may have received from other than [facility] providers in the past year (date may be approximate).     Assessment:   This is a routine wellness examination  for [employer].   Hearing/Vision screen  Hearing Screening   125Hz  250Hz  500Hz  1000Hz  2000Hz  4000Hz  8000Hz   Right ear:   40 40 40 40   Left ear:   40 40 40 40     Dietary issues and exercise activities discussed: Current Exercise Habits: The patient does not participate in regular exercise at present However, she recently joined [employer] and would like to begin exercising 30 minutes per day 3-4 times daily to start. Discussed recording consumption intake to assist with desired 7% weight loss. Recommended My[employer] or My Fitness Pal apps to assist with recording.   Goals    . Blood Pressure < 140/90    . Exercise 150 minutes per week (moderate activity)     Wants to start walking 30 minutes 3-4 times per week. Recently joined [employer].    Marland Kitchen HEMOGLOBIN A1C < 8    . LDL CALC < 100    . Weight < 216 lb (97.977 kg)     7% weight loss      Patient has home BP monitoring device. Patient instructed on most accurate way to take BP, to keep BP log with date and time and how she is feeling and bring log to all future visits.  Depression Screen PHQ 2/9 Scores 07/26/2015 07/21/2015 06/23/2015 05/12/2015 01/27/2015 08/25/2014 06/24/2014  PHQ - 2 Score 0 0 1 0 0 0 0    Fall Risk Fall Risk  07/26/2015 01/27/2015 08/25/2014 04/15/2014 03/26/2014  Falls in the past year? Yes Yes No No No  Number falls in past yr: 1 1 - - -  Injury with Fall? Yes Yes - - -    Cognitive Function: Mini-Cog passed  with score of 5/5  TUG Test:  Passed in 11 seconds. Patient used both hands to push out of chair.  Screening Tests Health Maintenance  Topic Date Due  . OPHTHALMOLOGY EXAM  04/28/2014  . INFLUENZA VACCINE  09/27/2015  . HEMOGLOBIN A1C  12/23/2015  . FOOT EXAM  06/22/2016  . COLONOSCOPY  06/06/2020  . TETANUS/TDAP  11/21/2020  . DEXA SCAN  Completed  . ZOSTAVAX  Completed  . PNA vac Low Risk Adult  Completed      Plan:    During the course of the visit, Aulani was educated and counseled about the following appropriate screening and preventive services:   Vaccines to include Pneumoccal, Influenza,Td, Zostavax  Colorectal cancer screening  Bone density screening  Diabetes screening  Mammography/PAP  Nutrition counseling  Patient Instructions (the written plan) were given to the patient.    Velora Heckler, RN   07/26/2015

## 2015-07-26 NOTE — Progress Notes (Signed)
Patient ID: Courtney Pena, female   DOB: Mar 18, 1940, 75 y.o.   MRN: [id number] I have reviewed this visit and discussed with Howell Rucks, RN, BSN, and agree with her documentation.

## 2015-07-26 NOTE — Patient Instructions (Signed)
Fat and Cholesterol Restricted Diet High levels of fat and cholesterol in your blood may lead to various health problems, such as diseases of the heart, blood vessels, gallbladder, liver, and pancreas. Fats are concentrated sources of energy that come in various forms. Certain types of fat, including saturated fat, may be harmful in excess. Cholesterol is a substance needed by your body in small amounts. Your body makes all the cholesterol it needs. Excess cholesterol comes from the food you eat. When you have high levels of cholesterol and saturated fat in your blood, health problems can develop because the excess fat and cholesterol will gather along the walls of your blood vessels, causing them to narrow. Choosing the right foods will help you control your intake of fat and cholesterol. This will help keep the levels of these substances in your blood within normal limits and reduce your risk of disease. WHAT IS MY PLAN? Your health care provider recommends that you:  Get no more than __________ % of the total calories in your daily diet from fat.  Limit your intake of saturated fat to less than ______% of your total calories each day.  Limit the amount of cholesterol in your diet to less than _________mg per day. WHAT TYPES OF FAT SHOULD I CHOOSE?  Choose healthy fats more often. Choose monounsaturated and polyunsaturated fats, such as olive and canola oil, flaxseeds, walnuts, almonds, and seeds.  Eat more omega-3 fats. Good choices include salmon, mackerel, sardines, tuna, flaxseed oil, and ground flaxseeds. Aim to eat fish at least two times a week.  Limit saturated fats. Saturated fats are primarily found in animal products, such as meats, butter, and cream. Plant sources of saturated fats include palm oil, palm kernel oil, and coconut oil.  Avoid foods with partially hydrogenated oils in them. These contain trans fats. Examples of foods that contain trans fats are stick margarine, some  tub margarines, cookies, crackers, and other baked goods. WHAT GENERAL GUIDELINES DO I NEED TO FOLLOW? These guidelines for healthy eating will help you control your intake of fat and cholesterol:  Check food labels carefully to identify foods with trans fats or high amounts of saturated fat.  Fill one half of your plate with vegetables and green salads.  Fill one fourth of your plate with whole grains. Look for the word "whole" as the first word in the ingredient list.  Fill one fourth of your plate with lean protein foods.  Limit fruit to two servings a day. Choose fruit instead of juice.  Eat more foods that contain soluble fiber. Examples of foods that contain this type of fiber are apples, broccoli, carrots, beans, peas, and barley. Aim to get 20-30 g of fiber per day.  Eat more home-cooked food and less restaurant, buffet, and fast food.  Limit or avoid alcohol.  Limit foods high in starch and sugar.  Limit fried foods.  Cook foods using methods other than frying. Baking, boiling, grilling, and broiling are all great options.  Lose weight if you are overweight. Losing just 5-10% of your initial body weight can help your overall health and prevent diseases such as diabetes and heart disease. WHAT FOODS CAN I EAT? Grains Whole grains, such as whole wheat or whole grain breads, crackers, cereals, and pasta. Unsweetened oatmeal, bulgur, barley, quinoa, or brown rice. Corn or whole wheat flour tortillas. Vegetables Fresh or frozen vegetables (raw, steamed, roasted, or grilled). Green salads. Fruits All fresh, canned (in natural juice), or frozen fruits. Meat and  Other Protein Products Ground beef (85% or leaner), grass-fed beef, or beef trimmed of fat. Skinless chicken or [place]. Ground chicken or [place]. Pork trimmed of fat. All fish and seafood. Eggs. Dried beans, peas, or lentils. Unsalted nuts or seeds. Unsalted canned or dry beans. Dairy Low-fat dairy products, such as skim  or 1% milk, 2% or reduced-fat cheeses, low-fat ricotta or cottage cheese, or plain low-fat yogurt. Fats and Oils Tub margarines without trans fats. Light or reduced-fat mayonnaise and salad dressings. Avocado. Olive, canola, sesame, or safflower oils. Natural peanut or almond butter (choose ones without added sugar and oil). The items listed above may not be a complete list of recommended foods or beverages. Contact your dietitian for more options. WHAT FOODS ARE NOT RECOMMENDED? Grains White bread. White pasta. White rice. Cornbread. Bagels, pastries, and croissants. Crackers that contain trans fat. Vegetables White potatoes. Corn. Creamed or fried vegetables. Vegetables in a cheese sauce. Fruits Dried fruits. Canned fruit in light or heavy syrup. Fruit juice. Meat and Other Protein Products Fatty cuts of meat. Ribs, chicken wings, bacon, sausage, bologna, salami, chitterlings, fatback, hot dogs, bratwurst, and packaged luncheon meats. Liver and organ meats. Dairy Whole or 2% milk, cream, half-and-half, and cream cheese. Whole milk cheeses. Whole-fat or sweetened yogurt. Full-fat cheeses. Nondairy creamers and whipped toppings. Processed cheese, cheese spreads, or cheese curds. Sweets and Desserts Corn syrup, sugars, honey, and molasses. Candy. Jam and jelly. Syrup. Sweetened cereals. Cookies, pies, cakes, donuts, muffins, and ice cream. Fats and Oils Butter, stick margarine, lard, shortening, ghee, or bacon fat. Coconut, palm kernel, or palm oils. Beverages Alcohol. Sweetened drinks (such as sodas, lemonade, and fruit drinks or punches). The items listed above may not be a complete list of foods and beverages to avoid. Contact your dietitian for more information.   This information is not intended to replace advice given to you by your health care provider. Make sure you discuss any questions you have with your health care provider.   Document Released: 02/12/2005 Document Revised:  03/05/2014 Document Reviewed: 05/13/2013 Elsevier Interactive Patient Education 2016 [employer].  Diabetes and Foot Care Diabetes may cause you to have problems because of poor blood supply (circulation) to your feet and legs. This may cause the skin on your feet to become thinner, break easier, and heal more slowly. Your skin may become dry, and the skin may peel and crack. You may also have nerve damage in your legs and feet causing decreased feeling in them. You may not notice minor injuries to your feet that could lead to infections or more serious problems. Taking care of your feet is one of the most important things you can do for yourself.  HOME CARE INSTRUCTIONS  Wear shoes at all times, even in the house. Do not go barefoot. Bare feet are easily injured.  Check your feet daily for blisters, cuts, and redness. If you cannot see the bottom of your feet, use a mirror or ask someone for help.  Wash your feet with warm water (do not use hot water) and mild soap. Then pat your feet and the areas between your toes until they are completely dry. Do not soak your feet as this can dry your skin.  Apply a moisturizing lotion or petroleum jelly (that does not contain alcohol and is unscented) to the skin on your feet and to dry, brittle toenails. Do not apply lotion between your toes.  Trim your toenails straight across. Do not dig under them or around  the cuticle. File the edges of your nails with an emery board or nail file.  Do not cut corns or calluses or try to remove them with medicine.  Wear clean socks or stockings every day. Make sure they are not too tight. Do not wear knee-high stockings since they may decrease blood flow to your legs.  Wear shoes that fit properly and have enough cushioning. To break in new shoes, wear them for just a few hours a day. This prevents you from injuring your feet. Always look in your shoes before you put them on to be sure there are no objects  inside.  Do not cross your legs. This may decrease the blood flow to your feet.  If you find a minor scrape, cut, or break in the skin on your feet, keep it and the skin around it clean and dry. These areas may be cleansed with mild soap and water. Do not cleanse the area with peroxide, alcohol, or iodine.  When you remove an adhesive bandage, be sure not to damage the skin around it.  If you have a wound, look at it several times a day to make sure it is healing.  Do not use heating pads or hot water bottles. They may burn your skin. If you have lost feeling in your feet or legs, you may not know it is happening until it is too late.  Make sure your health care provider performs a complete foot exam at least annually or more often if you have foot problems. Report any cuts, sores, or bruises to your health care provider immediately. SEEK MEDICAL CARE IF:   You have an injury that is not healing.  You have cuts or breaks in the skin.  You have an ingrown nail.  You notice redness on your legs or feet.  You feel burning or tingling in your legs or feet.  You have pain or cramps in your legs and feet.  Your legs or feet are numb.  Your feet always feel cold. SEEK IMMEDIATE MEDICAL CARE IF:   There is increasing redness, swelling, or pain in or around a wound.  There is a red line that goes up your leg.  Pus is coming from a wound.  You develop a fever or as directed by your health care provider.  You notice a bad smell coming from an ulcer or wound.   This information is not intended to replace advice given to you by your health care provider. Make sure you discuss any questions you have with your health care provider.   Document Released: 02/10/2000 Document Revised: 10/15/2012 Document Reviewed: 07/22/2012 Elsevier Interactive Patient Education 2016 [employer]   Fall Prevention in the Home  Falls can cause injuries. They can happen to people of all ages. There are  many things you can do to make your home safe and to help prevent falls.  WHAT CAN I DO ON THE OUTSIDE OF MY HOME?  Regularly fix the edges of walkways and driveways and fix any cracks.  Remove anything that might make you trip as you walk through a door, such as a raised step or threshold.  Trim any bushes or trees on the path to your home.  Use bright outdoor lighting.  Clear any walking paths of anything that might make someone trip, such as rocks or tools.  Regularly check to see if handrails are loose or broken. Make sure that both sides of any steps have handrails.  Any   raised decks and porches should have guardrails on the edges.  Have any leaves, snow, or ice cleared regularly.  Use sand or salt on walking paths during winter.  Clean up any spills in your garage right away. This includes oil or grease spills. WHAT CAN I DO IN THE BATHROOM?   Use night lights.  Install grab bars by the toilet and in the tub and shower. Do not use towel bars as grab bars.  Use non-skid mats or decals in the tub or shower.  If you need to sit down in the shower, use a plastic, non-slip stool.  Keep the floor dry. Clean up any water that spills on the floor as soon as it happens.  Remove soap buildup in the tub or shower regularly.  Attach bath mats securely with double-sided non-slip rug tape.  Do not have throw rugs and other things on the floor that can make you trip. WHAT CAN I DO IN THE BEDROOM?  Use night lights.  Make sure that you have a light by your bed that is easy to reach.  Do not use any sheets or blankets that are too big for your bed. They should not hang down onto the floor.  Have a firm chair that has side arms. You can use this for support while you get dressed.  Do not have throw rugs and other things on the floor that can make you trip. WHAT CAN I DO IN THE KITCHEN?  Clean up any spills right away.  Avoid walking on wet floors.  Keep items that you use  a lot in easy-to-reach places.  If you need to reach something above you, use a strong step stool that has a grab bar.  Keep electrical cords out of the way.  Do not use floor polish or wax that makes floors slippery. If you must use wax, use non-skid floor wax.  Do not have throw rugs and other things on the floor that can make you trip. WHAT CAN I DO WITH MY STAIRS?  Do not leave any items on the stairs.  Make sure that there are handrails on both sides of the stairs and use them. Fix handrails that are broken or loose. Make sure that handrails are as long as the stairways.  Check any carpeting to make sure that it is firmly attached to the stairs. Fix any carpet that is loose or worn.  Avoid having throw rugs at the top or bottom of the stairs. If you do have throw rugs, attach them to the floor with carpet tape.  Make sure that you have a light switch at the top of the stairs and the bottom of the stairs. If you do not have them, ask someone to add them for you. WHAT ELSE CAN I DO TO HELP PREVENT FALLS?  Wear shoes that:  Do not have high heels.  Have rubber bottoms.  Are comfortable and fit you well.  Are closed at the toe. Do not wear sandals.  If you use a stepladder:  Make sure that it is fully opened. Do not climb a closed stepladder.  Make sure that both sides of the stepladder are locked into place.  Ask someone to hold it for you, if possible.  Clearly mark and make sure that you can see:  Any grab bars or handrails.  First and last steps.  Where the edge of each step is.  Use tools that help you move around (mobility aids) if they   are needed. These include:  Canes.  Walkers.  Scooters.  Crutches.  Turn on the lights when you go into a dark area. Replace any light bulbs as soon as they burn out.  Set up your furniture so you have a clear path. Avoid moving your furniture around.  If any of your floors are uneven, fix them.  If there are any  pets around you, be aware of where they are.  Review your medicines with your doctor. Some medicines can make you feel dizzy. This can increase your chance of falling. Ask your doctor what other things that you can do to help prevent falls.   This information is not intended to replace advice given to you by your health care provider. Make sure you discuss any questions you have with your health care provider.   Document Released: 12/09/2008 Document Revised: 06/29/2014 Document Reviewed: 03/19/2014 Elsevier Interactive Patient Education 2016 [employer]  Health Maintenance, Female Adopting a healthy lifestyle and getting preventive care can go a long way to promote health and wellness. Talk with your health care provider about what schedule of regular examinations is right for you. This is a good chance for you to check in with your provider about disease prevention and staying healthy. In between checkups, there are plenty of things you can do on your own. Experts have done a lot of research about which lifestyle changes and preventive measures are most likely to keep you healthy. Ask your health care provider for more information. WEIGHT AND DIET  Eat a healthy diet  Be sure to include plenty of vegetables, fruits, low-fat dairy products, and lean protein.  Do not eat a lot of foods high in solid fats, added sugars, or salt.  Get regular exercise. This is one of the most important things you can do for your health.  Most adults should exercise for at least 150 minutes each week. The exercise should increase your heart rate and make you sweat (moderate-intensity exercise).  Most adults should also do strengthening exercises at least twice a week. This is in addition to the moderate-intensity exercise.  Maintain a healthy weight  Body mass index (BMI) is a measurement that can be used to identify possible weight problems. It estimates body fat based on height and weight. Your health  care provider can help determine your BMI and help you achieve or maintain a healthy weight.  For females 20 years of age and older:   A BMI below 18.5 is considered underweight.  A BMI of 18.5 to 24.9 is normal.  A BMI of 25 to 29.9 is considered overweight.  A BMI of 30 and above is considered obese.  Watch levels of cholesterol and blood lipids  You should start having your blood tested for lipids and cholesterol at 75 years of age, then have this test every 5 years.  You may need to have your cholesterol levels checked more often if:  Your lipid or cholesterol levels are high.  You are older than 75 years of age.  You are at high risk for heart disease.  CANCER SCREENING   Lung Cancer  Lung cancer screening is recommended for adults 55-80 years old who are at high risk for lung cancer because of a history of smoking.  A yearly low-dose CT scan of the lungs is recommended for people who:  Currently smoke.  Have quit within the past 15 years.  Have at least a 30-pack-year history of smoking. A pack year is   smoking an average of one pack of cigarettes a day for 1 year.  Yearly screening should continue until it has been 15 years since you quit.  Yearly screening should stop if you develop a health problem that would prevent you from having lung cancer treatment.  Breast Cancer  Practice breast self-awareness. This means understanding how your breasts normally appear and feel.  It also means doing regular breast self-exams. Let your health care provider know about any changes, no matter how small.  If you are in your 20s or 30s, you should have a clinical breast exam (CBE) by a health care provider every 1-3 years as part of a regular health exam.  If you are 40 or older, have a CBE every year. Also consider having a breast X-ray (mammogram) every year.  If you have a family history of breast cancer, talk to your health care provider about genetic  screening.  If you are at high risk for breast cancer, talk to your health care provider about having an MRI and a mammogram every year.  Breast cancer gene (BRCA) assessment is recommended for women who have family members with BRCA-related cancers. BRCA-related cancers include:  Breast.  Ovarian.  Tubal.  Peritoneal cancers.  Results of the assessment will determine the need for genetic counseling and BRCA1 and BRCA2 testing. Cervical Cancer Your health care provider may recommend that you be screened regularly for cancer of the pelvic organs (ovaries, uterus, and vagina). This screening involves a pelvic examination, including checking for microscopic changes to the surface of your cervix (Pap test). You may be encouraged to have this screening done every 3 years, beginning at age 21.  For women ages 30-65, health care providers may recommend pelvic exams and Pap testing every 3 years, or they may recommend the Pap and pelvic exam, combined with testing for human papilloma virus (HPV), every 5 years. Some types of HPV increase your risk of cervical cancer. Testing for HPV may also be done on women of any age with unclear Pap test results.  Other health care providers may not recommend any screening for nonpregnant women who are considered low risk for pelvic cancer and who do not have symptoms. Ask your health care provider if a screening pelvic exam is right for you.  If you have had past treatment for cervical cancer or a condition that could lead to cancer, you need Pap tests and screening for cancer for at least 20 years after your treatment. If Pap tests have been discontinued, your risk factors (such as having a new sexual partner) need to be reassessed to determine if screening should resume. Some women have medical problems that increase the chance of getting cervical cancer. In these cases, your health care provider may recommend more frequent screening and Pap tests. Colorectal  Cancer  This type of cancer can be detected and often prevented.  Routine colorectal cancer screening usually begins at 75 years of age and continues through 75 years of age.  Your health care provider may recommend screening at an earlier age if you have risk factors for colon cancer.  Your health care provider may also recommend using home test kits to check for hidden blood in the stool.  A small camera at the end of a tube can be used to examine your colon directly (sigmoidoscopy or colonoscopy). This is done to check for the earliest forms of colorectal cancer.  Routine screening usually begins at age 50.  Direct examination of the   colon should be repeated every 5-10 years through 75 years of age. However, you may need to be screened more often if early forms of precancerous polyps or small growths are found. Skin Cancer  Check your skin from head to toe regularly.  Tell your health care provider about any new moles or changes in moles, especially if there is a change in a mole's shape or color.  Also tell your health care provider if you have a mole that is larger than the size of a pencil eraser.  Always use sunscreen. Apply sunscreen liberally and repeatedly throughout the day.  Protect yourself by wearing long sleeves, pants, a wide-brimmed hat, and sunglasses whenever you are outside. HEART DISEASE, DIABETES, AND HIGH BLOOD PRESSURE   High blood pressure causes heart disease and increases the risk of stroke. High blood pressure is more likely to develop in:  People who have blood pressure in the high end of the normal range (130-139/85-89 mm Hg).  People who are overweight or obese.  People who are African American.  If you are 18-39 years of age, have your blood pressure checked every 3-5 years. If you are 40 years of age or older, have your blood pressure checked every year. You should have your blood pressure measured twice--once when you are at a hospital or clinic,  and once when you are not at a hospital or clinic. Record the average of the two measurements. To check your blood pressure when you are not at a hospital or clinic, you can use:  An automated blood pressure machine at a pharmacy.  A home blood pressure monitor.  If you are between 55 years and 79 years old, ask your health care provider if you should take aspirin to prevent strokes.  Have regular diabetes screenings. This involves taking a blood sample to check your fasting blood sugar level.  If you are at a normal weight and have a low risk for diabetes, have this test once every three years after 75 years of age.  If you are overweight and have a high risk for diabetes, consider being tested at a younger age or more often. PREVENTING INFECTION  Hepatitis B  If you have a higher risk for hepatitis B, you should be screened for this virus. You are considered at high risk for hepatitis B if:  You were born in a country where hepatitis B is common. Ask your health care provider which countries are considered high risk.  Your parents were born in a high-risk country, and you have not been immunized against hepatitis B (hepatitis B vaccine).  You have HIV or AIDS.  You use needles to inject street drugs.  You live with someone who has hepatitis B.  You have had sex with someone who has hepatitis B.  You get hemodialysis treatment.  You take certain medicines for conditions, including cancer, organ transplantation, and autoimmune conditions. Hepatitis C  Blood testing is recommended for:  Everyone born from 1945 through 1965.  Anyone with known risk factors for hepatitis C. Sexually transmitted infections (STIs)  You should be screened for sexually transmitted infections (STIs) including gonorrhea and chlamydia if:  You are sexually active and are younger than 75 years of age.  You are older than 75 years of age and your health care provider tells you that you are at risk  for this type of infection.  Your sexual activity has changed since you were last screened and you are at an increased risk   for chlamydia or gonorrhea. Ask your health care provider if you are at risk.  If you do not have HIV, but are at risk, it may be recommended that you take a prescription medicine daily to prevent HIV infection. This is called pre-exposure prophylaxis (PrEP). You are considered at risk if:  You are sexually active and do not regularly use condoms or know the HIV status of your partner(s).  You take drugs by injection.  You are sexually active with a partner who has HIV. Talk with your health care provider about whether you are at high risk of being infected with HIV. If you choose to begin PrEP, you should first be tested for HIV. You should then be tested every 3 months for as long as you are taking PrEP.  PREGNANCY   If you are premenopausal and you may become pregnant, ask your health care provider about preconception counseling.  If you may become pregnant, take 400 to 800 micrograms (mcg) of folic acid every day.  If you want to prevent pregnancy, talk to your health care provider about birth control (contraception). OSTEOPOROSIS AND MENOPAUSE   Osteoporosis is a disease in which the bones lose minerals and strength with aging. This can result in serious bone fractures. Your risk for osteoporosis can be identified using a bone density scan.  If you are 65 years of age or older, or if you are at risk for osteoporosis and fractures, ask your health care provider if you should be screened.  Ask your health care provider whether you should take a calcium or vitamin D supplement to lower your risk for osteoporosis.  Menopause may have certain physical symptoms and risks.  Hormone replacement therapy may reduce some of these symptoms and risks. Talk to your health care provider about whether hormone replacement therapy is right for you.  HOME CARE INSTRUCTIONS    Schedule regular health, dental, and eye exams.  Stay current with your immunizations.   Do not use any tobacco products including cigarettes, chewing tobacco, or electronic cigarettes.  If you are pregnant, do not drink alcohol.  If you are breastfeeding, limit how much and how often you drink alcohol.  Limit alcohol intake to no more than 1 drink per day for nonpregnant women. One drink equals 12 ounces of beer, 5 ounces of wine, or 1 ounces of hard liquor.  Do not use street drugs.  Do not share needles.  Ask your health care provider for help if you need support or information about quitting drugs.  Tell your health care provider if you often feel depressed.  Tell your health care provider if you have ever been abused or do not feel safe at home.   This information is not intended to replace advice given to you by your health care provider. Make sure you discuss any questions you have with your health care provider.   Document Released: 08/28/2010 Document Revised: 03/05/2014 Document Reviewed: 01/14/2013 Elsevier Interactive Patient Education 2016 Elsevier Inc.  

## 2015-07-29 ENCOUNTER — Telehealth: Payer: Self-pay | Admitting: *Deleted

## 2015-07-29 NOTE — Telephone Encounter (Signed)
Prior auth approved for Xarelto from 07/14/15 to 02/26/16.

## 2015-07-29 NOTE — Telephone Encounter (Signed)
Attempted to fill out prior auth for Xarelto 20mg  on [email or web address]. Was unsuccessful after several attempts. Called CoverMyMeds and they had form faxed to office to fill out. Form received, completed and faxed to [place]. Waiting for reply.

## 2015-08-04 NOTE — Progress Notes (Signed)
Patient ID: Courtney Pena, female   DOB: 1940/06/05, 75 y.o.   MRN: [id number]     Cardiology Office Note   Date:  08/05/2015   ID:  [name], DOB 11/13/1940, MRN [id number]  PCP:  Zigmund Gottron, MD  Cardiologist:  Dr Oswaldo Conroy, MD   Chief Complaint  Patient presents with  . Hypertension    felt some afib this morning, per pt    History of Present Illness: Courtney Pena is a 75 y.o. 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 However, pt was in afib, rate OK CHADS2VASC=5  Courtney Pena presents for Follow-up and evaluation of her atrial fibrillation.  Recent endoscopy and telemetry showed PAF 07/05/15    Pt was not really aware she was in atrial fibrillation. The only awareness she had that her heart was irregular was when she was going fast.   She occasionally gets palpitations, once every week or 2. They're brief. She takes Inderal as needed. They do not give her presyncope, shortness of breath or chest pain. She checks her blood pressure and heart rate on a regular basis at home. Only once in the last month or 2, has her heart rate then elevated greater than 70. She does not recall ever seeing the machine indicating that her heartbeat was irregular.  The EGD was done for epigastric discomfort and dysphagia. However, the patient realizes that she was not having abdominal discomfort as much as she was having epigastric palpitations. She has not had any of those since the procedure.  Still having palpitations which likely represent ongoing PAF Discussed starting AAT and she agrees  Husband died in 16-May-2022 and she is having a bit of a hard time refocusing her life    Past Medical History  Diagnosis Date  . Allergy   . Arthritis   . Asthma   . GERD (gastroesophageal reflux disease)    . Hypertension   . Hyperlipidemia   . Neuromuscular disorder ([place])   . Hypothyroidism   . Ulcer   . Gastritis   . Hiatal hernia   . Obesity   . Personal history of colonic polyps 06/06/2009    cecal polyp  . Atrial flutter ([place]) 12-07-2010    converted in ED with 300 mg flecainide  . Anemia due to GI blood loss 08/12/2011  . Gastric antral vascular ectasia     source for gi bleed in 07/2011 - Xarelto stopped  . CAD (coronary artery disease)     a. mild per cath in 2004;  b. nonischemic Myoview in 05-16-10;  c. Lex MV 1/14:  EF 66%, no ischemia  . External hemorrhoids 06/07/2010  . Sleep apnea     does not use CPAP  . Type 2 diabetes mellitus with diabetic chronic kidney disease ([place]) 04/25/2006       . Chronic anticoagulation - Xarelto started 07/07/2015, CHADS2CVASC=5 07/07/2015    Past Surgical History  Procedure Laterality Date  . Cataract extraction Bilateral   . Carpal tunnel release Bilateral 2003  . Cardiac catheterization  1999&2004  . Esophagogastroduodenoscopy  08/13/2011    Procedure: ESOPHAGOGASTRODUODENOSCOPY (EGD);  Surgeon: Gatha Mayer, MD;  Location: [facility] ENDOSCOPY;  Service: Endoscopy;  Laterality: N/A;  . Breast biopsy Left   . Colonoscopy    . Polypectomy      Current Outpatient Prescriptions  Medication Sig Dispense Refill  . Cholecalciferol (VITAMIN D3) 1000 UNITS CAPS Take 1 capsule by mouth daily.     . clobetasol cream (TEMOVATE) AB-123456789 % Apply 1 application topically as directed.     . Colchicine 0.6 MG CAPS Take 1 tablet by mouth daily as needed (gout/pain).    Marland Kitchen diltiazem (CARDIZEM CD) 360 MG 24 hr capsule Take 1 capsule (360 mg total) by mouth daily. 30 capsule 3  . glipiZIDE (GLUCOTROL XL) 10 MG 24 hr tablet Take 1 tablet by mouth  daily 90 tablet 3  . iron polysaccharides (FERREX 150) 150 MG capsule Take 1 capsule (150 mg total) by mouth 2 (two) times daily. 180 capsule 3  . levothyroxine (SYNTHROID, LEVOTHROID) 112 MCG tablet Take 1 tablet  (112 mcg total) by mouth daily. 90 tablet 3  . Liraglutide (VICTOZA) 18 MG/3ML SOPN Inject 0.12ml (1.2mg )  subcutaneously daily 18 mL 3  . losartan (COZAAR) 50 MG tablet Take 1 tablet (50 mg total) by mouth daily. 30 tablet 3  . meclizine (ANTIVERT) 25 MG tablet TAKE 1 TABLET (25 MG TOTAL) BY MOUTH 3 (THREE) TIMES DAILY AS NEEDED FOR DIZZINESS. 30 tablet 6  . metFORMIN (GLUCOPHAGE) 1000 MG tablet Take 1 tablet by mouth  twice a day with meals 180 tablet 3  . metoprolol succinate (TOPROL-XL) 100 MG 24 hr tablet Take 1 tablet by mouth  daily with or immediately  following a meal 90 tablet 3  . nitroGLYCERIN (NITROSTAT) 0.4 MG SL tablet Place 1 tablet (0.4 mg total) under the tongue every 5 (five) minutes as needed for chest pain. 25 tablet 4  . pantoprazole (PROTONIX) 40 MG tablet Take 1 tablet (40 mg total) by mouth daily. 30 tablet 3  . pravastatin (PRAVACHOL) 10 MG tablet Take 10 mg by mouth every other day. Reported on 07/26/2015    . rivaroxaban (XARELTO) 20 MG TABS tablet Take 1 tablet (20 mg total) by mouth daily with supper. 30 tablet 9  . traMADol (ULTRAM) 50 MG tablet Take 50 mg by mouth every 6 (six) hours as needed for moderate pain or severe pain.     No current facility-administered medications for this visit.    Allergies:   Lisinopril; Nsaids; Penicillins; Sulfamethoxazole; Actos; and Gabapentin    Social History:  The patient  reports that she quit smoking about 22 years ago. Her smoking use included Cigarettes. She has a 60 pack-year smoking history. She has never used smokeless tobacco. She reports that she does not drink alcohol or use illicit drugs.   Family History:  The patient's family history includes Colon polyps in her daughter; Diabetes in her daughter and maternal grandfather; Heart disease in her father; Hypertension in her mother; Other in her mother; Thyroid disease in her daughter. There is no history of Colon cancer.    ROS:  Please see the history of present  illness. All other systems are reviewed and negative.    PHYSICAL EXAM: VS:  BP 133/78 mmHg  Pulse 82  Ht 5\' 4"  (1.626 m)  Wt 105.416 kg (232 lb 6.4 oz)  BMI 39.87 kg/m2  SpO2 93% , BMI Body mass index is 39.87 kg/(m^2). GEN: Well nourished, well developed, female in no acute distress HEENT: normal for age  Neck: no JVD, no carotid bruit, no masses Cardiac: RRR; no murmur, no rubs, or gallops Respiratory:  clear to auscultation bilaterally, normal work of breathing GI: soft, nontender, nondistended, + BS MS: no deformity or atrophy; no edema; distal  pulses are 2+ in all 4 extremities  Skin: warm and dry, no rash Neuro:  Strength and sensation are intact Psych: euthymic mood, full affect   EKG:   07/07/15  sinus rhythm with borderline first-degree AV block, PR 210 ms  08/05/15  SR rate 82  Low voltage PR 208 msec poor R wave progression      Recent Labs: 01/27/2015: ALT 19 03/31/2015: Brain Natriuretic Peptide 61.0 05/12/2015: Hemoglobin 10.5*; Platelets 306; TSH 8.27* 07/26/2015: BUN 20; Creat 0.85; Potassium 4.4; Sodium 138    Lipid Panel    Component Value Date/Time   CHOL 242* 01/27/2015 1238   TRIG 141 01/27/2015 1238   HDL 52 01/27/2015 1238   CHOLHDL 4.7 01/27/2015 1238   VLDL 28 01/27/2015 1238   LDLCALC 162* 01/27/2015 1238   LDLDIRECT 178* 05/12/2015 1229     Wt Readings from Last 3 Encounters:  08/05/15 105.416 kg (232 lb 6.4 oz)  07/26/15 105.688 kg (233 lb)  07/21/15 103.511 kg (228 lb 3.2 oz)     Other studies Reviewed: Additional studies/ records that were reviewed today include: Office notes, hospital records and testing  ASSESSMENT AND PLAN:  1.  Paroxysmal atrial fibrillation: Based on documentation from her gastroenterology procedure, she is having recurrence of atrial fibrillation. She gets brief palpitations on an occasional basis. BMET normal last month start flecainide 50 bid f/u afib clinic and ETT in 2 weeks No history of CAD normal myovue  03/2015   2. Anticoagulation: CHADS2VASC=5. She was previously on Xarelto, but it was discontinued for an upper GI bleed. 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 Because of her epigastric symptoms, she had an EGD 07/06/2015. It showed no significant abnormality, specifically no vascular ectasia.  The risks and benefits of anticoagulation were discussed with the patient. As the source of her GI bleed was successfully treated in 2013 and follow-up endoscopy showed no recurrence, I feel she is at acceptable risk for anticoagulation.Xarelto restarted   Flecainide 50 bid  ETT ordered f/u EP/Afib clinic and me in 3 months   [name]

## 2015-08-05 ENCOUNTER — Encounter: Payer: Self-pay | Admitting: Cardiovascular Disease

## 2015-08-05 ENCOUNTER — Ambulatory Visit (INDEPENDENT_AMBULATORY_CARE_PROVIDER_SITE_OTHER): Payer: Medicare Other | Admitting: Cardiovascular Disease

## 2015-08-05 VITALS — BP 133/78 | HR 82 | Ht 64.0 in | Wt 232.4 lb

## 2015-08-05 DIAGNOSIS — I2510 Atherosclerotic heart disease of native coronary artery without angina pectoris: Secondary | ICD-10-CM | POA: Diagnosis not present

## 2015-08-05 DIAGNOSIS — I10 Essential (primary) hypertension: Secondary | ICD-10-CM

## 2015-08-05 MED ORDER — NITROGLYCERIN 0.4 MG SL SUBL: 0.4000 mg | SUBLINGUAL_TABLET | SUBLINGUAL | Status: DC | PRN

## 2015-08-05 MED ORDER — FLECAINIDE ACETATE 50 MG PO TABS: 50.0000 mg | ORAL_TABLET | Freq: Two times a day (BID) | ORAL | Status: DC

## 2015-08-05 NOTE — Addendum Note (Signed)
Addended by: Aris Georgia, Soniyah Mcglory L on: 08/05/2015 10:03 AM   Modules accepted: Orders, SmartSet

## 2015-08-05 NOTE — Patient Instructions (Addendum)
  Medication Instructions:  Your physician has recommended you make the following change in your medication:  1-START Flecainide 50 mg by mouth twice daily  Lab work: NONE  Testing/Procedures: Your physician has requested that you have an exercise tolerance test in two weeks. For further information please visit [email or web address]. Please also follow instruction sheet, as given.  Follow-Up: Your physician wants you to follow-up in: 3 months with Dr. Johnsie Cancel.   Your physician recommends that you schedule a follow-up appointment in: 3 to 4 weeks in [facility].

## 2015-08-11 ENCOUNTER — Ambulatory Visit: Payer: Medicare Other | Admitting: Family Medicine

## 2015-08-17 ENCOUNTER — Ambulatory Visit (INDEPENDENT_AMBULATORY_CARE_PROVIDER_SITE_OTHER): Payer: Medicare Other | Admitting: Family Medicine

## 2015-08-17 ENCOUNTER — Encounter: Payer: Self-pay | Admitting: Family Medicine

## 2015-08-17 VITALS — BP 155/85 | HR 70 | Temp 98.1°F | Ht 64.0 in | Wt 233.6 lb

## 2015-08-17 DIAGNOSIS — N183 Chronic kidney disease, stage 3 unspecified: Secondary | ICD-10-CM

## 2015-08-17 DIAGNOSIS — I480 Paroxysmal atrial fibrillation: Secondary | ICD-10-CM

## 2015-08-17 DIAGNOSIS — I10 Essential (primary) hypertension: Secondary | ICD-10-CM | POA: Diagnosis not present

## 2015-08-17 DIAGNOSIS — E1122 Type 2 diabetes mellitus with diabetic chronic kidney disease: Secondary | ICD-10-CM | POA: Diagnosis not present

## 2015-08-17 MED ORDER — GLIPIZIDE ER 10 MG PO TB24: 20.0000 mg | ORAL_TABLET | Freq: Every day | ORAL | Status: DC

## 2015-08-17 MED ORDER — SITAGLIPTIN PHOSPHATE 50 MG PO TABS: 50.0000 mg | ORAL_TABLET | Freq: Every day | ORAL | Status: DC

## 2015-08-17 NOTE — Patient Instructions (Signed)
We will try the Januvia. Keep an eye on your home blood sugar and pressure. See me in August and we will make further changes. Please consider insulin.  It is less expensive and the way to avoid complications is to control your diabetes.

## 2015-08-18 NOTE — Assessment & Plan Note (Signed)
Poor control.  Cannot afford victoza.  Increase glipizide (dubious help)  Add Januvia (can she afford?)  I will continue to suggest/insist that now is the time for insulin.

## 2015-08-18 NOTE — Progress Notes (Signed)
   Subjective:    Patient ID: Courtney Pena, female    DOB: 1940/08/13, 75 y.o.   MRN: [id number]  HPI  Main issue is DM.  Control is poor.  She is now in the donut hole and cannot afford Victoza.  Control is poor even with Victoza.  I tried again with insulin saying better and cheaper.  She has the false belief that insulin will lead to renal failure and death.  Seen by cards for a fib.  Started on [employer].  No palpitations. Hypertension remains elevated.  She is committed to improved diet.     Review of Systems     Objective:   Physical Exam Lungs clear Cardiac RRR without m or g         Assessment & Plan:

## 2015-08-18 NOTE — Assessment & Plan Note (Signed)
Now in sinus by exam

## 2015-08-18 NOTE — Assessment & Plan Note (Signed)
Poor control.  She states she will improve diet and check home BP. No change but close fu

## 2015-08-19 ENCOUNTER — Ambulatory Visit: Payer: Medicare Other

## 2015-08-26 ENCOUNTER — Ambulatory Visit (INDEPENDENT_AMBULATORY_CARE_PROVIDER_SITE_OTHER): Payer: Medicare Other

## 2015-08-26 ENCOUNTER — Other Ambulatory Visit: Payer: Self-pay | Admitting: Cardiovascular Disease

## 2015-08-26 DIAGNOSIS — I10 Essential (primary) hypertension: Secondary | ICD-10-CM | POA: Diagnosis not present

## 2015-08-26 DIAGNOSIS — I2510 Atherosclerotic heart disease of native coronary artery without angina pectoris: Secondary | ICD-10-CM

## 2015-08-26 LAB — EXERCISE TOLERANCE TEST
Estimated workload: 2.6 METS
Exercise duration (sec): 1 s
MPHR: 145 {beats}/min
Peak HR: 86 {beats}/min
Percent HR: 59 %
RPE: 16
Rest HR: 61 {beats}/min

## 2015-09-02 ENCOUNTER — Ambulatory Visit (HOSPITAL_COMMUNITY)
Admission: RE | Admit: 2015-09-02 | Discharge: 2015-09-02 | Disposition: A | Discharge status: Home / Self Care | Payer: Medicare Other | Source: Ambulatory Visit | Attending: Nurse Practitioner | Admitting: Nurse Practitioner

## 2015-09-02 ENCOUNTER — Encounter (HOSPITAL_COMMUNITY): Payer: Self-pay | Admitting: Nurse Practitioner

## 2015-09-02 VITALS — BP 169/90 | HR 73 | Ht 64.0 in | Wt 238.4 lb

## 2015-09-02 DIAGNOSIS — I129 Hypertensive chronic kidney disease with stage 1 through stage 4 chronic kidney disease, or unspecified chronic kidney disease: Secondary | ICD-10-CM | POA: Insufficient documentation

## 2015-09-02 DIAGNOSIS — G4730 Sleep apnea, unspecified: Secondary | ICD-10-CM | POA: Insufficient documentation

## 2015-09-02 DIAGNOSIS — R9431 Abnormal electrocardiogram [ECG] [EKG]: Secondary | ICD-10-CM | POA: Insufficient documentation

## 2015-09-02 DIAGNOSIS — I4891 Unspecified atrial fibrillation: Secondary | ICD-10-CM | POA: Diagnosis present

## 2015-09-02 DIAGNOSIS — Z79899 Other long term (current) drug therapy: Secondary | ICD-10-CM | POA: Diagnosis not present

## 2015-09-02 DIAGNOSIS — I440 Atrioventricular block, first degree: Secondary | ICD-10-CM | POA: Insufficient documentation

## 2015-09-02 DIAGNOSIS — I2510 Atherosclerotic heart disease of native coronary artery without angina pectoris: Secondary | ICD-10-CM | POA: Insufficient documentation

## 2015-09-02 DIAGNOSIS — Z7984 Long term (current) use of oral hypoglycemic drugs: Secondary | ICD-10-CM | POA: Insufficient documentation

## 2015-09-02 DIAGNOSIS — Z87891 Personal history of nicotine dependence: Secondary | ICD-10-CM | POA: Diagnosis not present

## 2015-09-02 DIAGNOSIS — K219 Gastro-esophageal reflux disease without esophagitis: Secondary | ICD-10-CM | POA: Diagnosis not present

## 2015-09-02 DIAGNOSIS — I480 Paroxysmal atrial fibrillation: Secondary | ICD-10-CM | POA: Insufficient documentation

## 2015-09-02 DIAGNOSIS — E785 Hyperlipidemia, unspecified: Secondary | ICD-10-CM | POA: Insufficient documentation

## 2015-09-02 DIAGNOSIS — Z7901 Long term (current) use of anticoagulants: Secondary | ICD-10-CM | POA: Diagnosis not present

## 2015-09-02 DIAGNOSIS — E039 Hypothyroidism, unspecified: Secondary | ICD-10-CM | POA: Diagnosis not present

## 2015-09-02 MED ORDER — RIVAROXABAN 20 MG PO TABS: 20.0000 mg | ORAL_TABLET | Freq: Every day | ORAL | Status: DC

## 2015-09-02 NOTE — Progress Notes (Signed)
Patient ID: Courtney Pena, female   DOB: 11-14-1940, 75 y.o.   MRN: [id number]     Primary Care Physician: Zigmund Gottron, MD Referring Physician: Dr. Caryl Never Rudesill is a 75 y.o. 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 Johnsie Cancel and is here for f/u in the afib clinic. Other than a few intermittent flutters, she has not noticed any sustained afib. Tolerating flecainide well.Tolerating  Xarelto, no bleeding issue, but is in do-nut hole and will help fill out assistance forms today to see if she will qualify for assistance. She did have a f/u ETT, for which she did not stay on the treadmill for a minute but no EKG changes were noted with exertion.  Today, she denies symptoms of palpitations, chest pain, shortness of breath, orthopnea, PND, lower extremity edema, dizziness, presyncope, syncope, or neurologic sequela. The patient is tolerating medications without difficulties and is otherwise without complaint today.   Past Medical History  Diagnosis Date  . Allergy   . Arthritis   . Asthma   . GERD (gastroesophageal reflux disease)   . Hypertension   . Hyperlipidemia   . Neuromuscular disorder ([place])   . Hypothyroidism   . Ulcer   . Gastritis   . Hiatal hernia   . Obesity   . Personal history of colonic polyps 06/06/2009    cecal polyp  . Atrial flutter ([place]) 12-07-2010    converted in ED with 300 mg flecainide  . Anemia due to GI blood loss 08/12/2011  . Gastric antral vascular ectasia     source for gi bleed in 07/2011 - Xarelto stopped  . CAD (coronary artery disease)     a. mild per cath in 2004;  b. nonischemic Myoview in March 2012;  c. Lex MV 1/14:  EF 66%, no ischemia  . External hemorrhoids 06/07/2010  . Sleep apnea     does not use CPAP  . Type 2 diabetes mellitus with diabetic chronic kidney  disease ([place]) 04/25/2006       . Chronic anticoagulation - Xarelto started 07/07/2015, CHADS2CVASC=5 07/07/2015   Past Surgical History  Procedure Laterality Date  . Cataract extraction Bilateral   . Carpal tunnel release Bilateral 2003  . Cardiac catheterization  1999&2004  . Esophagogastroduodenoscopy  08/13/2011    Procedure: ESOPHAGOGASTRODUODENOSCOPY (EGD);  Surgeon: Gatha Mayer, MD;  Location[facility] ENDOSCOPY;  Service: Endoscopy;  Laterality: N/A;  . Breast biopsy Left   . Colonoscopy    . Polypectomy      Current Outpatient Prescriptions  Medication Sig Dispense Refill  . Cholecalciferol (VITAMIN D3) 1000 UNITS CAPS Take 1 capsule by mouth daily.     . clobetasol cream (TEMOVATE) [id number] % Apply 1 application topically as directed.     . diltiazem (CARDIZEM CD) 360 MG 24 hr capsule Take 1 capsule (360 mg total) by mouth daily. 30 capsule 3  . flecainide (TAMBOCOR) 50 MG tablet Take 1 tablet (50 mg total) by mouth 2 (two) times daily. 180 tablet 3  . glipiZIDE (GLUCOTROL XL) 10 MG 24 hr tablet Take 2 tablets (20 mg total) by mouth daily with breakfast. 180 tablet 3  . iron polysaccharides (FERREX 150) 150 MG capsule Take 1 capsule (150 mg total) by mouth 2 (two) times daily. (Patient taking differently: Take 150 mg by mouth daily. ) 180 capsule 3  . levothyroxine (  SYNTHROID, LEVOTHROID) 112 MCG tablet Take 1 tablet (112 mcg total) by mouth daily. 90 tablet 3  . losartan (COZAAR) 50 MG tablet Take 1 tablet (50 mg total) by mouth daily. 30 tablet 3  . meclizine (ANTIVERT) 25 MG tablet TAKE 1 TABLET (25 MG TOTAL) BY MOUTH 3 (THREE) TIMES DAILY AS NEEDED FOR DIZZINESS. 30 tablet 6  . metFORMIN (GLUCOPHAGE) 1000 MG tablet Take 1 tablet by mouth  twice a day with meals 180 tablet 3  . metoprolol succinate (TOPROL-XL) 100 MG 24 hr tablet Take 1 tablet by mouth  daily with or immediately  following a meal 90 tablet 3  . nitroGLYCERIN (NITROSTAT) 0.4 MG SL tablet Place 1 tablet (0.4 mg total)  under the tongue every 5 (five) minutes as needed for chest pain. 25 tablet 1  . pantoprazole (PROTONIX) 40 MG tablet Take 1 tablet (40 mg total) by mouth daily. 30 tablet 3  . pravastatin (PRAVACHOL) 10 MG tablet Take 10 mg by mouth every other day. Reported on 07/26/2015    . rivaroxaban (XARELTO) 20 MG TABS tablet Take 1 tablet (20 mg total) by mouth daily with supper. 30 tablet 11  . sitaGLIPtin (JANUVIA) 50 MG tablet Take 1 tablet (50 mg total) by mouth daily. 30 tablet 3  . traMADol (ULTRAM) 50 MG tablet Take 50 mg by mouth every 6 (six) hours as needed for moderate pain or severe pain.    . Colchicine 0.6 MG CAPS Take 1 tablet by mouth daily as needed (gout/pain). Reported on 09/02/2015     No current facility-administered medications for this encounter.    Allergies  Allergen Reactions  . Lisinopril Swelling    REACTION: swelling, may have had some breathing involvement.   . Nsaids Other (See Comments)    Patient reports internal bleeding  . Penicillins Shortness Of Breath and Swelling    Arm Swelling with Penicillin (Occurred in 1960s) Breathing - throat swelling with Amoxicillin (Occurred prior to 2002)  . Sulfamethoxazole Hives and Itching    "welps all over" immediately after dose  . Actos [Pioglitazone] Swelling    REACTION: swelling all over body  . Gabapentin     Caused dysphora    Social History   Social History  . Marital Status: Married    Spouse Name: Iona Beard  . Number of Children: 4  . Years of Education: some colle   Occupational History  . Management Unemployed   Social History Main Topics  . Smoking status: Former Smoker -- 2.00 packs/day for 30 years    Types: Cigarettes    Quit date: 02/26/1993  . Smokeless tobacco: Never Used  . Alcohol Use: No  . Drug Use: No  . Sexual Activity: Not Currently   Other Topics Concern  . Not on file   Social History Narrative    Family History  Problem Relation Age of Onset  . Heart disease Father   .  Diabetes Maternal Grandfather   . Diabetes Daughter     pre-diabeties  . Colon polyps Daughter   . Hypertension Mother   . Other Mother     brain tumor-benign  . Thyroid disease Daughter     x 2  . Colon cancer Neg Hx     ROS- All systems are reviewed and negative except as per the HPI above  Physical Exam: Filed Vitals:   09/02/15 1030 09/02/15 1100  BP: 166/88 169/90  Pulse: 73   Height: 5\' 4"  (1.626 m)   Weight: 238  lb 6.4 oz (108.138 kg)     GEN- The patient is well appearing, alert and oriented x 3 today.   Head- normocephalic, atraumatic Eyes-  Sclera clear, conjunctiva pink Ears- hearing intact Oropharynx- clear Neck- supple, no JVP Lymph- no cervical lymphadenopathy Lungs- Clear to ausculation bilaterally, normal work of breathing Heart- Regular rate and rhythm, no murmurs, rubs or gallops, PMI not laterally displaced GI- soft, NT, ND, + BS Extremities- no clubbing, cyanosis, or edema MS- no significant deformity or atrophy Skin- no rash or lesion Psych- euthymic mood, full affect Neuro- strength and sensation are intact  EKG-SR with first degree AVB, pr int 214 ms, qrs int 92 ms, qtc 447 ms Epic records reviewed  ETT-The patient walked for 54 seconds of a standard Bruce protocol GXt.  she stopped due to dysnea. She achieved a peak HR of 86 which is 59% predicted maximal HR .  There was no QRS widening with this minimal exertion There were no ST or T wave changes to suggest ischemia  Assessment and Plan: 1. AFIb Staying in SR Continue flecainide 50 mg bid Continue xarelto, pt assistance forms given to pt Continue dilt/BB  2. HTN Pt on multiple meds with not optimally controlled BP Avoid salt Has gained around 5 lbs over the last few months Diet modification as well as exercise as tolerated   F/u with Dr. Johnsie Cancel as scheduled 9/28  Geroge Baseman. Coreen Shippee, [facility] [address] [place], [place]  [address] [phone]

## 2015-09-28 ENCOUNTER — Telehealth (HOSPITAL_COMMUNITY): Payer: Self-pay

## 2015-09-28 NOTE — Telephone Encounter (Signed)
Patient called CHF clinic triage and left VM asking for call back to go over some med questions. However, in looking through chart this patient belongs to [place] [facility]. Will forward to [place] to return call.  Renee Pain, RN

## 2015-09-28 NOTE — Telephone Encounter (Signed)
Pt having difficulty paying for xarelto. Was denied patient assistance and is in doughnut hole for insurance coverage. Cannot take coumadin due to history of GI bleeding.

## 2015-10-12 ENCOUNTER — Other Ambulatory Visit: Payer: Self-pay | Admitting: Family Medicine

## 2015-10-12 DIAGNOSIS — Z1231 Encounter for screening mammogram for malignant neoplasm of breast: Secondary | ICD-10-CM

## 2015-10-13 ENCOUNTER — Encounter (HOSPITAL_COMMUNITY): Payer: Self-pay | Admitting: Nurse Practitioner

## 2015-10-13 ENCOUNTER — Ambulatory Visit (HOSPITAL_COMMUNITY)
Admission: RE | Admit: 2015-10-13 | Discharge: 2015-10-13 | Disposition: A | Discharge status: Home / Self Care | Payer: Medicare Other | Source: Ambulatory Visit | Attending: Nurse Practitioner | Admitting: Nurse Practitioner

## 2015-10-13 VITALS — BP 150/94 | HR 75 | Ht 64.0 in | Wt 236.0 lb

## 2015-10-13 DIAGNOSIS — N189 Chronic kidney disease, unspecified: Secondary | ICD-10-CM | POA: Diagnosis not present

## 2015-10-13 DIAGNOSIS — E039 Hypothyroidism, unspecified: Secondary | ICD-10-CM | POA: Diagnosis not present

## 2015-10-13 DIAGNOSIS — I480 Paroxysmal atrial fibrillation: Secondary | ICD-10-CM

## 2015-10-13 DIAGNOSIS — E1122 Type 2 diabetes mellitus with diabetic chronic kidney disease: Secondary | ICD-10-CM | POA: Insufficient documentation

## 2015-10-13 DIAGNOSIS — Z8349 Family history of other endocrine, nutritional and metabolic diseases: Secondary | ICD-10-CM | POA: Insufficient documentation

## 2015-10-13 DIAGNOSIS — G4730 Sleep apnea, unspecified: Secondary | ICD-10-CM | POA: Insufficient documentation

## 2015-10-13 DIAGNOSIS — Z888 Allergy status to other drugs, medicaments and biological substances status: Secondary | ICD-10-CM | POA: Insufficient documentation

## 2015-10-13 DIAGNOSIS — Z87891 Personal history of nicotine dependence: Secondary | ICD-10-CM | POA: Insufficient documentation

## 2015-10-13 DIAGNOSIS — Z8249 Family history of ischemic heart disease and other diseases of the circulatory system: Secondary | ICD-10-CM | POA: Diagnosis not present

## 2015-10-13 DIAGNOSIS — Z6841 Body Mass Index (BMI) 40.0 and over, adult: Secondary | ICD-10-CM | POA: Diagnosis not present

## 2015-10-13 DIAGNOSIS — Z880 Allergy status to penicillin: Secondary | ICD-10-CM | POA: Insufficient documentation

## 2015-10-13 DIAGNOSIS — E785 Hyperlipidemia, unspecified: Secondary | ICD-10-CM | POA: Diagnosis not present

## 2015-10-13 DIAGNOSIS — Z882 Allergy status to sulfonamides status: Secondary | ICD-10-CM | POA: Insufficient documentation

## 2015-10-13 DIAGNOSIS — I4891 Unspecified atrial fibrillation: Secondary | ICD-10-CM | POA: Diagnosis not present

## 2015-10-13 DIAGNOSIS — I129 Hypertensive chronic kidney disease with stage 1 through stage 4 chronic kidney disease, or unspecified chronic kidney disease: Secondary | ICD-10-CM | POA: Insufficient documentation

## 2015-10-13 DIAGNOSIS — E669 Obesity, unspecified: Secondary | ICD-10-CM | POA: Diagnosis not present

## 2015-10-13 DIAGNOSIS — I2510 Atherosclerotic heart disease of native coronary artery without angina pectoris: Secondary | ICD-10-CM | POA: Insufficient documentation

## 2015-10-13 DIAGNOSIS — K219 Gastro-esophageal reflux disease without esophagitis: Secondary | ICD-10-CM | POA: Diagnosis not present

## 2015-10-13 DIAGNOSIS — I440 Atrioventricular block, first degree: Secondary | ICD-10-CM | POA: Diagnosis not present

## 2015-10-13 DIAGNOSIS — Z7901 Long term (current) use of anticoagulants: Secondary | ICD-10-CM | POA: Diagnosis not present

## 2015-10-13 DIAGNOSIS — Z7984 Long term (current) use of oral hypoglycemic drugs: Secondary | ICD-10-CM | POA: Insufficient documentation

## 2015-10-13 DIAGNOSIS — Z79899 Other long term (current) drug therapy: Secondary | ICD-10-CM | POA: Diagnosis not present

## 2015-10-13 NOTE — Progress Notes (Signed)
Patient ID: Courtney Pena, female   DOB: [birth year], 75 y.o.   MRN: [id number]     Primary Care Physician: Zigmund Gottron, MD Referring Physician: Dr. Caryl Never Mehrotra is a 75 y.o. 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 Johnsie Cancel and is here for f/u in the afib clinic. Other than a few intermittent flutters, she has not noticed any sustained afib. Tolerating flecainide well.Tolerating  Xarelto, no bleeding issue, but is in do-nut hole and will help fill out assistance forms today to see if she will qualify for assistance. She did have a f/u ETT, for which she did not stay on the treadmill for a minute but no EKG changes were noted with exertion.  Returns to afib clinic 8/17, for c/o blurred vision. She states that she has noted vision change since starting flecainide but thought it would wear off. She stopped flecainide yesterday and vision is already better. She is in SR today and since starting flecainide, afib burden has been low. She also stopped losartan because she retained fluid with previous use. [place] PCP.  Her PCP wanted her to try one more time to see if the S.E. occurred again and it did. Will f/u with PCP for BP.  Today, she denies symptoms of palpitations, chest pain, shortness of breath, orthopnea, PND, lower extremity edema, dizziness, presyncope, syncope, or neurologic sequela. The patient is tolerating medications without difficulties and is otherwise without complaint today.   Past Medical History:  Diagnosis Date  . Allergy   . Anemia due to GI blood loss 08/12/2011  . Arthritis   . Asthma   . Atrial flutter ([place]) 12-07-2010   converted in ED with 300 mg flecainide  . CAD (coronary artery disease)    a. mild per cath in 2004;  b. nonischemic Myoview in March 2012;  c. Lex MV 1/14:  EF 66%, no  ischemia  . Chronic anticoagulation - Xarelto started 07/07/2015, CHADS2CVASC=5 07/07/2015  . External hemorrhoids 06/07/2010  . Gastric antral vascular ectasia    source for gi bleed in 07/2011 - Xarelto stopped  . Gastritis   . GERD (gastroesophageal reflux disease)   . Hiatal hernia   . Hyperlipidemia   . Hypertension   . Hypothyroidism   . Neuromuscular disorder ([place])   . Obesity   . Personal history of colonic polyps 06/06/2009   cecal polyp  . Sleep apnea    does not use CPAP  . Type 2 diabetes mellitus with diabetic chronic kidney disease ([place]) 04/25/2006      . Ulcer    Past Surgical History:  Procedure Laterality Date  . BREAST BIOPSY Left   . CARDIAC CATHETERIZATION  1999&2004  . CARPAL TUNNEL RELEASE Bilateral 2003  . CATARACT EXTRACTION Bilateral   . COLONOSCOPY    . ESOPHAGOGASTRODUODENOSCOPY  08/13/2011   Procedure: ESOPHAGOGASTRODUODENOSCOPY (EGD);  Surgeon: Gatha Mayer, MD;  Location[facility] ENDOSCOPY;  Service: Endoscopy;  Laterality: N/A;  . POLYPECTOMY      Current Outpatient Prescriptions  Medication Sig Dispense Refill  . Cholecalciferol (VITAMIN D3) 1000 UNITS CAPS Take 1 capsule by mouth daily.     . clobetasol cream (TEMOVATE) [id number] % Apply 1 application topically as directed.     . Colchicine 0.6 MG CAPS Take 1 tablet by mouth daily as needed (gout/pain). Reported on 09/02/2015    . diltiazem (  CARDIZEM CD) 360 MG 24 hr capsule Take 1 capsule (360 mg total) by mouth daily. 30 capsule 3  . flecainide (TAMBOCOR) 50 MG tablet Take 1 tablet (50 mg total) by mouth 2 (two) times daily. 180 tablet 3  . glipiZIDE (GLUCOTROL XL) 10 MG 24 hr tablet Take 2 tablets (20 mg total) by mouth daily with breakfast. 180 tablet 3  . iron polysaccharides (FERREX 150) 150 MG capsule Take 1 capsule (150 mg total) by mouth 2 (two) times daily. (Patient taking differently: Take 150 mg by mouth daily. ) 180 capsule 3  . levothyroxine (SYNTHROID, LEVOTHROID) 112 MCG tablet Take 1 tablet  (112 mcg total) by mouth daily. 90 tablet 3  . losartan (COZAAR) 50 MG tablet Take 1 tablet (50 mg total) by mouth daily. 30 tablet 3  . meclizine (ANTIVERT) 25 MG tablet TAKE 1 TABLET (25 MG TOTAL) BY MOUTH 3 (THREE) TIMES DAILY AS NEEDED FOR DIZZINESS. 30 tablet 6  . metFORMIN (GLUCOPHAGE) 1000 MG tablet Take 1 tablet by mouth  twice a day with meals 180 tablet 3  . metoprolol succinate (TOPROL-XL) 100 MG 24 hr tablet Take 1 tablet by mouth  daily with or immediately  following a meal 90 tablet 3  . nitroGLYCERIN (NITROSTAT) 0.4 MG SL tablet Place 1 tablet (0.4 mg total) under the tongue every 5 (five) minutes as needed for chest pain. 25 tablet 1  . pantoprazole (PROTONIX) 40 MG tablet Take 1 tablet (40 mg total) by mouth daily. 30 tablet 3  . pravastatin (PRAVACHOL) 10 MG tablet Take 10 mg by mouth every other day. Reported on 07/26/2015    . rivaroxaban (XARELTO) 20 MG TABS tablet Take 1 tablet (20 mg total) by mouth daily with supper. 30 tablet 11  . sitaGLIPtin (JANUVIA) 50 MG tablet Take 1 tablet (50 mg total) by mouth daily. 30 tablet 3  . traMADol (ULTRAM) 50 MG tablet Take 50 mg by mouth every 6 (six) hours as needed for moderate pain or severe pain.     No current facility-administered medications for this encounter.     Allergies  Allergen Reactions  . Lisinopril Swelling    REACTION: swelling, may have had some breathing involvement.   . Nsaids Other (See Comments)    Patient reports internal bleeding  . Penicillins Shortness Of Breath and Swelling    Arm Swelling with Penicillin (Occurred in 1960s) Breathing - throat swelling with Amoxicillin (Occurred prior to 2002)  . Sulfamethoxazole Hives and Itching    "welps all over" immediately after dose  . Actos [Pioglitazone] Swelling    REACTION: swelling all over body  . Gabapentin     Caused dysphora    Social History   Social History  . Marital status: Married    Spouse name: Courtney Pena  . Number of children: 4  . Years  of education: some colle   Occupational History  . Management Unemployed   Social History Main Topics  . Smoking status: Former Smoker    Packs/day: 2.00    Years: 30.00    Types: Cigarettes    Quit date: 02/26/1993  . Smokeless tobacco: Never Used  . Alcohol use No  . Drug use: No  . Sexual activity: Not Currently   Other Topics Concern  . Not on file   Social History Narrative  . No narrative on file    Family History  Problem Relation Age of Onset  . Heart disease Father   . Diabetes Maternal Grandfather   .  Diabetes Daughter     pre-diabeties  . Colon polyps Daughter   . Hypertension Mother   . Other Mother     brain tumor-benign  . Thyroid disease Daughter     x 2  . Colon cancer Neg Hx     ROS- All systems are reviewed and negative except as per the HPI above  Physical Exam: Vitals:   10/13/15 0839  BP: (!) 150/94  Pulse: 75  Weight: 236 lb (107 kg)  Height: 5\' 4"  (1.626 m)    GEN- The patient is well appearing, alert and oriented x 3 today.   Head- normocephalic, atraumatic Eyes-  Sclera clear, conjunctiva pink Ears- hearing intact Oropharynx- clear Neck- supple, no JVP Lymph- no cervical lymphadenopathy Lungs- Clear to ausculation bilaterally, normal work of breathing Heart- Regular rate and rhythm, no murmurs, rubs or gallops, PMI not laterally displaced GI- soft, NT, ND, + BS Extremities- no clubbing, cyanosis, or edema MS- no significant deformity or atrophy Skin- no rash or lesion Psych- euthymic mood, full affect Neuro- strength and sensation are intact  EKG-SR with first degree AVB, pr int 214 ms, qrs int 92 ms, qtc 447 ms Epic records reviewed  ETT-The patient walked for 54 seconds of a standard Bruce protocol GXt.  she stopped due to dysnea. She achieved a peak HR of 86 which is 59% predicted maximal HR .  There was no QRS widening with this minimal exertion There were no ST or T wave changes to suggest ischemia  Assessment and  Plan: 1. AFIb Staying in SR with flecainide, but c/o of blurred vision with flecainide Stopped flecainide yesterday and vision has already improved Discussed options of other antiarrythmic's but pt is not thrilled re aspects of multaq, tikosyn( cost), amiodarone(side effects) She is interested in ablation but will need updated echo Echo scheduled for 8/25 Continue xarelto  Continue dilt/BB  2. HTN Pt on multiple meds with not optimally controlled BP She will contact PCP today re not tolerating losartan due to edema  Consult with Dr. Rayann Pena 8/28  Courtney Pena. Courtney Pena, [facility] [address] [place], [place] [address] [phone]

## 2015-10-13 NOTE — Patient Instructions (Signed)
Your physician has recommended you make the following change in your medication:  1)Stop Flecainide

## 2015-10-21 ENCOUNTER — Ambulatory Visit (HOSPITAL_COMMUNITY)
Admission: RE | Admit: 2015-10-21 | Discharge: 2015-10-21 | Disposition: A | Discharge status: Home / Self Care | Payer: Medicare Other | Source: Ambulatory Visit | Attending: Nurse Practitioner | Admitting: Nurse Practitioner

## 2015-10-21 ENCOUNTER — Encounter: Payer: Self-pay | Admitting: Internal Medicine

## 2015-10-21 DIAGNOSIS — I110 Hypertensive heart disease with heart failure: Secondary | ICD-10-CM | POA: Insufficient documentation

## 2015-10-21 DIAGNOSIS — J449 Chronic obstructive pulmonary disease, unspecified: Secondary | ICD-10-CM | POA: Insufficient documentation

## 2015-10-21 DIAGNOSIS — I509 Heart failure, unspecified: Secondary | ICD-10-CM | POA: Diagnosis not present

## 2015-10-21 DIAGNOSIS — I252 Old myocardial infarction: Secondary | ICD-10-CM | POA: Insufficient documentation

## 2015-10-21 DIAGNOSIS — I2510 Atherosclerotic heart disease of native coronary artery without angina pectoris: Secondary | ICD-10-CM | POA: Diagnosis not present

## 2015-10-21 DIAGNOSIS — I480 Paroxysmal atrial fibrillation: Secondary | ICD-10-CM | POA: Insufficient documentation

## 2015-10-21 DIAGNOSIS — E785 Hyperlipidemia, unspecified: Secondary | ICD-10-CM | POA: Insufficient documentation

## 2015-10-21 DIAGNOSIS — E119 Type 2 diabetes mellitus without complications: Secondary | ICD-10-CM | POA: Insufficient documentation

## 2015-10-21 DIAGNOSIS — I4891 Unspecified atrial fibrillation: Secondary | ICD-10-CM | POA: Diagnosis present

## 2015-10-21 NOTE — Progress Notes (Signed)
  Echocardiogram 2D Echocardiogram has been performed.  Jennette Dubin 10/21/2015, 3:56 PM

## 2015-10-24 ENCOUNTER — Encounter: Payer: Self-pay | Admitting: Internal Medicine

## 2015-10-24 ENCOUNTER — Encounter (INDEPENDENT_AMBULATORY_CARE_PROVIDER_SITE_OTHER): Payer: Self-pay

## 2015-10-24 ENCOUNTER — Ambulatory Visit (INDEPENDENT_AMBULATORY_CARE_PROVIDER_SITE_OTHER): Payer: Medicare Other | Admitting: Internal Medicine

## 2015-10-24 VITALS — BP 142/80 | HR 66 | Ht 66.0 in | Wt 235.2 lb

## 2015-10-24 DIAGNOSIS — G4730 Sleep apnea, unspecified: Secondary | ICD-10-CM

## 2015-10-24 DIAGNOSIS — I480 Paroxysmal atrial fibrillation: Secondary | ICD-10-CM | POA: Diagnosis not present

## 2015-10-24 DIAGNOSIS — I10 Essential (primary) hypertension: Secondary | ICD-10-CM | POA: Diagnosis not present

## 2015-10-24 DIAGNOSIS — Z7901 Long term (current) use of anticoagulants: Secondary | ICD-10-CM

## 2015-10-24 NOTE — Progress Notes (Signed)
Electrophysiology Office Note   Date:  10/24/2015   ID:  Courtney Pena, DOB 09/19/1940, MRN [id number]  PCP:  Zigmund Gottron, MD  Cardiologist:  Dr Johnsie Cancel Primary Electrophysiologist: Thompson Grayer, MD    Chief Complaint  Patient presents with  . Atrial Fibrillation     History of Present Illness: Courtney Pena is a 75 y.o. female who presents today for electrophysiology evaluation.   She reports having afib for several years.  She was previously treated with xarelto but had to stop this due to GI bleeding.  Her GI bleeding was treated successfully and she is now back on xarelto.  She has a h/o OSA.  She has lost weight  (down from 309 lbs) and is not currently being treated.  She has been recently started on flecainide.  She did not tolerate this due to dizziness.  She has chronic SOB and poor exercise tolerance.  She reports that she will have afib lasting several days at a time.  + cough and URI symptoms for 2 weeks.  + fluid retention chronically.   Today, she denies symptoms of palpitations, chest pain, orthopnea, PND, lower extremity edema, claudication, dizziness, presyncope, syncope, bleeding, or neurologic sequela. The patient is tolerating medications without difficulties and is otherwise without complaint today.    Past Medical History:  Diagnosis Date  . Allergy   . Anemia due to GI blood loss 08/12/2011  . Arthritis   . Asthma   . Atrial flutter ([place]) 12-07-2010   converted in ED with 300 mg flecainide  . CAD (coronary artery disease)    a. mild per cath in 2004;  b. nonischemic Myoview in March 2012;  c. Lex MV 1/14:  EF 66%, no ischemia  . Chronic anticoagulation - Xarelto started 07/07/2015, CHADS2CVASC=5 07/07/2015  . External hemorrhoids 06/07/2010  . Gastric antral vascular ectasia    source for gi bleed in 07/2011 - Xarelto stopped  . Gastritis   . GERD (gastroesophageal reflux disease)   . Hiatal hernia   . Hyperlipidemia   .  Hypertension   . Hypothyroidism   . Neuromuscular disorder ([place])   . Obesity   . Personal history of colonic polyps 06/06/2009   cecal polyp  . Sleep apnea    does not use CPAP  . Type 2 diabetes mellitus with diabetic chronic kidney disease ([place]) 04/25/2006      . Ulcer    Past Surgical History:  Procedure Laterality Date  . BREAST BIOPSY Left   . CARDIAC CATHETERIZATION  1999&2004  . CARPAL TUNNEL RELEASE Bilateral 2003  . CATARACT EXTRACTION Bilateral   . COLONOSCOPY    . ESOPHAGOGASTRODUODENOSCOPY  08/13/2011   Procedure: ESOPHAGOGASTRODUODENOSCOPY (EGD);  Surgeon: Gatha Mayer, MD;  Location: [facility] ENDOSCOPY;  Service: Endoscopy;  Laterality: N/A;  . POLYPECTOMY       Current Outpatient Prescriptions  Medication Sig Dispense Refill  . Cholecalciferol (VITAMIN D3) 1000 UNITS CAPS Take 1 capsule by mouth daily.     . clobetasol cream (TEMOVATE) [id number] % Apply 1 application topically as directed.     . Colchicine 0.6 MG CAPS Take 1 tablet by mouth daily as needed (gout/pain). Reported on 09/02/2015    . diltiazem (CARDIZEM CD) 360 MG 24 hr capsule Take 1 capsule (360 mg total) by mouth daily. 30 capsule 3  . glipiZIDE (GLUCOTROL XL) 10 MG 24 hr tablet Take 2 tablets (20 mg total) by mouth daily with breakfast. 180 tablet 3  . iron polysaccharides (NIFEREX) 150  MG capsule Take 150 mg by mouth daily.    Marland Kitchen levothyroxine (SYNTHROID, LEVOTHROID) 112 MCG tablet Take 1 tablet (112 mcg total) by mouth daily. 90 tablet 3  . meclizine (ANTIVERT) 25 MG tablet TAKE 1 TABLET (25 MG TOTAL) BY MOUTH 3 (THREE) TIMES DAILY AS NEEDED FOR DIZZINESS. 30 tablet 6  . metFORMIN (GLUCOPHAGE) 1000 MG tablet Take 1 tablet by mouth  twice a day with meals 180 tablet 3  . metoprolol succinate (TOPROL-XL) 100 MG 24 hr tablet Take 1 tablet by mouth  daily with or immediately  following a meal 90 tablet 3  . nitroGLYCERIN (NITROSTAT) 0.4 MG SL tablet Place 1 tablet (0.4 mg total) under the tongue every 5 (five)  minutes as needed for chest pain. 25 tablet 1  . pravastatin (PRAVACHOL) 10 MG tablet Take 10 mg by mouth every other day. Reported on 07/26/2015    . rivaroxaban (XARELTO) 20 MG TABS tablet Take 1 tablet (20 mg total) by mouth daily with supper. 30 tablet 11  . traMADol (ULTRAM) 50 MG tablet Take 50 mg by mouth every 6 (six) hours as needed for moderate pain or severe pain.     No current facility-administered medications for this visit.     Allergies:   Lisinopril; Nsaids; Penicillins; Sulfamethoxazole; Actos [pioglitazone]; Flecainide; Gabapentin; and Losartan potassium   Social History:  The patient  reports that she quit smoking about 22 years ago. Her smoking use included Cigarettes. She has a 60.00 pack-year smoking history. She has never used smokeless tobacco. She reports that she does not drink alcohol or use drugs.   Family History:  The patient's  family history includes Colon polyps in her daughter; Diabetes in her daughter and maternal grandfather; Heart disease in her father; Hypertension in her mother; Other in her mother; Thyroid disease in her daughter.    ROS:  Please see the history of present illness.   All other systems are reviewed and negative.    PHYSICAL EXAM: VS:  BP (!) 142/80   Pulse 66   Ht 5\' 6"  (1.676 m)   Wt 235 lb 3.2 oz (106.7 kg)   BMI 37.96 kg/m  , BMI Body mass index is 37.96 kg/m. GEN: overweight, in no acute distress  HEENT: normal  Neck: no JVD, carotid bruits, or masses Cardiac: RRR; no murmurs, rubs, or gallops,no edema  Respiratory:  clear to auscultation bilaterally, normal work of breathing, coughs frequently in the office today GI: soft, nontender, nondistended, + BS MS: no deformity or atrophy  Skin: warm and dry  Neuro:  Strength and sensation are intact Psych: euthymic mood, full affect  EKG:  EKG is ordered today. The ekg ordered today shows sinus rhythm, PR 212 msec, QTc 454 msec   Recent Labs: 01/27/2015: ALT 19 03/31/2015:  Brain Natriuretic Peptide 61.0 05/12/2015: Hemoglobin 10.5; Platelets 306; TSH 8.27 07/26/2015: BUN 20; Creat 0.85; Potassium 4.4; Sodium 138    Lipid Panel     Component Value Date/Time   CHOL 242 (H) 01/27/2015 1238   TRIG 141 01/27/2015 1238   HDL 52 01/27/2015 1238   CHOLHDL 4.7 01/27/2015 1238   VLDL 28 01/27/2015 1238   LDLCALC 162 (H) 01/27/2015 1238   LDLDIRECT 178 (H) 05/12/2015 1229     Wt Readings from Last 3 Encounters:  10/24/15 235 lb 3.2 oz (106.7 kg)  10/13/15 236 lb (107 kg)  09/02/15 238 lb 6.4 oz (108.1 kg)      Other studies Reviewed: Additional studies/  records that were reviewed today include: AF clinic notes, Dr Mariana Arn, notes, prior myoview and echo  Review of the above records today demonstrates: preserved EF   ASSESSMENT AND PLAN:  1.  Paroxysmal atrial fibrillation and atrial flutter The patient has symptomatic atrial arrhythmias.  She has failed medical therapy with flecainide.  rhythmol was discussed as a good alternative.   Therapeutic strategies for afib/ atrial flutter including medicine and ablation were discussed in detail with the patient today. Risk, benefits, and alternatives to EP study and radiofrequency ablation  were also discussed in detail today. These risks include but are not limited to stroke, bleeding, vascular damage, tamponade, perforation, damage to the esophagus, lungs, and other structures, pulmonary vein stenosis, worsening renal function, and death. The patient understands these risk and wishes to think about this further.  She would require cardiac CT within 1 week of ablation if we proceed. chads2vasc score is 5.  The importance of long term anticoagulation discussed. We also discussed lifestyle modification including regular exercise, weight loss, and compliance with OSA management.  2. OSA Will refer back to Dr Erik Obey for additional management  3. Obesity Body mass index is 37.96 kg/m. Weight loss advised I have  reviewed the patients BMI and decreased success rates with ablation at length today.  Weight loss is strongly advised.  Per Guijian et al (PACE 2013; [id number]), patients with BMI 25-29.9 (obese) have a 27% increase in AF recurrence post ablation.  Patients with BMI >30 have a 31% increase in AF recurrence post ablation when compared to those with BMI <25.  4. HTN Stable No change required today  5. Fluid retention/ diastolic dysfunction Likely exacerbated by obesity hctz stopped previously due to gout 2 gram sodium diet encouraged  Follow-up: she will contact my office if she decides to pursue ablation.  Otherwise, she will follow-up with Butch Penny in the AF clinic and Dr Johnsie Cancel and I will see when needed  Current medicines are reviewed at length with the patient today.   The patient does not have concerns regarding her medicines.  The following changes were made today:  none  Signed, Thompson Grayer, MD  10/24/2015 9:03 AM     [facility] [address] [place]  [place] [address] [phone] (office) [phone] (fax)

## 2015-10-24 NOTE — Patient Instructions (Addendum)
Medication Instructions:  Your physician recommends that you continue on your current medications as directed. Please refer to the Current Medication list given to you today.    Labwork: None ordered   Testing/Procedures: Your physician has recommended that you have an ablation. Catheter ablation is a medical procedure used to treat some cardiac arrhythmias (irregular heartbeats). During catheter ablation, a long, thin, flexible tube is put into a blood vessel in your groin (upper thigh), or neck. This tube is called an ablation catheter. It is then guided to your heart through the blood vessel. Radio frequency waves destroy small areas of heart tissue where abnormal heartbeats may cause an arrhythmia to start. Please see the instruction sheet given to you today.  Call if you decided to proceed with ablation.  Leave message for Claiborne Billings at [phone]    Follow-Up:  You have been referred to Dr Erik Obey-- for sleep apnea--has seen before for same problem   Your physician recommends that you schedule a follow-up appointment as needed   Any Other Special Instructions Will Be Listed Below (If Applicable).     If you need a refill on your cardiac medications before your next appointment, please call your pharmacy.

## 2015-10-26 ENCOUNTER — Ambulatory Visit: Payer: Medicare Other

## 2015-10-29 ENCOUNTER — Other Ambulatory Visit: Payer: Self-pay | Admitting: Family Medicine

## 2015-10-29 DIAGNOSIS — I10 Essential (primary) hypertension: Secondary | ICD-10-CM

## 2015-11-01 NOTE — Telephone Encounter (Signed)
Dear Dema Severin Team I am getting a refill request for hr losarton but it looks like Hensel discontinued it last month. Can u call her and see if she is SUPPOSED to be taking it? OR  Why he discontinued it?  OR why the pharmacyis wanting me to refill--it may be on automatic refill or so mething,. I am confused THANKS! Courtney Pena

## 2015-11-02 ENCOUNTER — Ambulatory Visit: Payer: Medicare Other

## 2015-11-03 NOTE — Telephone Encounter (Signed)
Dr. Andria Frames did refill this medication on 11/02/15. Katharina Caper, April D, [place]

## 2015-11-09 ENCOUNTER — Ambulatory Visit (INDEPENDENT_AMBULATORY_CARE_PROVIDER_SITE_OTHER): Payer: Medicare Other | Admitting: Family Medicine

## 2015-11-09 ENCOUNTER — Encounter: Payer: Self-pay | Admitting: Family Medicine

## 2015-11-09 VITALS — BP 169/78 | HR 71 | Temp 97.8°F | Ht 66.0 in | Wt 236.4 lb

## 2015-11-09 DIAGNOSIS — E038 Other specified hypothyroidism: Secondary | ICD-10-CM

## 2015-11-09 DIAGNOSIS — R109 Unspecified abdominal pain: Secondary | ICD-10-CM

## 2015-11-09 DIAGNOSIS — R740 Nonspecific elevation of levels of transaminase and lactic acid dehydrogenase [LDH]: Secondary | ICD-10-CM

## 2015-11-09 DIAGNOSIS — G4730 Sleep apnea, unspecified: Secondary | ICD-10-CM | POA: Diagnosis not present

## 2015-11-09 DIAGNOSIS — E039 Hypothyroidism, unspecified: Secondary | ICD-10-CM

## 2015-11-09 DIAGNOSIS — N183 Chronic kidney disease, stage 3 unspecified: Secondary | ICD-10-CM

## 2015-11-09 DIAGNOSIS — I480 Paroxysmal atrial fibrillation: Secondary | ICD-10-CM

## 2015-11-09 DIAGNOSIS — Z23 Encounter for immunization: Secondary | ICD-10-CM | POA: Diagnosis not present

## 2015-11-09 DIAGNOSIS — I10 Essential (primary) hypertension: Secondary | ICD-10-CM | POA: Diagnosis not present

## 2015-11-09 DIAGNOSIS — E1122 Type 2 diabetes mellitus with diabetic chronic kidney disease: Secondary | ICD-10-CM

## 2015-11-09 DIAGNOSIS — R7401 Elevation of levels of liver transaminase levels: Secondary | ICD-10-CM

## 2015-11-09 DIAGNOSIS — Z9049 Acquired absence of other specified parts of digestive tract: Secondary | ICD-10-CM

## 2015-11-09 DIAGNOSIS — E1169 Type 2 diabetes mellitus with other specified complication: Secondary | ICD-10-CM

## 2015-11-09 DIAGNOSIS — R1011 Right upper quadrant pain: Secondary | ICD-10-CM | POA: Insufficient documentation

## 2015-11-09 DIAGNOSIS — E785 Hyperlipidemia, unspecified: Secondary | ICD-10-CM

## 2015-11-09 HISTORY — DX: Acquired absence of other specified parts of digestive tract: Z90.49

## 2015-11-09 LAB — POCT URINALYSIS DIPSTICK
Bilirubin, UA: NEGATIVE
Blood, UA: NEGATIVE
Glucose, UA: 500
Ketones, UA: NEGATIVE
Leukocytes, UA: NEGATIVE
Nitrite, UA: NEGATIVE
Protein, UA: 100
Spec Grav, UA: 1.02
Urobilinogen, UA: 0.2
pH, UA: 5.5

## 2015-11-09 LAB — POCT UA - MICROSCOPIC ONLY

## 2015-11-09 LAB — CBC
HCT: 35.1 % (ref 35.0–45.0)
Hemoglobin: 10.9 g/dL — ABNORMAL LOW (ref 11.7–15.5)
MCH: 26.8 pg — ABNORMAL LOW (ref 27.0–33.0)
MCHC: 31.1 g/dL — ABNORMAL LOW (ref 32.0–36.0)
MCV: 86.2 fL (ref 80.0–100.0)
MPV: 11.7 fL (ref 7.5–12.5)
Platelets: 271 10*3/uL (ref 140–400)
RBC: 4.07 MIL/uL (ref 3.80–5.10)
RDW: 16.6 % — ABNORMAL HIGH (ref 11.0–15.0)
WBC: 5.4 10*3/uL (ref 3.8–10.8)

## 2015-11-09 LAB — POCT GLYCOSYLATED HEMOGLOBIN (HGB A1C): Hemoglobin A1C: 10.1

## 2015-11-09 LAB — TSH: TSH: 5.76 mIU/L — ABNORMAL HIGH

## 2015-11-09 LAB — COMPLETE METABOLIC PANEL WITH GFR
ALT: 63 U/L — ABNORMAL HIGH (ref 6–29)
AST: 53 U/L — ABNORMAL HIGH (ref 10–35)
Albumin: 4.2 g/dL (ref 3.6–5.1)
Alkaline Phosphatase: 116 U/L (ref 33–130)
BUN: 24 mg/dL (ref 7–25)
CO2: 25 mmol/L (ref 20–31)
Calcium: 9.6 mg/dL (ref 8.6–10.4)
Chloride: 102 mmol/L (ref 98–110)
Creat: 0.86 mg/dL (ref 0.60–0.93)
GFR, Est African American: 76 mL/min (ref >=60)
GFR, Est Non African American: 66 mL/min (ref >=60)
Glucose, Bld: 343 mg/dL — ABNORMAL HIGH (ref 65–99)
Potassium: 4.6 mmol/L (ref 3.5–5.3)
Sodium: 135 mmol/L (ref 135–146)
Total Bilirubin: 0.3 mg/dL (ref 0.2–1.2)
Total Protein: 7.7 g/dL (ref 6.1–8.1)

## 2015-11-09 MED ORDER — HYDROCHLOROTHIAZIDE 12.5 MG PO TABS: 12.5000 mg | ORAL_TABLET | Freq: Every day | ORAL | 3 refills | Status: DC

## 2015-11-09 NOTE — Patient Instructions (Signed)
For now, increase exercise.  Get to the gym.  Work up to one hour five days a week.   I did add a low dose of the HCTZ for fluid and blood pressure.  Tell me if the gout flairs. I ordered a sleep study.  Someone will arrange. Flu shot today. I did a bunch of blood and urine tests for the abdominal pain.  I will call tomorrow with results. See me in one month to recheck blood pressure and weight.

## 2015-11-10 ENCOUNTER — Ambulatory Visit
Admission: RE | Admit: 2015-11-10 | Discharge: 2015-11-10 | Disposition: A | Discharge status: Home / Self Care | Payer: Medicare Other | Source: Ambulatory Visit | Attending: Family Medicine | Admitting: Family Medicine

## 2015-11-10 DIAGNOSIS — R7401 Elevation of levels of liver transaminase levels: Secondary | ICD-10-CM | POA: Insufficient documentation

## 2015-11-10 DIAGNOSIS — Z1231 Encounter for screening mammogram for malignant neoplasm of breast: Secondary | ICD-10-CM

## 2015-11-10 DIAGNOSIS — R740 Nonspecific elevation of levels of transaminase and lactic acid dehydrogenase [LDH]: Secondary | ICD-10-CM

## 2015-11-10 MED ORDER — LEVOTHYROXINE SODIUM 112 MCG PO TABS: 112.0000 ug | ORAL_TABLET | Freq: Every day | ORAL | 3 refills | Status: DC

## 2015-11-10 NOTE — Progress Notes (Signed)
   Subjective:    Patient ID: Courtney Pena, female    DOB: 1940-12-17, 75 y.o.   MRN: [id number]  HPI Courtney Pena has multi complaints. 1. Right lower quadrent and groin pain.  Some frequency.  No change in bowels.  No vomiting. 2. Cough/cold x 3 weeks.  Slowly improving 3. Previously dxed with sleep apnea.  Did not wear mask.  Now wants repeat study since wore mask in hosp and slept much better. 4. BP could not toleration valsartin 5. Could not tolerate flecanide. Palpitations are infrequent. 6. Mult med intolerances and med beliefs.  Not on statin because intolerant to all despite multiple attempts.  DM poorly controled.  Now off liraglutide due to cost and being in donut hole.  Refuses insulin. 7. At high risk for CAD, CVA.  Poorly controled DM, HBP, and hypercholesterolemia due to med refusal/intolerance.      Review of Systems     Objective:   Physical Exam  Lungs clear RRR without m or g Abd benign Ext 1+ edema.          Assessment & Plan:

## 2015-11-10 NOTE — Assessment & Plan Note (Signed)
Surprising new on CMP.  Old echo suggest fatty liver.  Will need further WU.  Patient informed and asked to make appointment at earliest convenience for further testing.

## 2015-11-10 NOTE — Assessment & Plan Note (Signed)
Poor control.  Refuses insulin and cannot afford liraglutide.  Exercise, exercise, exercise.

## 2015-11-10 NOTE — Assessment & Plan Note (Signed)
Exercise

## 2015-11-10 NOTE — Assessment & Plan Note (Signed)
Best I have been able to do with statin is low dose pravachol qod.

## 2015-11-10 NOTE — Assessment & Plan Note (Signed)
Called about elevated TSH.  She checked the bottle and was only taking 100 mcg of synthroid.  Increased to 112.  Recheck in 6-12 weeks.

## 2015-11-10 NOTE — Assessment & Plan Note (Signed)
Recheck sleep study and hopefully she will be compliant with mask

## 2015-11-10 NOTE — Assessment & Plan Note (Signed)
Add HCTZ and watch for gout flair.  Poor control.

## 2015-11-10 NOTE — Assessment & Plan Note (Signed)
Neg UA and no bowel changes.  Could be related to transaminitis.  FU in two weeks.

## 2015-11-10 NOTE — Assessment & Plan Note (Signed)
Stable.  Intolerant of flecanide.

## 2015-11-16 ENCOUNTER — Ambulatory Visit: Payer: Medicare Other

## 2015-11-17 NOTE — Progress Notes (Signed)
Patient ID: Courtney Pena, female   DOB: 11/03/1940, 75 y.o.   MRN: [id number]     Cardiology Office Note   Date:  11/24/2015   ID:  Courtney Pena, DOB June 16, 1940, MRN [id number]  PCP:  Zigmund Gottron, MD  Cardiologist:  Dr Oswaldo Conroy, MD   Chief Complaint  Patient presents with  . Atherosclerosis of native coronary artery of native heart wi  . PAF    History of Present Illness:  Courtney Pena is a 75 y.o.  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 Other than a few intermittent flutters, she has not noticed any sustained afib. Tolerating flecainide well.. She did have a f/u ETT, for which she did not stay on the treadmill for a minute but no EKG changes were noted with exertion.  Seen in afib clinic 8/17, for c/o blurred vision. She states that she has noted vision change since starting flecainide but thought it would wear off. She stopped flecainide  and vision is already better.  She also stopped losartan because she retained fluid with previous use  She saw Dr Rayann Heman on 09/2815 and discussed options of ablation or rhythmol but ultimately decided Not to proceed with either    Past Medical History:  Diagnosis Date  . Allergy   . Anemia due to GI blood loss 08/12/2011  . Arthritis   . Asthma   . Atrial flutter ([place]) 12-07-2010   converted in ED with 300 mg flecainide  . CAD (coronary artery disease)    a. mild per cath in 2004;  b. nonischemic Myoview in March 2012;  c. Lex MV 1/14:  EF 66%, no ischemia  . Chronic anticoagulation - Xarelto started 07/07/2015, CHADS2CVASC=5 07/07/2015  . External hemorrhoids 06/07/2010  . Gastric antral vascular ectasia    source for gi bleed in 07/2011 - Xarelto stopped  . Gastritis   . GERD (gastroesophageal reflux disease)   . Hiatal  hernia   . Hyperlipidemia   . Hypertension   . Hypothyroidism   . Neuromuscular disorder ([place])   . Obesity   . Paroxysmal atrial fibrillation ([facility])   . Personal history of colonic polyps 06/06/2009   cecal polyp  . Sleep apnea    does not use CPAP  . Type 2 diabetes mellitus with diabetic chronic kidney disease ([place]) 04/25/2006      . Ulcer     Past Surgical History:  Procedure Laterality Date  . BREAST BIOPSY Left   . CARDIAC CATHETERIZATION  1999&2004  . CARPAL TUNNEL RELEASE Bilateral 2003  . CATARACT EXTRACTION Bilateral   . COLONOSCOPY    . ESOPHAGOGASTRODUODENOSCOPY  08/13/2011   Procedure: ESOPHAGOGASTRODUODENOSCOPY (EGD);  Surgeon: Gatha Mayer, MD;  Location: [facility] ENDOSCOPY;  Service: Endoscopy;  Laterality: N/A;  . POLYPECTOMY      Current Outpatient Prescriptions  Medication Sig Dispense Refill  . Cholecalciferol (VITAMIN D3) 1000 UNITS CAPS Take 1 capsule by mouth daily.     . clobetasol cream (TEMOVATE) [id number] % Apply 1 application topically as directed.     . Colchicine 0.6 MG CAPS Take 1 tablet by mouth daily as needed (gout/pain). Reported on 09/02/2015    . diltiazem (CARDIZEM CD) 360 MG 24 hr capsule Take 1 capsule (360 mg total) by mouth daily. 30 capsule 3  . glipiZIDE (GLUCOTROL XL) 10 MG 24 hr tablet  Take 2 tablets (20 mg total) by mouth daily with breakfast. 180 tablet 3  . hydrochlorothiazide (HYDRODIURIL) 12.5 MG tablet Take 1 tablet (12.5 mg total) by mouth daily. 90 tablet 3  . iron polysaccharides (NIFEREX) 150 MG capsule Take 150 mg by mouth daily.    Marland Kitchen levothyroxine (SYNTHROID, LEVOTHROID) 112 MCG tablet Take 1 tablet (112 mcg total) by mouth daily. 90 tablet 3  . meclizine (ANTIVERT) 25 MG tablet TAKE 1 TABLET (25 MG TOTAL) BY MOUTH 3 (THREE) TIMES DAILY AS NEEDED FOR DIZZINESS. 30 tablet 6  . metFORMIN (GLUCOPHAGE) 1000 MG tablet Take 1 tablet by mouth  twice a day with meals 180 tablet 3  . metoprolol succinate (TOPROL-XL) 100 MG 24 hr tablet Take 1  tablet by mouth  daily with or immediately  following a meal 90 tablet 3  . nitroGLYCERIN (NITROSTAT) 0.4 MG SL tablet Place 1 tablet (0.4 mg total) under the tongue every 5 (five) minutes as needed for chest pain. 25 tablet 1  . pravastatin (PRAVACHOL) 10 MG tablet Take 10 mg by mouth every other day. Reported on 07/26/2015    . rivaroxaban (XARELTO) 20 MG TABS tablet Take 1 tablet (20 mg total) by mouth daily with supper. 30 tablet 11  . traMADol (ULTRAM) 50 MG tablet Take 50 mg by mouth every 6 (six) hours as needed for moderate pain or severe pain.     No current facility-administered medications for this visit.     Allergies:   Lisinopril; Nsaids; Penicillins; Sulfamethoxazole; Actos [pioglitazone]; Flecainide; Gabapentin; and Losartan potassium    Social History:  The patient  reports that she quit smoking about 22 years ago. Her smoking use included Cigarettes. She has a 60.00 pack-year smoking history. She has never used smokeless tobacco. She reports that she does not drink alcohol or use drugs.   Family History:  The patient's family history includes Colon polyps in her daughter; Diabetes in her daughter and maternal grandfather; Heart disease in her father; Hypertension in her mother; Other in her mother; Thyroid disease in her daughter.    ROS:  Please see the history of present illness. All other systems are reviewed and negative.    PHYSICAL EXAM: VS:  BP 140/60   Pulse 75   Ht 5\' 5"  (1.651 m)   Wt 104.9 kg (231 lb 3.2 oz)   SpO2 93%   BMI 38.47 kg/m  , BMI Body mass index is 38.47 kg/m. GEN: Well nourished, well developed, female in no acute distress  HEENT: normal for age  Neck: no JVD, no carotid bruit, no masses Cardiac: RRR; no murmur, no rubs, or gallops Respiratory:  clear to auscultation bilaterally, normal work of breathing GI: soft, nontender, nondistended, + BS MS: no deformity or atrophy; no edema; distal pulses are 2+ in all 4 extremities   Skin: warm and  dry, no rash Neuro:  Strength and sensation are intact Psych: euthymic mood, full affect   EKG:   07/07/15  sinus rhythm with borderline first-degree AV block, PR 210 ms  08/05/15  SR rate 82  Low voltage PR 208 msec poor R wave progression      Recent Labs: 03/31/2015: Brain Natriuretic Peptide 61.0 11/09/2015: ALT 63; BUN 24; Creat 0.86; Hemoglobin 10.9; Platelets 271; Potassium 4.6; Sodium 135; TSH 5.76    Lipid Panel    Component Value Date/Time   CHOL 242 (H) 01/27/2015 1238   TRIG 141 01/27/2015 1238   HDL 52 01/27/2015 1238   CHOLHDL  4.7 01/27/2015 1238   VLDL 28 01/27/2015 1238   LDLCALC 162 (H) 01/27/2015 1238   LDLDIRECT 178 (H) 05/12/2015 1229     Wt Readings from Last 3 Encounters:  11/24/15 104.9 kg (231 lb 3.2 oz)  11/09/15 107.2 kg (236 lb 6.4 oz)  10/24/15 106.7 kg (235 lb 3.2 oz)     Other studies Reviewed: Additional studies/ records that were reviewed today include: Office notes, hospital records and testing  ASSESSMENT AND PLAN:  1.  Paroxysmal atrial fibrillation: intolerant to flecainide In NSR now favor watching for  Now and f/u afib clinic in 6 months   2. Anticoagulation: CHADS2VASC=5. She was previously on Xarelto, but it was discontinued for an upper GI bleed. 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 Because of her epigastric symptoms, she had an EGD 07/06/2015. It showed no significant abnormality, specifically no vascular ectasia. Back on NOAC  3. HTN:  Well controlled.  Continue current medications and low sodium Dash type diet.    4. Diastolic Dysfunction:  Stable needs to lose weight  5. DM:  Major issue poorly controlled  Lab Results  Component Value Date   HGBA1C 10.1 11/09/2015   Victosa was effective but she couldn't afford it Januvia ineffective Will refer to Dr Gardenia Phlegm and see if we can do better    [name]

## 2015-11-24 ENCOUNTER — Encounter (INDEPENDENT_AMBULATORY_CARE_PROVIDER_SITE_OTHER): Payer: Self-pay

## 2015-11-24 ENCOUNTER — Encounter: Payer: Self-pay | Admitting: Cardiovascular Disease

## 2015-11-24 ENCOUNTER — Ambulatory Visit (INDEPENDENT_AMBULATORY_CARE_PROVIDER_SITE_OTHER): Payer: Medicare Other | Admitting: Cardiovascular Disease

## 2015-11-24 VITALS — BP 140/60 | HR 75 | Ht 65.0 in | Wt 231.2 lb

## 2015-11-24 DIAGNOSIS — E0865 Diabetes mellitus due to underlying condition with hyperglycemia: Secondary | ICD-10-CM

## 2015-11-24 DIAGNOSIS — I480 Paroxysmal atrial fibrillation: Secondary | ICD-10-CM

## 2015-11-24 DIAGNOSIS — I2510 Atherosclerotic heart disease of native coronary artery without angina pectoris: Secondary | ICD-10-CM | POA: Diagnosis not present

## 2015-11-24 NOTE — Patient Instructions (Addendum)
Medication Instructions:  Your physician recommends that you continue on your current medications as directed. Please refer to the Current Medication list given to you today.  Labwork: NONE  Testing/Procedures: NONE  Follow-Up: Your physician wants you to follow-up in: 6 months with Dr. Johnsie Cancel. You will receive a reminder letter in the mail two months in advance. If you don't receive a letter, please call our office to schedule the follow-up appointment.  You have been referred to Dr. Cruzita Lederer for diabetes.    If you need a refill on your cardiac medications before your next appointment, please call your pharmacy.

## 2015-12-01 ENCOUNTER — Ambulatory Visit: Payer: Medicare Other | Admitting: Family Medicine

## 2015-12-14 ENCOUNTER — Encounter (HOSPITAL_BASED_OUTPATIENT_CLINIC_OR_DEPARTMENT_OTHER): Payer: Medicare Other

## 2015-12-22 ENCOUNTER — Encounter: Payer: Self-pay | Admitting: Family Medicine

## 2015-12-22 ENCOUNTER — Ambulatory Visit (INDEPENDENT_AMBULATORY_CARE_PROVIDER_SITE_OTHER): Payer: Medicare Other | Admitting: Family Medicine

## 2015-12-22 DIAGNOSIS — R7401 Elevation of levels of liver transaminase levels: Secondary | ICD-10-CM

## 2015-12-22 DIAGNOSIS — R079 Chest pain, unspecified: Secondary | ICD-10-CM

## 2015-12-22 DIAGNOSIS — R740 Nonspecific elevation of levels of transaminase and lactic acid dehydrogenase [LDH]: Secondary | ICD-10-CM

## 2015-12-22 LAB — COMPLETE METABOLIC PANEL WITH GFR
ALT: 18 U/L (ref 6–29)
AST: 14 U/L (ref 10–35)
Albumin: 3.9 g/dL (ref 3.6–5.1)
Alkaline Phosphatase: 84 U/L (ref 33–130)
BUN: 26 mg/dL — ABNORMAL HIGH (ref 7–25)
CO2: 23 mmol/L (ref 20–31)
Calcium: 9.4 mg/dL (ref 8.6–10.4)
Chloride: 100 mmol/L (ref 98–110)
Creat: 1.07 mg/dL — ABNORMAL HIGH (ref 0.60–0.93)
GFR, Est African American: 59 mL/min — ABNORMAL LOW (ref >=60)
GFR, Est Non African American: 51 mL/min — ABNORMAL LOW (ref >=60)
Glucose, Bld: 309 mg/dL — ABNORMAL HIGH (ref 65–99)
Potassium: 4.4 mmol/L (ref 3.5–5.3)
Sodium: 134 mmol/L — ABNORMAL LOW (ref 135–146)
Total Bilirubin: 0.3 mg/dL (ref 0.2–1.2)
Total Protein: 7.4 g/dL (ref 6.1–8.1)

## 2015-12-22 NOTE — Assessment & Plan Note (Signed)
Recheck labs.  Unlikely related to her chest pain.

## 2015-12-22 NOTE — Assessment & Plan Note (Signed)
Multiple differential from musculoskeletal, to pulmonary to gI.  I am concerned with worsening.  Will proceed with CT or chest, abd and pelvis to better study.  Abd cT should also provide liver images to [facility] transaminitis.

## 2015-12-22 NOTE — Progress Notes (Signed)
   Subjective:    Patient ID: Courtney Pena, female    DOB: 1940/09/11, 75 y.o.   MRN: [id number]  HPI [name] returns with continued/worsening left lower chest pain.  Pain is described as "like shingles" but she never had the rash.  She nicely describes neuropathic pain (burning, skin is hypersensitive) in a radicular pattern - radiating under left breast in a T7, T8 estimated dermatone.  Denies back pain, trauma.  Denies SOB, cough or other pulm symptoms.  Also denies GI symptoms, no worsening dyspepsia.  Recent normal EGD.   Presumably as a coincidence, she also had some mild transaminitis on last CMP - a new finding for her.  She denies Right upper quadrent pain.  Clearly, chest pain is left sided.   This chest pain has been present for at least 2 months and is getting worse.  She is becoming increasingly concerned.   Denies constitutional symptoms of wt loss, fever, night sweats.    Review of Systems     Objective:   Physical Exam Neck, No Virchow's node Lungs clear Cardiac RRR without m or g Chest wall has a freely mobile lipoma at the ant axillary line on the left at the level of the pain.  Not attached or tender.  No boney tenderness over ribs or T spine. Abd benign.        Assessment & Plan:

## 2015-12-22 NOTE — Patient Instructions (Signed)
I will call with liver and CT results

## 2015-12-29 ENCOUNTER — Encounter (HOSPITAL_COMMUNITY): Payer: Self-pay

## 2015-12-29 ENCOUNTER — Ambulatory Visit (HOSPITAL_COMMUNITY): Payer: Medicare Other

## 2015-12-29 ENCOUNTER — Ambulatory Visit (HOSPITAL_COMMUNITY)
Admission: RE | Admit: 2015-12-29 | Discharge: 2015-12-29 | Disposition: A | Discharge status: Home / Self Care | Payer: Medicare Other | Source: Ambulatory Visit | Attending: Family Medicine | Admitting: Family Medicine

## 2015-12-29 DIAGNOSIS — R911 Solitary pulmonary nodule: Secondary | ICD-10-CM | POA: Diagnosis not present

## 2015-12-29 DIAGNOSIS — R079 Chest pain, unspecified: Secondary | ICD-10-CM

## 2015-12-29 DIAGNOSIS — K5730 Diverticulosis of large intestine without perforation or abscess without bleeding: Secondary | ICD-10-CM | POA: Diagnosis not present

## 2015-12-29 DIAGNOSIS — I700 Atherosclerosis of aorta: Secondary | ICD-10-CM | POA: Diagnosis not present

## 2015-12-29 DIAGNOSIS — I517 Cardiomegaly: Secondary | ICD-10-CM | POA: Diagnosis not present

## 2015-12-29 MED ORDER — IOPAMIDOL (ISOVUE-370) INJECTION 76%
100.0000 mL | Freq: Once | INTRAVENOUS | Status: AC | PRN
  Administered 2015-12-29: 100 mL via INTRAVENOUS

## 2015-12-29 MED ORDER — DIPHENHYDRAMINE HCL 25 MG PO CAPS
50.0000 mg | ORAL_CAPSULE | Freq: Once | ORAL | Status: AC
  Administered 2015-12-29: 50 mg via ORAL

## 2015-12-29 MED ORDER — DIMENHYDRINATE 50 MG PO TABS
25.0000 mg | ORAL_TABLET | Freq: Every evening | ORAL | Status: DC | PRN
  Filled 2015-12-29: qty 1

## 2016-01-02 ENCOUNTER — Other Ambulatory Visit: Payer: Self-pay | Admitting: Family Medicine

## 2016-01-04 ENCOUNTER — Encounter: Payer: Self-pay | Admitting: Internal Medicine

## 2016-01-04 ENCOUNTER — Ambulatory Visit (INDEPENDENT_AMBULATORY_CARE_PROVIDER_SITE_OTHER): Payer: Medicare Other | Admitting: Internal Medicine

## 2016-01-04 VITALS — BP 154/92 | HR 91 | Ht 65.0 in | Wt 235.6 lb

## 2016-01-04 DIAGNOSIS — E1122 Type 2 diabetes mellitus with diabetic chronic kidney disease: Secondary | ICD-10-CM | POA: Diagnosis not present

## 2016-01-04 DIAGNOSIS — N182 Chronic kidney disease, stage 2 (mild): Secondary | ICD-10-CM

## 2016-01-04 NOTE — Patient Instructions (Signed)
Please move all the Metformin at dinnertime (2000 mg daily).  Continue Glipizide ER 20 mg in am, before first meal of the day.  Please let me know if the sugars are consistently <80 or >200.  Call me in 2 weeks with your sugars.  Please return in 1.5 months with your sugar log.    PATIENT INSTRUCTIONS FOR TYPE 2 DIABETES:  **Please join MyChart!** - see attached instructions about how to join if you have not done so already.  DIET AND EXERCISE Diet and exercise is an important part of diabetic treatment.  We recommended aerobic exercise in the form of brisk walking (working between 40-60% of maximal aerobic capacity, similar to brisk walking) for 150 minutes per week (such as 30 minutes five days per week) along with 3 times per week performing 'resistance' training (using various gauge rubber tubes with handles) 5-10 exercises involving the major muscle groups (upper body, lower body and core) performing 10-15 repetitions (or near fatigue) each exercise. Start at half the above goal but build slowly to reach the above goals. If limited by weight, joint pain, or disability, we recommend daily walking in a swimming pool with water up to waist to reduce pressure from joints while allow for adequate exercise.    BLOOD GLUCOSES Monitoring your blood glucoses is important for continued management of your diabetes. Please check your blood glucoses 2-4 times a day: fasting, before meals and at bedtime (you can rotate these measurements - e.g. one day check before the 3 meals, the next day check before 2 of the meals and before bedtime, etc.).   HYPOGLYCEMIA (low blood sugar) Hypoglycemia is usually a reaction to not eating, exercising, or taking too much insulin/ other diabetes drugs.  Symptoms include tremors, sweating, hunger, confusion, headache, etc. Treat IMMEDIATELY with 15 grams of Carbs: . 4 glucose tablets .  cup regular juice/soda . 2 tablespoons raisins . 4 teaspoons sugar . 1  tablespoon honey Recheck blood glucose in 15 mins and repeat above if still symptomatic/blood glucose <100.  RECOMMENDATIONS TO REDUCE YOUR RISK OF DIABETIC COMPLICATIONS: * Take your prescribed MEDICATION(S) * Follow a DIABETIC diet: Complex carbs, fiber rich foods, (monounsaturated and polyunsaturated) fats * AVOID saturated/trans fats, high fat foods, >2,300 mg salt per day. * EXERCISE at least 5 times a week for 30 minutes or preferably daily.  * DO NOT SMOKE OR DRINK more than 1 drink a day. * Check your FEET every day. Do not wear tightfitting shoes. Contact us if you develop an ulcer * See your EYE doctor once a year or more if needed * Get a FLU shot once a year * Get a PNEUMONIA vaccine once before and once after age 11 years  GOALS:  * Your Hemoglobin A1c of <7%  * fasting sugars need to be <130 * after meals sugars need to be <180 (2h after you start eating) * Your Systolic BP should be [id number] or lower  * Your Diastolic BP should be 80 or lower  * Your HDL (Good Cholesterol) should be 40 or higher  * Your LDL (Bad Cholesterol) should be 100 or lower. * Your Triglycerides should be 150 or lower  * Your Urine microalbumin (kidney function) should be <30 * Your Body Mass Index should be 25 or lower    Please consider the following ways to cut down carbs and fat and increase fiber and micronutrients in your diet: - substitute whole grain for white bread or pasta - substitute brown  rice for white rice - substitute 90-calorie flat bread pieces for slices of bread when possible - substitute sweet potatoes or yams for white potatoes - substitute humus for margarine - substitute tofu for cheese when possible - substitute almond or rice milk for regular milk (would not drink soy milk daily due to concern for soy estrogen influence on breast cancer risk) - substitute dark chocolate for other sweets when possible - substitute water - can add lemon or orange slices for taste - for diet  sodas (artificial sweeteners will trick your body that you can eat sweets without getting calories and will lead you to overeating and weight gain in the long run) - do not skip breakfast or other meals (this will slow down the metabolism and will result in more weight gain over time)  - can try smoothies made from fruit and almond/rice milk in am instead of regular breakfast - can also try old-fashioned (not instant) oatmeal made with almond/rice milk in am - order the dressing on the side when eating salad at a restaurant (pour less than half of the dressing on the salad) - eat as little meat as possible - can try juicing, but should not forget that juicing will get rid of the fiber, so would alternate with eating raw veg./fruits or drinking smoothies - use as little oil as possible, even when using olive oil - can dress a salad with a mix of balsamic vinegar and lemon juice, for e.g. - use agave nectar, stevia sugar, or regular sugar rather than artificial sweateners - steam or broil/roast veggies  - snack on veggies/fruit/nuts (unsalted, preferably) when possible, rather than processed foods - reduce or eliminate aspartame in diet (it is in diet sodas, chewing gum, etc) Read the labels!  Try to read Dr. Janene Harvey book: "Program for Reversing Diabetes" for other ideas for healthy eating.

## 2016-01-04 NOTE — Progress Notes (Signed)
Patient ID: Courtney Pena, female   DOB: 06-25-1940, 75 y.o.   MRN: [id number]   HPI: Courtney Pena is a 75 y.o.-year-old female, referred by Dr. Johnsie Cancel, for management of DM2, dx in 14-Jun-1998, insulin-dependent (but refusing insulin), uncontrolled, with complications (mild CKD, CAD, PAF).  Last hemoglobin A1c was: Lab Results  Component Value Date   HGBA1C 10.1 11/09/2015   HGBA1C 9..2 06/23/2015   HGBA1C 9.0 01/27/2015   Pt is on a regimen of: - Metformin 1000 mg 2x a day, with meals - Glipizide ER 20 mg in am - takes it in am, at 8:30 - does not eat then She refused insulins in the past. She was on GLP1 R agonists (Victoza), but stopped 2/2 cost (doughnut hole). She was on Januvia >> did not help. She was on Actos >> swelling.  Pt checks her sugars 2-3 a day and they are: - am: 347-390 - 2h after b'fast: n/c - before lunch: n/c - 2h after lunch: n/c - before dinner: 240s - 2h after dinner: n/c - bedtime: 175-200s - nighttime: n/c No lows. Lowest sugar was 92 - 2 weeks ago. Highest sugar was 409 - last 3 mo.  Glucometer: OneTouch Ultra  Pt's meals are: - Brunch (12 pm): sandwich or salad - Dinner: meat + veggies or salad + starch - Snacks: crackers, potato chips - pm or after dinner "I am a sugar addict" >> trying to cut the sweets out. On diet drinks.  - + h/o mild CKD, last BUN/creatinine: Lab Results  Component Value Date   BUN 26 (H) 12/22/2015   BUN 24 11/09/2015   CREATININE 1.07 (H) 12/22/2015   CREATININE 0.86 11/09/2015   - last set of lipids: Lab Results  Component Value Date   CHOL 242 (H) 01/27/2015   HDL 52 01/27/2015   LDLCALC 162 (H) 01/27/2015   LDLDIRECT 178 (H) 05/12/2015   TRIG 141 01/27/2015   CHOLHDL 4.7 01/27/2015  On Pravastatin. - last eye exam was in 07/06/2015. No DR.  - + numbness and tingling in her feet.  Pt has FH of DM in .  Husband passed away in 06/14/15.  She also has a history of HTN, HL,  hypothyroidism. Latest TSH was slightly elevated, after which her levothyroxine dose has been adjusted up.  ROS: Constitutional: no weight gain/loss, no fatigue, no subjective hyperthermia/hypothermia, + nocturia 2-3x a day Eyes: no blurry vision, no xerophthalmia ENT: no sore throat, no nodules palpated in throat, no dysphagia/odynophagia, no hoarseness, + tinnitus Cardiovascular: no CP/SOB/+ palpitations/no leg swelling Respiratory: no cough/SOB Gastrointestinal: no N/V/D/C Musculoskeletal: no muscle/+ joint aches Skin: no rashes Neurological: no tremors/numbness/tingling/dizziness Psychiatric: no depression/anxiety  Past Medical History:  Diagnosis Date  . Allergy   . Anemia due to GI blood loss 08/12/2011  . Arthritis   . Asthma   . Atrial flutter ([place]) 12-07-2010   converted in ED with 300 mg flecainide  . CAD (coronary artery disease)    a. mild per cath in 2002/06/14;  b. nonischemic Myoview in 2010/06/14;  c. Lex MV 1/14:  EF 66%, no ischemia  . Chronic anticoagulation - Xarelto started 07/07/2015, CHADS2CVASC=5 07/07/2015  . External hemorrhoids 06/07/2010  . Gastric antral vascular ectasia    source for gi bleed in 07/2011 - Xarelto stopped  . Gastritis   . GERD (gastroesophageal reflux disease)   . Hiatal hernia   . Hyperlipidemia   . Hypertension   . Hypothyroidism   . Neuromuscular disorder ([place])   .  Obesity   . Paroxysmal atrial fibrillation (HCC)   . Personal history of colonic polyps 06/06/2009   cecal polyp  . Sleep apnea    does not use CPAP  . Type 2 diabetes mellitus with diabetic chronic kidney disease ([place]) 04/25/2006      . Ulcer [facility])    Past Surgical History:  Procedure Laterality Date  . BREAST BIOPSY Left   . CARDIAC CATHETERIZATION  1999&2004  . CARPAL TUNNEL RELEASE Bilateral 2003  . CATARACT EXTRACTION Bilateral   . COLONOSCOPY    . ESOPHAGOGASTRODUODENOSCOPY  08/13/2011   Procedure: ESOPHAGOGASTRODUODENOSCOPY (EGD);  Surgeon: Gatha Mayer, MD;   Location: [facility] ENDOSCOPY;  Service: Endoscopy;  Laterality: N/A;  . POLYPECTOMY     Social History   Social History  . Marital status: Widowed    Spouse name: Iona Beard  . Number of children: 4  . Years of education: some colle   Occupational History  . Management Unemployed   Social History Main Topics  . Smoking status: Former Smoker    Packs/day: 2.00    Years: 30.00    Types: Cigarettes    Quit date: 02/26/1993  . Smokeless tobacco: Never Used  . Alcohol use No  . Drug use: No  . Sexual activity: Not Currently   Other Topics Concern  . Not on file   Social History Narrative  . No narrative on file   Current Outpatient Prescriptions on File Prior to Visit  Medication Sig Dispense Refill  . Cholecalciferol (VITAMIN D3) 1000 UNITS CAPS Take 1 capsule by mouth daily.     . clobetasol cream (TEMOVATE) AB-123456789 % Apply 1 application topically as directed.     . Colchicine 0.6 MG CAPS Take 1 tablet by mouth daily as needed (gout/pain). Reported on 09/02/2015    . diltiazem (CARDIZEM CD) 360 MG 24 hr capsule Take 1 capsule (360 mg total) by mouth daily. 30 capsule 3  . glipiZIDE (GLUCOTROL XL) 10 MG 24 hr tablet Take 2 tablets (20 mg total) by mouth daily with breakfast. 180 tablet 3  . hydrochlorothiazide (HYDRODIURIL) 12.5 MG tablet Take 1 tablet (12.5 mg total) by mouth daily. 90 tablet 3  . iron polysaccharides (NIFEREX) 150 MG capsule Take 150 mg by mouth daily.    Marland Kitchen levothyroxine (SYNTHROID, LEVOTHROID) 112 MCG tablet Take 1 tablet (112 mcg total) by mouth daily. 90 tablet 3  . meclizine (ANTIVERT) 25 MG tablet TAKE 1 TABLET (25 MG TOTAL) BY MOUTH 3 (THREE) TIMES DAILY AS NEEDED FOR DIZZINESS. 30 tablet 6  . metFORMIN (GLUCOPHAGE) 1000 MG tablet Take 1 tablet by mouth  twice a day with meals 180 tablet 3  . metoprolol succinate (TOPROL-XL) 100 MG 24 hr tablet TAKE 1 TABLET BY MOUTH  DAILY WITH OR IMMEDIATELY  FOLLOWING A MEAL 90 tablet 3  . nitroGLYCERIN (NITROSTAT) 0.4 MG SL tablet  Place 1 tablet (0.4 mg total) under the tongue every 5 (five) minutes as needed for chest pain. 25 tablet 1  . pravastatin (PRAVACHOL) 10 MG tablet Take 10 mg by mouth every other day. Reported on 07/26/2015    . rivaroxaban (XARELTO) 20 MG TABS tablet Take 1 tablet (20 mg total) by mouth daily with supper. 30 tablet 11  . traMADol (ULTRAM) 50 MG tablet Take 50 mg by mouth every 6 (six) hours as needed for moderate pain or severe pain.     No current facility-administered medications on file prior to visit.    Allergies  Allergen Reactions  .  Lisinopril Swelling    REACTION: swelling, may have had some breathing involvement.   . Nsaids Other (See Comments)    Patient reports internal bleeding  . Penicillins Shortness Of Breath and Swelling    Arm Swelling with Penicillin (Occurred in 1960s) Breathing - throat swelling with Amoxicillin (Occurred prior to 2002)  . Sulfamethoxazole Hives and Itching    "welps all over" immediately after dose  . Actos [Pioglitazone] Swelling    REACTION: swelling all over body  . Iodinated Diagnostic Agents Itching    Pt. Developed mild itching after receiving IV cm; pt. Held; Dr. Keane Scrape recomended she take 50 mg of benadryl when she goes home-if necessary; Dr Mickey Farber recommends benadryl prior to future exams requiring contrast media, but stated other doctors may recommend another premedication prep.  . Flecainide Other (See Comments)    Blurry  vision  . Gabapentin     Caused dysphora  . Losartan Potassium     swelling   Family History  Problem Relation Age of Onset  . Heart disease Father   . Diabetes Maternal Grandfather   . Hypertension Mother   . Other Mother     brain tumor-benign  . Diabetes Daughter     pre-diabeties  . Colon polyps Daughter   . Thyroid disease Daughter     x 2  . Colon cancer Neg Hx    PE: BP (!) 154/92 (BP Location: Left Arm, Patient Position: Sitting, Cuff Size: Large)   Pulse 91   Ht 5\' 5"  (1.651 m)   Wt 235  lb 9.6 oz (106.9 kg)   SpO2 96%   BMI 39.21 kg/m  Wt Readings from Last 3 Encounters:  01/04/16 235 lb 9.6 oz (106.9 kg)  12/22/15 236 lb 6.4 oz (107.2 kg)  11/24/15 231 lb 3.2 oz (104.9 kg)   Constitutional: obese, in NAD Eyes: PERRLA, EOMI, no exophthalmos ENT: moist mucous membranes, no thyromegaly, no cervical lymphadenopathy Cardiovascular: RRR, No MRG Respiratory: CTA B Gastrointestinal: abdomen soft, NT, ND, BS+ Musculoskeletal: no deformities, strength intact in all 4 Skin: moist, warm, no rashes Neurological: no tremor with outstretched hands, DTR normal in all 4  ASSESSMENT: 1. DM2, insulin-dependent, uncontrolled, with complications - mild CKD - CAD - PAF  PLAN:  1. Patient with long-standing, uncontrolled diabetes, on oral antidiabetic regimen, which became insufficient. At this visit, we reviewed her HbA1c levels, and the sugars per her recall. The sugars are very high, with 300s in a.m. I explained that they indicate a state of glucose toxicity, which can only be reversed with insulin. I suggested basal insulin and discussed about dosing, benefits of using insulin, and possible side effects. She understands the need for insulin, but would like to try other measures first. We can move all metformin at night to hopefully decrease sugars in the morning right before her first meal of the day. She also wants to change her diet, and I suggested a plant-based diet. Given references. She is to contact me in 2 weeks, and if the sugars are not better, she is open to start basal insulin. - I suggested to:  Patient Instructions  Please move all the Metformin at dinnertime (2000 mg daily).  Continue Glipizide ER 20 mg in am, before first meal of the day.  Please let me know if the sugars are consistently <80 or >200.  Call me in 2 weeks with your sugars.  Please return in 1.5 months with your sugar log.   - Continue  checking  sugars at different times of the day - check 2  times a day, rotating checks - given sugar log and advised how to fill it and to bring it at next appt  - given foot care handout and explained the principles  - given instructions for hypoglycemia management "15-15 rule"  - advised for yearly eye exams  - Return to clinic in 1.5 mo with sugar log   Philemon Kingdom, MD PhD [facility] Endocrinology

## 2016-02-03 ENCOUNTER — Ambulatory Visit (HOSPITAL_COMMUNITY)
Admission: RE | Admit: 2016-02-03 | Discharge: 2016-02-03 | Disposition: A | Discharge status: Home / Self Care | Payer: Medicare Other | Source: Ambulatory Visit | Attending: Nurse Practitioner | Admitting: Nurse Practitioner

## 2016-02-03 DIAGNOSIS — R9431 Abnormal electrocardiogram [ECG] [EKG]: Secondary | ICD-10-CM | POA: Diagnosis not present

## 2016-02-03 DIAGNOSIS — I4891 Unspecified atrial fibrillation: Secondary | ICD-10-CM | POA: Diagnosis present

## 2016-02-03 NOTE — Progress Notes (Addendum)
Pt walked in today c/o "fluttering, jumpy feeling" in her chest.  EKG was done today, to be reviewed by Roderic Palau, NP  EKG reviewed and showed SR with PAC's. Pt reassured, no change in therapy, has regular appointment in afib clinic on Monday, 1218.

## 2016-02-06 ENCOUNTER — Encounter (HOSPITAL_COMMUNITY): Payer: Self-pay | Admitting: Nurse Practitioner

## 2016-02-06 ENCOUNTER — Ambulatory Visit (HOSPITAL_COMMUNITY)
Admission: RE | Admit: 2016-02-06 | Discharge: 2016-02-06 | Disposition: A | Discharge status: Home / Self Care | Payer: Medicare Other | Source: Ambulatory Visit | Attending: Nurse Practitioner | Admitting: Nurse Practitioner

## 2016-02-06 VITALS — BP 152/88 | HR 73 | Ht 65.0 in | Wt 241.2 lb

## 2016-02-06 DIAGNOSIS — I480 Paroxysmal atrial fibrillation: Secondary | ICD-10-CM

## 2016-02-06 DIAGNOSIS — R002 Palpitations: Secondary | ICD-10-CM

## 2016-02-06 DIAGNOSIS — I4891 Unspecified atrial fibrillation: Secondary | ICD-10-CM | POA: Diagnosis not present

## 2016-02-06 DIAGNOSIS — Z0001 Encounter for general adult medical examination with abnormal findings: Secondary | ICD-10-CM | POA: Diagnosis present

## 2016-02-06 DIAGNOSIS — I129 Hypertensive chronic kidney disease with stage 1 through stage 4 chronic kidney disease, or unspecified chronic kidney disease: Secondary | ICD-10-CM | POA: Insufficient documentation

## 2016-02-06 MED ORDER — DILTIAZEM HCL 30 MG PO TABS
ORAL_TABLET | ORAL | 3 refills | Status: DC
Start: 2016-02-06 — End: 2016-08-03

## 2016-02-06 NOTE — Patient Instructions (Signed)
Your physician has recommended you make the following change in your medication:  1)Cardizem 30mg -- take 1 tablet every 4 hours AS NEEDED for AFIB heart rate >100 as long as blood pressure >100.   

## 2016-02-06 NOTE — Progress Notes (Signed)
Patient ID: Courtney Pena, female   DOB: [birth year], 75 y.o.   MRN: [id number]     Primary Care Physician: Zigmund Gottron, MD Referring Physician: Dr. Caryl Never Bielawski is a 75 y.o. 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 Johnsie Cancel and is here for f/u in the afib clinic. Other than a few intermittent flutters, she has not noticed any sustained afib. Tolerating flecainide well.Tolerating  Xarelto, no bleeding issue, but is in do-nut hole and will help fill out assistance forms today to see if she will qualify for assistance. She did have a f/u ETT, for which she did not stay on the treadmill for a minute but no EKG changes were noted with exertion.  Returns to afib clinic 8/17, for c/o blurred vision. She states that she has noted vision change since starting flecainide but thought it would wear off. She stopped flecainide yesterday and vision is already better. She is in SR today and since starting flecainide, afib burden has been low. She also stopped losartan because she retained fluid with previous use. [place] PCP.  Her PCP wanted her to try one more time to see if the S.E. occurred again and it did. Will f/u with PCP for BP.  F/u in afib clinc 12/11. She is off flecainide. She saw Dr. Rayann Heman in August and was given options for ablation or Rythmol. She decide that she would not make any changes. She feels that she has not noted the irregularity related with afib but has noted a different irregularity. She had pac's on her EKG the other day and what she describes fits this pattern.   Today, she denies symptoms of palpitations, chest pain, shortness of breath, orthopnea, PND, lower extremity edema, dizziness, presyncope, syncope, or neurologic sequela. The patient is tolerating medications without difficulties and is  otherwise without complaint today.   Past Medical History:  Diagnosis Date  . Allergy   . Anemia due to GI blood loss 08/12/2011  . Arthritis   . Asthma   . Atrial flutter ([place]) 12-07-2010   converted in ED with 300 mg flecainide  . CAD (coronary artery disease)    a. mild per cath in 2004;  b. nonischemic Myoview in March 2012;  c. Lex MV 1/14:  EF 66%, no ischemia  . Chronic anticoagulation - Xarelto started 07/07/2015, CHADS2CVASC=5 07/07/2015  . External hemorrhoids 06/07/2010  . Gastric antral vascular ectasia    source for gi bleed in 07/2011 - Xarelto stopped  . Gastritis   . GERD (gastroesophageal reflux disease)   . Hiatal hernia   . Hyperlipidemia   . Hypertension   . Hypothyroidism   . Neuromuscular disorder ([place])   . Obesity   . Paroxysmal atrial fibrillation (HCC)   . Personal history of colonic polyps 06/06/2009   cecal polyp  . Sleep apnea    does not use CPAP  . Type 2 diabetes mellitus with diabetic chronic kidney disease ([place]) 04/25/2006      . Ulcer [facility])    Past Surgical History:  Procedure Laterality Date  . BREAST BIOPSY Left   . CARDIAC CATHETERIZATION  1999&2004  . CARPAL TUNNEL RELEASE Bilateral 2003  . CATARACT EXTRACTION Bilateral   . COLONOSCOPY    . ESOPHAGOGASTRODUODENOSCOPY  08/13/2011   Procedure: ESOPHAGOGASTRODUODENOSCOPY (EGD);  Surgeon: Gatha Mayer, MD;  Location: [place];  Service:  Endoscopy;  Laterality: N/A;  . POLYPECTOMY      Current Outpatient Prescriptions  Medication Sig Dispense Refill  . Cholecalciferol (VITAMIN D3) 1000 UNITS CAPS Take 1 capsule by mouth daily.     . clobetasol cream (TEMOVATE) [id number] % Apply 1 application topically as directed.     . Colchicine 0.6 MG CAPS Take 1 tablet by mouth daily as needed (gout/pain). Reported on 09/02/2015    . diltiazem (CARDIZEM CD) 360 MG 24 hr capsule Take 1 capsule (360 mg total) by mouth daily. 30 capsule 3  . glipiZIDE (GLUCOTROL XL) 10 MG 24 hr tablet Take 2 tablets (20 mg  total) by mouth daily with breakfast. 180 tablet 3  . hydrochlorothiazide (HYDRODIURIL) 12.5 MG tablet Take 1 tablet (12.5 mg total) by mouth daily. 90 tablet 3  . iron polysaccharides (NIFEREX) 150 MG capsule Take 150 mg by mouth daily.    Marland Kitchen levothyroxine (SYNTHROID, LEVOTHROID) 112 MCG tablet Take 1 tablet (112 mcg total) by mouth daily. 90 tablet 3  . metFORMIN (GLUCOPHAGE) 1000 MG tablet Take 1 tablet by mouth  twice a day with meals 180 tablet 3  . metoprolol succinate (TOPROL-XL) 100 MG 24 hr tablet TAKE 1 TABLET BY MOUTH  DAILY WITH OR IMMEDIATELY  FOLLOWING A MEAL 90 tablet 3  . nitroGLYCERIN (NITROSTAT) 0.4 MG SL tablet Place 1 tablet (0.4 mg total) under the tongue every 5 (five) minutes as needed for chest pain. 25 tablet 1  . ONE TOUCH ULTRA TEST test strip     . pravastatin (PRAVACHOL) 10 MG tablet Take 10 mg by mouth every other day. Reported on 07/26/2015    . rivaroxaban (XARELTO) 20 MG TABS tablet Take 1 tablet (20 mg total) by mouth daily with supper. 30 tablet 11  . traMADol (ULTRAM) 50 MG tablet Take 50 mg by mouth every 6 (six) hours as needed for moderate pain or severe pain.    Marland Kitchen diltiazem (CARDIZEM) 30 MG tablet Take 1 tablet every 4 hours AS NEEDED for heart rate >100 as long as blood pressure >100. 45 tablet 3   No current facility-administered medications for this encounter.     Allergies  Allergen Reactions  . Lisinopril Swelling    REACTION: swelling, may have had some breathing involvement.   . Nsaids Other (See Comments)    Patient reports internal bleeding  . Penicillins Shortness Of Breath and Swelling    Arm Swelling with Penicillin (Occurred in 1960s) Breathing - throat swelling with Amoxicillin (Occurred prior to 2002)  . Sulfamethoxazole Hives and Itching    "welps all over" immediately after dose  . Actos [Pioglitazone] Swelling    REACTION: swelling all over body  . Iodinated Diagnostic Agents Itching    Pt. Developed mild itching after receiving IV  cm; pt. Held; Dr. Keane Scrape recomended she take 50 mg of benadryl when she goes home-if necessary; Dr Mickey Farber recommends benadryl prior to future exams requiring contrast media, but stated other doctors may recommend another premedication prep.  . Flecainide Other (See Comments)    Blurry  vision  . Gabapentin     Caused dysphora  . Losartan Potassium     swelling    Social History   Social History  . Marital status: Widowed    Spouse name: Iona Beard  . Number of children: 4  . Years of education: some colle   Occupational History  . Management Unemployed   Social History Main Topics  . Smoking status: Former Smoker  Packs/day: 2.00    Years: 30.00    Types: Cigarettes    Quit date: 02/26/1993  . Smokeless tobacco: Never Used  . Alcohol use No  . Drug use: No  . Sexual activity: Not Currently   Other Topics Concern  . Not on file   Social History Narrative  . No narrative on file    Family History  Problem Relation Age of Onset  . Heart disease Father   . Diabetes Maternal Grandfather   . Hypertension Mother   . Other Mother     brain tumor-benign  . Diabetes Daughter     pre-diabeties  . Colon polyps Daughter   . Thyroid disease Daughter     x 2  . Colon cancer Neg Hx     ROS- All systems are reviewed and negative except as per the HPI above  Physical Exam: Vitals:   02/06/16 1517  BP: (!) 152/88  Pulse: 73  Weight: 241 lb 3.2 oz (109.4 kg)  Height: 5\' 5"  (1.651 m)    GEN- The patient is well appearing, alert and oriented x 3 today.   Head- normocephalic, atraumatic Eyes-  Sclera clear, conjunctiva pink Ears- hearing intact Oropharynx- clear Neck- supple, no JVP Lymph- no cervical lymphadenopathy Lungs- Clear to ausculation bilaterally, normal work of breathing Heart- Regular rate and rhythm, no murmurs, rubs or gallops, PMI not laterally displaced GI- soft, NT, ND, + BS Extremities- no clubbing, cyanosis, or edema MS- no significant deformity  or atrophy Skin- no rash or lesion Psych- euthymic mood, full affect Neuro- strength and sensation are intact  EKG-SR with first degree AVB, pr int 216 ms, qrs int 86 ms, qtc 449 ms Epic records reviewed  ETT-The patient walked for 54 seconds of a standard Bruce protocol GXt.  she stopped due to dysnea. She achieved a peak HR of 86 which is 59% predicted maximal HR .  There was no QRS widening with this minimal exertion There were no ST or T wave changes to suggest ischemia  Assessment and Plan: 1. AFIb Staying in SR off flecainide for S.E. of blurred vision Discussed options of  Rythmol or ablation as offered by Dr. Rayann Heman but she is doing OK at present Continue xarelto  Continue dilt/BB, for palpitations, or breakthrough afib will rx cardizem 30 mg as needed Reassured palpitations benign  2. HTN Not optimal Avoid salt Weight loss F/u with PCP  F/u in 3 months afib clinic  Butch Penny C. Shravya Wickwire, [facility] [address] [place], [place] [address] [phone]

## 2016-02-20 ENCOUNTER — Other Ambulatory Visit: Payer: Self-pay | Admitting: Family Medicine

## 2016-02-21 ENCOUNTER — Ambulatory Visit (INDEPENDENT_AMBULATORY_CARE_PROVIDER_SITE_OTHER): Payer: Medicare Other | Admitting: Internal Medicine

## 2016-02-21 ENCOUNTER — Encounter: Payer: Self-pay | Admitting: Internal Medicine

## 2016-02-21 VITALS — BP 160/88 | HR 67 | Wt 237.0 lb

## 2016-02-21 DIAGNOSIS — E1122 Type 2 diabetes mellitus with diabetic chronic kidney disease: Secondary | ICD-10-CM

## 2016-02-21 LAB — POCT GLYCOSYLATED HEMOGLOBIN (HGB A1C): Hemoglobin A1C: 10.4

## 2016-02-21 MED ORDER — METFORMIN HCL ER 500 MG PO TB24: 2000.0000 mg | ORAL_TABLET | Freq: Every day | ORAL | 3 refills | Status: DC

## 2016-02-21 MED ORDER — INSULIN PEN NEEDLE 32G X 4 MM MISC: 3 refills | Status: DC

## 2016-02-21 MED ORDER — GLIPIZIDE ER 10 MG PO TB24: 10.0000 mg | ORAL_TABLET | Freq: Every day | ORAL | 3 refills | Status: DC

## 2016-02-21 MED ORDER — INSULIN GLARGINE 100 UNIT/ML SOLOSTAR PEN: 14.0000 [IU] | PEN_INJECTOR | Freq: Every day | SUBCUTANEOUS | 5 refills | Status: DC

## 2016-02-21 NOTE — Progress Notes (Signed)
Patient ID: Courtney Pena, female   DOB: Jul 09, 1940, 75 y.o.   MRN: [id number]   HPI: Courtney Pena is a 75 y.o.-year-old female, initially referred by Dr. Johnsie Cancel, returning for follow-up for DM2, dx in ~2000, insulin-dependent (but refusing insulin), uncontrolled, with complications (mild CKD, CAD, PAF). Last visit 1.5 months ago.  Last hemoglobin A1c was: Lab Results  Component Value Date   HGBA1C 10.4 02/21/2016   HGBA1C 10.1 11/09/2015   HGBA1C 9..2 06/23/2015   HGBA1C 9.0 01/27/2015   HGBA1C 9.4 07/09/2014   HGBA1C 9.7 02/12/2014   HGBA1C 9.4 09/18/2013   HGBA1C 9.8 05/27/2013   HGBA1C 8.4 12/26/2012   HGBA1C 9.4 08/22/2012   HGBA1C 9.9 05/14/2012   HGBA1C 10.6 12/26/2011   HGBA1C 8.0 09/05/2011   HGBA1C 8.2 06/15/2011   HGBA1C 8.9 02/02/2011   HGBA1C 8.6 (H) 12/07/2010   HGBA1C 8.5 11/22/2010   HGBA1C 9.4 (H) 05/02/2010   HGBA1C 8.5 04/28/2010   HGBA1C 7.5 07/27/2009   Pt is on a regimen of: - Metformin 1000 mg 2x a day, with meals - Glipizide ER 20 mg in am She refused insulins in the past. She was on GLP1 R agonists (Victoza), but stopped 2/2 cost (doughnut hole). She was on this up until Jun 07, 2015. She was on Januvia >> did not help. She was on Actos >> swelling.  Pt checks her sugars 2-3 a day and they are (no log): - am: 347-390 >> 250-375 - 2h after b'fast: n/c - before lunch: n/c - 2h after lunch: n/c - before dinner: 240s >> 90-113 - 2h after dinner: n/c - bedtime: 175-200s >> 190-200s - nighttime: n/c No lows. Lowest sugar was 92 >> 90 Highest sugar was 409 >> 395.  Glucometer: OneTouch Ultra  Pt's meals are: - Brunch (12 pm): sandwich or salad - Dinner: meat + veggies or salad + starch - Snacks: crackers, potato chips - pm or after dinner "I am a sugar addict" >> trying to cut the sweets out. On diet drinks.  - + h/o mild CKD, last BUN/creatinine: Lab Results  Component Value Date   BUN 26 (H) 12/22/2015   BUN 24 11/09/2015    CREATININE 1.07 (H) 12/22/2015   CREATININE 0.86 11/09/2015   - last set of lipids: Lab Results  Component Value Date   CHOL 242 (H) 01/27/2015   HDL 52 01/27/2015   LDLCALC 162 (H) 01/27/2015   LDLDIRECT 178 (H) 05/12/2015   TRIG 141 01/27/2015   CHOLHDL 4.7 01/27/2015  On Pravastatin. - last eye exam was in 07/06/2015. No DR.  - + numbness and tingling in her feet.  Husband passed away in 2015-06-07.  She also has a history of HTN, HL, hypothyroidism. Latest TSH was slightly elevated, after which her levothyroxine dose has been adjusted up.  ROS: Constitutional: + weight gain, no fatigue, no subjective hyperthermia/hypothermia Eyes: no blurry vision, no xerophthalmia ENT: no sore throat, no nodules palpated in throat, no dysphagia/odynophagia, no hoarseness Cardiovascular: no CP/SOB/palpitations/no leg swelling Respiratory: no cough/SOB Gastrointestinal: no N/V/D/C Musculoskeletal: + muscle/+ joint aches Skin: no rashes Neurological: no tremors/numbness/tingling/dizziness  I reviewed pt's medications, allergies, PMH, social hx, family hx, and changes were documented in the history of present illness. Otherwise, unchanged from my initial visit note:  Past Medical History:  Diagnosis Date  . Allergy   . Anemia due to GI blood loss 08/12/2011  . Arthritis   . Asthma   . Atrial flutter ([place]) 12-07-2010   converted in ED with 300  mg flecainide  . CAD (coronary artery disease)    a. mild per cath in 2004;  b. nonischemic Myoview in March 2012;  c. Lex MV 1/14:  EF 66%, no ischemia  . Chronic anticoagulation - Xarelto started 07/07/2015, CHADS2CVASC=5 07/07/2015  . External hemorrhoids 06/07/2010  . Gastric antral vascular ectasia    source for gi bleed in 07/2011 - Xarelto stopped  . Gastritis   . GERD (gastroesophageal reflux disease)   . Hiatal hernia   . Hyperlipidemia   . Hypertension   . Hypothyroidism   . Neuromuscular disorder ([place])   . Obesity   . Paroxysmal  atrial fibrillation (HCC)   . Personal history of colonic polyps 06/06/2009   cecal polyp  . Sleep apnea    does not use CPAP  . Type 2 diabetes mellitus with diabetic chronic kidney disease ([place]) 04/25/2006      . Ulcer [facility])    Past Surgical History:  Procedure Laterality Date  . BREAST BIOPSY Left   . CARDIAC CATHETERIZATION  1999&2004  . CARPAL TUNNEL RELEASE Bilateral 2003  . CATARACT EXTRACTION Bilateral   . COLONOSCOPY    . ESOPHAGOGASTRODUODENOSCOPY  08/13/2011   Procedure: ESOPHAGOGASTRODUODENOSCOPY (EGD);  Surgeon: Gatha Mayer, MD;  Location: [facility] ENDOSCOPY;  Service: Endoscopy;  Laterality: N/A;  . POLYPECTOMY     Social History   Social History  . Marital status: Widowed    Spouse name: Iona Beard  . Number of children: 4  . Years of education: some colle   Occupational History  . Management Unemployed   Social History Main Topics  . Smoking status: Former Smoker    Packs/day: 2.00    Years: 30.00    Types: Cigarettes    Quit date: 02/26/1993  . Smokeless tobacco: Never Used  . Alcohol use No  . Drug use: No  . Sexual activity: Not Currently   Other Topics Concern  . Not on file   Social History Narrative  . No narrative on file   Current Outpatient Prescriptions on File Prior to Visit  Medication Sig Dispense Refill  . Cholecalciferol (VITAMIN D3) 1000 UNITS CAPS Take 1 capsule by mouth daily.     . clobetasol cream (TEMOVATE) [id number] % Apply 1 application topically as directed.     . Colchicine 0.6 MG CAPS Take 1 tablet by mouth daily as needed (gout/pain). Reported on 09/02/2015    . diltiazem (CARDIZEM CD) 360 MG 24 hr capsule Take 1 capsule (360 mg total) by mouth daily. 30 capsule 3  . diltiazem (CARDIZEM) 30 MG tablet Take 1 tablet every 4 hours AS NEEDED for heart rate >100 as long as blood pressure >100. 45 tablet 3  . glipiZIDE (GLUCOTROL XL) 10 MG 24 hr tablet Take 2 tablets (20 mg total) by mouth daily with breakfast. 180 tablet 3  .  hydrochlorothiazide (HYDRODIURIL) 12.5 MG tablet Take 1 tablet (12.5 mg total) by mouth daily. 90 tablet 3  . iron polysaccharides (NIFEREX) 150 MG capsule Take 150 mg by mouth daily.    Marland Kitchen levothyroxine (SYNTHROID, LEVOTHROID) 112 MCG tablet Take 1 tablet (112 mcg total) by mouth daily. 90 tablet 3  . metFORMIN (GLUCOPHAGE) 1000 MG tablet Take 1 tablet by mouth  twice a day with meals 180 tablet 3  . metoprolol succinate (TOPROL-XL) 100 MG 24 hr tablet TAKE 1 TABLET BY MOUTH  DAILY WITH OR IMMEDIATELY  FOLLOWING A MEAL 90 tablet 3  . nitroGLYCERIN (NITROSTAT) 0.4 MG SL tablet Place 1  tablet (0.4 mg total) under the tongue every 5 (five) minutes as needed for chest pain. 25 tablet 1  . ONE TOUCH ULTRA TEST test strip     . pravastatin (PRAVACHOL) 10 MG tablet Take 10 mg by mouth every other day. Reported on 07/26/2015    . rivaroxaban (XARELTO) 20 MG TABS tablet Take 1 tablet (20 mg total) by mouth daily with supper. 30 tablet 11  . traMADol (ULTRAM) 50 MG tablet Take 50 mg by mouth every 6 (six) hours as needed for moderate pain or severe pain.     No current facility-administered medications on file prior to visit.    Allergies  Allergen Reactions  . Lisinopril Swelling    REACTION: swelling, may have had some breathing involvement.   . Nsaids Other (See Comments)    Patient reports internal bleeding  . Penicillins Shortness Of Breath and Swelling    Arm Swelling with Penicillin (Occurred in 1960s) Breathing - throat swelling with Amoxicillin (Occurred prior to 2002)  . Sulfamethoxazole Hives and Itching    "welps all over" immediately after dose  . Actos [Pioglitazone] Swelling    REACTION: swelling all over body  . Iodinated Diagnostic Agents Itching    Pt. Developed mild itching after receiving IV cm; pt. Held; Dr. Keane Scrape recomended she take 50 mg of benadryl when she goes home-if necessary; Dr Mickey Farber recommends benadryl prior to future exams requiring contrast media, but stated  other doctors may recommend another premedication prep.  . Flecainide Other (See Comments)    Blurry  vision  . Gabapentin     Caused dysphora  . Losartan Potassium     swelling   Family History  Problem Relation Age of Onset  . Heart disease Father   . Diabetes Maternal Grandfather   . Hypertension Mother   . Other Mother     brain tumor-benign  . Diabetes Daughter     pre-diabeties  . Colon polyps Daughter   . Thyroid disease Daughter     x 2  . Colon cancer Neg Hx    PE: BP (!) 160/88   Pulse 67   Wt 237 lb (107.5 kg)   BMI 39.44 kg/m  Wt Readings from Last 3 Encounters:  02/21/16 237 lb (107.5 kg)  02/06/16 241 lb 3.2 oz (109.4 kg)  01/04/16 235 lb 9.6 oz (106.9 kg)   Constitutional: obese, in NAD Eyes: PERRLA, EOMI, no exophthalmos ENT: moist mucous membranes, no thyromegaly, no cervical lymphadenopathy Cardiovascular: RRR, No MRG Respiratory: CTA B Gastrointestinal: abdomen soft, NT, ND, BS+ Musculoskeletal: no deformities, strength intact in all 4 Skin: moist, warm, no rashes Neurological: no tremor with outstretched hands, DTR normal in all 4  ASSESSMENT: 1. DM2, insulin-dependent, uncontrolled, with complications - mild CKD - CAD - PAF  PLAN:  1. Patient with long-standing, uncontrolled diabetes, on oral antidiabetic regimen. At last visit, sugars are very high,300s in a.m. I explained that they indicate a state of glucose toxicity, which can only be reversed with insulin, but she refused insulin. She understood the need for insulin, but wanted to try other measures first. We then moved all metformin at night to help decrease sugars in the morning. I also suggested a plant-based diet.  - at this visit, her sugars are still high and HbA1c today 10.4% (higher) - she accepts to start insulin. Will decrease Glipizide as we are adding insulin. - would like to try back Victoza. Will hold off this for now as her am sugars  are the highest and these will not be  influenced much by Victoza - I suggested to:  Patient Instructions  Please start Lantus 14 units at bedtime. Increase by 3 units every 3 days until sugars in am <150.  Decrease: - Glipizide ER to 10 mg daily in am.  Change: - Metformin to ER form and take 2000 units at dinnertime.  Please return in 1.5 months with your sugar log.   - Continue  checking sugars at different times of the day - check 2 times a day, rotating checks - advised for yearly eye exams >> she is UTD - Return to clinic in 1.5 mo with sugar log   Philemon Kingdom, MD PhD [facility] Endocrinology

## 2016-02-21 NOTE — Patient Instructions (Signed)
Please start Lantus 14 units at bedtime. Increase by 3 units every 3 days until sugars in am <150.  Decrease: - Glipizide ER to 10 mg daily in am.  Change: - Metformin to ER form and take 2000 units at dinnertime.  Please return in 1.5 months with your sugar log.

## 2016-03-05 ENCOUNTER — Other Ambulatory Visit (HOSPITAL_COMMUNITY): Payer: Self-pay | Admitting: *Deleted

## 2016-03-05 MED ORDER — RIVAROXABAN 20 MG PO TABS: 20.0000 mg | ORAL_TABLET | Freq: Every day | ORAL | 3 refills | Status: DC

## 2016-04-12 ENCOUNTER — Ambulatory Visit (INDEPENDENT_AMBULATORY_CARE_PROVIDER_SITE_OTHER): Payer: Medicare Other | Admitting: Internal Medicine

## 2016-04-12 ENCOUNTER — Encounter: Payer: Self-pay | Admitting: Internal Medicine

## 2016-04-12 ENCOUNTER — Ambulatory Visit (HOSPITAL_COMMUNITY)
Admission: RE | Admit: 2016-04-12 | Discharge: 2016-04-12 | Disposition: A | Discharge status: Home / Self Care | Payer: Medicare Other | Source: Ambulatory Visit | Attending: Nurse Practitioner | Admitting: Nurse Practitioner

## 2016-04-12 VITALS — BP 142/84 | HR 68 | Wt 235.0 lb

## 2016-04-12 VITALS — BP 142/78 | HR 75 | Ht 65.0 in | Wt 234.4 lb

## 2016-04-12 DIAGNOSIS — Z5189 Encounter for other specified aftercare: Secondary | ICD-10-CM | POA: Diagnosis not present

## 2016-04-12 DIAGNOSIS — I4891 Unspecified atrial fibrillation: Secondary | ICD-10-CM | POA: Diagnosis not present

## 2016-04-12 DIAGNOSIS — Z880 Allergy status to penicillin: Secondary | ICD-10-CM | POA: Insufficient documentation

## 2016-04-12 DIAGNOSIS — R002 Palpitations: Secondary | ICD-10-CM | POA: Diagnosis not present

## 2016-04-12 DIAGNOSIS — N189 Chronic kidney disease, unspecified: Secondary | ICD-10-CM | POA: Diagnosis not present

## 2016-04-12 DIAGNOSIS — Z794 Long term (current) use of insulin: Secondary | ICD-10-CM | POA: Diagnosis not present

## 2016-04-12 DIAGNOSIS — H538 Other visual disturbances: Secondary | ICD-10-CM | POA: Diagnosis not present

## 2016-04-12 DIAGNOSIS — Z79899 Other long term (current) drug therapy: Secondary | ICD-10-CM | POA: Insufficient documentation

## 2016-04-12 DIAGNOSIS — Z9889 Other specified postprocedural states: Secondary | ICD-10-CM | POA: Diagnosis not present

## 2016-04-12 DIAGNOSIS — R634 Abnormal weight loss: Secondary | ICD-10-CM | POA: Diagnosis not present

## 2016-04-12 DIAGNOSIS — Z87891 Personal history of nicotine dependence: Secondary | ICD-10-CM | POA: Insufficient documentation

## 2016-04-12 DIAGNOSIS — I129 Hypertensive chronic kidney disease with stage 1 through stage 4 chronic kidney disease, or unspecified chronic kidney disease: Secondary | ICD-10-CM | POA: Insufficient documentation

## 2016-04-12 DIAGNOSIS — E1122 Type 2 diabetes mellitus with diabetic chronic kidney disease: Secondary | ICD-10-CM | POA: Insufficient documentation

## 2016-04-12 DIAGNOSIS — Z7901 Long term (current) use of anticoagulants: Secondary | ICD-10-CM | POA: Insufficient documentation

## 2016-04-12 MED ORDER — DULAGLUTIDE 0.75 MG/0.5ML ~~LOC~~ SOAJ: SUBCUTANEOUS | 1 refills | Status: DC

## 2016-04-12 NOTE — Progress Notes (Signed)
Patient ID: Courtney Pena, female   DOB: 12/11/1940, 76 y.o.   MRN: [id number]     Primary Care Physician: Zigmund Gottron, MD Referring Physician: Dr. Caryl Never Spiers is a 76 y.o. 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 Johnsie Cancel and being seen 7/7 in afib clinic.Marland Kitchen  Other than a few intermittent flutters, she has not noticed any sustained afib. Tolerating flecainide well.Tolerating  Xarelto, no bleeding issue, but is in do-nut hole and will help fill out assistance forms today to see if she will qualify for assistance. She did have a f/u ETT, for which she did not stay on the treadmill for a minute but no EKG changes were noted with exertion.  Returns to afib clinic 8/17, for c/o blurred vision. She states that she has noted vision change since starting flecainide but thought it would wear off. She stopped flecainide yesterday and vision is already better. She is in SR today and since starting flecainide, afib burden has been low. She also stopped losartan because she retained fluid with previous use.   Her PCP wanted her to try one more time to see if the S.E. occurred again and it did. Will f/u with PCP for BP.  F/u in afib clinc 12/11. She is off flecainide. She saw Dr. Rayann Heman in August and was given options for ablation or Rythmol. She decide that she would not make any changes. She feels that she has not noted the irregularity related with afib but has noted a different irregularity. She had pac's on her EKG the other day and what she describes fits this pattern.   She asked to be seen in afib clinic today with a few concerns. She describes seconds to minutes of a sensation that will start in her stomach  and spread to her chest, feels a flutter and a pin prick. This may happen 2x a day. Usually at rest. She wanted  to report these symptoms but does not feel they are bad enough to change her approach. She had a stress test in 2017 which was low risk.  Today, she denies symptoms of palpitations, chest pain, shortness of breath, orthopnea, PND, lower extremity edema, dizziness, presyncope, syncope, or neurologic sequela. The patient is tolerating medications without difficulties and is otherwise without complaint today.   Past Medical History:  Diagnosis Date  . Allergy   . Anemia due to GI blood loss 08/12/2011  . Arthritis   . Asthma   . Atrial flutter ([place]) 12-07-2010   converted in ED with 300 mg flecainide  . CAD (coronary artery disease)    a. mild per cath in 2004;  b. nonischemic Myoview in March 2012;  c. Lex MV 1/14:  EF 66%, no ischemia  . Chronic anticoagulation - Xarelto started 07/07/2015, CHADS2CVASC=5 07/07/2015  . External hemorrhoids 06/07/2010  . Gastric antral vascular ectasia    source for gi bleed in 07/2011 - Xarelto stopped  . Gastritis   . GERD (gastroesophageal reflux disease)   . Hiatal hernia   . Hyperlipidemia   . Hypertension   . Hypothyroidism   . Neuromuscular disorder ([place])   . Obesity   . Paroxysmal atrial fibrillation (HCC)   . Personal history of colonic polyps 06/06/2009   cecal polyp  . Sleep apnea    does not use CPAP  . Type 2 diabetes mellitus  with diabetic chronic kidney disease ([place]) 04/25/2006      . Ulcer [facility])    Past Surgical History:  Procedure Laterality Date  . BREAST BIOPSY Left   . CARDIAC CATHETERIZATION  1999&2004  . CARPAL TUNNEL RELEASE Bilateral 2003  . CATARACT EXTRACTION Bilateral   . COLONOSCOPY    . ESOPHAGOGASTRODUODENOSCOPY  08/13/2011   Procedure: ESOPHAGOGASTRODUODENOSCOPY (EGD);  Surgeon: Gatha Mayer, MD;  [facility] ENDOSCOPY;  Service: Endoscopy;  Laterality: N/A;  . POLYPECTOMY      Current Outpatient Prescriptions  Medication Sig Dispense Refill  . Cholecalciferol (VITAMIN D3) 1000 UNITS CAPS Take 1 capsule by  mouth daily.     . clobetasol cream (TEMOVATE) [id number] % Apply 1 application topically as directed.     . Colchicine 0.6 MG CAPS Take 1 tablet by mouth daily as needed (gout/pain). Reported on 09/02/2015    . diltiazem (CARDIZEM CD) 360 MG 24 hr capsule Take 1 capsule (360 mg total) by mouth daily. 30 capsule 3  . diltiazem (CARDIZEM) 30 MG tablet Take 1 tablet every 4 hours AS NEEDED for heart rate >100 as long as blood pressure >100. 45 tablet 3  . glipiZIDE (GLUCOTROL XL) 10 MG 24 hr tablet Take 1 tablet (10 mg total) by mouth daily with breakfast. (Patient taking differently: Take 10 mg by mouth 2 (two) times daily. ) 180 tablet 3  . hydrochlorothiazide (HYDRODIURIL) 12.5 MG tablet Take 1 tablet (12.5 mg total) by mouth daily. 90 tablet 3  . levothyroxine (SYNTHROID, LEVOTHROID) 112 MCG tablet Take 1 tablet (112 mcg total) by mouth daily. 90 tablet 3  . metFORMIN (GLUCOPHAGE-XR) 500 MG 24 hr tablet Take 4 tablets (2,000 mg total) by mouth daily with breakfast. 360 tablet 3  . metoprolol succinate (TOPROL-XL) 100 MG 24 hr tablet TAKE 1 TABLET BY MOUTH  DAILY WITH OR IMMEDIATELY  FOLLOWING A MEAL 90 tablet 3  . rivaroxaban (XARELTO) 20 MG TABS tablet Take 1 tablet (20 mg total) by mouth daily with supper. 90 tablet 3  . Insulin Pen Needle 32G X 4 MM MISC Use 1x a day 100 each 3  . nitroGLYCERIN (NITROSTAT) 0.4 MG SL tablet Place 1 tablet (0.4 mg total) under the tongue every 5 (five) minutes as needed for chest pain. 25 tablet 1  . ONE TOUCH ULTRA TEST test strip     . traMADol (ULTRAM) 50 MG tablet Take 50 mg by mouth every 6 (six) hours as needed for moderate pain or severe pain.     No current facility-administered medications for this encounter.     Allergies  Allergen Reactions  . Lisinopril Swelling    REACTION: swelling, may have had some breathing involvement.   . Nsaids Other (See Comments)    Patient reports internal bleeding  . Penicillins Shortness Of Breath and Swelling    Arm  Swelling with Penicillin (Occurred in 1960s) Breathing - throat swelling with Amoxicillin (Occurred prior to 2002)  . Sulfamethoxazole Hives and Itching    "welps all over" immediately after dose  . Actos [Pioglitazone] Swelling    REACTION: swelling all over body  . Iodinated Diagnostic Agents Itching    Pt. Developed mild itching after receiving IV cm; pt. Held; Dr. Keane Scrape recomended she take 50 mg of benadryl when she goes home-if necessary; Dr Mickey Farber recommends benadryl prior to future exams requiring contrast media, but stated other doctors may recommend another premedication prep.  . Flecainide Other (See Comments)    Blurry  vision  .  Gabapentin     Caused dysphora  . Losartan Potassium     swelling    Social History   Social History  . Marital status: Widowed    Spouse name: Iona Beard  . Number of children: 4  . Years of education: some colle   Occupational History  . Management Unemployed   Social History Main Topics  . Smoking status: Former Smoker    Packs/day: 2.00    Years: 30.00    Types: Cigarettes    Quit date: 02/26/1993  . Smokeless tobacco: Never Used  . Alcohol use No  . Drug use: No  . Sexual activity: Not Currently   Other Topics Concern  . Not on file   Social History Narrative  . No narrative on file    Family History  Problem Relation Age of Onset  . Heart disease Father   . Diabetes Maternal Grandfather   . Hypertension Mother   . Other Mother     brain tumor-benign  . Diabetes Daughter     pre-diabeties  . Colon polyps Daughter   . Thyroid disease Daughter     x 2  . Colon cancer Neg Hx     ROS- All systems are reviewed and negative except as per the HPI above  Physical Exam: Vitals:   04/12/16 0850  BP: (!) 142/78  Pulse: 75  Weight: 234 lb 6.4 oz (106.3 kg)  Height: 5\' 5"  (1.651 m)    GEN- The patient is well appearing, alert and oriented x 3 today.   Head- normocephalic, atraumatic Eyes-  Sclera clear, conjunctiva  pink Ears- hearing intact Oropharynx- clear Neck- supple, no JVP Lymph- no cervical lymphadenopathy Lungs- Clear to ausculation bilaterally, normal work of breathing Heart- Regular rate and rhythm, no murmurs, rubs or gallops, PMI not laterally displaced GI- soft, NT, ND, + BS Extremities- no clubbing, cyanosis, or edema MS- no significant deformity or atrophy Skin- no rash or lesion Psych- euthymic mood, full affect Neuro- strength and sensation are intact  EKG-SR with first degree AVB, pr int 212 ms, qrs int 88 ms, qtc 444 ms Epic records reviewed  Stress myoview 2/8-Nuclear stress EF: 67%. Mild hypokinesis of basal septum.  There was no ST segment deviation noted during stress.  Defect 1: There is a small defect of mild severity present in the apex location. Consistent with breast attenuation. No ischemia. This is a low risk study. Sensitivity and specificity reduced secondary to body habitus, bowel loop attenuation.   Assessment and Plan: 1. AFIb Staying in SR, off flecainide for S.E. of blurred vision Describes a few flutterings a day associated with pin prick sensation Discussed options of  Rythmol or ablation as offered by Dr. Rayann Heman but she is doing OK at present Continue xarelto  Continue dilt/BB, for palpitations, or breakthrough afib will rx cardizem 30 mg as needed Reassured re palpitations  2. HTN Stable  Avoid salt Weight loss F/u with PCP  F/u in March with Dr. Mingo Amber Afib clinic as needed  Geroge Baseman. Izaias Krupka, [facility] [address] [place], [place] [address] [phone]

## 2016-04-12 NOTE — Progress Notes (Signed)
Patient ID: Courtney Pena, female   DOB: 10/17/1940, 76 y.o.   MRN: [id number]   HPI: Courtney Pena is a 76 y.o.-year-old female, initially referred by Dr. Johnsie Cancel, returning for follow-up for DM2, dx in ~2000, insulin-dependent (but refusing insulin), uncontrolled, with complications (mild CKD, CAD, PAF). Last visit 1.5 months ago.  Since last visit, she started a vegan diet >> sugars are much better!  She had a steroid shot yesterday >> sugars higher.  Last hemoglobin A1c was: Lab Results  Component Value Date   HGBA1C 10.4 02/21/2016   HGBA1C 10.1 11/09/2015   HGBA1C 9..2 06/23/2015   HGBA1C 9.0 01/27/2015   HGBA1C 9.4 07/09/2014   HGBA1C 9.7 02/12/2014   HGBA1C 9.4 09/18/2013   HGBA1C 9.8 05/27/2013   HGBA1C 8.4 12/26/2012   HGBA1C 9.4 08/22/2012   HGBA1C 9.9 05/14/2012   HGBA1C 10.6 12/26/2011   HGBA1C 8.0 09/05/2011   HGBA1C 8.2 06/15/2011   HGBA1C 8.9 02/02/2011   HGBA1C 8.6 (H) 12/07/2010   HGBA1C 8.5 11/22/2010   HGBA1C 9.4 (H) 05/02/2010   HGBA1C 8.5 04/28/2010   HGBA1C 7.5 07/27/2009   Pt is on a regimen of: - Glipizide ER 20 mg daily in am. - Metformin ER 2000 units at dinnertime. She refused insulins in the past. We tried to start in 01/2016 >> but she dropped too low (80s) >> stopped insulin after 3 days. She was on GLP1 R agonists (Victoza), but stopped 2/2 cost (doughnut hole). She was on this up until 06/02/2015. She was on Januvia >> did not help. She was on Actos >> swelling.  Pt checks her sugars 2-3 a day and they are (no log): - am: 347-390 >> 250-375 >> 197-264 - 2h after b'fast: n/c - before lunch: n/c - 2h after lunch: n/c - before dinner: 240s >> 90-113 >> 90-130 - 2h after dinner: n/c - bedtime: 175-200s >> 190-200s >> 100-130 - nighttime: n/c No lows. Lowest sugar was 92 >> 90 >> 80s Highest sugar was 409 >> 395 >> 300s.  Glucometer: OneTouch Ultra  Pt's meals are: - Brunch (12 pm): sandwich or salad - Dinner: meat +  veggies or salad + starch - Snacks: crackers, potato chips - pm or after dinner "I am a sugar addict" >> trying to cut the sweets out. On diet drinks.  - + h/o mild CKD, last BUN/creatinine: Lab Results  Component Value Date   BUN 26 (H) 12/22/2015   BUN 24 11/09/2015   CREATININE 1.07 (H) 12/22/2015   CREATININE 0.86 11/09/2015   - last set of lipids: Lab Results  Component Value Date   CHOL 242 (H) 01/27/2015   HDL 52 01/27/2015   LDLCALC 162 (H) 01/27/2015   LDLDIRECT 178 (H) 05/12/2015   TRIG 141 01/27/2015   CHOLHDL 4.7 01/27/2015  On Pravastatin. - last eye exam was in 07/06/2015. No DR.  - + numbness and tingling in her feet.  Husband passed away in 02-Jun-2015.  She also has a history of HTN, HL, hypothyroidism. Latest TSH was slightly elevated, after which her levothyroxine dose has been adjusted up.  ROS: Constitutional: no weight gain, no fatigue, no subjective hyperthermia/hypothermia Eyes: no blurry vision, no xerophthalmia ENT: no sore throat, no nodules palpated in throat, no dysphagia/odynophagia, no hoarseness Cardiovascular: no CP/SOB/palpitations/no leg swelling Respiratory: no cough/SOB Gastrointestinal: no N/V/D/C Musculoskeletal: + muscle/+ joint aches Skin: no rashes Neurological: no tremors/numbness/tingling/dizziness  I reviewed pt's medications, allergies, PMH, social hx, family hx, and changes were documented in the  history of present illness. Otherwise, unchanged from my initial visit note:  Past Medical History:  Diagnosis Date  . Allergy   . Anemia due to GI blood loss 08/12/2011  . Arthritis   . Asthma   . Atrial flutter ([place]) 12-07-2010   converted in ED with 300 mg flecainide  . CAD (coronary artery disease)    a. mild per cath in 2004;  b. nonischemic Myoview in March 2012;  c. Lex MV 1/14:  EF 66%, no ischemia  . Chronic anticoagulation - Xarelto started 07/07/2015, CHADS2CVASC=5 07/07/2015  . External hemorrhoids 06/07/2010  .  Gastric antral vascular ectasia    source for gi bleed in 07/2011 - Xarelto stopped  . Gastritis   . GERD (gastroesophageal reflux disease)   . Hiatal hernia   . Hyperlipidemia   . Hypertension   . Hypothyroidism   . Neuromuscular disorder ([place])   . Obesity   . Paroxysmal atrial fibrillation ([facility])   . Personal history of colonic polyps 06/06/2009   cecal polyp  . Sleep apnea    does not use CPAP  . Type 2 diabetes mellitus with diabetic chronic kidney disease ([place]) 04/25/2006      . Ulcer [facility])    Past Surgical History:  Procedure Laterality Date  . BREAST BIOPSY Left   . CARDIAC CATHETERIZATION  1999&2004  . CARPAL TUNNEL RELEASE Bilateral 2003  . CATARACT EXTRACTION Bilateral   . COLONOSCOPY    . ESOPHAGOGASTRODUODENOSCOPY  08/13/2011   Procedure: ESOPHAGOGASTRODUODENOSCOPY (EGD);  Surgeon: Gatha Mayer, MD;  Location[facility] ENDOSCOPY;  Service: Endoscopy;  Laterality: N/A;  . POLYPECTOMY     Social History   Social History  . Marital status: Widowed    Spouse name: Iona Beard  . Number of children: 4  . Years of education: some colle   Occupational History  . Management Unemployed   Social History Main Topics  . Smoking status: Former Smoker    Packs/day: 2.00    Years: 30.00    Types: Cigarettes    Quit date: 02/26/1993  . Smokeless tobacco: Never Used  . Alcohol use No  . Drug use: No  . Sexual activity: Not Currently   Other Topics Concern  . Not on file   Social History Narrative  . No narrative on file   Current Outpatient Prescriptions on File Prior to Visit  Medication Sig Dispense Refill  . Cholecalciferol (VITAMIN D3) 1000 UNITS CAPS Take 1 capsule by mouth daily.     . clobetasol cream (TEMOVATE) [id number] % Apply 1 application topically as directed.     . Colchicine 0.6 MG CAPS Take 1 tablet by mouth daily as needed (gout/pain). Reported on 09/02/2015    . diltiazem (CARDIZEM CD) 360 MG 24 hr capsule Take 1 capsule (360 mg total) by mouth daily. 30 capsule 3   . diltiazem (CARDIZEM) 30 MG tablet Take 1 tablet every 4 hours AS NEEDED for heart rate >100 as long as blood pressure >100. 45 tablet 3  . glipiZIDE (GLUCOTROL XL) 10 MG 24 hr tablet Take 1 tablet (10 mg total) by mouth daily with breakfast. (Patient taking differently: Take 10 mg by mouth 2 (two) times daily. ) 180 tablet 3  . hydrochlorothiazide (HYDRODIURIL) 12.5 MG tablet Take 1 tablet (12.5 mg total) by mouth daily. 90 tablet 3  . Insulin Pen Needle 32G X 4 MM MISC Use 1x a day 100 each 3  . levothyroxine (SYNTHROID, LEVOTHROID) 112 MCG tablet Take 1 tablet (112  mcg total) by mouth daily. 90 tablet 3  . metFORMIN (GLUCOPHAGE-XR) 500 MG 24 hr tablet Take 4 tablets (2,000 mg total) by mouth daily with breakfast. 360 tablet 3  . metoprolol succinate (TOPROL-XL) 100 MG 24 hr tablet TAKE 1 TABLET BY MOUTH  DAILY WITH OR IMMEDIATELY  FOLLOWING A MEAL 90 tablet 3  . nitroGLYCERIN (NITROSTAT) 0.4 MG SL tablet Place 1 tablet (0.4 mg total) under the tongue every 5 (five) minutes as needed for chest pain. 25 tablet 1  . ONE TOUCH ULTRA TEST test strip     . rivaroxaban (XARELTO) 20 MG TABS tablet Take 1 tablet (20 mg total) by mouth daily with supper. 90 tablet 3  . traMADol (ULTRAM) 50 MG tablet Take 50 mg by mouth every 6 (six) hours as needed for moderate pain or severe pain.     No current facility-administered medications on file prior to visit.    Allergies  Allergen Reactions  . Lisinopril Swelling    REACTION: swelling, may have had some breathing involvement.   . Nsaids Other (See Comments)    Patient reports internal bleeding  . Penicillins Shortness Of Breath and Swelling    Arm Swelling with Penicillin (Occurred in 1960s) Breathing - throat swelling with Amoxicillin (Occurred prior to 2002)  . Sulfamethoxazole Hives and Itching    "welps all over" immediately after dose  . Actos [Pioglitazone] Swelling    REACTION: swelling all over body  . Iodinated Diagnostic Agents Itching     Pt. Developed mild itching after receiving IV cm; pt. Held; Dr. Keane Scrape recomended she take 50 mg of benadryl when she goes home-if necessary; Dr Mickey Farber recommends benadryl prior to future exams requiring contrast media, but stated other doctors may recommend another premedication prep.  . Flecainide Other (See Comments)    Blurry  vision  . Gabapentin     Caused dysphora  . Losartan Potassium     swelling   Family History  Problem Relation Age of Onset  . Heart disease Father   . Diabetes Maternal Grandfather   . Hypertension Mother   . Other Mother     brain tumor-benign  . Diabetes Daughter     pre-diabeties  . Colon polyps Daughter   . Thyroid disease Daughter     x 2  . Colon cancer Neg Hx    PE: BP (!) 142/84 (BP Location: Left Arm, Patient Position: Sitting)   Pulse 68   Wt 235 lb (106.6 kg)   SpO2 98%   BMI 39.11 kg/m  Wt Readings from Last 3 Encounters:  04/12/16 235 lb (106.6 kg)  04/12/16 234 lb 6.4 oz (106.3 kg)  02/21/16 237 lb (107.5 kg)   Constitutional: obese, in NAD Eyes: PERRLA, EOMI, no exophthalmos ENT: moist mucous membranes, no thyromegaly, no cervical lymphadenopathy Cardiovascular: RRR, No MRG Respiratory: CTA B Gastrointestinal: abdomen soft, NT, ND, BS+ Musculoskeletal: no deformities, strength intact in all 4 Skin: moist, warm, no rashes Neurological: no tremor with outstretched hands, DTR normal in all 4  ASSESSMENT: 1. DM2, insulin-dependent, uncontrolled, with complications - mild CKD - CAD - PAF  PLAN:  1. Patient with long-standing, uncontrolled diabetes, on oral antidiabetic regimen. At last visit, sugars are very high,300s in a.m. I explained that they indicate a state of glucose toxicity, which can only be reversed with insulin, and she finally accepted to start insulin. I also suggested a plant-based diet.  - at this visit, she tells me insulin dropped her sugars too  low and she stopped it after 3 days. However, she started  a vegan diet and her sugars are much better. She also feels much better and bought a stationary bike and plans to start exercising at home. - as sugars in am are not at goal >> she would like to retry Victoza. Will try to start Trulicity and decrease Glipizide. - at last visit, her HbA1c:10.4% (higher) - I suggested to:  Patient Instructions  Please continue: - Metformin ER 2000 units at dinnertime.  Please decrease: - Glipizide ER 10 mg daily in am.  Please start: - Trulicity A999333 mg weekly. Let me know when you are close to running out to call in the higher dose to your pharmacy (1.5 mg).  Please return in 1.5 months with your sugar log.   - Continue  checking sugars at different times of the day - check 2 times a day, rotating checks - advised for yearly eye exams >> she is UTD - Return to clinic in 1.5 mo with sugar log   Philemon Kingdom, MD PhD [facility] Endocrinology

## 2016-04-12 NOTE — Patient Instructions (Addendum)
Please continue: - Metformin ER 2000 units at dinnertime.  Please decrease: - Glipizide ER 10 mg daily in am.  Please start: - Trulicity A999333 mg weekly. Let me know when you are close to running out to call in the higher dose to your pharmacy (1.5 mg).  Please return in 1.5 months with your sugar log.

## 2016-04-18 ENCOUNTER — Ambulatory Visit: Payer: Medicare Other | Admitting: Family Medicine

## 2016-05-02 ENCOUNTER — Ambulatory Visit (INDEPENDENT_AMBULATORY_CARE_PROVIDER_SITE_OTHER): Payer: Medicare Other | Admitting: Family Medicine

## 2016-05-02 ENCOUNTER — Encounter: Payer: Self-pay | Admitting: Family Medicine

## 2016-05-02 ENCOUNTER — Other Ambulatory Visit: Payer: Self-pay | Admitting: Internal Medicine

## 2016-05-02 ENCOUNTER — Telehealth: Payer: Self-pay

## 2016-05-02 ENCOUNTER — Other Ambulatory Visit: Payer: Medicare Other

## 2016-05-02 ENCOUNTER — Telehealth: Payer: Self-pay | Admitting: Internal Medicine

## 2016-05-02 VITALS — BP 128/56 | HR 79 | Temp 97.9°F | Ht 65.0 in | Wt 229.8 lb

## 2016-05-02 DIAGNOSIS — D500 Iron deficiency anemia secondary to blood loss (chronic): Secondary | ICD-10-CM

## 2016-05-02 DIAGNOSIS — I480 Paroxysmal atrial fibrillation: Secondary | ICD-10-CM | POA: Diagnosis not present

## 2016-05-02 DIAGNOSIS — M1A09X0 Idiopathic chronic gout, multiple sites, without tophus (tophi): Secondary | ICD-10-CM | POA: Diagnosis not present

## 2016-05-02 DIAGNOSIS — I2510 Atherosclerotic heart disease of native coronary artery without angina pectoris: Secondary | ICD-10-CM | POA: Diagnosis not present

## 2016-05-02 DIAGNOSIS — E7800 Pure hypercholesterolemia, unspecified: Secondary | ICD-10-CM | POA: Diagnosis not present

## 2016-05-02 DIAGNOSIS — E1122 Type 2 diabetes mellitus with diabetic chronic kidney disease: Secondary | ICD-10-CM | POA: Diagnosis not present

## 2016-05-02 DIAGNOSIS — E038 Other specified hypothyroidism: Secondary | ICD-10-CM

## 2016-05-02 LAB — POCT GLYCOSYLATED HEMOGLOBIN (HGB A1C): Hemoglobin A1C: 8.4

## 2016-05-02 MED ORDER — DULAGLUTIDE 1.5 MG/0.5ML ~~LOC~~ SOAJ: SUBCUTANEOUS | 5 refills | Status: DC

## 2016-05-02 NOTE — Assessment & Plan Note (Signed)
Needs TSH.

## 2016-05-02 NOTE — Progress Notes (Signed)
   Subjective:    Patient ID: Courtney Pena, female    DOB: [birth year], 76 y.o.   MRN: [id number]  HPI Recent DM and HBP. 1. DM.  Has seen endocrinologist.  Started on trulicity.  Wanted her to start on insulin - she still refuses.  Has gotten very serious about diet and exercise - following a book she received from endo.  A1C is down to 8.4 which is a huge drop/victory for her. 2. Hx of CAD.  Due for lipid panel.  Refuses statins due to side effects. No chest pain. 3. Hx of anemia of chronic blood loss.  Feels stable on iron.  Due for recheck. No visible blood loss. 4. Hypertension.  Hx of marginal control.  New diabetic diet is also low sodium.   5. Morbid obesity.  With new diet and increased exercise, she is losing weight. 6. Aysmptomatic from thyroid standpoint.  No recent TSH. 7. PAF now tolerating xarelto.  No palpitations. 8. Occasional gout.  On HCTZ.  May be able to wean since BP now controled.    Review of Systems     Objective:   Physical Exam  Skin, normal, not pale Lungs clear Cardiac RRR with 2/6 SEM without g Abd benign Ext no edema.       Assessment & Plan:

## 2016-05-02 NOTE — Telephone Encounter (Signed)
Refill   Dulaglutide (TRULICITY) 0.93 JP/2.1KK SOPN  [facility], [place] [phone] (Phone) [phone] (Fax)   3 month supply  need to talk to [facility] before she send it out

## 2016-05-02 NOTE — Telephone Encounter (Signed)
Great. I sent the higher dose to her local pharmacy.

## 2016-05-02 NOTE — Assessment & Plan Note (Signed)
Seems nicely improved.  Check BMP

## 2016-05-02 NOTE — Addendum Note (Signed)
Addended by: Londell Moh T on: 05/02/2016 05:40 PM   Modules accepted: Orders

## 2016-05-02 NOTE — Assessment & Plan Note (Signed)
Stable on current meds controling risk factors.

## 2016-05-02 NOTE — Assessment & Plan Note (Signed)
Seems in sinus.  Seems to be tolerating xarelto.

## 2016-05-02 NOTE — Addendum Note (Signed)
Addended by: Londell Moh T on: 05/02/2016 05:42 PM   Modules accepted: Orders

## 2016-05-02 NOTE — Telephone Encounter (Signed)
Returned patient phone call, patient has one more pen left of the 2.17 mg trulicity, has tolerated it well. Okay to increase?? Also, patient states she went to PCP today and they check A1c it was 8.4, so it had decreased and wanted you to be aware. Please advise. Thank you!

## 2016-05-02 NOTE — Patient Instructions (Signed)
Try water aerobics for exercise. You are doing great.  Stick with it. Get fasting blood work done tomorrow. I will call with results.  See me every three months. Try the HCTZ every other day.  Hopefully, less gout and still good control of BP.

## 2016-05-02 NOTE — Assessment & Plan Note (Signed)
Check CBC 

## 2016-05-02 NOTE — Assessment & Plan Note (Signed)
Has needed low dose HCTZ.  Try every other day to decrease attacks.

## 2016-05-03 ENCOUNTER — Other Ambulatory Visit: Payer: Medicare Other

## 2016-05-03 ENCOUNTER — Other Ambulatory Visit: Payer: Self-pay | Admitting: Family Medicine

## 2016-05-04 LAB — COMPREHENSIVE METABOLIC PANEL
ALT: 14 IU/L (ref 0–32)
AST: 14 IU/L (ref 0–40)
Albumin/Globulin Ratio: 1.5 (ref 1.2–2.2)
Albumin: 4.5 g/dL (ref 3.5–4.8)
Alkaline Phosphatase: 63 IU/L (ref 39–117)
BUN/Creatinine Ratio: 26 (ref 12–28)
BUN: 25 mg/dL (ref 8–27)
Bilirubin Total: 0.3 mg/dL (ref 0.0–1.2)
CO2: 23 mmol/L (ref 18–29)
Calcium: 9.7 mg/dL (ref 8.7–10.3)
Chloride: 97 mmol/L (ref 96–106)
Creatinine, Ser: 0.96 mg/dL (ref 0.57–1.00)
GFR calc Af Amer: 66 mL/min/{1.73_m2} (ref >59)
GFR calc non Af Amer: 58 mL/min/{1.73_m2} — ABNORMAL LOW (ref >59)
Globulin, Total: 3.1 g/dL (ref 1.5–4.5)
Glucose: 125 mg/dL — ABNORMAL HIGH (ref 65–99)
Potassium: 4.5 mmol/L (ref 3.5–5.2)
Sodium: 137 mmol/L (ref 134–144)
Total Protein: 7.6 g/dL (ref 6.0–8.5)

## 2016-05-04 LAB — CBC WITH DIFFERENTIAL/PLATELET
Basophils Absolute: 0 10*3/uL (ref 0.0–0.2)
Basos: 1 %
EOS (ABSOLUTE): 0.3 10*3/uL (ref 0.0–0.4)
Eos: 3 %
Hematocrit: 34.6 % (ref 34.0–46.6)
Hemoglobin: 11.2 g/dL (ref 11.1–15.9)
Immature Grans (Abs): 0 10*3/uL (ref 0.0–0.1)
Immature Granulocytes: 0 %
Lymphocytes Absolute: 2 10*3/uL (ref 0.7–3.1)
Lymphs: 23 %
MCH: 26.9 pg (ref 26.6–33.0)
MCHC: 32.4 g/dL (ref 31.5–35.7)
MCV: 83 fL (ref 79–97)
Monocytes Absolute: 0.5 10*3/uL (ref 0.1–0.9)
Monocytes: 6 %
Neutrophils Absolute: 5.9 10*3/uL (ref 1.4–7.0)
Neutrophils: 67 %
Platelets: 336 10*3/uL (ref 150–379)
RBC: 4.16 x10E6/uL (ref 3.77–5.28)
RDW: 16.9 % — ABNORMAL HIGH (ref 12.3–15.4)
WBC: 8.7 10*3/uL (ref 3.4–10.8)

## 2016-05-04 LAB — LIPID PANEL
Chol/HDL Ratio: 3.9 ratio units (ref 0.0–4.4)
Cholesterol, Total: 270 mg/dL — ABNORMAL HIGH (ref 100–199)
HDL: 69 mg/dL (ref >39)
LDL Calculated: 171 mg/dL — ABNORMAL HIGH (ref 0–99)
Triglycerides: 152 mg/dL — ABNORMAL HIGH (ref 0–149)
VLDL Cholesterol Cal: 30 mg/dL (ref 5–40)

## 2016-05-04 LAB — TSH: TSH: 4.59 u[IU]/mL — ABNORMAL HIGH (ref 0.450–4.500)

## 2016-05-04 NOTE — Assessment & Plan Note (Signed)
Lipid panel still VERY concerning (LDL=171)  Discussed with both Dr. Valentina Lucks and patient.  Dr. Valentina Lucks has some ideas.  Patient willing to see him.  She will make an appointment.

## 2016-05-10 ENCOUNTER — Ambulatory Visit (INDEPENDENT_AMBULATORY_CARE_PROVIDER_SITE_OTHER): Payer: Medicare Other | Admitting: Pharmacist

## 2016-05-10 ENCOUNTER — Encounter: Payer: Self-pay | Admitting: Pharmacist

## 2016-05-10 DIAGNOSIS — E7800 Pure hypercholesterolemia, unspecified: Secondary | ICD-10-CM | POA: Diagnosis not present

## 2016-05-10 MED ORDER — ROSUVASTATIN CALCIUM 5 MG PO TABS: 5.0000 mg | ORAL_TABLET | Freq: Every day | ORAL | 0 refills | Status: DC

## 2016-05-10 NOTE — Progress Notes (Signed)
Patient ID: Courtney Pena, female   DOB: January 19, 1941, 77 y.o.   MRN: [id number] Reviewed: Agree with Dr. Graylin Shiver documentation and management.

## 2016-05-10 NOTE — Progress Notes (Addendum)
S:    Chief Complaint  Patient presents with  . Medication Management    Hyperlipidemia   Patient arrives in good spirits ambulating without assistance.  Presents for hyperlipidemia evaluation, education, and management at the request of Dr Andria Frames. Patient was referred on 05/02/16.  Patient was last seen by Primary Care Provider on 05/02/16.  Reports her blood glucose is doing much better over the past few months with slight nausea/palpitations upon initiation of Trulicity which have since resolved. She has started diet and exercise changes early in 2018.  Today she brings in [employer] and is questioning if she should start this for her cholesterol.   Reports she has been taking vitamin D 6000 IU for 8 months.   She has decreased HCTZ due to gout flares but now states she is having some lower extremity edema.  She has not started the 1.5 mg weeklydose yet.    Not currently taking Xarelto with food. Denies signs/symptoms of bleeding.   Family/Social History: Father (unknown heart problems)  Patient reports adherence with medications.  Current lipid medications include: none Previously tried medications for hyperlipidemia: Pravastatin (various dosing 10-40 mg daily and every other day), rosuvastatin 10 mg daily, atorvastatin (unknown dose) - cramping in lower legs/feet which resolved upon discontinuation)    Patient reported dietary habits: Has cut out red meat completely and is almost vegan.  Currently eating salmon, tuna, some chicken (broiled, grilled, pan searing with olive oil).  Has cut down butter intake.    Patient reported exercise habits: [employer] bike 3 times per week (limited by arthritis previously).    Diet changes were started in early 2018 followed by exercise changes.    O:  Physical Exam  Musculoskeletal: She exhibits edema.  1+ edema lower extremities  Vitals reviewed.  Review of Systems  Constitutional: Negative.    Lab Results  Component Value Date   HGBA1C 8.4 05/02/2016   Vitals:   05/10/16 0922  BP: 136/68  Pulse: 67   A/P: Hyperlipidemia:  LDL currently above goal of <100 mg/dL at 171 mg/dL currently on no lipid lowering therapy but recently initiated diet and exercise changes.  Patient previously intolerant to pravastatin (various dosing 10-40 mg daily and every other day), rosuvastatin 10 mg daily, atorvastatin (unknown dose) - cramping in lower legs/feet which resolved upon discontinuation. Patient willing to trial re-initiation of low dose statin.  -Will start rosuvastatin 5 mg weekly.   -Discussed cholesterol values, and risk vs benefit including risk of myalgias with statins vs ezetimibe.  Patient to call clinic if experiences cramping/muscle pain/muscle weakness.  -Will consider further titration of statin and addition of ezetimibe at next visit.    Hypertension longstanding diagnosed currently controlled.  Patient reports adherence with medication. Patient has decreased HCTZ to 12.5 mg every other day due to gout flares but reports slightly increased edema.  -Will decrease HCTZ to 12.5 mg every 3 days.  Instructed patient to monitor for edema and discussed elevation and compression stockings.   Gout: Currently uncontrolled with flares occurring every 2-3 months, currently on colchicine for flares but no current Urate Lowering Therapy (ULT). HCTZ may be contributing to flares.  Will consider initiation of ULT + scheduled colchicine x 6 months in the future.  Will not initiate at this time due to potential of patient confusion of gout flare vs statin intolerance.    Atrial Fibrillation: Denies signs/symptoms of bleeding on Xarelto and denies current afib symptoms.  Instructed patient to start  taking Xarelto with evening meal due to increased absorption.   Vitamin D Deficiency:  Last Vitamin D level 25 in 2016.  Patient has been taking Vitamin D 6000 IU for >6 months.  Consider vitamin D level at future visit and lower dose to 4000IU  per day if repleted to normal.   Written patient instructions provided.  Total time in face to face counseling 40 minutes.   Follow up in Pharmacist Clinic Visit in 3-4 weeks.   Patient seen with Myrna Blazer, PharmD Candidate, Ida Rogue, PharmD Candidate,. and Bennye Alm, PharmD, BCPS, PGY2 Resident.

## 2016-05-10 NOTE — Patient Instructions (Addendum)
Start rosuvastatin 5 mg once weekly.  Please call clinic if you have any muscle pains or cramping  Decrease Hydrochlorothiazide 12.5 mg every third day (may increase as needed for fluid).   Continue exercise and diet changes  Try to elevate your legs as much as possible.  Continue compression hoses  Followup with Dr Valentina Lucks in 3-4 weeks

## 2016-05-21 ENCOUNTER — Other Ambulatory Visit: Payer: Self-pay | Admitting: Family Medicine

## 2016-05-29 ENCOUNTER — Encounter: Payer: Self-pay | Admitting: Cardiovascular Disease

## 2016-05-31 ENCOUNTER — Ambulatory Visit: Payer: Medicare Other | Admitting: Pharmacist

## 2016-06-05 ENCOUNTER — Encounter: Payer: Self-pay | Admitting: Internal Medicine

## 2016-06-05 ENCOUNTER — Ambulatory Visit (INDEPENDENT_AMBULATORY_CARE_PROVIDER_SITE_OTHER): Payer: Medicare Other | Admitting: Internal Medicine

## 2016-06-05 VITALS — BP 140/84 | HR 80 | Wt 234.0 lb

## 2016-06-05 DIAGNOSIS — E1122 Type 2 diabetes mellitus with diabetic chronic kidney disease: Secondary | ICD-10-CM | POA: Diagnosis not present

## 2016-06-05 MED ORDER — DULAGLUTIDE 1.5 MG/0.5ML ~~LOC~~ SOAJ: SUBCUTANEOUS | 3 refills | Status: DC

## 2016-06-05 NOTE — Patient Instructions (Addendum)
Please continue: - Metformin ER 2000 units at dinnertime. - Glipizide ER 10 mg daily in am. - Trulicity 1.5 mg weekly  >> move this in am  Please return in 3 months with your sugar log.   Keep up the great work!

## 2016-06-05 NOTE — Progress Notes (Signed)
Patient ID: Courtney Pena, female   DOB: Jun 20, 1940, 76 y.o.   MRN: [id number]   HPI: Courtney Pena is a 76 y.o.-year-old female, initially referred by Dr. Johnsie Cancel, returning for follow-up for DM2, dx in ~2000, insulin-dependent (but refusing insulin), uncontrolled, with complications (mild CKD, CAD, PAF). Last visit 2 months ago.  Before last visit, she started a vegan diet >> sugars greatly improved! At this visit, she tells me that she relaxed her diet a little, however, she plans to get back to a strictly vegan diet.  She had also started to exercise (bicycle) >> now stopped b/c knee OA, but plans to restart.   Last hemoglobin A1c was: Lab Results  Component Value Date   HGBA1C 8.4 05/02/2016   HGBA1C 10.4 02/21/2016   HGBA1C 10.1 11/09/2015   HGBA1C 9..2 06/23/2015   HGBA1C 9.0 01/27/2015   HGBA1C 9.4 07/09/2014   HGBA1C 9.7 02/12/2014   HGBA1C 9.4 09/18/2013   HGBA1C 9.8 05/27/2013   HGBA1C 8.4 12/26/2012   HGBA1C 9.4 08/22/2012   HGBA1C 9.9 05/14/2012   HGBA1C 10.6 12/26/2011   HGBA1C 8.0 09/05/2011   HGBA1C 8.2 06/15/2011   HGBA1C 8.9 02/02/2011   HGBA1C 8.6 (H) 12/07/2010   HGBA1C 8.5 11/22/2010   HGBA1C 9.4 (H) 05/02/2010   HGBA1C 8.5 04/28/2010   Pt is on a regimen of: - Metformin ER 2000 units at dinnertime. - Glipizide ER 10 mg daily in am. - Trulicity 1.5 mg weekly. She refused insulins in the past. We tried to start in 01/2016 >> but she dropped too low (80s) >> stopped insulin after 3 days. She was on GLP1 R agonists (Victoza), but stopped 2/2 cost (doughnut hole). She was on this up until 06/19/15. She was on Januvia >> did not help. She was on Actos >> swelling.  Pt checks her sugars 2-3 a day and they are (per log): - am: 347-390 >> 250-375 >> 197-264 >> 153-225, 251  (stress) - 2h after b'fast: n/c - before lunch: n/c >> 175, 180, 200 - 2h after lunch: n/c - before dinner: 240s >> 90-113 >> 90-130 >> 120, 128 - 2h after dinner: n/c -  bedtime: 175-200s >> 190-200s >> 100-130 >> 99, 104-144 - nighttime: n/c No lows. Lowest sugar was 92 >> 90 >> 80s Highest sugar was 409 >> 395 >> 300s.  Glucometer: OneTouch Ultra  Pt's meals are: - (-B'fast: kale + fruit smoothie) - Brunch (12 pm): sandwich or salad - Dinner: meat + veggies or salad + starch - Snacks: crackers, potato chips - pm or after dinner "I am a sugar addict" >> trying to cut the sweets out, but she had more recently On diet drinks.  - + h/o mild CKD, last BUN/creatinine: Lab Results  Component Value Date   BUN 25 05/03/2016   BUN 26 (H) 12/22/2015   CREATININE 0.96 05/03/2016   CREATININE 1.07 (H) 12/22/2015   - last set of lipids: Lab Results  Component Value Date   CHOL 270 (H) 05/03/2016   HDL 69 05/03/2016   LDLCALC 171 (H) 05/03/2016   LDLDIRECT 178 (H) 05/12/2015   TRIG 152 (H) 05/03/2016   CHOLHDL 3.9 05/03/2016  Started Crestor. - last eye exam was in 07/06/2015. No DR.  - + numbness and tingling in her feet.  Husband passed away in 2015-06-19.  She also has a history of HTN, HL, hypothyroidism. Latest TSH was slightly elevated, after which her levothyroxine dose has been adjusted up. Lab Results  Component Value  Date   TSH 4.590 (H) 05/03/2016   She stopped HCTZ b/c gout flares - 2 mo ago. She started Crestor since last visit.  ROS: Constitutional: no weight gain, no fatigue, no subjective hyperthermia/hypothermia Eyes: no blurry vision, no xerophthalmia ENT: no sore throat, no nodules palpated in throat, no dysphagia/odynophagia, no hoarseness Cardiovascular: no CP/SOB/palpitations/no leg swelling Respiratory: no cough/SOB Gastrointestinal: no N/V/D/C Musculoskeletal: no muscle/joint aches Skin: no rashes Neurological: no tremors/numbness/tingling/dizziness  I reviewed pt's medications, allergies, PMH, social hx, family hx, and changes were documented in the history of present illness. Otherwise, unchanged from my initial visit  note:  Past Medical History:  Diagnosis Date  . Allergy   . Anemia due to GI blood loss 08/12/2011  . Arthritis   . Asthma   . Atrial flutter ([place]) 12-07-2010   converted in ED with 300 mg flecainide  . CAD (coronary artery disease)    a. mild per cath in 2004;  b. nonischemic Myoview in March 2012;  c. Lex MV 1/14:  EF 66%, no ischemia  . Chronic anticoagulation - Xarelto started 07/07/2015, CHADS2CVASC=5 07/07/2015  . External hemorrhoids 06/07/2010  . Gastric antral vascular ectasia    source for gi bleed in 07/2011 - Xarelto stopped  . Gastritis   . GERD (gastroesophageal reflux disease)   . Hiatal hernia   . Hyperlipidemia   . Hypertension   . Hypothyroidism   . Neuromuscular disorder ([place])   . Obesity   . Paroxysmal atrial fibrillation (HCC)   . Personal history of colonic polyps 06/06/2009   cecal polyp  . Sleep apnea    does not use CPAP  . Type 2 diabetes mellitus with diabetic chronic kidney disease ([place]) 04/25/2006      . Ulcer [facility])    Past Surgical History:  Procedure Laterality Date  . BREAST BIOPSY Left   . CARDIAC CATHETERIZATION  1999&2004  . CARPAL TUNNEL RELEASE Bilateral 2003  . CATARACT EXTRACTION Bilateral   . COLONOSCOPY    . ESOPHAGOGASTRODUODENOSCOPY  08/13/2011   Procedure: ESOPHAGOGASTRODUODENOSCOPY (EGD);  Surgeon: Gatha Mayer, MD;  Location: [facility] ENDOSCOPY;  Service: Endoscopy;  Laterality: N/A;  . POLYPECTOMY     Social History   Social History  . Marital status: Widowed    Spouse name: Iona Beard  . Number of children: 4  . Years of education: some colle   Occupational History  . Management Unemployed   Social History Main Topics  . Smoking status: Former Smoker    Packs/day: 2.00    Years: 30.00    Types: Cigarettes    Quit date: 02/26/1993  . Smokeless tobacco: Never Used  . Alcohol use No  . Drug use: No  . Sexual activity: Not Currently   Other Topics Concern  . Not on file   Social History Narrative  . No narrative on file    Current Outpatient Prescriptions on File Prior to Visit  Medication Sig Dispense Refill  . Cholecalciferol (VITAMIN D3) 2000 units TABS Take 3 capsules by mouth daily.     . clobetasol cream (TEMOVATE) 1.47 % Apply 1 application topically as needed.     . Colchicine 0.6 MG CAPS Take 1 tablet by mouth daily as needed (gout/pain). Reported on 09/02/2015    . diltiazem (CARDIZEM CD) 360 MG 24 hr capsule TAKE 1 CAPSULE BY MOUTH  DAILY 90 capsule 3  . diltiazem (CARDIZEM) 30 MG tablet Take 1 tablet every 4 hours AS NEEDED for heart rate >100 as long as  blood pressure >100. 45 tablet 3  . Dulaglutide (TRULICITY) 1.5 WS/5.6CL SOPN Inject 1.5 mg under skin every week 4 pen 5  . glipiZIDE (GLUCOTROL XL) 10 MG 24 hr tablet Take 1 tablet (10 mg total) by mouth daily with breakfast. 180 tablet 3  . Insulin Pen Needle 32G X 4 MM MISC Use 1x a day 100 each 3  . levothyroxine (SYNTHROID, LEVOTHROID) 112 MCG tablet Take 1 tablet (112 mcg total) by mouth daily. 90 tablet 3  . metFORMIN (GLUCOPHAGE-XR) 500 MG 24 hr tablet Take 4 tablets (2,000 mg total) by mouth daily with breakfast. 360 tablet 3  . metoprolol succinate (TOPROL-XL) 100 MG 24 hr tablet TAKE 1 TABLET BY MOUTH  DAILY WITH OR IMMEDIATELY  FOLLOWING A MEAL 90 tablet 3  . nitroGLYCERIN (NITROSTAT) 0.4 MG SL tablet Place 1 tablet (0.4 mg total) under the tongue every 5 (five) minutes as needed for chest pain. 25 tablet 1  . ONE TOUCH ULTRA TEST test strip TEST 3 TIMES DAILY 300 each 3  . rivaroxaban (XARELTO) 20 MG TABS tablet Take 1 tablet (20 mg total) by mouth daily with supper. 90 tablet 3  . rosuvastatin (CRESTOR) 5 MG tablet Take 1 tablet (5 mg total) by mouth daily at 6 PM. Start once weekly.  Plan to increase as tolerated. 30 tablet 0  . traMADol (ULTRAM) 50 MG tablet Take 50 mg by mouth every 6 (six) hours as needed for moderate pain or severe pain.    . hydrochlorothiazide (HYDRODIURIL) 12.5 MG tablet Take 1 tablet (12.5 mg total) by mouth  daily. (Patient not taking: Reported on 06/05/2016) 90 tablet 3   No current facility-administered medications on file prior to visit.    Allergies  Allergen Reactions  . Lisinopril Swelling    REACTION: swelling, may have had some breathing involvement.   . Nsaids Other (See Comments)    Patient reports internal bleeding  . Penicillins Shortness Of Breath and Swelling    Arm Swelling with Penicillin (Occurred in 1960s) Breathing - throat swelling with Amoxicillin (Occurred prior to 2002)  . Sulfamethoxazole Hives and Itching    "welps all over" immediately after dose  . Actos [Pioglitazone] Swelling    REACTION: swelling all over body  . Iodinated Diagnostic Agents Itching    Pt. Developed mild itching after receiving IV cm; pt. Held; Dr. Keane Scrape recomended she take 50 mg of benadryl when she goes home-if necessary; Dr Mickey Farber recommends benadryl prior to future exams requiring contrast media, but stated other doctors may recommend another premedication prep.  . Flecainide Other (See Comments)    Blurry  vision  . Gabapentin     Caused dysphora  . Losartan Potassium Other (See Comments)    Lower extremity swelling   Family History  Problem Relation Age of Onset  . Heart disease Father   . Diabetes Maternal Grandfather   . Hypertension Mother   . Other Mother     brain tumor-benign  . Diabetes Daughter     pre-diabeties  . Colon polyps Daughter   . Thyroid disease Daughter     x 2  . Colon cancer Neg Hx    PE: BP 140/84 (BP Location: Left Arm, Patient Position: Sitting)   Pulse 80   Wt 234 lb (106.1 kg)   SpO2 95%   BMI 38.94 kg/m  Wt Readings from Last 3 Encounters:  06/05/16 234 lb (106.1 kg)  05/10/16 230 lb 3.2 oz (104.4 kg)  05/02/16 229 lb  12.8 oz (104.2 kg)   Constitutional: obese, in NAD Eyes: PERRLA, EOMI, no exophthalmos ENT: moist mucous membranes, no thyromegaly, no cervical lymphadenopathy Cardiovascular: RRR, No MRG Respiratory: CTA  B Gastrointestinal: abdomen soft, NT, ND, BS+ Musculoskeletal: no deformities, strength intact in all 4 Skin: moist, warm, no rashes Neurological: no tremor with outstretched hands, DTR normal in all 4  ASSESSMENT: 1. DM2, insulin-dependent, uncontrolled, with complications - mild CKD - CAD - PAF  PLAN:  1. Patient with long-standing, uncontrolled diabetes, on oral antidiabetic regimen. Before last visit, I suggested to start insulin but she refused, and in that context, I suggested to start the vegan diet. She did start this and her sugars decreased significantly so that her HbA1c was recently 2% lower (04/2016: HbA1c 8.4%) even though she was more lax with her diet lately.  - I congratulated her and strongly advised her to get back on the diet, which he agrees to do. - Her sugars in the morning are higher, but I think these will improve with her controlling her diet better and also restarting exercise. Will not change her regimen at this time.  - I suggested to:  Patient Instructions  Please continue: - Metformin ER 2000 units at dinnertime. - Glipizide ER 10 mg daily in am. - Trulicity 1.5 mg weekly  >> move this in am  Please return in 3 months with your sugar log.   Keep up the great work!  - Continue  checking sugars at different times of the day - check 2 times a day, rotating checks - advised for yearly eye exams >> she is UTD - Return to clinic in 3 mo with sugar log   Philemon Kingdom, MD PhD [facility] Endocrinology

## 2016-06-11 NOTE — Progress Notes (Signed)
Patient ID: Courtney Pena, female   DOB: [birth year], 76 y.o.   MRN: [id number]     Cardiology Office Note   Date:  06/14/2016   ID:  [name], DOB [birth year], MRN [id number]  PCP:  Zigmund Gottron, MD  Cardiologist:  Dr Oswaldo Conroy, MD   Chief Complaint  Patient presents with  . Atherosclerosis of native coronary artery of native heart wi  . Atrial Fibrillation  . Palpitations    History of Present Illness:  Courtney Pena is a 76 y.o.  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 Other than a few intermittent flutters, she has not noticed any sustained afib. Tolerating flecainide well.. She did have a f/u ETT, for which she did not stay on the treadmill for a minute but no EKG changes were noted with exertion.  Seen in afib clinic 8/17, for c/o blurred vision. She states that she has noted vision change since starting flecainide but thought it would wear off. She stopped flecainide  and vision is already better.  She also stopped losartan because she retained fluid with previous use  She saw Dr Rayann Heman on 09/2815 and discussed options of ablation or rhythmol but ultimately decided Not to proceed with either   Husband passed in March 2017 BS does better on vegan diet   In donut hole with meds asking for xarelto samples   Having some sharp SSCP not exertional last a minute or so Ongoing for last few weeks No associated diaphoresis , dyspnea or palpitations. Has not tried nitro for it  Past Medical History:  Diagnosis Date  . Allergy   . Anemia due to GI blood loss 08/12/2011  . Arthritis   . Asthma   . Atrial flutter ([place]) 12-07-2010   converted in ED with 300 mg flecainide  . CAD (coronary artery disease)    a. mild per cath in 2004;  b. nonischemic Myoview in March  2012;  c. Lex MV 1/14:  EF 66%, no ischemia  . Chronic anticoagulation - Xarelto started 07/07/2015, CHADS2CVASC=5 07/07/2015  . External hemorrhoids 06/07/2010  . Gastric antral vascular ectasia    source for gi bleed in 07/2011 - Xarelto stopped  . Gastritis   . GERD (gastroesophageal reflux disease)   . Hiatal hernia   . Hyperlipidemia   . Hypertension   . Hypothyroidism   . Neuromuscular disorder ([place])   . Obesity   . Paroxysmal atrial fibrillation (HCC)   . Personal history of colonic polyps 06/06/2009   cecal polyp  . Sleep apnea    does not use CPAP  . Type 2 diabetes mellitus with diabetic chronic kidney disease ([place]) 04/25/2006      . Ulcer     Past Surgical History:  Procedure Laterality Date  . BREAST BIOPSY Left   . CARDIAC CATHETERIZATION  1999&2004  . CARPAL TUNNEL RELEASE Bilateral 2003  . CATARACT EXTRACTION Bilateral   . COLONOSCOPY    . ESOPHAGOGASTRODUODENOSCOPY  08/13/2011   Procedure: ESOPHAGOGASTRODUODENOSCOPY (EGD);  Surgeon: Gatha Mayer, MD;  Location: [facility] ENDOSCOPY;  Service: Endoscopy;  Laterality: N/A;  . POLYPECTOMY      Current Outpatient Prescriptions  Medication Sig Dispense Refill  . Cholecalciferol (VITAMIN D3) 2000 units TABS Take 3 capsules by mouth daily.     . clobetasol cream (TEMOVATE) 1.24 % Apply 1 application topically  as needed.     . Colchicine 0.6 MG CAPS Take 1 tablet by mouth daily as needed (gout/pain). Reported on 09/02/2015    . diltiazem (CARDIZEM CD) 360 MG 24 hr capsule TAKE 1 CAPSULE BY MOUTH  DAILY 90 capsule 3  . diltiazem (CARDIZEM) 30 MG tablet Take 1 tablet every 4 hours AS NEEDED for heart rate >100 as long as blood pressure >100. 45 tablet 3  . Dulaglutide (TRULICITY) 1.5 LU/9.4PT SOPN Inject 1.5 mg under skin every week 12 pen 3  . glipiZIDE (GLUCOTROL XL) 10 MG 24 hr tablet Take 1 tablet (10 mg total) by mouth daily with breakfast. 180 tablet 3  . hydrochlorothiazide (HYDRODIURIL) 12.5 MG tablet Take 1 tablet (12.5  mg total) by mouth daily. 90 tablet 3  . Insulin Pen Needle 32G X 4 MM MISC Use 1x a day 100 each 3  . levothyroxine (SYNTHROID, LEVOTHROID) 112 MCG tablet Take 1 tablet (112 mcg total) by mouth daily. 90 tablet 3  . metFORMIN (GLUCOPHAGE-XR) 500 MG 24 hr tablet Take 4 tablets (2,000 mg total) by mouth daily with breakfast. 360 tablet 3  . metoprolol succinate (TOPROL-XL) 100 MG 24 hr tablet TAKE 1 TABLET BY MOUTH  DAILY WITH OR IMMEDIATELY  FOLLOWING A MEAL 90 tablet 3  . nitroGLYCERIN (NITROSTAT) 0.4 MG SL tablet Place 1 tablet (0.4 mg total) under the tongue every 5 (five) minutes as needed for chest pain. 25 tablet 1  . ONE TOUCH ULTRA TEST test strip TEST 3 TIMES DAILY 300 each 3  . rivaroxaban (XARELTO) 20 MG TABS tablet Take 1 tablet (20 mg total) by mouth daily with supper. 90 tablet 3  . rosuvastatin (CRESTOR) 5 MG tablet Take 1 tablet (5 mg total) by mouth daily at 6 PM. Start once weekly.  Plan to increase as tolerated. 30 tablet 0  . traMADol (ULTRAM) 50 MG tablet Take 50 mg by mouth every 6 (six) hours as needed for moderate pain or severe pain.     No current facility-administered medications for this visit.     Allergies:   Lisinopril; Nsaids; Penicillins; Sulfamethoxazole; Actos [pioglitazone]; Iodinated diagnostic agents; Flecainide; Gabapentin; and Losartan potassium    Social History:  The patient  reports that she quit smoking about 23 years ago. Her smoking use included Cigarettes. She has a 60.00 pack-year smoking history. She has never used smokeless tobacco. She reports that she does not drink alcohol or use drugs.   Family History:  The patient's family history includes Colon polyps in her daughter; Diabetes in her daughter and maternal grandfather; Heart disease in her father; Hypertension in her mother; Other in her mother; Thyroid disease in her daughter.    ROS:  Please see the history of present illness. All other systems are reviewed and negative.    PHYSICAL  EXAM: VS:  BP 130/78   Pulse 77   Ht 5\' 5"  (1.651 m)   Wt 233 lb 6.9 oz (105.9 kg)   SpO2 95%   BMI 38.84 kg/m  , BMI Body mass index is 38.84 kg/m. GEN: Well nourished, well developed, female in no acute distress  HEENT: normal for age  Neck: no JVD, no carotid bruit, no masses Cardiac: RRR; no murmur, no rubs, or gallops Respiratory:  clear to auscultation bilaterally, normal work of breathing GI: soft, nontender, nondistended, + BS MS: no deformity or atrophy; no edema; distal pulses are 2+ in all 4 extremities   Skin: warm and dry, no rash Neuro:  Strength and sensation are intact Psych: euthymic mood, full affect   EKG:   07/07/15  sinus rhythm with borderline first-degree AV block, PR 210 ms  08/05/15  SR rate 82  Low voltage PR 208 msec poor R wave progression      Recent Labs: 11/09/2015: Hemoglobin 10.9 05/03/2016: ALT 14; BUN 25; Creatinine, Ser 0.96; Platelets 336; Potassium 4.5; Sodium 137; TSH 4.590    Lipid Panel    Component Value Date/Time   CHOL 270 (H) 05/03/2016 1412   TRIG 152 (H) 05/03/2016 1412   HDL 69 05/03/2016 1412   CHOLHDL 3.9 05/03/2016 1412   CHOLHDL 4.7 01/27/2015 1238   VLDL 28 01/27/2015 1238   LDLCALC 171 (H) 05/03/2016 1412   LDLDIRECT 178 (H) 05/12/2015 1229     Wt Readings from Last 3 Encounters:  06/14/16 233 lb 6.9 oz (105.9 kg)  06/05/16 234 lb (106.1 kg)  05/10/16 230 lb 3.2 oz (104.4 kg)     Other studies Reviewed: Additional studies/ records that were reviewed today include: Office notes, hospital records and testing  ASSESSMENT AND PLAN:  1.  Paroxysmal atrial fibrillation: intolerant to flecainide In NSR now favor watching for  Now can consider ablation or rythmol in future   2. Anticoagulation: CHADS2VASC=5. She was previously on Xarelto, but it was discontinued for an upper GI bleed. 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 Because of her epigastric symptoms, she had an EGD 07/06/2015. It showed no significant  abnormality, specifically no vascular ectasia. Back on NOAC  3. HTN:  Well controlled.  Continue current medications and low sodium Dash type diet.    4. Diastolic Dysfunction:  Stable needs to lose weight  5. DM:  Major issue poorly controlled  Lab Results  Component Value Date   HGBA1C 8.4 05/02/2016   Victosa was effective but she couldn't afford it Januvia ineffective Refuses insulin f/u [place] And continue metformin, glipizide and Trulicity with vegan diet   6. Chest Pain Atypical given DM will order f/u myovue   [name]

## 2016-06-14 ENCOUNTER — Encounter: Payer: Self-pay | Admitting: Cardiovascular Disease

## 2016-06-14 ENCOUNTER — Ambulatory Visit (INDEPENDENT_AMBULATORY_CARE_PROVIDER_SITE_OTHER): Payer: Medicare Other | Admitting: Cardiovascular Disease

## 2016-06-14 VITALS — BP 130/78 | HR 77 | Ht 65.0 in | Wt 233.4 lb

## 2016-06-14 DIAGNOSIS — R002 Palpitations: Secondary | ICD-10-CM | POA: Diagnosis not present

## 2016-06-14 DIAGNOSIS — I480 Paroxysmal atrial fibrillation: Secondary | ICD-10-CM | POA: Diagnosis not present

## 2016-06-14 DIAGNOSIS — I2510 Atherosclerotic heart disease of native coronary artery without angina pectoris: Secondary | ICD-10-CM

## 2016-06-14 DIAGNOSIS — I10 Essential (primary) hypertension: Secondary | ICD-10-CM | POA: Diagnosis not present

## 2016-06-14 NOTE — Patient Instructions (Addendum)
Medication Instructions:  Your physician recommends that you continue on your current medications as directed. Please refer to the Current Medication list given to you today.  Labwork: NONE  Testing/Procedures Your physician has requested that you have a lexiscan myoview. For further information please visit [email or web address]. Please follow instruction sheet, as given.  Follow-Up: Your physician wants you to follow-up in: 6 months with Dr. Nishan. You will receive a reminder letter in the mail two months in advance. If you don't receive a letter, please call our office to schedule the follow-up appointment.   If you need a refill on your cardiac medications before your next appointment, please call your pharmacy.    

## 2016-06-21 ENCOUNTER — Encounter: Payer: Self-pay | Admitting: Pharmacist

## 2016-06-21 ENCOUNTER — Ambulatory Visit (INDEPENDENT_AMBULATORY_CARE_PROVIDER_SITE_OTHER): Payer: Medicare Other | Admitting: Pharmacist

## 2016-06-21 DIAGNOSIS — I2510 Atherosclerotic heart disease of native coronary artery without angina pectoris: Secondary | ICD-10-CM

## 2016-06-21 DIAGNOSIS — E1122 Type 2 diabetes mellitus with diabetic chronic kidney disease: Secondary | ICD-10-CM | POA: Diagnosis not present

## 2016-06-21 DIAGNOSIS — I10 Essential (primary) hypertension: Secondary | ICD-10-CM

## 2016-06-21 MED ORDER — LIRAGLUTIDE 18 MG/3ML ~~LOC~~ SOPN: 1.2000 mg | PEN_INJECTOR | Freq: Every day | SUBCUTANEOUS | 1 refills | Status: DC

## 2016-06-21 MED ORDER — TERAZOSIN HCL 1 MG PO CAPS: 1.0000 mg | ORAL_CAPSULE | Freq: Every day | ORAL | 1 refills | Status: DC

## 2016-06-21 MED ORDER — NITROGLYCERIN 0.4 MG SL SUBL: 0.4000 mg | SUBLINGUAL_TABLET | SUBLINGUAL | 1 refills | Status: DC | PRN

## 2016-06-21 NOTE — Progress Notes (Signed)
S:     Chief Complaint  Patient presents with  . Medication Management    Hyperlipidemia, Diabetes    Patient arrives in good spirits ambulating without assistance.  Presents for hyperlipidemia evaluation, education, and management at the request of Dr Andria Frames. Patient was referred on 05/02/16.  Patient was last seen by Primary Care Provider on 05/02/16 and last seen in pharmacy clinic on 05/10/16.   Patient reports adherence with medications.   Hyperlipidemia She reports she does have some minor cramping for a couple days after taking Rosuvastatin 5 mg weekly but states this is tolerable for her.  It has improved since the initial dose but does still persist.    Current lipid medications include: Rosuvastatin 5 mg weekly  Previously tried medications for hyperlipidemia: Pravastatin (various dosing 10-40 mg daily and every other day), rosuvastatin 10 mg daily, atorvastatin (unknown dose) - cramping in lower legs/feet which resolved upon discontinuation)     Hypertension/CAD She has decreased HCTZ to twice weekly and has not needed colchicine for a gout attack.  Has not required PRN Diltiazem in a few weeks.   Does not have NTG on her due to NTG being out of date.   Reports SBP at home run in 140-170s.    Diabetes Reports most CBGs in 100s.  Does not have meter today.  Patient denies hypoglycemic events. She states she is in the donut hole currently and Trulicity is ~$825.  She states Victoza was much cheaper last time she was in the donut hole and would like to trial this.   O:  Physical Exam  Musculoskeletal: She exhibits edema.  Vitals reviewed.  Trace Left LEE 1+ Right LEE Review of Systems  Unable to perform ROS: Other   Lab Results  Component Value Date   HGBA1C 8.4 05/02/2016   Lipid Panel     Component Value Date/Time   CHOL 270 (H) 05/03/2016 1412   TRIG 152 (H) 05/03/2016 1412   HDL 69 05/03/2016 1412   CHOLHDL 3.9 05/03/2016 1412   CHOLHDL 4.7 01/27/2015  1238   VLDL 28 01/27/2015 1238   LDLCALC 171 (H) 05/03/2016 1412   LDLDIRECT 178 (H) 05/12/2015 1229    Vitals:   06/21/16 0957  BP: (!) 152/84  Pulse: 79   A/P: Diabetes longstanding diagnosed currently uncontrolled. Patient denies hypoglycemic events and is able to verbalize appropriate hypoglycemia management plan. Patient reports adherence with medication. Control is suboptimal due to insulin resistance. -Patient currently in donut hole with copay for Truliticy ~$700.  Income above manufacturer patient assistance limits and patient reports Victoza was ~$200 cheaper the last time she was in the donut hole.   -Will send in rx for Victoza.  If cheaper than Trulicity in the donut hole then start Victoza 1.2 mg daily x 1 week then increase to 1.8 mg daily (start on day next Trulicity dose is due) Next A1C anticipated 07/2016.    ASCVD risk greater than 7.5% with LDL above goal at 171. Continued Aspirin 81 mg and Continued rosuvastatin 5 mg weekly.  Patient does have slight cramping with rosuvastatin at this dose so will not increase at this time.  Obtain lipid panel at next visit.  Consider addition of Zetia at this time pending LDL. Refilled NTG due to expired prescription.   Hypertension longstanding diagnosed currently uncontrolled.  Patient reports adherence with medication. Control is suboptimal due to decreased HCTZ due to gout, and allergy to ACEI/ARB. Started Terazosin 1 mg QHS.  Discussed monitoring for orthostasis and dizziness.  Consider transition from metoprolol to carvedilol at future visit if blood pressure remains elevated.   Written patient instructions provided.  Total time in face to face counseling 50 minutes.   Follow up in Pharmacist Clinic Visit in 1 month.   Patient seen with Ida Rogue, PharmD Candidate, and Bennye Alm, PharmD, BCPS, PGY2 Resident.

## 2016-06-21 NOTE — Assessment & Plan Note (Signed)
Hypertension longstanding diagnosed currently uncontrolled.  Patient reports adherence with medication. Control is suboptimal due to decreased HCTZ due to gout, and allergy to ACEI/ARB. Started Terazosin 1 mg QHS.  Discussed monitoring for orthostasis and dizziness.  Consider transition from metoprolol to carvedilol at future visit if blood pressure remains elevated.

## 2016-06-21 NOTE — Patient Instructions (Addendum)
We have sent in Victoza to see if it is cheaper while you are in the donut hole. -If it is cheaper then you may start at Victoza 1.2 mg daily for 1 week then increase to 1.8 mg daily.  Please start this on the day which you would take the next Trulicity dose.    Start Terazosin 1 mg daily at bedtime.  Please watch for dizziness or feeling lightheaded, especially when standing  Followup with Dr Valentina Lucks in 1 month.

## 2016-06-21 NOTE — Assessment & Plan Note (Signed)
Diabetes longstanding diagnosed currently uncontrolled. Patient denies hypoglycemic events and is able to verbalize appropriate hypoglycemia management plan. Patient reports adherence with medication. Control is suboptimal due to insulin resistance. -Patient currently in donut hole with copay for Truliticy ~$700.  Income above manufacturer patient assistance limits and patient reports Victoza was ~$200 cheaper the last time she was in the donut hole.   -Will send in rx for Victoza.  If cheaper than Trulicity in the donut hole then start Victoza 1.2 mg daily x 1 week then increase to 1.8 mg daily (start on day next Trulicity dose is due) Next A1C anticipated 07/2016.

## 2016-06-25 NOTE — Progress Notes (Signed)
Patient ID: Courtney Pena, female   DOB: Oct 05, 1940, 76 y.o.   MRN: [id number] Reviewed: Agree with Dr. Graylin Shiver documentation and management.

## 2016-06-27 ENCOUNTER — Telehealth (HOSPITAL_COMMUNITY): Payer: Self-pay | Admitting: *Deleted

## 2016-06-27 NOTE — Telephone Encounter (Signed)
Left message on voicemail per DPR in reference to upcoming appointment scheduled on  06/29/16 with detailed instructions given per Myocardial Perfusion Study Information Sheet for the test. LM to arrive 15 minutes early, and that it is imperative to arrive on time for appointment to keep from having the test rescheduled. If you need to cancel or reschedule your appointment, please call the office within 24 hours of your appointment. Failure to do so may result in a cancellation of your appointment, and a $50 no show fee. Phone number given for call back for any questions. Kirstie Peri

## 2016-06-29 ENCOUNTER — Ambulatory Visit (HOSPITAL_COMMUNITY): Payer: Medicare Other | Attending: Cardiology

## 2016-06-29 ENCOUNTER — Encounter (HOSPITAL_COMMUNITY): Payer: Self-pay

## 2016-06-29 DIAGNOSIS — I2510 Atherosclerotic heart disease of native coronary artery without angina pectoris: Secondary | ICD-10-CM | POA: Diagnosis not present

## 2016-06-29 DIAGNOSIS — I10 Essential (primary) hypertension: Secondary | ICD-10-CM

## 2016-06-29 DIAGNOSIS — E119 Type 2 diabetes mellitus without complications: Secondary | ICD-10-CM | POA: Insufficient documentation

## 2016-06-29 DIAGNOSIS — R002 Palpitations: Secondary | ICD-10-CM | POA: Diagnosis present

## 2016-06-29 DIAGNOSIS — I4891 Unspecified atrial fibrillation: Secondary | ICD-10-CM | POA: Diagnosis not present

## 2016-06-29 DIAGNOSIS — E785 Hyperlipidemia, unspecified: Secondary | ICD-10-CM | POA: Insufficient documentation

## 2016-06-29 MED ORDER — REGADENOSON 0.4 MG/5ML IV SOLN
0.4000 mg | Freq: Once | INTRAVENOUS | Status: AC
  Administered 2016-06-29: 0.4 mg via INTRAVENOUS

## 2016-06-29 MED ORDER — TECHNETIUM TC 99M TETROFOSMIN IV KIT
32.4000 | PACK | Freq: Once | INTRAVENOUS | Status: AC | PRN
  Administered 2016-06-29: 32.4 via INTRAVENOUS
  Filled 2016-06-29: qty 33

## 2016-07-02 ENCOUNTER — Ambulatory Visit (HOSPITAL_COMMUNITY): Payer: Medicare Other | Attending: Cardiology

## 2016-07-02 LAB — MYOCARDIAL PERFUSION IMAGING
LV dias vol: 117 mL (ref 46–106)
LV sys vol: 47 mL
Peak HR: 88 {beats}/min
RATE: 0.28
Rest HR: 69 {beats}/min
SDS: 1
SRS: 1
SSS: 2
TID: 1.13

## 2016-07-02 MED ORDER — TECHNETIUM TC 99M TETROFOSMIN IV KIT
30.0000 | PACK | Freq: Once | INTRAVENOUS | Status: AC | PRN
Start: 2016-07-02 — End: 2016-07-02
  Administered 2016-07-02: 30 via INTRAVENOUS
  Filled 2016-07-02: qty 30

## 2016-07-09 ENCOUNTER — Ambulatory Visit (HOSPITAL_COMMUNITY)
Admission: RE | Admit: 2016-07-09 | Discharge: 2016-07-09 | Disposition: A | Discharge status: Home / Self Care | Payer: Medicare Other | Source: Ambulatory Visit | Attending: Nurse Practitioner | Admitting: Nurse Practitioner

## 2016-07-09 ENCOUNTER — Encounter (HOSPITAL_COMMUNITY): Payer: Self-pay | Admitting: Nurse Practitioner

## 2016-07-09 VITALS — BP 126/72 | HR 80 | Ht 65.0 in | Wt 236.8 lb

## 2016-07-09 DIAGNOSIS — Z833 Family history of diabetes mellitus: Secondary | ICD-10-CM | POA: Insufficient documentation

## 2016-07-09 DIAGNOSIS — Z7901 Long term (current) use of anticoagulants: Secondary | ICD-10-CM | POA: Insufficient documentation

## 2016-07-09 DIAGNOSIS — Z79899 Other long term (current) drug therapy: Secondary | ICD-10-CM | POA: Insufficient documentation

## 2016-07-09 DIAGNOSIS — E1122 Type 2 diabetes mellitus with diabetic chronic kidney disease: Secondary | ICD-10-CM | POA: Diagnosis not present

## 2016-07-09 DIAGNOSIS — Z9889 Other specified postprocedural states: Secondary | ICD-10-CM | POA: Insufficient documentation

## 2016-07-09 DIAGNOSIS — G4730 Sleep apnea, unspecified: Secondary | ICD-10-CM | POA: Insufficient documentation

## 2016-07-09 DIAGNOSIS — J45909 Unspecified asthma, uncomplicated: Secondary | ICD-10-CM | POA: Diagnosis not present

## 2016-07-09 DIAGNOSIS — K644 Residual hemorrhoidal skin tags: Secondary | ICD-10-CM | POA: Diagnosis not present

## 2016-07-09 DIAGNOSIS — Z880 Allergy status to penicillin: Secondary | ICD-10-CM | POA: Insufficient documentation

## 2016-07-09 DIAGNOSIS — Z888 Allergy status to other drugs, medicaments and biological substances status: Secondary | ICD-10-CM | POA: Diagnosis not present

## 2016-07-09 DIAGNOSIS — K449 Diaphragmatic hernia without obstruction or gangrene: Secondary | ICD-10-CM | POA: Insufficient documentation

## 2016-07-09 DIAGNOSIS — I4892 Unspecified atrial flutter: Secondary | ICD-10-CM | POA: Insufficient documentation

## 2016-07-09 DIAGNOSIS — D120 Benign neoplasm of cecum: Secondary | ICD-10-CM | POA: Insufficient documentation

## 2016-07-09 DIAGNOSIS — E039 Hypothyroidism, unspecified: Secondary | ICD-10-CM | POA: Diagnosis not present

## 2016-07-09 DIAGNOSIS — Z87891 Personal history of nicotine dependence: Secondary | ICD-10-CM | POA: Insufficient documentation

## 2016-07-09 DIAGNOSIS — E785 Hyperlipidemia, unspecified: Secondary | ICD-10-CM | POA: Insufficient documentation

## 2016-07-09 DIAGNOSIS — E669 Obesity, unspecified: Secondary | ICD-10-CM | POA: Diagnosis not present

## 2016-07-09 DIAGNOSIS — I129 Hypertensive chronic kidney disease with stage 1 through stage 4 chronic kidney disease, or unspecified chronic kidney disease: Secondary | ICD-10-CM | POA: Insufficient documentation

## 2016-07-09 DIAGNOSIS — I480 Paroxysmal atrial fibrillation: Secondary | ICD-10-CM | POA: Diagnosis not present

## 2016-07-09 DIAGNOSIS — Z8371 Family history of colonic polyps: Secondary | ICD-10-CM | POA: Insufficient documentation

## 2016-07-09 DIAGNOSIS — N189 Chronic kidney disease, unspecified: Secondary | ICD-10-CM | POA: Diagnosis not present

## 2016-07-09 DIAGNOSIS — Z8249 Family history of ischemic heart disease and other diseases of the circulatory system: Secondary | ICD-10-CM | POA: Insufficient documentation

## 2016-07-09 DIAGNOSIS — I2510 Atherosclerotic heart disease of native coronary artery without angina pectoris: Secondary | ICD-10-CM | POA: Diagnosis not present

## 2016-07-09 DIAGNOSIS — Z794 Long term (current) use of insulin: Secondary | ICD-10-CM | POA: Insufficient documentation

## 2016-07-09 DIAGNOSIS — K219 Gastro-esophageal reflux disease without esophagitis: Secondary | ICD-10-CM | POA: Insufficient documentation

## 2016-07-09 DIAGNOSIS — Z800 Family history of malignant neoplasm of digestive organs: Secondary | ICD-10-CM | POA: Insufficient documentation

## 2016-07-09 NOTE — Progress Notes (Signed)
Patient ID: Courtney Pena, female   DOB: 10-09-1940, 76 y.o.   MRN: [id number]     Primary Care Physician: [name], MD Referring Physician: Dr. Caryl Never Hascall is a 76 y.o. 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 Johnsie Cancel and being seen 7/7 in afib clinic.Marland Kitchen  Other than a few intermittent flutters, she has not noticed any sustained afib. Tolerating flecainide well.Tolerating  Xarelto, no bleeding issue, but is in do-nut hole and will help fill out assistance forms today to see if she will qualify for assistance. She did have a f/u ETT, for which she did not stay on the treadmill for a minute but no EKG changes were noted with exertion.  Returns to afib clinic 8/17, for c/o blurred vision. She states that she has noted vision change since starting flecainide but thought it would wear off. She stopped flecainide yesterday and vision is already better. She is in SR today and since starting flecainide, afib burden has been low. She also stopped losartan because she retained fluid with previous use.   Her PCP wanted her to try one more time to see if the S.E. occurred again and it did. Will f/u with PCP for BP.  F/u in afib clinc 12/11. She is off flecainide. She saw Dr. Rayann Heman in August and was given options for ablation or Rythmol. She decide that she would not make any changes. She feels that she has not noted the irregularity related with afib but has noted a different irregularity. She had pac's on her EKG the other day and what she describes fits this pattern.   She asked to be seen in afib clinic today with a few concerns. She describes seconds to minutes of a sensation that will start in her stomach  and spread to her chest, feels a flutter and a pin prick. This may happen 2x a day. Usually at rest. She wanted to  report these symptoms but does not feel they are bad enough to change her approach. She had a stress test in 2017 which was low risk.  F/u in afib clinic 5/14 for increase in afib burden. She will note 2x a week and she will return to SR within one hour with taking 30 mg cardizem.. She had been on flecainide in the past but did not tolerate. She would like to discuss  with Dr. Rayann Heman pursuing ablation. She did see him last August but was not ready to commit at that time.   Today, she denies symptoms of palpitations, chest pain, shortness of breath, orthopnea, PND, lower extremity edema, dizziness, presyncope, syncope, or neurologic sequela. The patient is tolerating medications without difficulties and is otherwise without complaint today.   Past Medical History:  Diagnosis Date  . Allergy   . Anemia due to GI blood loss 08/12/2011  . Arthritis   . Asthma   . Atrial flutter ([place]) 12-07-2010   converted in ED with 300 mg flecainide  . CAD (coronary artery disease)    a. mild per cath in 2004;  b. nonischemic Myoview in March 2012;  c. Lex MV 1/14:  EF 66%, no ischemia  . Chronic anticoagulation - Xarelto started 07/07/2015, CHADS2CVASC=5 07/07/2015  . External hemorrhoids 06/07/2010  . Gastric antral vascular ectasia    source for gi bleed in 07/2011 - Xarelto stopped  . Gastritis   .  GERD (gastroesophageal reflux disease)   . Hiatal hernia   . Hyperlipidemia   . Hypertension   . Hypothyroidism   . Neuromuscular disorder ([place])   . Obesity   . Paroxysmal atrial fibrillation (H[facility])   . Personal history of colonic polyps 06/06/2009   cecal polyp  . Sleep apnea    does not use CPAP  . Type 2 diabetes mellitus with diabetic chronic kidney disease ([place]) 04/25/2006      . Ulcer    Past Surgical History:  Procedure Laterality Date  . BREAST BIOPSY Left   . CARDIAC CATHETERIZATION  1999&2004  . CARPAL TUNNEL RELEASE Bilateral 2003  . CATARACT EXTRACTION Bilateral   . COLONOSCOPY    .  ESOPHAGOGASTRODUODENOSCOPY  08/13/2011   Procedure: ESOPHAGOGASTRODUODENOSCOPY (EGD);  Surgeon: Gatha Mayer, MD;  Location: [facility] ENDOSCOPY;  Service: Endoscopy;  Laterality: N/A;  . POLYPECTOMY      Current Outpatient Prescriptions  Medication Sig Dispense Refill  . Cholecalciferol (VITAMIN D3) 2000 units TABS Take 1 capsule by mouth daily.     . clobetasol cream (TEMOVATE) 8.29 % Apply 1 application topically as needed.     . Colchicine 0.6 MG CAPS Take 1 tablet by mouth daily as needed (gout/pain). Reported on 09/02/2015    . diltiazem (CARDIZEM CD) 360 MG 24 hr capsule TAKE 1 CAPSULE BY MOUTH  DAILY 90 capsule 3  . diltiazem (CARDIZEM) 30 MG tablet Take 1 tablet every 4 hours AS NEEDED for heart rate >100 as long as blood pressure >100. 45 tablet 3  . Dulaglutide (TRULICITY) 1.5 FA/2.1HY SOPN Inject 1.5 mg under skin every week 12 pen 3  . glipiZIDE (GLUCOTROL XL) 10 MG 24 hr tablet Take 1 tablet (10 mg total) by mouth daily with breakfast. 180 tablet 3  . hydrochlorothiazide (HYDRODIURIL) 12.5 MG tablet Take 1 tablet (12.5 mg total) by mouth daily. (Patient taking differently: Take 12.5 mg by mouth 2 (two) times a week. ) 90 tablet 3  . Insulin Pen Needle 32G X 4 MM MISC Use 1x a day 100 each 3  . levothyroxine (SYNTHROID, LEVOTHROID) 112 MCG tablet Take 1 tablet (112 mcg total) by mouth daily. 90 tablet 3  . liraglutide (VICTOZA) 18 MG/3ML SOPN Inject 0.2-0.3 mLs (1.2-1.8 mg total) into the skin daily. 9 pen 1  . metFORMIN (GLUCOPHAGE-XR) 500 MG 24 hr tablet Take 4 tablets (2,000 mg total) by mouth daily with breakfast. 360 tablet 3  . metoprolol succinate (TOPROL-XL) 100 MG 24 hr tablet TAKE 1 TABLET BY MOUTH  DAILY WITH OR IMMEDIATELY  FOLLOWING A MEAL 90 tablet 3  . nitroGLYCERIN (NITROSTAT) 0.4 MG SL tablet Place 1 tablet (0.4 mg total) under the tongue every 5 (five) minutes as needed for chest pain. 25 tablet 1  . ONE TOUCH ULTRA TEST test strip TEST 3 TIMES DAILY 300 each 3  .  rivaroxaban (XARELTO) 20 MG TABS tablet Take 1 tablet (20 mg total) by mouth daily with supper. 90 tablet 3  . rosuvastatin (CRESTOR) 5 MG tablet Take 1 tablet (5 mg total) by mouth daily at 6 PM. Start once weekly.  Plan to increase as tolerated. (Patient taking differently: Take 5 mg by mouth once a week. Start once weekly.  Plan to increase as tolerated.) 30 tablet 0  . terazosin (HYTRIN) 1 MG capsule Take 1 capsule (1 mg total) by mouth at bedtime. 30 capsule 1  . traMADol (ULTRAM) 50 MG tablet Take 50 mg by mouth every 6 (six)  hours as needed for moderate pain or severe pain.     No current facility-administered medications for this encounter.     Allergies  Allergen Reactions  . Lisinopril Swelling    REACTION: swelling, may have had some breathing involvement.   . Nsaids Other (See Comments)    Patient reports internal bleeding  . Penicillins Shortness Of Breath and Swelling    Arm Swelling with Penicillin (Occurred in 1960s) Breathing - throat swelling with Amoxicillin (Occurred prior to 2002)  . Sulfamethoxazole Hives and Itching    "welps all over" immediately after dose  . Actos [Pioglitazone] Swelling    REACTION: swelling all over body  . Flecainide Other (See Comments)    Blurry  vision  . Gabapentin Other (See Comments)    Caused dysphora  . Iodinated Diagnostic Agents Itching    Pt. Developed mild itching after receiving IV cm; pt. Held; Dr. Keane Scrape recomended she take 50 mg of benadryl when she goes home-if necessary; Dr Mickey Farber recommends benadryl prior to future exams requiring contrast media, but stated other doctors may recommend another premedication prep.  . Losartan Potassium Other (See Comments)    Lower extremity swelling    Social History   Social History  . Marital status: Widowed    Spouse name: Iona Beard  . Number of children: 4  . Years of education: some colle   Occupational History  . Management Unemployed   Social History Main Topics  .  Smoking status: Former Smoker    Packs/day: 2.00    Years: 30.00    Types: Cigarettes    Quit date: 02/26/1993  . Smokeless tobacco: Never Used  . Alcohol use No  . Drug use: No  . Sexual activity: Not Currently   Other Topics Concern  . Not on file   Social History Narrative  . No narrative on file    Family History  Problem Relation Age of Onset  . Heart disease Father   . Diabetes Maternal Grandfather   . Hypertension Mother   . Other Mother        brain tumor-benign  . Diabetes Daughter        pre-diabeties  . Colon polyps Daughter   . Thyroid disease Daughter        x 2  . Colon cancer Neg Hx     ROS- All systems are reviewed and negative except as per the HPI above  Physical Exam: Vitals:   07/09/16 1412  BP: 126/72  Pulse: 80  Weight: 236 lb 12.8 oz (107.4 kg)  Height: 5\' 5"  (1.651 m)    GEN- The patient is well appearing, alert and oriented x 3 today.   Head- normocephalic, atraumatic Eyes-  Sclera clear, conjunctiva pink Ears- hearing intact Oropharynx- clear Neck- supple, no JVP Lymph- no cervical lymphadenopathy Lungs- Clear to ausculation bilaterally, normal work of breathing Heart- Regular rate and rhythm, no murmurs, rubs or gallops, PMI not laterally displaced GI- soft, NT, ND, + BS Extremities- no clubbing, cyanosis, or edema MS- no significant deformity or atrophy Skin- no rash or lesion Psych- euthymic mood, full affect Neuro- strength and sensation are intact  EKG-SR with first degree AVB, pr int 212 ms, qrs int 88 ms, qtc 444 ms Epic records reviewed  Stress myoview 2/8-Nuclear stress EF: 67%. Mild hypokinesis of basal septum.  There was no ST segment deviation noted during stress.  Defect 1: There is a small defect of mild severity present in the apex location. Consistent with  breast attenuation. No ischemia. This is a low risk study. Sensitivity and specificity reduced secondary to body habitus, bowel loop  attenuation.   Assessment and Plan: 1. Symptomatic paroxysmal  AFIb Describes a few episodes a week Discussed options of  Rythmol or ablation as offered by Dr. Rayann Heman and she would like to pursue ablation Continue xarelto for chadsvasc score of at least 6  Continue dilt/BB  or breakthrough afib, cardizem 30 mg as needed  2. HTN Stable  Avoid salt  3. Obesity Exercise, weight loss encouraged  4. DM Pt is working on better glycemic control  Appointment requested with Dr. Rayann Heman to discuss ablation  Butch Penny C. Chaunta Bejarano, [facility] [address] [place], [place] [address] [phone]

## 2016-07-10 ENCOUNTER — Other Ambulatory Visit: Payer: Self-pay | Admitting: Family Medicine

## 2016-07-16 ENCOUNTER — Other Ambulatory Visit: Payer: Self-pay | Admitting: *Deleted

## 2016-07-16 DIAGNOSIS — E1122 Type 2 diabetes mellitus with diabetic chronic kidney disease: Secondary | ICD-10-CM

## 2016-07-16 MED ORDER — LIRAGLUTIDE 18 MG/3ML ~~LOC~~ SOPN: 1.2000 mg | PEN_INJECTOR | Freq: Every day | SUBCUTANEOUS | 1 refills | Status: DC

## 2016-07-17 ENCOUNTER — Other Ambulatory Visit: Payer: Self-pay | Admitting: Physician Assistant

## 2016-07-17 NOTE — Telephone Encounter (Signed)
Pt last saw Dr Johnsie Cancel 06/14/16, last labs 05/03/16 Creat 0.96, age 76, weight 107.4kg, based on pt's CrCl 84.53 pt is on appropriate dosage of Xarelto 20mg  QD.  Will refill rx.

## 2016-07-18 ENCOUNTER — Telehealth: Payer: Self-pay

## 2016-07-18 NOTE — Telephone Encounter (Signed)
Called patient and checked on what medications she was taking, patient is now taking the Trulicity med. I added to med list.

## 2016-07-20 ENCOUNTER — Ambulatory Visit: Payer: Medicare Other | Admitting: Pharmacist

## 2016-08-06 ENCOUNTER — Ambulatory Visit: Payer: Medicare Other | Admitting: Internal Medicine

## 2016-08-13 ENCOUNTER — Encounter: Payer: Self-pay | Admitting: Internal Medicine

## 2016-08-13 ENCOUNTER — Ambulatory Visit (INDEPENDENT_AMBULATORY_CARE_PROVIDER_SITE_OTHER): Payer: Medicare Other | Admitting: Internal Medicine

## 2016-08-13 VITALS — BP 138/82 | HR 76 | Ht 66.0 in | Wt 233.0 lb

## 2016-08-13 DIAGNOSIS — I480 Paroxysmal atrial fibrillation: Secondary | ICD-10-CM

## 2016-08-13 DIAGNOSIS — G4730 Sleep apnea, unspecified: Secondary | ICD-10-CM

## 2016-08-13 NOTE — Patient Instructions (Addendum)
Medication Instructions:  Your physician recommends that you continue on your current medications as directed. Please refer to the Current Medication list given to you today.   Labwork: None ordered   Testing/Procedures: None ordered   Follow-Up: Your physician recommends that you schedule a follow-up appointment in: 2 months with Roderic Palau, NP  Please see Dr Erik Obey for your sleep apnea--will put in a referral as it has been several years   Any Other Special Instructions Will Be Listed Below (If Applicable).     If you need a refill on your cardiac medications before your next appointment, please call your pharmacy.

## 2016-08-13 NOTE — Progress Notes (Signed)
Electrophysiology Office Note   Date:  08/13/2016   ID:  Courtney Pena, DOB 1940/09/04, MRN [id number]  PCP:  [name], MD  Cardiologist:  Dr Johnsie Cancel Primary Electrophysiologist: Thompson Grayer, MD    Chief Complaint  Patient presents with  . Atrial Fibrillation     History of Present Illness: Courtney Pena is a 76 y.o. female who presents today for electrophysiology evaluation.   She continues to have afib.  She did not tolerate flecainide due to dizziness, visual changes.  When I saw her last, I advised that she see Dr Erik Obey for follow-up on OSA and to consider treatment.  She did not do this.  She is intermittently compliant with medicines.  She has made some improvements in her weight.   afib occurs about twice per month, lasting several hours.  Today, she denies symptoms of palpitations, chest pain, shortness of breath, orthopnea, PND, lower extremity edema, claudication, dizziness, presyncope, syncope, bleeding, or neurologic sequela. The patient is tolerating medications without difficulties and is otherwise without complaint today.    Past Medical History:  Diagnosis Date  . Allergy   . Anemia due to GI blood loss 08/12/2011  . Arthritis   . Asthma   . Atrial flutter ([place]) 12-07-2010   converted in ED with 300 mg flecainide  . CAD (coronary artery disease)    a. mild per cath in 2004;  b. nonischemic Myoview in March 2012;  c. Lex MV 1/14:  EF 66%, no ischemia  . Chronic anticoagulation - Xarelto started 07/07/2015, CHADS2CVASC=5 07/07/2015  . External hemorrhoids 06/07/2010  . Gastric antral vascular ectasia    source for gi bleed in 07/2011 - Xarelto stopped  . Gastritis   . GERD (gastroesophageal reflux disease)   . Hiatal hernia   . Hyperlipidemia   . Hypertension   . Hypothyroidism   . Neuromuscular disorder ([place])   . Obesity   . Paroxysmal atrial fibrillation (HCC)   . Personal history of colonic polyps 06/06/2009   cecal polyp  .  Sleep apnea    does not use CPAP  . Type 2 diabetes mellitus with diabetic chronic kidney disease ([place]) 04/25/2006      . Ulcer    Past Surgical History:  Procedure Laterality Date  . BREAST BIOPSY Left   . CARDIAC CATHETERIZATION  1999&2004  . CARPAL TUNNEL RELEASE Bilateral 2003  . CATARACT EXTRACTION Bilateral   . COLONOSCOPY    . ESOPHAGOGASTRODUODENOSCOPY  08/13/2011   Procedure: ESOPHAGOGASTRODUODENOSCOPY (EGD);  Surgeon: Gatha Mayer, MD;  Location[facility] ENDOSCOPY;  Service: Endoscopy;  Laterality: N/A;  . POLYPECTOMY       Current Outpatient Prescriptions  Medication Sig Dispense Refill  . Cholecalciferol (VITAMIN D3) 2000 units TABS Take 1 capsule by mouth daily.     . clobetasol cream (TEMOVATE) 1.49 % Apply 1 application topically as needed (Use as directed).     . Colchicine 0.6 MG CAPS Take 1 tablet by mouth daily as needed (gout/pain). Reported on 09/02/2015    . diltiazem (CARDIZEM CD) 360 MG 24 hr capsule TAKE 1 CAPSULE BY MOUTH  DAILY 90 capsule 3  . diltiazem (CARDIZEM) 30 MG tablet Take 1 tablet by mouth every 4 hours AS NEEDED for heart rate >100 as long as blood pressure >100.    . Dulaglutide (TRULICITY) 1.5 FW/2.6VZ SOPN Inject 1.5 mg into the skin as directed.     Marland Kitchen glipiZIDE (GLUCOTROL XL) 10 MG 24 hr tablet Take 1 tablet (10 mg  total) by mouth daily with breakfast. 180 tablet 3  . hydrochlorothiazide (HYDRODIURIL) 12.5 MG tablet Take 12.5 mg by mouth daily as needed (swelling/fluid retention).    . Insulin Pen Needle 32G X 4 MM MISC Use 1x a day 100 each 3  . levothyroxine (SYNTHROID, LEVOTHROID) 112 MCG tablet Take 1 tablet (112 mcg total) by mouth daily. 90 tablet 3  . metFORMIN (GLUCOPHAGE-XR) 500 MG 24 hr tablet Take 4 tablets (2,000 mg total) by mouth daily with breakfast. 360 tablet 3  . metoprolol succinate (TOPROL-XL) 100 MG 24 hr tablet TAKE 1 TABLET BY MOUTH  DAILY WITH OR IMMEDIATELY  FOLLOWING A MEAL 90 tablet 3  . nitroGLYCERIN (NITROSTAT) 0.4 MG SL  tablet Place 1 tablet (0.4 mg total) under the tongue every 5 (five) minutes as needed for chest pain. 25 tablet 1  . ONE TOUCH ULTRA TEST test strip TEST 3 TIMES DAILY 300 each 3  . rosuvastatin (CRESTOR) 5 MG tablet Take 1 tablet (5 mg total) by mouth daily at 6 PM. Start once weekly.  Plan to increase as tolerated. 30 tablet 0  . traMADol (ULTRAM) 50 MG tablet Take 50 mg by mouth every 6 (six) hours as needed for moderate pain or severe pain.    Marland Kitchen XARELTO 20 MG TABS tablet TAKE 1 TABLET (20 MG TOTAL) BY MOUTH DAILY WITH SUPPER. 30 tablet 11   No current facility-administered medications for this visit.     Allergies:   Lisinopril; Nsaids; Penicillins; Sulfamethoxazole; Actos [pioglitazone]; Flecainide; Gabapentin; Iodinated diagnostic agents; and Losartan potassium   Social History:  The patient  reports that she quit smoking about 23 years ago. Her smoking use included Cigarettes. She has a 60.00 pack-year smoking history. She has never used smokeless tobacco. She reports that she does not drink alcohol or use drugs.   Family History:  The patient's  family history includes Colon polyps in her daughter; Diabetes in her daughter and maternal grandfather; Heart disease in her father; Hypertension in her mother; Other in her mother; Thyroid disease in her daughter.    ROS:  Please see the history of present illness.   All other systems are personally reviewed and negative.    PHYSICAL EXAM: VS:  BP 138/82   Pulse 76   Ht 5\' 6"  (1.676 m)   Wt 233 lb (105.7 kg)   SpO2 94%   BMI 37.61 kg/m  , BMI Body mass index is 37.61 kg/m. GEN: overweight, in no acute distress  HEENT: normal  Neck: no JVD, carotid bruits, or masses Cardiac: RRR; no murmurs, rubs, or gallops,no edema  Respiratory:  clear to auscultation bilaterally, normal work of breathing GI: soft, nontender, nondistended, + BS MS: no deformity or atrophy  Skin: warm and dry  Neuro:  Strength and sensation are intact Psych:  euthymic mood, full affect  EKG:  EKG is ordered today. The ekg ordered today is personally reviewed and shows sinus rhythm PR 224 msec, Qtc 436 msec   Recent Labs: 05/03/2016: ALT 14; BUN 25; Creatinine, Ser 0.96; Hemoglobin 11.2; Platelets 336; Potassium 4.5; Sodium 137; TSH 4.590  personally reviewed   Lipid Panel     Component Value Date/Time   CHOL 270 (H) 05/03/2016 1412   TRIG 152 (H) 05/03/2016 1412   HDL 69 05/03/2016 1412   CHOLHDL 3.9 05/03/2016 1412   CHOLHDL 4.7 01/27/2015 1238   VLDL 28 01/27/2015 1238   LDLCALC 171 (H) 05/03/2016 1412   LDLDIRECT 178 (H) 05/12/2015 1229  personally reviewed   Wt Readings from Last 3 Encounters:  08/13/16 233 lb (105.7 kg)  07/09/16 236 lb 12.8 oz (107.4 kg)  06/14/16 233 lb 6.9 oz (105.9 kg)      Other studies personally reviewed: Additional studies/ records that were reviewed today include: AF clinic notes, my prior note  Review of the above records today demonstrates: as above   ASSESSMENT AND PLAN:  1.  Paroxysmal afib/ atrial flutter I worry that success with ablation is decreased in the absence of lifestyle modification.  I have strongly advised that she follow-up with Dr Erik Obey for further evaluation and management of OSA. Could consider ablation or sotalol if afib worsens  2. OSA As above  3. Obesity Body mass index is 37.61 kg/m. Lifestyle modification encouraged  4. HTN Stable No change required today  Return to see Butch Penny in the AF clinic in 3 months   Current medicines are reviewed at length with the patient today.   The patient does not have concerns regarding her medicines.  The following changes were made today:  none    Signed, Thompson Grayer, MD  08/13/2016 1:57 PM     [place] [address] [phone] (office) [phone] (fax)

## 2016-09-20 ENCOUNTER — Ambulatory Visit: Payer: Medicare Other | Admitting: Internal Medicine

## 2016-09-24 ENCOUNTER — Other Ambulatory Visit: Payer: Self-pay | Admitting: Family Medicine

## 2016-09-25 DIAGNOSIS — H9122 Sudden idiopathic hearing loss, left ear: Secondary | ICD-10-CM | POA: Insufficient documentation

## 2016-09-25 NOTE — Telephone Encounter (Signed)
Laurey Arrow Trying to figure out this refill about glipizide.  Was last filled by Cruzita Lederer?  Is on medication list as one daily.  Refill is for 2 daily.  Saw you last for diabetes. Who is managing her diabetes? Should I refuse Rx and send back to [facility]?  Thanks  Truman Hayward

## 2016-09-27 ENCOUNTER — Other Ambulatory Visit: Payer: Self-pay | Admitting: *Deleted

## 2016-09-28 ENCOUNTER — Other Ambulatory Visit: Payer: Self-pay | Admitting: Pharmacist

## 2016-09-28 ENCOUNTER — Telehealth: Payer: Self-pay | Admitting: Pharmacist

## 2016-09-28 MED ORDER — GLIPIZIDE ER 10 MG PO TB24
10.0000 mg | ORAL_TABLET | Freq: Every day | ORAL | 3 refills | Status: DC
Start: 2016-09-28 — End: 2016-10-03

## 2016-09-28 MED ORDER — GLIPIZIDE ER 10 MG PO TB24: 10.0000 mg | ORAL_TABLET | Freq: Every day | ORAL | 1 refills | Status: DC

## 2016-09-28 NOTE — Telephone Encounter (Signed)
Following conversation, patient clarified dose of Glipizide 10mg  XL once daily.  She requested Glipizide 10mg  XL once daily Rx to be sent to both her local and her mail order pharmacy.   I placed both prescriptions as patient requested.    She also asked to clarify the dosing of colchicine with Diltizem.   I clarified that she would need to discuss the use of colchicine with Dr. Andria Frames at her next visit.  We discussed the option of adding an agent to decrease gout attacks in the future to avoid the drug interaction with colchicine and diltiazem.   She will continue to use low doses of colchicine if she has a gout flare until her next visit.  Asked to schedule.

## 2016-09-28 NOTE — Telephone Encounter (Signed)
Clarified dosing of glipizide with patient.  Second phone note.

## 2016-09-28 NOTE — Telephone Encounter (Signed)
Clarified dosing of glipizide in second phone note and closed.

## 2016-10-03 ENCOUNTER — Encounter: Payer: Self-pay | Admitting: Internal Medicine

## 2016-10-03 ENCOUNTER — Ambulatory Visit (INDEPENDENT_AMBULATORY_CARE_PROVIDER_SITE_OTHER): Payer: Medicare Other | Admitting: Internal Medicine

## 2016-10-03 VITALS — BP 140/82 | HR 76 | Ht 64.5 in | Wt 233.0 lb

## 2016-10-03 DIAGNOSIS — E1122 Type 2 diabetes mellitus with diabetic chronic kidney disease: Secondary | ICD-10-CM | POA: Diagnosis not present

## 2016-10-03 DIAGNOSIS — E038 Other specified hypothyroidism: Secondary | ICD-10-CM | POA: Diagnosis not present

## 2016-10-03 LAB — POCT GLYCOSYLATED HEMOGLOBIN (HGB A1C): Hemoglobin A1C: 7.9

## 2016-10-03 MED ORDER — METFORMIN HCL ER 500 MG PO TB24: 2000.0000 mg | ORAL_TABLET | Freq: Every day | ORAL | 3 refills | Status: DC

## 2016-10-03 NOTE — Addendum Note (Signed)
Addended by: Caprice Beaver T on: 10/03/2016 10:48 AM   Modules accepted: Orders

## 2016-10-03 NOTE — Patient Instructions (Addendum)
Please continue: - Metformin ER 2000 units but move this to dinnertime  - Glipizide ER 10 mg daily in am. - Trulicity 1.5 mg weekly in am  Please return in 3-4 months with your sugar log.

## 2016-10-03 NOTE — Progress Notes (Signed)
Patient ID: Courtney Pena, female   DOB: 01/15/1941, 76 y.o.   MRN: [id number]   HPI: Courtney Pena is a 76 y.o.-year-old femaleemale, initially referred by Dr. Johnsie Cancel, returning for follow-up for DM2, dx in ~2000, insulin-dependent (but refusing insulin), uncontrolled, with complications (mild CKD, CAD, PAF). Last visit 4 months ago.  She was in the doughnut hole >> missed Trulicity x 1 mo. Now back on it.  She is vegan but occasionally falling off the wagon.  Last hemoglobin A1c was: Lab Results  Component Value Date   HGBA1C 8.4 05/02/2016   HGBA1C 10.4 02/21/2016   HGBA1C 10.1 11/09/2015   HGBA1C 9..2 06/23/2015   HGBA1C 9.0 01/27/2015   HGBA1C 9.4 07/09/2014   HGBA1C 9.7 02/12/2014   HGBA1C 9.4 09/18/2013   HGBA1C 9.8 05/27/2013   HGBA1C 8.4 12/26/2012   HGBA1C 9.4 08/22/2012   HGBA1C 9.9 05/14/2012   HGBA1C 10.6 12/26/2011   HGBA1C 8.0 09/05/2011   HGBA1C 8.2 06/15/2011   HGBA1C 8.9 02/02/2011   HGBA1C 8.6 (H) 12/07/2010   HGBA1C 8.5 11/22/2010   HGBA1C 9.4 (H) 05/02/2010   HGBA1C 8.5 04/28/2010   Pt is on a regimen of: - Metformin ER 2000 units at dinnertime- she actually takes this all in am - Glipizide ER 10 mg daily in am. - Trulicity 1.5 mg weekly. She refused insulins in the past. We tried to start in 01/2016 >> but she dropped too low (80s) >> stopped insulin after 3 days. She was on GLP1 R agonists (Victoza), but stopped 2/2 cost (doughnut hole). She was on this up until May 31, 2015. She was on Januvia >> did not help. She was on Actos >> swelling.  Pt checks her sugars 2-3x a day (forgot log): - am: 250-375 >> 197-264 >> 153-225, 251  (stress) >> 150-200 - 2h after b'fast: n/c - before lunch: n/c >> 175, 180, 200 >> n/c - 2h after lunch: n/c - before dinner: 240s >> 90-113 >> 90-130 >> 120, 128 >> 100-120 - 2h after dinner: n/c - bedtime: 175-200s >> 190-200s >> 100-130 >> 99, 104-144 >> n/c - nighttime: n/c No lows. Lowest sugar was 92 >> 90  >> 80s >> 100 Highest sugar was 409 >> 395 >> 300s >> 200s  Glucometer: OneTouch Ultra  Pt's meals are: - (-B'fast: kale + fruit smoothie) - Brunch (12 pm): sandwich or salad - Dinner: meat + veggies or salad + starch - Snacks: crackers, potato chips - pm or after dinner "I am a sugar addict" >> trying to cut the sweets out, but she had more recently On diet drinks.  - She has mild CKD, last BUN/creatinine: Lab Results  Component Value Date   BUN 25 05/03/2016   BUN 26 (H) 12/22/2015   CREATININE 0.96 05/03/2016   CREATININE 1.07 (H) 12/22/2015   - last set of lipids: Lab Results  Component Value Date   CHOL 270 (H) 05/03/2016   HDL 69 05/03/2016   LDLCALC 171 (H) 05/03/2016   LDLDIRECT 178 (H) 05/12/2015   TRIG 152 (H) 05/03/2016   CHOLHDL 3.9 05/03/2016  On Crestor. - last eye exam was in 03/2016 >> No DR. - she has numbness and tingling in her feet.  Husband passed away in 05/31/15.  She also has a history of HTN, HL, hypothyroidism. Latest TSH was slightly elevated, after which her levothyroxine dose has been adjusted up. Lab Results  Component Value Date   TSH 4.590 (H) 05/03/2016   She stopped HCTZ b/c gout  flares.   ROS: Constitutional: no weight gain/no weight loss, no fatigue, no subjective hyperthermia, no subjective hypothermia, + nocturia Eyes: no blurry vision, no xerophthalmia ENT: no sore throat, no nodules palpated in throat, no dysphagia, no odynophagia, no hoarseness Cardiovascular: no CP/no SOB/no palpitations/no leg swelling Respiratory: no cough/no SOB/no wheezing Gastrointestinal: no N/no V/no D/no C/no acid reflux Musculoskeletal: no muscle aches/+ joint aches Skin: no rashes, no hair loss Neurological: no tremors/no dizziness  I reviewed pt's medications, allergies, PMH, social hx, family hx, and changes were documented in the history of present illness. Otherwise, unchanged from my initial visit note.  Past Medical History:  Diagnosis  Date  . Allergy   . Anemia due to GI blood loss 08/12/2011  . Arthritis   . Asthma   . Atrial flutter ([place]) 12-07-2010   converted in ED with 300 mg flecainide  . CAD (coronary artery disease)    a. mild per cath in 2004;  b. nonischemic Myoview in March 2012;  c. Lex MV 1/14:  EF 66%, no ischemia  . Chronic anticoagulation - Xarelto started 07/07/2015, CHADS2CVASC=5 07/07/2015  . External hemorrhoids 06/07/2010  . Gastric antral vascular ectasia    source for gi bleed in 07/2011 - Xarelto stopped  . Gastritis   . GERD (gastroesophageal reflux disease)   . Hiatal hernia   . Hyperlipidemia   . Hypertension   . Hypothyroidism   . Neuromuscular disorder ([place])   . Obesity   . Paroxysmal atrial fibrillation (HCC)   . Personal history of colonic polyps 06/06/2009   cecal polyp  . Sleep apnea    does not use CPAP  . Type 2 diabetes mellitus with diabetic chronic kidney disease ([place]) 04/25/2006      . Ulcer    Past Surgical History:  Procedure Laterality Date  . BREAST BIOPSY Left   . CARDIAC CATHETERIZATION  1999&2004  . CARPAL TUNNEL RELEASE Bilateral 2003  . CATARACT EXTRACTION Bilateral   . COLONOSCOPY    . ESOPHAGOGASTRODUODENOSCOPY  08/13/2011   Procedure: ESOPHAGOGASTRODUODENOSCOPY (EGD);  Surgeon: Gatha Mayer, MD;  Location: [facility] ENDOSCOPY;  Service: Endoscopy;  Laterality: N/A;  . POLYPECTOMY     Social History   Social History  . Marital status: Widowed    Spouse name: Iona Beard  . Number of children: 4  . Years of education: some colle   Occupational History  . Management Unemployed   Social History Main Topics  . Smoking status: Former Smoker    Packs/day: 2.00    Years: 30.00    Types: Cigarettes    Quit date: 02/26/1993  . Smokeless tobacco: Never Used  . Alcohol use No  . Drug use: No  . Sexual activity: Not Currently   Other Topics Concern  . Not on file   Social History Narrative  . No narrative on file   Current Outpatient Prescriptions on File  Prior to Visit  Medication Sig Dispense Refill  . Cholecalciferol (VITAMIN D3) 2000 units TABS Take 1 capsule by mouth daily.     . clobetasol cream (TEMOVATE) 1.91 % Apply 1 application topically as needed (Use as directed).     . Colchicine 0.6 MG CAPS Take 1 tablet by mouth daily as needed (gout/pain). Reported on 09/02/2015    . colchicine 0.6 MG tablet Take 1 tablet by mouth 3 (three) times daily as needed.  0  . diltiazem (CARDIZEM CD) 360 MG 24 hr capsule TAKE 1 CAPSULE BY MOUTH  DAILY 90 capsule 3  .  diltiazem (CARDIZEM) 30 MG tablet Take 1 tablet by mouth every 4 hours AS NEEDED for heart rate >100 as long as blood pressure >100.    . Dulaglutide (TRULICITY) 1.5 XT/0.6YI SOPN Inject 1.5 mg into the skin as directed.     Marland Kitchen glipiZIDE (GLUCOTROL XL) 10 MG 24 hr tablet Take 1 tablet (10 mg total) by mouth daily with breakfast. 30 tablet 1  . glipiZIDE (GLUCOTROL XL) 10 MG 24 hr tablet Take 1 tablet (10 mg total) by mouth daily with breakfast. 180 tablet 3  . hydrochlorothiazide (HYDRODIURIL) 12.5 MG tablet Take 12.5 mg by mouth daily as needed (swelling/fluid retention).    . Insulin Pen Needle 32G X 4 MM MISC Use 1x a day 100 each 3  . levothyroxine (SYNTHROID, LEVOTHROID) 112 MCG tablet Take 1 tablet (112 mcg total) by mouth daily. 90 tablet 3  . metFORMIN (GLUCOPHAGE-XR) 500 MG 24 hr tablet Take 4 tablets (2,000 mg total) by mouth daily with breakfast. 360 tablet 3  . metoprolol succinate (TOPROL-XL) 100 MG 24 hr tablet TAKE 1 TABLET BY MOUTH  DAILY WITH OR IMMEDIATELY  FOLLOWING A MEAL 90 tablet 3  . nitroGLYCERIN (NITROSTAT) 0.4 MG SL tablet Place 1 tablet (0.4 mg total) under the tongue every 5 (five) minutes as needed for chest pain. 25 tablet 1  . ONE TOUCH ULTRA TEST test strip TEST 3 TIMES DAILY 300 each 3  . rosuvastatin (CRESTOR) 5 MG tablet Take 1 tablet (5 mg total) by mouth daily at 6 PM. Start once weekly.  Plan to increase as tolerated. 30 tablet 0  . traMADol (ULTRAM) 50 MG  tablet Take 50 mg by mouth every 6 (six) hours as needed for moderate pain or severe pain.    Marland Kitchen XARELTO 20 MG TABS tablet TAKE 1 TABLET (20 MG TOTAL) BY MOUTH DAILY WITH SUPPER. 30 tablet 11   No current facility-administered medications on file prior to visit.    Allergies  Allergen Reactions  . Lisinopril Swelling    REACTION: swelling, may have had some breathing involvement.   . Nsaids Other (See Comments)    Patient reports internal bleeding  . Penicillins Shortness Of Breath and Swelling    Arm Swelling with Penicillin (Occurred in 1960s) Breathing - throat swelling with Amoxicillin (Occurred prior to 2002)  . Sulfamethoxazole Hives and Itching    "welps all over" immediately after dose  . Actos [Pioglitazone] Swelling    REACTION: swelling all over body  . Flecainide Other (See Comments)    Blurry  vision  . Gabapentin Other (See Comments)    Caused dysphora  . Iodinated Diagnostic Agents Itching    Pt. Developed mild itching after receiving IV cm; pt. Held; Dr. Keane Scrape recomended she take 50 mg of benadryl when she goes home-if necessary; Dr Mickey Farber recommends benadryl prior to future exams requiring contrast media, but stated other doctors may recommend another premedication prep.  . Losartan Potassium Other (See Comments)    Lower extremity swelling   Family History  Problem Relation Age of Onset  . Heart disease Father   . Diabetes Maternal Grandfather   . Hypertension Mother   . Other Mother        brain tumor-benign  . Diabetes Daughter        pre-diabeties  . Colon polyps Daughter   . Thyroid disease Daughter        x 2  . Colon cancer Neg Hx    PE: BP 140/82 (BP Location:  Left Arm, Patient Position: Sitting)   Pulse 76   Ht 5' 4.5" (1.638 m)   Wt 233 lb (105.7 kg)   SpO2 96%   BMI 39.38 kg/m  Wt Readings from Last 3 Encounters:  10/03/16 233 lb (105.7 kg)  08/13/16 233 lb (105.7 kg)  07/09/16 236 lb 12.8 oz (107.4 kg)   Constitutional:  overweight, in NAD Eyes: PERRLA, EOMI, no exophthalmos ENT: moist mucous membranes, no thyromegaly, no cervical lymphadenopathy Cardiovascular: RRR, No MRG Respiratory: CTA B Gastrointestinal: abdomen soft, NT, ND, BS+ Musculoskeletal: no deformities, strength intact in all 4 Skin: moist, warm, no rashes Neurological: no tremor with outstretched hands, DTR normal in all 4  ASSESSMENT: 1. DM2, insulin-dependent, uncontrolled, with complications - mild CKD - CAD - PAF  2. Hypothyroidism  PLAN:  1. Patient with long-standing, uncontrolled diabetes, on oral antidiabetic regimen and a GLP-1 receptor agonist. Her diabetes control improved after she started to change her diet towards the vegan diet. She still has dietary indiscretions, but she still mindful about what she eats. Her sugars have improved further, except the CBGs in the morning, which remain high. Since last visit, she moved the metformin from dinnertime to breakfast, and I believe that this may be the reason for her still elevated morning sugars. I advised her to move the metformin at night. - I suggested to:  Patient Instructions  Please continue: - Metformin ER 2000 units but move this to dinnertime  - Glipizide ER 10 mg daily in am. - Trulicity 1.5 mg weekly in am  Please return in 3-4 months with your sugar log.   - today, HbA1c is 7.9% (best she had in a long time) - continue checking sugars at different times of the day - check 1x a day, rotating checks - advised for yearly eye exams >> she is UTD - Return to clinic in 3 mo with sugar log   2. Hypothyroidism - This is worse per last TSH check however, PCP increased her levothyroxine dose to 112 mcg daily since then. -  she did not have a repeat TSH since then, but has an appointment with her PCP next month  - We will review there results when she comes back for next visit  Philemon Kingdom, MD PhD [facility] Endocrinology

## 2016-10-15 ENCOUNTER — Encounter (HOSPITAL_COMMUNITY): Payer: Self-pay | Admitting: Nurse Practitioner

## 2016-10-15 ENCOUNTER — Ambulatory Visit (HOSPITAL_COMMUNITY)
Admission: RE | Admit: 2016-10-15 | Discharge: 2016-10-15 | Disposition: A | Discharge status: Home / Self Care | Payer: Medicare Other | Source: Ambulatory Visit | Attending: Nurse Practitioner | Admitting: Nurse Practitioner

## 2016-10-15 VITALS — BP 162/88 | HR 85 | Ht 64.5 in | Wt 232.2 lb

## 2016-10-15 DIAGNOSIS — Z79899 Other long term (current) drug therapy: Secondary | ICD-10-CM | POA: Diagnosis not present

## 2016-10-15 DIAGNOSIS — E1122 Type 2 diabetes mellitus with diabetic chronic kidney disease: Secondary | ICD-10-CM | POA: Insufficient documentation

## 2016-10-15 DIAGNOSIS — I480 Paroxysmal atrial fibrillation: Secondary | ICD-10-CM | POA: Insufficient documentation

## 2016-10-15 DIAGNOSIS — Z880 Allergy status to penicillin: Secondary | ICD-10-CM | POA: Diagnosis not present

## 2016-10-15 DIAGNOSIS — G4733 Obstructive sleep apnea (adult) (pediatric): Secondary | ICD-10-CM | POA: Diagnosis not present

## 2016-10-15 DIAGNOSIS — Z794 Long term (current) use of insulin: Secondary | ICD-10-CM | POA: Insufficient documentation

## 2016-10-15 DIAGNOSIS — Z8249 Family history of ischemic heart disease and other diseases of the circulatory system: Secondary | ICD-10-CM | POA: Insufficient documentation

## 2016-10-15 DIAGNOSIS — Z87891 Personal history of nicotine dependence: Secondary | ICD-10-CM | POA: Diagnosis not present

## 2016-10-15 DIAGNOSIS — E669 Obesity, unspecified: Secondary | ICD-10-CM | POA: Diagnosis not present

## 2016-10-15 DIAGNOSIS — N189 Chronic kidney disease, unspecified: Secondary | ICD-10-CM | POA: Insufficient documentation

## 2016-10-15 DIAGNOSIS — Z9889 Other specified postprocedural states: Secondary | ICD-10-CM | POA: Diagnosis not present

## 2016-10-15 DIAGNOSIS — I129 Hypertensive chronic kidney disease with stage 1 through stage 4 chronic kidney disease, or unspecified chronic kidney disease: Secondary | ICD-10-CM | POA: Insufficient documentation

## 2016-10-15 DIAGNOSIS — Z7901 Long term (current) use of anticoagulants: Secondary | ICD-10-CM | POA: Insufficient documentation

## 2016-10-15 DIAGNOSIS — Z888 Allergy status to other drugs, medicaments and biological substances status: Secondary | ICD-10-CM | POA: Insufficient documentation

## 2016-10-15 DIAGNOSIS — Z833 Family history of diabetes mellitus: Secondary | ICD-10-CM | POA: Insufficient documentation

## 2016-10-15 NOTE — Progress Notes (Signed)
Patient ID: Courtney Pena, female   DOB: [birth year], 76 y.o.   MRN: [id number]     Primary Care Physician: Courtney Resides, MD Referring Physician: Dr. Caryl Pena Single is a 76 y.o. 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 Johnsie Pena and being seen 7/7 in afib clinic.Marland Kitchen  Other than a few intermittent flutters, she has not noticed any sustained afib. Tolerating flecainide well.Tolerating  Xarelto, no bleeding issue, but is in do-nut hole and will help fill out assistance forms today to see if she will qualify for assistance. She did have a f/u ETT, for which she did not stay on the treadmill for a minute but no EKG changes were noted with exertion.  Returns to afib clinic 8/17, for c/o blurred vision. She states that she has noted vision change since starting flecainide but thought it would wear off. She stopped flecainide yesterday and vision is already better. She is in SR today and since starting flecainide, afib burden has been low. She also stopped losartan because she retained fluid with previous use.   Her PCP wanted her to try one more time to see if the S.E. occurred again and it did. Will f/u with PCP for BP.  F/u in afib clinc 12/11. She is off flecainide. She saw Courtney Pena in August and was given options for ablation or Rythmol. She decide that she would not make any changes. She feels that she has not noted the irregularity related with afib but has noted a different irregularity. She had pac's on her EKG the other day and what she describes fits this pattern.   She asked to be seen in afib clinic today with a few concerns. She describes seconds to minutes of a sensation that will start in her stomach  and spread to her chest, feels a flutter and a pin prick. This may happen 2x a day. Usually at rest. She wanted to  report these symptoms but does not feel they are bad enough to change her approach. She had a stress test in 2017 which was low risk.  F/u in afib clinic 5/14 for increase in afib burden. She will note 2x a week and she will return to SR within one hour with taking 30 mg cardizem.. She had been on flecainide in the past but did not tolerate. She would like to discuss  with Courtney Pena pursuing ablation. She did see him last August but was not ready to commit at that time.   F/u afib clinic 8/20. She has the same afib pattern, around 1-2x a week with 30 mg cardizem getting her back to SR within around 30 mins. She did have an appointment with Courtney Pena re ablation, but since burden was low, it was more wait and see. She is still happy to take this approach.  Today, she denies symptoms of palpitations, chest pain, shortness of breath, orthopnea, PND, lower extremity edema, dizziness, presyncope, syncope, or neurologic sequela. The patient is tolerating medications without difficulties and is otherwise without complaint today.   Past Medical History:  Diagnosis Date  . Allergy   . Anemia due to GI blood loss 08/12/2011  . Arthritis   . Asthma   . Atrial flutter ([place]) 12-07-2010   converted in ED with 300 mg flecainide  . CAD (coronary artery disease)    a. mild  per cath in 2004;  b. nonischemic Myoview in March 2012;  c. Lex MV 1/14:  EF 66%, no ischemia  . Chronic anticoagulation - Xarelto started 07/07/2015, CHADS2CVASC=5 07/07/2015  . External hemorrhoids 06/07/2010  . Gastric antral vascular ectasia    source for gi bleed in 07/2011 - Xarelto stopped  . Gastritis   . GERD (gastroesophageal reflux disease)   . Hiatal hernia   . Hyperlipidemia   . Hypertension   . Hypothyroidism   . Neuromuscular disorder ([place])   . Obesity   . Paroxysmal atrial fibrillation (H[facility])   . Personal history of colonic polyps 06/06/2009   cecal polyp  . Sleep apnea    does not use CPAP  . Type 2 diabetes  mellitus with diabetic chronic kidney disease ([place]) 04/25/2006      . Ulcer    Past Surgical History:  Procedure Laterality Date  . BREAST BIOPSY Left   . CARDIAC CATHETERIZATION  1999&2004  . CARPAL TUNNEL RELEASE Bilateral 2003  . CATARACT EXTRACTION Bilateral   . COLONOSCOPY    . ESOPHAGOGASTRODUODENOSCOPY  08/13/2011   Procedure: ESOPHAGOGASTRODUODENOSCOPY (EGD);  Surgeon: Courtney Mayer, MD;  Location: [facility] ENDOSCOPY;  Service: Endoscopy;  Laterality: N/A;  . POLYPECTOMY      Current Outpatient Prescriptions  Medication Sig Dispense Refill  . Cholecalciferol (VITAMIN D3) 2000 units TABS Take 1 capsule by mouth daily.     . clobetasol cream (TEMOVATE) 0.73 % Apply 1 application topically as needed (Use as directed).     . colchicine 0.6 MG tablet Take 1 tablet by mouth 3 (three) times daily as needed.  0  . diltiazem (CARDIZEM CD) 360 MG 24 hr capsule TAKE 1 CAPSULE BY MOUTH  DAILY 90 capsule 3  . diltiazem (CARDIZEM) 30 MG tablet Take 1 tablet by mouth every 4 hours AS NEEDED for heart rate >100 as long as blood pressure >100.    . Dulaglutide (TRULICITY) 1.5 XT/0.6YI SOPN Inject 1.5 mg into the skin as directed.     Marland Kitchen glipiZIDE (GLUCOTROL XL) 10 MG 24 hr tablet Take 1 tablet (10 mg total) by mouth daily with breakfast. 30 tablet 1  . hydrochlorothiazide (HYDRODIURIL) 12.5 MG tablet Take 12.5 mg by mouth daily as needed (swelling/fluid retention).    . Insulin Pen Needle 32G X 4 MM MISC Use 1x a day 100 each 3  . levothyroxine (SYNTHROID, LEVOTHROID) 112 MCG tablet Take 1 tablet (112 mcg total) by mouth daily. 90 tablet 3  . metFORMIN (GLUCOPHAGE-XR) 500 MG 24 hr tablet Take 4 tablets (2,000 mg total) by mouth daily with supper. 360 tablet 3  . metoprolol succinate (TOPROL-XL) 100 MG 24 hr tablet TAKE 1 TABLET BY MOUTH  DAILY WITH OR IMMEDIATELY  FOLLOWING A MEAL 90 tablet 3  . nitroGLYCERIN (NITROSTAT) 0.4 MG SL tablet Place 1 tablet (0.4 mg total) under the tongue every 5 (five)  minutes as needed for chest pain. 25 tablet 1  . ONE TOUCH ULTRA TEST test strip TEST 3 TIMES DAILY 300 each 3  . XARELTO 20 MG TABS tablet TAKE 1 TABLET (20 MG TOTAL) BY MOUTH DAILY WITH SUPPER. 30 tablet 11  . traMADol (ULTRAM) 50 MG tablet Take 50 mg by mouth every 6 (six) hours as needed for moderate pain or severe pain.     No current facility-administered medications for this encounter.     Allergies  Allergen Reactions  . Lisinopril Swelling    REACTION: swelling, may have had some breathing  involvement.   . Nsaids Other (See Comments)    Patient reports internal bleeding  . Penicillins Shortness Of Breath and Swelling    Arm Swelling with Penicillin (Occurred in 1960s) Breathing - throat swelling with Amoxicillin (Occurred prior to 2002)  . Sulfamethoxazole Hives and Itching    "welps all over" immediately after dose  . Actos [Pioglitazone] Swelling    REACTION: swelling all over body  . Flecainide Other (See Comments)    Blurry  vision  . Gabapentin Other (See Comments)    Caused dysphora  . Iodinated Diagnostic Agents Itching    Pt. Developed mild itching after receiving IV cm; pt. Held; Dr. Keane Scrape recomended she take 50 mg of benadryl when she goes home-if necessary; Dr Mickey Farber recommends benadryl prior to future exams requiring contrast media, but stated other doctors may recommend another premedication prep.  . Losartan Potassium Other (See Comments)    Lower extremity swelling    Social History   Social History  . Marital status: Widowed    Spouse name: Iona Beard  . Number of children: 4  . Years of education: some colle   Occupational History  . Management Unemployed   Social History Main Topics  . Smoking status: Former Smoker    Packs/day: 2.00    Years: 30.00    Types: Cigarettes    Quit date: 02/26/1993  . Smokeless tobacco: Pena Used  . Alcohol use No  . Drug use: No  . Sexual activity: Not Currently   Other Topics Concern  . Not on file    Social History Narrative  . No narrative on file    Family History  Problem Relation Age of Onset  . Heart disease Father   . Diabetes Maternal Grandfather   . Hypertension Mother   . Other Mother        brain tumor-benign  . Diabetes Daughter        pre-diabeties  . Colon polyps Daughter   . Thyroid disease Daughter        x 2  . Colon cancer Neg Hx     ROS- All systems are reviewed and negative except as per the HPI above  Physical Exam: Vitals:   10/15/16 1036  BP: (!) 162/88  Pulse: 85  Weight: 232 lb 3.2 oz (105.3 kg)  Height: 5' 4.5" (1.638 m)    GEN- The patient is well appearing, alert and oriented x 3 today.   Head- normocephalic, atraumatic Eyes-  Sclera clear, conjunctiva pink Ears- hearing intact Oropharynx- clear Neck- supple, no JVP Lymph- no cervical lymphadenopathy Lungs- Clear to ausculation bilaterally, normal work of breathing Heart- Regular rate and rhythm, no murmurs, rubs or gallops, PMI not laterally displaced GI- soft, NT, ND, + BS Extremities- no clubbing, cyanosis, or edema MS- no significant deformity or atrophy Skin- no rash or lesion Psych- euthymic mood, full affect Neuro- strength and sensation are intact  EKG-NSR at 85 bpm, Pr int 202 ms, QRS int 86 ms, qtc 456 ms   Assessment and Plan: 1. Symptomatic paroxysmal AFIb Describes a few episodes a week Ablation discussed with Courtney Pena  In June and wait and see approach decided, he wanted her to have sleep study, did mention sotalol may be posssibility Continue xarelto for chadsvasc score of at least 6, may may ned to apply for assistance soon as she is nearing the gap  Continue dilt/BB, for breakthrough afib, cardizem 30 mg as needed  2. HTN Elevated today Avoid salt  3.  Obesity Exercise, weight loss encouraged  4. DM Pt is working on better glycemic control  5. OSA Pending sleep study    Geroge Baseman. Srah Ake, [facility] [address] [place],  [address] [phone]

## 2016-10-17 ENCOUNTER — Encounter: Payer: Self-pay | Admitting: Family Medicine

## 2016-10-17 ENCOUNTER — Ambulatory Visit (INDEPENDENT_AMBULATORY_CARE_PROVIDER_SITE_OTHER): Payer: Medicare Other | Admitting: Family Medicine

## 2016-10-17 DIAGNOSIS — M1A09X0 Idiopathic chronic gout, multiple sites, without tophus (tophi): Secondary | ICD-10-CM | POA: Diagnosis not present

## 2016-10-17 DIAGNOSIS — I480 Paroxysmal atrial fibrillation: Secondary | ICD-10-CM

## 2016-10-17 DIAGNOSIS — G609 Hereditary and idiopathic neuropathy, unspecified: Secondary | ICD-10-CM

## 2016-10-17 DIAGNOSIS — R42 Dizziness and giddiness: Secondary | ICD-10-CM | POA: Diagnosis not present

## 2016-10-17 DIAGNOSIS — E1140 Type 2 diabetes mellitus with diabetic neuropathy, unspecified: Secondary | ICD-10-CM | POA: Insufficient documentation

## 2016-10-17 MED ORDER — IBUPROFEN 600 MG PO TABS: 600.0000 mg | ORAL_TABLET | Freq: Three times a day (TID) | ORAL | 1 refills | Status: DC | PRN

## 2016-10-17 MED ORDER — DULOXETINE HCL 30 MG PO CPEP: 30.0000 mg | ORAL_CAPSULE | Freq: Every day | ORAL | 3 refills | Status: DC

## 2016-10-17 MED ORDER — ALLOPURINOL 100 MG PO TABS: 100.0000 mg | ORAL_TABLET | Freq: Every day | ORAL | 6 refills | Status: DC

## 2016-10-17 NOTE — Patient Instructions (Signed)
See me in one month.  Three meds at the pharmacy

## 2016-10-18 NOTE — Progress Notes (Signed)
   Subjective:    Patient ID: Courtney Pena, female    DOB: 04/11/1940, 76 y.o.   MRN: [id number]  HPI Several issues: 1. DM being followed by endo.  Recent A1C=7.9.  Now on insulin.  This is progress.  2. A fib.  On xarelto.  Stable without CP, palpitations or bleeding.  Xarelto influences med choices in gout below. 3. Gout. Having an attack once every 2-3 months.  Takes HCTZ rarely and knows can worsen gout.  Concerns are for acute treatment. Cochicine has drug interaction with diltiazem NSAIDs increase bleeding risk on xarelto.  Also, previous GI bleed when on chronic NSAID.  Never been on allopurinol.  Last uric acid 6.9. 4. Also having numbness of toes both feet and shooting pain both feet.  Mild back pain.  No weakness. 5. C/O intermitant ear fullness.  Previously seen by ENT    Review of Systems     Objective:   Physical Exam Foot exam done, see quality metrics. Lungs clear Cardiac regular rate and rhythm.  No m or g Normal strength and gait.  Symetric DTRs in both LE. Rt TM normal.  Left canal, moderate cerumen.  Irrigated clear.         Assessment & Plan:

## 2016-10-18 NOTE — Assessment & Plan Note (Signed)
Now describing ear fullness more on left side.  Perhaps the removal of cerumen impaction will help.  No further WU given previously seen by ENT.

## 2016-10-18 NOTE — Assessment & Plan Note (Signed)
Unclear whether neuropathy or radiculopathy.  Suspect neuropathy since bilateral symetric.  Could not tolerate gabapentin.  Try cymbalta.

## 2016-10-18 NOTE — Assessment & Plan Note (Signed)
Considered risk benefit.  Will use SHORT TERM ibuprofen for acute attacks.  Start on allopurinol to prevent acute attacks.  FU one month to recheck uric acid level.  Ibuprofen with food.

## 2016-10-18 NOTE — Assessment & Plan Note (Signed)
>>  ASSESSMENT AND PLAN FOR TYPE 2 DIABETES MELLITUS WITH DIABETIC NEUROPATHY, UNSPECIFIED (HCC) WRITTEN ON 10/18/2016  2:47 PM BY HENSEL, Santiago Bumpers, MD  Unclear whether neuropathy or radiculopathy.  Suspect neuropathy since bilateral symetric.  Could not tolerate gabapentin.  Try cymbalta.

## 2016-10-18 NOTE — Assessment & Plan Note (Signed)
Now tolerating xarelto.  Will recheck HGB next month when I check uric acid.

## 2016-11-07 ENCOUNTER — Other Ambulatory Visit: Payer: Self-pay | Admitting: Internal Medicine

## 2016-11-12 ENCOUNTER — Other Ambulatory Visit: Payer: Self-pay | Admitting: Family Medicine

## 2016-11-12 DIAGNOSIS — E039 Hypothyroidism, unspecified: Secondary | ICD-10-CM

## 2017-01-01 ENCOUNTER — Other Ambulatory Visit: Payer: Self-pay | Admitting: Family Medicine

## 2017-01-01 DIAGNOSIS — Z1231 Encounter for screening mammogram for malignant neoplasm of breast: Secondary | ICD-10-CM

## 2017-01-01 NOTE — Progress Notes (Signed)
Patient ID: Courtney Pena, female   DOB: 1940/07/19, 76 y.o.   MRN: [id number]     Cardiology Office Note   Date:  01/03/2017   ID:  Courtney Pena, DOB 1940-09-30, MRN [id number]  PCP:  [name], MD  Cardiologist:  Dr Oswaldo Conroy, MD   No chief complaint on file.   History of Present Illness:  Courtney Pena is a 76 y.o.  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 She did have a f/u ETT, for which she did not stay on the treadmill for a minute but no EKG changes were noted with exertion.  Seen in afib clinic 8/17, for c/o blurred vision. She states that she has noted vision change since starting flecainide but thought it would wear off. She stopped flecainide  and vision is already better.  She also stopped losartan because she retained fluid with previous use  She saw Dr Rayann Heman on 09/2815 and discussed options of ablation or rhythmol but ultimately decided Not to proceed with either   Husband passed in March 2017 BS does better on vegan diet   Seen in afib cliic again 10/15/16 with little break through PAF or PAC;s off flecainide Uses PRN cardizem 30 mg to stop. Still preferred not to start another AAT or have ablation.  Still with palpitations and tachycardia taking SA cardizem PRN but daily    Past Medical History:  Diagnosis Date  . Allergy   . Anemia due to GI blood loss 08/12/2011  . Arthritis   . Asthma   . Atrial flutter ([place]) 12-07-2010   converted in ED with 300 mg flecainide  . CAD (coronary artery disease)    a. mild per cath in 2004;  b. nonischemic Myoview in March 2012;  c. Lex MV 1/14:  EF 66%, no ischemia  . Chronic anticoagulation - Xarelto started 07/07/2015, CHADS2CVASC=5 07/07/2015  . External hemorrhoids 06/07/2010  . Gastric antral vascular  ectasia    source for gi bleed in 07/2011 - Xarelto stopped  . Gastritis   . GERD (gastroesophageal reflux disease)   . Hiatal hernia   . Hyperlipidemia   . Hypertension   . Hypothyroidism   . Neuromuscular disorder ([place])   . Obesity   . Paroxysmal atrial fibrillation ([facility])   . Personal history of colonic polyps 06/06/2009   cecal polyp  . Sleep apnea    does not use CPAP  . Type 2 diabetes mellitus with diabetic chronic kidney disease ([place]) 04/25/2006      . Ulcer     Past Surgical History:  Procedure Laterality Date  . BREAST BIOPSY Left   . CARDIAC CATHETERIZATION  1999&2004  . CARPAL TUNNEL RELEASE Bilateral 2003  . CATARACT EXTRACTION Bilateral   . COLONOSCOPY    . POLYPECTOMY      Current Outpatient Medications  Medication Sig Dispense Refill  . allopurinol (ZYLOPRIM) 100 MG tablet Take 1 tablet (100 mg total) by mouth daily. 30 tablet 6  . Cholecalciferol (VITAMIN D3) 2000 units TABS Take 1 capsule by mouth daily.     . clobetasol cream (TEMOVATE) 2.70 % Apply 1 application topically as needed (Use as directed).     . Coenzyme Q10 (COQ10 PO) Take 10 mEq daily by mouth.    . diltiazem (CARDIZEM CD) 360 MG 24 hr capsule TAKE 1 CAPSULE BY  MOUTH  DAILY 90 capsule 3  . diltiazem (CARDIZEM) 30 MG tablet Take 1 tablet by mouth every 4 hours AS NEEDED for heart rate >100 as long as blood pressure >100.    . glipiZIDE (GLUCOTROL XL) 10 MG 24 hr tablet Take 1 tablet (10 mg total) by mouth daily with breakfast. 30 tablet 1  . hydrochlorothiazide (HYDRODIURIL) 12.5 MG tablet Take 12.5 mg by mouth daily as needed (swelling/fluid retention).    . Insulin Pen Needle 32G X 4 MM MISC Use 1x a day 100 each 3  . levothyroxine (SYNTHROID, LEVOTHROID) 112 MCG tablet TAKE 1 TABLET BY MOUTH  DAILY 90 tablet 3  . meclizine (ANTIVERT) 25 MG tablet Take 25 mg as needed by mouth.    . metFORMIN (GLUCOPHAGE-XR) 500 MG 24 hr tablet Take 4 tablets (2,000 mg total) by mouth daily with supper. 360  tablet 3  . metoprolol succinate (TOPROL-XL) 100 MG 24 hr tablet Take 1 tablet (100 mg total) 2 (two) times daily by mouth. Take with or immediately following a meal. 180 tablet 3  . Multiple Vitamin (MULTI-VITAMINS) TABS Take daily by mouth.    . nitroGLYCERIN (NITROSTAT) 0.4 MG SL tablet Place 1 tablet (0.4 mg total) under the tongue every 5 (five) minutes as needed for chest pain. 25 tablet 1  . ONE TOUCH ULTRA TEST test strip TEST 3 TIMES DAILY 300 each 3  . traMADol (ULTRAM) 50 MG tablet Take 50 mg by mouth every 6 (six) hours as needed for moderate pain or severe pain.    . TRULICITY 1.5 QI/3.4VQ SOPN INJECT 1.5 MG UNDER SKIN EVERY WEEK 12 pen 3  . XARELTO 20 MG TABS tablet TAKE 1 TABLET (20 MG TOTAL) BY MOUTH DAILY WITH SUPPER. 30 tablet 11   No current facility-administered medications for this visit.     Allergies:   Lisinopril; Nsaids; Penicillins; Sulfamethoxazole; Actos [pioglitazone]; Flecainide; Gabapentin; Iodinated diagnostic agents; and Losartan potassium    Social History:  The patient  reports that she quit smoking about 23 years ago. Her smoking use included cigarettes. She has a 60.00 pack-year smoking history. she has never used smokeless tobacco. She reports that she does not drink alcohol or use drugs.   Family History:  The patient's family history includes Colon polyps in her daughter; Diabetes in her daughter and maternal grandfather; Heart disease in her father; Hypertension in her mother; Other in her mother; Thyroid disease in her daughter.    ROS:  Please see the history of present illness. All other systems are reviewed and negative.    PHYSICAL EXAM: VS:  BP (!) 154/84   Pulse (!) 107   Ht 5\' 5"  (1.651 m)   Wt 235 lb 12.8 oz (107 kg)   SpO2 97%   BMI 39.24 kg/m  , BMI Body mass index is 39.24 kg/m. Affect appropriate Overweight black female  HEENT: normal Neck supple with no adenopathy JVP normal no bruits no thyromegaly Lungs clear with no  wheezing and good diaphragmatic motion Heart:  S1/S2 no murmur, no rub, gallop or click PMI normal Abdomen: benighn, BS positve, no tenderness, no AAA no bruit.  No HSM or HJR Distal pulses intact with no bruits No edema Neuro non-focal Skin warm and dry No muscular weakness    EKG:   07/07/15  sinus rhythm with borderline first-degree AV block, PR 210 ms  08/05/15  SR rate 82  Low voltage PR 208 msec poor R wave progression 01/03/17 SR rate 108  low voltage normal PR 208      Recent Labs: 05/03/2016: ALT 14; BUN 25; Creatinine, Ser 0.96; Hemoglobin 11.2; Platelets 336; Potassium 4.5; Sodium 137; TSH 4.590    Lipid Panel    Component Value Date/Time   CHOL 270 (H) 05/03/2016 1412   TRIG 152 (H) 05/03/2016 1412   HDL 69 05/03/2016 1412   CHOLHDL 3.9 05/03/2016 1412   CHOLHDL 4.7 01/27/2015 1238   VLDL 28 01/27/2015 1238   LDLCALC 171 (H) 05/03/2016 1412   LDLDIRECT 178 (H) 05/12/2015 1229     Wt Readings from Last 3 Encounters:  01/03/17 235 lb 12.8 oz (107 kg)  10/17/16 231 lb 3.2 oz (104.9 kg)  10/15/16 232 lb 3.2 oz (105.3 kg)     Other studies Reviewed: Additional studies/ records that were reviewed today include: Office notes, hospital records and testing  ASSESSMENT AND PLAN:  1.  Paroxysmal atrial fibrillation: intolerant to flecainide In NSR but HR high and having symptoms that May be compatible with PAF. Will increase toprol to 100 bid and arrange event monitor f/u afib clinic IF she has documented PAF would consider adding betapace   2. Anticoagulation: CHADS2VASC=5. She was previously on Xarelto, but it was discontinued for an upper GI bleed. 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 Because of her epigastric symptoms, she had an EGD 07/06/2015. It showed no significant abnormality, specifically no vascular ectasia. Back on NOAC  3. HTN:  Well controlled.  Continue current medications and low sodium Dash type diet.    4. Diastolic Dysfunction:  Stable needs  to lose weight  5. DM:  Major issue poorly controlled  Lab Results  Component Value Date   HGBA1C 7.9 10/03/2016   Victosa was effective but she couldn't afford it Januvia ineffective Refuses insulin f/u [place] And continue metformin, glipizide and Trulicity with vegan diet   6. Chest Pain AtypicalETT on flecainide ok and myovue 07/02/16 normal  EF 59%   [name]

## 2017-01-03 ENCOUNTER — Encounter: Payer: Self-pay | Admitting: Cardiovascular Disease

## 2017-01-03 ENCOUNTER — Ambulatory Visit: Payer: Medicare Other | Admitting: Cardiovascular Disease

## 2017-01-03 ENCOUNTER — Ambulatory Visit: Payer: Medicare Other | Admitting: Internal Medicine

## 2017-01-03 ENCOUNTER — Ambulatory Visit (INDEPENDENT_AMBULATORY_CARE_PROVIDER_SITE_OTHER): Payer: Medicare Other

## 2017-01-03 VITALS — BP 154/84 | HR 107 | Ht 65.0 in | Wt 235.8 lb

## 2017-01-03 DIAGNOSIS — I5189 Other ill-defined heart diseases: Secondary | ICD-10-CM

## 2017-01-03 DIAGNOSIS — I519 Heart disease, unspecified: Secondary | ICD-10-CM

## 2017-01-03 DIAGNOSIS — I10 Essential (primary) hypertension: Secondary | ICD-10-CM | POA: Diagnosis not present

## 2017-01-03 DIAGNOSIS — Z0289 Encounter for other administrative examinations: Secondary | ICD-10-CM

## 2017-01-03 DIAGNOSIS — I480 Paroxysmal atrial fibrillation: Secondary | ICD-10-CM

## 2017-01-03 MED ORDER — METOPROLOL SUCCINATE ER 100 MG PO TB24: 100.0000 mg | ORAL_TABLET | Freq: Two times a day (BID) | ORAL | 3 refills | Status: DC

## 2017-01-03 NOTE — Patient Instructions (Addendum)
Medication Instructions:  Your physician has recommended you make the following change in your medication:  1-INCREASE Metoprolol 100 mg by mouth twice daily  Labwork: NONE  Testing/Procedures: Your physician has recommended that you wear an event monitor 30 days. Event monitors are medical devices that record the heart's electrical activity. Doctors most often [name] these monitors to diagnose arrhythmias. Arrhythmias are problems with the speed or rhythm of the heartbeat. The monitor is a small, portable device. You can wear one while you do your normal daily activities. This is usually used to diagnose what is causing palpitations/syncope (passing out).  Follow-Up: Your physician wants you to follow-up in: 4 weeks with Roderic Palau NP in the A. Fib clinic.   If you need a refill on your cardiac medications before your next appointment, please call your pharmacy.

## 2017-01-09 ENCOUNTER — Ambulatory Visit: Payer: Medicare Other | Admitting: Family Medicine

## 2017-01-24 ENCOUNTER — Other Ambulatory Visit: Payer: Self-pay

## 2017-01-24 ENCOUNTER — Encounter: Payer: Self-pay | Admitting: Family Medicine

## 2017-01-24 ENCOUNTER — Ambulatory Visit: Payer: Medicare Other | Admitting: Family Medicine

## 2017-01-24 VITALS — BP 154/88 | HR 79 | Temp 98.2°F | Ht 65.0 in | Wt 236.0 lb

## 2017-01-24 DIAGNOSIS — E7800 Pure hypercholesterolemia, unspecified: Secondary | ICD-10-CM

## 2017-01-24 DIAGNOSIS — E1169 Type 2 diabetes mellitus with other specified complication: Secondary | ICD-10-CM

## 2017-01-24 DIAGNOSIS — E785 Hyperlipidemia, unspecified: Secondary | ICD-10-CM | POA: Diagnosis not present

## 2017-01-24 DIAGNOSIS — L300 Nummular dermatitis: Secondary | ICD-10-CM

## 2017-01-24 DIAGNOSIS — E038 Other specified hypothyroidism: Secondary | ICD-10-CM | POA: Diagnosis not present

## 2017-01-24 DIAGNOSIS — E1122 Type 2 diabetes mellitus with diabetic chronic kidney disease: Secondary | ICD-10-CM

## 2017-01-24 DIAGNOSIS — D500 Iron deficiency anemia secondary to blood loss (chronic): Secondary | ICD-10-CM | POA: Diagnosis not present

## 2017-01-24 DIAGNOSIS — I10 Essential (primary) hypertension: Secondary | ICD-10-CM | POA: Diagnosis not present

## 2017-01-24 LAB — POCT GLYCOSYLATED HEMOGLOBIN (HGB A1C): Hemoglobin A1C: 9

## 2017-01-24 MED ORDER — AMLODIPINE BESYLATE 5 MG PO TABS: 5.0000 mg | ORAL_TABLET | Freq: Every day | ORAL | 3 refills | Status: DC

## 2017-01-24 MED ORDER — OMEPRAZOLE 40 MG PO CPDR: 40.0000 mg | DELAYED_RELEASE_CAPSULE | Freq: Every day | ORAL | 3 refills | Status: DC

## 2017-01-24 MED ORDER — TRIAMCINOLONE ACETONIDE 0.1 % EX CREA: 1.0000 "application " | TOPICAL_CREAM | Freq: Two times a day (BID) | CUTANEOUS | 3 refills | Status: DC

## 2017-01-24 NOTE — Assessment & Plan Note (Signed)
Refill [id number]

## 2017-01-24 NOTE — Assessment & Plan Note (Addendum)
Check labs  Restart daily omeprazole She will get back on iron.

## 2017-01-24 NOTE — Progress Notes (Signed)
   Subjective:    Patient ID: Courtney Pena, female    DOB: 03/25/1940, 76 y.o.   MRN: [id number]  HPI multiple issues: 1. More heartburn.  Now on prn H2 blocker.  No bleeding or vomiting.  Was controled on regular PPI.  I switched off due to osteoporosis concerns. 2. HBP readings high at home.  Cannot take ACE or ARB.  Not on daily HCTZ due to gout.   3. Diabetes - poor control  Admits to dietary indiscretion.  Still unwilling to consider isulin. 4. Diabetic neuropathy.  Mild to mod sx of both feet.   5. More frequent PAF episodes.  Followed by cards.  On eliquies. 6. Could not tolerate statin (again)  On no meds.   7. Needs diabetic eye exam 8. Needs flu shot.   Review of Systems     Objective:   Physical Exam Lungs clear Cardiac RRR without m or g      Assessment & Plan:

## 2017-01-24 NOTE — Assessment & Plan Note (Signed)
Cannot tolerate statins.  Add zetia?  Refer to Dr. Valentina Lucks

## 2017-01-24 NOTE — Assessment & Plan Note (Signed)
Concerned that she is on no meds and at significant cardiovascular risk.  Refer to [facility]?

## 2017-01-24 NOTE — Patient Instructions (Signed)
Due for an eye exam.  Have them send me a copy of results. I added another blood pressure drug I will call with lab results. See me in 3 months. See Nicholas Lose at your earliest convenience for the diabetes and cholesterol questions. Get serious about the diabetes control.  Because my hands are tied, it is more in your hands. Put up with the neuropathy for now.  Let me know if it gets bad enough to add another med. I'm putting you back on the omeprazole for the indigestion.

## 2017-01-24 NOTE — Assessment & Plan Note (Signed)
Poor control  Refer back to [place] does not benefit from sulfonylurea.  She is motivated to do better with diet and exercise.

## 2017-01-24 NOTE — Assessment & Plan Note (Signed)
A bit odd to add a second calcium channel blocker, but intollerant of ACE and ARB

## 2017-01-25 LAB — CBC
Hematocrit: 34.6 % (ref 34.0–46.6)
Hemoglobin: 10.5 g/dL — ABNORMAL LOW (ref 11.1–15.9)
MCH: 24.8 pg — ABNORMAL LOW (ref 26.6–33.0)
MCHC: 30.3 g/dL — ABNORMAL LOW (ref 31.5–35.7)
MCV: 82 fL (ref 79–97)
Platelets: 292 10*3/uL (ref 150–379)
RBC: 4.24 x10E6/uL (ref 3.77–5.28)
RDW: 18.7 % — ABNORMAL HIGH (ref 12.3–15.4)
WBC: 5.9 10*3/uL (ref 3.4–10.8)

## 2017-01-25 LAB — CMP14+EGFR
ALT: 18 IU/L (ref 0–32)
AST: 18 IU/L (ref 0–40)
Albumin/Globulin Ratio: 1.2 (ref 1.2–2.2)
Albumin: 4.1 g/dL (ref 3.5–4.8)
Alkaline Phosphatase: 92 IU/L (ref 39–117)
BUN/Creatinine Ratio: 15 (ref 12–28)
BUN: 14 mg/dL (ref 8–27)
Bilirubin Total: <0.2 mg/dL (ref 0.0–1.2)
CO2: 22 mmol/L (ref 20–29)
Calcium: 9.7 mg/dL (ref 8.7–10.3)
Chloride: 102 mmol/L (ref 96–106)
Creatinine, Ser: 0.93 mg/dL (ref 0.57–1.00)
GFR calc Af Amer: 69 mL/min/{1.73_m2} (ref >59)
GFR calc non Af Amer: 60 mL/min/{1.73_m2} (ref >59)
Globulin, Total: 3.5 g/dL (ref 1.5–4.5)
Glucose: 243 mg/dL — ABNORMAL HIGH (ref 65–99)
Potassium: 4.7 mmol/L (ref 3.5–5.2)
Sodium: 139 mmol/L (ref 134–144)
Total Protein: 7.6 g/dL (ref 6.0–8.5)

## 2017-01-25 LAB — FERRITIN: Ferritin: 15 ng/mL (ref 15–150)

## 2017-01-25 LAB — TSH: TSH: 9.61 u[IU]/mL — ABNORMAL HIGH (ref 0.450–4.500)

## 2017-01-25 LAB — LDL CHOLESTEROL, DIRECT: LDL Direct: 172 mg/dL — ABNORMAL HIGH (ref 0–99)

## 2017-01-25 MED ORDER — FERROUS SULFATE 325 (65 FE) MG PO TABS: 325.0000 mg | ORAL_TABLET | Freq: Every day | ORAL | 3 refills | Status: DC

## 2017-01-25 NOTE — Addendum Note (Signed)
Addended by: [name] on: 01/25/2017 12:53 PM   Modules accepted: Orders

## 2017-01-25 NOTE — Assessment & Plan Note (Signed)
TSH elevated.  States not taking thyroid med regularly.  She will take regularly and recheck in 2-3 months.

## 2017-01-30 ENCOUNTER — Ambulatory Visit: Payer: Medicare Other

## 2017-01-31 ENCOUNTER — Encounter: Payer: Self-pay | Admitting: Pharmacist

## 2017-01-31 ENCOUNTER — Ambulatory Visit: Payer: Medicare Other | Admitting: Pharmacist

## 2017-01-31 ENCOUNTER — Ambulatory Visit (HOSPITAL_COMMUNITY): Payer: Medicare Other | Admitting: Nurse Practitioner

## 2017-01-31 DIAGNOSIS — E7800 Pure hypercholesterolemia, unspecified: Secondary | ICD-10-CM | POA: Diagnosis not present

## 2017-01-31 DIAGNOSIS — E785 Hyperlipidemia, unspecified: Secondary | ICD-10-CM

## 2017-01-31 DIAGNOSIS — E1122 Type 2 diabetes mellitus with diabetic chronic kidney disease: Secondary | ICD-10-CM

## 2017-01-31 DIAGNOSIS — E1169 Type 2 diabetes mellitus with other specified complication: Secondary | ICD-10-CM

## 2017-01-31 MED ORDER — LIRAGLUTIDE 18 MG/3ML ~~LOC~~ SOPN: 0.6000 mg | PEN_INJECTOR | Freq: Every day | SUBCUTANEOUS | 0 refills | Status: DC

## 2017-01-31 MED ORDER — EZETIMIBE 10 MG PO TABS: 10.0000 mg | ORAL_TABLET | Freq: Every day | ORAL | 1 refills | Status: DC

## 2017-01-31 MED ORDER — ONETOUCH ULTRA 2 W/DEVICE KIT: 1.0000 | PACK | Freq: Once | 0 refills | Status: AC

## 2017-01-31 MED ORDER — RIVAROXABAN 20 MG PO TABS
20.0000 mg | ORAL_TABLET | Freq: Every day | ORAL | 0 refills | Status: DC
Start: 2017-01-31 — End: 2017-07-01

## 2017-01-31 NOTE — Progress Notes (Signed)
    S:     Chief Complaint  Patient presents with  . Medication Management    diabetes    Patient arrives in good spirits, walking without assistance.  Presents for diabetes evaluation, education, and management and cholesterol medication updates at the request of her PCP Dr. Andria Frames. Patient was referred and last seen by her PCP on 01/24/2017.   Patient reports a long history of Diabetes - she requests a change from Trulicity BACK to Victoza which she believes worked better for her.  She has 1 pen of Trulicity remaining.  She is currently in the "donut hole".    Patient reports adherence with medications.  Current diabetes medications include: Glipizide and Trulicity Current hypertension medications include: diltiazem, amlodipine, and metoprolol.   Patient denies hypoglycemic events.  Intolerance to statin medications is listed as multiple and severe (added to allergy/intolerance list)   O:  Physical Exam  Constitutional: She appears well-developed and well-nourished.     Review of Systems  All other systems reviewed and are negative.    Lab Results  Component Value Date   HGBA1C 9.0 01/24/2017   Vitals:   01/31/17 1559 01/31/17 1621  BP: (!) 158/82 136/77  Pulse: 80   SpO2: 98%     Home fasting CBG: 140 -200 2 hour post-prandial/random CBG: minimal readings due to meter being broken.    A/P: Diabetes longstanding currently interested in switching back to Liraglutide (from dulaglutide) - Instructions and samples provided.  Continue metformin and glipizide at same doses at this time.   ASCVD risk greater than 7.5% in patient with multiple statin intolerances (myalgias).  Initiate ezetimibe 10mg  daily. Start with TIW the 5x per week then daily.  Repeat lipid panel in 6-8 weeks if patient able to tolerate this medication. Instructed to continue CoQ10 at this time.   Hypertension longstanding currently at goal on recheck in office.   Written patient instructions  provided.  Total time in face to face counseling 45 minutes.   Follow up in Pharmacist Clinic Visit 6-8 weeks.

## 2017-01-31 NOTE — Assessment & Plan Note (Signed)
ASCVD risk greater than 7.5% in patient with multiple statin intolerances (myalgias).  Initiate ezetimibe 10mg  daily. Start with TIW the 5x per week then daily.  Repeat lipid panel in 6-8 weeks if patient able to tolerate this medication. Instructed to continue CoQ10 at this time.

## 2017-01-31 NOTE — Progress Notes (Signed)
Patient ID: Courtney Pena, female   DOB: [birth year], 76 y.o.   MRN: [id number] Reviewed: agree with Dr. Graylin Shiver documentation and management.

## 2017-01-31 NOTE — Assessment & Plan Note (Addendum)
Diabetes longstanding currently interested in switching back to Liraglutide (from dulaglutide) - Instructions and samples provided.  Continue metformin and glipizide at same doses at this time.   New Onetouch Ultra 2 CBG meter Rx sent to pharmacy to replace old broken meter.

## 2017-01-31 NOTE — Patient Instructions (Addendum)
Stop Trulicity AFTER next and last dose you have in your supply.  On December 18th START Victoza at 0.6mg  for 1 week then 1.2mg  for 1 week then 1.8mg  daily  Start Ezetimibe (Zetia) 10mg  - take three days per week for 1 week (M, W, F) then increase to 5 days per week then to daily if you tolerate this medication.    New blood glucose meter sent to [facility] - continue to use the Ultra 2 strips.  Next visit 6-8 weeks in Rx clinic.

## 2017-02-01 DIAGNOSIS — I519 Heart disease, unspecified: Secondary | ICD-10-CM

## 2017-02-01 DIAGNOSIS — I10 Essential (primary) hypertension: Secondary | ICD-10-CM

## 2017-02-01 DIAGNOSIS — I480 Paroxysmal atrial fibrillation: Secondary | ICD-10-CM | POA: Diagnosis not present

## 2017-02-09 ENCOUNTER — Other Ambulatory Visit: Payer: Self-pay | Admitting: Internal Medicine

## 2017-02-11 NOTE — Telephone Encounter (Signed)
Yes, she was seen 4 mo ago.

## 2017-02-11 NOTE — Telephone Encounter (Signed)
Is this okay to refill? 

## 2017-02-13 ENCOUNTER — Telehealth: Payer: Self-pay | Admitting: *Deleted

## 2017-02-13 DIAGNOSIS — E1122 Type 2 diabetes mellitus with diabetic chronic kidney disease: Secondary | ICD-10-CM

## 2017-02-13 MED ORDER — LIRAGLUTIDE 18 MG/3ML ~~LOC~~ SOPN: 1.8000 mg | PEN_INJECTOR | Freq: Every day | SUBCUTANEOUS | 3 refills | Status: DC

## 2017-02-13 MED ORDER — ONETOUCH ULTRASOFT LANCETS MISC: 3 refills | Status: DC

## 2017-02-13 NOTE — Telephone Encounter (Signed)
Called, verified Rx dose and sent in scripts.

## 2017-02-13 NOTE — Telephone Encounter (Signed)
Pt calls to let Dr. Valentina Lucks and Dr. Andria Frames know that she is no longer in the "doughnut hole" she is now in what is called the "catastrophic stage" so all of her meds are free.    She would like a 3 month supply of victoza called into [facility].  She also needs lancets for a one touch ultra 2 called in as well.  [name], Salome Spotted, CMA

## 2017-02-27 ENCOUNTER — Ambulatory Visit (HOSPITAL_COMMUNITY)
Admission: RE | Admit: 2017-02-27 | Discharge: 2017-02-27 | Disposition: A | Discharge status: Home / Self Care | Payer: Medicare Other | Source: Ambulatory Visit | Attending: Nurse Practitioner | Admitting: Nurse Practitioner

## 2017-02-27 ENCOUNTER — Encounter (HOSPITAL_COMMUNITY): Payer: Self-pay | Admitting: Nurse Practitioner

## 2017-02-27 VITALS — BP 144/78 | HR 78 | Ht 65.0 in | Wt 235.8 lb

## 2017-02-27 DIAGNOSIS — Z79890 Hormone replacement therapy (postmenopausal): Secondary | ICD-10-CM | POA: Diagnosis not present

## 2017-02-27 DIAGNOSIS — Z886 Allergy status to analgesic agent status: Secondary | ICD-10-CM | POA: Diagnosis not present

## 2017-02-27 DIAGNOSIS — Z6839 Body mass index (BMI) 39.0-39.9, adult: Secondary | ICD-10-CM | POA: Diagnosis not present

## 2017-02-27 DIAGNOSIS — Z7901 Long term (current) use of anticoagulants: Secondary | ICD-10-CM | POA: Insufficient documentation

## 2017-02-27 DIAGNOSIS — Z9841 Cataract extraction status, right eye: Secondary | ICD-10-CM | POA: Diagnosis not present

## 2017-02-27 DIAGNOSIS — Z8371 Family history of colonic polyps: Secondary | ICD-10-CM | POA: Insufficient documentation

## 2017-02-27 DIAGNOSIS — Z833 Family history of diabetes mellitus: Secondary | ICD-10-CM | POA: Diagnosis not present

## 2017-02-27 DIAGNOSIS — I480 Paroxysmal atrial fibrillation: Secondary | ICD-10-CM

## 2017-02-27 DIAGNOSIS — I129 Hypertensive chronic kidney disease with stage 1 through stage 4 chronic kidney disease, or unspecified chronic kidney disease: Secondary | ICD-10-CM | POA: Diagnosis not present

## 2017-02-27 DIAGNOSIS — N189 Chronic kidney disease, unspecified: Secondary | ICD-10-CM | POA: Diagnosis not present

## 2017-02-27 DIAGNOSIS — Z8349 Family history of other endocrine, nutritional and metabolic diseases: Secondary | ICD-10-CM | POA: Insufficient documentation

## 2017-02-27 DIAGNOSIS — Z91041 Radiographic dye allergy status: Secondary | ICD-10-CM | POA: Diagnosis not present

## 2017-02-27 DIAGNOSIS — Z880 Allergy status to penicillin: Secondary | ICD-10-CM | POA: Insufficient documentation

## 2017-02-27 DIAGNOSIS — E669 Obesity, unspecified: Secondary | ICD-10-CM | POA: Diagnosis not present

## 2017-02-27 DIAGNOSIS — K219 Gastro-esophageal reflux disease without esophagitis: Secondary | ICD-10-CM | POA: Insufficient documentation

## 2017-02-27 DIAGNOSIS — Z888 Allergy status to other drugs, medicaments and biological substances status: Secondary | ICD-10-CM | POA: Insufficient documentation

## 2017-02-27 DIAGNOSIS — Z9842 Cataract extraction status, left eye: Secondary | ICD-10-CM | POA: Insufficient documentation

## 2017-02-27 DIAGNOSIS — E1122 Type 2 diabetes mellitus with diabetic chronic kidney disease: Secondary | ICD-10-CM | POA: Diagnosis not present

## 2017-02-27 DIAGNOSIS — Z87891 Personal history of nicotine dependence: Secondary | ICD-10-CM | POA: Diagnosis not present

## 2017-02-27 DIAGNOSIS — Z8249 Family history of ischemic heart disease and other diseases of the circulatory system: Secondary | ICD-10-CM | POA: Diagnosis not present

## 2017-02-27 DIAGNOSIS — J45909 Unspecified asthma, uncomplicated: Secondary | ICD-10-CM | POA: Insufficient documentation

## 2017-02-27 DIAGNOSIS — Z86010 Personal history of colonic polyps: Secondary | ICD-10-CM | POA: Diagnosis not present

## 2017-02-27 DIAGNOSIS — M1990 Unspecified osteoarthritis, unspecified site: Secondary | ICD-10-CM | POA: Insufficient documentation

## 2017-02-27 DIAGNOSIS — Z794 Long term (current) use of insulin: Secondary | ICD-10-CM | POA: Diagnosis not present

## 2017-02-27 DIAGNOSIS — Z882 Allergy status to sulfonamides status: Secondary | ICD-10-CM | POA: Diagnosis not present

## 2017-02-27 DIAGNOSIS — E785 Hyperlipidemia, unspecified: Secondary | ICD-10-CM | POA: Insufficient documentation

## 2017-02-27 DIAGNOSIS — E039 Hypothyroidism, unspecified: Secondary | ICD-10-CM | POA: Insufficient documentation

## 2017-02-27 DIAGNOSIS — Z79899 Other long term (current) drug therapy: Secondary | ICD-10-CM | POA: Insufficient documentation

## 2017-02-27 DIAGNOSIS — G4733 Obstructive sleep apnea (adult) (pediatric): Secondary | ICD-10-CM | POA: Insufficient documentation

## 2017-02-27 DIAGNOSIS — I2510 Atherosclerotic heart disease of native coronary artery without angina pectoris: Secondary | ICD-10-CM | POA: Insufficient documentation

## 2017-02-27 DIAGNOSIS — Z8719 Personal history of other diseases of the digestive system: Secondary | ICD-10-CM | POA: Insufficient documentation

## 2017-02-27 DIAGNOSIS — I4892 Unspecified atrial flutter: Secondary | ICD-10-CM | POA: Insufficient documentation

## 2017-02-27 NOTE — Progress Notes (Signed)
Patient ID: Courtney Pena, female   DOB: Feb 26, 1941, 77 y.o.   MRN: [id number]     Primary Care Physician: [name], MD Referring Physician: Dr. Caryl Never Edelen is a 77 y.o. 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 Johnsie Cancel and being seen 7/7 in afib clinic.Marland Kitchen  Other than a few intermittent flutters, she has not noticed any sustained afib. Tolerating flecainide well.Tolerating  Xarelto, no bleeding issue, but is in do-nut hole and will help fill out assistance forms today to see if she will qualify for assistance. She did have a f/u ETT, for which she did not stay on the treadmill for a minute but no EKG changes were noted with exertion.  Returns to afib clinic 8/17, for c/o blurred vision. She states that she has noted vision change since starting flecainide but thought it would wear off. She stopped flecainide yesterday and vision is already better. She is in SR today and since starting flecainide, afib burden has been low. She also stopped losartan because she retained fluid with previous use.   Her PCP wanted her to try one more time to see if the S.E. occurred again and it did. Will f/u with PCP for BP.  F/u in afib clinc 01/1116. She is off flecainide. She saw Dr. Rayann Heman in August and was given options for ablation or Rythmol. She decide that she would not make any changes. She feels that she has not noted the irregularity related with afib but has noted a different irregularity. She had pac's on her EKG the other day and what she describes fits this pattern.   She asked to be seen in afib clinic today with a few concerns. She describes seconds to minutes of a sensation that will start in her stomach  and spread to her chest, feels a flutter and a pin prick. This may happen 2x a day. Usually at rest. She wanted to  report these symptoms but does not feel they are bad enough to change her approach. She had a stress test in 2017 which was low risk.  F/u in afib clinic 06/1416 for increase in afib burden. She will note 2x a week and she will return to SR within one hour with taking 30 mg cardizem.. She had been on flecainide in the past but did not tolerate. She would like to discuss  with Dr. Rayann Heman pursuing ablation. She did see him last August but was not ready to commit at that time.   F/u afib clinic 10/15/16. She has the same afib pattern, around 1-2x a week with 30 mg cardizem getting her back to SR within around 30 mins. She did have an appointment with Dr. Rayann Heman re ablation, but since burden was low, it was more wait and see. She is still happy to take this approach.  F/u 02/27/17, she is in the clinic for f/u of event monitor placed by Dr. Johnsie Cancel after increasing metoprolol to 100 mg bid. Monitor did not show any afib or significant PC burden. She does not report having to take any extra Cardizem in a while. continues on xarelto without issues.  Today, she denies symptoms of palpitations, chest pain, shortness of breath, orthopnea, PND, lower extremity edema, dizziness, presyncope, syncope, or neurologic sequela. The patient is tolerating medications without difficulties and is otherwise without complaint today.   Past  Medical History:  Diagnosis Date  . Allergy   . Anemia due to GI blood loss 08/12/2011  . Arthritis   . Asthma   . Atrial flutter ([place]) 12-07-2010   converted in ED with 300 mg flecainide  . CAD (coronary artery disease)    a. mild per cath in 2004;  b. nonischemic Myoview in March 2012;  c. Lex MV 1/14:  EF 66%, no ischemia  . Chronic anticoagulation - Xarelto started 07/07/2015, CHADS2CVASC=5 07/07/2015  . External hemorrhoids 06/07/2010  . Gastric antral vascular ectasia    source for gi bleed in 07/2011 - Xarelto stopped  . Gastritis   . GERD (gastroesophageal reflux disease)   .  Hiatal hernia   . Hyperlipidemia   . Hypertension   . Hypothyroidism   . Neuromuscular disorder ([place])   . Obesity   . Paroxysmal atrial fibrillation ([facility])   . Personal history of colonic polyps 06/06/2009   cecal polyp  . Sleep apnea    does not use CPAP  . Type 2 diabetes mellitus with diabetic chronic kidney disease ([place]) 04/25/2006      . Ulcer    Past Surgical History:  Procedure Laterality Date  . BREAST BIOPSY Left   . CARDIAC CATHETERIZATION  1999&2004  . CARPAL TUNNEL RELEASE Bilateral 2003  . CATARACT EXTRACTION Bilateral   . COLONOSCOPY    . ESOPHAGOGASTRODUODENOSCOPY  08/13/2011   Procedure: ESOPHAGOGASTRODUODENOSCOPY (EGD);  Surgeon: Gatha Mayer, MD;  Location[facility] ENDOSCOPY;  Service: Endoscopy;  Laterality: N/A;  . POLYPECTOMY      Current Outpatient Medications  Medication Sig Dispense Refill  . amLODipine (NORVASC) 5 MG tablet Take 1 tablet (5 mg total) by mouth daily. 30 tablet 3  . Cholecalciferol (VITAMIN D3) 2000 units TABS Take 1 capsule by mouth daily.     . Coenzyme Q10 (COQ10 PO) Take 10 mEq daily by mouth.    . diltiazem (CARDIZEM CD) 360 MG 24 hr capsule TAKE 1 CAPSULE BY MOUTH  DAILY 90 capsule 3  . diltiazem (CARDIZEM) 30 MG tablet Take 1 tablet by mouth every 4 hours AS NEEDED for heart rate >100 as long as blood pressure >100.    Marland Kitchen ezetimibe (ZETIA) 10 MG tablet Take 1 tablet (10 mg total) by mouth daily. 30 tablet 1  . ferrous sulfate (FERROUSUL) 325 (65 FE) MG tablet Take 1 tablet (325 mg total) by mouth daily with breakfast.  3  . glipiZIDE (GLUCOTROL XL) 10 MG 24 hr tablet Take 1 tablet (10 mg total) by mouth daily with breakfast. 30 tablet 1  . Insulin Pen Needle 32G X 4 MM MISC Use 1x a day 100 each 3  . Lancets (ONETOUCH ULTRASOFT) lancets Use to check blood sugar daily. 100 each 3  . levothyroxine (SYNTHROID, LEVOTHROID) 112 MCG tablet TAKE 1 TABLET BY MOUTH  DAILY 90 tablet 3  . liraglutide (VICTOZA) 18 MG/3ML SOPN Inject 0.3 mLs (1.8 mg  total) into the skin daily. 9 pen 3  . meclizine (ANTIVERT) 25 MG tablet Take 25 mg as needed by mouth.    . metFORMIN (GLUCOPHAGE-XR) 500 MG 24 hr tablet Take 4 tablets (2,000 mg total) by mouth daily with supper. 360 tablet 3  . metoprolol succinate (TOPROL-XL) 100 MG 24 hr tablet Take 1 tablet (100 mg total) 2 (two) times daily by mouth. Take with or immediately following a meal. 180 tablet 3  . Multiple Vitamin (MULTI-VITAMINS) TABS Take daily by mouth.    Marland Kitchen omeprazole (PRILOSEC)  40 MG capsule Take 1 capsule (40 mg total) by mouth daily. 90 capsule 3  . ONE TOUCH ULTRA TEST test strip TEST 3 TIMES DAILY 300 each 3  . rivaroxaban (XARELTO) 20 MG TABS tablet Take 1 tablet (20 mg total) by mouth daily with supper. 28 tablet 0  . triamcinolone cream (KENALOG) 0.1 % Apply 1 application topically 2 (two) times daily. 80 g 3  . allopurinol (ZYLOPRIM) 100 MG tablet Take 1 tablet (100 mg total) by mouth daily. (Patient not taking: Reported on 02/27/2017) 30 tablet 6  . nitroGLYCERIN (NITROSTAT) 0.4 MG SL tablet Place 1 tablet (0.4 mg total) under the tongue every 5 (five) minutes as needed for chest pain. (Patient not taking: Reported on 01/31/2017) 25 tablet 1   No current facility-administered medications for this encounter.     Allergies  Allergen Reactions  . Lisinopril Swelling    REACTION: swelling, may have had some breathing involvement.   . Nsaids Other (See Comments)    Patient reports internal bleeding  . Penicillins Shortness Of Breath and Swelling    Arm Swelling with Penicillin (Occurred in 1960s) Breathing - throat swelling with Amoxicillin (Occurred prior to 2002)  . Sulfamethoxazole Hives and Itching    "welps all over" immediately after dose  . Actos [Pioglitazone] Swelling    REACTION: swelling all over body  . Flecainide Other (See Comments)    Blurry  vision  . Gabapentin Other (See Comments)    Caused dysphora  . Iodinated Diagnostic Agents Itching    Pt. Developed mild  itching after receiving IV cm; pt. Held; Dr. Keane Scrape recomended she take 50 mg of benadryl when she goes home-if necessary; Dr Mickey Farber recommends benadryl prior to future exams requiring contrast media, but stated other doctors may recommend another premedication prep.  . Statins Other (See Comments)    Muscle aches with multiple statins  . Losartan Potassium Other (See Comments)    Lower extremity swelling    Social History   Socioeconomic History  . Marital status: Widowed    Spouse name: Iona Beard  . Number of children: 4  . Years of education: some colle  . Highest education level: Not on file  Social Needs  . Financial resource strain: Not on file  . Food insecurity - worry: Not on file  . Food insecurity - inability: Not on file  . Transportation needs - medical: Not on file  . Transportation needs - non-medical: Not on file  Occupational History  . Occupation: Management    Employer: UNEMPLOYED  Tobacco Use  . Smoking status: Former Smoker    Packs/day: 2.00    Years: 30.00    Pack years: 60.00    Types: Cigarettes    Last attempt to quit: 02/26/1993    Years since quitting: 24.0  . Smokeless tobacco: Never Used  Substance and Sexual Activity  . Alcohol use: No    Alcohol/week: 0.0 oz  . Drug use: No  . Sexual activity: Not Currently  Other Topics Concern  . Not on file  Social History Narrative  . Not on file    Family History  Problem Relation Age of Onset  . Heart disease Father   . Diabetes Maternal Grandfather   . Hypertension Mother   . Other Mother        brain tumor-benign  . Diabetes Daughter        pre-diabeties  . Colon polyps Daughter   . Thyroid disease Daughter  x 2  . Colon cancer Neg Hx     ROS- All systems are reviewed and negative except as per the HPI above  Physical Exam: Vitals:   02/27/17 0909  BP: (!) 144/78  Pulse: 78  Weight: 235 lb 12.8 oz (107 kg)  Height: 5\' 5"  (1.651 m)    GEN- The patient is well appearing,  alert and oriented x 3 today.   Head- normocephalic, atraumatic Eyes-  Sclera clear, conjunctiva pink Ears- hearing intact Oropharynx- clear Neck- supple, no JVP Lymph- no cervical lymphadenopathy Lungs- Clear to ausculation bilaterally, normal work of breathing Heart- Regular rate and rhythm, no murmurs, rubs or gallops, PMI not laterally displaced GI- soft, NT, ND, + BS Extremities- no clubbing, cyanosis, or edema MS- no significant deformity or atrophy Skin- no rash or lesion Psych- euthymic mood, full affect Neuro- strength and sensation are intact  EKG-NSR at 78 bpm, Pr int 246 ms, QRS int 94 ms, qtc 462 ms   Assessment and Plan: 1. Symptomatic paroxysmal AFIb Currently afib burden is low and confirmed with event moniotor Ablation discussed with Dr. Rayann Heman  In June and wait and see approach decided, he wanted her to have sleep study, did mention sotalol may be posssibility Continue xarelto for chadsvasc score of at least 6  Continue dilt/BB, for breakthrough afib, cardizem 30 mg as needed  2. HTN stable  3. Obesity Exercise, weight loss encouraged  4. DM Pt is working on better glycemic control  5. OSA Pending sleep study  F/u with Dr. Johnsie Cancel in 6 months  afib clinic as needed  Geroge Baseman. Dhwani Venkatesh, [facility] [address] [place], [place] [address] [phone]

## 2017-02-28 ENCOUNTER — Ambulatory Visit: Payer: Medicare Other

## 2017-03-05 ENCOUNTER — Other Ambulatory Visit: Payer: Self-pay | Admitting: Family Medicine

## 2017-03-05 DIAGNOSIS — E785 Hyperlipidemia, unspecified: Principal | ICD-10-CM

## 2017-03-05 DIAGNOSIS — E1169 Type 2 diabetes mellitus with other specified complication: Secondary | ICD-10-CM

## 2017-03-05 MED ORDER — EZETIMIBE 10 MG PO TABS: 10.0000 mg | ORAL_TABLET | Freq: Every day | ORAL | 3 refills | Status: DC

## 2017-03-12 ENCOUNTER — Ambulatory Visit: Payer: Medicare Other | Admitting: Physician Assistant

## 2017-03-13 ENCOUNTER — Ambulatory Visit: Payer: Medicare Other | Admitting: Family Medicine

## 2017-03-13 ENCOUNTER — Other Ambulatory Visit: Payer: Self-pay

## 2017-03-13 ENCOUNTER — Encounter: Payer: Self-pay | Admitting: Family Medicine

## 2017-03-13 DIAGNOSIS — R109 Unspecified abdominal pain: Secondary | ICD-10-CM | POA: Insufficient documentation

## 2017-03-13 DIAGNOSIS — B0229 Other postherpetic nervous system involvement: Secondary | ICD-10-CM | POA: Insufficient documentation

## 2017-03-13 DIAGNOSIS — D500 Iron deficiency anemia secondary to blood loss (chronic): Secondary | ICD-10-CM | POA: Diagnosis not present

## 2017-03-13 DIAGNOSIS — E038 Other specified hypothyroidism: Secondary | ICD-10-CM | POA: Diagnosis not present

## 2017-03-13 DIAGNOSIS — R740 Nonspecific elevation of levels of transaminase and lactic acid dehydrogenase [LDH]: Secondary | ICD-10-CM | POA: Diagnosis not present

## 2017-03-13 DIAGNOSIS — B029 Zoster without complications: Secondary | ICD-10-CM | POA: Insufficient documentation

## 2017-03-13 DIAGNOSIS — R079 Chest pain, unspecified: Secondary | ICD-10-CM

## 2017-03-13 DIAGNOSIS — M1A09X0 Idiopathic chronic gout, multiple sites, without tophus (tophi): Secondary | ICD-10-CM | POA: Diagnosis not present

## 2017-03-13 DIAGNOSIS — R7401 Elevation of levels of liver transaminase levels: Secondary | ICD-10-CM

## 2017-03-13 LAB — POCT URINALYSIS DIP (MANUAL ENTRY)
Blood, UA: NEGATIVE
Glucose, UA: NEGATIVE mg/dL
Nitrite, UA: NEGATIVE
Protein Ur, POC: 100 mg/dL — AB
Spec Grav, UA: >=1.03 — AB (ref 1.010–1.025)
Urobilinogen, UA: 0.2 E.U./dL
pH, UA: 5.5 (ref 5.0–8.0)

## 2017-03-13 NOTE — Patient Instructions (Signed)
I will call with lab test results and we will together decide to do next.

## 2017-03-14 ENCOUNTER — Ambulatory Visit
Admission: RE | Admit: 2017-03-14 | Discharge: 2017-03-14 | Disposition: A | Discharge status: Home / Self Care | Payer: Medicare Other | Source: Ambulatory Visit | Attending: Family Medicine | Admitting: Family Medicine

## 2017-03-14 ENCOUNTER — Encounter: Payer: Self-pay | Admitting: Family Medicine

## 2017-03-14 DIAGNOSIS — R079 Chest pain, unspecified: Secondary | ICD-10-CM

## 2017-03-14 LAB — CBC
Hematocrit: 34.2 % (ref 34.0–46.6)
Hemoglobin: 10.8 g/dL — ABNORMAL LOW (ref 11.1–15.9)
MCH: 25.4 pg — ABNORMAL LOW (ref 26.6–33.0)
MCHC: 31.6 g/dL (ref 31.5–35.7)
MCV: 80 fL (ref 79–97)
Platelets: 311 10*3/uL (ref 150–379)
RBC: 4.26 x10E6/uL (ref 3.77–5.28)
RDW: 18.4 % — ABNORMAL HIGH (ref 12.3–15.4)
WBC: 6.8 10*3/uL (ref 3.4–10.8)

## 2017-03-14 LAB — CMP14+EGFR
ALT: 37 IU/L — ABNORMAL HIGH (ref 0–32)
AST: 25 IU/L (ref 0–40)
Albumin/Globulin Ratio: 1.5 (ref 1.2–2.2)
Albumin: 4.6 g/dL (ref 3.5–4.8)
Alkaline Phosphatase: 105 IU/L (ref 39–117)
BUN/Creatinine Ratio: 20 (ref 12–28)
BUN: 20 mg/dL (ref 8–27)
Bilirubin Total: <0.2 mg/dL (ref 0.0–1.2)
CO2: 22 mmol/L (ref 20–29)
Calcium: 10.2 mg/dL (ref 8.7–10.3)
Chloride: 98 mmol/L (ref 96–106)
Creatinine, Ser: 1.02 mg/dL — ABNORMAL HIGH (ref 0.57–1.00)
GFR calc Af Amer: 62 mL/min/{1.73_m2} (ref >59)
GFR calc non Af Amer: 54 mL/min/{1.73_m2} — ABNORMAL LOW (ref >59)
Globulin, Total: 3 g/dL (ref 1.5–4.5)
Glucose: 249 mg/dL — ABNORMAL HIGH (ref 65–99)
Potassium: 4.9 mmol/L (ref 3.5–5.2)
Sodium: 138 mmol/L (ref 134–144)
Total Protein: 7.6 g/dL (ref 6.0–8.5)

## 2017-03-14 LAB — URIC ACID: Uric Acid: 7.6 mg/dL — ABNORMAL HIGH (ref 2.5–7.1)

## 2017-03-14 LAB — TSH: TSH: 3.79 u[IU]/mL (ref 0.450–4.500)

## 2017-03-14 NOTE — Assessment & Plan Note (Signed)
TSH now normal since she is taking meds regularly.

## 2017-03-14 NOTE — Progress Notes (Signed)
   Subjective:    Patient ID: Courtney Pena, female    DOB: [birth year], 77 y.o.   MRN: [id number]  HPI Multiple issues: 1. She has had right flank/CVA pain for a few months now.  Radiates to right groin.  Seems to be worsening.  No nausea, vomiting, change in bowel or bladder.  No bleeding with stool or urine.  No fever, night sweats or wt loss.  She has already scheduled an appointment with [facility]. 2. Left sided mid thoracic back pain posterior.  No SOB.  No pain on breathing.  Wonders if it is the same side as her prior lung nodule (no that was on left.)  3. DM back on victoza.  Worried about cost - she is on two high cost meds, victoza and eliquis.  Worries she will be in donut hole again and soon.   4. Hx of hypothyroidism previously.  Last TSH a bit high, but she was not taking everyday.    Review of Systems     Objective:   Physical Exam  No thyroid nodule LUngs clear Cardiac RRR without m or g Abd benign. Back, mild right CVA tenderness.        Assessment & Plan:

## 2017-03-14 NOTE — Assessment & Plan Note (Signed)
CBC unchanged.  With iron, she is maintaining despite eliquis.

## 2017-03-14 NOTE — Assessment & Plan Note (Signed)
Diff dx is musculoskeletal, renal (nephrolithiasis) and GI (Hepatic).  UA, CMP and CBC all OK.  Discussed per phone.  She will keep appointment with GI and decide with them whether further testing is warranted.

## 2017-03-14 NOTE — Assessment & Plan Note (Addendum)
Previous fatty liver.  Recheck transaminases.  Results only mild elevation.

## 2017-03-19 ENCOUNTER — Ambulatory Visit
Admission: RE | Admit: 2017-03-19 | Discharge: 2017-03-19 | Disposition: A | Discharge status: Home / Self Care | Payer: Medicare Other | Source: Ambulatory Visit | Attending: Family Medicine | Admitting: Family Medicine

## 2017-03-19 ENCOUNTER — Ambulatory Visit: Payer: Medicare Other

## 2017-03-19 DIAGNOSIS — Z1231 Encounter for screening mammogram for malignant neoplasm of breast: Secondary | ICD-10-CM

## 2017-03-20 ENCOUNTER — Other Ambulatory Visit (INDEPENDENT_AMBULATORY_CARE_PROVIDER_SITE_OTHER): Payer: Medicare Other

## 2017-03-20 ENCOUNTER — Ambulatory Visit: Payer: Medicare Other | Admitting: Physician Assistant

## 2017-03-20 ENCOUNTER — Encounter: Payer: Self-pay | Admitting: Physician Assistant

## 2017-03-20 VITALS — BP 154/80 | HR 84 | Ht 64.5 in | Wt 238.0 lb

## 2017-03-20 DIAGNOSIS — Z7901 Long term (current) use of anticoagulants: Secondary | ICD-10-CM

## 2017-03-20 DIAGNOSIS — R109 Unspecified abdominal pain: Secondary | ICD-10-CM

## 2017-03-20 DIAGNOSIS — M546 Pain in thoracic spine: Secondary | ICD-10-CM | POA: Diagnosis not present

## 2017-03-20 DIAGNOSIS — G8929 Other chronic pain: Secondary | ICD-10-CM

## 2017-03-20 DIAGNOSIS — R1011 Right upper quadrant pain: Secondary | ICD-10-CM

## 2017-03-20 DIAGNOSIS — R1031 Right lower quadrant pain: Secondary | ICD-10-CM

## 2017-03-20 LAB — BASIC METABOLIC PANEL
BUN: 18 mg/dL (ref 6–23)
CO2: 26 mEq/L (ref 19–32)
Calcium: 9.6 mg/dL (ref 8.4–10.5)
Chloride: 102 mEq/L (ref 96–112)
Creatinine, Ser: 0.93 mg/dL (ref 0.40–1.20)
GFR: 75.2 mL/min (ref >60.00)
Glucose, Bld: 234 mg/dL — ABNORMAL HIGH (ref 70–99)
Potassium: 4.7 mEq/L (ref 3.5–5.1)
Sodium: 136 mEq/L (ref 135–145)

## 2017-03-20 MED ORDER — PREDNISONE 50 MG PO TABS
ORAL_TABLET | ORAL | 0 refills | Status: DC
Start: 2017-03-20 — End: 2017-04-25

## 2017-03-20 NOTE — Progress Notes (Signed)
Subjective:    Patient ID: Courtney Pena, female    DOB: 1940/09/14, 77 y.o.   MRN: [id number]  HPI Baljit is a 77 year old African-American female, known to Dr. Silverio Decamp who is referred back today by Dr. Andria Frames for evaluation of right-sided abdominal pain. She was seen by primary care about 2 weeks ago and had labs done including a UA. WBC was normal, hemoglobin 10.8 hematocrit of 34.2 MCV of 80, liver function studies normal, creatinine 1.0 and UA negative for infection. She's not had any recent abdominal imaging. She had undergone an EGD here in May 2017 because of history of gastric AVMs. This was a normal exam with no evidence of gave. Last colonoscopy April 2012 showed moderate sigmoid diverticulosis she had a 5 mm polyp removed which returned showing benign colonic mucosa.  Patient has history of hypertension, coronary artery disease, atrial fibrillation for which she is on Xarelto, sleep apnea, adult-onset diabetes mellitus and morbid  Obesity.  She says her current pain has been present over the past few months, and berries in intensity but never completely goes away. She is unaware of any association with eating or bowel movements. She does not feel that the pain is positional although she says sometimes it is aggravated by lying on her left side. Appetite has been fine weight has been stable, no nausea or vomiting, no fever or chills. Bowel movements are somewhat "sluggish" but no recent changes, no melena or hematochezia. She also describes some fullness in the upper abdomen which she says pushes up into her chest at times She says she's not sure if that is secondary to her heart problems or not. \No dysuria No regular aspirin or NSAIDs.  Review of Systems Pertinent positive and negative review of systems were noted in the above HPI section.  All other review of systems was otherwise negative.  Outpatient Encounter Medications as of 03/20/2017  Medication Sig  . amLODipine  (NORVASC) 5 MG tablet Take 1 tablet (5 mg total) by mouth daily.  . Cholecalciferol (VITAMIN D3) 2000 units TABS Take 1 capsule by mouth daily.   . Coenzyme Q10 (COQ10 PO) Take 10 mEq daily by mouth.  . diltiazem (CARDIZEM CD) 360 MG 24 hr capsule TAKE 1 CAPSULE BY MOUTH  DAILY  . diltiazem (CARDIZEM) 30 MG tablet Take 1 tablet by mouth every 4 hours AS NEEDED for heart rate >100 as long as blood pressure >100.  Marland Kitchen ezetimibe (ZETIA) 10 MG tablet Take 1 tablet (10 mg total) by mouth daily.  . ferrous sulfate (FERROUSUL) 325 (65 FE) MG tablet Take 1 tablet (325 mg total) by mouth daily with breakfast.  . glipiZIDE (GLUCOTROL XL) 10 MG 24 hr tablet Take 1 tablet (10 mg total) by mouth daily with breakfast.  . Insulin Pen Needle 32G X 4 MM MISC Use 1x a day  . Lancets (ONETOUCH ULTRASOFT) lancets Use to check blood sugar daily.  Marland Kitchen levothyroxine (SYNTHROID, LEVOTHROID) 112 MCG tablet TAKE 1 TABLET BY MOUTH  DAILY  . liraglutide (VICTOZA) 18 MG/3ML SOPN Inject 0.3 mLs (1.8 mg total) into the skin daily.  . meclizine (ANTIVERT) 25 MG tablet Take 25 mg as needed by mouth.  . metFORMIN (GLUCOPHAGE-XR) 500 MG 24 hr tablet Take 4 tablets (2,000 mg total) by mouth daily with supper.  . metoprolol succinate (TOPROL-XL) 100 MG 24 hr tablet Take 1 tablet (100 mg total) 2 (two) times daily by mouth. Take with or immediately following a meal.  . Multiple Vitamin (MULTI-VITAMINS)  TABS Take daily by mouth.  . nitroGLYCERIN (NITROSTAT) 0.4 MG SL tablet Place 1 tablet (0.4 mg total) under the tongue every 5 (five) minutes as needed for chest pain.  Marland Kitchen omeprazole (PRILOSEC) 40 MG capsule Take 1 capsule (40 mg total) by mouth daily.  . ONE TOUCH ULTRA TEST test strip TEST 3 TIMES DAILY  . rivaroxaban (XARELTO) 20 MG TABS tablet Take 1 tablet (20 mg total) by mouth daily with supper.  . triamcinolone cream (KENALOG) 0.1 % Apply 1 application topically 2 (two) times daily.  Marland Kitchen allopurinol (ZYLOPRIM) 100 MG tablet Take 1  tablet (100 mg total) by mouth daily. (Patient not taking: Reported on 03/20/2017)  . predniSONE (DELTASONE) 50 MG tablet Take 1 tab   No facility-administered encounter medications on file as of 03/20/2017.    Allergies  Allergen Reactions  . Lisinopril Swelling    REACTION: swelling, may have had some breathing involvement.   . Nsaids Other (See Comments)    Patient reports internal bleeding  . Penicillins Shortness Of Breath and Swelling    Arm Swelling with Penicillin (Occurred in 1960s) Breathing - throat swelling with Amoxicillin (Occurred prior to 2002)  . Sulfamethoxazole Hives and Itching    "welps all over" immediately after dose  . Actos [Pioglitazone] Swelling    REACTION: swelling all over body  . Flecainide Other (See Comments)    Blurry  vision  . Gabapentin Other (See Comments)    Caused dysphora  . Iodinated Diagnostic Agents Itching    Pt. Developed mild itching after receiving IV cm; pt. Held; Dr. Keane Scrape recomended she take 50 mg of benadryl when she goes home-if necessary; Dr Mickey Farber recommends benadryl prior to future exams requiring contrast media, but stated other doctors may recommend another premedication prep.  . Statins Other (See Comments)    Muscle aches with multiple statins  . Losartan Potassium Other (See Comments)    Lower extremity swelling   Patient Active Problem List   Diagnosis Date Noted  . Right flank pain 03/13/2017  . Hereditary and idiopathic peripheral neuropathy 10/17/2016  . Transaminitis 11/10/2015  . Chronic anticoagulation - Xarelto started 07/07/2015, CHADS2CVASC=5 07/07/2015  . Grief 06/24/2015  . Left sided chest pain 05/12/2015  . Subcutaneous cyst 07/09/2014  . Fatigue 06/24/2014  . Chronic gout of multiple sites 03/23/2014  . Degenerative arthritis of thumb 02/12/2014  . Unspecified vitamin D deficiency 09/18/2013  . Hyperlipidemia associated with type 2 diabetes mellitus ([place]) 08/22/2012  . Nummular eczema 10/10/2011   . 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 Anemia due to GI blood loss 08/12/2011  . Pulmonary nodule 12/26/2010  . Paroxysmal atrial fibrillation ([place]) 12/14/2010  . Hypothyroidism 04/25/2006  . Type 2 diabetes mellitus with diabetic chronic kidney disease ([place]) 04/25/2006  . HYPERCHOLESTEROLEMIA 04/25/2006  . Morbid obesity ([place]) 04/25/2006  . HYPERTENSION, BENIGN SYSTEMIC 04/25/2006  . Coronary atherosclerosis 04/25/2006  . Reflux esophagitis 04/25/2006  . DIVERTICULOSIS OF COLON 04/25/2006  . DJD, UNSPECIFIED 04/25/2006  . VERTIGO NOS OR DIZZINESS 04/25/2006  . Sleep apnea 04/25/2006   Social History   Socioeconomic History  . Marital status: Widowed    Spouse name: Iona Beard  . Number of children: 4  . Years of education: some colle  . Highest education level: Not on file  Social Needs  . Financial resource strain: Not on file  . Food insecurity - worry: Not on file  . Food insecurity - inability: Not on file  . Transportation needs - medical: Not  on file  . Transportation needs - non-medical: Not on file  Occupational History  . Occupation: Management    Employer: UNEMPLOYED  Tobacco Use  . Smoking status: Former Smoker    Packs/day: 2.00    Years: 30.00    Pack years: 60.00    Types: Cigarettes    Last attempt to quit: 02/26/1993    Years since quitting: 24.0  . Smokeless tobacco: Never Used  Substance and Sexual Activity  . Alcohol use: No    Alcohol/week: 0.0 oz  . Drug use: No  . Sexual activity: Not Currently  Other Topics Concern  . Not on file  Social History Narrative  . Not on file    [name]'s family history includes Colon polyps in her daughter; Diabetes in her daughter and maternal grandfather; Heart disease in her father; Hypertension in her mother; Other in her mother; Thyroid disease in her daughter.      Objective:    Vitals:   03/20/17 0906  BP: (!) 154/80  Pulse: 84    Physical Exam well-developed older  African-American female in no acute distress, pleasant blood pressure 154/80 pulse 84, BMI 40.2. HEENT ;nontraumatic normocephalic EOMI PERRLA sclera anicteric, Cardiovascular ;regular rate and rhythm with S1-S2, Pulmonary; clear bilaterally, Abdomen;; soft, she has some tenderness with palpation of the right flank, right upper quadrant right mid quadrant and right lower quadrant, there is no palpable mass or hepatosplenomegaly no guarding bowel sounds are present, Rectal; exam not done, Extremities ;no clubbing cyanosis or edema skin warm and dry, Neuropsych; mood and affect appropriate       Assessment & Plan:   #52 77 year old African-American female with 3-4 month history of right flank and right upper right mid and right lower quadrant pain of unclear etiology. Rule out malignancy or other intra-abdominal inflammatory process, consider musculoskeletal etiology i.e. radicular though no history of significant disc disease. #2 diverticulosis #3 history of atrial fibrillation #4 chronic anticoagulation, on Xarelto #5 coronary artery disease #6 hypertension #7 adult-onset diabetes mellitus   #8 morbid obesity  Plan; schedule for CT of the abdomen and pelvis with contrast She will use Tylenol as needed for pain Continue omeprazole 40 mg by mouth every morning Further plans and decisions regarding possible colonoscopy pending results of CT.  Chrisanna Mishra Genia Harold PA-C 03/20/2017   Cc: [name], MD

## 2017-03-20 NOTE — Patient Instructions (Addendum)
Please go to the basement level to have your labs drawn.  You have been scheduled for a CT scan of the abdomen and pelvis at [facility] ([address]---this is in the same building as Press photographer).   You are scheduled on Friday 1-25 at 8:30 am. You should arrive at 8:15 minutes prior to your appointment time for registration. Please follow the written instructions below on the day of your exam:  WARNING: IF YOU ARE ALLERGIC TO IODINE/X-RAY DYE, PLEASE NOTIFY RADIOLOGY IMMEDIATELY AT [phone]! YOU WILL BE GIVEN A 13 HOUR PREMEDICATION PREP.  1) Do not eat anything after 4:30 am (4 hours prior to your test) 2) You have been given 2 bottles of oral contrast to drink. The solution may taste               better if refrigerated, but do NOT add ice or any other liquid to this solution. Shake             well before drinking.    Drink 1 bottle of contrast @ 6:30 am (2 hours prior to your exam)  Drink 1 bottle of contrast @ 7:30 am (1 hour prior to your exam)  You may take any medications as prescribed with a small amount of water except for the following: Metformin, Glucophage, Glucovance, Avandamet, Riomet, Fortamet, Actoplus Met, Janumet, Glumetza or Metaglip. The above medications must be held the day of the exam AND 48 hours after the exam.  The purpose of you drinking the oral contrast is to aid in the visualization of your intestinal tract. The contrast solution may cause some diarrhea. Before your exam is started, you will be given a small amount of fluid to drink. Depending on your individual set of symptoms, you may also receive an intravenous injection of x-ray contrast/dye. Plan on being at [facility] for 30 minutes or longer, depending on the type of exam you are having performed.  This test typically takes 30-45 minutes to complete.  If you have any questions regarding your exam or if you need to reschedule, you may call the CT department at [phone]  between the hours of 8:00 am and 5:00 pm, Monday-Friday.  Take Prednisone 50 mg 1 tablet at 7:30 PM pm Thursday night 1-24. Take Prednisone 50 mg 1 tablet at 1:30 AM on Friday 1-25  Take Prednisone 50 mg 1 tablet at 7:30 am on Friday 1-25 Take 50 mg of Benedryl  ( 2 tabs of 25 mg) at 7:30  Am Friday 1-25.

## 2017-03-22 ENCOUNTER — Other Ambulatory Visit: Payer: Medicare Other

## 2017-03-22 ENCOUNTER — Ambulatory Visit (INDEPENDENT_AMBULATORY_CARE_PROVIDER_SITE_OTHER)
Admission: RE | Admit: 2017-03-22 | Discharge: 2017-03-22 | Disposition: A | Discharge status: Home / Self Care | Payer: Medicare Other | Source: Ambulatory Visit | Attending: Physician Assistant | Admitting: Physician Assistant

## 2017-03-22 DIAGNOSIS — R1011 Right upper quadrant pain: Secondary | ICD-10-CM | POA: Diagnosis not present

## 2017-03-22 DIAGNOSIS — G8929 Other chronic pain: Secondary | ICD-10-CM

## 2017-03-22 DIAGNOSIS — R109 Unspecified abdominal pain: Secondary | ICD-10-CM | POA: Diagnosis not present

## 2017-03-22 DIAGNOSIS — R1031 Right lower quadrant pain: Secondary | ICD-10-CM

## 2017-03-22 DIAGNOSIS — M546 Pain in thoracic spine: Secondary | ICD-10-CM

## 2017-03-22 MED ORDER — PREDNISONE 1 MG PO TABS: 50.0000 mg | ORAL_TABLET | Freq: Four times a day (QID) | ORAL | Status: DC

## 2017-03-22 MED ORDER — IOPAMIDOL (ISOVUE-300) INJECTION 61%
100.0000 mL | Freq: Once | INTRAVENOUS | Status: AC | PRN
  Administered 2017-03-22: 100 mL via INTRAVENOUS

## 2017-03-22 MED ORDER — DIPHENHYDRAMINE HCL 50 MG/ML IJ SOLN: 50.0000 mg | Freq: Once | INTRAMUSCULAR | Status: DC

## 2017-03-22 MED ORDER — DIPHENHYDRAMINE HCL 25 MG PO CAPS: 50.0000 mg | ORAL_CAPSULE | Freq: Once | ORAL | Status: DC

## 2017-03-22 NOTE — Progress Notes (Signed)
Reviewed and agree with documentation and assessment and plan. K. Veena Nandigam , MD   

## 2017-03-27 ENCOUNTER — Other Ambulatory Visit: Payer: Self-pay | Admitting: *Deleted

## 2017-03-27 DIAGNOSIS — I10 Essential (primary) hypertension: Secondary | ICD-10-CM

## 2017-03-28 MED ORDER — AMLODIPINE BESYLATE 5 MG PO TABS: 5.0000 mg | ORAL_TABLET | Freq: Every day | ORAL | 3 refills | Status: DC

## 2017-03-29 ENCOUNTER — Other Ambulatory Visit: Payer: Self-pay

## 2017-03-29 DIAGNOSIS — R1011 Right upper quadrant pain: Secondary | ICD-10-CM

## 2017-04-03 ENCOUNTER — Ambulatory Visit: Payer: Medicare Other | Admitting: Family Medicine

## 2017-04-10 ENCOUNTER — Ambulatory Visit (HOSPITAL_COMMUNITY): Payer: Medicare Other

## 2017-04-10 ENCOUNTER — Ambulatory Visit (HOSPITAL_COMMUNITY)
Admission: RE | Admit: 2017-04-10 | Discharge: 2017-04-10 | Disposition: A | Discharge status: Home / Self Care | Payer: Medicare Other | Source: Ambulatory Visit | Attending: Physician Assistant | Admitting: Physician Assistant

## 2017-04-10 DIAGNOSIS — R1011 Right upper quadrant pain: Secondary | ICD-10-CM | POA: Insufficient documentation

## 2017-04-10 DIAGNOSIS — R9389 Abnormal findings on diagnostic imaging of other specified body structures: Secondary | ICD-10-CM | POA: Insufficient documentation

## 2017-04-10 MED ORDER — TECHNETIUM TC 99M MEBROFENIN IV KIT
5.1000 | PACK | Freq: Once | INTRAVENOUS | Status: AC | PRN
  Administered 2017-04-10: 5.1 via INTRAVENOUS

## 2017-04-16 ENCOUNTER — Ambulatory Visit: Payer: Medicare Other | Admitting: Family Medicine

## 2017-04-16 NOTE — Progress Notes (Signed)
Patient ID: Courtney Pena, female   DOB: 1940-05-23, 77 y.o.   MRN: [id number]     Cardiology Office Note   Date:  04/25/2017   ID:  Courtney Pena, DOB 26-Oct-1940, MRN [id number]  PCP:  [name], MD  Cardiologist:  Dr Oswaldo Conroy, MD   No chief complaint on file.   History of Present Illness:  77 y.o. with HTN, DM, HLD, Obesity OSA. Long history of PAF. On xarelto held in past for gastric ectasia that was ablated.  CHADS2VASC 5. Intolerant to flecainide with visual changes. Has had some breakthrough PAF Seen by [facility] discussed Ablation but decided not to pursue His note mentons sotolol as possible AAT. She takes SA cardizem for palpitations now  Event monitor 01/03/17 no PaF even with palpitations Myovue 07/02/16 normal no ischemia EF 59% Echo 2017 LA normal size 36 mm  Husband passed in March 2017 BS does better on vegan diet   She has had some RUQ pain and is seeing GI for ? Dysfunctional GB No palpitations no chest pain chronic functional dyspnea from obesity   Past Medical History:  Diagnosis Date  . Allergy   . Anemia due to GI blood loss 08/12/2011  . Arthritis   . Asthma   . Atrial flutter ([place]) 12-07-2010   converted in ED with 300 mg flecainide  . CAD (coronary artery disease)    a. mild per cath in 2004;  b. nonischemic Myoview in March 2012;  c. Lex MV 1/14:  EF 66%, no ischemia  . Chronic anticoagulation - Xarelto started 07/07/2015, CHADS2CVASC=5 07/07/2015  . External hemorrhoids 06/07/2010  . Gastric antral vascular ectasia    source for gi bleed in 07/2011 - Xarelto stopped  . Gastritis   . GERD (gastroesophageal reflux disease)   . Hiatal hernia   . Hyperlipidemia   . Hypertension   . Hypothyroidism   . Neuromuscular disorder ([place])   . Obesity   . Paroxysmal atrial fibrillation (H[facility])   . Personal history of colonic polyps 06/06/2009   cecal polyp  . Sleep apnea    does not use CPAP  . Type 2 diabetes mellitus  with diabetic chronic kidney disease ([place]) 04/25/2006      . Ulcer     Past Surgical History:  Procedure Laterality Date  . BREAST BIOPSY Left   . BREAST EXCISIONAL BIOPSY Left    benign  . CARDIAC CATHETERIZATION  1999&2004  . CARPAL TUNNEL RELEASE Bilateral 2003  . CATARACT EXTRACTION Bilateral   . COLONOSCOPY    . ESOPHAGOGASTRODUODENOSCOPY  08/13/2011   Procedure: ESOPHAGOGASTRODUODENOSCOPY (EGD);  Surgeon: Gatha Mayer, MD;  Location: [facility] ENDOSCOPY;  Service: Endoscopy;  Laterality: N/A;  . POLYPECTOMY      Current Outpatient Medications  Medication Sig Dispense Refill  . amLODipine (NORVASC) 5 MG tablet Take 1 tablet (5 mg total) by mouth daily. 90 tablet 3  . Cholecalciferol (VITAMIN D3) 2000 units TABS Take 1 capsule by mouth daily.     . Coenzyme Q10 (COQ10 PO) Take 10 mEq daily by mouth.    . diltiazem (CARDIZEM CD) 360 MG 24 hr capsule TAKE 1 CAPSULE BY MOUTH  DAILY 90 capsule 3  . diltiazem (CARDIZEM) 30 MG tablet Take 1 tablet by mouth every 4 hours AS NEEDED for heart rate >100 as long as blood pressure >100.    Marland Kitchen ezetimibe (ZETIA) 10 MG tablet Take 1 tablet (10 mg total) by mouth daily. 90 tablet 3  .  ferrous sulfate (FERROUSUL) 325 (65 FE) MG tablet Take 1 tablet (325 mg total) by mouth daily with breakfast.  3  . glipiZIDE (GLUCOTROL XL) 10 MG 24 hr tablet Take 1 tablet (10 mg total) by mouth daily with breakfast. 30 tablet 1  . Insulin Pen Needle 32G X 4 MM MISC Use 1x a day 100 each 3  . Lancets (ONETOUCH ULTRASOFT) lancets Use to check blood sugar daily. 100 each 3  . levothyroxine (SYNTHROID, LEVOTHROID) 112 MCG tablet TAKE 1 TABLET BY MOUTH  DAILY 90 tablet 3  . liraglutide (VICTOZA) 18 MG/3ML SOPN Inject 0.3 mLs (1.8 mg total) into the skin daily. 9 pen 3  . meclizine (ANTIVERT) 25 MG tablet Take 25 mg as needed by mouth.    . metFORMIN (GLUCOPHAGE-XR) 500 MG 24 hr tablet Take 4 tablets (2,000 mg total) by mouth daily with supper. 360 tablet 3  . metoprolol  succinate (TOPROL-XL) 100 MG 24 hr tablet Take 1 tablet (100 mg total) 2 (two) times daily by mouth. Take with or immediately following a meal. 180 tablet 3  . Multiple Vitamin (MULTI-VITAMINS) TABS Take daily by mouth.    . nitroGLYCERIN (NITROSTAT) 0.4 MG SL tablet Place 1 tablet (0.4 mg total) under the tongue every 5 (five) minutes as needed for chest pain. 25 tablet 1  . omeprazole (PRILOSEC) 40 MG capsule Take 1 capsule (40 mg total) by mouth daily. 90 capsule 3  . ONE TOUCH ULTRA TEST test strip TEST 3 TIMES DAILY 300 each 3  . rivaroxaban (XARELTO) 20 MG TABS tablet Take 1 tablet (20 mg total) by mouth daily with supper. 28 tablet 0  . triamcinolone cream (KENALOG) 0.1 % Apply 1 application topically 2 (two) times daily. 80 g 3   No current facility-administered medications for this visit.     Allergies:   Lisinopril; Nsaids; Penicillins; Sulfamethoxazole; Actos [pioglitazone]; Flecainide; Gabapentin; Iodinated diagnostic agents; Statins; and Losartan potassium    Social History:  The patient  reports that she quit smoking about 24 years ago. Her smoking use included cigarettes. She has a 60.00 pack-year smoking history. she has never used smokeless tobacco. She reports that she does not drink alcohol or use drugs.   Family History:  The patient's family history includes Colon polyps in her daughter; Diabetes in her daughter and maternal grandfather; Heart disease in her father; Hypertension in her mother; Other in her mother; Thyroid disease in her daughter.    ROS:  Please see the history of present illness. All other systems are reviewed and negative.    PHYSICAL EXAM: VS:  BP 126/74   Pulse 78   Ht 5\' 5"  (1.651 m)   Wt 231 lb (104.8 kg)   SpO2 99%   BMI 38.44 kg/m  , BMI Body mass index is 38.44 kg/m. Affect appropriate Overweight black female  HEENT: normal Neck supple with no adenopathy JVP normal no bruits no thyromegaly Lungs clear with no wheezing and good  diaphragmatic motion Heart:  S1/S2 SEM murmur, no rub, gallop or click PMI normal Abdomen: benighn, BS positve, no tenderness, no AAA no bruit.  No HSM or HJR Distal pulses intact with no bruits No edema Neuro non-focal Skin warm and dry No muscular weakness   EKG:   07/07/15  sinus rhythm with borderline first-degree AV block, PR 210 ms  08/05/15  SR rate 82  Low voltage PR 208 msec poor R wave progression 01/03/17 SR rate 108 low voltage normal PR 208  Recent Labs: 03/13/2017: ALT 37; Hemoglobin 10.8; Platelets 311; TSH 3.790 03/20/2017: BUN 18; Creatinine, Ser 0.93; Potassium 4.7; Sodium 136    Lipid Panel    Component Value Date/Time   CHOL 270 (H) 05/03/2016 1412   TRIG 152 (H) 05/03/2016 1412   HDL 69 05/03/2016 1412   CHOLHDL 3.9 05/03/2016 1412   CHOLHDL 4.7 01/27/2015 1238   VLDL 28 01/27/2015 1238   LDLCALC 171 (H) 05/03/2016 1412   LDLDIRECT 172 (H) 01/24/2017 1008   LDLDIRECT 178 (H) 05/12/2015 1229     Wt Readings from Last 3 Encounters:  04/25/17 231 lb (104.8 kg)  03/20/17 238 lb (108 kg)  03/13/17 234 lb 3.2 oz (106.2 kg)     Other studies Reviewed: Additional studies/ records that were reviewed today include: Office notes, hospital records and testing  ASSESSMENT AND PLAN:  1.  Paroxysmal atrial fibrillation: intolerant to flecainide In NSR palpitations don't correlate with PAF on event monitor Consider betapace as AAT or ablation in future    2. Anticoagulation: CHADS2VASC=5. On xarelto   3. HTN:  Well controlled.  Continue current medications and low sodium Dash type diet.    4. Diastolic Dysfunction:  Stable needs to lose weight  5. DM:  Major issue poorly controlled  Lab Results  Component Value Date   HGBA1C 9.0 01/24/2017   Victosa was effective but she couldn't afford it Januvia ineffective Refuses insulin f/u G[place]ge And continue metformin, glipizide and Trulicity with vegan diet   6. Chest Pain Atypical Myovue 07/02/16 normal  No  ischemia  EF 59%   7. Biliary cholic:  F/u GI HIDA with Low GB EF indicative of biliary dyskinesia  [name]

## 2017-04-18 ENCOUNTER — Ambulatory Visit (INDEPENDENT_AMBULATORY_CARE_PROVIDER_SITE_OTHER): Payer: Medicare Other | Admitting: Family Medicine

## 2017-04-18 DIAGNOSIS — E1122 Type 2 diabetes mellitus with diabetic chronic kidney disease: Secondary | ICD-10-CM

## 2017-04-19 NOTE — Progress Notes (Signed)
Patient left without being seen.

## 2017-04-25 ENCOUNTER — Ambulatory Visit: Payer: Medicare Other | Admitting: Family Medicine

## 2017-04-25 ENCOUNTER — Ambulatory Visit: Payer: Medicare Other | Admitting: Cardiovascular Disease

## 2017-04-25 ENCOUNTER — Encounter: Payer: Self-pay | Admitting: Cardiovascular Disease

## 2017-04-25 VITALS — BP 126/74 | HR 78 | Ht 65.0 in | Wt 231.0 lb

## 2017-04-25 DIAGNOSIS — I519 Heart disease, unspecified: Secondary | ICD-10-CM | POA: Diagnosis not present

## 2017-04-25 DIAGNOSIS — I480 Paroxysmal atrial fibrillation: Secondary | ICD-10-CM | POA: Diagnosis not present

## 2017-04-25 DIAGNOSIS — I5189 Other ill-defined heart diseases: Secondary | ICD-10-CM

## 2017-04-25 DIAGNOSIS — I10 Essential (primary) hypertension: Secondary | ICD-10-CM | POA: Diagnosis not present

## 2017-04-25 NOTE — Patient Instructions (Addendum)
Medication Instructions:  Your physician recommends that you continue on your current medications as directed. Please refer to the Current Medication list given to you today.  Labwork: NONE  Testing/Procedures: NONE  Follow-Up: Your physician wants you to follow-up in: 6 months with PA or NP.   Your physician wants you to follow-up in: 1 year with Dr. Johnsie Cancel. You will receive a reminder letter in the mail two months in advance. If you don't receive a letter, please call our office to schedule the follow-up appointment.   If you need a refill on your cardiac medications before your next appointment, please call your pharmacy.

## 2017-05-20 ENCOUNTER — Other Ambulatory Visit: Payer: Self-pay | Admitting: Family Medicine

## 2017-05-21 ENCOUNTER — Other Ambulatory Visit: Payer: Self-pay | Admitting: *Deleted

## 2017-05-21 MED ORDER — MECLIZINE HCL 25 MG PO TABS: 25.0000 mg | ORAL_TABLET | ORAL | 3 refills | Status: DC | PRN

## 2017-05-27 ENCOUNTER — Ambulatory Visit: Payer: Medicare Other | Admitting: Gastroenterology

## 2017-05-27 ENCOUNTER — Encounter: Payer: Self-pay | Admitting: Gastroenterology

## 2017-05-27 VITALS — BP 146/80 | HR 84 | Ht 64.5 in | Wt 235.4 lb

## 2017-05-27 DIAGNOSIS — K828 Other specified diseases of gallbladder: Secondary | ICD-10-CM

## 2017-05-27 DIAGNOSIS — K582 Mixed irritable bowel syndrome: Secondary | ICD-10-CM

## 2017-05-27 DIAGNOSIS — R1031 Right lower quadrant pain: Secondary | ICD-10-CM

## 2017-05-27 DIAGNOSIS — R140 Abdominal distension (gaseous): Secondary | ICD-10-CM | POA: Diagnosis not present

## 2017-05-27 DIAGNOSIS — R937 Abnormal findings on diagnostic imaging of other parts of musculoskeletal system: Secondary | ICD-10-CM | POA: Diagnosis not present

## 2017-05-27 NOTE — Progress Notes (Addendum)
Courtney Pena    [id number]    09/06/1940  Primary Care Physician:Hensel, Jamal Collin, MD  Referring Physician: [name], MD [place], [place] [address]  Chief complaint:  Nausea and abdominal pain  HPI: 77 year old very pleasant African-American female, was last seen by [name] March 20, 2017 is here for follow-up visit.  She continues to complain of persistent right-sided abdominal pain.  Denies any exacerbating or alleviating factors.  At times she feels that it is coming from deep inside radiating to her groin and thigh but other times it feels more like superficial burning of her skin.  Denies any skin rash or redness.  No change in intensity of pain with diet or activity.  She has alternating constipation and diarrhea with bowel movements varying from 2-3 times a day to 2-3 times a week.  She is currently drinking prune juice 2-3 times a week.  Not on any laxatives. EGD May 2017 was normal exam with no evidence of GAVE.  Colonoscopy April 2012 showed sigmoid diverticulosis and had removal of diminutive polyp but was not adenoma but benign colonic mucosa based on pathology report. She was recommended to undergo CT abdomen pelvis with contrast during last office visit which did not show any acute GI pathology or identified etiology to explain patient's abdominal pain.  HIDA scan showed decreased gallbladder EF 20% suggestive of biliary dyskinesia but no other abnormality.    Outpatient Encounter Medications as of 05/27/2017  Medication Sig  . amLODipine (NORVASC) 5 MG tablet Take 1 tablet (5 mg total) by mouth daily.  . Cholecalciferol (VITAMIN D3) 2000 units TABS Take 1 capsule by mouth daily.   . Coenzyme Q10 (COQ10 PO) Take 10 mEq daily by mouth.  . diltiazem (CARDIZEM CD) 360 MG 24 hr capsule TAKE 1 CAPSULE BY MOUTH  DAILY  . diltiazem (CARDIZEM) 30 MG tablet Take 1 tablet by mouth every 4 hours AS NEEDED for heart rate >100 as  long as blood pressure >100.  Marland Kitchen ezetimibe (ZETIA) 10 MG tablet Take 1 tablet (10 mg total) by mouth daily.  . ferrous sulfate (FERROUSUL) 325 (65 FE) MG tablet Take 1 tablet (325 mg total) by mouth daily with breakfast.  . glipiZIDE (GLUCOTROL XL) 10 MG 24 hr tablet Take 1 tablet (10 mg total) by mouth daily with breakfast.  . Insulin Pen Needle 32G X 4 MM MISC Use 1x a day  . Lancets (ONETOUCH ULTRASOFT) lancets Use to check blood sugar daily.  Marland Kitchen levothyroxine (SYNTHROID, LEVOTHROID) 112 MCG tablet TAKE 1 TABLET BY MOUTH  DAILY  . liraglutide (VICTOZA) 18 MG/3ML SOPN Inject 0.3 mLs (1.8 mg total) into the skin daily.  . meclizine (ANTIVERT) 25 MG tablet Take 1 tablet (25 mg total) by mouth as needed.  . metFORMIN (GLUCOPHAGE-XR) 500 MG 24 hr tablet Take 4 tablets (2,000 mg total) by mouth daily with supper.  . metoprolol succinate (TOPROL-XL) 100 MG 24 hr tablet Take 1 tablet (100 mg total) 2 (two) times daily by mouth. Take with or immediately following a meal.  . Multiple Vitamin (MULTI-VITAMINS) TABS Take daily by mouth.  Marland Kitchen omeprazole (PRILOSEC) 40 MG capsule Take 1 capsule (40 mg total) by mouth daily.  . ONE TOUCH ULTRA TEST test strip TEST 3 TIMES DAILY  . rivaroxaban (XARELTO) 20 MG TABS tablet Take 1 tablet (20 mg total) by mouth daily with supper.  . triamcinolone cream (KENALOG) 0.1 % Apply  1 application topically 2 (two) times daily.  . nitroGLYCERIN (NITROSTAT) 0.4 MG SL tablet Place 1 tablet (0.4 mg total) under the tongue every 5 (five) minutes as needed for chest pain. (Patient not taking: Reported on 05/27/2017)   No facility-administered encounter medications on file as of 05/27/2017.     Allergies as of 05/27/2017 - Review Complete 05/27/2017  Allergen Reaction Noted  . Lisinopril Swelling 01/07/2006  . Nsaids Other (See Comments) 08/12/2011  . Penicillins Shortness Of Breath and Swelling 12/26/2010  . Sulfamethoxazole Hives and Itching 01/07/2006  . Actos [pioglitazone]  Swelling 01/07/2006  . Flecainide Other (See Comments) 10/13/2015  . Gabapentin Other (See Comments) 06/23/2015  . Iodinated diagnostic agents Itching 12/29/2015  . Statins Other (See Comments) 01/31/2017  . Losartan potassium Other (See Comments) 10/13/2015    Past Medical History:  Diagnosis Date  . Allergy   . Anemia due to GI blood loss 08/12/2011  . Arthritis   . Asthma   . Atrial flutter ([place]) 12-07-2010   converted in ED with 300 mg flecainide  . CAD (coronary artery disease)    a. mild per cath in 2004;  b. nonischemic Myoview in March 2012;  c. Lex MV 1/14:  EF 66%, no ischemia  . Chronic anticoagulation - Xarelto started 07/07/2015, CHADS2CVASC=5 07/07/2015  . External hemorrhoids 06/07/2010  . Gastric antral vascular ectasia    source for gi bleed in 07/2011 - Xarelto stopped  . Gastritis   . GERD (gastroesophageal reflux disease)   . Hiatal hernia   . Hyperlipidemia   . Hypertension   . Hypothyroidism   . Neuromuscular disorder ([place])   . Obesity   . Paroxysmal atrial fibrillation ([facility])   . Personal history of colonic polyps 06/06/2009   cecal polyp  . Sleep apnea    does not use CPAP  . Type 2 diabetes mellitus with diabetic chronic kidney disease ([place]) 04/25/2006      . Ulcer     Past Surgical History:  Procedure Laterality Date  . BREAST BIOPSY Left   . BREAST EXCISIONAL BIOPSY Left    benign  . CARDIAC CATHETERIZATION  1999&2004  . CARPAL TUNNEL RELEASE Bilateral 2003  . CATARACT EXTRACTION Bilateral   . COLONOSCOPY    . ESOPHAGOGASTRODUODENOSCOPY  08/13/2011   Procedure: ESOPHAGOGASTRODUODENOSCOPY (EGD);  Surgeon: Gatha Mayer, MD;  Location: [facility] ENDOSCOPY;  Service: Endoscopy;  Laterality: N/A;  . POLYPECTOMY      Family History  Problem Relation Age of Onset  . Heart disease Father   . Diabetes Maternal Grandfather   . Hypertension Mother   . Other Mother        brain tumor-benign  . Diabetes Daughter        pre-diabeties  . Colon polyps  Daughter   . Thyroid disease Daughter        x 2  . Colon cancer Neg Hx     Social History   Socioeconomic History  . Marital status: Widowed    Spouse name: Iona Beard  . Number of children: 4  . Years of education: some colle  . Highest education level: Not on file  Occupational History  . Occupation: Games developer: UNEMPLOYED  Social Needs  . Financial resource strain: Not on file  . Food insecurity:    Worry: Not on file    Inability: Not on file  . Transportation needs:    Medical: Not on file    Non-medical: Not on file  Tobacco  Use  . Smoking status: Former Smoker    Packs/day: 2.00    Years: 30.00    Pack years: 60.00    Types: Cigarettes    Last attempt to quit: 02/26/1993    Years since quitting: 24.2  . Smokeless tobacco: Never Used  Substance and Sexual Activity  . Alcohol use: No    Alcohol/week: 0.0 oz  . Drug use: No  . Sexual activity: Not Currently  Lifestyle  . Physical activity:    Days per week: Not on file    Minutes per session: Not on file  . Stress: Not on file  Relationships  . Social connections:    Talks on phone: Not on file    Gets together: Not on file    Attends religious service: Not on file    Active member of club or organization: Not on file    Attends meetings of clubs or organizations: Not on file    Relationship status: Not on file  . Intimate partner violence:    Fear of current or ex partner: Not on file    Emotionally abused: Not on file    Physically abused: Not on file    Forced sexual activity: Not on file  Other Topics Concern  . Not on file  Social History Narrative  . Not on file      Review of systems: Review of Systems  Constitutional: Negative for fever and chills.  HENT: Positive for ringing in ears Eyes: Negative for blurred vision.  Respiratory: Negative for cough, shortness of breath and wheezing.   Cardiovascular: Negative for chest pain and palpitations.  Gastrointestinal: as per  HPI Genitourinary: Negative for dysuria, urgency, frequency and hematuria.  Musculoskeletal: Negative for myalgias, back pain and positive for joint pain.  Skin: Negative for rash. ?itching secondary to pork Neurological: Negative for dizziness, tremors, focal weakness, seizures and loss of consciousness.  Endo/Heme/Allergies: Positive for seasonal allergies.  Psychiatric/Behavioral: Negative for depression, suicidal ideas and hallucinations.  All other systems reviewed and are negative.   Physical Exam: Vitals:   05/27/17 0854  BP: (!) 146/80  Pulse: 84   Body mass index is 39.78 kg/m. Gen:      No acute distress HEENT:  EOMI, sclera anicteric Neck:     No masses; no thyromegaly Lungs:    Clear to auscultation bilaterally; normal respiratory effort CV:         Regular rate and rhythm; no murmurs Abd:      + bowel sounds; soft, large pannus ,non-tender; no palpable masses, no distension Ext:    No edema; adequate peripheral perfusion Skin:      Warm and dry; no rash Neuro: alert and oriented x 3 Psych: normal mood and affect  Data Reviewed:  Reviewed labs, radiology imaging, old records and pertinent past GI work up  Colonoscopy April 2012:  Sigmoid diverticulosis and diminutive polyp removed that was normal colonic mucosa.  No adenomatous polyps.  CT abdomen pelvis with contrast March 22, 2017 Lower chest: Cardiomegaly and mild bibasilar scarring again noted.  Hepatobiliary: The liver and gallbladder are unremarkable. No biliary dilatation.  Pancreas: Unremarkable  Spleen: Unremarkable  Adrenals/Urinary Tract: The kidneys, adrenal glands and bladder are unremarkable except for is stable probable tiny bilateral renal cysts.  Stomach/Bowel: Stomach is within normal limits. Appendix appears normal. No evidence of bowel wall thickening, distention, or inflammatory changes. Colonic diverticulosis noted without evidence of diverticulitis.  Vascular/Lymphatic:  Aortic atherosclerosis. No enlarged abdominal or pelvic lymph  nodes.  Reproductive: Uterus and bilateral adnexa are unremarkable.  Other: No ascites, abscess or pneumoperitoneum.  Musculoskeletal: No acute abnormalities are identified. Degenerative changes in the lumbar spine are again noted with severe degenerative disc disease and endplate changes at Z6-1 again noted.  HIDA scan April 10, 2017: The computer generated ejection fraction of radiotracer from the gallbladder is diminished at 20%, normal greater than 33% using the oral agent.  IMPRESSION: Abnormally low ejection fraction of radiotracer from the gallbladder, a finding felt to be indicative of biliary dyskinesia. Cystic and common bile ducts are patent as is evidenced by visualization of gallbladder and small bowel.  Assessment and Plan/Recommendations:  77 year old female with history of hypertension, diabetes, hyperlipidemia, obstructive sleep apnea, morbid obesity, A. fib on chronic anticoagulation (Xarelto) here for follow-up of chronic right-lower abdominal pain  CT abdomen pelvis with contrast did not show any acute abnormality to determine the etiology for abdominal pain, has severe lumbar spine degenerative changes and disc disease at L2-3 level. Patient's abdominal pain appears to be more musculoskeletal or possibly secondary to radiculopathy with spine disease. Advised her to contact PMD to further evaluate spine disease.  She is not due for screening colonoscopy until 2022.  Repeat colonoscopy will likely be low yield for evaluation of chronic abd pain, given lack of significant findings on CT. Patient is in agreement.  Though HIDA scan findings are suggestive of biliary dyskinesia patient does not have any symptoms of gallbladder disease, her pain is mostly localized to the right lower quadrant radiating to the groin.  No LFT abnormality.  No epigastric or right upper quadrant abdominal pain.  Given her  age and co-morbidities, she is at high risk for potential complications associated with cholecystectomy.  Continue to monitor. Patient agrees with conservative management.  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 She had multiple questions which were answered to her satisfaction  K. Denzil Magnuson , MD [phone]   CC: [name], MD

## 2017-05-27 NOTE — Patient Instructions (Addendum)
If you are age 77 or older, your body mass index should be between 23-30. Your Body mass index is 39.78 kg/m. If this is out of the aforementioned range listed, please consider follow up with your Primary Care Provider.  If you are age 26 or younger, your body mass index should be between 19-25. Your Body mass index is 39.78 kg/m. If this is out of the aformentioned range listed, please consider follow up with your Primary Care Provider.   Please purchase the following medications over the counter and take as directed: Align capsule 1 capsule daily Prune juice daily IB Gard 1 capsule three times daily as needed.  Benefiber 1 tablespoon twice daily.   Thank you for choosing  GI  Dr Silverio Decamp

## 2017-07-01 ENCOUNTER — Other Ambulatory Visit (HOSPITAL_COMMUNITY): Payer: Self-pay | Admitting: Nurse Practitioner

## 2017-07-01 ENCOUNTER — Other Ambulatory Visit: Payer: Self-pay | Admitting: *Deleted

## 2017-07-01 ENCOUNTER — Other Ambulatory Visit: Payer: Self-pay | Admitting: Family Medicine

## 2017-07-01 ENCOUNTER — Other Ambulatory Visit: Payer: Self-pay | Admitting: Internal Medicine

## 2017-07-01 DIAGNOSIS — I2510 Atherosclerotic heart disease of native coronary artery without angina pectoris: Secondary | ICD-10-CM

## 2017-07-01 DIAGNOSIS — I10 Essential (primary) hypertension: Secondary | ICD-10-CM

## 2017-07-01 DIAGNOSIS — E1122 Type 2 diabetes mellitus with diabetic chronic kidney disease: Secondary | ICD-10-CM

## 2017-07-01 DIAGNOSIS — I480 Paroxysmal atrial fibrillation: Secondary | ICD-10-CM

## 2017-07-02 ENCOUNTER — Telehealth: Payer: Self-pay

## 2017-07-02 DIAGNOSIS — I2510 Atherosclerotic heart disease of native coronary artery without angina pectoris: Secondary | ICD-10-CM

## 2017-07-02 MED ORDER — NITROGLYCERIN 0.4 MG SL SUBL: 0.4000 mg | SUBLINGUAL_TABLET | SUBLINGUAL | 3 refills | Status: DC | PRN

## 2017-07-02 MED ORDER — METOPROLOL SUCCINATE ER 100 MG PO TB24: 100.0000 mg | ORAL_TABLET | Freq: Two times a day (BID) | ORAL | 3 refills | Status: DC

## 2017-07-02 NOTE — Telephone Encounter (Signed)
[facility] calling regarding patients nitrostat .4mg  rx. Needing new rx sent with updated quantity. Only comes in a quantity of 25 or 100. Also needs instructions to include the max dose patient can take in 1 day. Call back [phone] Can escribe new rx with changes Wallace Cullens, RN

## 2017-07-02 NOTE — Telephone Encounter (Signed)
New Rx sent.

## 2017-07-04 ENCOUNTER — Ambulatory Visit: Payer: Medicare Other | Admitting: Family Medicine

## 2017-07-08 ENCOUNTER — Other Ambulatory Visit: Payer: Self-pay | Admitting: Family Medicine

## 2017-07-08 DIAGNOSIS — R42 Dizziness and giddiness: Secondary | ICD-10-CM

## 2017-07-08 MED ORDER — MECLIZINE HCL 25 MG PO TABS: 25.0000 mg | ORAL_TABLET | ORAL | 3 refills | Status: DC | PRN

## 2017-07-18 ENCOUNTER — Ambulatory Visit: Payer: Medicare Other | Admitting: Family Medicine

## 2017-07-18 ENCOUNTER — Encounter: Payer: Self-pay | Admitting: Family Medicine

## 2017-07-18 ENCOUNTER — Other Ambulatory Visit: Payer: Self-pay

## 2017-07-18 VITALS — BP 132/76 | HR 80 | Temp 98.3°F | Ht 65.0 in | Wt 235.2 lb

## 2017-07-18 DIAGNOSIS — E1122 Type 2 diabetes mellitus with diabetic chronic kidney disease: Secondary | ICD-10-CM

## 2017-07-18 DIAGNOSIS — E1142 Type 2 diabetes mellitus with diabetic polyneuropathy: Secondary | ICD-10-CM

## 2017-07-18 DIAGNOSIS — I10 Essential (primary) hypertension: Secondary | ICD-10-CM

## 2017-07-18 LAB — POCT GLYCOSYLATED HEMOGLOBIN (HGB A1C): Hemoglobin A1C: 10.7 % — AB (ref 4.0–5.6)

## 2017-07-18 MED ORDER — DULOXETINE HCL 30 MG PO CPEP
30.0000 mg | ORAL_CAPSULE | Freq: Every day | ORAL | 3 refills | Status: DC
Start: 2017-07-18 — End: 2017-08-16

## 2017-07-18 NOTE — Patient Instructions (Addendum)
Eye exam! Make sure the doctor sends me a copy of the report. Get serious about the diabetes.  You are aging in dog years. I hope the duloxetine/cymbalta helps the neuropathy.  There is a higher dose if you get partial relief and no side effects. See me in three months.  We need to get the A1C down.

## 2017-07-18 NOTE — Progress Notes (Signed)
Asked by Dr. Andria Frames to support patient with supply of both Victoza and Xarelto due to financial progression into the donut hole.   Re- discussed progression through the donut hole with other medications and our willingness to support her over the next several months as needed until she passes through the donut hole into the lower copay cost.    Medication Samples have been provided to the patient.  Drug name: Victoza       Strength: 6mg /ml        Qty: 3  LOT: [id number]  Exp.Date: 03/28/2018  Dosing instructions: 1.8mg  daily  The patient has been instructed regarding the correct time, dose, and frequency of taking this medication, including desired effects and most common side effects.     Medication Samples have been provided to the patient.  Drug name: Xarelto (rivaroxaban)       Strength: 20mg         Qty: 42 tablets LOT: [id number] Exp.Date: 06/26/2019  Dosing instructions: one daily  The patient has been instructed regarding the correct time, dose, and frequency of taking this medication, including desired effects and most common side effects.   Janeann Forehand

## 2017-07-19 ENCOUNTER — Encounter: Payer: Self-pay | Admitting: Family Medicine

## 2017-07-19 NOTE — Assessment & Plan Note (Signed)
Poor control - It would be remarkable if she could achieve a 3.7 drop in A1C just by lifestyle, but she wants to try.

## 2017-07-19 NOTE — Progress Notes (Signed)
   Subjective:    Patient ID: Courtney Pena, female    DOB: 26-Oct-1940, 77 y.o.   MRN: [id number]  HPI Multiple issues: 1. DM.  Due for A1C.  Apprehensive since she knows she has had serious dietary indiscretion.  A1C=10.7.  "I know that I can get it down." 2. Neuropathy both feet. Bad enough that she wants to try symptomatic relief.  Has used gabapentin in the past and could not tolerate due to CNS side effects. 3. Strange, bilateral belt like tingling at level of umbilicus.  "I know it is in the skin, not deep."  No bowel or bladder changes.  Bad high lumbar spinal arthritis seen on recent CT abd.   4. HBP, no complaints.  Tolerating meds 5. Back in donut hole.  Wants to speak with clinical pharmacologist about eliquis and trulicity.    Review of Systems     Objective:   Physical Exam VS and wt noted.  Lungs clear Cardiac RRR without m or g abd benign Ext.  Good foot pulses, no callous or other skin change.  Diminished sensation.        Assessment & Plan:

## 2017-07-19 NOTE — Assessment & Plan Note (Signed)
Good control on current meds. 

## 2017-07-25 ENCOUNTER — Other Ambulatory Visit: Payer: Self-pay | Admitting: Cardiovascular Disease

## 2017-07-25 NOTE — Telephone Encounter (Signed)
Xarelto 20mg  refill request received; pt is 77 yrs old, wt-106.7kg, Crea-0.93 on 03/20/17, last seen by Dr. Johnsie Cancel on 04/25/17, CrCl-85.38ml/min; will send in refill to requested Pharmacy. Refill was sent on 07/01/17, therefore, Spoke with [facility] and she stated the refill was processed on 07/01/17 and she stated the refill was sent to [name] in error. Will deny this refill at this time as it is being processed by them and to date they cannot send out refill as it is too early and they will notify pt.

## 2017-07-28 ENCOUNTER — Other Ambulatory Visit: Payer: Self-pay | Admitting: Cardiovascular Disease

## 2017-07-28 ENCOUNTER — Other Ambulatory Visit: Payer: Self-pay

## 2017-07-28 ENCOUNTER — Emergency Department (HOSPITAL_COMMUNITY): Payer: Medicare Other

## 2017-07-28 ENCOUNTER — Emergency Department (HOSPITAL_COMMUNITY)
Admission: EM | Admit: 2017-07-28 | Discharge: 2017-07-28 | Disposition: A | Discharge status: Home / Self Care | Payer: Medicare Other | Attending: Emergency Medicine | Admitting: Emergency Medicine

## 2017-07-28 ENCOUNTER — Encounter (HOSPITAL_COMMUNITY): Payer: Self-pay

## 2017-07-28 DIAGNOSIS — Z79899 Other long term (current) drug therapy: Secondary | ICD-10-CM | POA: Insufficient documentation

## 2017-07-28 DIAGNOSIS — I2510 Atherosclerotic heart disease of native coronary artery without angina pectoris: Secondary | ICD-10-CM | POA: Insufficient documentation

## 2017-07-28 DIAGNOSIS — R600 Localized edema: Secondary | ICD-10-CM

## 2017-07-28 DIAGNOSIS — Z87891 Personal history of nicotine dependence: Secondary | ICD-10-CM | POA: Diagnosis not present

## 2017-07-28 DIAGNOSIS — E039 Hypothyroidism, unspecified: Secondary | ICD-10-CM | POA: Insufficient documentation

## 2017-07-28 DIAGNOSIS — I129 Hypertensive chronic kidney disease with stage 1 through stage 4 chronic kidney disease, or unspecified chronic kidney disease: Secondary | ICD-10-CM | POA: Diagnosis not present

## 2017-07-28 DIAGNOSIS — N189 Chronic kidney disease, unspecified: Secondary | ICD-10-CM | POA: Insufficient documentation

## 2017-07-28 DIAGNOSIS — E1122 Type 2 diabetes mellitus with diabetic chronic kidney disease: Secondary | ICD-10-CM | POA: Insufficient documentation

## 2017-07-28 DIAGNOSIS — R0602 Shortness of breath: Secondary | ICD-10-CM | POA: Diagnosis present

## 2017-07-28 DIAGNOSIS — J40 Bronchitis, not specified as acute or chronic: Secondary | ICD-10-CM | POA: Diagnosis not present

## 2017-07-28 DIAGNOSIS — E1140 Type 2 diabetes mellitus with diabetic neuropathy, unspecified: Secondary | ICD-10-CM | POA: Insufficient documentation

## 2017-07-28 LAB — CBC
HCT: 32.4 % — ABNORMAL LOW (ref 36.0–46.0)
Hemoglobin: 9.7 g/dL — ABNORMAL LOW (ref 12.0–15.0)
MCH: 25 pg — ABNORMAL LOW (ref 26.0–34.0)
MCHC: 29.9 g/dL — ABNORMAL LOW (ref 30.0–36.0)
MCV: 83.5 fL (ref 78.0–100.0)
Platelets: 315 10*3/uL (ref 150–400)
RBC: 3.88 MIL/uL (ref 3.87–5.11)
RDW: 16.8 % — ABNORMAL HIGH (ref 11.5–15.5)
WBC: 7.1 10*3/uL (ref 4.0–10.5)

## 2017-07-28 LAB — BASIC METABOLIC PANEL
Anion gap: 9 (ref 5–15)
BUN: 21 mg/dL — ABNORMAL HIGH (ref 6–20)
CO2: 24 mmol/L (ref 22–32)
Calcium: 9.5 mg/dL (ref 8.9–10.3)
Chloride: 103 mmol/L (ref 101–111)
Creatinine, Ser: 0.92 mg/dL (ref 0.44–1.00)
GFR calc Af Amer: >60 mL/min (ref >60)
GFR calc non Af Amer: 59 mL/min — ABNORMAL LOW (ref >60)
Glucose, Bld: 218 mg/dL — ABNORMAL HIGH (ref 65–99)
Potassium: 4.4 mmol/L (ref 3.5–5.1)
Sodium: 136 mmol/L (ref 135–145)

## 2017-07-28 LAB — I-STAT TROPONIN, ED: Troponin i, poc: 0 ng/mL (ref 0.00–0.08)

## 2017-07-28 LAB — BRAIN NATRIURETIC PEPTIDE: B Natriuretic Peptide: 31 pg/mL (ref 0.0–100.0)

## 2017-07-28 MED ORDER — BENZONATATE 100 MG PO CAPS
200.0000 mg | ORAL_CAPSULE | Freq: Once | ORAL | Status: AC
  Administered 2017-07-28: 200 mg via ORAL
  Filled 2017-07-28: qty 2

## 2017-07-28 MED ORDER — AEROCHAMBER PLUS FLO-VU MEDIUM MISC
1.0000 | Freq: Once | Status: AC
  Administered 2017-07-28: 1
  Filled 2017-07-28: qty 1

## 2017-07-28 MED ORDER — BENZONATATE 100 MG PO CAPS: 200.0000 mg | ORAL_CAPSULE | Freq: Three times a day (TID) | ORAL | 0 refills | Status: DC

## 2017-07-28 MED ORDER — ALBUTEROL SULFATE HFA 108 (90 BASE) MCG/ACT IN AERS
1.0000 | INHALATION_SPRAY | Freq: Once | RESPIRATORY_TRACT | Status: AC
  Administered 2017-07-28: 1 via RESPIRATORY_TRACT
  Filled 2017-07-28: qty 6.7

## 2017-07-28 NOTE — ED Provider Notes (Signed)
[facility] EMERGENCY DEPARTMENT Provider Note   CSN: [id number] Arrival date & time: 07/28/17  1151     History   Chief Complaint Chief Complaint  Patient presents with  . Leg Swelling  . Shortness of Breath    HPI Courtney Pena is a 77 y.o. female with a past medical history of CAD, atrial fibrillation currently on Xarelto, who presents to ED for evaluation of 1 week history of bilateral leg swelling, cough productive with green mucus, shortness of breath.  She states that the shortness of breath is intermittent and worse when she lays flat.  She reports worsening of her symptoms 4 days ago.  She took an antitussive that was prescribed by her PCP with no improvement in her symptoms.  States that it actually made her symptoms worse.  She has noticed the leg swelling as well which she denies a history of.  She is compliant with her home ABDs medications, Xarelto, metoprolol.  She denies any chest pain, abdominal pain, fever, hemoptysis.  No sick contacts with similar symptoms.  HPI  Past Medical History:  Diagnosis Date  . Allergy   . Anemia due to GI blood loss 08/12/2011  . Arthritis   . Asthma   . Atrial flutter ([place]) 12-07-2010   converted in ED with 300 mg flecainide  . CAD (coronary artery disease)    a. mild per cath in 2004;  b. nonischemic Myoview in March 2012;  c. Lex MV 1/14:  EF 66%, no ischemia  . Chronic anticoagulation - Xarelto started 07/07/2015, CHADS2CVASC=5 07/07/2015  . External hemorrhoids 06/07/2010  . Gastric antral vascular ectasia    source for gi bleed in 07/2011 - Xarelto stopped  . Gastritis   . GERD (gastroesophageal reflux disease)   . Hiatal hernia   . Hyperlipidemia   . Hypertension   . Hypothyroidism   . Neuromuscular disorder ([place])   . Obesity   . Paroxysmal atrial fibrillation ([facility])   . Personal history of colonic polyps 06/06/2009   cecal polyp  . Sleep apnea    does not use CPAP  . Type 2 diabetes mellitus  with diabetic chronic kidney disease ([place]) 04/25/2006      . Ulcer     Patient Active Problem List   Diagnosis Date Noted  . Right flank pain 03/13/2017  . Diabetic neuropathy ([place]) 10/17/2016  . Transaminitis 11/10/2015  . Chronic anticoagulation - Xarelto started 07/07/2015, CHADS2CVASC=5 07/07/2015  . Grief 06/24/2015  . Left sided chest pain 05/12/2015  . Subcutaneous cyst 07/09/2014  . Fatigue 06/24/2014  . Chronic gout of multiple sites 03/23/2014  . Degenerative arthritis of thumb 02/12/2014  . Unspecified vitamin D deficiency 09/18/2013  . Hyperlipidemia associated with type 2 diabetes mellitus ([place]) 08/22/2012  . Nummular eczema 10/10/2011  . 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 Anemia due to GI blood loss 08/12/2011  . Pulmonary nodule 12/26/2010  . Paroxysmal atrial fibrillation ([place]) 12/14/2010  . Hypothyroidism 04/25/2006  . Type 2 diabetes mellitus with diabetic chronic kidney disease ([place]) 04/25/2006  . HYPERCHOLESTEROLEMIA 04/25/2006  . Morbid obesity ([place]) 04/25/2006  . HYPERTENSION, BENIGN SYSTEMIC 04/25/2006  . Coronary atherosclerosis 04/25/2006  . Reflux esophagitis 04/25/2006  . DIVERTICULOSIS OF COLON 04/25/2006  . DJD, UNSPECIFIED 04/25/2006  . VERTIGO NOS OR DIZZINESS 04/25/2006  . Sleep apnea 04/25/2006    Past Surgical History:  Procedure Laterality Date  . BREAST BIOPSY Left   . BREAST EXCISIONAL BIOPSY Left    benign  .  CARDIAC CATHETERIZATION  1999&2004  . CARPAL TUNNEL RELEASE Bilateral 2003  . CATARACT EXTRACTION Bilateral   . COLONOSCOPY    . ESOPHAGOGASTRODUODENOSCOPY  08/13/2011   Procedure: ESOPHAGOGASTRODUODENOSCOPY (EGD);  Surgeon: Gatha Mayer, MD;  Location: [facility] ENDOSCOPY;  Service: Endoscopy;  Laterality: N/A;  . POLYPECTOMY       OB History   None      Home Medications    Prior to Admission medications   Medication Sig Start Date End Date Taking? Authorizing Provider  amLODipine (NORVASC) 5 MG tablet Take 1  tablet (5 mg total) by mouth daily. 03/28/17  Yes Hensel, Jamal Collin, MD  Cholecalciferol (VITAMIN D3) 2000 units TABS Take 1 capsule by mouth daily.    Yes [provider]  Coenzyme Q10 (COQ10 PO) Take 10 mEq daily by mouth. 06/28/05  Yes [provider]  diltiazem (CARDIZEM CD) 360 MG 24 hr capsule TAKE 1 CAPSULE BY MOUTH  DAILY 05/20/17  Yes Hensel, Jamal Collin, MD  diltiazem (CARDIZEM) 30 MG tablet Take 1 tablet by mouth every 4 hours AS NEEDED for heart rate >100 as long as blood pressure >100.   Yes [provider]  ferrous sulfate (FERROUSUL) 325 (65 FE) MG tablet Take 1 tablet (325 mg total) by mouth daily with breakfast. 01/25/17  Yes Hensel, Jamal Collin, MD  glipiZIDE (GLUCOTROL XL) 10 MG 24 hr tablet Take 1 tablet (10 mg total) by mouth daily with breakfast. 09/28/16  Yes Hensel, Jamal Collin, MD  levothyroxine (SYNTHROID, LEVOTHROID) 112 MCG tablet TAKE 1 TABLET BY MOUTH  DAILY 11/12/16  Yes Hensel, Jamal Collin, MD  liraglutide (VICTOZA) 18 MG/3ML SOPN Inject 0.3 mLs (1.8 mg total) into the skin daily. 02/13/17  Yes Hensel, Jamal Collin, MD  metFORMIN (GLUCOPHAGE-XR) 500 MG 24 hr tablet Take 4 tablets (2,000 mg total) by mouth daily with supper. 10/03/16  Yes Philemon Kingdom, MD  metoprolol succinate (TOPROL-XL) 100 MG 24 hr tablet Take 1 tablet (100 mg total) by mouth 2 (two) times daily. Take with or immediately following a meal. 07/02/17  Yes Hensel, Jamal Collin, MD  Multiple Vitamin (MULTI-VITAMINS) TABS Take daily by mouth. 06/28/05  Yes [provider]  nitroGLYCERIN (NITROSTAT) 0.4 MG SL tablet Place 1 tablet (0.4 mg total) under the tongue every 5 (five) minutes as needed for chest pain (Call 911 if chest pain after three doses). 07/02/17  Yes Hensel, Jamal Collin, MD  omeprazole (PRILOSEC) 40 MG capsule Take 1 capsule (40 mg total) by mouth daily. 01/24/17  Yes Hensel, Jamal Collin, MD  XARELTO 20 MG TABS tablet TAKE 1 TABLET BY MOUTH  DAILY WITH SUPPER 07/01/17  Yes Josue Hector, MD  benzonatate (TESSALON) 100 MG capsule Take 2 capsules (200 mg total) by mouth every 8 (eight) hours. 07/28/17   Breona Cherubin, PA-C  DULoxetine (CYMBALTA) 30 MG capsule Take 1 capsule (30 mg total) by mouth daily. Patient not taking: Reported on 07/28/2017 07/18/17   [name], MD  ezetimibe (ZETIA) 10 MG tablet Take 1 tablet (10 mg total) by mouth daily. Patient not taking: Reported on 07/28/2017 03/05/17   [name], MD  Insulin Pen Needle (BD PEN NEEDLE NANO U/F) 32G X 4 MM MISC USE ONCE A DAY 07/02/17   Philemon Kingdom, MD  Lancets [facility] ULTRASOFT) lancets Use to check blood sugar daily. 02/13/17   [name], MD  meclizine (ANTIVERT) 25 MG tablet Take 1 tablet (25 mg total) by mouth as needed. Patient not  taking: Reported on 07/28/2017 07/08/17   [name], MD  ONE TOUCH ULTRA TEST test strip TEST 3 TIMES DAILY 07/01/17   [name], MD  triamcinolone cream (KENALOG) 0.1 % Apply 1 application topically 2 (two) times daily. Patient not taking: Reported on 07/28/2017 01/24/17   [name], MD    Family History Family History  Problem Relation Age of Onset  . Heart disease Father   . Diabetes Maternal Grandfather   . Hypertension Mother   . Other Mother        brain tumor-benign  . Diabetes Daughter        pre-diabeties  . Colon polyps Daughter   . Thyroid disease Daughter        x 2  . Colon cancer Neg Hx     Social History Social History   Tobacco Use  . Smoking status: Former Smoker    Packs/day: 2.00    Years: 30.00    Pack years: 60.00    Types: Cigarettes    Last attempt to quit: 02/26/1993    Years since quitting: 24.4  . Smokeless tobacco: Never Used  Substance Use Topics  . Alcohol use: No    Alcohol/week: 0.0 oz  . Drug use: No     Allergies   Lisinopril; Nsaids; Penicillins; Sulfamethoxazole; Actos [pioglitazone]; Flecainide; Iodinated diagnostic agents; Statins; and Losartan potassium   Review of Systems Review  of Systems  Constitutional: Negative for appetite change, chills and fever.  HENT: Negative for ear pain, rhinorrhea, sneezing and sore throat.   Eyes: Negative for photophobia and visual disturbance.  Respiratory: Positive for cough and shortness of breath. Negative for chest tightness and wheezing.   Cardiovascular: Positive for leg swelling. Negative for chest pain and palpitations.  Gastrointestinal: Negative for abdominal pain, blood in stool, constipation, diarrhea, nausea and vomiting.  Genitourinary: Negative for dysuria, hematuria and urgency.  Musculoskeletal: Negative for myalgias.  Skin: Negative for rash.  Neurological: Negative for dizziness, weakness and light-headedness.     Physical Exam Updated Vital Signs BP 108/70   Pulse 70   Temp 97.9 F (36.6 C) (Oral)   Resp 20   Ht 5\' 5"  (1.651 m)   Wt 106.7 kg (235 lb 3.7 oz)   SpO2 91%   BMI 39.14 kg/m   Physical Exam  Constitutional: She appears well-developed and well-nourished. No distress.  HENT:  Head: Normocephalic and atraumatic.  Nose: Nose normal.  Eyes: Conjunctivae and EOM are normal. Left eye exhibits no discharge. No scleral icterus.  Neck: Normal range of motion. Neck supple.  Cardiovascular: Normal rate, regular rhythm, normal heart sounds and intact distal pulses. Exam reveals no gallop and no friction rub.  No murmur heard. Pulmonary/Chest: Effort normal. No respiratory distress. She has rales in the right lower field.  Abdominal: Soft. Bowel sounds are normal. She exhibits no distension. There is no tenderness. There is no guarding.  Musculoskeletal: Normal range of motion.       Right lower leg: She exhibits edema.       Left lower leg: She exhibits edema.  Bilateral trace pitting edema in lower extremities.  No calf tenderness bilaterally.  No erythema noted.  2+ DP pulse noted.  Neurological: She is alert. She exhibits normal muscle tone. Coordination normal.  Skin: Skin is warm and dry. No  rash noted.  Psychiatric: She has a normal mood and affect.  Nursing note and vitals reviewed.    ED Treatments / Results  Labs (  all labs ordered are listed, but only abnormal results are displayed) Labs Reviewed  BASIC METABOLIC PANEL - Abnormal; Notable for the following components:      Result Value   Glucose, Bld 218 (*)    BUN 21 (*)    GFR calc non Af Amer 59 (*)    All other components within normal limits  CBC - Abnormal; Notable for the following components:   Hemoglobin 9.7 (*)    HCT 32.4 (*)    MCH 25.0 (*)    MCHC 29.9 (*)    RDW 16.8 (*)    All other components within normal limits  BRAIN NATRIURETIC PEPTIDE  I-STAT TROPONIN, ED    EKG EKG Interpretation  Date/Time:  Sunday July 28 2017 12:01:04 EDT Ventricular Rate:  80 PR Interval:  228 QRS Duration: 66 QT Interval:  384 QTC Calculation: 442 R Axis:   44 Text Interpretation:  Sinus rhythm with 1st degree A-V block Low voltage QRS Cannot rule out Anteroseptal infarct , age undetermined Abnormal ECG No significant change since last tracing Confirmed by Orlie Dakin [phone]) on 07/28/2017 1:41:48 PM   Radiology Dg Chest 2 View  Result Date: 07/28/2017 CLINICAL DATA:  Shortness of breath, productive cough, bilateral lower extremity swelling. EXAM: CHEST - 2 VIEW COMPARISON:  Chest x-ray dated 03/14/2017. FINDINGS: Stable cardiomegaly. Atherosclerotic changes noted at the aortic arch. Lungs are clear. No pleural effusion or pneumothorax seen. No acute or suspicious osseous finding. Mild degenerative change again noted within the thoracic spine. IMPRESSION: 1. No acute findings.  No evidence of pneumonia or pulmonary edema. 2. Stable cardiomegaly. 3. Aortic atherosclerosis. Electronically Signed   By: Franki Cabot M.D.   On: 07/28/2017 12:38    Procedures Procedures (including critical care time)  Medications Ordered in ED Medications  albuterol (PROVENTIL HFA;VENTOLIN HFA) 108 (90 Base) MCG/ACT inhaler 1  puff (has no administration in time range)  AEROCHAMBER PLUS FLO-VU MEDIUM MISC 1 each (has no administration in time range)  benzonatate (TESSALON) capsule 200 mg (200 mg Oral Given 07/28/17 1351)     Initial Impression / Assessment and Plan / ED Course  I have reviewed the triage vital signs and the nursing notes.  Pertinent labs & imaging results that were available during my care of the patient were reviewed by me and considered in my medical decision making (see chart for details).  Clinical Course as of Jul 29 1522  Sun Jul 28, 2017  1345 SpO2: 96 % [HK]    Clinical Course User Index [HK] Delia Heady, PA-C    77 year old female with a past medical history of CAD, atrial fibrillation currently on Xarelto, presents to ED for evaluation of 1 week history of bilateral leg swelling, cough productive with green mucus and shortness of breath.  Reports shortness of breath with walking short distances but this is somewhat similar to her baseline. She was actually walking with improvement in her dyspnea and her home today.  On physical exam she is overall well-appearing.  She has lower coarse breath sounds in bilateral lung fields.  There is trace pitting edema in bilateral lower extremities.  No erythema or calf tenderness bilaterally.  CBC shows hemoglobin of 9.7 which is slightly lower than baseline.  She denies any frank bleeding.  BMP unremarkable.  Troponin negative x1.  BNP normal at 31.  Chest x-ray is unremarkable.  EKG with tracing similar to prior.  She is afebrile.  She is not tachycardic, tachypneic or hypoxic.  She  does have a cough noted on my examination.  Suspect that her symptoms are viral in nature.  Will treat for bronchitis and encourage elevation of her legs advised to follow-up with her primary care provider and her cardiologist for further evaluation and recheck of her lab work in 1 week.  Give prescription for Tessalon Perles and inhaler with spacer here.  Advised to return to  ED for any severe worsening symptoms. Patient discussed with and seen by my attending, Dr. Winfred Leeds.  Portions of this note were generated with [employer]. Dictation errors may occur despite best attempts at proofreading.   Final Clinical Impressions(s) / ED Diagnoses   Final diagnoses:  Leg edema  Bronchitis    ED Discharge Orders        Ordered    benzonatate (TESSALON) 100 MG capsule  Every 8 hours     07/28/17 1517       Delia Heady, PA-C 07/28/17 [place], MD 07/28/17 [phone]

## 2017-07-28 NOTE — ED Notes (Signed)
Pt and daughter states they understand instructions. Hopme stable with daughter.

## 2017-07-28 NOTE — ED Triage Notes (Signed)
Pt states that she has had increased leg swelling with trouble laying flat and that her "afib has been acting up" Pt also reports a cough with green mucus.

## 2017-07-28 NOTE — Discharge Instructions (Signed)
Take Tessalon Perles as needed for cough.  Use your inhaler as needed for your shortness of breath. Elevate your extremities as directed. Follow-up with your primary care provider and cardiologist for further evaluation. Return to ED for worsening symptoms, coughing up blood, severe chest pain, fevers.

## 2017-07-28 NOTE — ED Provider Notes (Signed)
Complains of cough productive of green sputum for the past one week, she states today she coughed up "thick green chunks" she also reports mild dyspnea for the past one week worse with exertion. No nausea or vomiting. No fever. Also reports ankle swelling for the past 4 or 5 days. No other associated symptoms no chest pain. On a normal day she can walk around her house but is unable to walk up steps due to dyspnea. Today she feels breathing is somewhat worse than her baseline however she is able to walk around her house today without difficulty. On exam she is in no distress, speaks in paragraphsHEENT exam no facial asymmetry. Neck supple no JVD lungs with scant dry crackles. No respiratory distress. Heart regular rate and rhythm abdomen obese nontender. Bilateral lower extremities with trace to 1+ pedal edema and ankle edema   Orlie Dakin, MD 07/28/17 1354

## 2017-07-29 NOTE — Telephone Encounter (Signed)
Xarelto 20mg  refill request received; pt is 77 yrs old, wt-106.7kg, Crea-0.93 on 03/20/17, last seen by Dr. Johnsie Cancel on 04/25/17, CrCl-85.12ml/min; will send in refill to requested Pharmacy. Last refill was sent to [facility], however, she needs this sent locally.

## 2017-08-08 ENCOUNTER — Ambulatory Visit: Payer: Medicare Other | Admitting: Gastroenterology

## 2017-08-15 ENCOUNTER — Encounter: Payer: Self-pay | Admitting: Family Medicine

## 2017-08-15 ENCOUNTER — Other Ambulatory Visit: Payer: Self-pay

## 2017-08-15 ENCOUNTER — Ambulatory Visit: Payer: Medicare Other | Admitting: Family Medicine

## 2017-08-15 DIAGNOSIS — R05 Cough: Secondary | ICD-10-CM

## 2017-08-15 DIAGNOSIS — M1A09X0 Idiopathic chronic gout, multiple sites, without tophus (tophi): Secondary | ICD-10-CM | POA: Diagnosis not present

## 2017-08-15 DIAGNOSIS — I5032 Chronic diastolic (congestive) heart failure: Secondary | ICD-10-CM

## 2017-08-15 DIAGNOSIS — R609 Edema, unspecified: Secondary | ICD-10-CM | POA: Diagnosis not present

## 2017-08-15 DIAGNOSIS — R059 Cough, unspecified: Secondary | ICD-10-CM

## 2017-08-15 MED ORDER — FUROSEMIDE 20 MG PO TABS: 20.0000 mg | ORAL_TABLET | Freq: Every day | ORAL | 3 refills | Status: DC

## 2017-08-15 MED ORDER — FLUTICASONE PROPIONATE 50 MCG/ACT NA SUSP: 2.0000 | Freq: Every day | NASAL | 6 refills | Status: DC

## 2017-08-15 MED ORDER — COLCHICINE 0.6 MG PO TABS: 0.6000 mg | ORAL_TABLET | Freq: Two times a day (BID) | ORAL | 2 refills | Status: DC | PRN

## 2017-08-15 NOTE — Patient Instructions (Addendum)
Please ask Dr. Andria Frames (eye) to send me a copy of her report. OK to use 1. Tussin DM for cough 2. Zyrtec for sinus/allergies 3. I will prescribe another inhalor 4. I will prescribe a small dose of fluid pill. 5. If the cough continues after another month, I will need to get you to Dr. Valentina Lucks to do lung tests for COPD 6. Flonase for the nasal congestion.

## 2017-08-15 NOTE — Assessment & Plan Note (Signed)
Unclear if asymptomatic peripheral edema or part of diastolic dysfunction CHF

## 2017-08-16 ENCOUNTER — Other Ambulatory Visit: Payer: Self-pay | Admitting: Family Medicine

## 2017-08-16 ENCOUNTER — Encounter: Payer: Self-pay | Admitting: Family Medicine

## 2017-08-16 DIAGNOSIS — E1142 Type 2 diabetes mellitus with diabetic polyneuropathy: Secondary | ICD-10-CM

## 2017-08-16 DIAGNOSIS — I5030 Unspecified diastolic (congestive) heart failure: Secondary | ICD-10-CM

## 2017-08-16 DIAGNOSIS — I5032 Chronic diastolic (congestive) heart failure: Secondary | ICD-10-CM | POA: Insufficient documentation

## 2017-08-16 HISTORY — DX: Unspecified diastolic (congestive) heart failure: I50.30

## 2017-08-16 NOTE — Assessment & Plan Note (Signed)
Given colchicine in case furosemide causes flair.

## 2017-08-16 NOTE — Assessment & Plan Note (Signed)
Unclear if symptomatic.  Add low dose lasix.

## 2017-08-16 NOTE — Assessment & Plan Note (Signed)
Unclear etiology.  Many have multiple contributing factors. 1. Likely still has an element of post viral cough. 2. Allergy/chronic sinusitis may play a role 3. Diastolic CHF may play a role - although BNP was reassuring 4. Previously undiagnosed COPD could be responsible  Rx with zyrtec and flonase. Add low dose furosemide.  Observe. If remains symptomatic, will need PFTs.

## 2017-08-16 NOTE — Progress Notes (Signed)
   Subjective:    Patient ID: Courtney Pena, female    DOB: April 27, 1940, 77 y.o.   MRN: [id number]  HPI  Patient had a lower resp track infection, presumed viral x 6 weeks.  No fever.  Just lingering cough.  Now not improving.  No DOE.  Does feel like much of her cough is post nasal drip induced.   Has remote smoking hx with total of 15 pack year hx.  Had been given an albuterol inhalor which helped symptomatically.   Also had grade one diastolic dysfunction based on echo from 18 months earlier.  Not previously dxed with CHF.  Note BNP was normal earlier in the course of this illness.  Does have more ankle swelling that usual.  Not currently on a diuretic - Had been on HCTZ which was stopped due to concerns for gout.   Review of Systems     Objective:   Physical Exam  Lung clear Cardiac RRR without m or g Ext 2 + bilateral ankle edema.        Assessment & Plan:

## 2017-08-19 ENCOUNTER — Other Ambulatory Visit: Payer: Self-pay | Admitting: Internal Medicine

## 2017-09-04 ENCOUNTER — Ambulatory Visit: Payer: Medicare Other | Admitting: Family Medicine

## 2017-09-11 ENCOUNTER — Ambulatory Visit: Payer: Medicare Other | Admitting: Family Medicine

## 2017-09-13 ENCOUNTER — Ambulatory Visit: Payer: Medicare Other | Admitting: Pharmacist

## 2017-09-19 ENCOUNTER — Ambulatory Visit: Payer: Medicare Other | Admitting: Pharmacist

## 2017-09-19 ENCOUNTER — Encounter: Payer: Self-pay | Admitting: Pharmacist

## 2017-09-19 DIAGNOSIS — E1122 Type 2 diabetes mellitus with diabetic chronic kidney disease: Secondary | ICD-10-CM

## 2017-09-19 DIAGNOSIS — R05 Cough: Secondary | ICD-10-CM | POA: Diagnosis not present

## 2017-09-19 DIAGNOSIS — R059 Cough, unspecified: Secondary | ICD-10-CM

## 2017-09-19 DIAGNOSIS — I2510 Atherosclerotic heart disease of native coronary artery without angina pectoris: Secondary | ICD-10-CM | POA: Diagnosis not present

## 2017-09-19 MED ORDER — NITROGLYCERIN 0.4 MG SL SUBL
0.4000 mg | SUBLINGUAL_TABLET | SUBLINGUAL | 3 refills | Status: DC | PRN
Start: 2017-09-19 — End: 2018-04-14

## 2017-09-19 MED ORDER — LIRAGLUTIDE 18 MG/3ML ~~LOC~~ SOPN: 1.8000 mg | PEN_INJECTOR | Freq: Every day | SUBCUTANEOUS | 0 refills | Status: DC

## 2017-09-19 MED ORDER — RIVAROXABAN 20 MG PO TABS: 20.0000 mg | ORAL_TABLET | Freq: Every day | ORAL | 0 refills | Status: DC

## 2017-09-19 MED ORDER — GLIPIZIDE ER 10 MG PO TB24: 10.0000 mg | ORAL_TABLET | Freq: Two times a day (BID) | ORAL | 2 refills | Status: DC

## 2017-09-19 NOTE — Patient Instructions (Addendum)
It was nice to see you today!   We did Pulmonary Function Tests (PFTs) to look at your lung function, and these were normal.   There are some options of over-the-counter products that you can try for the congestion:  1) An over the counter allergy pill, like Zyrtec (cetirizine), Allegra (fexofenidine), or Claritin (loratidine)  2) Saline (salt water) nasal spray   We gave you samples for Victoza (liraglutide) and Xarelto (rivaroxaban) today.   To help your blood sugars, we are going to make a change today:  1) Take glipizide XL 10 mg TWICE DAILY  2) Continue taking metformin in the evening and Victoza in the morning.   We sent in a refill for the nitroglycerin to [facility] in [place] today.    Follow up with Dr. Andria Frames in 1 month.

## 2017-09-19 NOTE — Assessment & Plan Note (Signed)
Cough Patient has been experiencing cough and congestion for ~8 weeks and taking Flonase (fluticasone) with some relief, but increased intraocular pressure. Spirometry evaluation reveals normal lung function. Congestion most likely related to post-viral infection symptoms.  - Patient to add Zyrtec (cetirizine) 10 mg daily  - Suggested saline nasal sprays instead of intranasal corticosteroids

## 2017-09-19 NOTE — Progress Notes (Signed)
S:    Patient arrives in good spirits. Presents for lung function evaluation and medication assistance. Patient was referred by Dr. Andria Frames (referred on 08/15/2017) when she was last seen.  Patient reports breathing and troublesome cough have gotten much better over the past month. She denies improvement in congestion/cough with benzonatate. She did endorse improvement from Flonase (fluticasone) but her eye doctor instructed her to discontinue d/t increased pressure in her eyes. She notes that she started smoking around age 80, and was smoking ~ 2 ppd and decreased to 1/2 ppd before stopping in 1995 (33 years).  She also notes concerns affording Victoza (liraglutide) and Xarelto (rivaroxaban).  She notes significant peripheral neuropathy. Upon medication review, she notes that she stopped duloxetine after 2 doses. She remembers that it made her feel dizzy. She was worried to drive on this medication. She tried gabapentin in the past and did not tolerate d/t grogginess. She stopped taking Zetia (ezetimibe) due to cramps, and would not like to take this medication. She was taking metoprolol succinate twice daily as prescribed, but had issues with hypotension and is now only taking this once daily.   She notes that she takes glipizide in the morning, metformin in the evening, and recently changed Victoza to morning administration instead of evening. She notes that this did not help improve SMBG results. She checks SMBG every morning.   O: Physical Exam  Constitutional: She appears well-developed and well-nourished.  Musculoskeletal: She exhibits edema (1+ bilaterally).   Review of Systems  Respiratory: Positive for cough (but per patient report improved).   All other systems reviewed and are negative.  Vitals:   09/19/17 0902  BP: 128/64  Pulse: 85  SpO2: 98%    See "scanned report" or Documentation Flowsheet (discrete results - PFTs) for Spirometry results. Patient provided good effort  while attempting spirometry.   Lung Age = 80  SMBG Results:  - FBG this morning 340; routinely seeing 200-300s; some 100s later on during the day; lowest 140  A/P: Cough Patient has been experiencing cough and congestion for ~8 weeks and taking Flonase (fluticasone) with some relief, but increased intraocular pressure. Spirometry evaluation reveals normal lung function. Congestion most likely related to post-viral infection symptoms.  - Patient to add Zyrtec (cetirizine) 10 mg daily  - Suggested saline nasal sprays instead of intranasal corticosteroids  Diabetes Longstanding uncontrolled diabetes; patient resistant to insulin initiation. Notes difficulties affording Victoza. Uncontrolled diabetes likely contributing to burden of neuropathy. Intolerant to gabapentin and duloxetine. As patient notes significantly elevated SMBG in the morning that trends to 100-200s in the afternoon, glipizide may be wearing off.  - Increased glipizide XL 10 mg from daily to twice daily - Provided samples of Victoza 1.8 mg daily  - Discussed Medicare Extra Help/LIS and Victoza patient assistance, however, patient does not qualify for either option based on income and low out of pocket spend.  - Moving forward, could consider addition of SGLT2, pending affordability - Moving forward, could consider addition of Lyrica for peripheral neuropathy, pending affordability   Medication Management - Provided refill on nitroglycerin SL - Moving forward, patient's LDL is uncontrolled (172 on 01/24/2017). Could consider rechallenge of rosuvastatin or pravastatin at low doses with once to twice weekly dosing.   Written pt instructions provided.  F/U Clinic visit with PCP Dr. Andria Frames in 1 month Total time in face to face counseling 40 minutes.  Patient seen with Danella Penton, PharmD Candidate and Catie Darnelle Maffucci, PharmD,  PGY2 Pharmacy Resident.

## 2017-09-19 NOTE — Assessment & Plan Note (Signed)
Diabetes Longstanding uncontrolled diabetes; patient resistant to insulin initiation. Notes difficulties affording Victoza. Uncontrolled diabetes likely contributing to burden of neuropathy. Intolerant to gabapentin and duloxetine. As patient notes significantly elevated SMBG in the morning that trends to 100-200s in the afternoon, glipizide may be wearing off.  - Increased glipizide XL 10 mg from daily to twice daily - Provided samples of Victoza 1.8 mg daily  - Discussed Medicare Extra Help/LIS and Victoza patient assistance, however, patient does not qualify for either option based on income and low out of pocket spend.  - Moving forward, could consider addition of SGLT2, pending affordability - Moving forward, could consider addition of Lyrica for peripheral neuropathy, pending affordability

## 2017-10-01 ENCOUNTER — Ambulatory Visit: Payer: Medicare Other | Admitting: Nurse Practitioner

## 2017-10-01 ENCOUNTER — Encounter: Payer: Self-pay | Admitting: Nurse Practitioner

## 2017-10-01 VITALS — BP 130/72 | HR 90 | Ht 65.5 in | Wt 234.1 lb

## 2017-10-01 DIAGNOSIS — R059 Cough, unspecified: Secondary | ICD-10-CM

## 2017-10-01 DIAGNOSIS — R609 Edema, unspecified: Secondary | ICD-10-CM | POA: Diagnosis not present

## 2017-10-01 DIAGNOSIS — R0602 Shortness of breath: Secondary | ICD-10-CM | POA: Diagnosis not present

## 2017-10-01 DIAGNOSIS — R05 Cough: Secondary | ICD-10-CM

## 2017-10-01 DIAGNOSIS — I480 Paroxysmal atrial fibrillation: Secondary | ICD-10-CM | POA: Diagnosis not present

## 2017-10-01 MED ORDER — AZITHROMYCIN 250 MG PO TABS: ORAL_TABLET | ORAL | 0 refills | Status: DC

## 2017-10-01 NOTE — Patient Instructions (Addendum)
We will be checking the following labs today - BMET, CBC and BNP   Medication Instructions:    Continue with your current medicines.   Ok to take plain Mucinex - this is over the counter - this helps loosen your congestion  I am sending in a RX for Zithromax - take as directed    Testing/Procedures To Be Arranged:  Echocardiogram  Follow-Up:   See me in about 2 weeks     Other Special Instructions:   Let's see what the lab shows - we may need to get back on the iron pills.     If you need a refill on your cardiac medications before your next appointment, please call your pharmacy.   Call the [place] office at [phone] if you have any questions, problems or concerns.

## 2017-10-01 NOTE — Progress Notes (Signed)
CARDIOLOGY OFFICE NOTE  Date:  10/01/2017    [name] Date of Birth: [birth year] Medical Record #[id number]  PCP:  [name], MD  Cardiologist:  Johnsie Cancel   Chief Complaint  Patient presents with  . Hypertension  . Hyperlipidemia  . Shortness of Breath    Work in visit - seen for Dr. Johnsie Cancel    History of Present Illness: Tajanay Hurley is a 77 y.o. female who presents today for a 6 month check. Seen for Dr. Johnsie Cancel.  She has a history of HTN, DM, HLD, obesity and OSA. Long history of PAF. On xarelto -  held in past for gastric ectasia that was ablated. She has a CHADS2VASC of at least 5. Intolerant to flecainide due to visual changes. She has seen Dr. Rayann Heman in the past - declined ablation but noted sotalol could be an option for her.    Last seen here in February.   Comes in today. Here alone. This is actually for a work in visit. She notes several concerns. In the ER back in June with cough and congestion. But notes that she was not doing well for several weeks prior to that time. Still with lingering cough. Has had worsening swelling as well. Sputum was green but now more brown colored - especially in the mornings. Then started developing chest tightness - not exertional related. Typically occurs with rest. Has also had a few bouts of atrial fib - has occurred with the cough and congestion. Sister in [place] is wanting her to come visit her in September but she is worried about traveling. Wanting to make sure she is ok. She is not taking her iron - says she did not know that she should have continued. She is anemic. Remains on Xarelto.   Past Medical History:  Diagnosis Date  . Allergy   . Anemia due to GI blood loss 08/12/2011  . Arthritis   . Asthma   . Atrial flutter ([place]) 12-07-2010   converted in ED with 300 mg flecainide  . CAD (coronary artery disease)    a. mild per cath in 2004;  b. nonischemic Myoview in March 2012;  c. Lex MV 1/14:  EF  66%, no ischemia  . Chronic anticoagulation - Xarelto started 07/07/2015, CHADS2CVASC=5 07/07/2015  . Diastolic CHF ([place]) 08/02/3014  . External hemorrhoids 06/07/2010  . Gastric antral vascular ectasia    source for gi bleed in 07/2011 - Xarelto stopped  . Gastritis   . GERD (gastroesophageal reflux disease)   . Hiatal hernia   . Hyperlipidemia   . Hypertension   . Hypothyroidism   . Neuromuscular disorder ([place])   . Obesity   . Paroxysmal atrial fibrillation ([facility])   . Personal history of colonic polyps 06/06/2009   cecal polyp  . Sleep apnea    does not use CPAP  . 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 Ulcer     Past Surgical History:  Procedure Laterality Date  . BREAST BIOPSY Left   . BREAST EXCISIONAL BIOPSY Left    benign  . CARDIAC CATHETERIZATION  1999&2004  . CARPAL TUNNEL RELEASE Bilateral 2003  . CATARACT EXTRACTION Bilateral   . COLONOSCOPY    . ESOPHAGOGASTRODUODENOSCOPY  08/13/2011   Procedure: ESOPHAGOGASTRODUODENOSCOPY (EGD);  Surgeon: Gatha Mayer, MD;  Location: [facility] ENDOSCOPY;  Service: Endoscopy;  Laterality: N/A;  . POLYPECTOMY       Medications: Current Meds  Medication Sig  . acetaminophen (TYLENOL) 500 MG tablet Take 500 mg by mouth every 6 (six) hours as needed (arthritis pain).  Marland Kitchen amLODipine (NORVASC) 5 MG tablet Take 1 tablet (5 mg total) by mouth daily.  . Cholecalciferol (VITAMIN D3) 2000 units TABS Take 1 capsule by mouth daily.   . Coenzyme Q10 (COQ10 PO) Take 10 mEq daily by mouth.  . colchicine 0.6 MG tablet Take 0.6 mg by mouth as needed (gout flare ups).  . diltiazem (CARDIZEM CD) 360 MG 24 hr capsule TAKE 1 CAPSULE BY MOUTH  DAILY  . diltiazem (CARDIZEM) 30 MG tablet Take 1 tablet by mouth every 4 hours AS NEEDED for heart rate >100 as long as blood pressure >100.  . glipiZIDE (GLUCOTROL XL) 10 MG 24 hr tablet Take 1 tablet (10 mg total) by mouth 2 (two) times daily.  . Lancets (ONETOUCH  ULTRASOFT) lancets Use to check blood sugar daily.  Marland Kitchen levothyroxine (SYNTHROID, LEVOTHROID) 112 MCG tablet TAKE 1 TABLET BY MOUTH  DAILY  . liraglutide (VICTOZA) 18 MG/3ML SOPN Inject 0.3 mLs (1.8 mg total) into the skin daily.  . meclizine (ANTIVERT) 25 MG tablet Take 25 mg by mouth 3 (three) times daily as needed for dizziness or nausea.  . metFORMIN (GLUCOPHAGE-XR) 500 MG 24 hr tablet Take 4 tablets (2,000 mg total) by mouth daily with supper.  . metoprolol succinate (TOPROL-XL) 100 MG 24 hr tablet Take 1 tablet (100 mg total) by mouth 2 (two) times daily. Take with or immediately following a meal.  . Multiple Vitamin (MULTI-VITAMINS) TABS Take daily by mouth.  . nitroGLYCERIN (NITROSTAT) 0.4 MG SL tablet Place 1 tablet (0.4 mg total) under the tongue every 5 (five) minutes as needed for chest pain (Call 911 if chest pain after three doses).  Marland Kitchen omeprazole (PRILOSEC) 40 MG capsule Take 1 capsule (40 mg total) by mouth daily.  . ONE TOUCH ULTRA TEST test strip TEST 3 TIMES DAILY  . rivaroxaban (XARELTO) 20 MG TABS tablet Take 1 tablet (20 mg total) by mouth daily with supper.  . triamcinolone cream (KENALOG) 0.1 % Apply 1 application topically as needed (dermatitis).     Allergies: Allergies  Allergen Reactions  . Lisinopril Swelling    REACTION: swelling, may have had some breathing involvement.   . Nsaids Other (See Comments)    Patient reports internal bleeding  . Penicillins Shortness Of Breath and Swelling    Arm Swelling with Penicillin (Occurred in 1960s) Breathing - throat swelling with Amoxicillin (Occurred prior to 2002)  . Sulfamethoxazole Hives and Itching    "welps all over" immediately after dose  . Actos [Pioglitazone] Swelling    REACTION: swelling all over body  . Flecainide Other (See Comments)    Blurry  vision  . Iodinated Diagnostic Agents Itching    Pt. Developed mild itching after receiving IV cm; pt. Held; Dr. Keane Scrape recomended she take 50 mg of benadryl  when she goes home-if necessary; Dr Mickey Farber recommends benadryl prior to future exams requiring contrast media, but stated other doctors may recommend another premedication prep.  . Statins Other (See Comments)    Muscle aches with multiple statins  . Losartan Potassium Other (See Comments)    Lower extremity swelling  . Zetia [Ezetimibe] Other (See Comments)    Cramps    Social History: The patient  reports that she quit smoking about 24 years ago. Her smoking use included cigarettes. She has a 60.00 pack-year smoking history. She has never used smokeless  tobacco. She reports that she does not drink alcohol or use drugs.   Family History: The patient's family history includes Colon polyps in her daughter; Diabetes in her daughter and maternal grandfather; Heart disease in her father; Hypertension in her mother; Other in her mother; Thyroid disease in her daughter.   Review of Systems: Please see the history of present illness.   Otherwise, the review of systems is positive for none.   All other systems are reviewed and negative.   Physical Exam: VS:  BP 130/72 (BP Location: Left Arm, Patient Position: Sitting, Cuff Size: Large)   Pulse 90   Ht 5' 5.5" (1.664 m)   Wt 234 lb 1.9 oz (106.2 kg)   BMI 38.37 kg/m  .  BMI Body mass index is 38.37 kg/m.  Wt Readings from Last 3 Encounters:  10/01/17 234 lb 1.9 oz (106.2 kg)  09/19/17 237 lb (107.5 kg)  08/15/17 237 lb 3.2 oz (107.6 kg)    General: Pleasant. Alert. She is in no acute distress.   HEENT: Normal.  Neck: Supple, no JVD, carotid bruits, or masses noted.  Cardiac: Regular rate and rhythm. Heart tones are distant. Trace edema.  Respiratory:  Lungs are clear to auscultation bilaterally with normal work of breathing.  GI: Soft and nontender.  MS: No deformity or atrophy. Gait and ROM intact.  Skin: Warm and dry. Color is normal.  Neuro:  Strength and sensation are intact and no gross focal deficits noted.  Psych: Alert,  appropriate and with normal affect.   LABORATORY DATA:  EKG:  EKG is ordered today. This demonstrates NSR with inferior/anterior MI - unchanged.  Lab Results  Component Value Date   WBC 7.1 07/28/2017   HGB 9.7 (L) 07/28/2017   HCT 32.4 (L) 07/28/2017   PLT 315 07/28/2017   GLUCOSE 218 (H) 07/28/2017   CHOL 270 (H) 05/03/2016   TRIG 152 (H) 05/03/2016   HDL 69 05/03/2016   LDLDIRECT 172 (H) 01/24/2017   LDLCALC 171 (H) 05/03/2016   ALT 37 (H) 03/13/2017   AST 25 03/13/2017   NA 136 07/28/2017   K 4.4 07/28/2017   CL 103 07/28/2017   CREATININE 0.92 07/28/2017   BUN 21 (H) 07/28/2017   CO2 24 07/28/2017   TSH 3.790 03/13/2017   INR 1.06 08/14/2011   HGBA1C 10.7 (A) 07/18/2017     BNP (last 3 results) Recent Labs    07/28/17 1355  BNP 31.0    ProBNP (last 3 results) No results for input(s): PROBNP in the last 8760 hours.   Other Studies Reviewed Today:  Cardiac Telemetry Study Highlights 12/2016  NSR Average HR 70  No significant arrhythmias even with palpitations    Myoview Study Highlights 06/2016   There was no ST segment deviation noted during stress.  Nuclear stress EF: 59%.  The study is normal.  This is a low risk study.  The left ventricular ejection fraction is normal (55-65%).     Echo Study Conclusions 09/2015  - Left ventricle: The cavity size was normal. There was mild   concentric hypertrophy. Systolic function was normal. The   estimated ejection fraction was in the range of 55% to 60%. There   is hypokinesis of the basalinferior myocardium. Doppler   parameters are consistent with abnormal left ventricular   relaxation (grade 1 diastolic dysfunction).  Assessment/Plan:  1.  Cough/congestion - for several months - will give her a round of antibiotics. May need chest CT - she has  had prior lung nodule noted.   2. Swelling with shortness of breath/diastolic HF - will get her echo updated.   3. PAF - intolerant to flecainide -  in the past her palpitations did not correlate with PAF on event monitor - she remains in sinus. She has declined an ablation.   4. High risk medicine  5. Anemia - needs rechecking and probably needs to resume iron therapy.   6. Chest tightness - low risk Myoview a year ago - EKG chronically abnormal - may be related to anemia - will see what the echo shows, treat for bacterial bronchitis - further disposition to follow.   7. HTN - BP ok here today  8. History of biliary colic  Current medicines are reviewed with the patient today.  The patient does not have concerns regarding medicines other than what has been noted above.  The following changes have been made:  See above.  Labs/ tests ordered today include:    Orders Placed This Encounter  Procedures  . Basic metabolic panel  . CBC  . Pro b natriuretic peptide (BNP)  . EKG 12-Lead  . ECHOCARDIOGRAM COMPLETE     Disposition:   FU with me in about 2 weeks. She may need further testing but hopefully she will improve with antibiotics and resumption of iron therapy.    Patient is agreeable to this plan and will call if any problems develop in the interim.   SignedTruitt Merle, NP  10/01/2017 2:50 PM  [place] [address] [place], [place]  [address] Phone: [phone] Fax: [phone]

## 2017-10-02 ENCOUNTER — Other Ambulatory Visit: Payer: Self-pay | Admitting: *Deleted

## 2017-10-02 LAB — BASIC METABOLIC PANEL
BUN/Creatinine Ratio: 21 (ref 12–28)
BUN: 24 mg/dL (ref 8–27)
CO2: 22 mmol/L (ref 20–29)
Calcium: 10.1 mg/dL (ref 8.7–10.3)
Chloride: 102 mmol/L (ref 96–106)
Creatinine, Ser: 1.13 mg/dL — ABNORMAL HIGH (ref 0.57–1.00)
GFR calc Af Amer: 54 mL/min/{1.73_m2} — ABNORMAL LOW (ref >59)
GFR calc non Af Amer: 47 mL/min/{1.73_m2} — ABNORMAL LOW (ref >59)
Glucose: 203 mg/dL — ABNORMAL HIGH (ref 65–99)
Potassium: 4.8 mmol/L (ref 3.5–5.2)
Sodium: 138 mmol/L (ref 134–144)

## 2017-10-02 LAB — CBC
Hematocrit: 33.3 % — ABNORMAL LOW (ref 34.0–46.6)
Hemoglobin: 9.8 g/dL — ABNORMAL LOW (ref 11.1–15.9)
MCH: 23.9 pg — ABNORMAL LOW (ref 26.6–33.0)
MCHC: 29.4 g/dL — ABNORMAL LOW (ref 31.5–35.7)
MCV: 81 fL (ref 79–97)
Platelets: 374 10*3/uL (ref 150–450)
RBC: 4.1 x10E6/uL (ref 3.77–5.28)
RDW: 17.3 % — ABNORMAL HIGH (ref 12.3–15.4)
WBC: 7.3 10*3/uL (ref 3.4–10.8)

## 2017-10-02 LAB — PRO B NATRIURETIC PEPTIDE: NT-Pro BNP: 85 pg/mL (ref 0–738)

## 2017-10-02 MED ORDER — FERROUS SULFATE 325 (65 FE) MG PO TABS: 325.0000 mg | ORAL_TABLET | Freq: Every day | ORAL | 3 refills | Status: DC

## 2017-10-08 ENCOUNTER — Other Ambulatory Visit: Payer: Self-pay

## 2017-10-08 ENCOUNTER — Ambulatory Visit (HOSPITAL_COMMUNITY): Payer: Medicare Other | Attending: Cardiology

## 2017-10-08 DIAGNOSIS — R059 Cough, unspecified: Secondary | ICD-10-CM

## 2017-10-08 DIAGNOSIS — R05 Cough: Secondary | ICD-10-CM | POA: Diagnosis not present

## 2017-10-08 DIAGNOSIS — E119 Type 2 diabetes mellitus without complications: Secondary | ICD-10-CM | POA: Diagnosis not present

## 2017-10-08 DIAGNOSIS — R609 Edema, unspecified: Secondary | ICD-10-CM | POA: Insufficient documentation

## 2017-10-08 DIAGNOSIS — I4891 Unspecified atrial fibrillation: Secondary | ICD-10-CM | POA: Insufficient documentation

## 2017-10-08 DIAGNOSIS — R600 Localized edema: Secondary | ICD-10-CM | POA: Diagnosis not present

## 2017-10-08 DIAGNOSIS — G4733 Obstructive sleep apnea (adult) (pediatric): Secondary | ICD-10-CM | POA: Diagnosis not present

## 2017-10-08 DIAGNOSIS — R0602 Shortness of breath: Secondary | ICD-10-CM | POA: Diagnosis present

## 2017-10-08 DIAGNOSIS — I10 Essential (primary) hypertension: Secondary | ICD-10-CM | POA: Insufficient documentation

## 2017-10-08 DIAGNOSIS — I480 Paroxysmal atrial fibrillation: Secondary | ICD-10-CM | POA: Diagnosis present

## 2017-10-08 DIAGNOSIS — E785 Hyperlipidemia, unspecified: Secondary | ICD-10-CM | POA: Insufficient documentation

## 2017-10-14 ENCOUNTER — Ambulatory Visit: Payer: Medicare Other | Admitting: Nurse Practitioner

## 2017-10-14 ENCOUNTER — Encounter: Payer: Self-pay | Admitting: Nurse Practitioner

## 2017-10-14 VITALS — BP 146/62 | HR 85 | Ht 65.5 in | Wt 238.0 lb

## 2017-10-14 DIAGNOSIS — I5189 Other ill-defined heart diseases: Secondary | ICD-10-CM

## 2017-10-14 DIAGNOSIS — I10 Essential (primary) hypertension: Secondary | ICD-10-CM

## 2017-10-14 DIAGNOSIS — I2510 Atherosclerotic heart disease of native coronary artery without angina pectoris: Secondary | ICD-10-CM | POA: Diagnosis not present

## 2017-10-14 MED ORDER — DILTIAZEM HCL 30 MG PO TABS: 30.0000 mg | ORAL_TABLET | ORAL | 6 refills | Status: DC | PRN

## 2017-10-14 NOTE — Progress Notes (Signed)
CARDIOLOGY OFFICE NOTE  Date:  10/14/2017    Courtney Pena Date of Birth: 08/11/1940 Medical Record #[id number]  PCP:  [name], MD  Cardiologist:  Johnsie Cancel    Chief Complaint  Patient presents with  . Edema  . Shortness of Breath    Follow up visit - seen for Dr. Johnsie Cancel    History of Present Illness: Courtney Pena is a 77 y.o. female who presents today for a follow up visit. This is a 2 week check. Seen for Dr. Johnsie Cancel.  She has a history of HTN, DM, HLD, obesity and OSA. Long history of PAF. On xarelto -  held in past for gastric ectasia that was ablated. She has a CHADS2VASC of at least 5. Intolerant to flecainide due to visual changes. She has seen Dr. Rayann Heman in the past - declined ablation but noted sotalol could be an option for her.    Last seen here in February by Dr. Johnsie Cancel.  I then saw her about 2 weeks ago as a work in visit. She had several concerns. Had cough and congestion - with brown colored sputum. Some chest tightness. Few bouts of AF. She is planning on going to [facility] next month and wanted reassurance. She has been chronically anemic - had not been taking her iron - this was restarted. Echo was updated. Round of antibiotics given.   Comes in today. Here alone. Doing better. Sputum now clear. Still with some swelling. In looking at her medicines - I see that she is on both Norvasc and high dose Diltiazem along with prn short acting CCB therapy. She admits she is not taking the Toprol BID but just once a day due to lower blood pressure. BP is good at home - tends to be higher in the office. Headed to [place] next month for about a week. Has had some headaches off and on - starts at the back of her neck and she can feel it in her temple region. Otherwise, she feels like she is doing well.   Past Medical History:  Diagnosis Date  . Allergy   . Anemia due to GI blood loss 08/12/2011  . Arthritis   . Asthma   . Atrial flutter ([place])  12-07-2010   converted in ED with 300 mg flecainide  . CAD (coronary artery disease)    a. mild per cath in 2004;  b. nonischemic Myoview in March 2012;  c. Lex MV 1/14:  EF 66%, no ischemia  . Chronic anticoagulation - Xarelto started 07/07/2015, CHADS2CVASC=5 07/07/2015  . Diastolic CHF ([place]) 3/57/0177  . External hemorrhoids 06/07/2010  . Gastric antral vascular ectasia    source for gi bleed in 07/2011 - Xarelto stopped  . Gastritis   . GERD (gastroesophageal reflux disease)   . Hiatal hernia   . Hyperlipidemia   . Hypertension   . Hypothyroidism   . Neuromuscular disorder ([place])   . Obesity   . Paroxysmal atrial fibrillation (HCC)   . Personal history of colonic polyps 06/06/2009   cecal polyp  . Sleep apnea    does not use CPAP  . Type 2 diabetes mellitus with diabetic chronic kidney disease ([place]) 04/25/2006      . Ulcer     Past Surgical History:  Procedure Laterality Date  . BREAST BIOPSY Left   . BREAST EXCISIONAL BIOPSY Left    benign  . CARDIAC CATHETERIZATION  1999&2004  . CARPAL TUNNEL RELEASE Bilateral 2003  . CATARACT EXTRACTION Bilateral   .  COLONOSCOPY    . ESOPHAGOGASTRODUODENOSCOPY  08/13/2011   Procedure: ESOPHAGOGASTRODUODENOSCOPY (EGD);  Surgeon: Gatha Mayer, MD;  Location: [facility] ENDOSCOPY;  Service: Endoscopy;  Laterality: N/A;  . POLYPECTOMY       Medications: Current Meds  Medication Sig  . acetaminophen (TYLENOL) 500 MG tablet Take 500 mg by mouth every 6 (six) hours as needed (arthritis pain).  . Cholecalciferol (VITAMIN D3) 2000 units TABS Take 1 capsule by mouth daily.   . Coenzyme Q10 (COQ10 PO) Take 10 mEq daily by mouth.  . colchicine 0.6 MG tablet Take 0.6 mg by mouth as needed (gout flare ups).  . diltiazem (CARDIZEM CD) 360 MG 24 hr capsule TAKE 1 CAPSULE BY MOUTH  DAILY  . ferrous sulfate (FERROUSUL) 325 (65 FE) MG tablet Take 1 tablet (325 mg total) by mouth daily with breakfast.  . glipiZIDE (GLUCOTROL XL) 10 MG 24 hr tablet Take 1  tablet (10 mg total) by mouth 2 (two) times daily.  . Lancets (ONETOUCH ULTRASOFT) lancets Use to check blood sugar daily.  Marland Kitchen levothyroxine (SYNTHROID, LEVOTHROID) 112 MCG tablet TAKE 1 TABLET BY MOUTH  DAILY  . liraglutide (VICTOZA) 18 MG/3ML SOPN Inject 0.3 mLs (1.8 mg total) into the skin daily.  . meclizine (ANTIVERT) 25 MG tablet Take 25 mg by mouth 3 (three) times daily as needed for dizziness or nausea.  . metFORMIN (GLUCOPHAGE-XR) 500 MG 24 hr tablet Take 4 tablets (2,000 mg total) by mouth daily with supper.  . metoprolol succinate (TOPROL-XL) 100 MG 24 hr tablet Take 1 tablet (100 mg total) by mouth 2 (two) times daily. Take with or immediately following a meal.  . Multiple Vitamin (MULTI-VITAMINS) TABS Take daily by mouth.  . nitroGLYCERIN (NITROSTAT) 0.4 MG SL tablet Place 1 tablet (0.4 mg total) under the tongue every 5 (five) minutes as needed for chest pain (Call 911 if chest pain after three doses).  Marland Kitchen omeprazole (PRILOSEC) 40 MG capsule Take 1 capsule (40 mg total) by mouth daily.  . ONE TOUCH ULTRA TEST test strip TEST 3 TIMES DAILY  . rivaroxaban (XARELTO) 20 MG TABS tablet Take 1 tablet (20 mg total) by mouth daily with supper.  . triamcinolone cream (KENALOG) 0.1 % Apply 1 application topically as needed (dermatitis).  . [DISCONTINUED] amLODipine (NORVASC) 5 MG tablet Take 1 tablet (5 mg total) by mouth daily.  . [DISCONTINUED] diltiazem (CARDIZEM) 30 MG tablet Take 1 tablet by mouth every 4 hours AS NEEDED for heart rate >100 as long as blood pressure >100.     Allergies: Allergies  Allergen Reactions  . Lisinopril Swelling    REACTION: swelling, may have had some breathing involvement.   . Nsaids Other (See Comments)    Patient reports internal bleeding  . Penicillins Shortness Of Breath and Swelling    Arm Swelling with Penicillin (Occurred in 1960s) Breathing - throat swelling with Amoxicillin (Occurred prior to 2002)  . Sulfamethoxazole Hives and Itching     "welps all over" immediately after dose  . Actos [Pioglitazone] Swelling    REACTION: swelling all over body  . Flecainide Other (See Comments)    Blurry  vision  . Iodinated Diagnostic Agents Itching    Pt. Developed mild itching after receiving IV cm; pt. Held; Dr. Keane Scrape recomended she take 50 mg of benadryl when she goes home-if necessary; Dr Mickey Farber recommends benadryl prior to future exams requiring contrast media, but stated other doctors may recommend another premedication prep.  . Statins Other (See Comments)  Muscle aches with multiple statins  . Losartan Potassium Other (See Comments)    Lower extremity swelling  . Zetia [Ezetimibe] Other (See Comments)    Cramps    Social History: The patient  reports that she quit smoking about 24 years ago. Her smoking use included cigarettes. She has a 60.00 pack-year smoking history. She has never used smokeless tobacco. She reports that she does not drink alcohol or use drugs.   Family History: The patient's family history includes Colon polyps in her daughter; Diabetes in her daughter and maternal grandfather; Heart disease in her father; Hypertension in her mother; Other in her mother; Thyroid disease in her daughter.   Review of Systems: Please see the history of present illness.   Otherwise, the review of systems is positive for none.   All other systems are reviewed and negative.   Physical Exam: VS:  BP (!) 146/62 (BP Location: Left Arm, Patient Position: Sitting, Cuff Size: Normal)   Pulse 85   Ht 5' 5.5" (1.664 m)   Wt 238 lb (108 kg)   SpO2 95% Comment: at rest  BMI 39.00 kg/m  .  BMI Body mass index is 39 kg/m.  Wt Readings from Last 3 Encounters:  10/14/17 238 lb (108 kg)  10/01/17 234 lb 1.9 oz (106.2 kg)  09/19/17 237 lb (107.5 kg)   BP recheck by me is 122/60  General: Pleasant. Well developed, well nourished and in no acute distress.   HEENT: Normal.  Neck: Supple, no JVD, carotid bruits, or masses  noted.  Cardiac: Regular rate and rhythm. Soft outflow murmur. Over 1+ edema.  Respiratory:  Lungs are clear to auscultation bilaterally with normal work of breathing.  GI: Soft and nontender.  MS: No deformity or atrophy. Gait and ROM intact.  Skin: Warm and dry. Color is normal.  Neuro:  Strength and sensation are intact and no gross focal deficits noted.  Psych: Alert, appropriate and with normal affect.   LABORATORY DATA:  EKG:  EKG is not ordered today.  Lab Results  Component Value Date   WBC 7.3 10/01/2017   HGB 9.8 (L) 10/01/2017   HCT 33.3 (L) 10/01/2017   PLT 374 10/01/2017   GLUCOSE 203 (H) 10/01/2017   CHOL 270 (H) 05/03/2016   TRIG 152 (H) 05/03/2016   HDL 69 05/03/2016   LDLDIRECT 172 (H) 01/24/2017   LDLCALC 171 (H) 05/03/2016   ALT 37 (H) 03/13/2017   AST 25 03/13/2017   NA 138 10/01/2017   K 4.8 10/01/2017   CL 102 10/01/2017   CREATININE 1.13 (H) 10/01/2017   BUN 24 10/01/2017   CO2 22 10/01/2017   TSH 3.790 03/13/2017   INR 1.06 08/14/2011   HGBA1C 10.7 (A) 07/18/2017     BNP (last 3 results) Recent Labs    07/28/17 1355  BNP 31.0    ProBNP (last 3 results) Recent Labs    10/01/17 1506  PROBNP 85     Other Studies Reviewed Today:  Echo Study Conclusions 09/2017  - Left ventricle: The cavity size was mildly dilated. Wall   thickness was normal. Systolic function was normal. The estimated   ejection fraction was in the range of 55% to 60%. Wall motion was   normal; there were no regional wall motion abnormalities. Doppler   parameters are consistent with abnormal left ventricular   relaxation (grade 1 diastolic dysfunction). Doppler parameters   are consistent with high ventricular filling pressure. - Mitral valve: Calcified annulus. Mildly  thickened leaflets .  Impressions:  - Normal LV systolic function; mild LVE; mild diastolic   dysfunction.  Cardiac Telemetry Study Highlights 12/2016  NSR Average HR 70  No  significant arrhythmias even with palpitations    Myoview Study Highlights 06/2016   There was no ST segment deviation noted during stress.  Nuclear stress EF: 59%.  The study is normal.  This is a low risk study.  The left ventricular ejection fraction is normal (55-65%).    Echo Study Conclusions 09/2015  - Left ventricle: The cavity size was normal. There was mild concentric hypertrophy. Systolic function was normal. The estimated ejection fraction was in the range of 55% to 60%. There is hypokinesis of the basalinferior myocardium. Doppler parameters are consistent with abnormal left ventricular relaxation (grade 1 diastolic dysfunction).  Assessment/Plan:  1. Cough/congestion - had had for several months - treated with antibiotics. Sputum now clear. No fever. Would follow.   2. Swelling with shortness of breath/diastolic HF - most recent echo is stable. She could certainly have more swelling just with all the CCB therapy she is taking. I have stopped the Norvasc today.   3. PAF - intolerant to flecainide - in the past her palpitations did not correlate with PAF on event monitor - she remains in sinus. She has declined an ablation. She will go back to BID dosing of her Toprol.   4. High risk medicine  5. Anemia - she has gotten back on her iron.   6. Chest tightness - low risk Myoview a year ago - EKG chronically abnormal - not endorsed today to me. Would follow with current plan of care.   7. HTN - BP recheck by me is ok. See above for changes made today  8. History of biliary colic  9. Headache - sounds like tension headache - she will discuss with PCP. She has no other neuro complaints.   Current medicines are reviewed with the patient today.  The patient does not have concerns regarding medicines other than what has been noted above.  The following changes have been made:  See above.  Labs/ tests ordered today include:   No orders of  the defined types were placed in this encounter.    Disposition:   FU with me in a few months.   Patient is agreeable to this plan and will call if any problems develop in the interim.   SignedTruitt Merle, NP  10/14/2017 2:37 PM  [facility] [address] [place], [place]  [address] Phone: [phone] Fax: [phone]

## 2017-10-14 NOTE — Patient Instructions (Addendum)
We will be checking the following labs today - NONE   Medication Instructions:    Continue with your current medicines. BUT  I am stopping the Amlodipine  Ok to go back to the twice a day dosing of your Metoprolol    Testing/Procedures To Be Arranged:  N/A  Follow-Up:   See me in about 3 months.     Other Special Instructions:   Wear your support stockings - especially on your upcoming flight.     If you need a refill on your cardiac medications before your next appointment, please call your pharmacy.   Call the [place] office at [phone] if you have any questions, problems or concerns.

## 2017-10-16 ENCOUNTER — Ambulatory Visit: Payer: Medicare Other | Admitting: Nurse Practitioner

## 2017-10-21 LAB — HM DIABETES EYE EXAM

## 2017-10-25 ENCOUNTER — Encounter: Payer: Self-pay | Admitting: Family Medicine

## 2017-11-07 ENCOUNTER — Ambulatory Visit: Payer: Medicare Other | Admitting: Pharmacist

## 2017-11-07 ENCOUNTER — Encounter: Payer: Self-pay | Admitting: Pharmacist

## 2017-11-07 DIAGNOSIS — E7800 Pure hypercholesterolemia, unspecified: Secondary | ICD-10-CM

## 2017-11-07 DIAGNOSIS — E1122 Type 2 diabetes mellitus with diabetic chronic kidney disease: Secondary | ICD-10-CM

## 2017-11-07 MED ORDER — LIRAGLUTIDE 18 MG/3ML ~~LOC~~ SOPN: 1.8000 mg | PEN_INJECTOR | Freq: Every day | SUBCUTANEOUS | 0 refills | Status: DC

## 2017-11-07 MED ORDER — RIVAROXABAN 20 MG PO TABS: 20.0000 mg | ORAL_TABLET | Freq: Every day | ORAL | 0 refills | Status: DC

## 2017-11-07 NOTE — Assessment & Plan Note (Signed)
ASCVD risk - secondary prevention in patient with DM. Last LDL is not controlled.  High intensity statin indicated. However, patient has tried low dose, alternate day dosing of hydrophilic statin therapy (rosuvastatin and pravastatin), as well as eztimibe, all with intolerable muscle effects. Patient may be a candidate for PCSK9i therapy - Will send message to cardiologist Truitt Merle, NP to discuss options for scheduling patient with pharmacy clinic at cardiology office

## 2017-11-07 NOTE — Progress Notes (Signed)
S:     Chief Complaint  Patient presents with  . Medication Management    Diabetes    Patient arrives in good spirits, ambulating without assistance. Presents for diabetes evaluation, education, and management at the request of Dr. Andria Frames, referred on 12/2016 for diabetes and 08/15/2017 for PFTs. Last seen by Dr. Andria Frames on 08/15/2017. At last Rx clinic visit on 09/19/2017, glipizide XL 10 mg was increased from once daily to twice daily. In the interim, she saw cardiology and amlodipine was discontinued, metoprolol XL was increased to twice daily.   She reports adherence to these changes. She requests a supply of samples for Victoza and Xarelto today. She reports some improvement in SMBG results since the increase in glipizide. She indicates that she has "not been serious" about watching her carbohydrate intake, and has room for improvement.  Upon review, patient indicates that she has been trialed on low dose, alternate day dosing of rosuvastatin and pravastatin, with muscle intolerability. She also reported muscle aches/cramps with ezetimibe.  Reports feeling sx of "fluttering" last night while she was resting on the sofa, and that she would have taken the diltiazem 30 mg PRN, but was unable to find the prescription. She checked her pulse, but does not know the number, other than it was rapid. She cannot determine a trigger.   Insurance coverage/medication affordability: [facility] but is in the donut hole and unable to afford brand name medications. Has been receiving Victoza and Xarelto samples from clinic.  Patient reports adherence with medications.   Current diabetes medications include: metformin 2000 mg daily, Victoza 1.8 mg daily, glipizide XL 10 mg BID Current hypertension medications include: diltiazem 360 mg daily + 30 mg PRN tachycardia; metoprolol XL 100 mg BID    Patient denies hypoglycemic events, but notes that she historically will feel symptoms of hypoglycemia  when SMBG ~90-100  Patient reports nocturia.  Patient reports neuropathy. Patient denies visual changes. Patient reports self foot exams.   O:  Physical Exam  Constitutional: She appears well-developed and well-nourished.  Vitals reviewed.  Review of Systems  All other systems reviewed and are negative.  Lab Results  Component Value Date   HGBA1C 10.7 (A) 07/18/2017   Vitals:   11/07/17 1010  BP: 140/86  Pulse: 70  SpO2: 95%    Lipid Panel     Component Value Date/Time   CHOL 270 (H) 05/03/2016 1412   TRIG 152 (H) 05/03/2016 1412   HDL 69 05/03/2016 1412   CHOLHDL 3.9 05/03/2016 1412   CHOLHDL 4.7 01/27/2015 1238   VLDL 28 01/27/2015 1238   LDLCALC 171 (H) 05/03/2016 1412   LDLDIRECT 172 (H) 01/24/2017 1008   LDLDIRECT 178 (H) 05/12/2015 1229    Home fasting CBG: Improvement, but still ranging 245-300 Before supper: 150s-200s   SMBP: 130s-135s/90s-100s  Clinical ASCVD: Yes   A/P: Diabetes longstanding currently uncontrolled, mildly improved per patient report. Patient is able to verbalize appropriate hypoglycemia management plan. Patient is adherent with medication. Control is suboptimal due to diet, insulin insensitivity. Would be appropriate to initiate basal insulin at this time, however, patient expresses a significant phobia of insulin treatment. She requests to trial a few weeks of intensive dietary modification prior to initiation of insulin - Continued metformin 1000 mg BID, glipizide XL 10 mg BID, Victoza 1.8 mg daily. Victoza samples provided - Patient will work specifically on reduction in carbohydrate intake and increasing physical activity in the next 4 weeks - Extensively discussed pathophysiology  of DM, recommended lifestyle interventions, dietary effects on glycemic control - Counseled on s/sx of and management of hypoglycemia  ASCVD risk - secondary prevention in patient with DM. Last LDL is not controlled.  High intensity statin indicated.  However, patient has tried low dose, alternate day dosing of hydrophilic statin therapy (rosuvastatin and pravastatin), as well as eztimibe, all with intolerable muscle effects. Patient may be a candidate for PCSK9i therapy - Will send message to cardiologist Truitt Merle, NP to discuss options for scheduling patient with pharmacy clinic at cardiology office   Written patient instructions provided.  Total time in face to face counseling 45 minutes.   Follow up Pharmacist Clinic Visit in 4 weeks. Patient seen with Sharyne Peach, PharmD Candidate, Isaias Sakai, PharmD, PGY1 Pharmacy Resident, and Catie Darnelle Maffucci, PharmD,  PGY2 Pharmacy Resident.

## 2017-11-07 NOTE — Patient Instructions (Addendum)
It was great to see you today!   We gave you samples for Victoza today, and we will call you when we get the Xarelto in.    Schedule follow up with [name] in 1 month.

## 2017-11-07 NOTE — Assessment & Plan Note (Signed)
Diabetes longstanding currently uncontrolled, mildly improved per patient report. Patient is able to verbalize appropriate hypoglycemia management plan. Patient is adherent with medication. Control is suboptimal due to diet, insulin insensitivity. Would be appropriate to initiate basal insulin at this time, however, patient expresses a significant phobia of insulin treatment. She requests to trial a few weeks of intensive dietary modification prior to initiation of insulin - Continued metformin 1000 mg BID, glipizide XL 10 mg BID, Victoza 1.8 mg daily. Victoza samples provided - Patient will work specifically on reduction in carbohydrate intake and increasing physical activity in the next 4 weeks - Extensively discussed pathophysiology of DM, recommended lifestyle interventions, dietary effects on glycemic control - Counseled on s/sx of and management of hypoglycemia

## 2017-11-08 NOTE — Progress Notes (Signed)
Can we see about referring her to our lipid clinic per PCP request.

## 2017-11-11 ENCOUNTER — Other Ambulatory Visit: Payer: Self-pay | Admitting: *Deleted

## 2017-11-11 ENCOUNTER — Telehealth: Payer: Self-pay | Admitting: *Deleted

## 2017-11-11 DIAGNOSIS — E1169 Type 2 diabetes mellitus with other specified complication: Secondary | ICD-10-CM

## 2017-11-11 DIAGNOSIS — E7800 Pure hypercholesterolemia, unspecified: Secondary | ICD-10-CM

## 2017-11-11 DIAGNOSIS — E785 Hyperlipidemia, unspecified: Principal | ICD-10-CM

## 2017-11-11 NOTE — Telephone Encounter (Signed)
-----   Message from Burtis Junes, NP sent at 11/08/2017  8:38 AM EDT -----   ----- Message ----- From: Leavy Cella, [facility] Sent: 11/07/2017   5:14 PM EDT To: Burtis Junes, NP

## 2017-11-11 NOTE — Telephone Encounter (Signed)
lvm for pt to call back office to schedule lipid appt.

## 2017-11-11 NOTE — Telephone Encounter (Signed)
  S/w pt was just stepping out of shower, call back after 12 pm once pt goes to work and has a schedule to check.  Referral placed in system for lipid clinic. Appt has to be made.

## 2017-11-12 NOTE — Progress Notes (Signed)
Patient ID: Courtney Pena, female   DOB: [birth year], 77 y.o.   MRN: [id number] I reviewed and agree with Dr. Graylin Shiver documentation and management.

## 2017-11-14 ENCOUNTER — Other Ambulatory Visit: Payer: Self-pay | Admitting: Family Medicine

## 2017-11-14 DIAGNOSIS — D500 Iron deficiency anemia secondary to blood loss (chronic): Secondary | ICD-10-CM

## 2017-11-15 NOTE — Telephone Encounter (Signed)
lvm to schedule pt a new lipid clinic appt.

## 2017-11-21 ENCOUNTER — Ambulatory Visit: Payer: Medicare Other | Admitting: Family Medicine

## 2017-11-27 NOTE — Telephone Encounter (Signed)
-----   Message from Burtis Junes, NP sent at 11/08/2017  8:38 AM EDT -----   ----- Message ----- From: Leavy Cella, [facility] Sent: 11/07/2017   5:14 PM EDT To: Burtis Junes, NP

## 2017-11-27 NOTE — Telephone Encounter (Signed)
S/w pt called office and scheduled appt for lipid clinic 10/7.

## 2017-11-28 ENCOUNTER — Encounter: Payer: Self-pay | Admitting: Family Medicine

## 2017-11-28 ENCOUNTER — Other Ambulatory Visit: Payer: Self-pay

## 2017-11-28 ENCOUNTER — Ambulatory Visit: Payer: Medicare Other | Admitting: Family Medicine

## 2017-11-28 VITALS — BP 148/72 | HR 74 | Temp 98.4°F | Ht 65.0 in | Wt 236.0 lb

## 2017-11-28 DIAGNOSIS — D500 Iron deficiency anemia secondary to blood loss (chronic): Secondary | ICD-10-CM

## 2017-11-28 DIAGNOSIS — E1122 Type 2 diabetes mellitus with diabetic chronic kidney disease: Secondary | ICD-10-CM | POA: Diagnosis not present

## 2017-11-28 DIAGNOSIS — E1142 Type 2 diabetes mellitus with diabetic polyneuropathy: Secondary | ICD-10-CM

## 2017-11-28 DIAGNOSIS — I10 Essential (primary) hypertension: Secondary | ICD-10-CM

## 2017-11-28 DIAGNOSIS — E7800 Pure hypercholesterolemia, unspecified: Secondary | ICD-10-CM

## 2017-11-28 DIAGNOSIS — R51 Headache: Secondary | ICD-10-CM

## 2017-11-28 DIAGNOSIS — R740 Nonspecific elevation of levels of transaminase and lactic acid dehydrogenase [LDH]: Secondary | ICD-10-CM

## 2017-11-28 DIAGNOSIS — Z23 Encounter for immunization: Secondary | ICD-10-CM | POA: Diagnosis not present

## 2017-11-28 DIAGNOSIS — R519 Headache, unspecified: Secondary | ICD-10-CM | POA: Insufficient documentation

## 2017-11-28 DIAGNOSIS — E038 Other specified hypothyroidism: Secondary | ICD-10-CM

## 2017-11-28 DIAGNOSIS — R7401 Elevation of levels of liver transaminase levels: Secondary | ICD-10-CM

## 2017-11-28 LAB — POCT GLYCOSYLATED HEMOGLOBIN (HGB A1C): HbA1c, POC (controlled diabetic range): 9.8 % — AB (ref 0.0–7.0)

## 2017-11-28 MED ORDER — AMLODIPINE BESYLATE 5 MG PO TABS: 5.0000 mg | ORAL_TABLET | Freq: Every day | ORAL | 3 refills | Status: DC

## 2017-11-28 NOTE — Patient Instructions (Signed)
As we discussed, the key is better control of blood pressure, diabetes and hypertension to reduce your considerable risk of a heart attack or stroke.  I added amlodipine.  Work with lipid clinic on cholesterol.  Work with Dr. Valentina Lucks on diabetes.  I will call with the blood test results  I don't want to be too much of a downer.  You are in much better shape than the average 77 yo.  I am just trying to motivate you so it stays that way.

## 2017-11-28 NOTE — Assessment & Plan Note (Addendum)
Add amlodipine again  We need to better control all ASCVD risk factors.

## 2017-11-28 NOTE — Assessment & Plan Note (Signed)
Ten year risk based on current numbers is 55%.  Ideal control would drop to 13%.  Go to lipid clinic.

## 2017-11-29 ENCOUNTER — Encounter: Payer: Self-pay | Admitting: Family Medicine

## 2017-11-29 LAB — CBC
Hematocrit: 32.2 % — ABNORMAL LOW (ref 34.0–46.6)
Hemoglobin: 9.7 g/dL — ABNORMAL LOW (ref 11.1–15.9)
MCH: 24.2 pg — ABNORMAL LOW (ref 26.6–33.0)
MCHC: 30.1 g/dL — ABNORMAL LOW (ref 31.5–35.7)
MCV: 80 fL (ref 79–97)
Platelets: 370 10*3/uL (ref 150–450)
RBC: 4.01 x10E6/uL (ref 3.77–5.28)
RDW: 17.5 % — ABNORMAL HIGH (ref 12.3–15.4)
WBC: 8.7 10*3/uL (ref 3.4–10.8)

## 2017-11-29 LAB — CMP14+EGFR
ALT: 12 IU/L (ref 0–32)
AST: 10 IU/L (ref 0–40)
Albumin/Globulin Ratio: 1.4 (ref 1.2–2.2)
Albumin: 4.3 g/dL (ref 3.5–4.8)
Alkaline Phosphatase: 100 IU/L (ref 39–117)
BUN/Creatinine Ratio: 28 (ref 12–28)
BUN: 24 mg/dL (ref 8–27)
Bilirubin Total: <0.2 mg/dL (ref 0.0–1.2)
CO2: 22 mmol/L (ref 20–29)
Calcium: 9.3 mg/dL (ref 8.7–10.3)
Chloride: 99 mmol/L (ref 96–106)
Creatinine, Ser: 0.86 mg/dL (ref 0.57–1.00)
GFR calc Af Amer: 75 mL/min/{1.73_m2} (ref >59)
GFR calc non Af Amer: 65 mL/min/{1.73_m2} (ref >59)
Globulin, Total: 3 g/dL (ref 1.5–4.5)
Glucose: 274 mg/dL — ABNORMAL HIGH (ref 65–99)
Potassium: 4.7 mmol/L (ref 3.5–5.2)
Sodium: 136 mmol/L (ref 134–144)
Total Protein: 7.3 g/dL (ref 6.0–8.5)

## 2017-11-29 LAB — SEDIMENTATION RATE: Sed Rate: 59 mm/hr — ABNORMAL HIGH (ref 0–40)

## 2017-11-29 LAB — TSH: TSH: 2.16 u[IU]/mL (ref 0.450–4.500)

## 2017-11-29 LAB — FERRITIN: Ferritin: 12 ng/mL — ABNORMAL LOW (ref 15–150)

## 2017-11-29 NOTE — Assessment & Plan Note (Signed)
Sed rate done and it is in the unhelpful middle.  Called with results and she is headache free today.  Monitor.  She will call back if headache is worse.  Clinically, low likelihood of temporal arteritis.

## 2017-11-29 NOTE — Progress Notes (Signed)
   Subjective:    Patient ID: Courtney Pena, female    DOB: 07/11/1940, 77 y.o.   MRN: [id number]  HPI FU Multiple problems. 1. DM Slightly improved but still poor control.  She is working with our Child psychotherapist to improve. 2. Hx of transaminitis, No recent labs.  No symptoms.   3. Hx of anemia due to chronic GI loss.  On daily iron.  Wants to know if she is making progress. 4. Headache daily for last 10 days.  Bitemporal Left >right.  No visual changes. 5. Hypertension.  BP up today. 6. High cholesterol, intollerant of many statin. 7. Very high ASCVD risk.   55% based on current numbers. Could be as low as 13% with ideal control.  We reviewed this and it seemed to motivate her.  8. Needs flu shot   Review of Systems     Objective:   Physical Exam HEENT no temporal artery tenderness. Neck supple Lungs clear Cardiac RRR without m or g        Assessment & Plan:

## 2017-11-29 NOTE — Assessment & Plan Note (Signed)
Given lab results.  Important to stay on iron.  She is just holding her own.

## 2017-11-29 NOTE — Assessment & Plan Note (Signed)
Poor control Refer back to [employer].

## 2017-11-29 NOTE — Assessment & Plan Note (Signed)
Stable on current meds 

## 2017-11-29 NOTE — Assessment & Plan Note (Addendum)
>>  ASSESSMENT AND PLAN FOR TYPE 2 DIABETES MELLITUS WITH DIABETIC CHRONIC KIDNEY DISEASE (HCC) WRITTEN ON 11/29/2017  2:41 PM BY Bard Haupert, Santiago Bumpers, MD  Poor control.  Back to Dr. Raymondo Band   >>ASSESSMENT AND PLAN FOR TYPE 2 DIABETES MELLITUS WITH DIABETIC NEUROPATHY, UNSPECIFIED (HCC) WRITTEN ON 11/29/2017  2:37 PM BY Betsaida [place], Santiago Bumpers, MD  Poor control Refer back to [facility].

## 2017-12-02 ENCOUNTER — Ambulatory Visit: Payer: Medicare Other | Admitting: Pharmacist

## 2017-12-02 DIAGNOSIS — E7800 Pure hypercholesterolemia, unspecified: Secondary | ICD-10-CM

## 2017-12-02 NOTE — Patient Instructions (Signed)
It was nice to meet you today.  We will submit a claim to your insurance company to get approval for [place]. Once we receive word, we will submit for the patient assistance program.  We will follow up with you in the few weeks or so once we receive approval.  If you have any questions or concerns, please call us at [phone]

## 2017-12-02 NOTE — Progress Notes (Signed)
Patient ID: Courtney Pena                 DOB: 04/01/1940                    MRN: [id number]     HPI: Courtney Pena is a 77 y.o. female patient referred to lipid clinic by Dr Andria Frames and an established patient of Dr. Johnsie Cancel. PMH is significant for HTN, DM, HLD,CAD obesity, pAF andOSA. She had a CT angiogram 12/2015 that showed aortic atherosclerosis. She presents today to the lipid clinic for an initial visit.  She presents today in good spirits. She reports intolerances to multiple antihyperlipidemic medications. She has tried ezetimibe 10 mg daily (leg cramping), pravastatin up to 40 mg daily (severe leg cramping starting at the bottoms of her feet), and rosuvastatin 5 mg once weekly (severe leg cramping within a few weeks). Her last direct LDL was 172 in 2018. She reports a relatively healthy diet avoiding red meats and is working to control her portions. She is limited in physical activity due to her neuropathy and arthritis.  Current Medications: CoQ10 100mg  supplement daily  Intolerances:  ezetimibe 10 mg daily (leg cramping), pravastatin up to 40 mg daily (severe leg cramping starting at the bottoms of her feet), and rosuvastatin 5 mg once weekly (severe leg cramping) Risk Factors: HTN, DM, aortic atherosclerosis LDL goal: < 70  Diet: Doesn't eat breakfast. Lunch: salad, chicken, sandwiches, smoothies (kale, blackberries, raspberries). Dinner: more chicken (baked and fried), fish, very little red meat, no pork, broccoli, salads. Reports probably too much starch: but eats brown rice, few potatoes, periodically french fries, eating pasta. Reports eating out for most meals of the week. Has reduced portions and avoids snacking at night. Reports eating too much sugar and working towards improving diet to avoid needing to take insulin. Mainly drinks water.    Exercise:  Exercise is limited by neuropathy and arthritis; tries to walk as much as she can, about 10 minutes at a time.     Family History: The patient's family history includes Colon polyps in her daughter; Diabetes in her daughter and maternal grandfather; Heart disease in her father; Hypertension in her mother; Other in her mother; Thyroid disease in her daughter.   Social History: (-) alcohol, (-) tobacco, former smoker quit in [place]: 01/24/2017: Direct LDL 172  05/03/2016: Total 270, HDL 69, LDL 171, TG 152 01/27/2015: Total 242, HDL 52, LDL 162, TG 141  Past Medical History:  Diagnosis Date  . Allergy   . Anemia due to GI blood loss 08/12/2011  . Arthritis   . Asthma   . Atrial flutter ([place]) 12-07-2010   converted in ED with 300 mg flecainide  . CAD (coronary artery disease)    a. mild per cath in 2004;  b. nonischemic Myoview in March 2012;  c. Lex MV 1/14:  EF 66%, no ischemia  . Chronic anticoagulation - Xarelto started 07/07/2015, CHADS2CVASC=5 07/07/2015  . Diastolic CHF ([place]) 2/45/8099  . External hemorrhoids 06/07/2010  . Gastric antral vascular ectasia    source for gi bleed in 07/2011 - Xarelto stopped  . Gastritis   . GERD (gastroesophageal reflux disease)   . Hiatal hernia   . Hyperlipidemia   . Hypertension   . Hypothyroidism   . Neuromuscular disorder ([place])   . Obesity   . Paroxysmal atrial fibrillation ([facility])   . Personal history of colonic polyps 06/06/2009   cecal polyp  . Sleep  apnea    does not use CPAP  . Type 2 diabetes mellitus with diabetic chronic kidney disease ([place]) 04/25/2006      . Ulcer     Current Outpatient Medications on File Prior to Visit  Medication Sig Dispense Refill  . acetaminophen (TYLENOL) 500 MG tablet Take 500 mg by mouth every 6 (six) hours as needed (arthritis pain).    Marland Kitchen amLODipine (NORVASC) 5 MG tablet Take 1 tablet (5 mg total) by mouth daily. 90 tablet 3  . Cholecalciferol (VITAMIN D3) 2000 units TABS Take 2 capsules by mouth daily.     . Coenzyme Q10 (COQ10 PO) Take 10 mEq daily by mouth.    . colchicine 0.6 MG tablet Take 0.6 mg by  mouth as needed (gout flare ups).    . diltiazem (CARDIZEM CD) 360 MG 24 hr capsule TAKE 1 CAPSULE BY MOUTH  DAILY 90 capsule 3  . diltiazem (CARDIZEM) 30 MG tablet Take 1 tablet (30 mg total) by mouth every 4 (four) hours as needed (elevated HR as long as BP > 100). (Patient not taking: Reported on 11/07/2017) 30 tablet 6  . ferrous sulfate (FERROUSUL) 325 (65 FE) MG tablet Take 1 tablet (325 mg total) by mouth daily with breakfast. (Patient not taking: Reported on 11/07/2017)  3  . glipiZIDE (GLUCOTROL XL) 10 MG 24 hr tablet Take 1 tablet (10 mg total) by mouth 2 (two) times daily. 60 tablet 2  . Lancets (ONETOUCH ULTRASOFT) lancets Use to check blood sugar daily. 100 each 3  . levothyroxine (SYNTHROID, LEVOTHROID) 112 MCG tablet TAKE 1 TABLET BY MOUTH  DAILY 90 tablet 3  . liraglutide (VICTOZA) 18 MG/3ML SOPN Inject 0.3 mLs (1.8 mg total) into the skin daily. 3 pen 0  . meclizine (ANTIVERT) 25 MG tablet Take 25 mg by mouth 3 (three) times daily as needed for dizziness or nausea.    . metFORMIN (GLUCOPHAGE-XR) 500 MG 24 hr tablet Take 4 tablets (2,000 mg total) by mouth daily with supper. 360 tablet 3  . metoprolol succinate (TOPROL-XL) 100 MG 24 hr tablet Take 1 tablet (100 mg total) by mouth 2 (two) times daily. Take with or immediately following a meal. 180 tablet 3  . Multiple Vitamin (MULTI-VITAMINS) TABS Take daily by mouth.    . nitroGLYCERIN (NITROSTAT) 0.4 MG SL tablet Place 1 tablet (0.4 mg total) under the tongue every 5 (five) minutes as needed for chest pain (Call 911 if chest pain after three doses). 25 tablet 3  . omeprazole (PRILOSEC) 40 MG capsule TAKE 1 CAPSULE BY MOUTH  DAILY 90 capsule 3  . ONE TOUCH ULTRA TEST test strip TEST 3 TIMES DAILY 300 each 3  . rivaroxaban (XARELTO) 20 MG TABS tablet Take 1 tablet (20 mg total) by mouth daily with supper. 28 tablet 0  . rivaroxaban (XARELTO) 20 MG TABS tablet Take 1 tablet (20 mg total) by mouth daily with supper. 84 tablet 0  .  triamcinolone cream (KENALOG) 0.1 % Apply 1 application topically as needed (dermatitis).     No current facility-administered medications on file prior to visit.     Allergies  Allergen Reactions  . Lisinopril Swelling    REACTION: swelling, may have had some breathing involvement.   . Nsaids Other (See Comments)    Patient reports internal bleeding  . Penicillins Shortness Of Breath and Swelling    Arm Swelling with Penicillin (Occurred in 1960s) Breathing - throat swelling with Amoxicillin (Occurred prior to 2002)  .  Sulfamethoxazole Hives and Itching    "welps all over" immediately after dose  . Actos [Pioglitazone] Swelling    REACTION: swelling all over body  . Flecainide Other (See Comments)    Blurry  vision  . Iodinated Diagnostic Agents Itching    Pt. Developed mild itching after receiving IV cm; pt. Held; Dr. Keane Scrape recomended she take 50 mg of benadryl when she goes home-if necessary; Dr Mickey Farber recommends benadryl prior to future exams requiring contrast media, but stated other doctors may recommend another premedication prep.  . Statins Other (See Comments)    Muscle aches with multiple statins  . Losartan Potassium Other (See Comments)    Lower extremity swelling  . Zetia [Ezetimibe] Other (See Comments)    Cramps    Assessment/Plan:  1. Hyperlipidemia -  Patient above goal LDL < 70 on no medications. She reports multiple intolerances including ezetimibe 10 mg daily (leg cramping), pravastatin up to 40 mg daily (severe leg cramping starting at the bottoms of her feet), and rosuvastatin 5 mg once weekly (severe leg cramping). Pt would benefit from intensive LDL lowering given aortic atherosclerosis, HTN, DM and current LDL. Given history of intolerances, would benefit from [facility]. She reports a relatively healthy diet; however, she does admit to eating out for the majority of her meals. She is actively working towards cutting down her sugar intake and carbs. Her  activity is limited by arthritis and neuropathy, but she is working towards increasing her physical activity. Will obtain updated baseline lipid panel today, patient reports she is fasting. Will submit Repatha PA to insurance company once we receive updated labs. Pt filled out Safety Net Application in case cost is prohibitive. Follow up in lipid clinic ~2 months after starting Repatha for lipid panel and clinic visit.   Courtney Pena, PharmD PGY2 Cardiology Pharmacy Resident 12/02/2017 5:09 PM

## 2017-12-03 ENCOUNTER — Telehealth: Payer: Self-pay | Admitting: Pharmacist

## 2017-12-03 LAB — LIPID PANEL
Chol/HDL Ratio: 4.2 ratio (ref 0.0–4.4)
Cholesterol, Total: 274 mg/dL — ABNORMAL HIGH (ref 100–199)
HDL: 65 mg/dL (ref >39)
LDL Calculated: 185 mg/dL — ABNORMAL HIGH (ref 0–99)
Triglycerides: 120 mg/dL (ref 0–149)
VLDL Cholesterol Cal: 24 mg/dL (ref 5–40)

## 2017-12-03 MED ORDER — EVOLOCUMAB 140 MG/ML ~~LOC~~ SOAJ: 1.0000 "pen " | SUBCUTANEOUS | 11 refills | Status: DC

## 2017-12-03 NOTE — Telephone Encounter (Signed)
Prior authorization for Repatha has been approved. Rx sent to pharmacy to assess copay.

## 2017-12-11 ENCOUNTER — Telehealth: Payer: Self-pay | Admitting: Pharmacist

## 2017-12-11 NOTE — Telephone Encounter (Signed)
Called [facility] pharmacy and was stuck on hold for 25 minutes before staff stated pt is getting Repatha filled elsewhere but could not see where or what her copay was. We have only sent the prescription to [facility]. [facility] for pt, will need f/u lab work scheduled.

## 2017-12-23 ENCOUNTER — Other Ambulatory Visit: Payer: Self-pay | Admitting: Family Medicine

## 2017-12-23 DIAGNOSIS — E1122 Type 2 diabetes mellitus with diabetic chronic kidney disease: Secondary | ICD-10-CM

## 2018-01-02 ENCOUNTER — Encounter: Payer: Self-pay | Admitting: Pharmacist

## 2018-01-02 ENCOUNTER — Ambulatory Visit: Payer: Medicare Other | Admitting: Pharmacist

## 2018-01-02 DIAGNOSIS — E1122 Type 2 diabetes mellitus with diabetic chronic kidney disease: Secondary | ICD-10-CM | POA: Diagnosis not present

## 2018-01-02 MED ORDER — LIRAGLUTIDE 18 MG/3ML ~~LOC~~ SOPN: 1.8000 mg | PEN_INJECTOR | Freq: Every day | SUBCUTANEOUS | 0 refills | Status: DC

## 2018-01-02 NOTE — Patient Instructions (Signed)
Great to see you today.   No change in therapy.  Next visit in 1 month Rx Clinic.

## 2018-01-02 NOTE — Progress Notes (Signed)
    S:     Chief Complaint  Patient presents with  . Medication Management    Diabetes, Cholesterol    Patient arrives in good spirits, ambulating without assistance.  Presents for diabetes evaluation, education, and management at the request of Dr. Andria Frames. Patient returns to pharmacy clinic for reevaluation of blood sugar control with GLP and Sulfonylurea therapy.  Last seen by Primary Care Provider, Dr. Andria Frames, on 11/28/2017.  Insurance coverage/medication affordability: Continues with donut hole affordability issues at this time.   Patient reports adherence with medications.  However has been out of Liraglutide for a few days.  Current diabetes medications include: Liraglutide 1/8mg  once daily and Glipizide XL 10mg  BID.  Patient denies hypoglycemic events.  Patient-reported exercise habits: unchanged - highly sedentary.  CBD oil on feet (two months prior) reported to help with her  neuropathy.    Patient reports nocturia.  Multiple times per day.  Patient reports neuropathy improved with use of CBD oil applied to feet for a few days over 1 month ago.    O:  Physical Exam  Constitutional: She appears well-developed and well-nourished.  Musculoskeletal: She exhibits no edema.  Vitals reviewed.  Review of Systems  All other systems reviewed and are negative.    Lab Results  Component Value Date   HGBA1C 9.8 (A) 11/28/2017   Vitals:   01/02/18 1020  BP: 122/64  Pulse: 72  SpO2: 98%    Lipid Panel     Component Value Date/Time   CHOL 274 (H) 12/02/2017 1549   TRIG 120 12/02/2017 1549   HDL 65 12/02/2017 1549   CHOLHDL 4.2 12/02/2017 1549   CHOLHDL 4.7 01/27/2015 1238   VLDL 28 01/27/2015 1238   LDLCALC 185 (H) 12/02/2017 1549   LDLDIRECT 172 (H) 01/24/2017 1008   LDLDIRECT 178 (H) 05/12/2015 1229    Home fasting CBG: 175 - 260 2 hour post-prandial/random CBG:  150-200   A/P: Diabetes longstanding remains suboptimally controlled and remains unwilling to  consider insulin therapy. Patient  Reported blood sugars are improved. Control is suboptimal due to longevity of year with diabetes, sedentary lifestyle and dietary indiscretion.  -Continued GLP-1 Vitcoza (generic name liraglutide at max dose. 1 month of samples provided. -Continued Glipizide at max daily dose of 20mg .  At this time, patient remains resistant to the idea of starting any insulin.  -Extensively discussed pathophysiology of DM, recommended lifestyle interventions, dietary effects on glycemic control -Counseled on s/sx of and management of hypoglycemia -Next A1C anticipated in early 2020.    Written patient instructions provided.  Total time in face to face counseling 25 minutes.   Follow up Pharmacist/PCP PRN - likely in Rx clinic in December for assistance with medication supply.  Andee Poles, PharmD Candidate and Harrietta Guardian, PharmD,  PGY1 Pharmacy Resident.

## 2018-01-06 NOTE — Progress Notes (Signed)
Patient ID: Courtney Pena, female   DOB: [birth year], 77 y.o.   MRN: [id number] Reviewed: Agree with Dr. Graylin Shiver documentation and management.

## 2018-01-06 NOTE — Assessment & Plan Note (Signed)
Diabetes longstanding remains suboptimally controlled and remains unwilling to consider insulin therapy. Patient  Reported blood sugars are improved. Control is suboptimal due to longevity of year with diabetes, sedentary lifestyle and dietary indiscretion.  -Continued GLP-1 Vitcoza (generic name liraglutide at max dose. 1 month of samples provided. -Continued Glipizide at max daily dose of 20mg .  At this time, patient remains resistant to the idea of starting any insulin.  -Extensively discussed pathophysiology of DM, recommended lifestyle interventions, dietary effects on glycemic control -Counseled on s/sx of and management of hypoglycemia -Next A1C anticipated in early 2020.

## 2018-01-14 ENCOUNTER — Ambulatory Visit: Payer: Medicare Other | Admitting: Nurse Practitioner

## 2018-01-16 ENCOUNTER — Telehealth: Payer: Self-pay

## 2018-01-16 DIAGNOSIS — E7800 Pure hypercholesterolemia, unspecified: Secondary | ICD-10-CM

## 2018-01-20 NOTE — Telephone Encounter (Signed)
left msg (2nd message) regarding scheduling labs for repatha on the same day as an upcoming visit with dr Roxy Horseman on 02/11/18. I went ahead and scheduled the appt with the lab work on that day. The pt needs to be notified that they should be fasting if possible.

## 2018-02-04 ENCOUNTER — Telehealth: Payer: Self-pay | Admitting: Cardiovascular Disease

## 2018-02-04 NOTE — Progress Notes (Signed)
CARDIOLOGY OFFICE NOTE  Date:  02/05/2018    Courtney Pena Date of Birth: 01-16-1941 Medical Record #[id number]  PCP:  [name], MD  Cardiologist:  Gillian Shields    Chief Complaint  Patient presents with  . Congestive Heart Failure    Follow up visit - seen for Dr. Johnsie Cancel    History of Present Illness: Courtney Pena is a 77 y.o. female who presents today for a follow up visit. Seen for Dr. Johnsie Cancel.  She has a history of HTN, DM, HLD,obesityandOSA. Long history of PAF. On Xarelto -held in past for gastric ectasia that was ablated. She has aCHADS2VASCof at [employer]. Intolerant to flecainidedue tovisual changes.She has seen Dr. Rayann Heman in the past - declined ablation but noted sotalol could be an optionfor her.   Last seen here in February of 2019 by Dr. Johnsie Cancel.  I then saw her in August for a work in for cough and congestion. Was getting ready to travel and wanted "checked out". Echo updated. Round of antibiotics given. Continues to struggle with weight loss.   Comes in today. [place]. She called yesterday with worsening DOE - weight was up - had had more salt/eating out, etc.  No chest pain. Not dizzy. Has had more swelling. She is more anemic. Sounds like she has not been taking her iron regularly. Never started/filled the Repatha. She does try to wear support stockings.    Past Medical History:  Diagnosis Date  . Allergy   . Anemia due to GI blood loss 08/12/2011  . Arthritis   . Asthma   . Atrial flutter ([place]) 12-07-2010   converted in ED with 300 mg flecainide  . CAD (coronary artery disease)    a. mild per cath in 2004;  b. nonischemic Myoview in March 2012;  c. Lex MV 1/14:  EF 66%, no ischemia  . Chronic anticoagulation - Xarelto started 07/07/2015, CHADS2CVASC=5 07/07/2015  . Diastolic CHF ([place]) 08/17/2977  . External hemorrhoids 06/07/2010  . Gastric antral vascular ectasia    source for gi bleed in 07/2011 -  Xarelto stopped  . Gastritis   . GERD (gastroesophageal reflux disease)   . Hiatal hernia   . Hyperlipidemia   . Hypertension   . Hypothyroidism   . Neuromuscular disorder ([place])   . Obesity   . Paroxysmal atrial fibrillation ([facility])   . Personal history of colonic polyps 06/06/2009   cecal polyp  . Sleep apnea    does not use CPAP  . 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 Ulcer     Past Surgical History:  Procedure Laterality Date  . BREAST BIOPSY Left   . BREAST EXCISIONAL BIOPSY Left    benign  . CARDIAC CATHETERIZATION  1999&2004  . CARPAL TUNNEL RELEASE Bilateral 2003  . CATARACT EXTRACTION Bilateral   . COLONOSCOPY    . ESOPHAGOGASTRODUODENOSCOPY  08/13/2011   Procedure: ESOPHAGOGASTRODUODENOSCOPY (EGD);  Surgeon: Gatha Mayer, MD;  Location: [facility] ENDOSCOPY;  Service: Endoscopy;  Laterality: N/A;  . POLYPECTOMY       Medications: Current Meds  Medication Sig  . acetaminophen (TYLENOL) 650 MG CR tablet Take 650 mg by mouth every 8 (eight) hours as needed for pain.  Marland Kitchen amLODipine (NORVASC) 5 MG tablet Take 1 tablet (5 mg total) by mouth daily.  . Cholecalciferol (VITAMIN D3) 2000 units TABS Take 2 capsules by mouth daily.   . Coenzyme Q10 (COQ10) 100 MG  CAPS Take 100 mEq by mouth daily.   . colchicine 0.6 MG tablet Take 0.6 mg by mouth as needed (gout flare ups).  . diltiazem (CARDIZEM CD) 360 MG 24 hr capsule TAKE 1 CAPSULE BY MOUTH  DAILY  . diltiazem (CARDIZEM) 30 MG tablet Take 1 tablet (30 mg total) by mouth every 4 (four) hours as needed (elevated HR as long as BP > 100).  . ferrous sulfate (FERROUSUL) 325 (65 FE) MG tablet Take 1 tablet (325 mg total) by mouth daily with breakfast.  . furosemide (LASIX) 20 MG tablet Take 20 mg by mouth daily.  Marland Kitchen glipiZIDE (GLUCOTROL XL) 10 MG 24 hr tablet Take 1 tablet (10 mg total) by mouth 2 (two) times daily.  . Lancets (ONETOUCH ULTRASOFT) lancets Use to check blood sugar daily.  Marland Kitchen  levothyroxine (SYNTHROID, LEVOTHROID) 112 MCG tablet TAKE 1 TABLET BY MOUTH  DAILY  . liraglutide (VICTOZA) 18 MG/3ML SOPN Inject 0.3 mLs (1.8 mg total) into the skin daily.  . meclizine (ANTIVERT) 25 MG tablet Take 25 mg by mouth 3 (three) times daily as needed for dizziness or nausea.  . metFORMIN (GLUCOPHAGE-XR) 500 MG 24 hr tablet Take 4 tablets (2,000 mg total) by mouth daily with supper.  . metoprolol succinate (TOPROL-XL) 100 MG 24 hr tablet Take 1 tablet (100 mg total) by mouth 2 (two) times daily. Take with or immediately following a meal.  . Multiple Vitamin (MULTI-VITAMINS) TABS Take daily by mouth.  . nitroGLYCERIN (NITROSTAT) 0.4 MG SL tablet Place 1 tablet (0.4 mg total) under the tongue every 5 (five) minutes as needed for chest pain (Call 911 if chest pain after three doses).  Marland Kitchen omeprazole (PRILOSEC) 40 MG capsule TAKE 1 CAPSULE BY MOUTH  DAILY  . ONE TOUCH ULTRA TEST test strip TEST 3 TIMES DAILY  . rivaroxaban (XARELTO) 20 MG TABS tablet Take 1 tablet (20 mg total) by mouth daily with supper.  . triamcinolone cream (KENALOG) 0.1 % Apply 1 application topically as needed (dermatitis).     Allergies: Allergies  Allergen Reactions  . Lisinopril Swelling    REACTION: swelling, may have had some breathing involvement.   . Nsaids Other (See Comments)    Patient reports internal bleeding  . Penicillins Shortness Of Breath and Swelling    Arm Swelling with Penicillin (Occurred in 1960s) Breathing - throat swelling with Amoxicillin (Occurred prior to 2002)  . Sulfamethoxazole Hives and Itching    "welps all over" immediately after dose  . Tolmetin Other (See Comments)    Patient reports internal bleeding  . Actos [Pioglitazone] Swelling    REACTION: swelling all over body  . Flecainide Other (See Comments)    Blurry  vision  . Gabapentin Other (See Comments)    Caused dysphoria   . Iodinated Diagnostic Agents Itching    Pt. Developed mild itching after receiving IV cm;  pt. Held; Dr. Keane Scrape recomended she take 50 mg of benadryl when she goes home-if necessary; Dr Mickey Farber recommends benadryl prior to future exams requiring contrast media, but stated other doctors may recommend another premedication prep.  . Statins Other (See Comments)    Muscle aches with multiple statins  . Losartan Potassium Other (See Comments)    Lower extremity swelling  . Zetia [Ezetimibe] Other (See Comments)    Cramps    Social History: The patient  reports that she quit smoking about 24 years ago. Her smoking use included cigarettes. She has a 60.00 pack-year smoking history. She has never  used smokeless tobacco. She reports that she does not drink alcohol or use drugs.   Family History: The patient's family history includes Colon polyps in her daughter; Diabetes in her daughter and maternal grandfather; Heart disease in her father; Hypertension in her mother; Other in her mother; Thyroid disease in her daughter.   Review of Systems: Please see the history of present illness.   Otherwise, the review of systems is positive for none.   All other systems are reviewed and negative.   Physical Exam: VS:  BP 122/74   Pulse 81   Ht 5\' 5"  (1.651 m)   Wt 240 lb (108.9 kg)   SpO2 98%   BMI 39.94 kg/m  .  BMI Body mass index is 39.94 kg/m.  Wt Readings from Last 3 Encounters:  02/05/18 240 lb (108.9 kg)  01/02/18 235 lb 9.6 oz (106.9 kg)  11/28/17 236 lb (107 kg)    General: Pleasant. Obese. Alert and in no acute distress.  Weight is up 5 pounds.  HEENT: Normal.  Neck: Supple, no JVD, carotid bruits, or masses noted.  Cardiac: Regular rate and rhythm. No murmurs, rubs, or gallops. +1 edema.  Respiratory:  Lungs are clear to auscultation bilaterally with normal work of breathing.  GI: Soft and nontender.  MS: No deformity or atrophy. Gait and ROM intact.  Skin: Warm and dry. Color is normal.  Neuro:  Strength and sensation are intact and no gross focal deficits noted.    Psych: Alert, appropriate and with normal affect.   LABORATORY DATA:  EKG:  EKG is not ordered today.  Lab Results  Component Value Date   WBC 8.7 11/28/2017   HGB 9.7 (L) 11/28/2017   HCT 32.2 (L) 11/28/2017   PLT 370 11/28/2017   GLUCOSE 274 (H) 11/28/2017   CHOL 274 (H) 12/02/2017   TRIG 120 12/02/2017   HDL 65 12/02/2017   LDLDIRECT 172 (H) 01/24/2017   LDLCALC 185 (H) 12/02/2017   ALT 12 11/28/2017   AST 10 11/28/2017   NA 136 11/28/2017   K 4.7 11/28/2017   CL 99 11/28/2017   CREATININE 0.86 11/28/2017   BUN 24 11/28/2017   CO2 22 11/28/2017   TSH 2.160 11/28/2017   INR 1.06 08/14/2011   HGBA1C 9.8 (A) 11/28/2017     BNP (last 3 results) Recent Labs    07/28/17 1355  BNP 31.0    ProBNP (last 3 results) Recent Labs    10/01/17 1506  PROBNP 85     Other Studies Reviewed Today:  Echo Study Conclusions 09/2017  - Left ventricle: The cavity size was mildly dilated. Wall thickness was normal. Systolic function was normal. The estimated ejection fraction was in the range of 55% to 60%. Wall motion was normal; there were no regional wall motion abnormalities. Doppler parameters are consistent with abnormal left ventricular relaxation (grade 1 diastolic dysfunction). Doppler parameters are consistent with high ventricular filling pressure. - Mitral valve: Calcified annulus. Mildly thickened leaflets .  Impressions:  - Normal LV systolic function; mild LVE; mild diastolic dysfunction.  Cardiac TelemetryStudy Highlights11/2018  NSR Average HR 70  No significant arrhythmias even with palpitations   MyoviewStudy Highlights 06/2016   There was no ST segment deviation noted during stress.  Nuclear stress EF: 59%.  The study is normal.  This is a low risk study.  The left ventricular ejection fraction is normal (55-65%).    EchoStudy Conclusions8/2017  - Left ventricle: The cavity size was normal. There was  mild concentric hypertrophy. Systolic function was normal. The estimated ejection fraction was in the range of 55% to 60%. There is hypokinesis of the basalinferior myocardium. Doppler parameters are consistent with abnormal left ventricular relaxation (grade 1 diastolic dysfunction).  Assessment/Plan:  1.Shortness of breath/weight gain/swelling - most likely diastolic HF but also more anemic - check lab today. Will leave her on 40 mg of Lasix for 3 days, then back to PRN.   2. PAF - in sinus - intolerant to flecainide- in the past her palpitations did notcorrelate with PAF on event monitor- she remains in sinus. She has declined an ablation. She remains on anticoagulation.   3. High risk medicine - lab today  4. Anemia - prior gastric ectasia ablation - checking lab  5. Uncontrolled DM  6. HLD - has not filled her Repatha - will discuss on return.   7. HTN - BP ok here today. No changes made.   Current medicines are reviewed with the patient today.  The patient does not have concerns regarding medicines other than what has been noted above.  The following changes have been made:  See above.  Labs/ tests ordered today include:    Orders Placed This Encounter  Procedures  . Basic metabolic panel  . CBC  . Pro b natriuretic peptide (BNP)     Disposition:   Further disposition to follow. She may need to see GI.   Patient is agreeable to this plan and will call if any problems develop in the interim.   SignedTruitt Merle, NP  02/05/2018 3:08 PM  [facility] [address] [place], [place]  [address] Phone: [phone] Fax: [phone]

## 2018-02-04 NOTE — Telephone Encounter (Signed)
Agree 

## 2018-02-04 NOTE — Telephone Encounter (Signed)
Spoke with the patient, she has had SOB that started on Saturday. When the SOB occurs the patient needs to sit down a rest a few minutes. On Sunday, she walked from her office elevator to her car and had to take time to rest in her car to catch her breath. She denies dizziness and chest pain. The patient usually weighs 233-234, but she weighs 242. She has swelling in her feet, legs and ankles. The patient is not on a diuretic because how several episodes of gout. The patient had an old prescription and took 20 mg of lasix. Spoke with Truitt Merle, NP she said the patient can take an additional lasix to get to 40 mg and follow up with our office tomorrow for her appointment. The patient agreed and had no further questions. Advised the patient if her symptoms worsen or become constant to seek medical attention.

## 2018-02-04 NOTE — Telephone Encounter (Signed)
  Pt c/o swelling: STAT is pt has developed SOB within 24 hours  1) How much weight have you gained and in what time span? About 5 within last week  2) If swelling, where is the swelling located? Legs, ankles, feet  3) Are you currently taking a fluid pill? Not currently but took one this morning  4) Are you currently SOB? Not right now but when she moves around she does have SOB  5) Do you have a log of your daily weights (if so, list)? no  6) Have you gained 3 pounds in a day or 5 pounds in a week? no  7) Have you traveled recently? no

## 2018-02-05 ENCOUNTER — Ambulatory Visit: Payer: Medicare Other | Admitting: Nurse Practitioner

## 2018-02-05 ENCOUNTER — Encounter: Payer: Self-pay | Admitting: Nurse Practitioner

## 2018-02-05 VITALS — BP 122/74 | HR 81 | Ht 65.0 in | Wt 240.0 lb

## 2018-02-05 DIAGNOSIS — I5189 Other ill-defined heart diseases: Secondary | ICD-10-CM | POA: Diagnosis not present

## 2018-02-05 NOTE — Patient Instructions (Addendum)
We will be checking the following labs today - BMET, CBC, BNP   Medication Instructions:    Continue with your current medicines. BUT  Take 40 mg of Lasix for the next 3 days - then back to as needed   If you need a refill on your cardiac medications before your next appointment, please call your pharmacy.     Testing/Procedures To Be Arranged:  N/A  Follow-Up:   Let's see how your labs turn out - we may need to send you to Dr. Carlean Purl - the stomach doctor.     At [facility], you and your health needs are our priority.  As part of our continuing mission to provide you with exceptional heart care, we have created designated Provider Care Teams.  These Care Teams include your primary Cardiologist (physician) and Advanced Practice Providers (APPs -  Physician Assistants and Nurse Practitioners) who all work together to provide you with the care you need, when you need it.  Special Instructions:  . None  Call the [place] office at [phone] if you have any questions, problems or concerns.

## 2018-02-06 LAB — BASIC METABOLIC PANEL
BUN/Creatinine Ratio: 15 (ref 12–28)
BUN: 20 mg/dL (ref 8–27)
CO2: 21 mmol/L (ref 20–29)
Calcium: 9.8 mg/dL (ref 8.7–10.3)
Chloride: 97 mmol/L (ref 96–106)
Creatinine, Ser: 1.35 mg/dL — ABNORMAL HIGH (ref 0.57–1.00)
GFR calc Af Amer: 44 mL/min/{1.73_m2} — ABNORMAL LOW (ref >59)
GFR calc non Af Amer: 38 mL/min/{1.73_m2} — ABNORMAL LOW (ref >59)
Glucose: 287 mg/dL — ABNORMAL HIGH (ref 65–99)
Potassium: 4.1 mmol/L (ref 3.5–5.2)
Sodium: 140 mmol/L (ref 134–144)

## 2018-02-06 LAB — CBC
Hematocrit: 30.7 % — ABNORMAL LOW (ref 34.0–46.6)
Hemoglobin: 9.1 g/dL — ABNORMAL LOW (ref 11.1–15.9)
MCH: 23.7 pg — ABNORMAL LOW (ref 26.6–33.0)
MCHC: 29.6 g/dL — ABNORMAL LOW (ref 31.5–35.7)
MCV: 80 fL (ref 79–97)
Platelets: 371 10*3/uL (ref 150–450)
RBC: 3.84 x10E6/uL (ref 3.77–5.28)
RDW: 17.6 % — ABNORMAL HIGH (ref 12.3–15.4)
WBC: 8 10*3/uL (ref 3.4–10.8)

## 2018-02-06 LAB — PRO B NATRIURETIC PEPTIDE: NT-Pro BNP: 247 pg/mL (ref 0–738)

## 2018-02-10 ENCOUNTER — Encounter: Payer: Self-pay | Admitting: Physician Assistant

## 2018-02-10 ENCOUNTER — Encounter: Payer: Self-pay | Admitting: Gastroenterology

## 2018-02-11 ENCOUNTER — Ambulatory Visit: Payer: Medicare Other | Admitting: Nurse Practitioner

## 2018-02-11 ENCOUNTER — Other Ambulatory Visit: Payer: Medicare Other

## 2018-02-21 ENCOUNTER — Telehealth: Payer: Self-pay

## 2018-02-21 NOTE — Telephone Encounter (Signed)
Will submit Safety Net Application.

## 2018-02-21 NOTE — Telephone Encounter (Signed)
Called pt to schedule labs but pt informed me that she cannot afford med

## 2018-02-24 ENCOUNTER — Other Ambulatory Visit: Payer: Self-pay | Admitting: Family Medicine

## 2018-02-24 DIAGNOSIS — E039 Hypothyroidism, unspecified: Secondary | ICD-10-CM

## 2018-03-03 ENCOUNTER — Other Ambulatory Visit: Payer: Self-pay

## 2018-03-03 DIAGNOSIS — E1122 Type 2 diabetes mellitus with diabetic chronic kidney disease: Secondary | ICD-10-CM

## 2018-03-03 MED ORDER — LIRAGLUTIDE 18 MG/3ML ~~LOC~~ SOPN: 1.8000 mg | PEN_INJECTOR | Freq: Every day | SUBCUTANEOUS | 3 refills | Status: DC

## 2018-03-05 ENCOUNTER — Ambulatory Visit: Payer: Medicare Other | Admitting: Physician Assistant

## 2018-03-05 LAB — HM DIABETES EYE EXAM

## 2018-03-07 ENCOUNTER — Encounter: Payer: Self-pay | Admitting: Family Medicine

## 2018-03-07 DIAGNOSIS — E113399 Type 2 diabetes mellitus with moderate nonproliferative diabetic retinopathy without macular edema, unspecified eye: Secondary | ICD-10-CM | POA: Insufficient documentation

## 2018-03-13 ENCOUNTER — Other Ambulatory Visit (INDEPENDENT_AMBULATORY_CARE_PROVIDER_SITE_OTHER): Payer: Medicare Other

## 2018-03-13 ENCOUNTER — Encounter: Payer: Self-pay | Admitting: Physician Assistant

## 2018-03-13 ENCOUNTER — Ambulatory Visit: Payer: Medicare Other | Admitting: Physician Assistant

## 2018-03-13 ENCOUNTER — Telehealth: Payer: Self-pay | Admitting: *Deleted

## 2018-03-13 VITALS — BP 130/68 | HR 72 | Ht 65.0 in | Wt 239.6 lb

## 2018-03-13 DIAGNOSIS — R195 Other fecal abnormalities: Secondary | ICD-10-CM

## 2018-03-13 DIAGNOSIS — D509 Iron deficiency anemia, unspecified: Secondary | ICD-10-CM

## 2018-03-13 DIAGNOSIS — K31819 Angiodysplasia of stomach and duodenum without bleeding: Secondary | ICD-10-CM

## 2018-03-13 DIAGNOSIS — Z7901 Long term (current) use of anticoagulants: Secondary | ICD-10-CM

## 2018-03-13 MED ORDER — INTEGRA PLUS PO CAPS: ORAL_CAPSULE | ORAL | 3 refills | Status: DC

## 2018-03-13 NOTE — Progress Notes (Signed)
Subjective:    Patient ID: Courtney Pena, female    DOB: [birth year], 78 y.o.   MRN: [id number]  HPI Courtney Pena is a pleasant 78 year old African-American female, known to Dr. Silverio Decamp.  She is referred back today by cardiology/Lori Pia Mau, NP for evaluation of gradually progressive anemia.  Patient is also iron deficient with a ferritin of 12 in October 2019.  Most recent hemoglobin 9.1 hematocrit of 30.7 MCV of 80 in December 2019.  Review of her labs show hemoglobin of 9.7 in June 2019, hemoglobin 10.9 in October 2018.  Patient has not been aware of any melena or hematochezia but says her stools are occasionally dark.  She is chronically anticoagulated on Xarelto.  She has no complaints of abdominal discomfort, no heartburn or indigestion no nausea or vomiting.  Bowels have been normal for her.  She did just recently start on an over-the-counter iron supplement which is a 25 mg iron preparation.  She says she was unable to take ferrous sulfate due to constipation and stomach upset. 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  She had another endoscopy done in 2017 without any evidence of gastric angioma ectasia. Last colonoscopy was done in April 2012 with moderate diverticulosis a small cecal polyp removed which was benign polypoid mucosa.  Other medical issues include atrial fibrillation, congestive heart failure with EF of 55%, hypothyroidism, adult onset diabetes mellitus, chronic kidney disease and obesity.  Review of Systems Pertinent positive and negative review of systems were noted in the above HPI section.  All other review of systems was otherwise negative.  Outpatient Encounter Medications as of 03/13/2018  Medication Sig  . acetaminophen (TYLENOL) 650 MG CR tablet Take 650 mg by mouth every 8 (eight) hours as needed for pain.  Marland Kitchen amLODipine (NORVASC) 5 MG tablet Take 1 tablet (5 mg total) by mouth daily.  . Cholecalciferol (VITAMIN D3) 2000  units TABS Take 2 capsules by mouth daily.   . Coenzyme Q10 (COQ10) 100 MG CAPS Take 100 mEq by mouth daily.   . colchicine 0.6 MG tablet Take 0.6 mg by mouth as needed (gout flare ups).  . diltiazem (CARDIZEM CD) 360 MG 24 hr capsule TAKE 1 CAPSULE BY MOUTH  DAILY  . diltiazem (CARDIZEM) 30 MG tablet Take 1 tablet (30 mg total) by mouth every 4 (four) hours as needed (elevated HR as long as BP > 100).  . Evolocumab (REPATHA SURECLICK) 662 MG/ML SOAJ Inject 1 pen into the skin every 14 (fourteen) days.  . furosemide (LASIX) 20 MG tablet Take 20 mg by mouth daily.  Marland Kitchen glipiZIDE (GLUCOTROL XL) 10 MG 24 hr tablet Take 1 tablet (10 mg total) by mouth 2 (two) times daily.  . Lancets (ONETOUCH ULTRASOFT) lancets Use to check blood sugar daily.  Marland Kitchen levothyroxine (SYNTHROID, LEVOTHROID) 112 MCG tablet TAKE 1 TABLET BY MOUTH  DAILY  . liraglutide (VICTOZA) 18 MG/3ML SOPN Inject 0.3 mLs (1.8 mg total) into the skin daily.  . meclizine (ANTIVERT) 25 MG tablet Take 25 mg by mouth 3 (three) times daily as needed for dizziness or nausea.  . metFORMIN (GLUCOPHAGE-XR) 500 MG 24 hr tablet Take 4 tablets (2,000 mg total) by mouth daily with supper.  . metoprolol succinate (TOPROL-XL) 100 MG 24 hr tablet Take 1 tablet (100 mg total) by mouth 2 (two) times daily. Take with or immediately following a meal.  . Multiple Vitamin (MULTI-VITAMINS) TABS Take daily by mouth.  . nitroGLYCERIN (NITROSTAT)  0.4 MG SL tablet Place 1 tablet (0.4 mg total) under the tongue every 5 (five) minutes as needed for chest pain (Call 911 if chest pain after three doses).  Marland Kitchen omeprazole (PRILOSEC) 40 MG capsule TAKE 1 CAPSULE BY MOUTH  DAILY  . ONE TOUCH ULTRA TEST test strip TEST 3 TIMES DAILY  . rivaroxaban (XARELTO) 20 MG TABS tablet Take 1 tablet (20 mg total) by mouth daily with supper.  . FeFum-FePoly-FA-B Cmp-C-Biot (INTEGRA PLUS) CAPS Take 1 capsule by mouth daily.  . ferrous sulfate (FERROUSUL) 325 (65 FE) MG tablet Take 1 tablet  (325 mg total) by mouth daily with breakfast. (Patient not taking: Reported on 03/13/2018)  . triamcinolone cream (KENALOG) 0.1 % Apply 1 application topically as needed (dermatitis).   No facility-administered encounter medications on file as of 03/13/2018.    Allergies  Allergen Reactions  . Lisinopril Swelling    REACTION: swelling, may have had some breathing involvement.   . Nsaids Other (See Comments)    Patient reports internal bleeding  . Penicillins Shortness Of Breath and Swelling    Arm Swelling with Penicillin (Occurred in 1960s) Breathing - throat swelling with Amoxicillin (Occurred prior to 2002)  . Sulfamethoxazole Hives and Itching    "welps all over" immediately after dose  . Tolmetin Other (See Comments)    Patient reports internal bleeding  . Actos [Pioglitazone] Swelling    REACTION: swelling all over body  . Flecainide Other (See Comments)    Blurry  vision  . Gabapentin Other (See Comments)    Caused dysphoria   . Iodinated Diagnostic Agents Itching    Pt. Developed mild itching after receiving IV cm; pt. Held; Dr. Keane Scrape recomended she take 50 mg of benadryl when she goes home-if necessary; Dr Mickey Farber recommends benadryl prior to future exams requiring contrast media, but stated other doctors may recommend another premedication prep.  . Statins Other (See Comments)    Muscle aches with multiple statins  . Losartan Potassium Other (See Comments)    Lower extremity swelling  . Zetia [Ezetimibe] Other (See Comments)    Cramps   Patient Active Problem List   Diagnosis Date Noted  . Diabetic retinopathy, nonproliferative, moderate ([place]) 03/07/2018  . Headache 11/28/2017  . Diastolic CHF ([place]) 98/33/8250  . Cough 08/15/2017  . Right flank pain 03/13/2017  . Diabetic neuropathy ([place]) 10/17/2016  . Transaminitis 11/10/2015  . Chronic anticoagulation - Xarelto started 07/07/2015, CHADS2CVASC=5 07/07/2015  . Grief 06/24/2015  . Left sided chest pain  05/12/2015  . Subcutaneous cyst 07/09/2014  . Fatigue 06/24/2014  . Chronic gout of multiple sites 03/23/2014  . Degenerative arthritis of thumb 02/12/2014  . Unspecified vitamin D deficiency 09/18/2013  . Hyperlipidemia associated with type 2 diabetes mellitus ([place]) 08/22/2012  . Nummular eczema 10/10/2011  . 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 Anemia due to GI blood loss 08/12/2011  . Pulmonary nodule 12/26/2010  . Paroxysmal atrial fibrillation ([place]) 12/14/2010  . Edema 11/02/2008  . Hypothyroidism 04/25/2006  . Type 2 diabetes mellitus with diabetic chronic kidney disease ([place]) 04/25/2006  . HYPERCHOLESTEROLEMIA 04/25/2006  . Morbid obesity ([place]) 04/25/2006  . HYPERTENSION, BENIGN SYSTEMIC 04/25/2006  . Coronary atherosclerosis 04/25/2006  . Reflux esophagitis 04/25/2006  . DIVERTICULOSIS OF COLON 04/25/2006  . DJD, UNSPECIFIED 04/25/2006  . VERTIGO NOS OR DIZZINESS 04/25/2006  . Sleep apnea 04/25/2006   Social History   Socioeconomic History  . Marital status: Widowed    Spouse name: Iona Beard  . Number of  children: 4  . Years of education: some colle  . Highest education level: Not on file  Occupational History  . Occupation: Games developer: UNEMPLOYED  Social Needs  . Financial resource strain: Not on file  . Food insecurity:    Worry: Not on file    Inability: Not on file  . Transportation needs:    Medical: Not on file    Non-medical: Not on file  Tobacco Use  . Smoking status: Former Smoker    Packs/day: 2.00    Years: 30.00    Pack years: 60.00    Types: Cigarettes    Last attempt to quit: 02/26/1993    Years since quitting: 25.0  . Smokeless tobacco: Never Used  Substance and Sexual Activity  . Alcohol use: No    Alcohol/week: 0.0 standard drinks  . Drug use: No  . Sexual activity: Not Currently  Lifestyle  . Physical activity:    Days per week: Not on file    Minutes per session: Not on file  . Stress: Not on file  Relationships   . Social connections:    Talks on phone: Not on file    Gets together: Not on file    Attends religious service: Not on file    Active member of club or organization: Not on file    Attends meetings of clubs or organizations: Not on file    Relationship status: Not on file  . Intimate partner violence:    Fear of current or ex partner: Not on file    Emotionally abused: Not on file    Physically abused: Not on file    Forced sexual activity: Not on file  Other Topics Concern  . Not on file  Social History Narrative  . Not on file    [name]'s family history includes Colon polyps in her daughter; Diabetes in her daughter and maternal grandfather; Heart disease in her father; Hypertension in her mother; Other in her mother; Thyroid disease in her daughter.      Objective:    Vitals:   03/13/18 1602  BP: 130/68  Pulse: 72    Physical Exam; well-developed older African-American female in no acute distress, pleasant, height 5 foot 5, weight 239, BMI of 39.8.  HEENT; nontraumatic normocephalic EOMI PERRLA sclera anicteric oral mucosa moist, Cardiovascular; regular rate and rhythm with S1-S2, Pulmonary; clear bilaterally, Abdomen ;soft, nontender nondistended bowel sounds are active there is no palpable mass or hepatosplenomegaly, Rectal ;exam, brown stool heme positive.  Extremities ;no clubbing cyanosis or edema she does have significant erythema and swelling of the left great toe and edema across the top of the left foot. Neuro; patient is alert and oriented grossly nonfocal mood and affect appropriate       Assessment & Plan:   #48 78 year old African-American female with chronic anemia, gradually progressive now documented iron deficient. She is heme positive on rectal exam today.  Etiology of her iron deficiency anemia is not entirely clear though she does have prior history of gastric AVMs/GAVE for which she underwent APC in 2013. Rule out recurrent GAVE. Rule  out small bowel AVMs Rule out occult colonic lesion  #2 chronic anticoagulation-on Xarelto 3.  Atrial fibrillation 4.  Congestive heart failure EF 55% 5.  Adult onset diabetes mellitus 6.  Chronic kidney disease 7.  Diverticulosis  Plan; start Integra +1 p.o. daily x4 months. Patient was advised to use MiraLAX on a daily or every other daily basis  as needed for constipation related to iron She will be scheduled for upper endoscopy and colonoscopy with Dr. Silverio Decamp.  Both procedures were discussed in detail with the patient including indications risks and benefits and she is agreeable to proceed. Patient will need to hold Xarelto for 24 to 48 hours prior to procedures.  We will communicate with her cardiologist/Dr. Johnsie Cancel to assure this is reasonable for this patient. Have placed orders for follow-up CBC and iron studies in 1 month.  Yannis Broce Genia Harold PA-C 03/13/2018   Cc: [name], MD

## 2018-03-13 NOTE — Patient Instructions (Addendum)
Come to our lab, basement level the week Feb 17th.   We sent a prescription to [facility], [address], [place], [place].  1. Integra Plus.  Start MKiralax 17 grams in a glass of water every other day or every day if needed for constipation.   We will call you with the Xarelto clearance directions from Dr. Johnsie Cancel.   You have been scheduled for an endoscopy and colonoscopy. Please follow the written instructions given to you at your visit today. Please pick up your prep supplies at the pharmacy within the next 1-3 days. If you use inhalers (even only as needed), please bring them with you on the day of your procedure. Normal BMI (Body Mass Index- based on height and weight) is between 23 and 30. Your BMI today is Body mass index is 39.87 kg/m. Marland Kitchen Please consider follow up  regarding your BMI with your Primary Care Provider.

## 2018-03-13 NOTE — Telephone Encounter (Signed)
[facility] Pre-operative Risk Assessment     Request for surgical clearance:     Endoscopy Procedure  What type of surgery is being performed?     EGD/COLON  When is this surgery scheduled?     03-26-2018  What type of clearance is required ?   Pharmacy  Are there any medications that need to be held prior to surgery and how long? Xarelto- hold 1-2 days  Practice name and name of physician performing surgery?      [facility]  What is your office phone and fax number?      Phone- [phone]  Fax667 [phone]  Anesthesia type (None, local, MAC, general) ?       MAC

## 2018-03-14 LAB — CBC WITH DIFFERENTIAL/PLATELET
Basophils Absolute: 0.1 10*3/uL (ref 0.0–0.1)
Basophils Relative: 1.7 % (ref 0.0–3.0)
Eosinophils Absolute: 0.3 10*3/uL (ref 0.0–0.7)
Eosinophils Relative: 3.2 % (ref 0.0–5.0)
HCT: 29.7 % — ABNORMAL LOW (ref 36.0–46.0)
Hemoglobin: 9.2 g/dL — ABNORMAL LOW (ref 12.0–15.0)
Lymphocytes Relative: 24 % (ref 12.0–46.0)
Lymphs Abs: 1.9 10*3/uL (ref 0.7–4.0)
MCHC: 31.1 g/dL (ref 30.0–36.0)
MCV: 77 fl — ABNORMAL LOW (ref 78.0–100.0)
Monocytes Absolute: 0.3 10*3/uL (ref 0.1–1.0)
Monocytes Relative: 3.8 % (ref 3.0–12.0)
Neutro Abs: 5.4 10*3/uL (ref 1.4–7.7)
Neutrophils Relative %: 67.3 % (ref 43.0–77.0)
Platelets: 351 10*3/uL (ref 150.0–400.0)
RBC: 3.85 Mil/uL — ABNORMAL LOW (ref 3.87–5.11)
RDW: 18.9 % — ABNORMAL HIGH (ref 11.5–15.5)
WBC: 8 10*3/uL (ref 4.0–10.5)

## 2018-03-14 LAB — IBC PANEL
Iron: 26 ug/dL — ABNORMAL LOW (ref 42–145)
Saturation Ratios: 5.4 % — ABNORMAL LOW (ref 20.0–50.0)
Transferrin: 341 mg/dL (ref 212.0–360.0)

## 2018-03-14 NOTE — Telephone Encounter (Signed)
Patient with diagnosis of pAF on xarelto for anticoagulation.    Procedure: EGD/COLON Date of procedure: 03/26/2018  CHADS2-VASc score of  6 (CHF, HTN, AGE, DM2, stroke/tia x 2, CAD, AGE, female)  Per office protocol, patient can hold xarelto for 1 day prior to procedure.

## 2018-03-14 NOTE — Telephone Encounter (Signed)
   Primary Cardiologist: [name], MD  Chart reviewed as part of pre-operative protocol coverage.   Per pharmacy recommendations, patient can hold xarelto 1 day prior to his scheduled endoscopy on 03/26/18.   I will route this recommendation to the requesting party via Epic fax function and remove from pre-op pool.  Please call with questions.  Abigail Butts, PA-C 03/14/2018, 4:36 PM

## 2018-03-17 ENCOUNTER — Telehealth: Payer: Self-pay | Admitting: *Deleted

## 2018-03-17 LAB — FERRITIN: Ferritin: 10.1 ng/mL (ref 10.0–291.0)

## 2018-03-17 NOTE — Telephone Encounter (Signed)
Called the patient to advise that  Per Dr. Johnsie Cancel and his anticoagulation team, she can hold the Xarelto on 1-28 and 03-26-18.  The day for her to take th Xarelto is 03-24-18. I advised Dr. Silverio Decamp will discuss with her when to resume the Xarelto. The patient verbalized understanding the isntructions.

## 2018-03-17 NOTE — Telephone Encounter (Signed)
Called the patient and she advised me that the Integra Plus iron that Amy prescribed has been hurting her stomach. Amy said that she can take the iron supplement that she was taking, the 25 mg tablet. Amy wants her to take 1 tablet 3 times a day with food. I asked her to call us if she has a problem with that. She thanked me for calling her back.

## 2018-03-20 NOTE — Progress Notes (Signed)
Reviewed and agree with documentation and assessment and plan. K. Veena Robbin Loughmiller , MD   

## 2018-03-21 ENCOUNTER — Telehealth: Payer: Self-pay | Admitting: Pharmacist

## 2018-03-21 NOTE — Telephone Encounter (Signed)
Pt was denied from [place] patient assistance through the [employer]. Copay at her pharmacy will be $150 the first month due to deductible, then $100 per month after. LMOM with pt - will see if this is affordable for her. If not, will try bempedoic acid once it is FDA approved next month.

## 2018-03-25 ENCOUNTER — Telehealth: Payer: Self-pay | Admitting: Gastroenterology

## 2018-03-25 NOTE — Telephone Encounter (Signed)
Spoke with pt - Repatha copay is unaffordable. She already takes 2 brand medications and would have trouble affording another high copay. Provided her with # for [facility] who should still be taking applications for lipid medication support. She will call back with an update. Unfortunately, options are limited as bempedoic acid would be too expensive for pt and she does not qualify for any clinical trials. She is already intolerant to rosuvastatin 5mg  once weekly, multiple doses of pravastatin, and Zetia.

## 2018-03-25 NOTE — Telephone Encounter (Signed)
Beth, please check what questions patient has regarding medication

## 2018-03-25 NOTE — Telephone Encounter (Signed)
Confirmed to stop Xarelto today. Do not take tomorrow. Do not take diabetic medication on the day of the procedure. Hold Victoza day of the procedure.

## 2018-03-26 ENCOUNTER — Encounter: Payer: Self-pay | Admitting: Gastroenterology

## 2018-03-26 ENCOUNTER — Ambulatory Visit (AMBULATORY_SURGERY_CENTER): Payer: Medicare Other | Admitting: Gastroenterology

## 2018-03-26 VITALS — BP 136/77 | HR 67 | Temp 97.1°F | Resp 11 | Ht 65.0 in | Wt 239.0 lb

## 2018-03-26 DIAGNOSIS — K649 Unspecified hemorrhoids: Secondary | ICD-10-CM | POA: Diagnosis not present

## 2018-03-26 DIAGNOSIS — K5730 Diverticulosis of large intestine without perforation or abscess without bleeding: Secondary | ICD-10-CM | POA: Diagnosis not present

## 2018-03-26 DIAGNOSIS — D122 Benign neoplasm of ascending colon: Secondary | ICD-10-CM | POA: Diagnosis not present

## 2018-03-26 DIAGNOSIS — D509 Iron deficiency anemia, unspecified: Secondary | ICD-10-CM | POA: Diagnosis not present

## 2018-03-26 DIAGNOSIS — D123 Benign neoplasm of transverse colon: Secondary | ICD-10-CM

## 2018-03-26 DIAGNOSIS — R195 Other fecal abnormalities: Secondary | ICD-10-CM

## 2018-03-26 MED ORDER — SODIUM CHLORIDE 0.9 % IV SOLN: 500.0000 mL | Freq: Once | INTRAVENOUS | Status: DC

## 2018-03-26 NOTE — Op Note (Signed)
[place] Patient Name: Courtney Pena Procedure Date: 03/26/2018 2:08 PM MRN: [id number] Endoscopist: Mauri Pole , MD Age: 78 Referring MD:  Date of Birth: 28-Aug-1940 Gender: Female Account #: [id number] Procedure:                Colonoscopy Indications:              Unexplained iron deficiency anemia Medicines:                Monitored Anesthesia Care Procedure:                Pre-Anesthesia Assessment:                           - Prior to the procedure, a History and Physical                            was performed, and patient medications and                            allergies were reviewed. The patient's tolerance of                            previous anesthesia was also reviewed. The risks                            and benefits of the procedure and the sedation                            options and risks were discussed with the patient.                            All questions were answered, and informed consent                            was obtained. Prior Anticoagulants: The patient                            last took Xarelto (rivaroxaban) 2 days prior to the                            procedure. ASA Grade Assessment: III - A patient                            with severe systemic disease. After reviewing the                            risks and benefits, the patient was deemed in                            satisfactory condition to undergo the procedure.                           After obtaining informed consent, the colonoscope  was passed under direct vision. Throughout the                            procedure, the patient's blood pressure, pulse, and                            oxygen saturations were monitored continuously. The                            Model PCF-H190DL [id number]) scope was introduced                            through the anus and advanced to the the cecum,                             identified by appendiceal orifice and ileocecal                            valve. The colonoscopy was performed without                            difficulty. The patient tolerated the procedure                            well. The quality of the bowel preparation was                            good. The ileocecal valve, appendiceal orifice, and                            rectum were photographed. Scope In: 2:21:24 PM Scope Out: 2:37:43 PM Scope Withdrawal Time: 0 hours 10 minutes 8 seconds  Total Procedure Duration: 0 hours 16 minutes 19 seconds  Findings:                 The perianal and digital rectal examinations were                            normal.                           Three sessile polyps were found in the transverse                            colon and ascending colon. The polyps were 5 to 8                            mm in size. These polyps were removed with a cold                            snare. Resection and retrieval were complete.                           Scattered small and large-mouthed diverticula were  found in the sigmoid colon and descending colon.                           Non-bleeding internal hemorrhoids were found during                            retroflexion. The hemorrhoids were small. Complications:            No immediate complications. Estimated Blood Loss:     Estimated blood loss was minimal. Impression:               - Three 5 to 8 mm polyps in the transverse colon                            and in the ascending colon, removed with a cold                            snare. Resected and retrieved.                           - Diverticulosis in the sigmoid colon and in the                            descending colon.                           - Non-bleeding internal hemorrhoids. Recommendation:           - Patient has a contact number available for                            emergencies. The signs and symptoms of potential                             delayed complications were discussed with the                            patient. Return to normal activities tomorrow.                            Written discharge instructions were provided to the                            patient.                           - Resume previous diet.                           - Continue present medications.                           - Resume Xarelto (rivaroxaban) at prior dose                            tomorrow. Refer to managing physician for further  adjustment of therapy.                           - Await pathology results.                           - Repeat colonoscopy in 3 - 5 years for                            surveillance based on pathology results.                           - To visualize the small bowel, perform video                            capsule endoscopy at appointment to be scheduled. Mauri Pole, MD 03/26/2018 3:00:07 PM This report has been signed electronically.

## 2018-03-26 NOTE — Op Note (Addendum)
[place] Patient Name: Courtney Pena Procedure Date: 03/26/2018 2:08 PM MRN: [id number] Endoscopist: Mauri Pole , MD Age: 78 Referring MD:  Date of Birth: 02/08/1941 Gender: Female Account #: [id number] Procedure:                Upper GI endoscopy Indications:              Suspected upper gastrointestinal bleeding in                            patient with unexplained iron deficiency anemia Medicines:                Monitored Anesthesia Care Procedure:                Pre-Anesthesia Assessment:                           - Prior to the procedure, a History and Physical                            was performed, and patient medications and                            allergies were reviewed. The patient's tolerance of                            previous anesthesia was also reviewed. The risks                            and benefits of the procedure and the sedation                            options and risks were discussed with the patient.                            All questions were answered, and informed consent                            was obtained. Prior Anticoagulants: The patient                            last took Xarelto (rivaroxaban) 2 days prior to the                            procedure. ASA Grade Assessment: III - A patient                            with severe systemic disease. After reviewing the                            risks and benefits, the patient was deemed in                            satisfactory condition to undergo the procedure.  After obtaining informed consent, the endoscope was                            passed under direct vision. Throughout the                            procedure, the patient's blood pressure, pulse, and                            oxygen saturations were monitored continuously. The                            Endoscope was introduced through the mouth, and         advanced to the second part of duodenum. The upper                            GI endoscopy was accomplished without difficulty.                            The patient tolerated the procedure well. Scope In: Scope Out: Findings:                 The esophagus was normal.                           The stomach was normal.                           The examined duodenum was normal. Complications:            No immediate complications. Estimated Blood Loss:     Estimated blood loss: none. Impression:               - Normal esophagus.                           - Normal stomach.                           - Normal examined duodenum.                           - No specimens collected. Recommendation:           - Patient has a contact number available for                            emergencies. The signs and symptoms of potential                            delayed complications were discussed with the                            patient. Return to normal activities tomorrow.                            Written discharge instructions were provided to the  patient.                           - Resume previous diet.                           - Continue present medications.                           - See the other procedure note for documentation of                            additional recommendations. Mauri Pole, MD 03/26/2018 2:55:32 PM This report has been signed electronically.

## 2018-03-26 NOTE — Progress Notes (Signed)
Report to PACU, RN, vss, BBS= Clear.  

## 2018-03-26 NOTE — Patient Instructions (Signed)
[place] (03/27/2018)-REFER TO MANAGING PHYSICIAN FOR FURTHER ADJUSTMENTS   INFORMATION ON POLYPS ,DIVERTICULOSIS,& HEMORRHOIDS GIVEN TO YOU TODAY  AWAIT PATHOLOGY RESULTS ON POLYPS REMOVED TODAY   YOU HAD AN ENDOSCOPIC PROCEDURE TODAY AT [facility]:   Refer to the procedure report that was given to you for any specific questions about what was found during the examination.  If the procedure report does not answer your questions, please call your gastroenterologist to clarify.  If you requested that your care partner not be given the details of your procedure findings, then the procedure report has been included in a sealed envelope for you to review at your convenience later.  YOU SHOULD EXPECT: Some feelings of bloating in the abdomen. Passage of more gas than usual.  Walking can help get rid of the air that was put into your GI tract during the procedure and reduce the bloating. If you had a lower endoscopy (such as a colonoscopy or flexible sigmoidoscopy) you may notice spotting of blood in your stool or on the toilet paper. If you underwent a bowel prep for your procedure, you may not have a normal bowel movement for a few days.  Please Note:  You might notice some irritation and congestion in your nose or some drainage.  This is from the oxygen used during your procedure.  There is no need for concern and it should clear up in a day or so.  SYMPTOMS TO REPORT IMMEDIATELY:   Following lower endoscopy (colonoscopy or flexible sigmoidoscopy):  Excessive amounts of blood in the stool  Significant tenderness or worsening of abdominal pains  Swelling of the abdomen that is new, acute  Fever of 100F or higher   Following upper endoscopy (EGD)  Vomiting of blood or coffee ground material  New chest pain or pain under the shoulder blades  Painful or persistently difficult swallowing  New shortness of breath  Fever of 100F or  higher  Black, tarry-looking stools  For urgent or emergent issues, a gastroenterologist can be reached at any hour by calling [phone].   DIET:  We do recommend a small meal at first, but then you may proceed to your regular diet.  Drink plenty of fluids but you should avoid alcoholic beverages for 24 hours.  ACTIVITY:  You should plan to take it easy for the rest of today and you should NOT DRIVE or use heavy machinery until tomorrow (because of the sedation medicines used during the test).    FOLLOW UP: Our staff will call the number listed on your records the next business day following your procedure to check on you and address any questions or concerns that you may have regarding the information given to you following your procedure. If we do not reach you, we will leave a message.  However, if you are feeling well and you are not experiencing any problems, there is no need to return our call.  We will assume that you have returned to your regular daily activities without incident.  If any biopsies were taken you will be contacted by phone or by letter within the next 1-3 weeks.  Please call us at [phone] if you have not heard about the biopsies in 3 weeks.    SIGNATURES/CONFIDENTIALITY: You and/or your care partner have signed paperwork which will be entered into your electronic medical record.  These signatures attest to the fact that that the information above on your After  Visit Summary has been reviewed and is understood.  Full responsibility of the confidentiality of this discharge information lies with you and/or your care-partner.

## 2018-03-26 NOTE — Progress Notes (Signed)
Called to room to assist during endoscopic procedure.  Patient ID and intended procedure confirmed with present staff. Received instructions for my participation in the procedure from the performing physician.  

## 2018-03-27 ENCOUNTER — Telehealth: Payer: Self-pay | Admitting: *Deleted

## 2018-03-27 NOTE — Telephone Encounter (Signed)
  Follow up Call-  Call back number 03/26/2018 07/05/2015  Post procedure Call Back phone  # [phone]  Permission to leave phone message Yes No  Some recent data might be hidden     Patient questions:  Do you have a fever, pain , or abdominal swelling? No. Pain Score  0 *  Have you tolerated food without any problems? Yes.    Have you been able to return to your normal activities? Yes.    Do you have any questions about your discharge instructions: Diet   No. Medications  No. Follow up visit  No.  Do you have questions or concerns about your Care? No.  Actions: * If pain score is 4 or above: No action needed, pain <4.

## 2018-03-31 ENCOUNTER — Encounter: Payer: Self-pay | Admitting: Gastroenterology

## 2018-04-02 ENCOUNTER — Other Ambulatory Visit: Payer: Self-pay

## 2018-04-02 DIAGNOSIS — D509 Iron deficiency anemia, unspecified: Secondary | ICD-10-CM

## 2018-04-14 ENCOUNTER — Other Ambulatory Visit: Payer: Self-pay | Admitting: Nurse Practitioner

## 2018-04-14 ENCOUNTER — Telehealth: Payer: Self-pay | Admitting: Gastroenterology

## 2018-04-14 ENCOUNTER — Ambulatory Visit: Payer: Medicare Other | Admitting: Nurse Practitioner

## 2018-04-14 ENCOUNTER — Encounter: Payer: Self-pay | Admitting: Nurse Practitioner

## 2018-04-14 VITALS — BP 128/80 | HR 92 | Ht 65.5 in | Wt 237.8 lb

## 2018-04-14 DIAGNOSIS — M79604 Pain in right leg: Secondary | ICD-10-CM

## 2018-04-14 DIAGNOSIS — I739 Peripheral vascular disease, unspecified: Secondary | ICD-10-CM

## 2018-04-14 DIAGNOSIS — R0989 Other specified symptoms and signs involving the circulatory and respiratory systems: Secondary | ICD-10-CM

## 2018-04-14 DIAGNOSIS — I5189 Other ill-defined heart diseases: Secondary | ICD-10-CM | POA: Diagnosis not present

## 2018-04-14 DIAGNOSIS — Z7901 Long term (current) use of anticoagulants: Secondary | ICD-10-CM | POA: Diagnosis not present

## 2018-04-14 DIAGNOSIS — R079 Chest pain, unspecified: Secondary | ICD-10-CM | POA: Diagnosis not present

## 2018-04-14 DIAGNOSIS — I480 Paroxysmal atrial fibrillation: Secondary | ICD-10-CM

## 2018-04-14 DIAGNOSIS — I2510 Atherosclerotic heart disease of native coronary artery without angina pectoris: Secondary | ICD-10-CM

## 2018-04-14 MED ORDER — NITROGLYCERIN 0.4 MG SL SUBL: 0.4000 mg | SUBLINGUAL_TABLET | SUBLINGUAL | 3 refills | Status: DC | PRN

## 2018-04-14 NOTE — Patient Instructions (Addendum)
We will be checking the following labs today - BMET and CBC   Medication Instructions:    Continue with your current medicines.   I did send in a refill for NTG   If you need a refill on your cardiac medications before your next appointment, please call your pharmacy.     Testing/Procedures To Be Arranged:  Lexiscan Myoview  Lower extremity arterial doppler study  Follow-Up:   Let's see how your tests turn out and then decide about follow up.     At [facility], you and your health needs are our priority.  As part of our continuing mission to provide you with exceptional heart care, we have created designated Provider Care Teams.  These Care Teams include your primary Cardiologist (physician) and Advanced Practice Providers (APPs -  Physician Assistants and Nurse Practitioners) who all work together to provide you with the care you need, when you need it.  Special Instructions:  You are scheduled for a Myocardial Perfusion Imaging Study on _____________________________ at _________________________________.   Please arrive 15 minutes prior to your appointment time for registration and insurance purposes.   The test will take approximately 3 to 4 hours to complete; you may bring reading material. If someone comes with you to your appointment, they will need to remain in the main lobby due to limited space in the testing area.    How to prepare for your Myocardial Perfusion test:   Do not eat or drink 3 hours prior to your test, except you may have water.    Do not consume products containing caffeine (regular or decaffeinated) 12 hours prior to your test (ex: coffee, chocolate, soda, tea)   Do bring a list of your current medications with you. If not listed below, you may take your medications as normal.    Bring any held medication to your appointment, as you may be required to take it once the test is complete.   Do wear comfortable clothes (no dresses or overalls)  and walking shoes. Tennis shoes are preferred. No heels or open toed shoes.  Do not wear cologne, perfume, aftershave or lotions (deodorant is allowed).   If these instructions are not followed, you test will have to be rescheduled.   Please report to [address] for your test. If you have questions or concerns about your appointment, please call the Nuclear Lab at [phone].  If you cannot keep your appointment, please provide 24 hour notification to the Nuclear lab to avoid a possible $50 charge to your account.     Call the [place] office at [phone] if you have any questions, problems or concerns.

## 2018-04-14 NOTE — Telephone Encounter (Signed)
Advised patient that she cannot have an MRI or any magnetic type imaging. Doppler study or ultrasound will not affect the capsule. She states the Vascular office are aware of the Capsule Endoscopy planned for tomorrow before the appointment with them.

## 2018-04-14 NOTE — Progress Notes (Addendum)
CARDIOLOGY OFFICE NOTE  Date:  04/14/2018    [name] Date of Birth: [birth year] Medical Record #[id number]  PCP:  [name], MD  Cardiologist:  Gillian Shields    Chief Complaint  Patient presents with  . Chest Pain  . Hyperlipidemia  . Hypertension  . Atrial Fibrillation    Follow up visit - seen for Dr. Johnsie Cancel    History of Present Illness: Courtney Pena is a 78 y.o. female who presents today for a 2 month check.  Seen for Dr. Johnsie Cancel.  She has a history of HTN, DM, HLD,obesityandOSA. Long history of PAF. On Xarelto -held in past for gastric ectasia that was ablated. She has aCHADS2VASCof at [facility]. Intolerant to flecainidedue tovisual changes.She has seen Dr. Rayann Heman in the past - declined ablation but noted sotalol could be an optionfor her. History of mild CAD noted by remote cath in 2004. Last Myoview dates back to 2018.    Last seen here in [place] by Dr. Johnsie Cancel.  I then saw her in August for a work in for cough and congestion. Was getting ready to travel and wanted "checked out". Echo updated. Round of antibiotics given. Continued to struggle with weight loss. Seen as a work in back in December- weight was up - lots of dietary indiscretion with salt. She was more anemic - not taking her iron regularly. Had never started/filled her Repatha.   Comes in today. [place]. Lots of issues. She has had chest pain over the weekend - thought she should have gone to the ER - she did not. Did not use NTG. She describes it as a sharp "sticking" pain - would come and go - lasted for most of the weekend - would wake her up at night - seemed to be worse with movement.  Her legs hurt - primarily the right leg - seems to be worse with walking but also hurts at rest. She does have neuropathy.    Past Medical History:  Diagnosis Date  . Allergy   . Anemia due to GI blood loss 08/12/2011  . Arthritis   . Atrial flutter ([place])  12-07-2010   converted in ED with 300 mg flecainide  . CAD (coronary artery disease)    a. mild per cath in 2004;  b. nonischemic Myoview in March 2012;  c. Lex MV 1/14:  EF 66%, no ischemia  . Cataract   . Chronic anticoagulation - Xarelto started 07/07/2015, CHADS2CVASC=5 07/07/2015  . Clotting disorder ([place])   . Diastolic CHF ([place]) 1/77/9390  . External hemorrhoids 06/07/2010  . Gastric antral vascular ectasia    source for gi bleed in 07/2011 - Xarelto stopped  . Gastritis   . GERD (gastroesophageal reflux disease)   . Hiatal hernia   . Hyperlipidemia   . Hypertension   . Hypothyroidism   . Neuromuscular disorder ([place])   . Obesity   . Paroxysmal atrial fibrillation ([facility])   . Personal history of colonic polyps 06/06/2009   cecal polyp  . Sleep apnea    does not use CPAP  . 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 Ulcer     Past Surgical History:  Procedure Laterality Date  . BREAST BIOPSY Left   . BREAST EXCISIONAL BIOPSY Left    benign  . CARDIAC CATHETERIZATION  1999&2004  . CARPAL TUNNEL RELEASE Bilateral 2003  . CATARACT EXTRACTION Bilateral   . COLONOSCOPY    .  ESOPHAGOGASTRODUODENOSCOPY  08/13/2011   Procedure: ESOPHAGOGASTRODUODENOSCOPY (EGD);  Surgeon: Gatha Mayer, MD;  [facility]: [facility];  Service: Endoscopy;  Laterality: N/A;  . POLYPECTOMY       Medications: Current Meds  Medication Sig  . acetaminophen (TYLENOL) 650 MG CR tablet Take 650 mg by mouth every 8 (eight) hours as needed for pain.  Marland Kitchen amLODipine (NORVASC) 5 MG tablet Take 1 tablet (5 mg total) by mouth daily.  . Cholecalciferol (VITAMIN D3) 2000 units TABS Take 2 capsules by mouth daily.   . Coenzyme Q10 (COQ10) 100 MG CAPS Take 100 mEq by mouth daily.   . colchicine 0.6 MG tablet Take 0.6 mg by mouth as needed (gout flare ups).  . diltiazem (CARDIZEM CD) 360 MG 24 hr capsule TAKE 1 CAPSULE BY MOUTH  DAILY  . diltiazem (CARDIZEM) 30 MG tablet  Take 1 tablet (30 mg total) by mouth every 4 (four) hours as needed (elevated HR as long as BP > 100).  . furosemide (LASIX) 20 MG tablet Take 20 mg by mouth daily.  Marland Kitchen glipiZIDE (GLUCOTROL XL) 10 MG 24 hr tablet Take 1 tablet (10 mg total) by mouth 2 (two) times daily.  . Lancets (ONETOUCH ULTRASOFT) lancets Use to check blood sugar daily.  Marland Kitchen levothyroxine (SYNTHROID, LEVOTHROID) 112 MCG tablet TAKE 1 TABLET BY MOUTH  DAILY  . liraglutide (VICTOZA) 18 MG/3ML SOPN Inject 0.3 mLs (1.8 mg total) into the skin daily.  . meclizine (ANTIVERT) 25 MG tablet Take 25 mg by mouth 3 (three) times daily as needed for dizziness or nausea.  . metFORMIN (GLUCOPHAGE-XR) 500 MG 24 hr tablet Take 4 tablets (2,000 mg total) by mouth daily with supper.  . metoprolol succinate (TOPROL-XL) 100 MG 24 hr tablet Take 1 tablet (100 mg total) by mouth 2 (two) times daily. Take with or immediately following a meal.  . Multiple Vitamin (MULTI-VITAMINS) TABS Take daily by mouth.  . nitroGLYCERIN (NITROSTAT) 0.4 MG SL tablet Place 1 tablet (0.4 mg total) under the tongue every 5 (five) minutes as needed for chest pain (Call 911 if chest pain after three doses).  Marland Kitchen omeprazole (PRILOSEC) 40 MG capsule TAKE 1 CAPSULE BY MOUTH  DAILY  . ONE TOUCH ULTRA TEST test strip TEST 3 TIMES DAILY  . rivaroxaban (XARELTO) 20 MG TABS tablet Take 1 tablet (20 mg total) by mouth daily with supper.  . triamcinolone cream (KENALOG) 0.1 % Apply 1 application topically as needed (dermatitis).  . [DISCONTINUED] nitroGLYCERIN (NITROSTAT) 0.4 MG SL tablet Place 1 tablet (0.4 mg total) under the tongue every 5 (five) minutes as needed for chest pain (Call 911 if chest pain after three doses).     Allergies: Allergies  Allergen Reactions  . Lisinopril Swelling    REACTION: swelling, may have had some breathing involvement.   . Nsaids Other (See Comments)    Patient reports internal bleeding  . Penicillins Shortness Of Breath and Swelling    Arm  Swelling with Penicillin (Occurred in 1960s) Breathing - throat swelling with Amoxicillin (Occurred prior to 2002)  . Sulfamethoxazole Hives and Itching    "welps all over" immediately after dose  . Tolmetin Other (See Comments)    Patient reports internal bleeding  . Actos [Pioglitazone] Swelling    REACTION: swelling all over body  . Flecainide Other (See Comments)    Blurry  vision  . Gabapentin Other (See Comments)    Caused dysphoria   . Iodinated Diagnostic Agents Itching    Pt. Developed  mild itching after receiving IV cm; pt. Held; Dr. Keane Scrape recomended she take 50 mg of benadryl when she goes home-if necessary; Dr Mickey Farber recommends benadryl prior to future exams requiring contrast media, but stated other doctors may recommend another premedication prep.  . Statins Other (See Comments)    Muscle aches with multiple statins  . Losartan Potassium Other (See Comments)    Lower extremity swelling  . Zetia [Ezetimibe] Other (See Comments)    Cramps    Social History: The patient  reports that she quit smoking about 25 years ago. Her smoking use included cigarettes. She has a 60.00 pack-year smoking history. She has never used smokeless tobacco. She reports that she does not drink alcohol or use drugs.   Family History: The patient's family history includes Colon polyps in her daughter; Diabetes in her daughter and maternal grandfather; Heart disease in her father; Hypertension in her mother; Other in her mother; Thyroid disease in her daughter.   Review of Systems: Please see the history of present illness.   Otherwise, the review of systems is positive for none.   All other systems are reviewed and negative.   Physical Exam: VS:  BP 128/80 (BP Location: Left Arm, Patient Position: Sitting, Cuff Size: Large)   Pulse 92   Ht 5' 5.5" (1.664 m)   Wt 237 lb 12.8 oz (107.9 kg)   BMI 38.97 kg/m  .  BMI Body mass index is 38.97 kg/m.  Wt Readings from Last 3 Encounters:    04/14/18 237 lb 12.8 oz (107.9 kg)  03/26/18 239 lb (108.4 kg)  03/13/18 239 lb 9.6 oz (108.7 kg)    General: Pleasant. Obese. Alert and in no acute distress.   HEENT: Normal.  Neck: Supple, no JVD, carotid bruits, or masses noted.  Cardiac: Regular rate and rhythm. No murmurs, rubs, or gallops. Legs full/fleshy.  Her pulses are diminished.  Respiratory:  Lungs are clear to auscultation bilaterally with normal work of breathing.  GI: Soft and nontender.  MS: No deformity or atrophy. Gait and ROM intact.  Skin: Warm and dry. Color is normal.  Neuro:  Strength and sensation are intact and no gross focal deficits noted.  Psych: Alert, appropriate and with normal affect.   LABORATORY DATA:  EKG:  EKG is ordered today. This shows NSR - chronic inferior Q's - unchanged.   Lab Results  Component Value Date   WBC 8.0 03/13/2018   HGB 9.2 (L) 03/13/2018   HCT 29.7 (L) 03/13/2018   PLT 351.0 03/13/2018   GLUCOSE 287 (H) 02/05/2018   CHOL 274 (H) 12/02/2017   TRIG 120 12/02/2017   HDL 65 12/02/2017   LDLDIRECT 172 (H) 01/24/2017   LDLCALC 185 (H) 12/02/2017   ALT 12 11/28/2017   AST 10 11/28/2017   NA 140 02/05/2018   K 4.1 02/05/2018   CL 97 02/05/2018   CREATININE 1.35 (H) 02/05/2018   BUN 20 02/05/2018   CO2 21 02/05/2018   TSH 2.160 11/28/2017   INR 1.06 08/14/2011   HGBA1C 9.8 (A) 11/28/2017     BNP (last 3 results) Recent Labs    07/28/17 1355  BNP 31.0    ProBNP (last 3 results) Recent Labs    10/01/17 1506 02/05/18 1507  PROBNP 85 247     Other Studies Reviewed Today:  EchoStudy Conclusions8/2019  - Left ventricle: The cavity size was mildly dilated. Wall thickness was normal. Systolic function was normal. The estimated ejection fraction was in the range  of 55% to 60%. Wall motion was normal; there were no regional wall motion abnormalities. Doppler parameters are consistent with abnormal left ventricular relaxation (grade 1  diastolic dysfunction). Doppler parameters are consistent with high ventricular filling pressure. - Mitral valve: Calcified annulus. Mildly thickened leaflets .  Impressions:  - Normal LV systolic function; mild LVE; mild diastolic dysfunction.  Cardiac TelemetryStudy Highlights11/2018  NSR Average HR 70  No significant arrhythmias even with palpitations   MyoviewStudy Highlights 06/2016   There was no ST segment deviation noted during stress.  Nuclear stress EF: 59%.  The study is normal.  This is a low risk study.  The left ventricular ejection fraction is normal (55-65%).     Assessment/Plan:  1.Chest pain - sounds atypical but she has known mild CAD per a remote cath from 16 years ago - multiple CV risk factors - will get her Myoview updated.   2. Leg pain - multiple CV risk factors - will get arterial doppler study.   3. Chronic diastolic HF - reiterated the need for salt restriction/good BP control.   4. PAF - in NSR today - intolerant to Flecainide - managed with CCB and anticoaguation. Cost of her Xarelto may soon be prohibitive. We talked about coumadin - she is not wanting to change at this time.   5.  Anemia with prior gastric ectasia ablation - rechecking lab today - she is to have capsule endoscopy tomorrow.   6. Obesity  7. HLD - needs to get back on therapy - it was previously very cost prohibitive and she is already almost in the doughnut hole for 2020. Will ask the pharmacist to see what options are available.    8. HTN - BP ok today.   Current medicines are reviewed with the patient today.  The patient does not have concerns regarding medicines other than what has been noted above.  The following changes have been made:  See above.  Labs/ tests ordered today include:    Orders Placed This Encounter  Procedures  . Basic metabolic panel  . CBC  . MYOCARDIAL PERFUSION IMAGING  . EKG 12-Lead     Disposition:   Further  disposition pending.    Patient is agreeable to this plan and will call if any problems develop in the interim.   SignedTruitt Merle, NP  04/14/2018 12:04 PM  [facility] [address] [place], [place]  [address] Phone: [phone] Fax: [phone]

## 2018-04-15 ENCOUNTER — Other Ambulatory Visit: Payer: Self-pay | Admitting: Pharmacist

## 2018-04-15 ENCOUNTER — Telehealth (HOSPITAL_COMMUNITY): Payer: Self-pay | Admitting: *Deleted

## 2018-04-15 ENCOUNTER — Ambulatory Visit (INDEPENDENT_AMBULATORY_CARE_PROVIDER_SITE_OTHER): Payer: Medicare Other | Admitting: Gastroenterology

## 2018-04-15 ENCOUNTER — Encounter (HOSPITAL_COMMUNITY): Payer: Medicare Other

## 2018-04-15 ENCOUNTER — Encounter: Payer: Self-pay | Admitting: Gastroenterology

## 2018-04-15 DIAGNOSIS — D509 Iron deficiency anemia, unspecified: Secondary | ICD-10-CM | POA: Diagnosis not present

## 2018-04-15 DIAGNOSIS — K5520 Angiodysplasia of colon without hemorrhage: Secondary | ICD-10-CM

## 2018-04-15 LAB — BASIC METABOLIC PANEL
BUN/Creatinine Ratio: 17 (ref 12–28)
BUN: 22 mg/dL (ref 8–27)
CO2: 20 mmol/L (ref 20–29)
Calcium: 9.6 mg/dL (ref 8.7–10.3)
Chloride: 101 mmol/L (ref 96–106)
Creatinine, Ser: 1.27 mg/dL — ABNORMAL HIGH (ref 0.57–1.00)
GFR calc Af Amer: 47 mL/min/{1.73_m2} — ABNORMAL LOW (ref >59)
GFR calc non Af Amer: 41 mL/min/{1.73_m2} — ABNORMAL LOW (ref >59)
Glucose: 240 mg/dL — ABNORMAL HIGH (ref 65–99)
Potassium: 4.8 mmol/L (ref 3.5–5.2)
Sodium: 136 mmol/L (ref 134–144)

## 2018-04-15 LAB — CBC
Hematocrit: 28.6 % — ABNORMAL LOW (ref 34.0–46.6)
Hemoglobin: 8.7 g/dL — ABNORMAL LOW (ref 11.1–15.9)
MCH: 23.6 pg — ABNORMAL LOW (ref 26.6–33.0)
MCHC: 30.4 g/dL — ABNORMAL LOW (ref 31.5–35.7)
MCV: 78 fL — ABNORMAL LOW (ref 79–97)
Platelets: 341 10*3/uL (ref 150–450)
RBC: 3.69 x10E6/uL — ABNORMAL LOW (ref 3.77–5.28)
RDW: 16.8 % — ABNORMAL HIGH (ref 11.7–15.4)
WBC: 8.7 10*3/uL (ref 3.4–10.8)

## 2018-04-15 MED ORDER — EVOLOCUMAB 140 MG/ML ~~LOC~~ SOAJ: 1.0000 "pen " | SUBCUTANEOUS | 11 refills | Status: DC

## 2018-04-15 NOTE — Telephone Encounter (Signed)
Patient given detailed instructions per Myocardial Perfusion Study Information Sheet for the test on 04/18/18 at 1015. Patient notified to arrive 15 minutes early and that it is imperative to arrive on time for appointment to keep from having the test rescheduled.  If you need to cancel or reschedule your appointment, please call the office within 24 hours of your appointment. . Patient verbalized understanding.Courtney Pena, [name] letter sent

## 2018-04-15 NOTE — Patient Instructions (Signed)

## 2018-04-15 NOTE — Telephone Encounter (Signed)
ok 

## 2018-04-15 NOTE — Progress Notes (Signed)
Patient arrived for capsule endoscopy; Patient tolerated procedure without complications; Patient verbalized understanding of written and verbal instructions;  CAPSULE= [id number] EXPIRES=2019-03-22

## 2018-04-18 ENCOUNTER — Ambulatory Visit (HOSPITAL_COMMUNITY): Payer: Medicare Other

## 2018-04-21 ENCOUNTER — Telehealth: Payer: Self-pay | Admitting: Cardiovascular Disease

## 2018-04-21 NOTE — Telephone Encounter (Signed)
-----   Message from Burtis Junes, NP sent at 04/14/2018 12:39 PM EST ----- She did not mention this. We will encourage her to call them.   Cecille Rubin ----- Message ----- From: Leeroy Bock, [facility] Sent: 04/14/2018  12:17 PM EST To: Burtis Junes, NP  I spoke with her a few weeks ago, her copay is $100 per month at the pharmacy which she stated was unaffordable. She was denied from patient assistance through the manufacturer because her insurance is covering the medication. I provided her with the # for [facility] a few weeks ago since this is the only other organization that is offering some patient assistance. Did she happen to mention if she called them at all?  Thanks, Jinny Blossom ----- Message ----- From: Burtis Junes, NP Sent: 04/14/2018  12:13 PM EST To: Leeroy Bock, RPH  Megan,   Is there anyway to see if Repatha is now more affordable for her? See my note from today.   Thanks lori

## 2018-04-21 NOTE — Telephone Encounter (Signed)
New Message:    Patient calling concerning the up coming appt she has and would like for someone to call her about some instructions. Please call patient back.

## 2018-04-21 NOTE — Telephone Encounter (Signed)
Pt found instructions for lexi.

## 2018-04-21 NOTE — Telephone Encounter (Signed)
S/w pt stated did not know if pt called [facility] stated to many things to keep track of.  Will call office to s/w Megan Supple, Pharm-d and touch base on phone number.

## 2018-04-22 ENCOUNTER — Ambulatory Visit (HOSPITAL_COMMUNITY)
Admission: RE | Admit: 2018-04-22 | Discharge: 2018-04-22 | Disposition: A | Discharge status: Home / Self Care | Payer: Medicare Other | Source: Ambulatory Visit | Attending: Cardiology | Admitting: Cardiology

## 2018-04-22 DIAGNOSIS — R0989 Other specified symptoms and signs involving the circulatory and respiratory systems: Secondary | ICD-10-CM

## 2018-04-22 DIAGNOSIS — I739 Peripheral vascular disease, unspecified: Secondary | ICD-10-CM | POA: Diagnosis present

## 2018-04-23 ENCOUNTER — Ambulatory Visit (HOSPITAL_COMMUNITY): Payer: Medicare Other | Attending: Internal Medicine

## 2018-04-23 DIAGNOSIS — R079 Chest pain, unspecified: Secondary | ICD-10-CM | POA: Diagnosis present

## 2018-04-23 LAB — MYOCARDIAL PERFUSION IMAGING
LV dias vol: 103 mL (ref 46–106)
LV sys vol: 52 mL
Peak HR: 91 {beats}/min
Rest HR: 77 {beats}/min
SDS: 1
SRS: 0
SSS: 1
TID: 1.09

## 2018-04-23 MED ORDER — TECHNETIUM TC 99M TETROFOSMIN IV KIT
32.4000 | PACK | Freq: Once | INTRAVENOUS | Status: AC | PRN
  Administered 2018-04-23: 32.4 via INTRAVENOUS
  Filled 2018-04-23: qty 33

## 2018-04-23 MED ORDER — TECHNETIUM TC 99M TETROFOSMIN IV KIT
10.4000 | PACK | Freq: Once | INTRAVENOUS | Status: AC | PRN
  Administered 2018-04-23: 10.4 via INTRAVENOUS
  Filled 2018-04-23: qty 11

## 2018-04-23 MED ORDER — REGADENOSON 0.4 MG/5ML IV SOLN
0.4000 mg | Freq: Once | INTRAVENOUS | Status: AC
  Administered 2018-04-23: 0.4 mg via INTRAVENOUS

## 2018-04-24 ENCOUNTER — Other Ambulatory Visit: Payer: Self-pay | Admitting: Family Medicine

## 2018-04-24 ENCOUNTER — Ambulatory Visit: Payer: Medicare Other | Admitting: Family Medicine

## 2018-04-24 DIAGNOSIS — Z1231 Encounter for screening mammogram for malignant neoplasm of breast: Secondary | ICD-10-CM

## 2018-04-27 ENCOUNTER — Other Ambulatory Visit: Payer: Self-pay | Admitting: Family Medicine

## 2018-04-27 ENCOUNTER — Other Ambulatory Visit: Payer: Self-pay | Admitting: Internal Medicine

## 2018-04-28 ENCOUNTER — Ambulatory Visit: Payer: Medicare Other

## 2018-04-28 NOTE — Telephone Encounter (Signed)
Refills per PCP.  She was lost for follow-up.

## 2018-04-28 NOTE — Telephone Encounter (Signed)
Last OV 10/03/16 refill or refuse please advise

## 2018-04-30 ENCOUNTER — Other Ambulatory Visit: Payer: Self-pay | Admitting: Family Medicine

## 2018-04-30 DIAGNOSIS — E039 Hypothyroidism, unspecified: Secondary | ICD-10-CM

## 2018-05-01 IMAGING — CR DG CHEST 2V
2 series · 2 of 2 positions shown · non-contrast
Comparison: Chest x-ray of May 26, 2015 and chest CT scan of
December 29, 2015 and PA and lateral chest x-ray August 12, 2011..

CLINICAL DATA: Left-sided chest pain. History of asthma,
hypertension, atrial fibrillation, diabetes, former smoker.

EXAM:
CHEST  2 VIEW

[w chest pa]
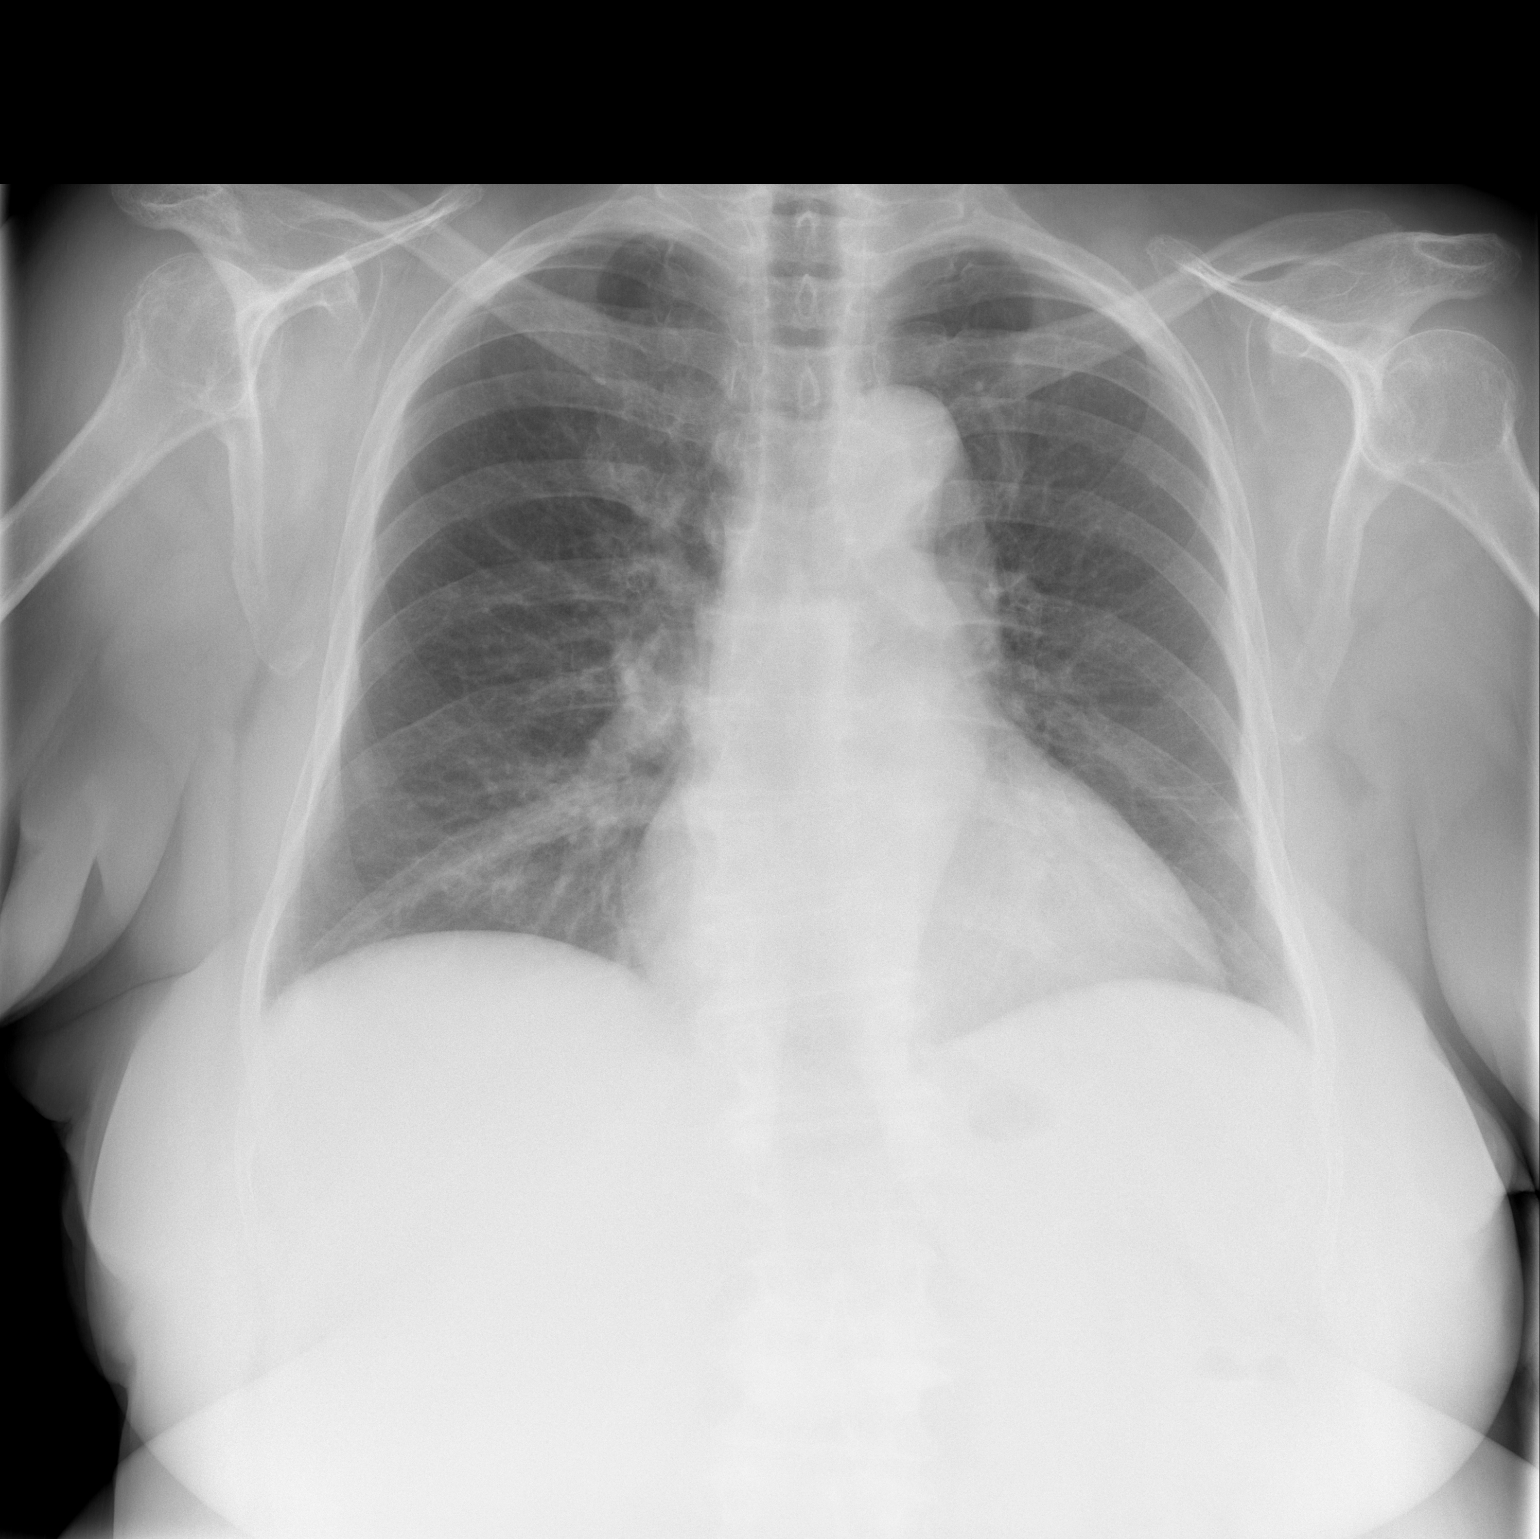

[w chest lat]
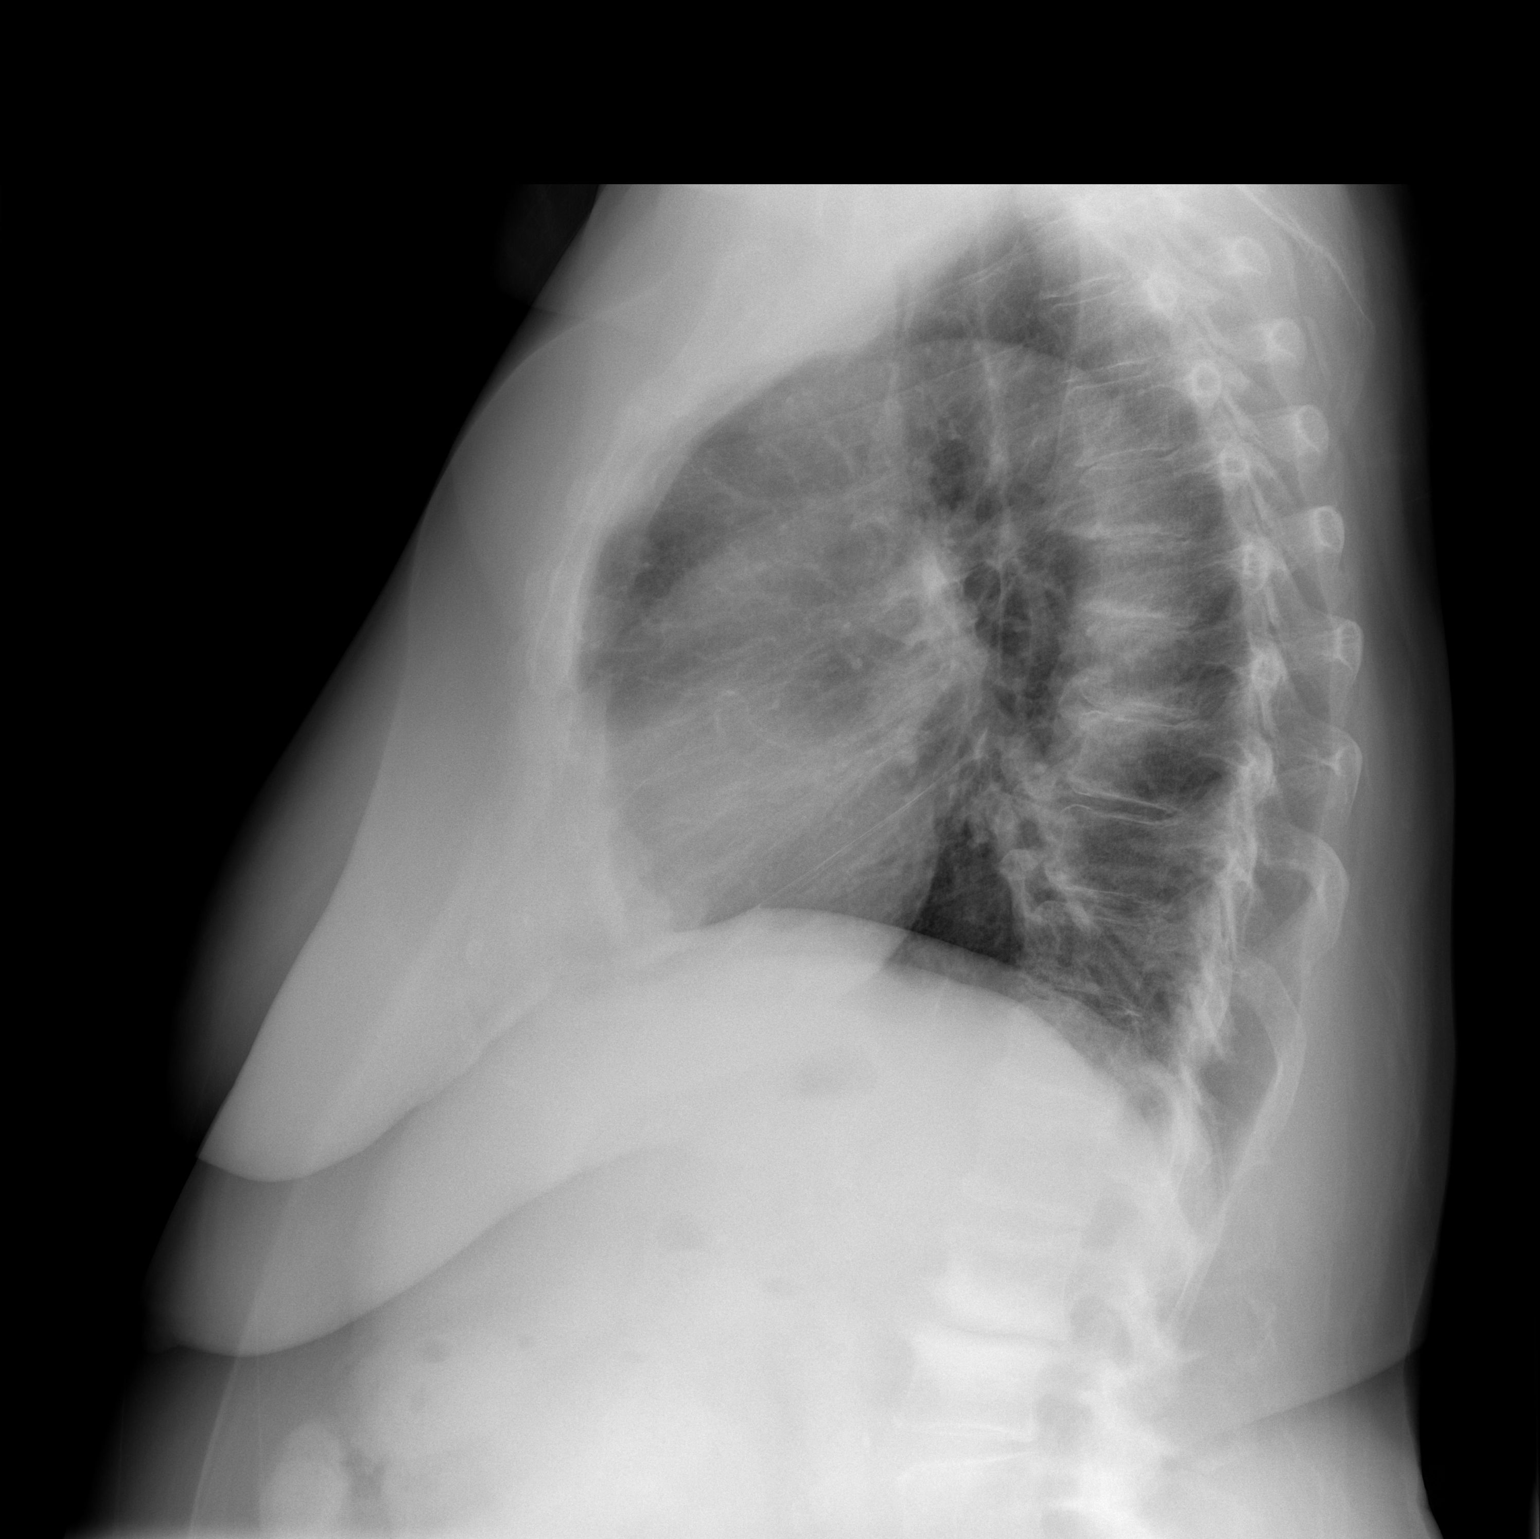

[2 of 2 positions shown; findings below may reference images not displayed]

FINDINGS: The lungs are adequately inflated. There are coarse lung markings in
the right infrahilar region which are stable. Subtle density in the
right suprahilar region is noted but is not greatly changed from the
8202 study. The left lung is clear. The heart is top-normal in size.
The pulmonary vascularity is normal. There is no pleural effusion.
There calcification in the wall of the aortic arch. There is
multilevel degenerative disc disease centered in the midthoracic
spine.
IMPRESSION: There is no acute cardiopulmonary abnormality.

Thoracic aortic atherosclerosis.

## 2018-05-05 ENCOUNTER — Ambulatory Visit: Payer: Medicare Other | Admitting: Family Medicine

## 2018-05-05 ENCOUNTER — Ambulatory Visit: Payer: Medicare Other | Admitting: Pharmacist

## 2018-05-12 ENCOUNTER — Encounter: Payer: Self-pay | Admitting: Family Medicine

## 2018-05-12 ENCOUNTER — Ambulatory Visit (INDEPENDENT_AMBULATORY_CARE_PROVIDER_SITE_OTHER): Payer: Medicare Other | Admitting: Family Medicine

## 2018-05-12 ENCOUNTER — Ambulatory Visit (INDEPENDENT_AMBULATORY_CARE_PROVIDER_SITE_OTHER): Payer: Medicare Other | Admitting: Pharmacist

## 2018-05-12 ENCOUNTER — Other Ambulatory Visit: Payer: Self-pay

## 2018-05-12 ENCOUNTER — Encounter: Payer: Self-pay | Admitting: Pharmacist

## 2018-05-12 VITALS — BP 138/78 | HR 78 | Temp 98.1°F | Ht 65.0 in | Wt 237.0 lb

## 2018-05-12 DIAGNOSIS — E1142 Type 2 diabetes mellitus with diabetic polyneuropathy: Secondary | ICD-10-CM | POA: Diagnosis not present

## 2018-05-12 DIAGNOSIS — I10 Essential (primary) hypertension: Secondary | ICD-10-CM

## 2018-05-12 DIAGNOSIS — D649 Anemia, unspecified: Secondary | ICD-10-CM | POA: Insufficient documentation

## 2018-05-12 DIAGNOSIS — D500 Iron deficiency anemia secondary to blood loss (chronic): Secondary | ICD-10-CM

## 2018-05-12 DIAGNOSIS — Z7901 Long term (current) use of anticoagulants: Secondary | ICD-10-CM

## 2018-05-12 DIAGNOSIS — E1122 Type 2 diabetes mellitus with diabetic chronic kidney disease: Secondary | ICD-10-CM

## 2018-05-12 MED ORDER — LIRAGLUTIDE 18 MG/3ML ~~LOC~~ SOPN: 1.8000 mg | PEN_INJECTOR | Freq: Every day | SUBCUTANEOUS | 0 refills | Status: DC

## 2018-05-12 MED ORDER — DICLOFENAC SODIUM 3 % TD GEL: TRANSDERMAL | 6 refills | Status: DC

## 2018-05-12 MED ORDER — RIVAROXABAN 20 MG PO TABS: 20.0000 mg | ORAL_TABLET | Freq: Every day | ORAL | 0 refills | Status: DC

## 2018-05-12 NOTE — Patient Instructions (Signed)
It was nice to see you today!  Please continue to take your Victoza every day.  Call when you are running out of your Victoza and we can either schedule an appointment with you or coordinate a plan over the phone.

## 2018-05-12 NOTE — Patient Instructions (Addendum)
Please call and leave a message about the specifics of your iron pills.  I want to know the type of iron and how many milligrams.   I will call with the vitamin B12 result. Avoid future steroid shots, it makes the diabetes worse. See [name] again in one month.

## 2018-05-12 NOTE — Assessment & Plan Note (Signed)
Chronic A-fib on Xarelto and reports adherence daily.   Requested samples - provided 2 month supply due to high cost with insurance.

## 2018-05-12 NOTE — Progress Notes (Signed)
    S:     Chief Complaint  Patient presents with  . Medication Management    Diabetes    Patient arrives ambulating well without assistance.  Presents for diabetes evaluation, education, and management at the request of PCP, Dr. Andria Frames.   Patient reports insurance issues with affording her medications. She reports she has been taking her Victoza every other day because the copay is too high.   She states she has been receiving steroid injections PRN for arthritis in her fingers and toes. Last injection reported in February. She reports her blood sugar increased to the 300s after this steroid injection, but have since decreased down to her baseline of 190-250.   She states when she takes her Victoza daily, as prescribed, her blood sugar readings were well controlled in the low 100s and denied any hypoglycemic events.  Insurance coverage/medication affordability: [employer]  is difficult to afford as of 03/2018 - $700 copay Xarelto - $400-500 copay  Patient denies adherence with medications.  Current diabetes medications include: glipizide 10 mg BID, Victoza 1.8 mg daily, metformin XR 2,000 mg daily Current hypertension medications include: amlodipine 5 mg daily, metoprolol XL 100 mg BID  Patient denies hypoglycemic events.  O:  Physical Exam Vitals signs reviewed.  Neurological:     Mental Status: She is alert.    Review of Systems  All other systems reviewed and are negative.  Lab Results  Component Value Date   HGBA1C 9.8 (A) 11/28/2017   Vitals:   05/12/18 0846  BP: 138/78  Pulse: 78  SpO2: 94%    Lipid Panel     Component Value Date/Time   CHOL 274 (H) 12/02/2017 1549   TRIG 120 12/02/2017 1549   HDL 65 12/02/2017 1549   CHOLHDL 4.2 12/02/2017 1549   CHOLHDL 4.7 01/27/2015 1238   VLDL 28 01/27/2015 1238   LDLCALC 185 (H) 12/02/2017 1549   LDLDIRECT 172 (H) 01/24/2017 1008   LDLDIRECT 178 (H) 05/12/2015 1229    Clinical ASCVD:  No  The 10-year ASCVD risk score Mikey Bussing DC Jr., et al., 2013) is: 52.9%   Values used to calculate the score:     Age: 78 years     Sex: Female     Is Non-Hispanic African American: Yes     Diabetic: Yes     Tobacco smoker: No     Systolic Blood Pressure: 902 mmHg     Is BP treated: Yes     HDL Cholesterol: 65 mg/dL     Total Cholesterol: 274 mg/dL    A/P: Diabetes currently uncontrolled due to adherence issues with affording her medication. -Continue current diabetes regimen with Victoza, glipizide, metformin XR -Victoza samples given to patient at visit today -Extensively discussed importance of controlled blood glucose, recommended lifestyle interventions - including alternatives to steroid injections, such as Voltaren gel -Next A1C deferred by Dr. Andria Frames today with adherence issues and recent steroid injection (~1 month ago).   Chronic A-fib on Xarelto and reports adherence daily.   Requested samples - provided 2 month supply due to high cost with insurance.   Hypertension currently contolled.  BP is relatively at goal <130/80 mmHg. Patient is adherent with medication.  -Continue current regimen with amlodipine and metoprolol succinate  Written patient instructions provided.  Total time in face to face counseling  minutes.   Follow up with PCP today. Patient seen with Laretta Bolster PharmD Candidate and Vertis Kelch, PharmD, PGY-1 resident.

## 2018-05-12 NOTE — Assessment & Plan Note (Signed)
Diabetes currently uncontrolled due to adherence issues with affording her medication. -Continue current diabetes regimen with Victoza, glipizide, metformin XR -Victoza samples given to patient at visit today -Extensively discussed importance of controlled blood glucose, recommended lifestyle interventions - including alternatives to steroid injections, such as Voltaren gel -Next A1C deferred by Dr. Andria Frames today with adherence issues and recent steroid injection (~1 month ago).

## 2018-05-12 NOTE — Progress Notes (Signed)
Patient ID: Courtney Pena, female   DOB: 1940-04-01, 78 y.o.   MRN: [id number] Reviewed: Agree with Dr. Graylin Shiver documentation and management.

## 2018-05-13 ENCOUNTER — Telehealth: Payer: Self-pay | Admitting: *Deleted

## 2018-05-13 ENCOUNTER — Encounter: Payer: Self-pay | Admitting: Family Medicine

## 2018-05-13 LAB — VITAMIN B12: Vitamin B-12: 471 pg/mL (ref 232–1245)

## 2018-05-13 MED ORDER — INSULIN PEN NEEDLE 31G X 8 MM MISC: 3 refills | Status: DC

## 2018-05-13 NOTE — Telephone Encounter (Signed)
Noted and await decision.

## 2018-05-13 NOTE — Progress Notes (Signed)
Established Patient Office Visit  Subjective:  Patient ID: Courtney Pena, female    DOB: 04/13/1940  Age: 78 y.o. MRN: [id number]  CC:  Chief Complaint  Patient presents with  . Diabetes    HPI [name] presents for diabetes and other issues.   1. Diabetes.  Has just met with Pharm D, Dr. Valentina Lucks.  Is once again in the donut hole and not taking meds regularly due to cost.  Dr. Valentina Lucks provided samples and will follow up.   2. Hypertension.  Tolerating meds well.  No lightheadedness. 3. Chronic GI bleed.  On Xarelto and followed by GI.  Apparently had camera endoscopy, results pending.  4. Anemia.  Microcytic.  On iron.  Asked if B12 deficiency could play a role.  Last B12 was many years ago.  Borderline low. Worth repeating.   5. Morbid obesity (BMI=39 plus has DM and HBP.)  Had great wt loss for a while.  Now has plateaued.  Not exercising much in the winter. HPDP up to date  Past Medical History:  Diagnosis Date  . Allergy   . Anemia due to GI blood loss 08/12/2011  . Arthritis   . Atrial flutter ([place]) 12-07-2010   converted in ED with 300 mg flecainide  . CAD (coronary artery disease)    a. mild per cath in 2004;  b. nonischemic Myoview in March 2012;  c. Lex MV 1/14:  EF 66%, no ischemia  . Cataract   . Chronic anticoagulation - Xarelto started 07/07/2015, CHADS2CVASC=5 07/07/2015  . Clotting disorder ([place])   . Diastolic CHF ([place]) 7/90/2409  . External hemorrhoids 06/07/2010  . Gastric antral vascular ectasia    source for gi bleed in 07/2011 - Xarelto stopped  . Gastritis   . GERD (gastroesophageal reflux disease)   . Hiatal hernia   . Hyperlipidemia   . Hypertension   . Hypothyroidism   . Neuromuscular disorder ([place])   . Obesity   . Paroxysmal atrial fibrillation (H[facility])   . Personal history of colonic polyps 06/06/2009   cecal polyp  . Sleep apnea    does not use CPAP  . Type 2 diabetes mellitus with diabetic chronic kidney disease ([place])  04/25/2006      . Ulcer     Past Surgical History:  Procedure Laterality Date  . BREAST BIOPSY Left   . BREAST EXCISIONAL BIOPSY Left    benign  . CARDIAC CATHETERIZATION  1999&2004  . CARPAL TUNNEL RELEASE Bilateral 2003  . CATARACT EXTRACTION Bilateral   . COLONOSCOPY    . ESOPHAGOGASTRODUODENOSCOPY  08/13/2011   Procedure: ESOPHAGOGASTRODUODENOSCOPY (EGD);  Surgeon: Gatha Mayer, MD;  Location: [facility] ENDOSCOPY;  Service: Endoscopy;  Laterality: N/A;  . POLYPECTOMY      Family History  Problem Relation Age of Onset  . Heart disease Father   . Diabetes Maternal Grandfather   . Hypertension Mother   . Other Mother        brain tumor-benign  . Diabetes Daughter        pre-diabeties  . Colon polyps Daughter   . Thyroid disease Daughter        x 2  . Colon cancer Neg Hx   . Esophageal cancer Neg Hx   . Stomach cancer Neg Hx   . Rectal cancer Neg Hx     Social History   Socioeconomic History  . Marital status: Widowed    Spouse name: Iona Beard  . Number of children: 4  . Years of  education: some colle  . Highest education level: Not on file  Occupational History  . Occupation: Games developer: UNEMPLOYED  Social Needs  . Financial resource strain: Not on file  . Food insecurity:    Worry: Not on file    Inability: Not on file  . Transportation needs:    Medical: Not on file    Non-medical: Not on file  Tobacco Use  . Smoking status: Former Smoker    Packs/day: 2.00    Years: 30.00    Pack years: 60.00    Types: Cigarettes    Last attempt to quit: 02/26/1993    Years since quitting: 25.2  . Smokeless tobacco: Never Used  Substance and Sexual Activity  . Alcohol use: No    Alcohol/week: 0.0 standard drinks  . Drug use: No  . Sexual activity: Not Currently  Lifestyle  . Physical activity:    Days per week: Not on file    Minutes per session: Not on file  . Stress: Not on file  Relationships  . Social connections:    Talks on phone: Not on file     Gets together: Not on file    Attends religious service: Not on file    Active member of club or organization: Not on file    Attends meetings of clubs or organizations: Not on file    Relationship status: Not on file  . Intimate partner violence:    Fear of current or ex partner: Not on file    Emotionally abused: Not on file    Physically abused: Not on file    Forced sexual activity: Not on file  Other Topics Concern  . Not on file  Social History Narrative  . Not on file    Outpatient Medications Prior to Visit  Medication Sig Dispense Refill  . acetaminophen (TYLENOL) 650 MG CR tablet Take 650 mg by mouth every 8 (eight) hours as needed for pain.    Marland Kitchen amLODipine (NORVASC) 5 MG tablet Take 1 tablet (5 mg total) by mouth daily. 90 tablet 3  . Cholecalciferol (VITAMIN D3) 2000 units TABS Take 4 capsules by mouth daily.     . Coenzyme Q10 (COQ10) 100 MG CAPS Take 100 mEq by mouth daily.     . colchicine 0.6 MG tablet Take 0.6 mg by mouth as needed (gout flare ups).    . diltiazem (CARDIZEM CD) 360 MG 24 hr capsule TAKE 1 CAPSULE BY MOUTH  DAILY 90 capsule 3  . diltiazem (CARDIZEM) 30 MG tablet Take 1 tablet (30 mg total) by mouth every 4 (four) hours as needed (elevated HR as long as BP > 100). 30 tablet 6  . furosemide (LASIX) 20 MG tablet Take 20 mg by mouth daily as needed.     Marland Kitchen glipiZIDE (GLUCOTROL XL) 10 MG 24 hr tablet Take 1 tablet (10 mg total) by mouth 2 (two) times daily. 180 tablet 3  . Lancets (ONETOUCH ULTRASOFT) lancets Use to check blood sugar daily. 100 each 3  . levothyroxine (SYNTHROID, LEVOTHROID) 112 MCG tablet TAKE 1 TABLET BY MOUTH  DAILY 90 tablet 3  . meclizine (ANTIVERT) 25 MG tablet Take 25 mg by mouth 3 (three) times daily as needed for dizziness or nausea.    . metFORMIN (GLUCOPHAGE-XR) 500 MG 24 hr tablet TAKE 4 TABLETS BY MOUTH  DAILY WITH BREAKFAST 360 tablet 3  . metoprolol succinate (TOPROL-XL) 100 MG 24 hr tablet Take 1 tablet (100 mg total)  by mouth  2 (two) times daily. Take with or immediately following a meal. 180 tablet 3  . Multiple Vitamin (MULTI-VITAMINS) TABS Take daily by mouth.    . nitroGLYCERIN (NITROSTAT) 0.4 MG SL tablet Place 1 tablet (0.4 mg total) under the tongue every 5 (five) minutes as needed for chest pain (Call 911 if chest pain after three doses). (Patient not taking: Reported on 05/12/2018) 25 tablet 3  . omeprazole (PRILOSEC) 40 MG capsule TAKE 1 CAPSULE BY MOUTH  DAILY 90 capsule 3  . ONE TOUCH ULTRA TEST test strip TEST 3 TIMES DAILY 300 each 3  . triamcinolone cream (KENALOG) 0.1 % Apply 1 application topically as needed (dermatitis).    Marland Kitchen liraglutide (VICTOZA) 18 MG/3ML SOPN Inject 0.3 mLs (1.8 mg total) into the skin daily. 9 pen 3  . rivaroxaban (XARELTO) 20 MG TABS tablet Take 1 tablet (20 mg total) by mouth daily with supper. 28 tablet 0   No facility-administered medications prior to visit.     Allergies  Allergen Reactions  . Lisinopril Swelling    REACTION: swelling, may have had some breathing involvement.   . Nsaids Other (See Comments)    Patient reports internal bleeding  . Penicillins Shortness Of Breath and Swelling    Arm Swelling with Penicillin (Occurred in 1960s) Breathing - throat swelling with Amoxicillin (Occurred prior to 2002)  . Sulfamethoxazole Hives and Itching    "welps all over" immediately after dose  . Tolmetin Other (See Comments)    Patient reports internal bleeding  . Actos [Pioglitazone] Swelling    REACTION: swelling all over body  . Flecainide Other (See Comments)    Blurry  vision  . Gabapentin Other (See Comments)    Caused dysphoria   . Iodinated Diagnostic Agents Itching    Pt. Developed mild itching after receiving IV cm; pt. Held; Dr. Keane Scrape recomended she take 50 mg of benadryl when she goes home-if necessary; Dr Mickey Farber recommends benadryl prior to future exams requiring contrast media, but stated other doctors may recommend another premedication prep.   . Statins Other (See Comments)    Muscle aches with multiple statins  . Losartan Potassium Other (See Comments)    Lower extremity swelling  . Zetia [Ezetimibe] Other (See Comments)    Cramps    ROS Review of Systems    Objective:    Physical Exam  BP 138/78   Pulse 78   Temp 98.1 F (36.7 C) (Oral)   Ht _0  (1.651 m)   Wt 237 lb (107.5 kg)   SpO2 94%   BMI 39.44 kg/m  Wt Readings from Last 3 Encounters:  05/12/18 237 lb (107.5 kg)  05/12/18 237 lb (107.5 kg)  04/23/18 237 lb (107.5 kg)   Lungs clear Cardiac RRR without m or g Abd benign Ext trace edema  There are no preventive care reminders to display for this patient.  There are no preventive care reminders to display for this patient.  Lab Results  Component Value Date   TSH 2.160 11/28/2017   Lab Results  Component Value Date   WBC 8.7 04/14/2018   HGB 8.7 (L) 04/14/2018   HCT 28.6 (L) 04/14/2018   MCV 78 (L) 04/14/2018   PLT 341 04/14/2018   Lab Results  Component Value Date   NA 136 04/14/2018   K 4.8 04/14/2018   CO2 20 04/14/2018   GLUCOSE 240 (H) 04/14/2018   BUN 22 04/14/2018   CREATININE 1.27 (H) 04/14/2018  BILITOT <0.2 11/28/2017   ALKPHOS 100 11/28/2017   AST 10 11/28/2017   ALT 12 11/28/2017   PROT 7.3 11/28/2017   ALBUMIN 4.3 11/28/2017   CALCIUM 9.6 04/14/2018   ANIONGAP 9 07/28/2017   GFR 75.20 03/20/2017   Lab Results  Component Value Date   CHOL 274 (H) 12/02/2017   Lab Results  Component Value Date   HDL 65 12/02/2017   Lab Results  Component Value Date   LDLCALC 185 (H) 12/02/2017   Lab Results  Component Value Date   TRIG 120 12/02/2017   Lab Results  Component Value Date   CHOLHDL 4.2 12/02/2017   Lab Results  Component Value Date   HGBA1C 9.8 (A) 11/28/2017      Assessment & Plan:   Problem List Items Addressed This Visit    Type 2 diabetes mellitus with diabetic chronic kidney disease ([place]) - Primary   Relevant Medications   Insulin Pen  Needle 31G X 8 MM MISC   Diabetic neuropathy (HCC)   Relevant Medications   Diclofenac Sodium 3 % GEL   Anemia   Relevant Orders   Vitamin B12 (Completed)      Meds ordered this encounter  Medications  . Diclofenac Sodium 3 % GEL    Sig: Apply to skin of arthritis joints.    Dispense:  100 g    Refill:  6  . Insulin Pen Needle 31G X 8 MM MISC    Sig: Use with pen to inject once daily.    Dispense:  100 each    Refill:  3    Follow-up: No follow-ups on file.    [name], MD

## 2018-05-13 NOTE — Assessment & Plan Note (Signed)
Continue to weigh risk benefit of anticoagulation.  Hgb seems to be holding steady.

## 2018-05-13 NOTE — Assessment & Plan Note (Signed)
Main cause of anemia.

## 2018-05-13 NOTE — Telephone Encounter (Signed)
Med denied for the following reason.   "Diclofenac sodium 3% topical gel is not FDA approved for your medical condition(s): arthritis.   If the treating physician would like to discuss this coverage decision with the physician or health care professional reviewer, please call [employer] Prior Authorization department at [phone].  Will forward to MD. Christen Bame, CMA

## 2018-05-13 NOTE — Assessment & Plan Note (Signed)
Poor control. Cost a med barrier.  Samples given.

## 2018-05-13 NOTE — Assessment & Plan Note (Signed)
Great control. 

## 2018-05-13 NOTE — Assessment & Plan Note (Signed)
Check b12. 

## 2018-05-13 NOTE — Telephone Encounter (Signed)
PA needed on Diclofenac gel.  Clinical questions submitted via Cover My Meds.  Waiting on response, could take up to 72 hours.  Cover My Meds info: Key: [id number]   Christen Bame, CMA

## 2018-05-13 NOTE — Assessment & Plan Note (Signed)
Once again emphasized diet and exercise.

## 2018-05-14 NOTE — Telephone Encounter (Signed)
Insurance denied diclofenac gel.  Called and informed.  She has three options 1. Pay out of pocket for diclofenac. 2. OTC lidocaine patch 3. OTC Capsicum

## 2018-05-28 ENCOUNTER — Ambulatory Visit: Payer: Medicare Other

## 2018-05-28 IMAGING — NM NM HEPATO W/GB/PHARM/[PERSON_NAME]
3 series · 13 of 13 positions shown · non-contrast
Comparison: None.

CLINICAL DATA: Upper abdominal pain

EXAM:
NUCLEAR MEDICINE HEPATOBILIARY IMAGING WITH GALLBLADDER EF
VIEWS:
Anterior, right lateral right upper quadrant
RADIOPHARMACEUTICALS:  5.0 mCi 2c-22m  Choletec IV

[he hepatobiliary · 2.26mm/px · 1 of 1 slices shown (1 of 3)]
[im 1/1]
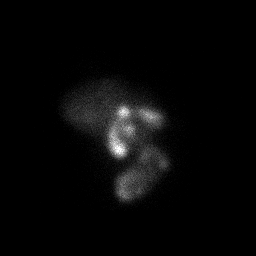

[he hepatobiliary · 3.10mm/px · 6 of 60 frames shown (2 of 3)]
[frame 6/60]
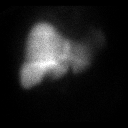
[frame 16/60]
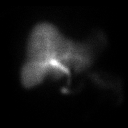
[frame 26/60]
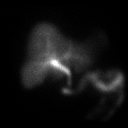
[frame 36/60]
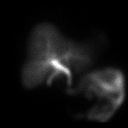
[frame 46/60]
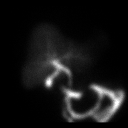
[frame 56/60]
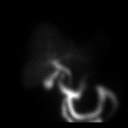

[he hepatobiliary · 3.10mm/px · 6 of 60 frames shown (3 of 3)]
[frame 6/60]
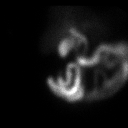
[frame 16/60]
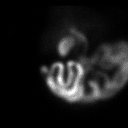
[frame 26/60]
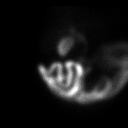
[frame 36/60]
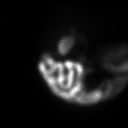
[frame 46/60]
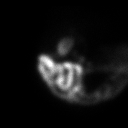
[frame 56/60]
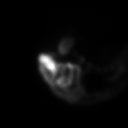

[13 of 13 positions shown; findings below may reference images not displayed]

FINDINGS: Liver uptake of radiotracer is unremarkable. There is prompt
visualization of gallbladder and small bowel, indicating patency of
the cystic and common bile ducts. The patient consumed 8 ounces of
Ensure Complete orally with calculation of the computer generated
ejection fraction of radiotracer from the gallbladder. The patient
did not report clinical symptoms with the oral Ensure Complete
consumption.

The computer generated ejection fraction of radiotracer from the
gallbladder is diminished at 20%, normal greater than 33% using the
oral agent.
IMPRESSION: Abnormally low ejection fraction of radiotracer from the
gallbladder, a finding felt to be indicative of biliary dyskinesia.
Cystic and common bile ducts are patent as is evidenced by
visualization of gallbladder and small bowel.

## 2018-07-04 ENCOUNTER — Telehealth: Payer: Self-pay | Admitting: Pharmacist

## 2018-07-04 NOTE — Telephone Encounter (Signed)
Patient called to request samples of Xarelto and Victoza.   She reports adherence and states her blood sugars are well controlled.  CBGs reported: 115, 110 and other readings 100-130.   Denied hypoglycemic symptoms (no readings < 100).   Medication Samples have been provided to the patient.  Drug name: Xarelto       Strength: 20mg         Qty: 28  LOT: [id number]  Exp.Date: 08/26/2019  Dosing instructions: once daily with evening meal  The patient has been instructed regarding the correct time, dose, and frequency of taking this medication, including desired effects and most common side effects.     Drug name: Victoza  Qty: 3 pens  LOT: [id number]  Exp.Date: 08/26/2018  The patient has been instructed regarding the correct time, dose, and frequency of taking this medication, including desired effects and most common side effects.   Courtney Pena 11:40AM 07/04/2018 Plans to pick up medication on Monday 5/11 in the AM  (Vicotza in the refrigerator and Xarelto in Rx office)  She inquired if we were doing visits.  I replied yes, however we are also trying to increase our use of tele health visits with patients doing well.

## 2018-07-07 NOTE — Telephone Encounter (Signed)
Noted and agree. 

## 2018-07-10 ENCOUNTER — Ambulatory Visit: Payer: Medicare Other

## 2018-07-11 ENCOUNTER — Other Ambulatory Visit: Payer: Self-pay | Admitting: Family Medicine

## 2018-07-11 DIAGNOSIS — E1122 Type 2 diabetes mellitus with diabetic chronic kidney disease: Secondary | ICD-10-CM

## 2018-07-16 ENCOUNTER — Ambulatory Visit: Payer: Medicare Other

## 2018-07-28 ENCOUNTER — Telehealth: Payer: Self-pay | Admitting: Family Medicine

## 2018-07-28 NOTE — Telephone Encounter (Signed)
Patient requested help with samples   Victoza 1.8 and Xarelto 20mg  daily  Plan to provide for pick up on 6/2 in the PM   Also requests alternative to the $$$ Diclofenac Gel which she said was > $1000  Plan to discuss with Dr. Andria Frames.

## 2018-07-28 NOTE — Telephone Encounter (Signed)
Pt called requesting a call from Janeann Forehand concerning medications. Please give pt a call back.

## 2018-07-29 NOTE — Telephone Encounter (Signed)
Patient requested samples were marked and left for pick-up.   Medication Samples have been provided to the patient.  Drug name: Victoza  Qty: 3  LOT: [id number]  Exp.Date: 12/27/2018  The patient has been instructed regarding the correct time, dose, and frequency of taking this medication, including desired effects and most common side effects.     Medication Samples have been provided to the patient.  Drug name: Xarelto       Strength: 20mg         Qty: 42  LOT: [id number]  Exp.Date: 02/2020  Dosing instructions: one daily  The patient has been instructed regarding the correct time, dose, and frequency of taking this medication, including desired effects and most common side effects.   Courtney Pena 4:09 PM 07/29/2018   Patient picked up supply this afternoon.

## 2018-07-30 ENCOUNTER — Encounter: Payer: Self-pay | Admitting: Family Medicine

## 2018-07-30 ENCOUNTER — Telehealth: Payer: Self-pay | Admitting: *Deleted

## 2018-07-30 ENCOUNTER — Telehealth (INDEPENDENT_AMBULATORY_CARE_PROVIDER_SITE_OTHER): Payer: Medicare Other | Admitting: Family Medicine

## 2018-07-30 ENCOUNTER — Other Ambulatory Visit: Payer: Self-pay

## 2018-07-30 DIAGNOSIS — Z20828 Contact with and (suspected) exposure to other viral communicable diseases: Secondary | ICD-10-CM | POA: Diagnosis not present

## 2018-07-30 DIAGNOSIS — Z20822 Contact with and (suspected) exposure to covid-19: Secondary | ICD-10-CM | POA: Insufficient documentation

## 2018-07-30 NOTE — Telephone Encounter (Signed)
-----   Message from Courtney Resides, MD sent at 07/30/2018  3:58 PM EDT ----- Patient had a workplace exposure to [place] positive co-worker.  She is asymptomatic.  Desires testing.  I entered the order.

## 2018-07-30 NOTE — Telephone Encounter (Signed)
Called patient.  She had very good response to topical diclofenac.  I told her they are now selling Voltaren topical OTC.  She will check it out.  Also recommended OTC Capsecum roll on as an alternative.  She will make a face-to-face visit with me soon.

## 2018-07-30 NOTE — Telephone Encounter (Signed)
Notified patient to schedule an appointment to be tested for covid-19. Pt scheduled for Thursday 07/31/2018 at 11:15 at [facility] testing site. Advised to stay in car with windows rolled up and mask on until time for testing. Pt voiced understanding.

## 2018-07-30 NOTE — Progress Notes (Signed)
Agrees to phone visit Patient just found out today that a coworker tested positive for COVID and "they are shutting down my workplace."  She has absolutely no symptoms. She desires testing based on being in a high risk age group.  Also does want to expose her son or grandson who live next door.   Duration of call was 12 minutes.

## 2018-07-30 NOTE — Assessment & Plan Note (Signed)
Exposed by coworker.  Notified today.  Patient has no symptoms.  Desires testing.  She is high risk based on age

## 2018-07-31 ENCOUNTER — Other Ambulatory Visit: Payer: Medicare Other

## 2018-07-31 DIAGNOSIS — Z20822 Contact with and (suspected) exposure to covid-19: Secondary | ICD-10-CM

## 2018-07-31 DIAGNOSIS — Z20828 Contact with and (suspected) exposure to other viral communicable diseases: Secondary | ICD-10-CM

## 2018-08-01 LAB — NOVEL CORONAVIRUS, NAA: SARS-CoV-2, NAA: NOT DETECTED

## 2018-08-28 ENCOUNTER — Encounter: Payer: Self-pay | Admitting: Family Medicine

## 2018-08-28 ENCOUNTER — Other Ambulatory Visit: Payer: Self-pay

## 2018-08-28 ENCOUNTER — Ambulatory Visit: Payer: Medicare Other | Admitting: Pharmacist

## 2018-08-28 ENCOUNTER — Encounter: Payer: Self-pay | Admitting: Pharmacist

## 2018-08-28 ENCOUNTER — Ambulatory Visit (INDEPENDENT_AMBULATORY_CARE_PROVIDER_SITE_OTHER): Payer: Medicare Other | Admitting: Family Medicine

## 2018-08-28 VITALS — BP 132/72 | HR 74

## 2018-08-28 DIAGNOSIS — I480 Paroxysmal atrial fibrillation: Secondary | ICD-10-CM | POA: Diagnosis not present

## 2018-08-28 DIAGNOSIS — E1122 Type 2 diabetes mellitus with diabetic chronic kidney disease: Secondary | ICD-10-CM

## 2018-08-28 DIAGNOSIS — M791 Myalgia, unspecified site: Secondary | ICD-10-CM | POA: Insufficient documentation

## 2018-08-28 DIAGNOSIS — E1169 Type 2 diabetes mellitus with other specified complication: Secondary | ICD-10-CM

## 2018-08-28 DIAGNOSIS — E7800 Pure hypercholesterolemia, unspecified: Secondary | ICD-10-CM | POA: Diagnosis not present

## 2018-08-28 DIAGNOSIS — D500 Iron deficiency anemia secondary to blood loss (chronic): Secondary | ICD-10-CM

## 2018-08-28 DIAGNOSIS — T466X5A Adverse effect of antihyperlipidemic and antiarteriosclerotic drugs, initial encounter: Secondary | ICD-10-CM | POA: Insufficient documentation

## 2018-08-28 DIAGNOSIS — R1030 Lower abdominal pain, unspecified: Secondary | ICD-10-CM

## 2018-08-28 DIAGNOSIS — E785 Hyperlipidemia, unspecified: Secondary | ICD-10-CM

## 2018-08-28 LAB — POCT GLYCOSYLATED HEMOGLOBIN (HGB A1C): HbA1c, POC (controlled diabetic range): 8.9 % — AB (ref 0.0–7.0)

## 2018-08-28 MED ORDER — RIVAROXABAN 20 MG PO TABS: 20.0000 mg | ORAL_TABLET | Freq: Every day | ORAL | 0 refills | Status: DC

## 2018-08-28 NOTE — Assessment & Plan Note (Signed)
Check hgb

## 2018-08-28 NOTE — Patient Instructions (Signed)
I will call Monday with lab test results. Great work on the weight loss.   Depending on how the labs are, I will see you in 3-6 months.

## 2018-08-28 NOTE — Progress Notes (Signed)
2

## 2018-08-28 NOTE — Assessment & Plan Note (Signed)
Diabetes longstanding currently uncontrolled and limited by affordability. A1c today 8.9%, improved from 9.8% months ago. - Continued metformin XR 2000 mg QAM, glipizide XL 5 mg BID, and Victoza 1.8 mg daily.  - Appears patient will qualify for patient assistance for Victoza (liraglutide) through [employer]. Completed application today with patient and primary care provider Dr. Andria Frames. Patient will bring back proof of out of pocket spend to clinic next week, and we will submit application.  - Moving forward, could consider SGLT2 due to patient's CKD, pending labwork and total income requirements for programs, as patient is unable to afford copayments.  -Extensively discussed pathophysiology of DM, recommended lifestyle interventions, dietary effects on glycemic control -Counseled on s/sx of and management of hypoglycemia

## 2018-08-28 NOTE — Assessment & Plan Note (Signed)
Afib - reports adherence.  Denies any S/Sx or bleeding or bruising.  Samples of rivaroxaban provided.

## 2018-08-28 NOTE — Assessment & Plan Note (Signed)
ASCVD risk - primary prevention in patient with DM. Last LDL is not controlled. ASCVD risk score is >20%  -multi-statin intolerance may necessitate use of lower dose/intensity statin. Patient seen in [facility] 11/2017 and [id number] assistance was pursued, however, patient did not qualify. Appears she was provided number to the [employer] whose hyperlipidemia assistance program is open.  - Will reengage lipid clinic on the patient's behalf**

## 2018-08-28 NOTE — Assessment & Plan Note (Signed)
On xarelto.  Rate controled.  Check Hgb.

## 2018-08-28 NOTE — Assessment & Plan Note (Signed)
Intollerant to multiple statins.

## 2018-08-28 NOTE — Assessment & Plan Note (Signed)
intollerant multiple statins.

## 2018-08-28 NOTE — Patient Instructions (Addendum)
It was great to see you today!  We provided samples of Xarelto, and it looks like you are going to qualify for Victoza patient assistance. For a 1 person household, the total yearly income needs to be less than $51,000.   We completed your portion and Dr. Lowella Bandy portion of the Novo Nordisk/Victoza application today. What we need from you is proof of your income. This would be: 1) Copy of your 2020 social security awards letter OR the 2019 1099 form from Social Security 2) Proof of the retirement income you receive.    Please bring this back by clinic and leave for Dr. Valentina Lucks. We will submit once we have that from you, and let you know when we hear something from the drug company

## 2018-08-28 NOTE — Progress Notes (Signed)
Established Patient Office Visit  Subjective:  Patient ID: Courtney Pena, female    DOB: [birth year]  Age: 78 y.o. MRN: [id number]  CC:  Chief Complaint  Patient presents with  . Diabetes    HPI Courtney Pena presents for diabetes recheck +.  Issues. 1. Here today and also seen by pharm.  She is in donut hole.  They are helping her sign up for med assist.  A1C is not yet at goal, but is improving.  She is also showing steady wt loss. 2. Anemia.  On DOAC for A fib.  Has had slow gi bleed.  Needs periodic Hgb check.   3. Wants gyn referral.  No vag bleeding.  Has some heaviness and a falling sensation in her pelvis.  Wants GYn referral.  Prefers that I not do the exam. 4. Diabetic and not on statin.  She has been intolerant to multiple statins in the past due to myalgias.  Unwilling to try again.   Past Medical History:  Diagnosis Date  . Allergy   . Anemia due to GI blood loss 08/12/2011  . Arthritis   . Atrial flutter ([place]) 12-07-2010   converted in ED with 300 mg flecainide  . CAD (coronary artery disease)    a. mild per cath in 2004;  b. nonischemic Myoview in March 2012;  c. Lex MV 1/14:  EF 66%, no ischemia  . Cataract   . Chronic anticoagulation - Xarelto started 07/07/2015, CHADS2CVASC=5 07/07/2015  . Clotting disorder ([place])   . Diastolic CHF ([place]) 2/94/7654  . External hemorrhoids 06/07/2010  . Gastric antral vascular ectasia    source for gi bleed in 07/2011 - Xarelto stopped  . Gastritis   . GERD (gastroesophageal reflux disease)   . Hiatal hernia   . Hyperlipidemia   . Hypertension   . Hypothyroidism   . Neuromuscular disorder ([place])   . Obesity   . Paroxysmal atrial fibrillation ([facility])   . Personal history of colonic polyps 06/06/2009   cecal polyp  . Sleep apnea    does not use CPAP  . Type 2 diabetes mellitus with diabetic chronic kidney disease ([place]) 04/25/2006      . Ulcer     Past Surgical History:  Procedure Laterality Date  .  BREAST BIOPSY Left   . BREAST EXCISIONAL BIOPSY Left    benign  . CARDIAC CATHETERIZATION  1999&2004  . CARPAL TUNNEL RELEASE Bilateral 2003  . CATARACT EXTRACTION Bilateral   . COLONOSCOPY    . ESOPHAGOGASTRODUODENOSCOPY  08/13/2011   Procedure: ESOPHAGOGASTRODUODENOSCOPY (EGD);  Surgeon: Gatha Mayer, MD;  Location[facility] ENDOSCOPY;  Service: Endoscopy;  Laterality: N/A;  . POLYPECTOMY      Family History  Problem Relation Age of Onset  . Heart disease Father   . Diabetes Maternal Grandfather   . Hypertension Mother   . Other Mother        brain tumor-benign  . Diabetes Daughter        pre-diabeties  . Colon polyps Daughter   . Thyroid disease Daughter        x 2  . Colon cancer Neg Hx   . Esophageal cancer Neg Hx   . Stomach cancer Neg Hx   . Rectal cancer Neg Hx     Social History   Socioeconomic History  . Marital status: Widowed    Spouse name: Iona Beard  . Number of children: 4  . Years of education: some colle  . Highest education level: Not  on file  Occupational History  . Occupation: Games developer: UNEMPLOYED  Social Needs  . Financial resource strain: Not on file  . Food insecurity    Worry: Not on file    Inability: Not on file  . Transportation needs    Medical: Not on file    Non-medical: Not on file  Tobacco Use  . Smoking status: Former Smoker    Packs/day: 2.00    Years: 30.00    Pack years: 60.00    Types: Cigarettes    Quit date: 02/26/1993    Years since quitting: 25.5  . Smokeless tobacco: Never Used  Substance and Sexual Activity  . Alcohol use: No    Alcohol/week: 0.0 standard drinks  . Drug use: No  . Sexual activity: Not Currently  Lifestyle  . Physical activity    Days per week: Not on file    Minutes per session: Not on file  . Stress: Not on file  Relationships  . Social Herbalist on phone: Not on file    Gets together: Not on file    Attends religious service: Not on file    Active member of club or  organization: Not on file    Attends meetings of clubs or organizations: Not on file    Relationship status: Not on file  . Intimate partner violence    Fear of current or ex partner: Not on file    Emotionally abused: Not on file    Physically abused: Not on file    Forced sexual activity: Not on file  Other Topics Concern  . Not on file  Social History Narrative  . Not on file    Outpatient Medications Prior to Visit  Medication Sig Dispense Refill  . acetaminophen (TYLENOL) 650 MG CR tablet Take 650 mg by mouth every 8 (eight) hours as needed for pain.    Marland Kitchen amLODipine (NORVASC) 5 MG tablet Take 1 tablet (5 mg total) by mouth daily. 90 tablet 3  . Cholecalciferol (VITAMIN D3) 2000 units TABS Take 4 capsules by mouth daily.     . Coenzyme Q10 (COQ10) 100 MG CAPS Take 100 mEq by mouth daily.     . colchicine 0.6 MG tablet Take 0.6 mg by mouth as needed (gout flare ups).    . Diclofenac Sodium 3 % GEL Apply to skin of arthritis joints. (Patient not taking: Reported on 08/28/2018) 100 g 6  . diltiazem (CARDIZEM CD) 360 MG 24 hr capsule TAKE 1 CAPSULE BY MOUTH  DAILY 90 capsule 3  . diltiazem (CARDIZEM) 30 MG tablet Take 1 tablet (30 mg total) by mouth every 4 (four) hours as needed (elevated HR as long as BP > 100). 30 tablet 6  . ferrous sulfate 325 (65 FE) MG EC tablet Take 650 mg by mouth daily.     . furosemide (LASIX) 20 MG tablet Take 20 mg by mouth daily as needed.     Marland Kitchen glipiZIDE (GLUCOTROL XL) 10 MG 24 hr tablet Take 1 tablet (10 mg total) by mouth 2 (two) times daily. 180 tablet 3  . Insulin Pen Needle 31G X 8 MM MISC Use with pen to inject once daily. 100 each 3  . Lancets (ONETOUCH ULTRASOFT) lancets Use to check blood sugar daily. 100 each 3  . levothyroxine (SYNTHROID, LEVOTHROID) 112 MCG tablet TAKE 1 TABLET BY MOUTH  DAILY 90 tablet 3  . liraglutide (VICTOZA) 18 MG/3ML SOPN Inject 0.3 mLs (1.8  mg total) into the skin daily. 3 pen 0  . meclizine (ANTIVERT) 25 MG tablet Take  25 mg by mouth 3 (three) times daily as needed for dizziness or nausea.    . metFORMIN (GLUCOPHAGE-XR) 500 MG 24 hr tablet TAKE 4 TABLETS BY MOUTH  DAILY WITH BREAKFAST 360 tablet 3  . metoprolol succinate (TOPROL-XL) 100 MG 24 hr tablet Take 1 tablet (100 mg total) by mouth 2 (two) times daily. Take with or immediately following a meal. 180 tablet 3  . Multiple Vitamin (MULTI-VITAMINS) TABS Take daily by mouth.    . nitroGLYCERIN (NITROSTAT) 0.4 MG SL tablet Place 1 tablet (0.4 mg total) under the tongue every 5 (five) minutes as needed for chest pain (Call 911 if chest pain after three doses). (Patient not taking: Reported on 05/12/2018) 25 tablet 3  . omeprazole (PRILOSEC) 40 MG capsule TAKE 1 CAPSULE BY MOUTH  DAILY 90 capsule 3  . ONE TOUCH ULTRA TEST test strip TEST 3 TIMES DAILY 300 each 3  . triamcinolone cream (KENALOG) 0.1 % Apply 1 application topically as needed (dermatitis).    . rivaroxaban (XARELTO) 20 MG TABS tablet Take 1 tablet (20 mg total) by mouth daily with supper. 56 tablet 0   No facility-administered medications prior to visit.     Allergies  Allergen Reactions  . Lisinopril Swelling    REACTION: swelling, may have had some breathing involvement.   . Nsaids Other (See Comments)    Patient reports internal bleeding  . Penicillins Shortness Of Breath and Swelling    Arm Swelling with Penicillin (Occurred in 1960s) Breathing - throat swelling with Amoxicillin (Occurred prior to 2002)  . Sulfamethoxazole Hives and Itching    "welps all over" immediately after dose  . Tolmetin Other (See Comments)    Patient reports internal bleeding  . Actos [Pioglitazone] Swelling    REACTION: swelling all over body  . Flecainide Other (See Comments)    Blurry  vision  . Gabapentin Other (See Comments)    Caused dysphoria   . Iodinated Diagnostic Agents Itching    Pt. Developed mild itching after receiving IV cm; pt. Held; Dr. Keane Scrape recomended she take 50 mg of benadryl when  she goes home-if necessary; Dr Mickey Farber recommends benadryl prior to future exams requiring contrast media, but stated other doctors may recommend another premedication prep.  . Statins Other (See Comments)    Muscle aches with multiple statins  . Losartan Potassium Other (See Comments)    Lower extremity swelling  . Zetia [Ezetimibe] Other (See Comments)    Cramps    ROS Review of Systems    Objective:    Physical Exam  BP 132/72   Pulse 74   SpO2 97%  Wt Readings from Last 3 Encounters:  08/28/18 229 lb 3.2 oz (104 kg)  07/30/18 230 lb (104.3 kg)  05/12/18 237 lb (107.5 kg)  Lungs clear Cardiac RRR without m or g abd benign.   Health Maintenance Due  Topic Date Due  . HEMOGLOBIN A1C  05/30/2018    There are no preventive care reminders to display for this patient.  Lab Results  Component Value Date   TSH 2.160 11/28/2017   Lab Results  Component Value Date   WBC 8.7 04/14/2018   HGB 8.7 (L) 04/14/2018   HCT 28.6 (L) 04/14/2018   MCV 78 (L) 04/14/2018   PLT 341 04/14/2018   Lab Results  Component Value Date   NA 136 04/14/2018   K  4.8 04/14/2018   CO2 20 04/14/2018   GLUCOSE 240 (H) 04/14/2018   BUN 22 04/14/2018   CREATININE 1.27 (H) 04/14/2018   BILITOT <0.2 11/28/2017   ALKPHOS 100 11/28/2017   AST 10 11/28/2017   ALT 12 11/28/2017   PROT 7.3 11/28/2017   ALBUMIN 4.3 11/28/2017   CALCIUM 9.6 04/14/2018   ANIONGAP 9 07/28/2017   GFR 75.20 03/20/2017   Lab Results  Component Value Date   CHOL 274 (H) 12/02/2017   Lab Results  Component Value Date   HDL 65 12/02/2017   Lab Results  Component Value Date   LDLCALC 185 (H) 12/02/2017   Lab Results  Component Value Date   TRIG 120 12/02/2017   Lab Results  Component Value Date   CHOLHDL 4.2 12/02/2017   Lab Results  Component Value Date   HGBA1C 8.9 (A) 08/28/2018      Assessment & Plan:   Problem List Items Addressed This Visit    Anemia due to GI blood loss   Relevant  Orders   CBC   Hyperlipidemia associated with type 2 diabetes mellitus ([place])   Relevant Orders   LDL cholesterol, direct   Lower abdominal pain   Relevant Orders   Ambulatory referral to Gynecology   Myalgia due to statin    intollerant multiple statins.      Type 2 diabetes mellitus with diabetic chronic kidney disease ([place]) - Primary   Relevant Orders   POCT glycosylated hemoglobin (Hb A1C) (Completed)   Basic metabolic panel      No orders of the defined types were placed in this encounter.   Follow-up: No follow-ups on file.    [name], MD

## 2018-08-28 NOTE — Assessment & Plan Note (Signed)
Improving control, not yet at goal.

## 2018-08-28 NOTE — Progress Notes (Signed)
S:     Chief Complaint  Patient presents with  . Medication Management    diabetes, anticoagulation    Patient arrives in good spirits, ambulating without assistance.  Presents for diabetes evaluation, education, and management.  Continues to have financial challenges and requests support with samples for Victoza and Xarelto.    Patient was last seen through virtual visit with Primary Care Provider, Dr. Andria Frames, on 07/30/2018 and has an appointment with him today.   Patient endorses some concern about her cholesterol today, and if she is getting adequate iron levels from her current supplement. Will get labwork today with Dr. Andria Frames.  Insurance coverage/medication affordability: [employer], but copayments for brand medications are too expensive.   Patient reports moderate adherence with medications Current diabetes medications include: Glipizide XL 5 mg BID (sometimes missing second dose), Metformin XL 2000 mg QHS and Victoza 1.8 mg daily Current hypertension medications include: Metoprolol XL 100 mg BID (sometimes skips second dose based on BP) and Diltiazem CD 360 mg QAM (has not needed PRN doses) Current hyperlipidemia medications include: Unable to tolerate many options (review with Lipid Clinic included rosuvastatin 5 mg weekly, pravastatin at many doses, and ezetimibe. Copayment for PCSK9 was too expensive, and she was denied for patient assistance for this program)  Patient denies hypoglycemic events. 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     O:  Physical Exam Vitals signs reviewed.  Musculoskeletal:        General: No swelling.  Psychiatric:        Mood and Affect: Mood normal.    Review of Systems  Cardiovascular: Negative for chest pain.  Gastrointestinal: Negative for  constipation.  Neurological: Positive for tingling.    Lab Results  Component Value Date   HGBA1C 8.9 (A) 08/28/2018   Vitals:   08/28/18 0854  SpO2: 96%    Lipid Panel     Component Value Date/Time   CHOL 274 (H) 12/02/2017 1549   TRIG 120 12/02/2017 1549   HDL 65 12/02/2017 1549   CHOLHDL 4.2 12/02/2017 1549   CHOLHDL 4.7 01/27/2015 1238   VLDL 28 01/27/2015 1238   LDLCALC 185 (H) 12/02/2017 1549   LDLDIRECT 172 (H) 01/24/2017 1008   LDLDIRECT 178 (H) 05/12/2015 1229   Home BG: Reported home blood glucose - Max of 270 and mostly readings high 100s, and lower 100s in the evening.   Home BP:  Reports home blood pressures of 120-130/75-80 with HR of 75-80 with rate elevations of ~ 100.   Clinical ASCVD: No  The 10-year ASCVD risk score Mikey Bussing DC Jr., et al., 2013) is: 51%   Values used to calculate the score:     Age: 69 years     Sex: Female     Is Non-Hispanic African American: Yes     Diabetic: Yes     Tobacco smoker: No     Systolic Blood Pressure: 696 mmHg     Is BP treated: Yes     HDL Cholesterol: 65 mg/dL     Total Cholesterol: 274 mg/dL    A/P: Diabetes longstanding currently uncontrolled and limited by affordability. A1c today 8.9%, improved from 9.8% months ago. - Continued metformin XR 2000 mg QAM, glipizide XL 5 mg BID, and Victoza 1.8 mg daily.  - Appears patient will qualify for patient assistance  for Victoza (liraglutide) through [employer]. Completed application today with patient and primary care provider Dr. Andria Frames. Patient will bring back proof of out of pocket spend to clinic next week, and we will submit application.  - Moving forward, could consider SGLT2 due to patient's CKD, pending labwork and total income requirements for programs, as patient is unable to afford copayments.  -Extensively discussed pathophysiology of DM, recommended lifestyle interventions, dietary effects on glycemic control -Counseled on s/sx of and management of  hypoglycemia  ASCVD risk - primary prevention in patient with DM. Last LDL is not controlled. ASCVD risk score is >20%  -multi-statin intolerance may necessitate use of lower dose/intensity statin. Patient seen in [facility] 11/2017 and PCSK9i assistance was pursued, however, patient did not qualify. Appears she was provided number to the [employer] whose hyperlipidemia assistance program is open.  - Will reengage lipid clinic on the patient's behalf**  Medication Management -  - Patient's iron supplement contains 8 mg elemental iron. She is currently taking 3 daily. Encouraged that she may need to increase to 4-5 per day, pending lab results ordered by Dr. Salvatore Marvel - reports adherence.  Denies any S/Sx or bleeding or bruising.  Samples of rivaroxaban provided.   Written patient instructions provided.  Total time in face to face counseling 30 minutes.   Follow up PCP in 3-6 months; contact Pharmacy Clinic with medication access needs.   Patient seen with Acie Fredrickson, PharmD Candidate and Catie Darnelle Maffucci, PharmD,  PGY2 Pharmacy Resident.

## 2018-08-28 NOTE — Progress Notes (Signed)
Patient ID: Courtney Pena, female   DOB: 05-07-1940, 78 y.o.   MRN: [id number] Reviewed: I agree with Dr. Graylin Shiver documentation and management.

## 2018-08-29 LAB — CBC
Hematocrit: 31.8 % — ABNORMAL LOW (ref 34.0–46.6)
Hemoglobin: 9.3 g/dL — ABNORMAL LOW (ref 11.1–15.9)
MCH: 23.2 pg — ABNORMAL LOW (ref 26.6–33.0)
MCHC: 29.2 g/dL — ABNORMAL LOW (ref 31.5–35.7)
MCV: 79 fL (ref 79–97)
Platelets: 339 10*3/uL (ref 150–450)
RBC: 4.01 x10E6/uL (ref 3.77–5.28)
RDW: 17.3 % — ABNORMAL HIGH (ref 11.7–15.4)
WBC: 7.1 10*3/uL (ref 3.4–10.8)

## 2018-08-29 LAB — BASIC METABOLIC PANEL
BUN/Creatinine Ratio: 17 (ref 12–28)
BUN: 19 mg/dL (ref 8–27)
CO2: 21 mmol/L (ref 20–29)
Calcium: 10.1 mg/dL (ref 8.7–10.3)
Chloride: 101 mmol/L (ref 96–106)
Creatinine, Ser: 1.13 mg/dL — ABNORMAL HIGH (ref 0.57–1.00)
GFR calc Af Amer: 54 mL/min/{1.73_m2} — ABNORMAL LOW (ref >59)
GFR calc non Af Amer: 47 mL/min/{1.73_m2} — ABNORMAL LOW (ref >59)
Glucose: 176 mg/dL — ABNORMAL HIGH (ref 65–99)
Potassium: 4.6 mmol/L (ref 3.5–5.2)
Sodium: 135 mmol/L (ref 134–144)

## 2018-08-29 LAB — LDL CHOLESTEROL, DIRECT: LDL Direct: 181 mg/dL — ABNORMAL HIGH (ref 0–99)

## 2018-09-01 MED ORDER — ROSUVASTATIN CALCIUM 10 MG PO TABS: 10.0000 mg | ORAL_TABLET | ORAL | 3 refills | Status: DC

## 2018-09-01 NOTE — Addendum Note (Signed)
Addended by: [name] on: 09/01/2018 10:38 AM   Modules accepted: Orders

## 2018-09-01 NOTE — Assessment & Plan Note (Signed)
Told very high LDL.  She is willing to try 2x/week statin.

## 2018-09-02 ENCOUNTER — Telehealth: Payer: Self-pay | Admitting: Family Medicine

## 2018-09-02 MED ORDER — OZEMPIC (0.25 OR 0.5 MG/DOSE) 2 MG/1.5ML ~~LOC~~ SOPN: 0.2500 mg | PEN_INJECTOR | SUBCUTANEOUS | 0 refills | Status: DC

## 2018-09-02 NOTE — Telephone Encounter (Signed)
Noted and agree. 

## 2018-09-02 NOTE — Telephone Encounter (Signed)
Pt is calling and would like to speak with Dr. Valentina Lucks concerning the her medications and things that were discussed at her appointment on 08/28/18.  Best call back number is [phone].

## 2018-09-02 NOTE — Telephone Encounter (Signed)
Discussed her Gross income amount of $52,000 was in excess of the 400% poverty limit for single person in household.   After discussing options we decided to change from Victoza (liraglutide) to Ozempic (semaglutide).    Asked patient to come to office for sample later in the day.   3:55 - patient provide sample Medication Samples have been provided to the patient.  Drug name: Ozempic (semaglutide)        Qty: 2 pens LOT: [id number]  Exp.Date: 05/26/2020  Dosing instructions: inject 0.25mg  WEEKLY starting tomorrow 7/8 and same dose on 7/15 On 09/17/2018 we agreed that if there was minimal or no nausea (likely given tolerability of liraglutide 1.8mg  dose daily), we would increase to 0.5mg  WEEKLY.   Needles also supplied in sample box.   The patient has been instructed regarding the correct time, dose, and frequency of taking this medication, including desired effects and most common side effects.   Janeann Forehand 4:14 PM 09/02/2018

## 2018-09-08 ENCOUNTER — Ambulatory Visit: Payer: Self-pay | Admitting: Gynecology

## 2018-09-12 ENCOUNTER — Other Ambulatory Visit: Payer: Self-pay

## 2018-09-12 ENCOUNTER — Ambulatory Visit
Admission: RE | Admit: 2018-09-12 | Discharge: 2018-09-12 | Disposition: A | Discharge status: Home / Self Care | Payer: Medicare Other | Source: Ambulatory Visit | Attending: Family Medicine | Admitting: Family Medicine

## 2018-09-12 DIAGNOSIS — Z1231 Encounter for screening mammogram for malignant neoplasm of breast: Secondary | ICD-10-CM

## 2018-09-18 ENCOUNTER — Encounter: Payer: Self-pay | Admitting: Podiatry

## 2018-09-18 ENCOUNTER — Ambulatory Visit (INDEPENDENT_AMBULATORY_CARE_PROVIDER_SITE_OTHER): Payer: Medicare Other

## 2018-09-18 ENCOUNTER — Other Ambulatory Visit: Payer: Self-pay

## 2018-09-18 ENCOUNTER — Ambulatory Visit: Payer: Medicare Other | Admitting: Podiatry

## 2018-09-18 VITALS — BP 131/65 | HR 86 | Temp 97.2°F | Resp 16

## 2018-09-18 DIAGNOSIS — E1142 Type 2 diabetes mellitus with diabetic polyneuropathy: Secondary | ICD-10-CM | POA: Diagnosis not present

## 2018-09-18 MED ORDER — PREGABALIN 75 MG PO CAPS: 75.0000 mg | ORAL_CAPSULE | Freq: Two times a day (BID) | ORAL | 1 refills | Status: DC

## 2018-09-18 NOTE — Progress Notes (Signed)
Subjective:  Patient ID: Courtney Pena, female    DOB: 1941-01-30,  MRN: [id number] HPI Chief Complaint  Patient presents with  . Diabetes    Patient concerned about neuropathy-burning and numbness, skin cracking, tried Gold Bond lotion helped, gout flares intermittent, a1c was 8.9  . New Patient (Initial Visit)    78 y.o. female presents with the above complaint.    ROS: Denies fever chills nausea vomiting muscle aches pains calf pain back pain chest pain shortness of breath.  Past Medical History:  Diagnosis Date  . Allergy   . Anemia due to GI blood loss 08/12/2011  . Arthritis   . Atrial flutter ([place]) 12-07-2010   converted in ED with 300 mg flecainide  . CAD (coronary artery disease)    a. mild per cath in 2004;  b. nonischemic Myoview in March 2012;  c. Lex MV 1/14:  EF 66%, no ischemia  . Cataract   . Chronic anticoagulation - Xarelto started 07/07/2015, CHADS2CVASC=5 07/07/2015  . Clotting disorder ([place])   . Diastolic CHF ([place]) 10/20/537  . External hemorrhoids 06/07/2010  . Gastric antral vascular ectasia    source for gi bleed in 07/2011 - Xarelto stopped  . Gastritis   . GERD (gastroesophageal reflux disease)   . Hiatal hernia   . Hyperlipidemia   . Hypertension   . Hypothyroidism   . Neuromuscular disorder ([place])   . Obesity   . Paroxysmal atrial fibrillation (H[facility])   . Personal history of colonic polyps 06/06/2009   cecal polyp  . Sleep apnea    does not use CPAP  . Type 2 diabetes mellitus with diabetic chronic kidney disease ([place]) 04/25/2006      . Ulcer    Past Surgical History:  Procedure Laterality Date  . BREAST BIOPSY Left   . BREAST EXCISIONAL BIOPSY Left    benign  . CARDIAC CATHETERIZATION  1999&2004  . CARPAL TUNNEL RELEASE Bilateral 2003  . CATARACT EXTRACTION Bilateral   . COLONOSCOPY    . ESOPHAGOGASTRODUODENOSCOPY  08/13/2011   Procedure: ESOPHAGOGASTRODUODENOSCOPY (EGD);  Surgeon: Gatha Mayer, MD;  Location: [facility]  ENDOSCOPY;  Service: Endoscopy;  Laterality: N/A;  . POLYPECTOMY      Current Outpatient Medications:  .  acetaminophen (TYLENOL) 650 MG CR tablet, Take 650 mg by mouth every 8 (eight) hours as needed for pain., Disp: , Rfl:  .  amLODipine (NORVASC) 5 MG tablet, Take 1 tablet (5 mg total) by mouth daily., Disp: 90 tablet, Rfl: 3 .  Cholecalciferol (VITAMIN D3) 2000 units TABS, Take 4 capsules by mouth daily. , Disp: , Rfl:  .  Coenzyme Q10 (COQ10) 100 MG CAPS, Take 100 mEq by mouth daily. , Disp: , Rfl:  .  colchicine 0.6 MG tablet, Take 0.6 mg by mouth as needed (gout flare ups)., Disp: , Rfl:  .  Diclofenac Sodium 3 % GEL, Apply to skin of arthritis joints. (Patient not taking: Reported on 08/28/2018), Disp: 100 g, Rfl: 6 .  diltiazem (CARDIZEM CD) 360 MG 24 hr capsule, TAKE 1 CAPSULE BY MOUTH  DAILY, Disp: 90 capsule, Rfl: 3 .  diltiazem (CARDIZEM) 30 MG tablet, Take 1 tablet (30 mg total) by mouth every 4 (four) hours as needed (elevated HR as long as BP > 100)., Disp: 30 tablet, Rfl: 6 .  ferrous sulfate 325 (65 FE) MG EC tablet, Take 650 mg by mouth daily. , Disp: , Rfl:  .  furosemide (LASIX) 20 MG tablet, Take 20 mg by  mouth daily as needed. , Disp: , Rfl:  .  glipiZIDE (GLUCOTROL XL) 10 MG 24 hr tablet, Take 1 tablet (10 mg total) by mouth 2 (two) times daily., Disp: 180 tablet, Rfl: 3 .  Insulin Pen Needle 31G X 8 MM MISC, Use with pen to inject once daily., Disp: 100 each, Rfl: 3 .  Lancets (ONETOUCH ULTRASOFT) lancets, Use to check blood sugar daily., Disp: 100 each, Rfl: 3 .  levothyroxine (SYNTHROID, LEVOTHROID) 112 MCG tablet, TAKE 1 TABLET BY MOUTH  DAILY, Disp: 90 tablet, Rfl: 3 .  meclizine (ANTIVERT) 25 MG tablet, Take 25 mg by mouth 3 (three) times daily as needed for dizziness or nausea., Disp: , Rfl:  .  metFORMIN (GLUCOPHAGE-XR) 500 MG 24 hr tablet, TAKE 4 TABLETS BY MOUTH  DAILY WITH BREAKFAST, Disp: 360 tablet, Rfl: 3 .  metoprolol succinate (TOPROL-XL) 100 MG 24 hr  tablet, Take 1 tablet (100 mg total) by mouth 2 (two) times daily. Take with or immediately following a meal., Disp: 180 tablet, Rfl: 3 .  Multiple Vitamin (MULTI-VITAMINS) TABS, Take daily by mouth., Disp: , Rfl:  .  nitroGLYCERIN (NITROSTAT) 0.4 MG SL tablet, Place 1 tablet (0.4 mg total) under the tongue every 5 (five) minutes as needed for chest pain (Call 911 if chest pain after three doses). (Patient not taking: Reported on 05/12/2018), Disp: 25 tablet, Rfl: 3 .  omeprazole (PRILOSEC) 40 MG capsule, TAKE 1 CAPSULE BY MOUTH  DAILY, Disp: 90 capsule, Rfl: 3 .  ONE TOUCH ULTRA TEST test strip, TEST 3 TIMES DAILY, Disp: 300 each, Rfl: 3 .  pregabalin (LYRICA) 75 MG capsule, Take 1 capsule (75 mg total) by mouth 2 (two) times daily., Disp: 60 capsule, Rfl: 1 .  rivaroxaban (XARELTO) 20 MG TABS tablet, Take 1 tablet (20 mg total) by mouth daily with supper., Disp: 42 tablet, Rfl: 0 .  rosuvastatin (CRESTOR) 10 MG tablet, Take 1 tablet (10 mg total) by mouth 2 (two) times a week., Disp: 26 tablet, Rfl: 3 .  Semaglutide,0.25 or 0.5MG /DOS, (OZEMPIC, 0.25 OR 0.5 MG/DOSE,) 2 MG/1.5ML SOPN, Inject 0.25 mg into the skin once a week., Disp: 2 pen, Rfl: 0 .  triamcinolone cream (KENALOG) 0.1 %, Apply 1 application topically as needed (dermatitis)., Disp: , Rfl:   Allergies  Allergen Reactions  . Lisinopril Swelling    REACTION: swelling, may have had some breathing involvement.   . Nsaids Other (See Comments)    Patient reports internal bleeding  . Penicillins Shortness Of Breath and Swelling    Arm Swelling with Penicillin (Occurred in 1960s) Breathing - throat swelling with Amoxicillin (Occurred prior to 2002)  . Sulfamethoxazole Hives and Itching    "welps all over" immediately after dose  . Tolmetin Other (See Comments)    Patient reports internal bleeding  . Actos [Pioglitazone] Swelling    REACTION: swelling all over body  . Flecainide Other (See Comments)    Blurry  vision  . Gabapentin  Other (See Comments)    Caused dysphoria   . Iodinated Diagnostic Agents Itching    Pt. Developed mild itching after receiving IV cm; pt. Held; Dr. Keane Scrape recomended she take 50 mg of benadryl when she goes home-if necessary; Dr Mickey Farber recommends benadryl prior to future exams requiring contrast media, but stated other doctors may recommend another premedication prep.  . Statins Other (See Comments)    Muscle aches with multiple statins  . Losartan Potassium Other (See Comments)    Lower extremity swelling  .  Zetia [Ezetimibe] Other (See Comments)    Cramps   Review of Systems Objective:   Vitals:   09/18/18 1325  BP: 131/65  Pulse: 86  Resp: 16  Temp: (!) 97.2 F (36.2 C)    General: Well developed, nourished, in no acute distress, alert and oriented x3   Dermatological: Skin is warm, dry and supple bilateral. Nails x 10 are well maintained; remaining integument appears unremarkable at this time. There are no open sores, no preulcerative lesions, no rash or signs of infection present.  Vascular: Dorsalis Pedis artery and Posterior Tibial artery pedal pulses are 2/4 bilateral with immedate capillary fill time. Pedal hair growth present. No varicosities and no lower extremity edema present bilateral.   Neruologic: Grossly intact via light touch bilateral. Vibratory intact via tuning fork bilateral. Protective threshold with Semmes Wienstein monofilament intact to all pedal sites bilateral. Patellar and Achilles deep tendon reflexes 2+ bilateral. No Babinski or clonus noted bilateral.   Musculoskeletal: No gross boney pedal deformities bilateral. No pain, crepitus, or limitation noted with foot and ankle range of motion bilateral. Muscular strength 5/5 in all groups tested bilateral.  She has severe flat feet with osteoarthritis diabetic peripheral neuropathy hammertoe deformity and hallux valgus deformity.  Gait: Unassisted, Nonantalgic.    Radiographs:  Radiographs taken  today demonstrate significant osteoarthritic changes throughout the entire foot with pes planus.  Assessment & Plan:   Assessment: Osteoarthritis of the entire foot bilaterally.  Dry cracked fissured skin with diabetic peripheral neuropathy.  Plan: Started her on Lyrica 75 mg 1 p.o. twice daily.  Started her on O'Keefe's hand-and-foot cream.  Schedule with Liliane Channel for diabetic shoes.  Follow-up with me in 1 month for med check.     Max T. [place], [place]

## 2018-09-25 ENCOUNTER — Other Ambulatory Visit: Payer: Self-pay

## 2018-09-25 ENCOUNTER — Ambulatory Visit: Payer: Medicare Other | Admitting: Orthotics

## 2018-09-25 DIAGNOSIS — E1142 Type 2 diabetes mellitus with diabetic polyneuropathy: Secondary | ICD-10-CM

## 2018-09-25 NOTE — Progress Notes (Signed)

## 2018-09-29 ENCOUNTER — Other Ambulatory Visit: Payer: Self-pay

## 2018-09-30 ENCOUNTER — Ambulatory Visit (INDEPENDENT_AMBULATORY_CARE_PROVIDER_SITE_OTHER): Payer: Medicare Other | Admitting: Obstetrics & Gynecology

## 2018-09-30 ENCOUNTER — Encounter: Payer: Self-pay | Admitting: Obstetrics & Gynecology

## 2018-09-30 VITALS — BP 132/78 | Ht 64.75 in | Wt 229.0 lb

## 2018-09-30 DIAGNOSIS — N816 Rectocele: Secondary | ICD-10-CM | POA: Diagnosis not present

## 2018-09-30 DIAGNOSIS — Z01419 Encounter for gynecological examination (general) (routine) without abnormal findings: Secondary | ICD-10-CM

## 2018-09-30 DIAGNOSIS — Z13820 Encounter for screening for osteoporosis: Secondary | ICD-10-CM | POA: Diagnosis not present

## 2018-09-30 DIAGNOSIS — Z780 Asymptomatic menopausal state: Secondary | ICD-10-CM | POA: Diagnosis not present

## 2018-09-30 DIAGNOSIS — E6609 Other obesity due to excess calories: Secondary | ICD-10-CM

## 2018-09-30 DIAGNOSIS — Z6838 Body mass index (BMI) 38.0-38.9, adult: Secondary | ICD-10-CM

## 2018-09-30 DIAGNOSIS — Z124 Encounter for screening for malignant neoplasm of cervix: Secondary | ICD-10-CM

## 2018-09-30 NOTE — Progress Notes (Signed)
[place] 1940-06-04 [id number]   History:    78 y.o. [id number]  RP:  New patient presenting for annual gyn exam   HPI: Menopausal for many years on no hormone replacement therapy.  No postmenopausal bleeding.  No pelvic pain.  Has felt a little bulge in the vagina when pushing for bowel movements.  Not associated with pain.  Does not need to push vaginally with her finger to pass bowel movements.  Urine normal.  No stress urinary incontinence.  Breasts normal.  Body mass index 38.48.  Not very active physically.  Health labs with family physician.  Colonoscopy January 2020.  Past medical history,surgical history, family history and social history were all reviewed and documented in the EPIC chart.  Gynecologic History No LMP recorded. Patient is postmenopausal. Contraception: post menopausal status Last Pap: 10/2005. Results were: Negative Last mammogram: 08/2018. Results were: Negative Bone Density: 09/2014 Normal Colonoscopy: 02/2018  Obstetric History OB History  Gravida Para Term Preterm AB Living  6 4     2 4   SAB TAB Ectopic Multiple Live Births  2            # Outcome Date GA Lbr Len/2nd Weight Sex Delivery Anes PTL Lv  6 SAB           5 SAB           4 Para           3 Para           2 Para           1 Para              ROS: A ROS was performed and pertinent positives and negatives are included in the history.  GENERAL: No fevers or chills. HEENT: No change in vision, no earache, sore throat or sinus congestion. NECK: No pain or stiffness. CARDIOVASCULAR: No chest pain or pressure. No palpitations. PULMONARY: No shortness of breath, cough or wheeze. GASTROINTESTINAL: No abdominal pain, nausea, vomiting or diarrhea, melena or bright red blood per rectum. GENITOURINARY: No urinary frequency, urgency, hesitancy or dysuria. MUSCULOSKELETAL: No joint or muscle pain, no back pain, no recent trauma. DERMATOLOGIC: No rash, no itching, no lesions. ENDOCRINE:  No polyuria, polydipsia, no heat or cold intolerance. No recent change in weight. HEMATOLOGICAL: No anemia or easy bruising or bleeding. NEUROLOGIC: No headache, seizures, numbness, tingling or weakness. PSYCHIATRIC: No depression, no loss of interest in normal activity or change in sleep pattern.     Exam:   BP 132/78   Ht 5' 4.75" (1.645 m)   Wt 229 lb (103.9 kg)   BMI 38.40 kg/m   Body mass index is 38.4 kg/m.  General appearance : Well developed well nourished female. No acute distress HEENT: Eyes: no retinal hemorrhage or exudates,  Neck supple, trachea midline, no carotid bruits, no thyroidmegaly Lungs: Clear to auscultation, no rhonchi or wheezes, or rib retractions  Heart: Regular rate and rhythm, no murmurs or gallops Breast:Examined in sitting and supine position were symmetrical in appearance, no palpable masses or tenderness,  no skin retraction, no nipple inversion, no nipple discharge, no skin discoloration, no axillary or supraclavicular lymphadenopathy Abdomen: no palpable masses or tenderness, no rebound or guarding Extremities: no edema or skin discoloration or tenderness  Pelvic: Vulva: Normal             Vagina: No gross lesions or discharge.  Grade 2 Rectocele in standing position with Valsalva.  Cervix: No gross lesions or discharge.  Pap reflex done.  Uterus  AV, normal size, shape and consistency, non-tender and mobile  Adnexa  Without masses or tenderness  Anus: Normal   Assessment/Plan:  78 y.o. female for annual exam   1. Encounter for routine gynecological examination with Papanicolaou smear of cervix Gynecologic exam in menopause with a grade 2/3 rectocele.  Pap reflex done today.  Breast exam normal.  Screening mammogram July 2020 was negative.  Colonoscopy January 2020.  Health labs with family physician.  2. Postmenopause Well on no hormone replacement therapy.  No postmenopausal bleeding.  3. Screening for osteoporosis Follow-up here for a bone  density.  Vitamin D supplements, calcium intake of 1200 mg daily, regular weightbearing physical activities. - DG Bone Density; Future  4. Baden-Walker grade 2 rectocele ,Minimally symptomatic grade 2 out of 3 rectocele.  Recommendation to keep her stools soft.  Avoid pushing for bowel movements.  Avoid lifting heavy weights.  No indication for pessary or surgery at this time, will observe.  5. Class 2 obesity due to excess calories without serious comorbidity with body mass index (BMI) of 38.0 to 38.9 in adult Recommend a lower calorie/carb diet such as [employer].  Aerobic physical activities 5 times a week and weightlifting every 2 days.  Princess Bruins MD, 2:56 PM 09/30/2018

## 2018-10-01 ENCOUNTER — Telehealth (INDEPENDENT_AMBULATORY_CARE_PROVIDER_SITE_OTHER): Payer: Medicare Other | Admitting: Family Medicine

## 2018-10-01 ENCOUNTER — Telehealth: Payer: Self-pay

## 2018-10-01 ENCOUNTER — Other Ambulatory Visit: Payer: Self-pay

## 2018-10-01 ENCOUNTER — Telehealth: Payer: Self-pay | Admitting: Family Medicine

## 2018-10-01 ENCOUNTER — Ambulatory Visit: Payer: Medicare Other | Admitting: Nurse Practitioner

## 2018-10-01 DIAGNOSIS — J988 Other specified respiratory disorders: Secondary | ICD-10-CM | POA: Diagnosis not present

## 2018-10-01 DIAGNOSIS — B9789 Other viral agents as the cause of diseases classified elsewhere: Secondary | ICD-10-CM | POA: Diagnosis not present

## 2018-10-01 DIAGNOSIS — Z20822 Contact with and (suspected) exposure to covid-19: Secondary | ICD-10-CM

## 2018-10-01 LAB — PAP IG W/ RFLX HPV ASCU

## 2018-10-01 NOTE — Assessment & Plan Note (Addendum)
Symptoms are consistent with viral illness and are concerning for COVID-19 infection. Patient is part of a high risk population which I feel warrants testing for confirmation. Patient is currently feeling better than when symptom onset and is denying any SOB or fever.   [id number] for COVID testing placed.  Patient advised of 3 locations for drive up testing including:  [facility] (old [facility] in [place]) [place]. [place], [place] [address]  [place] ([place]) [address] [place], [place] [address]  [address] (near [place] in [place]) [place], [place] [address]  Patient advised of hours 8am-4pm and that they should be in line by 3 PM.  -She may use OTC tylenol for fever/discomfort, chloraseptic spray for sore throat. Instructed to maintain good hydration.  -ED precautions discussed and patient expressed good understanding -Patient counseled on wearing a mask, washing hands -Patient instructed to avoid others until she meets criteria for ending isolation after any suspected COVID, which are:  -3 days with no fever and  -Respiratory symptoms have improved (e.g. cough, shortness of breath) or  -10 days since symptoms first appeared

## 2018-10-01 NOTE — Telephone Encounter (Signed)
Dr. Tarry Kos saw in access to care and answered her questions.  They agreed that I did not need to call at this point.

## 2018-10-01 NOTE — Progress Notes (Signed)
[facility] Tele[facility] Visit  Patient consented to have virtual visit. Method of visit: Telephone  Encounter participants: Patient: Veretta Sabourin - located at home Provider: Danna Hefty - located at [facility] Others (if applicable): [facility]  Chief Complaint: Concern for COVID  HPI: Starting Monday evening she developed a headache, sore throat with difficulty swallowing, terrible taste in her mouth, physically weak, nausea, and difficult sleeping. These symptoms have somewhat improved since then. Her taste has returned normal. She does still have a minor headache and sore throat. She felt some chills on Monday but none currently. Denies fevers. She also endorses some SOB throughout the last night and some SOB with ambulation yesterday. She notes she is still having some SOB today but it is improved from yesterday. She isnt struggling to breath but doesn't feel normal for her. Denies any body aches, but does still feel weak. She felt like her sugar had dropped but when she checked her sugar and it was 192. Occasional cough, a little wet. Denies runny nose, itchy eyes. No sinus pressure. Denies any sick contacts or recent travel. She is still working, she works at [employer]  Denies any chest pain. Able to eat okay without any nausea/vomiting currently.  Notes she was previously tested in June 2020 due to a work related exposure without any symptoms and she was negative at that time.   ROS: per HPI  Pertinent PMHx: Allergies  Exam:  Respiratory: Speaking in full sentences without difficulty  Assessment/Plan:  Viral respiratory illness Symptoms are consistent with viral illness and are concerning for COVID-19 infection. Patient is part of a high risk population which I feel warrants testing for confirmation. Patient is currently feeling better than when symptom onset and is denying any SOB or fever.   [id number] for COVID testing placed.   Patient advised of 3 locations for drive up testing including:  [facility] (old [facility] in [place]) [place]. [place], [place] [address]  [place] ([place]) [address] [place], [place] [address]  [address] (near [place] in [place]) [place], [place] [address]  Patient advised of hours 8am-4pm and that they should be in line by 3 PM.  -She may use OTC tylenol for fever/discomfort, chloraseptic spray for sore throat. Instructed to maintain good hydration.  -ED precautions discussed and patient expressed good understanding -Patient counseled on wearing a mask, washing hands -Patient instructed to avoid others until she meets criteria for ending isolation after any suspected COVID, which are:  -3 days with no fever and  -Respiratory symptoms have improved (e.g. cough, shortness of breath) or  -10 days since symptoms first appeared   Time spent during visit with patient: 20 minutes   Mina Marble, DO  [place], PGY2

## 2018-10-01 NOTE — Telephone Encounter (Signed)
Pt called to inform her Pcp that she is experiencing Covid symptoms, and would like to speak with Dr.Hensel, pt has virtual apt today at 11.

## 2018-10-01 NOTE — Telephone Encounter (Signed)
[id number] - [place], [place] - [address]  135/ [address]  229LB  No fever  Pt gave consent to telephone visit. Salvatore Marvel, CMA

## 2018-10-02 LAB — NOVEL CORONAVIRUS, NAA: SARS-CoV-2, NAA: NOT DETECTED

## 2018-10-03 NOTE — Telephone Encounter (Signed)
Spoke with patient - she states she was denied from [employer] due to income (~$60,000 for 1 person in household) - she was working part time and will be through the rest of this year.  She has since restarted rosuvastatin 10mg  3-4 days per week after LDL was elevated at 181 last month. Will have labs checked in another 1-2 months by PCP. Advised pt that if she continues to work less to let us know as we might be able to reapply for a [employer] if her annual income decreases so that she could start on the Repatha shots.

## 2018-10-06 ENCOUNTER — Encounter: Payer: Self-pay | Admitting: Obstetrics & Gynecology

## 2018-10-06 NOTE — Patient Instructions (Signed)
1. Encounter for routine gynecological examination with Papanicolaou smear of cervix Gynecologic exam in menopause with a grade 2/3 rectocele.  Pap reflex done today.  Breast exam normal.  Screening mammogram July 2020 was negative.  Colonoscopy January 2020.  Health labs with family physician.  2. Postmenopause Well on no hormone replacement therapy.  No postmenopausal bleeding.  3. Screening for osteoporosis Follow-up here for a bone density.  Vitamin D supplements, calcium intake of 1200 mg daily, regular weightbearing physical activities. - DG Bone Density; Future  4. Baden-Walker grade 2 rectocele ,Minimally symptomatic grade 2 out of 3 rectocele.  Recommendation to keep her stools soft.  Avoid pushing for bowel movements.  Avoid lifting heavy weights.  No indication for pessary or surgery at this time, will observe.  5. Class 2 obesity due to excess calories without serious comorbidity with body mass index (BMI) of 38.0 to 38.9 in adult Recommend a lower calorie/carb diet such as [employer].  Aerobic physical activities 5 times a week and weightlifting every 2 days.  Tamitha, it was a pleasure meeting you today!  I will inform you of your results as soon as they are available.

## 2018-10-14 DIAGNOSIS — H43822 Vitreomacular adhesion, left eye: Secondary | ICD-10-CM | POA: Insufficient documentation

## 2018-10-14 DIAGNOSIS — H348312 Tributary (branch) retinal vein occlusion, right eye, stable: Secondary | ICD-10-CM | POA: Insufficient documentation

## 2018-10-14 DIAGNOSIS — H35033 Hypertensive retinopathy, bilateral: Secondary | ICD-10-CM | POA: Insufficient documentation

## 2018-10-20 ENCOUNTER — Other Ambulatory Visit: Payer: Self-pay | Admitting: Cardiovascular Disease

## 2018-10-20 DIAGNOSIS — I480 Paroxysmal atrial fibrillation: Secondary | ICD-10-CM

## 2018-10-20 DIAGNOSIS — I10 Essential (primary) hypertension: Secondary | ICD-10-CM

## 2018-10-21 ENCOUNTER — Other Ambulatory Visit: Payer: Self-pay | Admitting: *Deleted

## 2018-10-21 DIAGNOSIS — I10 Essential (primary) hypertension: Secondary | ICD-10-CM

## 2018-10-21 DIAGNOSIS — I480 Paroxysmal atrial fibrillation: Secondary | ICD-10-CM

## 2018-10-21 NOTE — Progress Notes (Signed)
[facility] Note  10/22/2018     CHIEF COMPLAINT Patient presents for Retina Evaluation and Diabetic Eye Exam   HISTORY OF PRESENT ILLNESS: Courtney Pena is a 78 y.o. female who presents to the clinic today for:   HPI    Retina Evaluation    In both eyes.  This started 1 month ago.  Associated Symptoms Floaters.  Negative for Flashes, Pain, Trauma, Weight Loss, Scalp Tenderness, Redness, Distortion, Photophobia, Jaw Claudication, Fatigue, Blind Spot, Glare, Shoulder/Hip pain and Fever.  Context:  distance vision, mid-range vision and near vision.  Treatments tried include artificial tears.  I, the attending physician,  performed the HPI with the patient and updated documentation appropriately.          Diabetic Eye Exam    Vision is stable.  Associated Symptoms Negative for Flashes, Pain, Trauma, Fever, Floaters, Redness, Scalp Tenderness, Weight Loss, Distortion, Photophobia, Jaw Claudication, Fatigue, Blind Spot, Glare and Shoulder/Hip pain.  Diabetes characteristics include Type 2 and on insulin.  This started 25 years ago.  Blood sugar level fluctuates.  Last Blood Glucose 171.  Last A1C 8.  I, the attending physician,  performed the HPI with the patient and updated documentation appropriately.          Comments    Referral of Dr. Manuella Ghazi for DME. Patient states Dr. Manuella Ghazi told her she had a stroke OD but she has not noticed any visual issues, she reports having floaters ou for years, and she had a stye OD week ago that caused her eye pain. Pt is DM2 X25 yrs BS 171(10/21/2018)A1C 8. 2-3 mos ago, Bs fluctuates , pt is on insulin. Patient reports she is using visine gtt's for allergys.       Last edited by Bernarda Caffey, MD on 10/22/2018  5:22 PM. (History)    pt states she has been a pt at [facility] for [place], she states she saw Dr. Edilia Bo, she states the Dr at [facility] told her she had a problem with her eyes and referred  her back to Dr. Manuella Ghazi, pt states it's been a year since she saw [facility], she states Dr. Manuella Ghazi told her she has had a "stroke" in her right eye, pt states she was not aware she had a problem with her vision, pt states she has A Fib and is on Xarelto  Referring physician: Feliz Beam, MD [place],  [place] [address]  HISTORICAL INFORMATION:   Selected notes from the MEDICAL RECORD NUMBER Referred by Dr. Dwana Melena for FA LEE: 08.18.20 Matilde Bash) [BCVA: OD: 20/25 OS: 20/30--] Ocular Hx-NPDR OU, BRVO OD, VMT OS, pseudo OU PMH-DM (A1c: 8.5, takes metformin, ozempic), HTN   CURRENT MEDICATIONS: No current outpatient medications on file. (Ophthalmic Drugs)   No current facility-administered medications for this visit.  (Ophthalmic Drugs)   Current Outpatient Medications (Other)  Medication Sig  . acetaminophen (TYLENOL) 650 MG CR tablet Take 650 mg by mouth every 8 (eight) hours as needed for pain.  Marland Kitchen amLODipine (NORVASC) 5 MG tablet Take 1 tablet (5 mg total) by mouth daily.  . Cholecalciferol (VITAMIN D3) 2000 units TABS Take 4 capsules by mouth daily.   . Coenzyme Q10 (COQ10) 100 MG CAPS Take 100 mEq by mouth daily.   . colchicine 0.6 MG tablet Take 0.6 mg by mouth as needed (gout flare ups).  . Diclofenac Sodium 3 % GEL Apply to skin of arthritis  joints.  . diltiazem (CARDIZEM CD) 360 MG 24 hr capsule TAKE 1 CAPSULE BY MOUTH  DAILY  . diltiazem (CARDIZEM) 30 MG tablet Take 1 tablet (30 mg total) by mouth every 4 (four) hours as needed (elevated HR as long as BP > 100).  . ferrous sulfate 325 (65 FE) MG EC tablet Take 650 mg by mouth daily.   . furosemide (LASIX) 20 MG tablet Take 20 mg by mouth daily as needed.   Marland Kitchen glipiZIDE (GLUCOTROL XL) 10 MG 24 hr tablet Take 1 tablet (10 mg total) by mouth 2 (two) times daily.  . Insulin Pen Needle 31G X 8 MM MISC Use with pen to inject once daily.  . Lancets (ONETOUCH ULTRASOFT) lancets Use to check blood sugar daily.  Marland Kitchen  levothyroxine (SYNTHROID, LEVOTHROID) 112 MCG tablet TAKE 1 TABLET BY MOUTH  DAILY  . meclizine (ANTIVERT) 25 MG tablet Take 25 mg by mouth 3 (three) times daily as needed for dizziness or nausea.  . metFORMIN (GLUCOPHAGE-XR) 500 MG 24 hr tablet TAKE 4 TABLETS BY MOUTH  DAILY WITH BREAKFAST  . metoprolol succinate (TOPROL-XL) 100 MG 24 hr tablet Take 1 tablet (100 mg total) by mouth 2 (two) times daily. Take with or immediately following a meal.  . Multiple Vitamin (MULTI-VITAMINS) TABS Take daily by mouth.  . nitroGLYCERIN (NITROSTAT) 0.4 MG SL tablet Place 1 tablet (0.4 mg total) under the tongue every 5 (five) minutes as needed for chest pain (Call 911 if chest pain after three doses).  Marland Kitchen omeprazole (PRILOSEC) 40 MG capsule TAKE 1 CAPSULE BY MOUTH  DAILY  . ONE TOUCH ULTRA TEST test strip TEST 3 TIMES DAILY  . pregabalin (LYRICA) 75 MG capsule Take 1 capsule (75 mg total) by mouth 2 (two) times daily.  . rivaroxaban (XARELTO) 20 MG TABS tablet Take 1 tablet (20 mg total) by mouth daily with supper.  . rosuvastatin (CRESTOR) 10 MG tablet Take 1 tablet (10 mg total) by mouth 2 (two) times a week.  . Semaglutide,0.25 or 0.5MG /DOS, (OZEMPIC, 0.25 OR 0.5 MG/DOSE,) 2 MG/1.5ML SOPN Inject 0.25 mg into the skin once a week.  . triamcinolone cream (KENALOG) 0.1 % Apply 1 application topically as needed (dermatitis).   No current facility-administered medications for this visit.  (Other)      REVIEW OF SYSTEMS: ROS    Positive for: Endocrine, Eyes   Negative for: Constitutional, Gastrointestinal, Neurological, Skin, Genitourinary, Musculoskeletal, HENT, Cardiovascular, Respiratory, Psychiatric, Allergic/Imm, Heme/Lymph   Last edited by Zenovia Jordan, LPN on D34-534  D34-534 AM. (History)       ALLERGIES Allergies  Allergen Reactions  . Lisinopril Swelling    REACTION: swelling, may have had some breathing involvement.   . Nsaids Other (See Comments)    Patient reports internal bleeding   . Penicillins Shortness Of Breath and Swelling    Arm Swelling with Penicillin (Occurred in 1960s) Breathing - throat swelling with Amoxicillin (Occurred prior to 2002)  . Sulfamethoxazole Hives and Itching    "welps all over" immediately after dose  . Tolmetin Other (See Comments)    Patient reports internal bleeding  . Actos [Pioglitazone] Swelling    REACTION: swelling all over body  . Flecainide Other (See Comments)    Blurry  vision  . Gabapentin Other (See Comments)    Caused dysphoria   . Iodinated Diagnostic Agents Itching    Pt. Developed mild itching after receiving IV cm; pt. Held; Dr. Keane Scrape recomended she take 50 mg of benadryl when  she goes home-if necessary; Dr Mickey Farber recommends benadryl prior to future exams requiring contrast media, but stated other doctors may recommend another premedication prep.  . Statins Other (See Comments)    Muscle aches with multiple statins  . Losartan Potassium Other (See Comments)    Lower extremity swelling  . Zetia [Ezetimibe] Other (See Comments)    Cramps    PAST MEDICAL HISTORY Past Medical History:  Diagnosis Date  . Allergy   . Anemia due to GI blood loss 08/12/2011  . Arthritis   . Atrial flutter ([place]) 12-07-2010   converted in ED with 300 mg flecainide  . CAD (coronary artery disease)    a. mild per cath in 2004;  b. nonischemic Myoview in March 2012;  c. Lex MV 1/14:  EF 66%, no ischemia  . Cataract   . Chronic anticoagulation - Xarelto started 07/07/2015, CHADS2CVASC=5 07/07/2015  . Clotting disorder ([place])   . Diastolic CHF ([place]) [id number]  . External hemorrhoids 06/07/2010  . Gastric antral vascular ectasia    source for gi bleed in 07/2011 - Xarelto stopped  . Gastritis   . GERD (gastroesophageal reflux disease)   . Hiatal hernia   . Hyperlipidemia   . Hypertension   . Hypothyroidism   . Neuromuscular disorder ([place])   . Obesity   . Paroxysmal atrial fibrillation (H[facility])   . Personal history of colonic  polyps 06/06/2009   cecal polyp  . Sleep apnea    does not use CPAP  . Type 2 diabetes mellitus with diabetic chronic kidney disease ([place]) 04/25/2006      . Ulcer    Past Surgical History:  Procedure Laterality Date  . BREAST BIOPSY Left   . BREAST EXCISIONAL BIOPSY Left    benign  . CARDIAC CATHETERIZATION  1999&2004  . CARPAL TUNNEL RELEASE Bilateral 2003  . CATARACT EXTRACTION Bilateral   . COLONOSCOPY    . ESOPHAGOGASTRODUODENOSCOPY  08/13/2011   Procedure: ESOPHAGOGASTRODUODENOSCOPY (EGD);  Surgeon: Gatha Mayer, MD;  Location[facility] END[facility]COPY;  Service: Endoscopy;  Laterality: N/A;  . POLYPECTOMY      FAMILY HISTORY Family History  Problem Relation Age of Onset  . Heart disease Father   . Diabetes Maternal Grandfather   . Hypertension Mother   . Other Mother        brain tumor-benign  . Diabetes Daughter        pre-diabeties  . Colon polyps Daughter   . Thyroid disease Daughter        x 2  . Colon cancer Neg Hx   . Esophageal cancer Neg Hx   . Stomach cancer Neg Hx   . Rectal cancer Neg Hx     SOCIAL HISTORY Social History   Tobacco Use  . Smoking status: Former Smoker    Packs/day: 2.00    Years: 30.00    Pack years: 60.00    Types: Cigarettes    Quit date: 02/26/1993    Years since quitting: 25.6  . Smokeless tobacco: Never Used  Substance Use Topics  . Alcohol use: No    Alcohol/week: 0.0 standard drinks  . Drug use: No         OPHTHALMIC EXAM:  Base Eye Exam    Visual Acuity (Snellen - Linear)      Right Left   Dist cc 20/20 -1 20/25 +2   Dist ph cc NI    Correction: Glasses       Tonometry (Tonopen, 9:37 AM)  Right Left   Pressure 14 16       Pupils      Dark Light Shape React APD   Right 3 2 Round Brisk None   Left 3 2 Round Brisk None       Visual Fields (Counting fingers)      Left Right    Full Full       Extraocular Movement      Right Left    Full, Ortho Full, Ortho       Neuro/Psych    Oriented x3: Yes        Dilation    Both eyes: 1.0% Mydriacyl, 2.5% Phenylephrine @ 9:32 AM        Slit Lamp and Fundus Exam    Slit Lamp Exam      Right Left   Lids/Lashes 4 upper lid pappilomas Dermatochalasis - upper lid, Meibomian gland dysfunction   Conjunctiva/Sclera mild Melanosis mild Melanosis   Cornea trace Punctate epithelial erosions, Well healed cataract wounds, Arcus trace Punctate epithelial erosions, Well healed cataract wounds, Arcus   Anterior Chamber Deep and quiet Deep and quiet   Iris Round and dilated, No NVI Round and dilated, No NVI   Lens 3 piece PC IOL in good position with open PC 3 piece PC IOL in good position with open PC   Vitreous Vitreous syneresis Vitreous syneresis       Fundus Exam      Right Left   Disc compact, Pink and Sharp compact, Pink and Sharp   C/D Ratio 0.3 0.3   Macula Flat, Blunted foveal reflex, scattered Exudates superior macula, superior telangiectatic vessels Flat, Blunted foveal reflex, mild Retinal pigment epithelial mottling, No heme or edema   Vessels Tortuous, Dilated venules superior greater than inferior--superior BRVO,  telangiectatic vessels superior macula Vascular attenuation, Tortuous, Copper wiring, AV crossing changes   Periphery Attached, mild MA superior macula    Attached; scattered MA          IMAGING AND PROCEDURES  Imaging and Procedures for @TODAY @  OCT, Retina - OU - Both Eyes       Right Eye Quality was good. Central Foveal Thickness: 292. Progression has no prior data. Findings include normal foveal contour, intraretinal fluid, intraretinal hyper-reflective material, no SRF (+IRF/IRHM superior macula, partial PVD).   Left Eye Quality was good. Central Foveal Thickness: 296. Progression has no prior data. Findings include abnormal foveal contour, intraretinal fluid, no SRF, vitreous traction (VMT with central cystic changes).   Notes *Images captured and stored on drive  Diagnosis / Impression:  OD: +IRF/IRHM superior  macula consisent with superior BRVO w/ CME OS: VMT with mild central cystic changes  Clinical management:  See below  Abbreviations: NFP - Normal foveal profile. CME - cystoid macular edema. PED - pigment epithelial detachment. IRF - intraretinal fluid. SRF - subretinal fluid. EZ - ellipsoid zone. ERM - epiretinal membrane. ORA - outer retinal atrophy. ORT - outer retinal tubulation. SRHM - subretinal hyper-reflective material        Fluorescein Angiography Optos (Transit OS)       Right Eye   Progression has no prior data. Early phase findings include microaneurysm, vascular perfusion defect (Peripheral capillary non-perfusion ST periphery). Mid/Late phase findings include microaneurysm, vascular perfusion defect, leakage.   Left Eye   Progression has no prior data. Early phase findings include microaneurysm, vascular perfusion defect. Mid/Late phase findings include microaneurysm, vascular perfusion defect, leakage.   Notes Images stored on drive;  Impression: Moderate NPDR OU w/ scattered late-leaking MA OU, mild peripheral capillary nonperfusion OU, no NV OU OD: mild superior BRVO                   ASSESSMENT/PLAN:    ICD-10-CM   1. Branch retinal vein occlusion of right eye with macular edema  H34.8310   2. Retinal edema  H35.81 OCT, Retina - OU - Both Eyes  3. Moderate nonproliferative diabetic retinopathy of both eyes without macular edema associated with type 2 diabetes mellitus ([place])  [id number]   4. Essential hypertension  I10   5. Hypertensive retinopathy of both eyes  H35.033 Fluorescein Angiography Optos (Transit OS)  6. Vitreomacular adhesion of left eye  H43.822   7. Pseudophakia of both eyes  Z96.1     1,2. BRVO OD - mild CME  - pt referred by Dr. Manuella Ghazi for FA  - The natural history of retinal vein occlusion and macular edema and treatment options including observation, laser photocoagulation, and intravitreal antiVEGF injection with Avastin and  Lucentis and Eylea and intravitreal injection of steroids with triamcinolone and Ozurdex and the complications of these procedures including loss of vision, infection, cataract, glaucoma, and retinal detachment were discussed with patient.  - BCVA 20/20 OD with minimal macular edema on OCT  - FA 8.26.20 shows scattered MA from NPDR, but no NV; mild peripheral capillary nonperfusion  - images shared with Dr. Manuella Ghazi  - discussed findings with patient  - recommend continued follow up with Dr. Manuella Ghazi as scheduled in 4 mos, sooner prn  - f/u here prn per Dr. Manuella Ghazi  3. Moderate nonproliferative diabetic retinopathy w/o DME, OU  - The incidence, risk factors for progression, natural history and treatment options for diabetic retinopathy were discussed with patient.    - The need for close monitoring of blood glucose, blood pressure, and serum lipids, avoiding cigarette or any type of tobacco, and the need for long term follow up was also discussed with patient.  - exam shows scattered MA -- mild  - FA (08.26.20) shows late leaking MA OU, mild peripheral capillary nonperfusion OU, no NV  - OCT without diabetic macular edema OU   - management per Dr. Manuella Ghazi  4,5. Hypertensive retinopathy OU - discussed importance of tight BP control - monitor  6. VMT w/ mild central cystic changes OS - asymptomatic, no metamorphopsia - under the expert management of Dr. Manuella Ghazi  7. Pseudophakia OU  - s/p CE/IOL OU -- 3-piece PCIOLs Gershon Crane and [place]?)  - IOLs in good position, stable   Ophthalmic Meds Ordered this visit:  No orders of the defined types were placed in this encounter.      Return if symptoms worsen or fail to improve.  There are no Patient Instructions on file for this visit.   Explained the diagnoses, plan, and follow up with the patient and they expressed understanding.  Patient expressed understanding of the importance of proper follow up care.   This document serves as a record of  services personally performed by Gardiner Sleeper, MD, PhD. It was created on their behalf by Ernest Mallick, OA, an ophthalmic assistant. The creation of this record is the provider's dictation and/or activities during the visit.    Electronically signed by: Ernest Mallick, OA  08.25.2020 5:41 PM    Gardiner Sleeper, M.D., Ph.D. Diseases & Surgery of the Retina and Vitreous [facility]  I have reviewed the above documentation for accuracy and  completeness, and I agree with the above. Gardiner Sleeper, M.D., Ph.D. 10/22/18 5:41 PM     Abbreviations: M myopia (nearsighted); A astigmatism; H hyperopia (farsighted); P presbyopia; Mrx spectacle prescription;  CTL contact lenses; OD right eye; OS left eye; OU both eyes  XT exotropia; ET esotropia; PEK punctate epithelial keratitis; PEE punctate epithelial erosions; DES dry eye syndrome; MGD meibomian gland dysfunction; ATs artificial tears; PFAT's preservative free artificial tears; [place] nuclear sclerotic cataract; PSC posterior subcapsular cataract; ERM epi-retinal membrane; PVD posterior vitreous detachment; RD retinal detachment; DM diabetes mellitus; DR diabetic retinopathy; NPDR non-proliferative diabetic retinopathy; PDR proliferative diabetic retinopathy; CSME clinically significant macular edema; DME diabetic macular edema; dbh dot blot hemorrhages; CWS cotton wool spot; POAG primary open angle glaucoma; C/D cup-to-disc ratio; HVF humphrey visual field; GVF goldmann visual field; OCT optical coherence tomography; IOP intraocular pressure; BRVO Branch retinal vein occlusion; CRVO central retinal vein occlusion; CRAO central retinal artery occlusion; BRAO branch retinal artery occlusion; RT retinal tear; SB scleral buckle; PPV pars plana vitrectomy; VH Vitreous hemorrhage; PRP panretinal laser photocoagulation; IVK intravitreal kenalog; VMT vitreomacular traction; MH Macular hole;  NVD neovascularization of the disc; NVE  neovascularization elsewhere; AREDS age related eye disease study; ARMD age related macular degeneration; POAG primary open angle glaucoma; EBMD epithelial/anterior basement membrane dystrophy; ACIOL anterior chamber intraocular lens; IOL intraocular lens; PCIOL posterior chamber intraocular lens; Phaco/IOL phacoemulsification with intraocular lens placement; [place] photorefractive keratectomy; LASIK laser assisted in situ keratomileusis; HTN hypertension; DM diabetes mellitus; COPD chronic obstructive pulmonary disease

## 2018-10-22 ENCOUNTER — Other Ambulatory Visit: Payer: Self-pay

## 2018-10-22 ENCOUNTER — Encounter (INDEPENDENT_AMBULATORY_CARE_PROVIDER_SITE_OTHER): Payer: Self-pay | Admitting: Ophthalmology

## 2018-10-22 ENCOUNTER — Ambulatory Visit (INDEPENDENT_AMBULATORY_CARE_PROVIDER_SITE_OTHER): Payer: Medicare Other | Admitting: Ophthalmology

## 2018-10-22 DIAGNOSIS — E113393 Type 2 diabetes mellitus with moderate nonproliferative diabetic retinopathy without macular edema, bilateral: Secondary | ICD-10-CM | POA: Diagnosis not present

## 2018-10-22 DIAGNOSIS — Z961 Presence of intraocular lens: Secondary | ICD-10-CM

## 2018-10-22 DIAGNOSIS — H34831 Tributary (branch) retinal vein occlusion, right eye, with macular edema: Secondary | ICD-10-CM | POA: Diagnosis not present

## 2018-10-22 DIAGNOSIS — H43822 Vitreomacular adhesion, left eye: Secondary | ICD-10-CM

## 2018-10-22 DIAGNOSIS — I10 Essential (primary) hypertension: Secondary | ICD-10-CM

## 2018-10-22 DIAGNOSIS — H3581 Retinal edema: Secondary | ICD-10-CM | POA: Diagnosis not present

## 2018-10-22 DIAGNOSIS — H35033 Hypertensive retinopathy, bilateral: Secondary | ICD-10-CM

## 2018-10-22 MED ORDER — METOPROLOL SUCCINATE ER 100 MG PO TB24: 100.0000 mg | ORAL_TABLET | Freq: Two times a day (BID) | ORAL | 3 refills | Status: DC

## 2018-10-23 ENCOUNTER — Other Ambulatory Visit: Payer: Self-pay

## 2018-10-23 ENCOUNTER — Telehealth (INDEPENDENT_AMBULATORY_CARE_PROVIDER_SITE_OTHER): Payer: Medicare Other | Admitting: Pharmacist

## 2018-10-23 DIAGNOSIS — E1122 Type 2 diabetes mellitus with diabetic chronic kidney disease: Secondary | ICD-10-CM | POA: Diagnosis not present

## 2018-10-23 MED ORDER — GLIPIZIDE ER 10 MG PO TB24: 10.0000 mg | ORAL_TABLET | Freq: Every day | ORAL | Status: DC

## 2018-10-23 NOTE — Progress Notes (Signed)
Phone Call to Patient for Medication Supply and DM management.   Reports doing well with blood sugar control.  Denies any low blood sugar readings.  Taking Ozempic 0.5mg   Once weekly without adverse effect.  Taking Glipizide 10mg  XL (2 tablets daily) - advised to reduce this to 1 tablet daily.   Provided samples for 2 month supply of Ozempic and 5 weeks supply of Xarelto 20mg .   Spent 10 minutes in encounter.    Patient plan PCP visit within the next 3-4 weeks.  Support with additional samples PRN at that time.

## 2018-10-23 NOTE — Progress Notes (Signed)
Reviewed: Agree with Dr. Koval's documentation and management. 

## 2018-10-23 NOTE — Patient Instructions (Signed)
Continue Ozempic and Xarelto as previous.   Reduce Glipizide to 10mg  XL ONCE daily.

## 2018-10-28 ENCOUNTER — Ambulatory Visit (INDEPENDENT_AMBULATORY_CARE_PROVIDER_SITE_OTHER): Payer: Medicare Other | Admitting: Podiatry

## 2018-10-28 ENCOUNTER — Other Ambulatory Visit: Payer: Self-pay

## 2018-10-28 ENCOUNTER — Encounter: Payer: Self-pay | Admitting: Podiatry

## 2018-10-28 DIAGNOSIS — E1142 Type 2 diabetes mellitus with diabetic polyneuropathy: Secondary | ICD-10-CM

## 2018-10-28 MED ORDER — COLCHICINE 0.6 MG PO TABS: 0.6000 mg | ORAL_TABLET | Freq: Every day | ORAL | 3 refills | Status: DC

## 2018-10-28 MED ORDER — PREGABALIN 75 MG PO CAPS: 75.0000 mg | ORAL_CAPSULE | Freq: Two times a day (BID) | ORAL | 1 refills | Status: DC

## 2018-10-28 NOTE — Progress Notes (Signed)
She presents today for follow-up of her diabetic peripheral neuropathy she states that she forgot to pick up the Lyrica that we prescribed last time.  The states that she has had several gout flareups since then.  She uses a colchicine for that and states that it resolves quite nicely.  Objective: No change in physical examination pulses are strong and palpable.  Assessment: Diabetic peripheral neuropathy with a history of gouty arthritis.  Plan: Represcribed the Lyrica today refilled her colchicine will follow-up with her in 1 month to pick up her diabetic shoes and evaluate Lyrica.

## 2018-10-29 ENCOUNTER — Other Ambulatory Visit: Payer: Self-pay

## 2018-10-29 DIAGNOSIS — E7800 Pure hypercholesterolemia, unspecified: Secondary | ICD-10-CM

## 2018-10-29 MED ORDER — ROSUVASTATIN CALCIUM 10 MG PO TABS: 10.0000 mg | ORAL_TABLET | Freq: Every day | ORAL | 3 refills | Status: DC

## 2018-11-06 ENCOUNTER — Encounter (HOSPITAL_COMMUNITY): Payer: Self-pay

## 2018-11-06 ENCOUNTER — Encounter: Payer: Self-pay | Admitting: *Deleted

## 2018-11-06 ENCOUNTER — Ambulatory Visit (HOSPITAL_COMMUNITY)
Admission: EM | Admit: 2018-11-06 | Discharge: 2018-11-06 | Disposition: A | Discharge status: Home / Self Care | Payer: Medicare Other

## 2018-11-06 ENCOUNTER — Other Ambulatory Visit: Payer: Self-pay

## 2018-11-06 ENCOUNTER — Ambulatory Visit: Payer: Medicare Other

## 2018-11-06 DIAGNOSIS — Z13820 Encounter for screening for osteoporosis: Secondary | ICD-10-CM

## 2018-11-06 DIAGNOSIS — M545 Low back pain, unspecified: Secondary | ICD-10-CM

## 2018-11-06 DIAGNOSIS — M25511 Pain in right shoulder: Secondary | ICD-10-CM

## 2018-11-06 NOTE — ED Triage Notes (Signed)
Patient presents to Urgent Care with complaints of right arm pain and lower back pain since falling out of a stool at the gynecologist's office this morning. Patient reports she has no numbness or tingling, only pain.

## 2018-11-06 NOTE — ED Provider Notes (Signed)
[facility]    CSN: [id number] Arrival date & time: 11/06/18  1137      History   Chief Complaint Chief Complaint  Patient presents with  . Appointment    11;50  . Fall    HPI Courtney Pena is a 78 y.o. female.   Patient is a 78 year old female with past medical history of allergy, anemia, arthritis, a flutter, CAD, chronic anticoagulation with Xarelto, clotting disorder, CHF, gastritis, GERD, hernia, hyperlipidemia, hypertension, hypothyroidism, obesity, diabetes.  She presents today for follow-up.  She was at the gynecologist office sitting in a chair close to the ground and fell over landing on the right arm, right shoulder and right lower back area.  She has tenderness in these areas.  Worse with palpation and certain movements.  Denies any trouble walking, numbness or tingling.  Denies any saddle paresthesias, loss of bowel or bladder function.  She has not taken thing for the pain.  ROS per HPI      Past Medical History:  Diagnosis Date  . Allergy   . Anemia due to GI blood loss 08/12/2011  . Arthritis   . Atrial flutter ([place]) 12-07-2010   converted in ED with 300 mg flecainide  . CAD (coronary artery disease)    a. mild per cath in 2004;  b. nonischemic Myoview in March 2012;  c. Lex MV 1/14:  EF 66%, no ischemia  . Cataract   . Chronic anticoagulation - Xarelto started 07/07/2015, CHADS2CVASC=5 07/07/2015  . Clotting disorder ([place])   . Diastolic CHF ([place]) 99991111  . External hemorrhoids 06/07/2010  . Gastric antral vascular ectasia    source for gi bleed in 07/2011 - Xarelto stopped  . Gastritis   . GERD (gastroesophageal reflux disease)   . Hiatal hernia   . Hyperlipidemia   . Hypertension   . Hypothyroidism   . Neuromuscular disorder ([place])   . Obesity   . Paroxysmal atrial fibrillation (HCC)   . Personal history of colonic polyps 06/06/2009   cecal polyp  . Sleep apnea    does not use CPAP  . Type 2 diabetes mellitus with  diabetic chronic kidney disease ([place]) 04/25/2006      . Ulcer     Patient Active Problem List   Diagnosis Date Noted  . Viral respiratory illness 10/01/2018  . Lower abdominal pain 08/28/2018  . Myalgia due to statin 08/28/2018  . Exposure to Covid-19 Virus 07/30/2018  . Diabetic retinopathy, nonproliferative, moderate ([place]) 03/07/2018  . Headache 11/28/2017  . Diastolic CHF ([place]) 123XX123  . Cough 08/15/2017  . Right flank pain 03/13/2017  . Diabetic neuropathy ([place]) 10/17/2016  . Sudden hearing loss, left 09/25/2016  . Transaminitis 11/10/2015  . Chronic anticoagulation - Xarelto started 07/07/2015, CHADS2CVASC=5 07/07/2015  . Left sided chest pain 05/12/2015  . Subcutaneous cyst 07/09/2014  . Fatigue 06/24/2014  . Chronic gout of multiple sites 03/23/2014  . Degenerative arthritis of thumb 02/12/2014  . Unspecified vitamin D deficiency 09/18/2013  . Hyperlipidemia associated with type 2 diabetes mellitus ([place]) 08/22/2012  . Pseudophakia, both eyes 03/12/2012  . Nummular eczema 10/10/2011  . 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 Anemia due to GI blood loss 08/12/2011  . Pulmonary nodule 12/26/2010  . Paroxysmal atrial fibrillation ([place]) 12/14/2010  . After-cataract obscuring vision 11/20/2010  . Edema 11/02/2008  . Hypothyroidism 04/25/2006  . Type 2 diabetes mellitus with diabetic chronic kidney disease ([place]) 04/25/2006  . HYPERCHOLESTEROLEMIA 04/25/2006  . Morbid obesity ([place])  04/25/2006  . HYPERTENSION, BENIGN SYSTEMIC 04/25/2006  . Coronary atherosclerosis 04/25/2006  . Reflux esophagitis 04/25/2006  . DIVERTICULOSIS OF COLON 04/25/2006  . DJD, UNSPECIFIED 04/25/2006  . VERTIGO NOS OR DIZZINESS 04/25/2006  . Sleep apnea 04/25/2006    Past Surgical History:  Procedure Laterality Date  . BREAST BIOPSY Left   . BREAST EXCISIONAL BIOPSY Left    benign  . CARDIAC CATHETERIZATION  1999&2004  . CARPAL TUNNEL RELEASE Bilateral 2003  . CATARACT EXTRACTION  Bilateral   . COLONOSCOPY    . ESOPHAGOGASTRODUODENOSCOPY  08/13/2011   Procedure: ESOPHAGOGASTRODUODENOSCOPY (EGD);  Surgeon: Gatha Mayer, MD;  Location: [facility] ENDOSCOPY;  Service: Endoscopy;  Laterality: N/A;  . POLYPECTOMY      OB History    Gravida  6   Para  4   Term      Preterm      AB  2   Living  4     SAB  2   TAB      Ectopic      Multiple      Live Births               Home Medications    Prior to Admission medications   Medication Sig Start Date End Date Taking? Authorizing Provider  acetaminophen (TYLENOL) 650 MG CR tablet Take 650 mg by mouth every 8 (eight) hours as needed for pain.    [provider]  amLODipine (NORVASC) 5 MG tablet Take 1 tablet (5 mg total) by mouth daily. 11/28/17   [name], MD  Cholecalciferol (VITAMIN D3) 2000 units TABS Take 4 capsules by mouth daily.     [provider]  Coenzyme Q10 (COQ10) 100 MG CAPS Take 100 mEq by mouth daily.  06/28/05   [provider]  colchicine 0.6 MG tablet Take 1 tablet (0.6 mg total) by mouth daily. 10/28/18   Hyatt, Max T, DPM  Diclofenac Sodium 3 % GEL Apply to skin of arthritis joints. 05/12/18   [name], MD  diltiazem (CARDIZEM CD) 360 MG 24 hr capsule TAKE 1 CAPSULE BY MOUTH  DAILY 04/28/18   Hensel, Jamal Collin, MD  diltiazem (CARDIZEM) 30 MG tablet Take 1 tablet (30 mg total) by mouth every 4 (four) hours as needed (elevated HR as long as BP > 100). 10/14/17   Burtis Junes, NP  ferrous sulfate 325 (65 FE) MG EC tablet Take 650 mg by mouth daily.     [provider]  furosemide (LASIX) 20 MG tablet Take 20 mg by mouth daily as needed.     [provider]  glipiZIDE (GLUCOTROL XL) 10 MG 24 hr tablet Take 1 tablet (10 mg total) by mouth daily with breakfast. 10/23/18   [name], MD  Insulin Pen Needle 31G X 8 MM MISC Use with pen to inject once daily. 05/13/18   [name], MD  Lancets [facility] ULTRASOFT) lancets Use  to check blood sugar daily. 02/13/17   [name], MD  levothyroxine (SYNTHROID, LEVOTHROID) 112 MCG tablet TAKE 1 TABLET BY MOUTH  DAILY 04/30/18   [name], MD  meclizine (ANTIVERT) 25 MG tablet Take 25 mg by mouth 3 (three) times daily as needed for dizziness or nausea.    [provider]  metFORMIN (GLUCOPHAGE-XR) 500 MG 24 hr tablet TAKE 4 TABLETS BY MOUTH  DAILY WITH BREAKFAST 04/28/18   Philemon Kingdom, MD  metoprolol succinate (TOPROL-XL) 100 MG 24 hr  tablet Take 1 tablet (100 mg total) by mouth 2 (two) times daily. Take with or immediately following a meal. 10/22/18   Hensel, Jamal Collin, MD  Multiple Vitamin (MULTI-VITAMINS) TABS Take daily by mouth. 06/28/05   [provider]  nitroGLYCERIN (NITROSTAT) 0.4 MG SL tablet Place 1 tablet (0.4 mg total) under the tongue every 5 (five) minutes as needed for chest pain (Call 911 if chest pain after three doses). 04/14/18   Burtis Junes, NP  omeprazole (PRILOSEC) 40 MG capsule TAKE 1 CAPSULE BY MOUTH  DAILY 11/14/17   [name], MD  ONE TOUCH ULTRA TEST test strip TEST 3 TIMES DAILY 07/11/18   [name], MD  pregabalin (LYRICA) 75 MG capsule Take 1 capsule (75 mg total) by mouth 2 (two) times daily. 10/28/18   Hyatt, Max T, DPM  rivaroxaban (XARELTO) 20 MG TABS tablet Take 1 tablet (20 mg total) by mouth daily with supper. 08/28/18   [name], MD  rosuvastatin (CRESTOR) 10 MG tablet Take 1 tablet (10 mg total) by mouth daily. 10/29/18   [name], MD  Semaglutide,0.25 or 0.5MG /DOS, (OZEMPIC, 0.25 OR 0.5 MG/DOSE,) 2 MG/1.5ML SOPN Inject 0.25 mg into the skin once a week. 09/02/18   [name], MD  triamcinolone cream (KENALOG) 0.1 % Apply 1 application topically as needed (dermatitis).    [provider]    Family History Family History  Problem Relation Age of Onset  . Heart disease Father   . Diabetes Maternal Grandfather   . Hypertension Mother   . Other Mother         brain tumor-benign  . Diabetes Daughter        pre-diabeties  . Colon polyps Daughter   . Thyroid disease Daughter        x 2  . Colon cancer Neg Hx   . Esophageal cancer Neg Hx   . Stomach cancer Neg Hx   . Rectal cancer Neg Hx     Social History Social History   Tobacco Use  . Smoking status: Former Smoker    Packs/day: 2.00    Years: 30.00    Pack years: 60.00    Types: Cigarettes    Quit date: 02/26/1993    Years since quitting: 25.7  . Smokeless tobacco: Never Used  Substance Use Topics  . Alcohol use: No    Alcohol/week: 0.0 standard drinks  . Drug use: No     Allergies   Lisinopril, Nsaids, Penicillins, Sulfamethoxazole, Tolmetin, Actos [pioglitazone], Flecainide, Gabapentin, Iodinated diagnostic agents, Statins, Losartan potassium, and Zetia [ezetimibe]   Review of Systems Review of Systems   Physical Exam Triage Vital Signs ED Triage Vitals  Enc Vitals Group     BP 11/06/18 1155 (!) 142/90     Pulse Rate 11/06/18 1155 84     Resp 11/06/18 1155 16     Temp 11/06/18 1155 97.9 F (36.6 C)     Temp Source 11/06/18 1155 Temporal     SpO2 11/06/18 1155 97 %     Weight --      Height --      Head Circumference --      Peak Flow --      Pain Score 11/06/18 1200 8     Pain Loc --      Pain Edu? --      Excl. in [place]? --    No data found.  Updated Vital Signs BP 117/77 (BP Location:  Left Arm) Comment: large cuff  Pulse 84   Temp 97.9 F (36.6 C) (Temporal)   Resp 16   SpO2 97%   Visual Acuity Right Eye Distance:   Left Eye Distance:   Bilateral Distance:    Right Eye Near:   Left Eye Near:    Bilateral Near:     Physical Exam Vitals signs and nursing note reviewed.  Constitutional:      General: She is not in acute distress.    Appearance: Normal appearance. She is not ill-appearing, toxic-appearing or diaphoretic.  HENT:     Head: Normocephalic.     Nose: Nose normal.     Mouth/Throat:     Pharynx: Oropharynx is clear.  Eyes:      Conjunctiva/sclera: Conjunctivae normal.  Neck:     Musculoskeletal: Normal range of motion.  Pulmonary:     Effort: Pulmonary effort is normal.  Musculoskeletal: Normal range of motion.     Lumbar back: She exhibits tenderness. She exhibits no bony tenderness, no swelling, no edema, no deformity, no laceration, no pain, no spasm and normal pulse.       Back:     Right upper arm: She exhibits tenderness. She exhibits no bony tenderness, no swelling, no edema, no deformity and no laceration.       Arms:  Skin:    General: Skin is warm and dry.     Findings: No rash.  Neurological:     Mental Status: She is alert.  Psychiatric:        Mood and Affect: Mood normal.      UC Treatments / Results  Labs (all labs ordered are listed, but only abnormal results are displayed) Labs Reviewed - No data to display  EKG   Radiology No results found.  Procedures Procedures (including critical care time)  Medications Ordered in UC Medications - No data to display  Initial Impression / Assessment and Plan / UC Course  I have reviewed the triage vital signs and the nursing notes.  Pertinent labs & imaging results that were available during my care of the patient were reviewed by me and considered in my medical decision making (see chart for details).     Nothing concerning on exam. Back soreness and right arm soreness from the fall.  She can take Tylenol as needed for pain.  Alternate heat and ice Follow up as needed for continued or worsening symptoms  Final Clinical Impressions(s) / UC Diagnoses   Final diagnoses:  Acute right-sided low back pain without sciatica  Pain in joint of right shoulder     Discharge Instructions     No concerning problems on exam. I think it is all muscle soreness.  You can take Tylenol extra strength as needed for pain  Keep taking your other daily prescribed the symptoms.  You can do some ice or heat to the area and alternate. Follow up as  needed for continued or worsening symptoms    ED Prescriptions    None     Controlled Substance Prescriptions [place] Controlled Substance Registry consulted? Not Applicable   Orvan July, NP 11/06/18 1254

## 2018-11-06 NOTE — Discharge Instructions (Addendum)
No concerning problems on exam. I think it is all muscle soreness.  You can take Tylenol extra strength as needed for pain  Keep taking your other daily prescribed the symptoms.  You can do some ice or heat to the area and alternate. Follow up as needed for continued or worsening symptoms

## 2018-11-11 ENCOUNTER — Ambulatory Visit: Payer: Medicare Other | Admitting: Orthotics

## 2018-11-11 ENCOUNTER — Telehealth: Payer: Self-pay | Admitting: *Deleted

## 2018-11-11 NOTE — Telephone Encounter (Signed)
Spoke with pt pertaining to her DEXA report we need a forearm image per Dr. Dellis Filbert. Pt recently had a fall in our office and stated she was not happy with the way things went as far as no follow up with her.I explained we referred her to Urgent care,primary or emergency room. Pt went to emergency room for care.  I will send message to Practice admin for advice .Pt stated she would like to speak with Manager when she comes in for her visit 11/13/2018

## 2018-11-12 ENCOUNTER — Other Ambulatory Visit: Payer: Self-pay

## 2018-11-14 NOTE — Telephone Encounter (Signed)
I spoke with patient on Wed 11/12/18 regarding her fall.  The patient was checked on by Dr Phineas Real at the time of the fall and was then seen at [facility] for evaluation.  The patient did not need transporting by EMS/[facility] and could drive herself to be seen.  The patient states is feeling fine today. Did express disappointment in the follow up from the fall, with no one contacting her on Friday or Monday to check on her.  I apologized for that as the physician did she the evaluation from the Urgent Care visit.  We discussed returning for completion of the bone density and changing of the chair for the exam.  I advised the patient that I would send her documentation in her record of the fall. KW CMA

## 2018-11-14 NOTE — Progress Notes (Unsigned)
Note for record 11/06/2018  I was notified by Dwaine Gale that a patient fell in the bone density room.  Upon arrival the patient was sitting on the floor.  She had been sitting on a low seat and apparently leaned back and fell off.  The patient notes that she landed primarily on her side and right shoulder.  She never struck her head.  No loss of consciousness.  The patient was lifted off the floor and stood for a while and then subsequently sat down.  Was complaining of soreness in the right shoulder region.  Exam showed good range of motion with no overt trauma or deformity.  No bruising or abrasions were noted on the head or upper torso.  I recommended the patient take over-the-counter anti-inflammatories and apply heat to her shoulder.  Should follow-up with her primary physician, urgent care or emergency room if she would have more acute worsening of her pain, any neurologic symptoms or persistence of her discomfort.  The office manager was notified.   Dr. Donalynn Furlong

## 2018-11-14 NOTE — Progress Notes (Signed)
Note for record 11/06/2018  I was notified by Courtney Pena that a patient fell in the bone density room.  Upon arrival the patient was sitting on the floor.  She had been sitting on a low seat and apparently leaned back and fell off.  The patient notes that she landed primarily on her side and right shoulder.  She never struck her head.  No loss of consciousness.  The patient was lifted off the floor and stood for a while and then subsequently sat down.  Was complaining of soreness in the right shoulder region.  Exam showed good range of motion with no overt trauma or deformity.  No bruising or abrasions were noted on the head or upper torso.  I recommended the patient take over-the-counter anti-inflammatories and apply heat to her shoulder.  Should follow-up with her primary physician, urgent care or emergency room if she would have more acute worsening of her pain, any neurologic symptoms or persistence of her discomfort.  The office manager was notified.   Dr. Donalynn Furlong   Pt came in office for Dexa scan. I began scan with hips and spine. Spine was abnormal, had to exclude most of the spine except L4.  With that said, I needed to do her forearm.  I helped pt off the bed.  Then I put the bed and bench in proper position for forearm scan.  I sat patient down on bench and walked to my computer.  I saw patient adjust herself in the chair while pt moved she fell backwards.  I tried to get up fast enough but I was too late, pt hit the floor.  I asked pt if she was ok, she said she felt like she couldn't move.  I told the patient I will step away and get help.  I got Robbin, our float medical assistant on her way to a room to assist another patient, Dr Phineas Real was in the hall. I told him what happened and we all went to assist the fallen patient. Me and Dr Phineas Real got the patient off the floor, assisted her to chair wit hback support and arms.  Robbin got a wheel chair and left it at the side of the  door for the dexa room.  Pt stated she was in pain, right side, right shoulder and back pain.  Dr. Phineas Real examined patient and referred her to emergency room, primary care or urgent care.  Pt said she had to go to the restroom so she got out of the chair walked herself to the restroom.  I went and got her some water.  Dr. Phineas Real met me in the hallway and said call Britt Bottom which is our Glass blower/designer, she needs to be aware of this.  I called Sharrie Rothman, she said same thing as Dr Phineas Real to follow up with urgent care, primary care or emergency room, but she would advise emergency room if doesn't need to be transported by EMS and due to [place].  I went back to room, waited on pt to get out of restroom.  Pt came back to Dexa room and sat down. I explained to her again what our recommendations and said can you drive or would you like an ambulance?  Patient said she wanted to talk to her daughter before she does anything.  I said ok, pt proceeded to call daughter and got not answer.  Then she said she will call daughter in the car, I said ok would you  like a wheelchair and she said yes.  I pushed patient to her car in the parking lot. I assisted pt to her seat in the car and I went back inside.  Courtney Pena, RMA

## 2018-11-17 ENCOUNTER — Telehealth: Payer: Self-pay | Admitting: *Deleted

## 2018-11-17 NOTE — Telephone Encounter (Signed)
Called pt to give Dexa results

## 2018-11-18 ENCOUNTER — Ambulatory Visit: Payer: Medicare Other | Admitting: Orthotics

## 2018-11-18 ENCOUNTER — Other Ambulatory Visit: Payer: Self-pay

## 2018-11-18 DIAGNOSIS — E1142 Type 2 diabetes mellitus with diabetic polyneuropathy: Secondary | ICD-10-CM

## 2018-11-18 DIAGNOSIS — J988 Other specified respiratory disorders: Secondary | ICD-10-CM

## 2018-11-18 DIAGNOSIS — B9789 Other viral agents as the cause of diseases classified elsewhere: Secondary | ICD-10-CM

## 2018-11-18 NOTE — Progress Notes (Signed)
Need to reorder shoes size 11 xW; shoes too big 12 xw

## 2018-11-19 NOTE — Telephone Encounter (Signed)
Dexa Normal Pt aware of results

## 2018-11-24 ENCOUNTER — Other Ambulatory Visit: Payer: Self-pay

## 2018-11-24 ENCOUNTER — Ambulatory Visit (INDEPENDENT_AMBULATORY_CARE_PROVIDER_SITE_OTHER): Payer: Medicare Other | Admitting: Family Medicine

## 2018-11-24 ENCOUNTER — Encounter: Payer: Self-pay | Admitting: Family Medicine

## 2018-11-24 VITALS — BP 124/66 | HR 73 | Ht 65.0 in | Wt 230.8 lb

## 2018-11-24 DIAGNOSIS — E113399 Type 2 diabetes mellitus with moderate nonproliferative diabetic retinopathy without macular edema, unspecified eye: Secondary | ICD-10-CM

## 2018-11-24 DIAGNOSIS — Z0000 Encounter for general adult medical examination without abnormal findings: Secondary | ICD-10-CM

## 2018-11-24 DIAGNOSIS — I480 Paroxysmal atrial fibrillation: Secondary | ICD-10-CM

## 2018-11-24 DIAGNOSIS — Z23 Encounter for immunization: Secondary | ICD-10-CM

## 2018-11-24 DIAGNOSIS — Z8673 Personal history of transient ischemic attack (TIA), and cerebral infarction without residual deficits: Secondary | ICD-10-CM

## 2018-11-24 DIAGNOSIS — E1122 Type 2 diabetes mellitus with diabetic chronic kidney disease: Secondary | ICD-10-CM | POA: Diagnosis not present

## 2018-11-24 DIAGNOSIS — E7800 Pure hypercholesterolemia, unspecified: Secondary | ICD-10-CM | POA: Diagnosis not present

## 2018-11-24 DIAGNOSIS — D500 Iron deficiency anemia secondary to blood loss (chronic): Secondary | ICD-10-CM | POA: Diagnosis not present

## 2018-11-24 DIAGNOSIS — M75101 Unspecified rotator cuff tear or rupture of right shoulder, not specified as traumatic: Secondary | ICD-10-CM | POA: Insufficient documentation

## 2018-11-24 DIAGNOSIS — I10 Essential (primary) hypertension: Secondary | ICD-10-CM

## 2018-11-24 HISTORY — DX: Personal history of transient ischemic attack (TIA), and cerebral infarction without residual deficits: Z86.73

## 2018-11-24 LAB — POCT GLYCOSYLATED HEMOGLOBIN (HGB A1C): Hemoglobin A1C: 8.8 % — AB (ref 4.0–5.6)

## 2018-11-24 NOTE — Progress Notes (Signed)
Established Patient Office Visit  Subjective:  Patient ID: Courtney Pena, female    DOB: [birth year]  Age: 78 y.o. MRN: [id number]  CC:  Chief Complaint  Patient presents with  . Annual Exam    HPI Courtney Pena presents for annual exam and multiple problems.  Issues 1. 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  Of course, she is at high risk with DM, no statin until recently and HBP.  Good news is that she is finally tolerating a daily statin.  BP is under good contol.  On a DOAC for anticoag.  Need better DM control 2. DM.  Will see pharm D tomorrow.  Still on glipizide.  She has nicly improved on Oxempic.  Cost considerations aside, she should be off her glipizide and on an SGLT2 drug.  Will defer to Dr. Valentina Lucks. 3. [place] off stool when getting bone density test about 10 days ago.  Has lingering right shoulder and right buttocks pain.  Seen initially at Urgent care.  Slowly improving.   4. Chronic blood loss anemia.  On DOAC and iron.  Due for check. 5. Hyperlipidemia.  Now taking statin regularly x 3 months.  Due for Direct LDL. 6. Good health habits.  Does not smoke or drink.  Generally healthy diet.  Has been losing weight intentionally. 7. HPDP up to date.  Flu shot today.   8. PAF.  No recent palpitations.  On [place]    Past Medical History:  Diagnosis Date  . Allergy   . Anemia due to GI blood loss 08/12/2011  . Arthritis   . Atrial flutter ([place]) 12-07-2010   converted in ED with 300 mg flecainide  . CAD (coronary artery disease)    a. mild per cath in 2004;  b. nonischemic Myoview in March 2012;  c. Lex MV 1/14:  EF 66%, no ischemia  . Cataract   . Chronic anticoagulation - Xarelto started 07/07/2015, CHADS2CVASC=5 07/07/2015  . Clotting disorder ([place])   . Diastolic CHF ([place]) 99991111  . External hemorrhoids 06/07/2010  . Gastric antral vascular ectasia    source for gi bleed in 07/2011 - Xarelto stopped  .  Gastritis   . GERD (gastroesophageal reflux disease)   . Hiatal hernia   . Hyperlipidemia   . Hypertension   . Hypothyroidism   . Neuromuscular disorder ([place])   . Obesity   . Paroxysmal atrial fibrillation ([facility])   . Personal history of colonic polyps 06/06/2009   cecal polyp  . Sleep apnea    does not use CPAP  . 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 Ulcer     Past Surgical History:  Procedure Laterality Date  . BREAST BIOPSY Left   . BREAST EXCISIONAL BIOPSY Left    benign  . CARDIAC CATHETERIZATION  1999&2004  . CARPAL TUNNEL RELEASE Bilateral 2003  . CATARACT EXTRACTION Bilateral   . COLONOSCOPY    . ESOPHAGOGASTRODUODENOSCOPY  08/13/2011   Procedure: ESOPHAGOGASTRODUODENOSCOPY (EGD);  Surgeon: Gatha Mayer, MD;  Location: [facility] ENDOSCOPY;  Service: Endoscopy;  Laterality: N/A;  . POLYPECTOMY      Family History  Problem Relation Age of Onset  . Heart disease Father   . Diabetes Maternal Grandfather   . Hypertension Mother   . Other Mother        brain tumor-benign  . Diabetes Daughter  pre-diabeties  . Colon polyps Daughter   . Thyroid disease Daughter        x 2  . Colon cancer Neg Hx   . Esophageal cancer Neg Hx   . Stomach cancer Neg Hx   . Rectal cancer Neg Hx     Social History   Socioeconomic History  . Marital status: Widowed    Spouse name: Iona Beard  . Number of children: 4  . Years of education: some colle  . Highest education level: Not on file  Occupational History  . Occupation: Games developer: UNEMPLOYED  Social Needs  . Financial resource strain: Not on file  . Food insecurity    Worry: Not on file    Inability: Not on file  . Transportation needs    Medical: Not on file    Non-medical: Not on file  Tobacco Use  . Smoking status: Former Smoker    Packs/day: 2.00    Years: 30.00    Pack years: 60.00    Types: Cigarettes    Quit date: 02/26/1993    Years since quitting:  25.7  . Smokeless tobacco: Never Used  Substance and Sexual Activity  . Alcohol use: No    Alcohol/week: 0.0 standard drinks  . Drug use: No  . Sexual activity: Not Currently  Lifestyle  . Physical activity    Days per week: Not on file    Minutes per session: Not on file  . Stress: Not on file  Relationships  . Social Herbalist on phone: Not on file    Gets together: Not on file    Attends religious service: Not on file    Active member of club or organization: Not on file    Attends meetings of clubs or organizations: Not on file    Relationship status: Not on file  . Intimate partner violence    Fear of current or ex partner: Not on file    Emotionally abused: Not on file    Physically abused: Not on file    Forced sexual activity: Not on file  Other Topics Concern  . Not on file  Social History Narrative  . Not on file    Outpatient Medications Prior to Visit  Medication Sig Dispense Refill  . acetaminophen (TYLENOL) 650 MG CR tablet Take 650 mg by mouth every 8 (eight) hours as needed for pain.    Marland Kitchen amLODipine (NORVASC) 5 MG tablet Take 1 tablet (5 mg total) by mouth daily. 90 tablet 3  . Cholecalciferol (VITAMIN D3) 2000 units TABS Take 4 capsules by mouth daily.     . Coenzyme Q10 (COQ10) 100 MG CAPS Take 100 mEq by mouth daily.     . colchicine 0.6 MG tablet Take 1 tablet (0.6 mg total) by mouth daily. 90 tablet 3  . Diclofenac Sodium 3 % GEL Apply to skin of arthritis joints. 100 g 6  . diltiazem (CARDIZEM CD) 360 MG 24 hr capsule TAKE 1 CAPSULE BY MOUTH  DAILY 90 capsule 3  . diltiazem (CARDIZEM) 30 MG tablet Take 1 tablet (30 mg total) by mouth every 4 (four) hours as needed (elevated HR as long as BP > 100). 30 tablet 6  . ferrous sulfate 325 (65 FE) MG EC tablet Take 650 mg by mouth daily.     . furosemide (LASIX) 20 MG tablet Take 20 mg by mouth daily as needed.     Marland Kitchen glipiZIDE (GLUCOTROL XL) 10 MG  24 hr tablet Take 1 tablet (10 mg total) by mouth  daily with breakfast.    . Insulin Pen Needle 31G X 8 MM MISC Use with pen to inject once daily. 100 each 3  . Lancets (ONETOUCH ULTRASOFT) lancets Use to check blood sugar daily. 100 each 3  . levothyroxine (SYNTHROID, LEVOTHROID) 112 MCG tablet TAKE 1 TABLET BY MOUTH  DAILY 90 tablet 3  . meclizine (ANTIVERT) 25 MG tablet Take 25 mg by mouth 3 (three) times daily as needed for dizziness or nausea.    . metFORMIN (GLUCOPHAGE-XR) 500 MG 24 hr tablet TAKE 4 TABLETS BY MOUTH  DAILY WITH BREAKFAST 360 tablet 3  . metoprolol succinate (TOPROL-XL) 100 MG 24 hr tablet Take 1 tablet (100 mg total) by mouth 2 (two) times daily. Take with or immediately following a meal. 180 tablet 3  . Multiple Vitamin (MULTI-VITAMINS) TABS Take daily by mouth.    . nitroGLYCERIN (NITROSTAT) 0.4 MG SL tablet Place 1 tablet (0.4 mg total) under the tongue every 5 (five) minutes as needed for chest pain (Call 911 if chest pain after three doses). 25 tablet 3  . omeprazole (PRILOSEC) 40 MG capsule TAKE 1 CAPSULE BY MOUTH  DAILY 90 capsule 3  . ONE TOUCH ULTRA TEST test strip TEST 3 TIMES DAILY 300 each 3  . pregabalin (LYRICA) 75 MG capsule Take 1 capsule (75 mg total) by mouth 2 (two) times daily. 60 capsule 1  . rivaroxaban (XARELTO) 20 MG TABS tablet Take 1 tablet (20 mg total) by mouth daily with supper. 42 tablet 0  . rosuvastatin (CRESTOR) 10 MG tablet Take 1 tablet (10 mg total) by mouth daily. 90 tablet 3  . Semaglutide,0.25 or 0.5MG /DOS, (OZEMPIC, 0.25 OR 0.5 MG/DOSE,) 2 MG/1.5ML SOPN Inject 0.25 mg into the skin once a week. 2 pen 0  . triamcinolone cream (KENALOG) 0.1 % Apply 1 application topically as needed (dermatitis).     No facility-administered medications prior to visit.     Allergies  Allergen Reactions  . Lisinopril Swelling    REACTION: swelling, may have had some breathing involvement.   . Nsaids Other (See Comments)    Patient reports internal bleeding  . Penicillins Shortness Of Breath and  Swelling    Arm Swelling with Penicillin (Occurred in 1960s) Breathing - throat swelling with Amoxicillin (Occurred prior to 2002)  . Sulfamethoxazole Hives and Itching    "welps all over" immediately after dose  . Tolmetin Other (See Comments)    Patient reports internal bleeding  . Actos [Pioglitazone] Swelling    REACTION: swelling all over body  . Flecainide Other (See Comments)    Blurry  vision  . Gabapentin Other (See Comments)    Caused dysphoria   . Iodinated Diagnostic Agents Itching    Pt. Developed mild itching after receiving IV cm; pt. Held; Dr. Keane Scrape recomended she take 50 mg of benadryl when she goes home-if necessary; Dr Mickey Farber recommends benadryl prior to future exams requiring contrast media, but stated other doctors may recommend another premedication prep.  . Statins Other (See Comments)    Muscle aches with multiple statins  . Losartan Potassium Other (See Comments)    Lower extremity swelling  . Zetia [Ezetimibe] Other (See Comments)    Cramps    ROS Review of Systems   No CP, DOE, bleeding, abd pain, new headache or new weakness or numbness.  Denies change in bowel, bladder appetite or cognition.   Objective:    Physical  Exam  BP 124/66   Pulse 73   Ht 5\' 5"  (1.651 m)   Wt 230 lb 12.8 oz (104.7 kg)   SpO2 98%   BMI 38.41 kg/m  Wt Readings from Last 3 Encounters:  11/24/18 230 lb 12.8 oz (104.7 kg)  09/30/18 229 lb (103.9 kg)  08/28/18 229 lb 3.2 oz (104 kg)   Heent WNL Neck supple without masses Lungs clear Cardiac RRR without m or g Abd bening Ext no edema Right shoulder exam consistent with rotator cuff. Pain in low back is over sacrum toward tailbone. Neuro, motor, sensory, gait and cognition grossly intact.  Health Maintenance Due  Topic Date Due  . INFLUENZA VACCINE  09/27/2018    There are no preventive care reminders to display for this patient.  Lab Results  Component Value Date   TSH 2.160 11/28/2017   Lab Results   Component Value Date   WBC 7.1 08/28/2018   HGB 9.3 (L) 08/28/2018   HCT 31.8 (L) 08/28/2018   MCV 79 08/28/2018   PLT 339 08/28/2018   Lab Results  Component Value Date   NA 135 08/28/2018   K 4.6 08/28/2018   CO2 21 08/28/2018   GLUCOSE 176 (H) 08/28/2018   BUN 19 08/28/2018   CREATININE 1.13 (H) 08/28/2018   BILITOT <0.2 11/28/2017   ALKPHOS 100 11/28/2017   AST 10 11/28/2017   ALT 12 11/28/2017   PROT 7.3 11/28/2017   ALBUMIN 4.3 11/28/2017   CALCIUM 10.1 08/28/2018   ANIONGAP 9 07/28/2017   GFR 75.20 03/20/2017   Lab Results  Component Value Date   CHOL 274 (H) 12/02/2017   Lab Results  Component Value Date   HDL 65 12/02/2017   Lab Results  Component Value Date   LDLCALC 185 (H) 12/02/2017   Lab Results  Component Value Date   TRIG 120 12/02/2017   Lab Results  Component Value Date   CHOLHDL 4.2 12/02/2017   Lab Results  Component Value Date   HGBA1C 8.8 (A) 11/24/2018      Assessment & Plan:   Problem List Items Addressed This Visit    Anemia due to GI blood loss   Relevant Orders   CBC   Ferritin   HYPERCHOLESTEROLEMIA   Relevant Orders   LDL cholesterol, direct   HYPERTENSION, BENIGN SYSTEMIC   Relevant Orders   Basic Metabolic Panel   Type 2 diabetes mellitus with diabetic chronic kidney disease ([place]) - Primary   Relevant Orders   HgB A1c (Completed)    Other Visit Diagnoses    Need for immunization against influenza       Relevant Orders   Flu Vaccine QUAD 36+ mos IM (Completed)      No orders of the defined types were placed in this encounter.   Follow-up: No follow-ups on file.    [name], MD

## 2018-11-24 NOTE — Assessment & Plan Note (Signed)
Mild.  After trauma.  ROM exercises.  FU 2-4 weeks if still present.

## 2018-11-24 NOTE — Assessment & Plan Note (Signed)
Seems to be in sinus.  STable on doac.

## 2018-11-24 NOTE — Assessment & Plan Note (Signed)
Need better control of DM.  She will be seen by Dr. Valentina Lucks tomorrow.  Likely DC sulfonylurea and start SGLT2

## 2018-11-24 NOTE — Assessment & Plan Note (Signed)
78 yo female with several chronic problems.  Control is good on all except DM.  Good life style and no at risk behaviors.

## 2018-11-24 NOTE — Assessment & Plan Note (Signed)
Another reason to be on statin and improve DM control

## 2018-11-24 NOTE — Patient Instructions (Signed)
I will call with lab results. I am delighted you are taking the cholesterol medicine every day. Remember the shoulder exercises to keep things moving. See me in one month if your back and shoulder are not improving. The stroke in the eye is an example of why Dr. Laurey Arrow and I are working on control of the cholesterol, diabetes and blood pressure.   I am going to leave the diabetes to Dr. Laurey Arrow.  Likely, he will want to  Switch out the old glipizide to a new pill - a flozin.

## 2018-11-24 NOTE — Assessment & Plan Note (Signed)
Finally we have found a statin she can tolerate

## 2018-11-24 NOTE — Assessment & Plan Note (Signed)
Working on weight loss

## 2018-11-25 ENCOUNTER — Encounter: Payer: Self-pay | Admitting: Pharmacist

## 2018-11-25 ENCOUNTER — Other Ambulatory Visit: Payer: Self-pay

## 2018-11-25 ENCOUNTER — Ambulatory Visit: Payer: Medicare Other | Admitting: Podiatry

## 2018-11-25 ENCOUNTER — Ambulatory Visit (INDEPENDENT_AMBULATORY_CARE_PROVIDER_SITE_OTHER): Payer: Medicare Other | Admitting: Pharmacist

## 2018-11-25 VITALS — BP 128/70 | HR 87 | Ht 64.57 in | Wt 229.6 lb

## 2018-11-25 DIAGNOSIS — M1811 Unilateral primary osteoarthritis of first carpometacarpal joint, right hand: Secondary | ICD-10-CM | POA: Diagnosis not present

## 2018-11-25 DIAGNOSIS — E7800 Pure hypercholesterolemia, unspecified: Secondary | ICD-10-CM

## 2018-11-25 DIAGNOSIS — K31819 Angiodysplasia of stomach and duodenum without bleeding: Secondary | ICD-10-CM

## 2018-11-25 DIAGNOSIS — Z7901 Long term (current) use of anticoagulants: Secondary | ICD-10-CM

## 2018-11-25 DIAGNOSIS — I2510 Atherosclerotic heart disease of native coronary artery without angina pectoris: Secondary | ICD-10-CM | POA: Diagnosis not present

## 2018-11-25 DIAGNOSIS — E1122 Type 2 diabetes mellitus with diabetic chronic kidney disease: Secondary | ICD-10-CM

## 2018-11-25 DIAGNOSIS — I480 Paroxysmal atrial fibrillation: Secondary | ICD-10-CM

## 2018-11-25 LAB — CBC
Hematocrit: 32.5 % — ABNORMAL LOW (ref 34.0–46.6)
Hemoglobin: 10 g/dL — ABNORMAL LOW (ref 11.1–15.9)
MCH: 24.4 pg — ABNORMAL LOW (ref 26.6–33.0)
MCHC: 30.8 g/dL — ABNORMAL LOW (ref 31.5–35.7)
MCV: 79 fL (ref 79–97)
Platelets: 318 10*3/uL (ref 150–450)
RBC: 4.1 x10E6/uL (ref 3.77–5.28)
RDW: 17.1 % — ABNORMAL HIGH (ref 11.7–15.4)
WBC: 8.8 10*3/uL (ref 3.4–10.8)

## 2018-11-25 LAB — BASIC METABOLIC PANEL
BUN/Creatinine Ratio: 18 (ref 12–28)
BUN: 17 mg/dL (ref 8–27)
CO2: 20 mmol/L (ref 20–29)
Calcium: 9.6 mg/dL (ref 8.7–10.3)
Chloride: 103 mmol/L (ref 96–106)
Creatinine, Ser: 0.96 mg/dL (ref 0.57–1.00)
GFR calc Af Amer: 66 mL/min/{1.73_m2} (ref >59)
GFR calc non Af Amer: 57 mL/min/{1.73_m2} — ABNORMAL LOW (ref >59)
Glucose: 181 mg/dL — ABNORMAL HIGH (ref 65–99)
Potassium: 4.4 mmol/L (ref 3.5–5.2)
Sodium: 139 mmol/L (ref 134–144)

## 2018-11-25 LAB — LDL CHOLESTEROL, DIRECT: LDL Direct: 62 mg/dL (ref 0–99)

## 2018-11-25 LAB — FERRITIN: Ferritin: 12 ng/mL — ABNORMAL LOW (ref 15–150)

## 2018-11-25 MED ORDER — OZEMPIC (0.25 OR 0.5 MG/DOSE) 2 MG/1.5ML ~~LOC~~ SOPN: 0.5000 mg | PEN_INJECTOR | SUBCUTANEOUS | 0 refills | Status: DC

## 2018-11-25 MED ORDER — DICLOFENAC SODIUM 1 % TD GEL: 2.0000 g | Freq: Four times a day (QID) | TRANSDERMAL | 99 refills | Status: DC

## 2018-11-25 MED ORDER — NITROGLYCERIN 0.4 MG SL SUBL: 0.4000 mg | SUBLINGUAL_TABLET | SUBLINGUAL | 3 refills | Status: DC | PRN

## 2018-11-25 MED ORDER — APIXABAN 5 MG PO TABS: 5.0000 mg | ORAL_TABLET | Freq: Two times a day (BID) | ORAL | 0 refills | Status: DC

## 2018-11-25 NOTE — Assessment & Plan Note (Signed)
-  After discussing with PCP, d/t hx of GI bleed, switch from Xarelto 20 mg daily to Eliquis 5 mg BID. Provided patient with 3 week supply of Eliquis. Counseled patient to finish remaining Xarelto (1 week worth) and then start taking Eliquis

## 2018-11-25 NOTE — Progress Notes (Signed)
I have reviewed and agree with Dr. Graylin Shiver management.

## 2018-11-25 NOTE — Assessment & Plan Note (Signed)
ASCVD risk - secondary prevention in patient with DM. Last LDL is controlled at 62mg /dL. -Continued rosuvastatin (Crestor) 10 mg. This is the maximally tolerated statin dose. She did state she has very slight muscle pain in right leg.  -Sent in refill for nitroglycerin due to her current supply expiring soon.

## 2018-11-25 NOTE — Assessment & Plan Note (Signed)
Diabetes longstanding currently uncontrolled. Patient reports to be adherent with medication. -Continued GLP-1 Ozempic (semaglutide) at 0.5 mg weekly. Supplied patient with 3 sample pens (3 month supply). -Continued glipizide 10 mg BID. -Discussed SGLT2i with patient. Previously tried PAP for Jardiance, but did not qualify for making just above the maximum income necessary. Will work to see if other agents/companies allow for higher max income.  -Discussed with her that she may need to consider switching Medicare plans that has a lower deductible so that she can afford her medications -Extensively discussed pathophysiology of DM, recommended lifestyle interventions, dietary effects on glycemic control -Counseled on s/sx of and management of hypoglycemia -Next A1C anticipated in 3 months

## 2018-11-25 NOTE — Progress Notes (Signed)
S:     Chief Complaint  Patient presents with  . Medication Management    Diabetes    Patient arrives in good spirits and is able to ambulate on her own.  Presents for diabetes evaluation, education, and management Patient was referred and last seen by Primary Care Provider on 9/28.  Patient reports Diabetes was diagnosed over 10 years ago.  Insurance coverage/medication affordability: [employer]  Patient reports adherence with medications.  Current diabetes medications include: metformin XR 2,000mg  daily, glipizide 10mg  BID, Ozempic 0.5mg  weekly Current hypertension medications include: amlodipine 5mg  daily, metoprolol 100mg  daily, diltiazem 360mg  daily Current hyperlipidemia medications include: rosuvastatin 10mg  daily  Patient denies hypoglycemic events.   Patient reports neuropathy. Patient denies visual changes.   O:  Physical Exam Constitutional:      Appearance: Normal appearance.  Musculoskeletal:        General: Swelling present.     Comments: 1+  Neurological:     Mental Status: She is alert.  Psychiatric:        Mood and Affect: Mood normal.        Behavior: Behavior normal.        Thought Content: Thought content normal.        Judgment: Judgment normal.     Review of Systems  All other systems reviewed and are negative.    Lab Results  Component Value Date   HGBA1C 8.8 (A) 11/24/2018   There were no vitals filed for this visit.  Lipid Panel     Component Value Date/Time   CHOL 274 (H) 12/02/2017 1549   TRIG 120 12/02/2017 1549   HDL 65 12/02/2017 1549   CHOLHDL 4.2 12/02/2017 1549   CHOLHDL 4.7 01/27/2015 1238   VLDL 28 01/27/2015 1238   LDLCALC 185 (H) 12/02/2017 1549   LDLDIRECT 62 11/24/2018 1211   LDLDIRECT 178 (H) 05/12/2015 1229    Home fasting CBG: 200s Reports CBGs ~175 in the evening unless she doesn't eat much then she gets readings ~100  Clinical ASCVD: Yes   A/P: Diabetes longstanding currently  uncontrolled. Patient reports to be adherent with medication. -Continued GLP-1 Ozempic (semaglutide) at 0.5 mg weekly. Supplied patient with 3 sample pens (3 month supply). -Continued glipizide 10 mg BID. -Discussed SGLT2i with patient. Previously tried PAP for Jard[place], but did not qualify for making just above the maximum income necessary. Will work to see if other agents/companies allow for higher max income.  -Discussed with her that she may need to consider switching Medicare plans that has a lower deductible so that she can afford her medications -Extensively discussed pathophysiology of DM, recommended lifestyle interventions, dietary effects on glycemic control -Counseled on s/sx of and management of hypoglycemia -Next A1C anticipated in 3 months  ASCVD risk - secondary prevention in patient with DM. Last LDL is controlled at 62mg /dL. -Continued rosuvastatin (Crestor) 10 mg. This is the maximally tolerated statin dose. She did state she has very slight muscle pain in right leg.  -Sent in refill for nitroglycerin due to her current supply expiring soon.  Hypertension currently at goal of <130/80 mmHg. Patient reports to be adherent with medication.  -After discussing with PCP, d/t hx of GI bleed, switch from Xarelto 20 mg daily to Eliquis 5 mg BID. Provided patient with 3 week supply of Eliquis. Counseled patient to finish remaining Xarelto (1 week worth) and then start taking Eliquis.  -Patient brought in iron supplement bottles and is taking 50mg  of supplemental iron.  -  Discussed diclofenac with patient and she stated that it has worked well for her feet pain. Diclofenac 3% rx that was previously sent was too expensive for her (over $1000), sent in a 1% prescription.  At next visit consider:  -Patient is currently on a daily PPI. She questioned if this is still necessary. At next visit, consider switching her to a daily H2RA (famotidine) with PPI prn for reflux flares.  -Patient is  taking 4000 units of Vitamin D and Prevagen, which contains 2000 units of Vitamin D. If labs are drawn, consider obtaining a Vitamin D level.  Written patient instructions provided.  Total time in face to face counseling 50 minutes.   Follow up Pharmacist Clinic Visit in one month at end of October.   Patient seen with Gerre Pebbles PGY1 Pharamcy resident and Murlean Iba PharmD candidate

## 2018-11-25 NOTE — Patient Instructions (Addendum)
It was nice to meet with you today.  Today, we changed your blood thinner from Xarelto to Eliquis. Eliquis is a similar medication but has less risk of stomach bleeding. This medication has to be taken twice daily. Take 1 tablet by mouth in the morning and 1 tablet by mouth in the evening.  Continue taking metformin, glipizide, and Ozmepic 0.5mg  weekly.   Please come back to see [name] in 1 month on a Thursday so that we can talk more about getting started on a "flozin" medication.

## 2018-11-28 NOTE — Progress Notes (Signed)
CARDIOLOGY OFFICE NOTE  Date:  12/01/2018    Courtney Pena Date of Birth: 1940-11-01 Medical Record [id number]  PCP:  [name], MD  Cardiologist:  Gillian Shields    Chief Complaint  Patient presents with  . Follow-up    History of Present Illness: Courtney Pena is a 78 y.o. female who presents today for an 8 month check.  Seen for Dr. Johnsie Cancel.  She has a history of HTN, DM, HLD,obesityandOSA. Long history of PAF. OnXarelto -held in past for gastric ectasia that was ablated. She has aCHADS2VASCof at [employer]. Intolerant to flecainidedue tovisual changes.She has seen Dr. Rayann Heman in the past - declined ablation but noted sotalol could be an optionfor her. History of mild CAD noted by remote cath in 2004. Last Myoview dates back to 2018.    Last seen here in [facility] 2019by Dr. Johnsie Cancel.  I then saw her in August of 2019 for a work in for cough and congestion. Was getting ready to travel and wanted "checked out". Echo updated. Round of antibiotics given. Continuedto struggle with weight loss. Seen as a work in back in December- weight was up - lots of dietary indiscretion with salt. She was more anemic - not taking her iron regularly. Had never started/filled her Repatha. Last seen here in the office back in February - still with lots of issues/complaints which included chest pain. We did a Myoview which was negative for ischemia - EF slightly down but had had echo in August of 2019 which showed a normal EF. Medical management was continued.   The patient does not have symptoms concerning for COVID-19 infection (fever, chills, cough, or new shortness of breath).   Comes in today. Here alone. She has been doing ok. 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 This has been good for her. Her arthritis is bothering her the most. No real swelling. She has lost some weight. She has been able to get on statin and had her  most recent LDL down in the 60's. She has had her diabetic medicines changed and is anxious to see if this has improved on her next A1C check. She will be switching from Xarelto to Eliquis - sounds like she can get some assistance better with Eliquis. Few spells of AF - she has not taken much short acting CCB therapy. She has had some vague chest pain - nothing exertional. Low risk Myoview from earlier this year noted. BP has been running good.   Past Medical History:  Diagnosis Date  . Allergy   . Anemia due to GI blood loss 08/12/2011  . Arthritis   . Atrial flutter ([place]) 12-07-2010   converted in ED with 300 mg flecainide  . CAD (coronary artery disease)    a. mild per cath in 2004;  b. nonischemic Myoview in March 2012;  c. Lex MV 1/14:  EF 66%, no ischemia  . Cataract   . Chronic anticoagulation - Xarelto started 07/07/2015, CHADS2CVASC=5 07/07/2015  . Clotting disorder ([place])   . Diastolic CHF ([place]) 99991111  . External hemorrhoids 06/07/2010  . Gastric antral vascular ectasia    source for gi bleed in 07/2011 - Xarelto stopped  . Gastritis   . GERD (gastroesophageal reflux disease)   . Hiatal hernia   . Hyperlipidemia   . Hypertension   . Hypothyroidism   . Neuromuscular disorder ([place])   . Obesity   . Paroxysmal atrial fibrillation ([facility])   .  Personal history of colonic polyps 06/06/2009   cecal polyp  . Sleep apnea    does not use CPAP  . 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 Ulcer     Past Surgical History:  Procedure Laterality Date  . BREAST BIOPSY Left   . BREAST EXCISIONAL BIOPSY Left    benign  . CARDIAC CATHETERIZATION  1999&2004  . CARPAL TUNNEL RELEASE Bilateral 2003  . CATARACT EXTRACTION Bilateral   . COLONOSCOPY    . ESOPHAGOGASTRODUODENOSCOPY  08/13/2011   Procedure: ESOPHAGOGASTRODUODENOSCOPY (EGD);  Surgeon: Gatha Mayer, MD;  Location[facility] ENDOSCOPY;  Service: Endoscopy;  Laterality: N/A;  . POLYPECTOMY        Medications: Current Meds  Medication Sig  . acetaminophen (TYLENOL) 650 MG CR tablet Take 650 mg by mouth every 8 (eight) hours as needed for pain.  Marland Kitchen apixaban (ELIQUIS) 5 MG TABS tablet Take 1 tablet (5 mg total) by mouth 2 (two) times daily.  Marland Kitchen Apoaequorin (PREVAGEN PO) Take 1 tablet by mouth.  . Cholecalciferol (VITAMIN D3) 2000 units TABS Take 2 capsules by mouth daily.   . Coenzyme Q10 (COQ10) 100 MG CAPS Take 100 mEq by mouth daily.   . colchicine 0.6 MG tablet Take 1 tablet (0.6 mg total) by mouth daily. (Patient taking differently: Take 0.6 mg by mouth as needed. )  . diclofenac sodium (VOLTAREN) 1 % GEL Apply 2 g topically 4 (four) times daily.  Marland Kitchen diltiazem (CARDIZEM CD) 360 MG 24 hr capsule TAKE 1 CAPSULE BY MOUTH  DAILY  . diltiazem (CARDIZEM) 30 MG tablet Take 1 tablet (30 mg total) by mouth every 4 (four) hours as needed (elevated HR as long as BP > 100).  . ferrous sulfate 325 (65 FE) MG EC tablet Take 650 mg by mouth daily.   . furosemide (LASIX) 20 MG tablet Take 20 mg by mouth daily as needed.   Marland Kitchen glipiZIDE (GLUCOTROL XL) 10 MG 24 hr tablet Take 1 tablet (10 mg total) by mouth daily with breakfast. (Patient taking differently: Take 10 mg by mouth 2 (two) times daily with a meal. )  . glucose blood (ONETOUCH ULTRA) test strip 1 Container by Other route every morning.  . Insulin Pen Needle 31G X 8 MM MISC Use with pen to inject once daily.  . Lancets (ONETOUCH ULTRASOFT) lancets Use to check blood sugar daily.  Marland Kitchen levothyroxine (SYNTHROID, LEVOTHROID) 112 MCG tablet TAKE 1 TABLET BY MOUTH  DAILY  . meclizine (ANTIVERT) 25 MG tablet Take 25 mg by mouth 3 (three) times daily as needed for dizziness or nausea.  . metFORMIN (GLUCOPHAGE-XR) 500 MG 24 hr tablet TAKE 4 TABLETS BY MOUTH  DAILY WITH BREAKFAST  . metoprolol succinate (TOPROL-XL) 100 MG 24 hr tablet Take 1 tablet (100 mg total) by mouth 2 (two) times daily. Take with or immediately following a meal.  . Multiple  Vitamin (MULTI-VITAMINS) TABS Take daily by mouth.  . nitroGLYCERIN (NITROSTAT) 0.4 MG SL tablet Place 1 tablet (0.4 mg total) under the tongue every 5 (five) minutes as needed for chest pain (Call 911 if chest pain after three doses).  Marland Kitchen omeprazole (PRILOSEC) 40 MG capsule TAKE 1 CAPSULE BY MOUTH  DAILY  . ONE TOUCH ULTRA TEST test strip TEST 3 TIMES DAILY  . pregabalin (LYRICA) 75 MG capsule Take 1 capsule (75 mg total) by mouth 2 (two) times daily.  . rosuvastatin (CRESTOR) 10 MG tablet Take 1  tablet (10 mg total) by mouth daily.  . Semaglutide,0.25 or 0.5MG /DOS, (OZEMPIC, 0.25 OR 0.5 MG/DOSE,) 2 MG/1.5ML SOPN Inject 0.5 mg into the skin once a week.  . triamcinolone cream (KENALOG) 0.1 % Apply 1 application topically as needed (dermatitis).  . [DISCONTINUED] amLODipine (NORVASC) 5 MG tablet Take 1 tablet (5 mg total) by mouth daily.     Allergies: Allergies  Allergen Reactions  . Lisinopril Swelling    Swelling, may have had some breathing involvement.   . Nsaids Other (See Comments)    Patient reports internal bleeding. Experienced with tolmetin.   Marland Kitchen Penicillins Shortness Of Breath and Swelling    Arm Swelling with Penicillin (Occurred in 1960s) Breathing - throat swelling with Amoxicillin (Occurred prior to 2002)  . Sulfamethoxazole Hives and Itching    "welps all over" immediately after dose  . Actos [Pioglitazone] Swelling    Swelling all over body  . Flecainide Other (See Comments)    Blurry  vision  . Gabapentin Other (See Comments)    Caused dysphoria   . Iodinated Diagnostic Agents Itching    Pt. Developed mild itching after receiving IV cm; pt. Held; Dr. Keane Scrape recomended she take 50 mg of benadryl when she goes home-if necessary; Dr Mickey Farber recommends benadryl prior to future exams requiring contrast media, but stated other doctors may recommend another premedication prep.  . Statins Other (See Comments)    Muscle aches with multiple statins. Able to tolerate  rosuvastatin.  . Losartan Potassium Other (See Comments)    Lower extremity swelling  . Zetia [Ezetimibe] Other (See Comments)    Cramps    Social History: The patient  reports that she quit smoking about 25 years ago. Her smoking use included cigarettes. She has a 60.00 pack-year smoking history. She has never used smokeless tobacco. She reports that she does not drink alcohol or use drugs.   Family History: The patient's family history includes Colon polyps in her daughter; Diabetes in her daughter and maternal grandfather; Heart disease in her father; Hypertension in her mother; Other in her mother; Thyroid disease in her daughter.   Review of Systems: Please see the history of present illness.   All other systems are reviewed and negative.   Physical Exam: VS:  BP 130/60   Pulse 86   Ht 5\' 4"  (1.626 m)   Wt 230 lb 1.9 oz (104.4 kg)   SpO2 96%   BMI 39.50 kg/m  .  BMI Body mass index is 39.5 kg/m.  Wt Readings from Last 3 Encounters:  12/01/18 230 lb 1.9 oz (104.4 kg)  11/25/18 229 lb 9.6 oz (104.1 kg)  11/24/18 230 lb 12.8 oz (104.7 kg)    General: Pleasant. Obese. Her weight is down from her last visit with me from 237#. She is in no acute distress.  HEENT: Normal.  Neck: Supple, no JVD, carotid bruits, or masses noted.  Cardiac: Regular rate and rhythm. No murmurs, rubs, or gallops. No edema.  Respiratory:  Lungs are clear to auscultation bilaterally with normal work of breathing.  GI: Soft and nontender.  MS: No deformity or atrophy but her fingers have arthritic changes noted. Gait and ROM intact.  Skin: Warm and dry. Color is normal.  Neuro:  Strength and sensation are intact and no gross focal deficits noted.  Psych: Alert, appropriate and with normal affect.   LABORATORY DATA:  EKG:  EKG is not ordered today.  Lab Results  Component Value Date   WBC 8.8 11/24/2018  HGB 10.0 (L) 11/24/2018   HCT 32.5 (L) 11/24/2018   PLT 318 11/24/2018   GLUCOSE 181  (H) 11/24/2018   CHOL 274 (H) 12/02/2017   TRIG 120 12/02/2017   HDL 65 12/02/2017   LDLDIRECT 62 11/24/2018   LDLCALC 185 (H) 12/02/2017   ALT 12 11/28/2017   AST 10 11/28/2017   NA 139 11/24/2018   K 4.4 11/24/2018   CL 103 11/24/2018   CREATININE 0.96 11/24/2018   BUN 17 11/24/2018   CO2 20 11/24/2018   TSH 2.160 11/28/2017   INR 1.06 08/14/2011   HGBA1C 8.8 (A) 11/24/2018     BNP (last 3 results) No results for input(s): BNP in the last 8760 hours.  ProBNP (last 3 results) Recent Labs    02/05/18 1507  PROBNP 247     Other Studies Reviewed Today:  Myoview Study Highlights 03/2018    Nuclear stress EF: 49%.  No T wave inversion was noted during stress.  There was no ST segment deviation noted during stress.  The study is normal.  This is a low risk study.   Normal perfusion. LVEF 49% with very mild global hypokinesis. This is a low risk study. Consider echo correlation of LVEF.   Lower Extremity Arterial Doppler Summary 03/2018: Right: Resting right ankle-brachial index is within normal range. No evidence of significant right lower extremity arterial disease. The right toe-brachial index is normal.  Left: Resting left ankle-brachial index is within normal range. No evidence of significant left lower extremity arterial disease. The left toe-brachial index is mildly abnormal.    *See table(s) above for measurements and observations.  Electronically signed by Ena Dawley MD on 04/22/2018 at 1:54:12 PM.    EchoStudy Conclusions8/2019  - Left ventricle: The cavity size was mildly dilated. Wall thickness was normal. Systolic function was normal. The estimated ejection fraction was in the range of 55% to 60%. Wall motion was normal; there were no regional wall motion abnormalities. Doppler parameters are consistent with abnormal left ventricular relaxation (grade 1 diastolic dysfunction). Doppler parameters are consistent with high  ventricular filling pressure. - Mitral valve: Calcified annulus. Mildly thickened leaflets .  Impressions:  - Normal LV systolic function; mild LVE; mild diastolic dysfunction.  Cardiac TelemetryStudy Highlights11/2018  NSR Average HR 70  No significant arrhythmias even with palpitations     Assessment/Plan:  1.Chronic atypical chest pain - low risk Myoview from earlier this year - she is doing better with CV risk factor modification - encouraged her to keep up the good work.   2. Chronic diastolic HF - needs good BP control and continued sodium restriction.   3. HTN - BP is good - not sure why she is on 2 CCB's - will stop Norvasc today. She is ACE allergic - may consider ARB therapy if needed.   4. PAF - in sinus by exam today - intolerant to Flecainide - managed with CCB therapy and anticoagulation. Now switching over to Eliquis.   5. Anemia - chronic   6. Obesity - she is down 7 pounds.   7. HLD - now on Crestor - seems to be tolerating ok - nice response noted.   8. DM - per PCP  9. COVID-19 Education: The signs and symptoms of COVID-19 were discussed with the patient and how to seek care for testing (follow up with PCP or arrange E-visit).  The importance of social distancing, staying at home, hand hygiene and wearing a mask when out in public were discussed  today.  Current medicines are reviewed with the patient today.  The patient does not have concerns regarding medicines other than what has been noted above.  The following changes have been made:  See above.  Labs/ tests ordered today include:   No orders of the defined types were placed in this encounter.    Disposition:   FU with me in about a month to recheck her BP.   Patient is agreeable to this plan and will call if any problems develop in the interim.   SignedTruitt Merle, NP  12/01/2018 3:35 PM  [facility] [address] [place],  [place]  [address] Phone: [phone] Fax: [phone]

## 2018-12-01 ENCOUNTER — Ambulatory Visit: Payer: Medicare Other | Admitting: Nurse Practitioner

## 2018-12-01 ENCOUNTER — Other Ambulatory Visit: Payer: Self-pay

## 2018-12-01 ENCOUNTER — Encounter: Payer: Self-pay | Admitting: Nurse Practitioner

## 2018-12-01 VITALS — BP 130/60 | HR 86 | Ht 64.0 in | Wt 230.1 lb

## 2018-12-01 DIAGNOSIS — I480 Paroxysmal atrial fibrillation: Secondary | ICD-10-CM | POA: Diagnosis not present

## 2018-12-01 DIAGNOSIS — Z7901 Long term (current) use of anticoagulants: Secondary | ICD-10-CM

## 2018-12-01 DIAGNOSIS — Z7189 Other specified counseling: Secondary | ICD-10-CM

## 2018-12-01 DIAGNOSIS — I10 Essential (primary) hypertension: Secondary | ICD-10-CM

## 2018-12-01 DIAGNOSIS — R079 Chest pain, unspecified: Secondary | ICD-10-CM

## 2018-12-01 DIAGNOSIS — I5189 Other ill-defined heart diseases: Secondary | ICD-10-CM

## 2018-12-01 NOTE — Patient Instructions (Addendum)
After Visit Summary:  We will be checking the following labs today - NONE   Medication Instructions:    Continue with your current medicines. BUT  I am going to stop the Norvasc today (Amlodipine)   If you need a refill on your cardiac medications before your next appointment, please call your pharmacy.     Testing/Procedures To Be Arranged:  N/A  Follow-Up:   See me in a month    At [facility], you and your health needs are our priority.  As part of our continuing mission to provide you with exceptional heart care, we have created designated Provider Care Teams.  These Care Teams include your primary Cardiologist (physician) and Advanced Practice Providers (APPs -  Physician Assistants and Nurse Practitioners) who all work together to provide you with the care you need, when you need it.  Special Instructions:  . Stay safe, stay home, wash your hands for at least 20 seconds and wear a mask when out in public.  . It was good to talk with you today.  Marland Kitchen Keep up the good work!   Call the [place] office at [phone] if you have any questions, problems or concerns.

## 2018-12-03 ENCOUNTER — Other Ambulatory Visit: Payer: Self-pay | Admitting: Family Medicine

## 2018-12-03 DIAGNOSIS — I480 Paroxysmal atrial fibrillation: Secondary | ICD-10-CM

## 2018-12-03 DIAGNOSIS — D500 Iron deficiency anemia secondary to blood loss (chronic): Secondary | ICD-10-CM

## 2018-12-03 DIAGNOSIS — I10 Essential (primary) hypertension: Secondary | ICD-10-CM

## 2018-12-04 ENCOUNTER — Encounter: Payer: Self-pay | Admitting: Gynecology

## 2018-12-17 ENCOUNTER — Telehealth (INDEPENDENT_AMBULATORY_CARE_PROVIDER_SITE_OTHER): Payer: Self-pay

## 2018-12-23 ENCOUNTER — Ambulatory Visit: Payer: Medicare Other | Admitting: Orthotics

## 2018-12-24 ENCOUNTER — Ambulatory Visit (INDEPENDENT_AMBULATORY_CARE_PROVIDER_SITE_OTHER): Payer: Medicare Other | Admitting: Orthotics

## 2018-12-24 ENCOUNTER — Other Ambulatory Visit: Payer: Self-pay

## 2018-12-24 DIAGNOSIS — M2012 Hallux valgus (acquired), left foot: Secondary | ICD-10-CM | POA: Diagnosis not present

## 2018-12-24 DIAGNOSIS — E1142 Type 2 diabetes mellitus with diabetic polyneuropathy: Secondary | ICD-10-CM

## 2018-12-24 DIAGNOSIS — M2040 Other hammer toe(s) (acquired), unspecified foot: Secondary | ICD-10-CM

## 2018-12-24 DIAGNOSIS — M2041 Other hammer toe(s) (acquired), right foot: Secondary | ICD-10-CM | POA: Diagnosis not present

## 2018-12-24 DIAGNOSIS — M2010 Hallux valgus (acquired), unspecified foot: Secondary | ICD-10-CM

## 2018-12-24 NOTE — Progress Notes (Signed)

## 2018-12-25 ENCOUNTER — Ambulatory Visit: Payer: Medicare Other | Admitting: Pharmacist

## 2019-01-01 NOTE — Progress Notes (Deleted)
CARDIOLOGY OFFICE NOTE  Date:  01/06/2019    [name] Date of Birth: April 04, 1940 Medical Record [id number]  PCP:  [name], MD  Cardiologist:  Gillian Shields    No chief complaint on file.   History of Present Illness: Courtney Pena is a 78 y.o. female who presents today for a follow up visit. Seen for Dr. Johnsie Cancel.  She has a history of HTN, DM, HLD,obesityandOSA. Long history of PAF. OnXarelto -held in past for gastric ectasia that was ablated. She has aCHADS2VASCof at [employer]. Intolerant to flecainidedue tovisual changes.She has seen Dr. Rayann Heman in the past - declined ablation but noted sotalol could be an optionfor her. History of mild CAD noted by remote cath in 2004. Last Myoview dates back to 2018.  Last seen here in [facility] 2019by Dr. Johnsie Cancel.  I then saw her in August of 2019 for a work in for cough and congestion. Was getting ready to travel and wanted "checked out". Echo updated. Round of antibiotics given. Continuedto struggle with weight loss.Seen as a work in back in December- weight was up - lots of dietary indiscretionwith salt. She was more anemic - not taking her iron regularly. Had never started/filled her Repatha.Last seen here in the office back in February - still with lots of issues/complaints which included chest pain. We did a Myoview which was negative for ischemia - EF slightly down but had had echo in August of 2019 which showed a normal EF. Medical management was continued.   I saw her a month ago - seemed to be doing ok for the most part - more bothered by her arthritis. Few spells of AF noted. Some vague chest pain that seems to be chronic. Not clear to me why she was on 2 CCB's and we adjusted her medicines.   The patient {does/does not:200015} have symptoms concerning for COVID-19 infection (fever, chills, cough, or new shortness of breath).   Comes in today. Here with   Past  Medical History:  Diagnosis Date  . Allergy   . Anemia due to GI blood loss 08/12/2011  . Arthritis   . Atrial flutter ([place]) 12-07-2010   converted in ED with 300 mg flecainide  . CAD (coronary artery disease)    a. mild per cath in 2004;  b. nonischemic Myoview in March 2012;  c. Lex MV 1/14:  EF 66%, no ischemia  . Cataract   . Chronic anticoagulation - Xarelto started 07/07/2015, CHADS2CVASC=5 07/07/2015  . Clotting disorder ([place])   . Diastolic CHF ([place]) [id number]  . External hemorrhoids 06/07/2010  . Gastric antral vascular ectasia    source for gi bleed in 07/2011 - Xarelto stopped  . Gastritis   . GERD (gastroesophageal reflux disease)   . Hiatal hernia   . Hyperlipidemia   . Hypertension   . Hypothyroidism   . Neuromuscular disorder ([place])   . Obesity   . Paroxysmal atrial fibrillation ([facility])   . Personal history of colonic polyps 06/06/2009   cecal polyp  . Sleep apnea    does not use CPAP  . 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 Ulcer     Past Surgical History:  Procedure Laterality Date  . BREAST BIOPSY Left   . BREAST EXCISIONAL BIOPSY Left    benign  . CARDIAC CATHETERIZATION  1999&2004  . CARPAL TUNNEL RELEASE Bilateral 2003  . CATARACT EXTRACTION Bilateral   .  COLONOSCOPY    . ESOPHAGOGASTRODUODENOSCOPY  08/13/2011   Procedure: ESOPHAGOGASTRODUODENOSCOPY (EGD);  Surgeon: Gatha Mayer, MD;  Location: [facility] ENDOSCOPY;  Service: Endoscopy;  Laterality: N/A;  . POLYPECTOMY       Medications: No outpatient medications have been marked as taking for the 01/07/19 encounter (Appointment) with Burtis Junes, NP.     Allergies: Allergies  Allergen Reactions  . Lisinopril Swelling    Swelling, may have had some breathing involvement.   . Nsaids Other (See Comments)    Patient reports internal bleeding. Experienced with tolmetin.   Marland Kitchen Penicillins Shortness Of Breath and Swelling    Arm Swelling with Penicillin  (Occurred in 1960s) Breathing - throat swelling with Amoxicillin (Occurred prior to 2002)  . Sulfamethoxazole Hives and Itching    "welps all over" immediately after dose  . Actos [Pioglitazone] Swelling    Swelling all over body  . Flecainide Other (See Comments)    Blurry  vision  . Gabapentin Other (See Comments)    Caused dysphoria   . Iodinated Diagnostic Agents Itching    Pt. Developed mild itching after receiving IV cm; pt. Held; Dr. Keane Scrape recomended she take 50 mg of benadryl when she goes home-if necessary; Dr Mickey Farber recommends benadryl prior to future exams requiring contrast media, but stated other doctors may recommend another premedication prep.  . Statins Other (See Comments)    Muscle aches with multiple statins. Able to tolerate rosuvastatin.  . Losartan Potassium Other (See Comments)    Lower extremity swelling  . Zetia [Ezetimibe] Other (See Comments)    Cramps    Social History: The patient  reports that she quit smoking about 25 years ago. Her smoking use included cigarettes. She has a 60.00 pack-year smoking history. She has never used smokeless tobacco. She reports that she does not drink alcohol or use drugs.   Family History: The patient's ***family history includes Colon polyps in her daughter; Diabetes in her daughter and maternal grandfather; Heart disease in her father; Hypertension in her mother; Other in her mother; Thyroid disease in her daughter.   Review of Systems: Please see the history of present illness.   All other systems are reviewed and negative.   Physical Exam: VS:  There were no vitals taken for this visit. Marland Kitchen  BMI There is no height or weight on file to calculate BMI.  Wt Readings from Last 3 Encounters:  12/01/18 230 lb 1.9 oz (104.4 kg)  11/25/18 229 lb 9.6 oz (104.1 kg)  11/24/18 230 lb 12.8 oz (104.7 kg)    General: Pleasant. Well developed, well nourished and in no acute distress.   HEENT: Normal.  Neck: Supple, no JVD,  carotid bruits, or masses noted.  Cardiac: ***Regular rate and rhythm. No murmurs, rubs, or gallops. No edema.  Respiratory:  Lungs are clear to auscultation bilaterally with normal work of breathing.  GI: Soft and nontender.  MS: No deformity or atrophy. Gait and ROM intact.  Skin: Warm and dry. Color is normal.  Neuro:  Strength and sensation are intact and no gross focal deficits noted.  Psych: Alert, appropriate and with normal affect.   LABORATORY DATA:  EKG:  EKG {ACTION; IS/IS GI:087931 ordered today. This demonstrates ***.  Lab Results  Component Value Date   WBC 8.8 11/24/2018   HGB 10.0 (L) 11/24/2018   HCT 32.5 (L) 11/24/2018   PLT 318 11/24/2018   GLUCOSE 181 (H) 11/24/2018   CHOL 274 (H) 12/02/2017   TRIG 120  12/02/2017   HDL 65 12/02/2017   LDLDIRECT 62 11/24/2018   LDLCALC 185 (H) 12/02/2017   ALT 12 11/28/2017   AST 10 11/28/2017   NA 139 11/24/2018   K 4.4 11/24/2018   CL 103 11/24/2018   CREATININE 0.96 11/24/2018   BUN 17 11/24/2018   CO2 20 11/24/2018   TSH 2.160 11/28/2017   INR 1.06 08/14/2011   HGBA1C 8.8 (A) 11/24/2018     BNP (last 3 results) No results for input(s): BNP in the last 8760 hours.  ProBNP (last 3 results) Recent Labs    02/05/18 1507  PROBNP 247     Other Studies Reviewed Today:  Myoview Study Highlights 03/2018    Nuclear stress EF: 49%.  No T wave inversion was noted during stress.  There was no ST segment deviation noted during stress.  The study is normal.  This is a low risk study.  Normal perfusion. LVEF 49% with very mild global hypokinesis. This is a low risk study. Consider echo correlation of LVEF.   Lower Extremity Arterial Doppler Summary 03/2018: Right: Resting right ankle-brachial index is within normal range. No evidence of significant right lower extremity arterial disease. The right toe-brachial index is normal.  Left: Resting left ankle-brachial index is within normal range. No  evidence of significant left lower extremity arterial disease. The left toe-brachial index is mildly abnormal.   *See table(s) above for measurements and observations.  Electronically signed by Ena Dawley MD on 04/22/2018 at 1:54:12 PM.    EchoStudy Conclusions8/2019  - Left ventricle: The cavity size was mildly dilated. Wall thickness was normal. Systolic function was normal. The estimated ejection fraction was in the range of 55% to 60%. Wall motion was normal; there were no regional wall motion abnormalities. Doppler parameters are consistent with abnormal left ventricular relaxation (grade 1 diastolic dysfunction). Doppler parameters are consistent with high ventricular filling pressure. - Mitral valve: Calcified annulus. Mildly thickened leaflets .  Impressions:  - Normal LV systolic function; mild LVE; mild diastolic dysfunction.  Cardiac TelemetryStudy Highlights11/2018  NSR Average HR 70  No significant arrhythmias even with palpitations     Assessment/Plan:  1.Chronic atypical chest pain - low risk Myoview from earlier this year - she is doing better with CV risk factor modification - encouraged her to keep up the good work.   2. Chronic diastolic HF - needs good BP control and continued sodium restriction.   3. HTN - BP is good - not sure why she is on 2 CCB's - will stop Norvasc today. She is ACE allergic - may consider ARB therapy if needed.   4. PAF - in sinus by exam today - intolerant to Flecainide - managed with CCB therapy and anticoagulation. Now switching over to Eliquis.   5. Anemia - chronic   6. Obesity - she is down 7 pounds.   7. HLD - now on Crestor - seems to be tolerating ok - nice response noted.   8. DM - per PCP  9. COVID-19 Education: The signs and symptoms of COVID-19 were discussed with the patient and how to seek care for testing (follow up with PCP or arrange E-visit).  The importance  of social distancing, staying at home, hand hygiene and wearing a mask when out in public were discussed today.  Current medicines are reviewed with the patient today.  The patient does not have concerns regarding medicines other than what has been noted above.  The following changes have been made:  See above.  Labs/ tests ordered today include:   No orders of the defined types were placed in this encounter.    Disposition:   FU with *** in {gen number [id number] {Days to years:10300}.   Patient is agreeable to this plan and will call if any problems develop in the interim.   SignedTruitt Merle, NP  01/06/2019 1:30 PM  [facility] [address] [place], [place]  [address] Phone: [phone] Fax: [phone]

## 2019-01-07 ENCOUNTER — Ambulatory Visit: Payer: Medicare Other | Admitting: Nurse Practitioner

## 2019-01-08 ENCOUNTER — Ambulatory Visit: Payer: Medicare Other | Admitting: Pharmacist

## 2019-01-08 ENCOUNTER — Other Ambulatory Visit: Payer: Self-pay

## 2019-01-08 ENCOUNTER — Encounter: Payer: Self-pay | Admitting: Pharmacist

## 2019-01-08 DIAGNOSIS — I480 Paroxysmal atrial fibrillation: Secondary | ICD-10-CM

## 2019-01-08 DIAGNOSIS — E1122 Type 2 diabetes mellitus with diabetic chronic kidney disease: Secondary | ICD-10-CM | POA: Diagnosis not present

## 2019-01-08 DIAGNOSIS — Z7901 Long term (current) use of anticoagulants: Secondary | ICD-10-CM | POA: Diagnosis not present

## 2019-01-08 DIAGNOSIS — I10 Essential (primary) hypertension: Secondary | ICD-10-CM | POA: Diagnosis not present

## 2019-01-08 MED ORDER — APIXABAN 5 MG PO TABS: 5.0000 mg | ORAL_TABLET | Freq: Two times a day (BID) | ORAL | 0 refills | Status: DC

## 2019-01-08 MED ORDER — OZEMPIC (0.25 OR 0.5 MG/DOSE) 2 MG/1.5ML ~~LOC~~ SOPN: 0.5000 mg | PEN_INJECTOR | SUBCUTANEOUS | 0 refills | Status: DC

## 2019-01-08 NOTE — Assessment & Plan Note (Signed)
Diabetes longstanding, currently uncontrolled. Patient reports adherence with medication. Control is suboptimal due to diet. -Increased dose GLP-1 Ozempic (semaglutide). Instructed patient to take one 0.5 mg dose and one 0.25 mg dose this week. If patient can tolerate, can take two doses of 0.5 mg next week.  Sample provided. -Next A1C anticipated 02/2019.

## 2019-01-08 NOTE — Progress Notes (Signed)
S:     Chief Complaint  Patient presents with  . Medication Management    DM follow up    Patient arrives ambulating on her own and in good spirits .  Presents for diabetes evaluation, education, and management. Patient was last seen by Primary Care Provider Dr. Andria Frames on 11/24/18.   Pt reports tolerating 0.5 mg dose of Ozempic but has lost taste for foods she normally eats (including meats and vegetables). Reports losing ~5 lbs from last visit. Also checks blood pressure at home: ~140/70. Reports significantly increased post-nasal drip and increased mucus production.  Patient reports Diabetes was diagnosed over 10 years ago.   Insurance coverage/medication affordability: [employer].  Patient reports adherence with medications.  Current diabetes medications include: Glipizide 10 mg BID, metformin 2000 mg once daily, semaglutide 0.5 mg once weekly Current hypertension medications include: Metoprolol succinate 100 mg BID, diltiazem 360 mg once daily. Current hyperlipidemia medications include: Rosuvastatin 10 mg once daily  Patient denies hypoglycemic events. But reports feeling "shaky" when BG is ~100. Has only experience once since last clinic visit (11/24/18).   Patient reported dietary habits: Eats 1-2 meals/day, rarely eats 3 meals a day. Reports lunch as largest meal - enjoys sweets, but lost taste for meats and vegetables Snacks: Peanut butter, pretzels Drinks: Water, diet gingerale  Patient-reported exercise habits: No consistent regimen but has recently purchased an elliptical bike, reports not being able to exercise much to problems with her feet.   Patient reports nocturia - 3-4x/night. Patient reports neuropathy (nerve pain).  Patient denies visual changes. Patient reports self foot exams.   O:  Physical Exam Musculoskeletal:     Comments: Trace Edema  Neurological:     Mental Status: She is alert.  Psychiatric:        Mood and Affect: Mood  normal.        Behavior: Behavior normal.    Review of Systems  All other systems reviewed and are negative.  Lab Results  Component Value Date   HGBA1C 8.8 (A) 11/24/2018   There were no vitals filed for this visit.  Lipid Panel     Component Value Date/Time   CHOL 274 (H) 12/02/2017 1549   TRIG 120 12/02/2017 1549   HDL 65 12/02/2017 1549   CHOLHDL 4.2 12/02/2017 1549   CHOLHDL 4.7 01/27/2015 1238   VLDL 28 01/27/2015 1238   LDLCALC 185 (H) 12/02/2017 1549   LDLDIRECT 62 11/24/2018 1211   LDLDIRECT 178 (H) 05/12/2015 1229    Home fasting blood sugars: Reports in high 200s 2 hour post-meal/random blood sugars: Reports as high as 400. Lowest sugar at night 135,140,190. Pt reports increased sugar cravings recently.  Clinical Atherosclerotic Cardiovascular Disease (ASCVD):  The 10-year ASCVD risk score Mikey Bussing DC Brooke Bonito., et al., 2013) is: 50.3%   Values used to calculate the score:     Age: 78 years     Sex: Female     Is Non-Hispanic African American: Yes     Diabetic: Yes     Tobacco smoker: No     Systolic Blood Pressure: [id number] mmHg     Is BP treated: Yes     HDL Cholesterol: 65 mg/dL     Total Cholesterol: 274 mg/dL    A/P: Diabetes longstanding, currently uncontrolled. Patient reports adherence with medication. Control is suboptimal due to diet. -Increased dose GLP-1 Ozempic (semaglutide). Instructed patient to take one 0.5 mg dose and one 0.25 mg dose this week. If patient  can tolerate, can take two doses of 0.5 mg next week.  Sample provided. -Next A1C anticipated 02/2019.   ASCVD risk prevention in patient with diabetes. Last LDL is controlled with direct LDL of 62.Marland Kitchen ASCVD risk score is >20%  - High intensity statin indicated. Consider dose escalation of rosuvastatin if wiling in the future.  Aspirin not indicated - On apixaban 5mg  bid, samples provided -Continued rosuvastatin 10 mg.   Hypertension longstanding, currently near goal (recenttly stopped amlodipine per  cardiology).  Blood pressure goal <130/80 mmHg. Patient reports adherence to medication.    Post-nasal drip/allergic rhinitis?- suggested patient use a trial of loratadine 10mg  daily - reassess.   Written patient instructions provided.  Total time in face to face counseling 30 minutes.   Follow up PCP Dr. Andria Frames Clinic Visit in December.   Patient seen with Donald Prose, PharmD Candidate and Sherren Kerns, PharmD PGY-1 Resident.

## 2019-01-08 NOTE — Progress Notes (Signed)
Reviewed: I agree with Dr. Koval's documentation and management. 

## 2019-01-08 NOTE — Patient Instructions (Signed)
It was great to see you today!  1. This week, increase the dose of Ozempic - take one 0.5 mg dose and 0.25 mg dose.  2. Next week, if you do not have any nausea - take 2 0.5 mg dose. Please contact us if you experience severe nausea. 3. Take Claritin 10 mg daily for post-nasal drip.   Follow-up with PCP Dr. Andria Frames in December.  SGLT2 medications for diabetes - Jardiance (empagliflozin), Farxiga (dapagliflozin)

## 2019-01-29 NOTE — Progress Notes (Signed)
CARDIOLOGY OFFICE NOTE  Date:  02/04/2019    [name] Date of Birth: 17-Feb-1941 Medical Record [id number]  PCP:  [name], MD  Cardiologist:  Gillian Shields    Chief Complaint  Patient presents with  . Follow-up    History of Present Illness: Courtney Pena is a 78 y.o. female who presents today for a 2 month check. Seen for Dr. Johnsie Cancel.  She has a history of HTN, DM, HLD,obesityandOSA. Long history of PAF. OnXarelto -held in past for gastric ectasia that was ablated. She has aCHADS2VASCof at [employer]. Intolerant to flecainidedue tovisual changes.She has seen Dr. Rayann Heman in the past - declined ablation but noted sotalol could be an optionfor her. History of mild CAD noted by remote cath in 2004. Last Myoview dates back to 2018.  Last seen here in [facility] 2019by Dr. Johnsie Cancel.  I then saw her in August of 2019 for a work in for cough and congestion. Was getting ready to travel and wanted "checked out". Echo updated. Round of antibiotics given. Continuedto struggle with weight loss.Seen as a work in back in December- weight was up - lots of dietary indiscretionwith salt. She was more anemic - not taking her iron regularly. Had never started/filled her Repatha.Last seen here in the office back in February - still with lots of issues/complaints which included chest pain. We did a Myoview which was negative for ischemia - EF slightly down but had had echo in August of 2019 which showed a normal EF. Medical management was continued. Last seen in October - she was doing ok - she had been able to get on some statin.  Chronic vague chest pain. She was on 2 CCB's - not clear to me as to why - I changed her medicines and she was going to need repeat BP check.   The patient does not have symptoms concerning for COVID-19 infection (fever, chills, cough, or new shortness of breath).   Comes in today. Here alone. She has been  to the ER this weekend. Bp started going back up shortly after our visit. She has had headaches. At first, she was not checking her BP for a few weeks until this past Friday due to severe HA - BP over 200. She put herself back on Norvasc - also on high dose Diltiazem. BP is back down. Has noted more palpitations. No clear cut AF noted but was told that she had "a little a fib". She is allergic to Lisinopril - not sure what the reaction was but it is noted to be "swelling" - she is not sure if she has had prior angioedema. Not really using any Lasix. Says she can't take HCTZ - that causes gout flare up.  Losartan is noted to be in her allergies as well - ?swelling. No chest pain. Seems to be feeling ok. Headache now gone. She has been back off her iron.   Past Medical History:  Diagnosis Date  . Allergy   . Anemia due to GI blood loss 08/12/2011  . Arthritis   . Atrial flutter ([place]) 12-07-2010   converted in ED with 300 mg flecainide  . CAD (coronary artery disease)    a. mild per cath in 2004;  b. nonischemic Myoview in March 2012;  c. Lex MV 1/14:  EF 66%, no ischemia  . Cataract   . Chronic anticoagulation - Xarelto started 07/07/2015, CHADS2CVASC=5 07/07/2015  . Clotting disorder ([place])   . Diastolic  CHF ([place]) 08/16/2017  . External hemorrhoids 06/07/2010  . Gastric antral vascular ectasia    source for gi bleed in 07/2011 - Xarelto stopped  . Gastritis   . GERD (gastroesophageal reflux disease)   . Hiatal hernia   . Hyperlipidemia   . Hypertension   . Hypothyroidism   . Neuromuscular disorder ([place])   . Obesity   . Paroxysmal atrial fibrillation (H[facility])   . Personal history of colonic polyps 06/06/2009   cecal polyp  . Sleep apnea    does not use CPAP  . 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 Ulcer     Past Surgical History:  Procedure Laterality Date  . BREAST BIOPSY Left   . BREAST EXCISIONAL BIOPSY Left    benign  . CARDIAC  CATHETERIZATION  1999&2004  . CARPAL TUNNEL RELEASE Bilateral 2003  . CATARACT EXTRACTION Bilateral   . COLONOSCOPY    . ESOPHAGOGASTRODUODENOSCOPY  08/13/2011   Procedure: ESOPHAGOGASTRODUODENOSCOPY (EGD);  Surgeon: Gatha Mayer, MD;  Location: [facility] ENDOSCOPY;  Service: Endoscopy;  Laterality: N/A;  . POLYPECTOMY       Medications: Current Meds  Medication Sig  . acetaminophen (TYLENOL) 650 MG CR tablet Take 650 mg by mouth every 8 (eight) hours as needed for pain.  Marland Kitchen amLODipine (NORVASC) 5 MG tablet Take 1 tablet (5 mg total) by mouth daily.  Marland Kitchen apixaban (ELIQUIS) 5 MG TABS tablet Take 1 tablet (5 mg total) by mouth 2 (two) times daily.  Marland Kitchen Apoaequorin (PREVAGEN PO) Take 1 tablet by mouth.  . Cholecalciferol (VITAMIN D3) 2000 units TABS Take 2 capsules by mouth daily.   . Coenzyme Q10 (COQ10) 100 MG CAPS Take 100 mEq by mouth daily.   . colchicine 0.6 MG tablet Take 1 tablet (0.6 mg total) by mouth daily. (Patient taking differently: Take 0.6 mg by mouth as needed. )  . diclofenac sodium (VOLTAREN) 1 % GEL Apply 2 g topically 4 (four) times daily.  Marland Kitchen diltiazem (CARDIZEM CD) 360 MG 24 hr capsule TAKE 1 CAPSULE BY MOUTH  DAILY  . diltiazem (CARDIZEM) 30 MG tablet Take 1 tablet (30 mg total) by mouth every 4 (four) hours as needed (elevated HR as long as BP > 100).  . ferrous sulfate 325 (65 FE) MG EC tablet Take 650 mg by mouth daily.   . furosemide (LASIX) 20 MG tablet Take 20 mg by mouth daily as needed.   Marland Kitchen glipiZIDE (GLUCOTROL XL) 10 MG 24 hr tablet Take 1 tablet (10 mg total) by mouth daily with breakfast. (Patient taking differently: Take 10 mg by mouth 2 (two) times daily with a meal. )  . glucose blood (ONETOUCH ULTRA) test strip 1 Container by Other route every morning.  . Insulin Pen Needle 31G X 8 MM MISC Use with pen to inject once daily.  . Lancets (ONETOUCH ULTRASOFT) lancets Use to check blood sugar daily.  Marland Kitchen levothyroxine (SYNTHROID, LEVOTHROID) 112 MCG tablet TAKE 1 TABLET  BY MOUTH  DAILY  . meclizine (ANTIVERT) 25 MG tablet Take 25 mg by mouth 3 (three) times daily as needed for dizziness or nausea.  . metFORMIN (GLUCOPHAGE-XR) 500 MG 24 hr tablet TAKE 4 TABLETS BY MOUTH  DAILY WITH BREAKFAST (Patient taking differently: every evening. )  . metoprolol succinate (TOPROL-XL) 100 MG 24 hr tablet TAKE 1 TABLET BY MOUTH 2  TIMES DAILY. TAKE WITH OR  IMMEDIATELY FOLLOWING A  MEAL.  . Multiple Vitamin (  MULTI-VITAMINS) TABS Take daily by mouth.  . nitroGLYCERIN (NITROSTAT) 0.4 MG SL tablet Place 1 tablet (0.4 mg total) under the tongue every 5 (five) minutes as needed for chest pain (Call 911 if chest pain after three doses).  Marland Kitchen omeprazole (PRILOSEC) 40 MG capsule TAKE 1 CAPSULE BY MOUTH  DAILY  . ONE TOUCH ULTRA TEST test strip TEST 3 TIMES DAILY  . pregabalin (LYRICA) 75 MG capsule Take 1 capsule (75 mg total) by mouth 2 (two) times daily.  . rosuvastatin (CRESTOR) 10 MG tablet Take 1 tablet (10 mg total) by mouth daily.  . Semaglutide,0.25 or 0.5MG /DOS, (OZEMPIC, 0.25 OR 0.5 MG/DOSE,) 2 MG/1.5ML SOPN Inject 0.5-1 mg into the skin once a week.  . triamcinolone cream (KENALOG) 0.1 % Apply 1 application topically as needed (dermatitis).     Allergies: Allergies  Allergen Reactions  . Lisinopril Swelling    Swelling, may have had some breathing involvement.   . Nsaids Other (See Comments)    Patient reports internal bleeding. Experienced with tolmetin.   Marland Kitchen Penicillins Shortness Of Breath and Swelling    Arm Swelling with Penicillin (Occurred in 1960s) Breathing - throat swelling with Amoxicillin (Occurred prior to 2002)  . Sulfamethoxazole Hives and Itching    "welps all over" immediately after dose  . Actos [Pioglitazone] Swelling    Swelling all over body  . Flecainide Other (See Comments)    Blurry  vision  . Gabapentin Other (See Comments)    Caused dysphoria   . Iodinated Diagnostic Agents Itching    Pt. Developed mild itching after receiving IV cm; pt.  Held; Dr. Keane Scrape recomended she take 50 mg of benadryl when she goes home-if necessary; Dr Mickey Farber recommends benadryl prior to future exams requiring contrast media, but stated other doctors may recommend another premedication prep.  . Statins Other (See Comments)    Muscle aches with multiple statins. Able to tolerate rosuvastatin.  Marland Kitchen Hctz [Hydrochlorothiazide]     Gout  . Losartan Potassium Other (See Comments)    Lower extremity swelling  . Zetia [Ezetimibe] Other (See Comments)    Cramps    Social History: The patient  reports that she quit smoking about 25 years ago. Her smoking use included cigarettes. She has a 60.00 pack-year smoking history. She has never used smokeless tobacco. She reports that she does not drink alcohol or use drugs.   Family History: The patient's family history includes Colon polyps in her daughter; Diabetes in her daughter and maternal grandfather; Heart disease in her father; Hypertension in her mother; Other in her mother; Thyroid disease in her daughter.   Review of Systems: Please see the history of present illness.   All other systems are reviewed and negative.   Physical Exam: VS:  BP 122/76   Pulse 81   Ht 5\' 5"  (1.651 m)   Wt 220 lb 12 oz (100.1 kg) Comment: per patient weighed a few days ago  SpO2 96%   BMI 36.73 kg/m  .  BMI Body mass index is 36.73 kg/m.  Wt Readings from Last 3 Encounters:  02/04/19 220 lb 12 oz (100.1 kg)  02/04/19 228 lb 6.4 oz (103.6 kg)  01/31/19 222 lb (100.7 kg)    General: Pleasant. Obese. Alert and in no acute distress.   HEENT: Normal.  Neck: Supple, no JVD, carotid bruits, or masses noted.  Cardiac: Regular rate and rhythm. Trace edema.  Respiratory:  Lungs are clear to auscultation bilaterally with normal work of breathing.  GI: Soft and nontender.  MS: No deformity or atrophy. Gait and ROM intact.  Skin: Warm and dry. Color is normal.  Neuro:  Strength and sensation are intact and no gross focal  deficits noted.  Psych: Alert, appropriate and with normal affect.   LABORATORY DATA:  EKG:  EKG is not ordered today.   Lab Results  Component Value Date   WBC 7.5 01/31/2019   HGB 9.5 (L) 01/31/2019   HCT 31.4 (L) 01/31/2019   PLT 275 01/31/2019   GLUCOSE 181 (H) 01/31/2019   CHOL 274 (H) 12/02/2017   TRIG 120 12/02/2017   HDL 65 12/02/2017   LDLDIRECT 62 11/24/2018   LDLCALC 185 (H) 12/02/2017   ALT 12 11/28/2017   AST 10 11/28/2017   NA 136 01/31/2019   K 3.7 01/31/2019   CL 106 01/31/2019   CREATININE 1.06 (H) 01/31/2019   BUN 21 01/31/2019   CO2 21 (L) 01/31/2019   TSH 2.160 11/28/2017   INR 1.06 08/14/2011   HGBA1C 8.8 (A) 02/04/2019       BNP (last 3 results) No results for input(s): BNP in the last 8760 hours.  ProBNP (last 3 results) Recent Labs    02/05/18 1507  PROBNP 247     Other Studies Reviewed Today:  Myoview Study Highlights 03/2018    Nuclear stress EF: 49%.  No T wave inversion was noted during stress.  There was no ST segment deviation noted during stress.  The study is normal.  This is a low risk study.  Normal perfusion. LVEF 49% with very mild global hypokinesis. This is a low risk study. Consider echo correlation of LVEF.   Lower Extremity Arterial Doppler Summary 03/2018: Right: Resting right ankle-brachial index is within normal range. No evidence of significant right lower extremity arterial disease. The right toe-brachial index is normal.  Left: Resting left ankle-brachial index is within normal range. No evidence of significant left lower extremity arterial disease. The left toe-brachial index is mildly abnormal.    *See table(s) above for measurements and observations.  Electronically signed by Ena Dawley MD on 04/22/2018 at 1:54:12 PM.    EchoStudy Conclusions8/2019  - Left ventricle: The cavity size was mildly dilated. Wall thickness was normal. Systolic function was normal. The  estimated ejection fraction was in the range of 55% to 60%. Wall motion was normal; there were no regional wall motion abnormalities. Doppler parameters are consistent with abnormal left ventricular relaxation (grade 1 diastolic dysfunction). Doppler parameters are consistent with high ventricular filling pressure. - Mitral valve: Calcified annulus. Mildly thickened leaflets .  Impressions:  - Normal LV systolic function; mild LVE; mild diastolic dysfunction.  Cardiac TelemetryStudy Highlights11/2018  NSR Average HR 70  No significant arrhythmias even with palpitations     Assessment/Plan:  1.HTN - was uncontrolled - she has multiple medicine intolerances/allergies - few options. Will stop the Norvasc since she is on high dose Diltiazem - adding Hydralazine 25 mg BID. Stopping Norvasc again. May need to consider HTN clinic.   2. Chronic atypical chest pain - low risk Myoview from earlier this year - not endorsed today.   3. Chronic diastolic HF - needs good BP control and continued sodium restriction.   4. PAF - in sinus - some palpitations - no short acting CCB use. Remains on Eliquis.   5. Anemia - chronic - she needs to get back on her iron.   6. Obesity - she is continuing to lose some weight.   7.  HLD - now on Crestor.   8. DM - per PCP  9. COVID-19 Education: The signs and symptoms of COVID-19 were discussed with the patient and how to seek care for testing (follow up with PCP or arrange E-visit).  The importance of social distancing, staying at home, hand hygiene and wearing a mask when out in public were discussed today.  Current medicines are reviewed with the patient today.  The patient does not have concerns regarding medicines other than what has been noted above.  The following changes have been made:  See above.  Labs/ tests ordered today include:   No orders of the defined types were placed in this encounter.     Disposition:   FU with [name] in 2 to 3 months. I have asked her to monitor her BP in the interim.   Patient is agreeable to this plan and will call if any problems develop in the interim.   SignedTruitt Merle, NP  02/04/2019 3:45 PM  [facility] [address] [place], [place]  [address] Phone: [phone] Fax: [phone]

## 2019-01-31 ENCOUNTER — Telehealth: Payer: Self-pay | Admitting: Family Medicine

## 2019-01-31 ENCOUNTER — Emergency Department: Payer: Medicare Other

## 2019-01-31 ENCOUNTER — Emergency Department
Admission: EM | Admit: 2019-01-31 | Discharge: 2019-01-31 | Disposition: A | Discharge status: Home / Self Care | Payer: Medicare Other | Attending: Emergency Medicine | Admitting: Emergency Medicine

## 2019-01-31 ENCOUNTER — Other Ambulatory Visit: Payer: Self-pay

## 2019-01-31 DIAGNOSIS — E039 Hypothyroidism, unspecified: Secondary | ICD-10-CM | POA: Insufficient documentation

## 2019-01-31 DIAGNOSIS — R519 Headache, unspecified: Secondary | ICD-10-CM | POA: Diagnosis present

## 2019-01-31 DIAGNOSIS — I5032 Chronic diastolic (congestive) heart failure: Secondary | ICD-10-CM | POA: Diagnosis not present

## 2019-01-31 DIAGNOSIS — N189 Chronic kidney disease, unspecified: Secondary | ICD-10-CM | POA: Diagnosis not present

## 2019-01-31 DIAGNOSIS — E1122 Type 2 diabetes mellitus with diabetic chronic kidney disease: Secondary | ICD-10-CM | POA: Diagnosis not present

## 2019-01-31 DIAGNOSIS — I130 Hypertensive heart and chronic kidney disease with heart failure and stage 1 through stage 4 chronic kidney disease, or unspecified chronic kidney disease: Secondary | ICD-10-CM | POA: Insufficient documentation

## 2019-01-31 DIAGNOSIS — Z7984 Long term (current) use of oral hypoglycemic drugs: Secondary | ICD-10-CM | POA: Diagnosis not present

## 2019-01-31 DIAGNOSIS — Z7901 Long term (current) use of anticoagulants: Secondary | ICD-10-CM | POA: Diagnosis not present

## 2019-01-31 DIAGNOSIS — I10 Essential (primary) hypertension: Secondary | ICD-10-CM

## 2019-01-31 DIAGNOSIS — I2510 Atherosclerotic heart disease of native coronary artery without angina pectoris: Secondary | ICD-10-CM | POA: Insufficient documentation

## 2019-01-31 DIAGNOSIS — Z87891 Personal history of nicotine dependence: Secondary | ICD-10-CM | POA: Insufficient documentation

## 2019-01-31 DIAGNOSIS — Z79899 Other long term (current) drug therapy: Secondary | ICD-10-CM | POA: Diagnosis not present

## 2019-01-31 LAB — BASIC METABOLIC PANEL
Anion gap: 9 (ref 5–15)
BUN: 21 mg/dL (ref 8–23)
CO2: 21 mmol/L — ABNORMAL LOW (ref 22–32)
Calcium: 9.2 mg/dL (ref 8.9–10.3)
Chloride: 106 mmol/L (ref 98–111)
Creatinine, Ser: 1.06 mg/dL — ABNORMAL HIGH (ref 0.44–1.00)
GFR calc Af Amer: 58 mL/min — ABNORMAL LOW (ref >60)
GFR calc non Af Amer: 50 mL/min — ABNORMAL LOW (ref >60)
Glucose, Bld: 181 mg/dL — ABNORMAL HIGH (ref 70–99)
Potassium: 3.7 mmol/L (ref 3.5–5.1)
Sodium: 136 mmol/L (ref 135–145)

## 2019-01-31 LAB — CBC
HCT: 31.4 % — ABNORMAL LOW (ref 36.0–46.0)
Hemoglobin: 9.5 g/dL — ABNORMAL LOW (ref 12.0–15.0)
MCH: 24.8 pg — ABNORMAL LOW (ref 26.0–34.0)
MCHC: 30.3 g/dL (ref 30.0–36.0)
MCV: 82 fL (ref 80.0–100.0)
Platelets: 275 10*3/uL (ref 150–400)
RBC: 3.83 MIL/uL — ABNORMAL LOW (ref 3.87–5.11)
RDW: 18.7 % — ABNORMAL HIGH (ref 11.5–15.5)
WBC: 7.5 10*3/uL (ref 4.0–10.5)
nRBC: 0 % (ref 0.0–0.2)

## 2019-01-31 MED ORDER — AMLODIPINE BESYLATE 5 MG PO TABS: 5.0000 mg | ORAL_TABLET | Freq: Every day | ORAL | 0 refills | Status: DC

## 2019-01-31 MED ORDER — FAMOTIDINE 20 MG PO TABS
20.0000 mg | ORAL_TABLET | Freq: Once | ORAL | Status: AC
  Administered 2019-01-31: 20:00:00 20 mg via ORAL
  Filled 2019-01-31: qty 1

## 2019-01-31 MED ORDER — METOCLOPRAMIDE HCL 10 MG PO TABS
10.0000 mg | ORAL_TABLET | Freq: Once | ORAL | Status: AC
  Administered 2019-01-31: 10 mg via ORAL
  Filled 2019-01-31: qty 1

## 2019-01-31 NOTE — Telephone Encounter (Signed)
**  After Hours/ Emergency Line Call**  Received a call to report that [place].  1-2 weeks of headaches, but didn't start checking BPs until 12/3.  Headache on top of head and into forehead, on left side of head hurts into her face.  Doesn't think that she can read as well x 2 weeks.  Went to the eye doctor, her retina specialist, eye exam was normal.  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  Thinks that it started after the headaches.  Has tried tylenol for the headache, helps, but doesn't take it completely away.  Yesterday "headache was off the chain, before that it was just there."  "I have a headache right now, but it is better.  I feel anxious when I have the headache."  BP yesterday 201-204/190-194, took the medication she was prescribed.  Truitt Merle d/c'ed norvasc 5mg  on 10/5 due to low BP.  Started to take norvasc again yesterday.  Last night, was 163/87, at 10PM 148-87.  Daughter stayed with her to monitor her.  164/94 and pulse 72 right now.  No CP or SOB.  CBGs have been 190-260, no hypoglycemic.  Started Ozempic recently.  Changed from Xarelto to Eliquis.  No recent difficulty with balance.  No recent head trauma.    Recommended that patient be evaluated at Urgent Care or ED with report of significantly elevated Bps and severe headache.  She and her daughter voiced understanding and will take patient to Urgent Care.  Also advised that severely elevated Bps with systolics to the 123456 and diastolics to the high 123XX123 should be addressed by a phsyician and that she should be seen right away if this occurs again.  Advised to call clinic on Monday to schedule follow up as it does appear she will need some medication adjustments.  Will forward to PCP.   [place] Constable, DO PGY-2, [facility] 01/31/2019 12:38 PM

## 2019-01-31 NOTE — ED Notes (Signed)
Patient transported to CT 

## 2019-01-31 NOTE — Discharge Instructions (Signed)
Your lab tests and CT scan of the head were all okay today.  Please continue taking amlodipine in addition to your other medications to control your blood pressure, and follow-up with your doctor this week for further evaluation.

## 2019-01-31 NOTE — ED Triage Notes (Addendum)
Pt c/o frontal headache off and on for several weeks, pt also c/o elevated blood pressure.  Pt states headache was worse Thursday and Friday and states her BP was elevated 204/189 at home.  Pt reports she has hx of elevated BP, pt states she had been off Amlodipine 6 weeks ago, pt states she started taking the Amlodipine again when she noticed her BP elevated.   Pt states she took Tylenol today for headache.   Pt also states she has had episodes of a-fib recently.  Pt also states she was told by a retina specialist that she has had a stroke in her right eye before.

## 2019-01-31 NOTE — ED Notes (Signed)
Family at bedside. 

## 2019-01-31 NOTE — ED Notes (Signed)
Pt states she has had ongoing headache that is currently more prominent on the left side. Pt unsure of cause but reports high blood pressure (systolic over [id number] at home reported by pt) due to recent medication changes. Pt states she had a stroke in past and is concerned. Pt denies any sob, cp, dizziness, vision changes, or deficit. Pt AOx4, laying in room, lights dimmed, blanket given per request.

## 2019-02-02 NOTE — Telephone Encounter (Signed)
Noted and agree.  I did get ER note from the visit.

## 2019-02-04 ENCOUNTER — Ambulatory Visit: Payer: Medicare Other | Admitting: Nurse Practitioner

## 2019-02-04 ENCOUNTER — Ambulatory Visit: Payer: Medicare Other | Admitting: Family Medicine

## 2019-02-04 ENCOUNTER — Encounter: Payer: Self-pay | Admitting: Nurse Practitioner

## 2019-02-04 ENCOUNTER — Other Ambulatory Visit: Payer: Self-pay

## 2019-02-04 ENCOUNTER — Encounter: Payer: Self-pay | Admitting: Family Medicine

## 2019-02-04 ENCOUNTER — Other Ambulatory Visit: Payer: Self-pay | Admitting: *Deleted

## 2019-02-04 VITALS — BP 138/78 | HR 84 | Wt 228.4 lb

## 2019-02-04 VITALS — BP 122/76 | HR 81 | Ht 65.0 in | Wt 220.8 lb

## 2019-02-04 DIAGNOSIS — I480 Paroxysmal atrial fibrillation: Secondary | ICD-10-CM

## 2019-02-04 DIAGNOSIS — Z7189 Other specified counseling: Secondary | ICD-10-CM | POA: Diagnosis not present

## 2019-02-04 DIAGNOSIS — E1122 Type 2 diabetes mellitus with diabetic chronic kidney disease: Secondary | ICD-10-CM

## 2019-02-04 DIAGNOSIS — R519 Headache, unspecified: Secondary | ICD-10-CM | POA: Diagnosis not present

## 2019-02-04 DIAGNOSIS — Z7901 Long term (current) use of anticoagulants: Secondary | ICD-10-CM

## 2019-02-04 DIAGNOSIS — D500 Iron deficiency anemia secondary to blood loss (chronic): Secondary | ICD-10-CM | POA: Diagnosis not present

## 2019-02-04 DIAGNOSIS — I5189 Other ill-defined heart diseases: Secondary | ICD-10-CM

## 2019-02-04 DIAGNOSIS — I10 Essential (primary) hypertension: Secondary | ICD-10-CM

## 2019-02-04 LAB — POCT SEDIMENTATION RATE: POCT SED RATE: 50 mm/hr — AB (ref 0–22)

## 2019-02-04 LAB — POCT GLYCOSYLATED HEMOGLOBIN (HGB A1C): HbA1c, POC (controlled diabetic range): 8.8 % — AB (ref 0.0–7.0)

## 2019-02-04 MED ORDER — APIXABAN 5 MG PO TABS: 5.0000 mg | ORAL_TABLET | Freq: Two times a day (BID) | ORAL | 0 refills | Status: DC

## 2019-02-04 MED ORDER — OZEMPIC (0.25 OR 0.5 MG/DOSE) 2 MG/1.5ML ~~LOC~~ SOPN: 0.5000 mg | PEN_INJECTOR | SUBCUTANEOUS | 0 refills | Status: DC

## 2019-02-04 MED ORDER — HYDRALAZINE HCL 25 MG PO TABS: 25.0000 mg | ORAL_TABLET | Freq: Two times a day (BID) | ORAL | 3 refills | Status: DC

## 2019-02-04 NOTE — Patient Instructions (Addendum)
Stay on the higher dose of the ozempic BP is good.  Stay on amlodipine 5 mg.   I hope the ozempic helps with slow steady weight loss. These are probably tension headaches.  I want to do one more blood test. I am not particularly worried about the headaches.  Of course, come back if they get much worse.

## 2019-02-04 NOTE — Patient Instructions (Addendum)
After Visit Summary:  We will be checking the following labs today - NONE   Medication Instructions:    Continue with your current medicines. BUT  STOP NORVASC AFTER TODAY  START HYDRALAZINE 25 MG TO TAKE TWICE A DAY - START TOMORROW   If you need a refill on your cardiac medications before your next appointment, please call your pharmacy.     Testing/Procedures To Be Arranged:  N/A  Follow-Up:   See Dr. Johnsie Cancel in about 2 to 3 months    At [facility], you and your health needs are our priority.  As part of our continuing mission to provide you with exceptional heart care, we have created designated Provider Care Teams.  These Care Teams include your primary Cardiologist (physician) and Advanced Practice Providers (APPs -  Physician Assistants and Nurse Practitioners) who all work together to provide you with the care you need, when you need it.  Special Instructions:  . Stay safe, stay home, wash your hands for at least 20 seconds and wear a mask when out in public.  . It was good to talk with you today.  . Try to keep a check on your BP for [name]. We may have to send you to the HTN clinic here for evaluation.    Call the [place] office at [phone] if you have any questions, problems or concerns.

## 2019-02-07 ENCOUNTER — Encounter: Payer: Self-pay | Admitting: Family Medicine

## 2019-02-07 NOTE — Assessment & Plan Note (Signed)
A1C stable at 8.8, not at goal.  Just started higher dose of Ozempic.  Continue with that dose.

## 2019-02-07 NOTE — Assessment & Plan Note (Signed)
Likely muscle contraction.  I added a sed rate for possibility of temporal arteritis.  Mild elevation makes unlikely.  Will not proceed to temporal artery biopsy or prednisone, which would worse diabetes.  Patient aware to call me if headaches worsen.

## 2019-02-07 NOTE — Assessment & Plan Note (Signed)
I said OK to cont amlodipine.  Subsequently seen by cards who switched to hydralazine, I am also OK with that.

## 2019-02-07 NOTE — Progress Notes (Signed)
   Subjective:    Patient ID: Courtney Pena, female    DOB: 09-13-1940, 78 y.o.   MRN: [id number]  HPI Multiple issues. 1. HBP.  Cards stopped amlodipine due to concerns about peripheral edema.  BP crept back up.  A few days ago, patient started herself back on amlodipine.  BP is back down.  Does have mild chronic ankle edema. 2. DM.  A1C=8.8 which is reasonable for her.  She is now out of the donut hole.  She just started the higher dose of Ozempic and has not yet fully realized the benefit. 3. Headache.  Bliateral frontal, Left greater than right.  Has mild chronic sinus congestion.  No fever or purulent rhinorrhea.  No visual changes.   Review of Systems     Objective:   Physical Exam Tender to percussion of both frontal sinuses.  Also tender over left temporal region and bilaterally over the occipital ridge. Lungs clear Cardiac RRR without m or g Ext 2+ edema bilaterally, wearing support stockings.       Assessment & Plan:

## 2019-02-09 NOTE — ED Provider Notes (Signed)
[facility] Emergency Department Provider Note  ____________________________________________  Time seen: Approximately 7:45 PM  I have reviewed the triage vital signs and the nursing notes.   HISTORY  Chief Complaint Headache and Hypertension  Late entry note created on February 09, 2019. History obtained from patient as well as her daughter who arrived to bedside.  HPI Courtney Pena is a 78 y.o. female with a history of atrial flutter, diastolic heart failure, CAD, hypertension who comes the ED complaining of  intermittent frontal headache bilaterally that has been recurring over the past 2 weeks.  Is been particularly prominent over the past 3 days prior to presenting to the emergency department.  No aggravating or alleviating factors.  Nonradiating.  During this time, she started checking her blood pressure and noting it to be significantly elevated to about [id number] systolic.  She reports that in the past she had been on amlodipine, but this was discontinued previously which she contributes to low blood pressures at the time on amlodipine in addition to her other medications.  Denies paresthesias weakness or vision changes.  No dizziness chest pain or neck pain.  No recent illness.     Past Medical History:  Diagnosis Date  . Allergy   . Anemia due to GI blood loss 08/12/2011  . Arthritis   . Atrial flutter ([place]) 12-07-2010   converted in ED with 300 mg flecainide  . CAD (coronary artery disease)    a. mild per cath in 2004;  b. nonischemic Myoview in March 2012;  c. Lex MV 1/14:  EF 66%, no ischemia  . Cataract   . Chronic anticoagulation - Xarelto started 07/07/2015, CHADS2CVASC=5 07/07/2015  . Clotting disorder ([place])   . Diastolic CHF ([place]) 99991111  . External hemorrhoids 06/07/2010  . Gastric antral vascular ectasia    source for gi bleed in 07/2011 - Xarelto stopped  . Gastritis   . GERD (gastroesophageal reflux disease)   . Hiatal  hernia   . Hyperlipidemia   . Hypertension   . Hypothyroidism   . Neuromuscular disorder ([place])   . Obesity   . Paroxysmal atrial fibrillation (HCC)   . Personal history of colonic polyps 06/06/2009   cecal polyp  . Sleep apnea    does not use CPAP  . Type 2 diabetes mellitus with diabetic chronic kidney disease ([place]) 04/25/2006      . Ulcer      Patient Active Problem List   Diagnosis Date Noted  . Preventative health care 11/24/2018  . History of CVA (cerebrovascular accident) 11/24/2018  . Rotator cuff syndrome of right shoulder 11/24/2018  . Lower abdominal pain 08/28/2018  . Myalgia due to statin 08/28/2018  . Diabetic retinopathy, nonproliferative, moderate ([place]) 03/07/2018  . Headache 11/28/2017  . Diastolic CHF ([place]) [id number]  . Right flank pain 03/13/2017  . Diabetic neuropathy ([place]) 10/17/2016  . Sudden hearing loss, left 09/25/2016  . Transaminitis 11/10/2015  . Chronic anticoag - Xarelto 05//2017, CHADS2CVASC=5 Eliquis 9/20 07/07/2015  . Left sided chest pain 05/12/2015  . Subcutaneous cyst 07/09/2014  . Fatigue 06/24/2014  . Chronic gout of multiple sites 03/23/2014  . Degenerative arthritis of thumb 02/12/2014  . Unspecified vitamin D deficiency 09/18/2013  . Hyperlipidemia associated with type 2 diabetes mellitus ([place]) 08/22/2012  . Pseudophakia, both eyes 03/12/2012  . Nummular eczema 10/10/2011  . 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 Anemia due to GI blood loss 08/12/2011  . Pulmonary nodule 12/26/2010  . Paroxysmal atrial  fibrillation ([place]) 12/14/2010  . After-cataract obscuring vision 11/20/2010  . Hypothyroidism 04/25/2006  . Type 2 diabetes mellitus with diabetic chronic kidney disease ([place]) 04/25/2006  . HYPERCHOLESTEROLEMIA 04/25/2006  . Morbid obesity ([place]) 04/25/2006  . HYPERTENSION, BENIGN SYSTEMIC 04/25/2006  . Coronary atherosclerosis 04/25/2006  . Reflux esophagitis 04/25/2006  . DIVERTICULOSIS OF COLON 04/25/2006  . DJD,  UNSPECIFIED 04/25/2006  . VERTIGO NOS OR DIZZINESS 04/25/2006  . Sleep apnea 04/25/2006     Past Surgical History:  Procedure Laterality Date  . BREAST BIOPSY Left   . BREAST EXCISIONAL BIOPSY Left    benign  . CARDIAC CATHETERIZATION  1999&2004  . CARPAL TUNNEL RELEASE Bilateral 2003  . CATARACT EXTRACTION Bilateral   . COLONOSCOPY    . ESOPHAGOGASTRODUODENOSCOPY  08/13/2011   Procedure: ESOPHAGOGASTRODUODENOSCOPY (EGD);  Surgeon: Gatha Mayer, MD;  Location: [facility] ENDOSCOPY;  Service: Endoscopy;  Laterality: N/A;  . POLYPECTOMY       Prior to Admission medications   Medication Sig Start Date End Date Taking? Authorizing Provider  acetaminophen (TYLENOL) 650 MG CR tablet Take 650 mg by mouth every 8 (eight) hours as needed for pain.    [provider]  amLODipine (NORVASC) 5 MG tablet Take 1 tablet (5 mg total) by mouth daily. 01/31/19   Carrie Mew, MD  apixaban (ELIQUIS) 5 MG TABS tablet Take 1 tablet (5 mg total) by mouth 2 (two) times daily. 02/04/19   [name], MD  Apoaequorin (PREVAGEN PO) Take 1 tablet by mouth.    [provider]  Cholecalciferol (VITAMIN D3) 2000 units TABS Take 2 capsules by mouth daily.     [provider]  Coenzyme Q10 (COQ10) 100 MG CAPS Take 100 mEq by mouth daily.  06/28/05   [provider]  colchicine 0.6 MG tablet Take 1 tablet (0.6 mg total) by mouth daily. Patient taking differently: Take 0.6 mg by mouth as needed.  10/28/18   Hyatt, Max T, DPM  diclofenac sodium (VOLTAREN) 1 % GEL Apply 2 g topically 4 (four) times daily. 11/25/18   [name], MD  diltiazem (CARDIZEM CD) 360 MG 24 hr capsule TAKE 1 CAPSULE BY MOUTH  DAILY 04/28/18   Hensel, Jamal Collin, MD  diltiazem (CARDIZEM) 30 MG tablet Take 1 tablet (30 mg total) by mouth every 4 (four) hours as needed (elevated HR as long as BP > 100). 10/14/17   Burtis Junes, NP  ferrous sulfate 325 (65 FE) MG EC tablet Take 650 mg by mouth daily.      [provider]  furosemide (LASIX) 20 MG tablet Take 20 mg by mouth daily as needed.     [provider]  glipiZIDE (GLUCOTROL XL) 10 MG 24 hr tablet Take 1 tablet (10 mg total) by mouth daily with breakfast. Patient taking differently: Take 10 mg by mouth 2 (two) times daily with a meal.  10/23/18   Hensel, Jamal Collin, MD  glucose blood (ONETOUCH ULTRA) test strip 1 Container by Other route every morning. 05/21/16   [provider]  hydrALAZINE (APRESOLINE) 25 MG tablet Take 1 tablet (25 mg total) by mouth 2 (two) times daily. 02/04/19 05/05/19  Burtis Junes, NP  Insulin Pen Needle 31G X 8 MM MISC Use with pen to inject once daily. 05/13/18   [name], MD  Lancets [facility] ULTRASOFT) lancets Use to check blood sugar daily. 02/13/17   [name], MD  levothyroxine (SYNTHROID, LEVOTHROID) 112 MCG tablet TAKE 1 TABLET  BY MOUTH  DAILY 04/30/18   [name], MD  meclizine (ANTIVERT) 25 MG tablet Take 25 mg by mouth 3 (three) times daily as needed for dizziness or nausea.    [provider]  metFORMIN (GLUCOPHAGE-XR) 500 MG 24 hr tablet TAKE 4 TABLETS BY MOUTH  DAILY WITH BREAKFAST Patient taking differently: every evening.  04/28/18   Philemon Kingdom, MD  metoprolol succinate (TOPROL-XL) 100 MG 24 hr tablet TAKE 1 TABLET BY MOUTH 2  TIMES DAILY. TAKE WITH OR  IMMEDIATELY FOLLOWING A  MEAL. 12/04/18   [name], MD  Multiple Vitamin (MULTI-VITAMINS) TABS Take daily by mouth. 06/28/05   [provider]  nitroGLYCERIN (NITROSTAT) 0.4 MG SL tablet Place 1 tablet (0.4 mg total) under the tongue every 5 (five) minutes as needed for chest pain (Call 911 if chest pain after three doses). 11/25/18   [name], MD  omeprazole (PRILOSEC) 40 MG capsule TAKE 1 CAPSULE BY MOUTH  DAILY 12/04/18   [name], MD  ONE TOUCH ULTRA TEST test strip TEST 3 TIMES DAILY 07/11/18   [name], MD  pregabalin (LYRICA) 75 MG capsule Take 1  capsule (75 mg total) by mouth 2 (two) times daily. 10/28/18   Hyatt, Max T, DPM  rosuvastatin (CRESTOR) 10 MG tablet Take 1 tablet (10 mg total) by mouth daily. 10/29/18   [name], MD  Semaglutide,0.25 or 0.5MG /DOS, (OZEMPIC, 0.25 OR 0.5 MG/DOSE,) 2 MG/1.5ML SOPN Inject 0.5-1 mg into the skin once a week. 02/04/19   [name], MD  triamcinolone cream (KENALOG) 0.1 % Apply 1 application topically as needed (dermatitis).    [provider]     Allergies Lisinopril, Nsaids, Penicillins, Sulfamethoxazole, Actos [pioglitazone], Flecainide, Gabapentin, Iodinated diagnostic agents, Statins, Hctz [hydrochlorothiazide], Losartan potassium, and Zetia [ezetimibe]   Family History  Problem Relation Age of Onset  . Heart disease Father   . Diabetes Maternal Grandfather   . Hypertension Mother   . Other Mother        brain tumor-benign  . Diabetes Daughter        pre-diabeties  . Colon polyps Daughter   . Thyroid disease Daughter        x 2  . Colon cancer Neg Hx   . Esophageal cancer Neg Hx   . Stomach cancer Neg Hx   . Rectal cancer Neg Hx     Social History Social History   Tobacco Use  . Smoking status: Former Smoker    Packs/day: 2.00    Years: 30.00    Pack years: 60.00    Types: Cigarettes    Quit date: 02/26/1993    Years since quitting: 25.9  . Smokeless tobacco: Never Used  Substance Use Topics  . Alcohol use: No    Alcohol/week: 0.0 standard drinks  . Drug use: No    Review of Systems  Constitutional:   No fever or chills.  ENT:   No sore throat. No rhinorrhea. Cardiovascular:   No chest pain or syncope. Respiratory:   No dyspnea or cough. Gastrointestinal:   Negative for abdominal pain, vomiting and diarrhea.  Musculoskeletal:   Negative for focal pain or swelling All other systems reviewed and are negative except as documented above in ROS and HPI.  ____________________________________________   PHYSICAL EXAM:  VITAL SIGNS: ED Triage  Vitals  Enc Vitals Group     BP 01/31/19 1519 137/64     Pulse Rate 01/31/19 1519 74  Resp 01/31/19 1519 16     Temp 01/31/19 1519 98 F (36.7 C)     Temp Source 01/31/19 1519 Oral     SpO2 01/31/19 1519 94 %     Weight 01/31/19 1526 222 lb (100.7 kg)     Height 01/31/19 1526 5\' 5"  (1.651 m)     Head Circumference --      Peak Flow --      Pain Score 01/31/19 1525 3     Pain Loc --      Pain Edu? --      Excl. in [place]? --     Vital signs reviewed, nursing assessments reviewed.   Constitutional:   Alert and oriented. Non-toxic appearance. Eyes:   Conjunctivae are normal. EOMI. PERRL.  No field cut.  No nystagmus ENT      Head:   Normocephalic and atraumatic.  Temporal arteries nontender bilaterally, symmetric pulsations.      Nose:   Wearing a mask.      Mouth/Throat:   Wearing a mask.      Neck:   No meningismus. Full ROM. Hematological/Lymphatic/Immunilogical:   No cervical lymphadenopathy. Cardiovascular:   RRR. Symmetric bilateral radial and DP pulses.  No murmurs. Cap refill less than 2 seconds. Respiratory:   Normal respiratory effort without tachypnea/retractions. Breath sounds are clear and equal bilaterally. No wheezes/rales/rhonchi. Gastrointestinal:   Soft and nontender. Non distended. There is no CVA tenderness.  No rebound, rigidity, or guarding.  Musculoskeletal:   Normal range of motion in all extremities. No joint effusions.  No lower extremity tenderness.  No edema. Neurologic:   Normal speech and language.  Motor grossly intact. No extremity drift x4. No acute focal neurologic deficits are appreciated.  Skin:    Skin is warm, dry and intact. No rash noted.  No petechiae, purpura, or bullae.  ____________________________________________    LABS (pertinent positives/negatives) (all labs ordered are listed, but only abnormal results are displayed) Labs Reviewed  BASIC METABOLIC PANEL - Abnormal; Notable for the following components:      Result Value    CO2 21 (*)    Glucose, Bld 181 (*)    Creatinine, Ser 1.06 (*)    GFR calc non Af Amer 50 (*)    GFR calc Af Amer 58 (*)    All other components within normal limits  CBC - Abnormal; Notable for the following components:   RBC 3.83 (*)    Hemoglobin 9.5 (*)    HCT 31.4 (*)    MCH 24.8 (*)    RDW 18.7 (*)    All other components within normal limits   ____________________________________________   EKG  Interpreted by me Normal sinus rhythm rate of 73.  Normal axis and intervals.  Poor R wave progression.  Normal ST segments and T waves.  ____________________________________________    RADIOLOGY  No results found. CT scan of the head unremarkable ____________________________________________   PROCEDURES Procedures  ____________________________________________  DIFFERENTIAL DIAGNOSIS   Intracranial hemorrhage, symptomatic uncontrolled hypertension  CLINICAL IMPRESSION / ASSESSMENT AND PLAN / ED COURSE  Medications ordered in the ED: Medications  metoCLOPramide (REGLAN) tablet 10 mg (10 mg Oral Given 01/31/19 1948)  famotidine (PEPCID) tablet 20 mg (20 mg Oral Given 01/31/19 1948)    Pertinent labs & imaging results that were available during my care of the patient were reviewed by me and considered in my medical decision making (see chart for details).  Courtney Pena was evaluated in Emergency Department  on 02/09/2019 for the symptoms described in the history of present illness. She was evaluated in the context of the global COVID-19 pandemic, which necessitated consideration that the patient might be at risk for infection with the SARS-CoV-2 virus that causes COVID-19. Institutional protocols and algorithms that pertain to the evaluation of patients at risk for COVID-19 are in a state of rapid change based on information released by regulatory bodies including the CDC and federal and state organizations. These policies and algorithms were followed  during the patient's care in the ED.   Patient presents with intermittent headaches ongoing for the past few weeks. Considering the patient's symptoms, medical history, and physical examination today, I have low suspicion for ischemic stroke, meningitis, encephalitis, carotid or vertebral dissection, venous sinus thrombosis, MS, intracranial hypertension, glaucoma, CRAO, CRVO, or temporal arteritis.  CT scan obtained to evaluate for intracranial hemorrhage, which was negative.  Patient given Norvasc in the ED and blood pressure improved.  Plan to follow-up closely with primary care for ongoing blood pressure management.  Stable for discharge home. .  ____________________________________________   FINAL CLINICAL IMPRESSION(S) / ED DIAGNOSES    Final diagnoses:  Hypertension, unspecified type  Bad headache     ED Discharge Orders         Ordered    amLODipine (NORVASC) 5 MG tablet  Daily     01/31/19 2102          Portions of this note were generated with dragon dictation software. Dictation errors may occur despite best attempts at proofreading.   Carrie Mew, MD 02/09/19 [id number]

## 2019-02-10 ENCOUNTER — Other Ambulatory Visit: Payer: Self-pay | Admitting: Family Medicine

## 2019-03-02 ENCOUNTER — Other Ambulatory Visit: Payer: Self-pay | Admitting: Family Medicine

## 2019-03-02 DIAGNOSIS — E1122 Type 2 diabetes mellitus with diabetic chronic kidney disease: Secondary | ICD-10-CM

## 2019-03-05 ENCOUNTER — Other Ambulatory Visit: Payer: Self-pay

## 2019-03-05 ENCOUNTER — Telehealth (INDEPENDENT_AMBULATORY_CARE_PROVIDER_SITE_OTHER): Payer: Medicare Other | Admitting: Pharmacist

## 2019-03-05 DIAGNOSIS — E1122 Type 2 diabetes mellitus with diabetic chronic kidney disease: Secondary | ICD-10-CM | POA: Diagnosis not present

## 2019-03-05 DIAGNOSIS — I480 Paroxysmal atrial fibrillation: Secondary | ICD-10-CM

## 2019-03-05 DIAGNOSIS — Z7901 Long term (current) use of anticoagulants: Secondary | ICD-10-CM | POA: Diagnosis not present

## 2019-03-05 MED ORDER — OZEMPIC (0.25 OR 0.5 MG/DOSE) 2 MG/1.5ML ~~LOC~~ SOPN: 1.0000 mg | PEN_INJECTOR | SUBCUTANEOUS | 0 refills | Status: DC

## 2019-03-05 MED ORDER — APIXABAN 5 MG PO TABS: 5.0000 mg | ORAL_TABLET | Freq: Two times a day (BID) | ORAL | 0 refills | Status: DC

## 2019-03-05 MED ORDER — SEMAGLUTIDE (1 MG/DOSE) 2 MG/1.5ML ~~LOC~~ SOPN: 1.0000 mg | PEN_INJECTOR | SUBCUTANEOUS | 3 refills | Status: DC

## 2019-03-05 MED ORDER — APIXABAN 5 MG PO TABS: 5.0000 mg | ORAL_TABLET | Freq: Two times a day (BID) | ORAL | 3 refills | Status: DC

## 2019-03-05 NOTE — Progress Notes (Signed)
    S:    Telephone visit for Follow-up of Diabetes and Anticoagulation   Patient was referred and last seen by Primary Care Provider, Dr. Andria Frames on 02/04/2019.  She is well known to Rx clinic.    Insurance coverage/medication affordability: No change.  She expressed fear of reaching the "donut hole" again this year.   Patient reports adherence with medications.  Current diabetes medications include: Semaglutide 1 mg once weekly (given as 2x 0.5mg  doses) Current hypertension medications include: amlodipine and hydralazine  Patient denies hypoglycemic events.       O: Physical Exam   ROS  Denies any issues with bleeding or bruising.    Lab Results  Component Value Date   HGBA1C 8.8 (A) 02/04/2019     Lipid Panel     Component Value Date/Time   CHOL 274 (H) 12/02/2017 1549   TRIG 120 12/02/2017 1549   HDL 65 12/02/2017 1549   CHOLHDL 4.2 12/02/2017 1549   CHOLHDL 4.7 01/27/2015 1238   VLDL 28 01/27/2015 1238   LDLCALC 185 (H) 12/02/2017 1549   LDLDIRECT 62 11/24/2018 1211   LDLDIRECT 178 (H) 05/12/2015 1229    Home fasting blood sugars: 140-150 (improved and lowest in a long time per patient report)   A/P: Diabetes longstanding and currently improved with use of Semaglutide (Ozempic) 1mg  once weely.  Able to verbalize appropriate hypoglycemia management plan. Patient appears  adherent with medication. Control is suboptimal due to sedentary lifestyle and minimal exertion.  -Continued GLP-1 Ozempic (generic name semaglutide) at 1.0mg  dose (new prescription for 1mg  dose pens sent to [facility]) - #1 sample of the 0.5mg  pen was also provided. (she is aware of dosing change) -Extensively discussed pathophysiology of diabetes, recommended lifestyle interventions, dietary effects on blood sugar control -Counseled on s/sx of and management of hypoglycemia  Chronic Anticoagulation for Atrial Fibriallation on Eliquis (apixaban) 5mg  twice daily.  Denies bleeding or bruising.   No change - samples and new prescription provided.   Patient plans to pick up samples later this AM.   Hypertension longstanding currently taking amlodipine, diltiazem and hydralazine.  Blood pressure goal = Systolic < [id number] mmHg. Patient medication adherence appears good.  Blood pressure control is suboptimal due to multi-drug intolerance.  Continue - no change at this time.    Written patient instructions provided.  Total time in telephone interaction/counseling 18 minutes.   Follow up Pharmacist/PCP - PRN.

## 2019-03-05 NOTE — Progress Notes (Signed)
Noted and agree. 

## 2019-03-05 NOTE — Progress Notes (Signed)
Reviewed: I agree with Dr. Koval's documentation and management. 

## 2019-03-25 ENCOUNTER — Telehealth: Payer: Medicare Other | Admitting: Family Medicine

## 2019-03-30 ENCOUNTER — Ambulatory Visit: Payer: Medicare Other | Admitting: Family Medicine

## 2019-04-02 ENCOUNTER — Encounter: Payer: Self-pay | Admitting: Family Medicine

## 2019-04-02 ENCOUNTER — Ambulatory Visit: Payer: Medicare Other | Admitting: Family Medicine

## 2019-04-02 ENCOUNTER — Other Ambulatory Visit: Payer: Self-pay

## 2019-04-02 DIAGNOSIS — E039 Hypothyroidism, unspecified: Secondary | ICD-10-CM

## 2019-04-02 DIAGNOSIS — M1A09X1 Idiopathic chronic gout, multiple sites, with tophus (tophi): Secondary | ICD-10-CM

## 2019-04-02 NOTE — Assessment & Plan Note (Signed)
Check TSH 

## 2019-04-02 NOTE — Progress Notes (Signed)
   CHIEF COMPLAINT / HPI:  Left finger pain and more.  1. Pain and swelling of ring finger on left hand.  Orginally red and angry.  Took colchicine and improved.  Now with mild pain and swelling.  Was previously on allopurinol.  Quit on own 2 years ago 2. Overdue for TSH to check thyroid does. 3. Has had 2 COVID appointments cancelled due to lack of vaccine supply.  I registered her for the wait list on the [place] website.    OBJECTIVE: BP 124/66   Pulse 77   Wt 226 lb 6.4 oz (102.7 kg)   SpO2 97%   BMI 37.67 kg/m   No tremor or lid lag Let hand has focal swelling at DTP joint left ring finger.  Looks like a tophus, not an abscess.  No surrounding erythema or cellulitis.  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 Likely restart allopurinol  Hypothyroidism Check TSH     [name], MD [place]

## 2019-04-02 NOTE — Patient Instructions (Signed)
I will call with lab results. I will likely restart allopurinol. 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 I put you on the wait list for the COVID vaccine.

## 2019-04-02 NOTE — Assessment & Plan Note (Signed)
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 Likely restart allopurinol

## 2019-04-03 LAB — TSH: TSH: 2.83 u[IU]/mL (ref 0.450–4.500)

## 2019-04-03 LAB — URIC ACID: Uric Acid: 7.6 mg/dL (ref 3.1–7.9)

## 2019-04-03 MED ORDER — ALLOPURINOL 100 MG PO TABS: 100.0000 mg | ORAL_TABLET | Freq: Every day | ORAL | 6 refills | Status: DC

## 2019-04-03 NOTE — Addendum Note (Signed)
Addended by: [name] on: 04/03/2019 10:58 AM   Modules accepted: Orders

## 2019-04-05 ENCOUNTER — Ambulatory Visit: Payer: Medicare Other | Attending: Internal Medicine

## 2019-04-05 DIAGNOSIS — Z23 Encounter for immunization: Secondary | ICD-10-CM | POA: Insufficient documentation

## 2019-04-05 NOTE — Progress Notes (Signed)
   Covid-19 Vaccination Clinic  Name:  Courtney Pena    MRN: [id number] DOB: 30-Jun-1940  04/05/2019  [name] was observed post Covid-19 immunization for 15 minutes without incidence. She was provided with Vaccine Information Sheet and instruction to access the V-Safe system.   [name] was instructed to call 911 with any severe reactions post vaccine: Marland Kitchen Difficulty breathing  . Swelling of your face and throat  . A fast heartbeat  . A bad rash all over your body  . Dizziness and weakness    Immunizations Administered    Name Date Dose VIS Date Route   Pfizer COVID-19 Vaccine 04/05/2019  3:20 AM 0.3 mL 02/06/2019 Intramuscular   Manufacturer: Home   Lot: [id number]   [place]: [id number]

## 2019-04-09 ENCOUNTER — Other Ambulatory Visit: Payer: Self-pay

## 2019-04-09 ENCOUNTER — Telehealth (INDEPENDENT_AMBULATORY_CARE_PROVIDER_SITE_OTHER): Payer: Medicare Other | Admitting: Pharmacist

## 2019-04-09 DIAGNOSIS — N189 Chronic kidney disease, unspecified: Secondary | ICD-10-CM

## 2019-04-09 DIAGNOSIS — E1122 Type 2 diabetes mellitus with diabetic chronic kidney disease: Secondary | ICD-10-CM

## 2019-04-09 NOTE — Progress Notes (Signed)
Noted and agree. 

## 2019-04-09 NOTE — Progress Notes (Signed)
    S:     Chief Complaint  Patient presents with  . Medication Management    Diabetes - medication supply    Patient contacted through telephone visit for Diabetes follow up and medication management.  Patient was referred and last seen by Primary Care Provider, Dr. Andria Frames on 04/02/2019.   Patient reports "Ozempic is really working" Blood sugars are reported as almost all in the 100s, miminal 200+ readings and no readings> 300.  Lowest reading was in the 90s.    Patient reports adherence with medications.   Patient denies hypoglycemic events.  We disccused continued challenges with drug costs.  She states she is in the   O:  Physical Exam N/A Phone encounter  ROS No complaints    A/P: Diabetes longstanding appears improved control with Ozempic 1 mg daily.  -Continued GLP-1 Ozempic  (generic name semaglutide) at 1 mg daily (using two 0.5mg  shots).  - Continued Metformin -Decreased dose of glipizide from 10mg  XL BID to Daily.  -Extensively discussed pathophysiology of diabetes, recommended lifestyle interventions, dietary effects on blood sugar control -Counseled on s/sx of and management of hypoglycemia  Patient is still not able to afford all of her medications.  She has adequate supply of all her medications including Ozempic and Eliquis.  She notes a reduction in income AND would like to reassess if her medications can be provided/supported by a manucturer.   I shared that I would involve a CCM - pharmacist referral to help reassess status.   Follow up Pharmacist PRN.    Patient conversation via telephone with Loleta Rose, PharmD Candidate, and Drexel Iha, PharmD,  PGY2 Pharmacy Resident.  Total time in evaluation and management 13 minutes.

## 2019-04-09 NOTE — Assessment & Plan Note (Signed)
Diabetes longstanding appears improved control with Ozempic 1 mg daily.  -Continued GLP-1 Ozempic  (generic name semaglutide) at 1 mg daily (using two 0.5mg  shots).  - Continued Metformin -Decreased dose of glipizide from 10mg  XL BID to Daily.  -Extensively discussed pathophysiology of diabetes, recommended lifestyle interventions, dietary effects on blood sugar control -Counseled on s/sx of and management of hypoglycemia

## 2019-04-20 ENCOUNTER — Telehealth: Payer: Self-pay | Admitting: Pharmacist

## 2019-04-20 NOTE — Telephone Encounter (Signed)
Called patient on 04/20/2019 at 5:03 PM to inform her that I mailed her patient assistance application for Ozempic to avoid her coming into the office due to COVID-19. Patient verbalized understanding, but stated she was planning on coming into the office anyways to pick up Ozempic and Eliquis samples. Discussed with patient we can schedule an office visit for her to fill out patient asisstance program applications and obtain samples. Scheduled appt for 04/22/2019 at 2:15 PM. Advised patient to bring proof of income documentation. Patient verbalized understanding.   Drexel Iha, PharmD PGY2 [facility]ulatory [facility] Pharmacy Resident

## 2019-04-23 ENCOUNTER — Other Ambulatory Visit: Payer: Self-pay

## 2019-04-23 ENCOUNTER — Ambulatory Visit (INDEPENDENT_AMBULATORY_CARE_PROVIDER_SITE_OTHER): Payer: Medicare Other | Admitting: Pharmacist

## 2019-04-23 ENCOUNTER — Ambulatory Visit: Payer: Medicare Other

## 2019-04-23 DIAGNOSIS — I480 Paroxysmal atrial fibrillation: Secondary | ICD-10-CM

## 2019-04-23 DIAGNOSIS — Z7901 Long term (current) use of anticoagulants: Secondary | ICD-10-CM

## 2019-04-23 DIAGNOSIS — E1122 Type 2 diabetes mellitus with diabetic chronic kidney disease: Secondary | ICD-10-CM

## 2019-04-23 MED ORDER — OZEMPIC (0.25 OR 0.5 MG/DOSE) 2 MG/1.5ML ~~LOC~~ SOPN: 1.0000 mg | PEN_INJECTOR | SUBCUTANEOUS | 0 refills | Status: DC

## 2019-04-23 MED ORDER — APIXABAN 5 MG PO TABS: 5.0000 mg | ORAL_TABLET | Freq: Two times a day (BID) | ORAL | 0 refills | Status: DC

## 2019-04-23 NOTE — Progress Notes (Signed)
    S:     Chief Complaint  Patient presents with  . Medication Management    Diabetes, Anticoagulation    Patient arrives in good spirits and ambulating with a cane.  Presents for diabetes evaluation, education, and management Patient was referred and last seen by Primary Care Provider, Dr. Andria Frames, on 04/02/2019.  She also has complaints of foot pain and left ankle pain that she describes as muscle pain.  Patient reports Diabetes was diagnosed > 10 years ago, possibly 2008.   Family/Social History: grandfather  Insurance coverage/medication affordability: Currently with Part D - high deductible and in the gap.  Requesting medication support.   Patient reports adherence with medications.  Current diabetes medications include: glipizide XL 10 mg once daily,ozempic 1mg  once weekly Current hypertension medications include: hydralazine 25 mg BID Current hyperlipidemia medications include: rosuvastatin 10 mg  Patient denies hypoglycemic events. Patient reports that she feels funny when her BG gets below 115. Patient will drink orange juice when she is feeling low.   Patient-reported exercise habits: Walking occasionally.    Patient reports nocturia (nighttime urination). Patient now only 2x night;y, but reports that before taking ozempic it was 4x nightly Patient reports neuropathy (nerve pain). Patient feels it mostly in her feet Patient reports visual changes. She reports that she has an eye exam scheduled.  Patient reports self foot exams daily.     O:  Physical Exam Constitutional:      Appearance: Normal appearance.  Neurological:     Mental Status: She is alert.  Psychiatric:        Mood and Affect: Mood normal.        Behavior: Behavior normal.    Review of Systems  Musculoskeletal:       Lower leg/foot pain bilaterally     Lab Results  Component Value Date   HGBA1C 8.8 (A) 02/04/2019   Vitals:   04/23/19 1427  BP: 136/80  Pulse: 82  SpO2: 97%    Lipid  Panel     Component Value Date/Time   CHOL 274 (H) 12/02/2017 1549   TRIG 120 12/02/2017 1549   HDL 65 12/02/2017 1549   CHOLHDL 4.2 12/02/2017 1549   CHOLHDL 4.7 01/27/2015 1238   VLDL 28 01/27/2015 1238   LDLCALC 185 (H) 12/02/2017 1549   LDLDIRECT 62 11/24/2018 1211   LDLDIRECT 178 (H) 05/12/2015 1229    Patient reports that she checks her BG once daily. Home fasting blood sugars: 120s, however this morning she reports it was 245.    A/P: Diabetes longstanding and currently improved control with use of Ozempic (semaglutide) 1mg  once weekly. Patient is able to verbalize appropriate hypoglycemia management plan. Patient appears adherent with medication.  -Continued GLP-1 Ozempic (Semaglutide) 1mg  once weekly. Samples were given during visit for 6 weeks of therapy.  -Extensively discussed  lifestyle interventions, dietary effects on blood sugar control. -Counseled on s/sx of and management of hypoglycemia -Next A1C anticipated March 2021.   Chronic anticoagulation for A-fibrillation.  Denies any significant bleeding S/sx (mild bruising) with 5mg  BID dosing of apixaban (Eliquis).   No change in therapy, samples provided for 6 weeks.     Written patient instructions provided.  Total time in face to face counseling 25 minutes.   Follow up Pharmacist Clinic Visit in 6 weeks   Patient seen with Loleta Rose, PharmD Candidate, Acey Lav, PharmD PGY-1 Resident and Drexel Iha, PharmD,  PGY2 Pharmacy Resident.

## 2019-04-23 NOTE — Assessment & Plan Note (Signed)
Diabetes longstanding and currently improved control with use of Ozempic (semaglutide) 1mg  once weekly. Patient is able to verbalize appropriate hypoglycemia management plan. Patient appears adherent with medication.  -Continued GLP-1 Ozempic (Semaglutide) 1mg  once weekly. Samples were given during visit for 6 weeks of therapy.  -Extensively discussed  lifestyle interventions, dietary effects on blood sugar control. -Counseled on s/sx of and management of hypoglycemia -Next A1C anticipated March 2021.

## 2019-04-23 NOTE — Assessment & Plan Note (Signed)
Chronic anticoagulation for A-fibrillation.  Denies any significant bleeding S/sx (mild bruising) with 5mg  BID dosing of apixaban (Eliquis).   No change in therapy, samples provided for 6 weeks.

## 2019-04-23 NOTE — Patient Instructions (Signed)
Nice to see you today.    See you again in 6 weeks.

## 2019-04-24 NOTE — Progress Notes (Signed)
CARDIOLOGY OFFICE NOTE  Date:  04/29/2019    Courtney Pena Date of Birth: 28-Dec-1940 Medical Record #[id number]  PCP:  [name], MD  Cardiologist:  Gillian Shields    No chief complaint on file.   History of Present Illness: Courtney Pena is a 79 y.o. female with a history of HTN, DM, HLD,obesityandOSA. Long history of PAF. OnXarelto -held in past for gastric ectasia that was ablated. She has aCHADS2VASCof at [employer]. Intolerant to flecainidedue tovisual changes.She has seen Dr. Rayann Heman in the past - declined ablation but noted sotalol could be an optionfor her. History of mild CAD noted by remote cath in 2004. Last Myoview dates back to 2018.  Echo 10/08/17 EF 55-60% MAC no significant valve disease  Myovue done 04/23/18 low risk no ischemia Normal ABI's 04/22/18   Indicates "swelling" with ACE, can't take HCTZ due to gout  Tends to get more headaches with elevated BP  02/04/19 Seen by NP norvasc d/c and hydralazine added   She was started on Ozembic and BS she thinks is much better. Weight down More palpitations but nothing sustained Got her COVID vaccine Widowed 4 years ago Has children in area Retired    Past Medical History:  Diagnosis Date  . Allergy   . Anemia due to GI blood loss 08/12/2011  . Arthritis   . Atrial flutter ([place]) 12-07-2010   converted in ED with 300 mg flecainide  . CAD (coronary artery disease)    a. mild per cath in 2004;  b. nonischemic Myoview in March 2012;  c. Lex MV 1/14:  EF 66%, no ischemia  . Cataract   . Chronic anticoagulation - Xarelto started 07/07/2015, CHADS2CVASC=5 07/07/2015  . Clotting disorder ([place])   . Diastolic CHF ([place]) 06/13/4079  . External hemorrhoids 06/07/2010  . Gastric antral vascular ectasia    source for gi bleed in 07/2011 - Xarelto stopped  . Gastritis   . GERD (gastroesophageal reflux disease)   . Hiatal hernia   . Hyperlipidemia   . Hypertension   .  Hypothyroidism   . Neuromuscular disorder ([place])   . Obesity   . Paroxysmal atrial fibrillation ([facility])   . Personal history of colonic polyps 06/06/2009   cecal polyp  . Sleep apnea    does not use CPAP  . Type 2 diabetes mellitus with diabetic chronic kidney disease ([place]) 04/25/2006      . Ulcer     Past Surgical History:  Procedure Laterality Date  . BREAST BIOPSY Left   . BREAST EXCISIONAL BIOPSY Left    benign  . CARDIAC CATHETERIZATION  1999&2004  . CARPAL TUNNEL RELEASE Bilateral 2003  . CATARACT EXTRACTION Bilateral   . COLONOSCOPY    . ESOPHAGOGASTRODUODENOSCOPY  08/13/2011   Procedure: ESOPHAGOGASTRODUODENOSCOPY (EGD);  Surgeon: Gatha Mayer, MD;  Location[facility] ENDOSCOPY;  Service: Endoscopy;  Laterality: N/A;  . POLYPECTOMY       Medications: No outpatient medications have been marked as taking for the 04/29/19 encounter (Office Visit) with Josue Hector, MD.     Allergies: Allergies  Allergen Reactions  . Lisinopril Swelling    Swelling, may have had some breathing involvement.   . Nsaids Other (See Comments)    Patient reports internal bleeding. Experienced with tolmetin.   Marland Kitchen Penicillins Shortness Of Breath and Swelling    Arm Swelling with Penicillin (Occurred in 1960s) Breathing - throat swelling with Amoxicillin (Occurred prior to 2002)  . Sulfamethoxazole  Hives and Itching    "welps all over" immediately after dose  . Actos [Pioglitazone] Swelling    Swelling all over body  . Flecainide Other (See Comments)    Blurry  vision  . Gabapentin Other (See Comments)    Caused dysphoria   . Hctz [Hydrochlorothiazide] Other (See Comments)    Gout  . Iodinated Diagnostic Agents Itching    Pt. Developed mild itching after receiving IV cm; pt. Held; Dr. Keane Scrape recomended she take 50 mg of benadryl when she goes home-if necessary; Dr Mickey Farber recommends benadryl prior to future exams requiring contrast media, but stated other doctors may recommend another  premedication prep.  . Statins Other (See Comments)    Muscle aches with multiple statins. Able to tolerate rosuvastatin.  . Losartan Potassium Other (See Comments)    Lower extremity swelling  . Zetia [Ezetimibe] Other (See Comments)    Cramps    Social History: The patient  reports that she quit smoking about 26 years ago. Her smoking use included cigarettes. She has a 60.00 pack-year smoking history. She has never used smokeless tobacco. She reports that she does not drink alcohol or use drugs.   Family History: The patient's family history includes Colon polyps in her daughter; Diabetes in her daughter and maternal grandfather; Heart disease in her father; Hypertension in her mother; Other in her mother; Thyroid disease in her daughter.   Review of Systems: Please see the history of present illness.   All other systems are reviewed and negative.   Physical Exam: VS:  BP 128/76   Pulse 83   Ht _0  (1.651 m)   Wt 220 lb (99.8 kg)   SpO2 98%   BMI 36.61 kg/m  .  BMI Body mass index is 36.61 kg/m.  Wt Readings from Last 3 Encounters:  04/29/19 220 lb (99.8 kg)  04/02/19 226 lb 6.4 oz (102.7 kg)  02/04/19 220 lb 12 oz (100.1 kg)    Affect appropriate Overweight black female  HEENT: normal Neck supple with no adenopathy JVP normal no bruits no thyromegaly Lungs clear with no wheezing and good diaphragmatic motion Heart:  S1/S2 no murmur, no rub, gallop or click PMI normal Abdomen: benighn, BS positve, no tenderness, no AAA no bruit.  No HSM or HJR Distal pulses intact with no bruits No edema Neuro non-focal Skin warm and dry No muscular weakness    LABORATORY DATA:  EKG:  EKG is not ordered today.   Lab Results  Component Value Date   WBC 7.5 01/31/2019   HGB 9.5 (L) 01/31/2019   HCT 31.4 (L) 01/31/2019   PLT 275 01/31/2019   GLUCOSE 181 (H) 01/31/2019   CHOL 274 (H) 12/02/2017   TRIG 120 12/02/2017   HDL 65 12/02/2017   LDLDIRECT 62 11/24/2018    LDLCALC 185 (H) 12/02/2017   ALT 12 11/28/2017   AST 10 11/28/2017   NA 136 01/31/2019   K 3.7 01/31/2019   CL 106 01/31/2019   CREATININE 1.06 (H) 01/31/2019   BUN 21 01/31/2019   CO2 21 (L) 01/31/2019   TSH 2.830 04/02/2019   INR 1.06 08/14/2011   HGBA1C 8.8 (A) 02/04/2019       BNP (last 3 results) No results for input(s): BNP in the last 8760 hours.  ProBNP (last 3 results) No results for input(s): PROBNP in the last 8760 hours.   Other Studies Reviewed Today:  Myoview Study Highlights 03/2018    Nuclear stress EF: 49%.  No  T wave inversion was noted during stress.  There was no ST segment deviation noted during stress.  The study is normal.  This is a low risk study.  Normal perfusion. LVEF 49% with very mild global hypokinesis. This is a low risk study. Consider echo correlation of LVEF.   Lower Extremity Arterial Doppler Summary 03/2018: Right: Resting right ankle-brachial index is within normal range. No evidence of significant right lower extremity arterial disease. The right toe-brachial index is normal.  Left: Resting left ankle-brachial index is within normal range. No evidence of significant left lower extremity arterial disease. The left toe-brachial index is mildly abnormal.    *See table(s) above for measurements and observations.  Electronically signed by Ena Dawley MD on 04/22/2018 at 1:54:12 PM.    EchoStudy Conclusions8/2019  - Left ventricle: The cavity size was mildly dilated. Wall thickness was normal. Systolic function was normal. The estimated ejection fraction was in the range of 55% to 60%. Wall motion was normal; there were no regional wall motion abnormalities. Doppler parameters are consistent with abnormal left ventricular relaxation (grade 1 diastolic dysfunction). Doppler parameters are consistent with high ventricular filling pressure. - Mitral valve: Calcified annulus. Mildly thickened  leaflets .  Impressions:  - Normal LV systolic function; mild LVE; mild diastolic dysfunction.  Cardiac TelemetryStudy Highlights11/2018  NSR Average HR 70  No significant arrhythmias even with palpitations     Assessment/Plan:  1.HTN - Rx complicated by drug intolerances improved on hydralazine   2. Chronic atypical chest pain - normal myovue 04/23/18 observe   3. Chronic diastolic HF - needs good BP control and continued sodium restriction.avoids diuretic due to gout    4. PAF - in sinus - continue cardizem and elquis   5. Anemia - chronic - continue iron   6. Obesity - she is continuing to lose some weight.   7. HLD - now on Crestor. LDL marked improvement 62   8. DM - Discussed low carb diet.  Target hemoglobin A1c is 6.5 or less.  Continue current medications.             A1c 8.8 on 02/04/19   9. COVID-19 Education: The signs and symptoms of COVID-19 were discussed with the patient and how to seek care for testing (follow up with PCP or arrange E-visit).  The importance of social distancing, staying at home, hand hygiene and wearing a mask when out in public were discussed today.  Current medicines are reviewed with the patient today.  The patient does not have concerns regarding medicines other than what has been noted above.  The following changes have been made:  See above.  Labs/ tests ordered today include:   No orders of the defined types were placed in this encounter.    Disposition:   FU in a year    Patient is agreeable to this plan and will call if any problems develop in the interim.   Signed: [name], MD  04/29/2019 2:13 PM  [facility] [address] [place],   [address] Phone: [phone] Fax: [phone]

## 2019-04-24 NOTE — Progress Notes (Signed)
Reviewed: I agree with Dr. Koval's documentation and management. 

## 2019-04-27 ENCOUNTER — Telehealth (INDEPENDENT_AMBULATORY_CARE_PROVIDER_SITE_OTHER): Payer: Medicare Other | Admitting: Family Medicine

## 2019-04-27 DIAGNOSIS — E0842 Diabetes mellitus due to underlying condition with diabetic polyneuropathy: Secondary | ICD-10-CM | POA: Diagnosis not present

## 2019-04-27 NOTE — Assessment & Plan Note (Signed)
>>  ASSESSMENT AND PLAN FOR TYPE 2 DIABETES MELLITUS WITH DIABETIC NEUROPATHY, UNSPECIFIED ([facility]) WRITTEN ON 04/27/2019  3:37 PM BY HENSEL, Santiago Bumpers, MD  Start pregabalin

## 2019-04-27 NOTE — Assessment & Plan Note (Signed)
Start pregabalin

## 2019-04-27 NOTE — Progress Notes (Signed)
Agrees to phone call.  Would have been willing to do video visit, "but I am not dressed yet."   C/O two weeks of foot and leg pain. Longstanding diabetic neuropathy.  Also Hx  Of gout.  Began with pain in left great toe.  Pain was severe and exquisite.  Started taking cochicine.  Pain has subsequently morphed into bilateral foot pain and tingling.  Right leg will at times jerk.  No skin breakdown.  No swelling. States blood sugars are running great on her new diabetic regimen.  "I'm sure my A1C will be down.   While it was not on my list, the patient states that she has some pregabalin, which she has not been taking.    Seems much more likely pain is from diabetic neuropathy rather than gout. Rec: Continue allopurinol Stop cochicine.   Start pregabalin.    She gets her second COVID vaccine tomorrow.  I will see her in 2-4 weeks face-to-face for further evaluation.    Duration of phone call was 23 minutes.

## 2019-04-27 NOTE — Patient Instructions (Signed)
Stop the cochicine Start pregabalin I am excited that you will get your second COVID vaccination See me in 2-4 weeks.

## 2019-04-29 ENCOUNTER — Encounter: Payer: Self-pay | Admitting: Cardiovascular Disease

## 2019-04-29 ENCOUNTER — Other Ambulatory Visit: Payer: Self-pay

## 2019-04-29 ENCOUNTER — Ambulatory Visit: Payer: Medicare Other | Admitting: Cardiovascular Disease

## 2019-04-29 VITALS — BP 128/76 | HR 83 | Ht 65.0 in | Wt 220.0 lb

## 2019-04-29 DIAGNOSIS — I480 Paroxysmal atrial fibrillation: Secondary | ICD-10-CM | POA: Diagnosis not present

## 2019-04-29 NOTE — Patient Instructions (Addendum)

## 2019-04-30 ENCOUNTER — Ambulatory Visit: Payer: Medicare Other | Attending: Internal Medicine

## 2019-04-30 DIAGNOSIS — Z23 Encounter for immunization: Secondary | ICD-10-CM | POA: Insufficient documentation

## 2019-04-30 NOTE — Progress Notes (Signed)
   Covid-19 Vaccination Clinic  Name:  Dominiqua Hills    MRN: [id number] DOB: 11/30/1940  04/30/2019  Ms. Horton was observed post Covid-19 immunization for 30 minutes based on pre-vaccination screening without incident. She was provided with Vaccine Information Sheet and instruction to access the V-Safe system.   [name] was instructed to call 911 with any severe reactions post vaccine: Marland Kitchen Difficulty breathing  . Swelling of face and throat  . A fast heartbeat  . A bad rash all over body  . Dizziness and weakness   Immunizations Administered    Name Date Dose VIS Date Route   Pfizer COVID-19 Vaccine 04/30/2019  1:07 PM 0.3 mL 02/06/2019 Intramuscular   Manufacturer: [place]   Lot: [id number]   [place]: [id number]

## 2019-05-02 ENCOUNTER — Other Ambulatory Visit: Payer: Self-pay | Admitting: Nurse Practitioner

## 2019-05-03 ENCOUNTER — Other Ambulatory Visit: Payer: Self-pay | Admitting: Internal Medicine

## 2019-05-03 ENCOUNTER — Other Ambulatory Visit: Payer: Self-pay | Admitting: Family Medicine

## 2019-05-04 NOTE — Telephone Encounter (Signed)
1.  Please schedule f/u appt 2.  Then please refill x 1, pending that appt.  

## 2019-05-04 NOTE — Telephone Encounter (Signed)
Is this ok to refill?   Patient has not been seen since 2018.  Dr Arman Filter patient.

## 2019-05-04 NOTE — Telephone Encounter (Signed)
Please advise? Sending to

## 2019-05-11 ENCOUNTER — Other Ambulatory Visit: Payer: Self-pay | Admitting: Family Medicine

## 2019-05-11 ENCOUNTER — Other Ambulatory Visit: Payer: Self-pay | Admitting: Internal Medicine

## 2019-05-11 DIAGNOSIS — E039 Hypothyroidism, unspecified: Secondary | ICD-10-CM

## 2019-05-13 ENCOUNTER — Ambulatory Visit: Payer: Medicare Other | Admitting: Family Medicine

## 2019-05-14 ENCOUNTER — Telehealth: Payer: Self-pay | Admitting: Pharmacist

## 2019-05-14 NOTE — Telephone Encounter (Signed)
05/14/2019 - 01/26/2019 (10-14 days to office) 120 day supply Ozempic 1 g

## 2019-05-14 NOTE — Telephone Encounter (Signed)
Patient Assistance Program Note (Date 05/14/2019)  Patient: Courtney Pena DOB: Jul 15, 1940 Medication (Patient Assistance Program): [place] (NovoNordisk) Enrollment Date: 05/14/2019 - 05/13/2018 PCP: Madison Hickman   Medication will be shipped in 10 business days.

## 2019-05-19 ENCOUNTER — Telehealth: Payer: Self-pay

## 2019-05-19 NOTE — Telephone Encounter (Signed)
Per patient approval, message was left on VM letting pt know the medication is here for her to pick up. Ottis Stain, CMA

## 2019-05-19 NOTE — Telephone Encounter (Signed)
-----   Message from [place] Pink, [place] sent at 05/19/2019 10:27 AM EDT ----- Regarding: patient meds This patient's ozempic came in this morning, white team please notify patient.  Thanks!

## 2019-05-20 ENCOUNTER — Other Ambulatory Visit: Payer: Self-pay | Admitting: *Deleted

## 2019-05-20 DIAGNOSIS — E1122 Type 2 diabetes mellitus with diabetic chronic kidney disease: Secondary | ICD-10-CM

## 2019-05-21 MED ORDER — METFORMIN HCL ER 500 MG PO TB24: 2000.0000 mg | ORAL_TABLET | Freq: Every day | ORAL | 3 refills | Status: DC

## 2019-05-21 MED ORDER — GLIPIZIDE ER 10 MG PO TB24: 10.0000 mg | ORAL_TABLET | Freq: Two times a day (BID) | ORAL | 3 refills | Status: DC

## 2019-05-25 ENCOUNTER — Ambulatory Visit: Payer: Medicare Other | Admitting: Pharmacist

## 2019-05-25 ENCOUNTER — Encounter: Payer: Self-pay | Admitting: Family Medicine

## 2019-05-25 ENCOUNTER — Encounter: Payer: Self-pay | Admitting: Pharmacist

## 2019-05-25 ENCOUNTER — Ambulatory Visit: Payer: Medicare Other | Admitting: Family Medicine

## 2019-05-25 ENCOUNTER — Other Ambulatory Visit: Payer: Self-pay

## 2019-05-25 VITALS — BP 130/60 | HR 71 | Ht 65.0 in | Wt 223.2 lb

## 2019-05-25 DIAGNOSIS — I5032 Chronic diastolic (congestive) heart failure: Secondary | ICD-10-CM

## 2019-05-25 DIAGNOSIS — E1122 Type 2 diabetes mellitus with diabetic chronic kidney disease: Secondary | ICD-10-CM

## 2019-05-25 DIAGNOSIS — Z7901 Long term (current) use of anticoagulants: Secondary | ICD-10-CM

## 2019-05-25 DIAGNOSIS — E7800 Pure hypercholesterolemia, unspecified: Secondary | ICD-10-CM

## 2019-05-25 DIAGNOSIS — M1A09X0 Idiopathic chronic gout, multiple sites, without tophus (tophi): Secondary | ICD-10-CM | POA: Diagnosis not present

## 2019-05-25 DIAGNOSIS — Z8673 Personal history of transient ischemic attack (TIA), and cerebral infarction without residual deficits: Secondary | ICD-10-CM | POA: Diagnosis not present

## 2019-05-25 DIAGNOSIS — I480 Paroxysmal atrial fibrillation: Secondary | ICD-10-CM | POA: Diagnosis not present

## 2019-05-25 DIAGNOSIS — E785 Hyperlipidemia, unspecified: Secondary | ICD-10-CM

## 2019-05-25 DIAGNOSIS — E1142 Type 2 diabetes mellitus with diabetic polyneuropathy: Secondary | ICD-10-CM

## 2019-05-25 DIAGNOSIS — D500 Iron deficiency anemia secondary to blood loss (chronic): Secondary | ICD-10-CM

## 2019-05-25 DIAGNOSIS — E1169 Type 2 diabetes mellitus with other specified complication: Secondary | ICD-10-CM

## 2019-05-25 LAB — POCT GLYCOSYLATED HEMOGLOBIN (HGB A1C): HbA1c, POC (controlled diabetic range): 6.7 % (ref 0.0–7.0)

## 2019-05-25 MED ORDER — APIXABAN 5 MG PO TABS: 5.0000 mg | ORAL_TABLET | Freq: Two times a day (BID) | ORAL | 0 refills | Status: DC

## 2019-05-25 MED ORDER — SEMAGLUTIDE (1 MG/DOSE) 2 MG/1.5ML ~~LOC~~ SOPN: 1.0000 mg | PEN_INJECTOR | SUBCUTANEOUS | 0 refills | Status: DC

## 2019-05-25 NOTE — Patient Instructions (Addendum)
I am delighted with your great control of the diabetes. I will call with the other blood work. Allopurinol and lyrica are drugs that you can take to feel better.  They must be taken regularly to be effective. Please have the eye doc send me a copy of your exam.

## 2019-05-25 NOTE — Assessment & Plan Note (Signed)
Diabetes longstanding currently controlled. Patient is able to verbalize appropriate hypoglycemia management plan. Patient is adherent with medication and would still like to avoid insulin. Patient's A1c down to 6.7 and experiencing some hypoglycemic event. Educated patient that although Courtney Pena is within goal with her A1c, it is suboptimal for her to experience too many lows and that a more lenient A1c of <7.5 is better for her. -Continued GLP-1 Ozempic (semaglutide) at 1 mg weekly.  -Continued metformin 4 tablets (2000 mg) by mouth daily. -Discontinued glipizide 10 mg daily.  (consider trial of lower dose if necessary) -Extensively discussed pathophysiology of diabetes, recommended lifestyle interventions, dietary effects on blood sugar control. -Counseled on s/sx of and management of hypoglycemia. -Next A1C anticipated 3 months

## 2019-05-25 NOTE — Progress Notes (Signed)
S:     Chief Complaint  Patient presents with  . Medication Management    DM    Patient arrives in good spirits, ambulating well.  Presents for diabetes evaluation, education, and management. Patient was referred and last seen by Primary Care Provider, Dr. Andria Frames, on 05/25/2019.   Patient reports Diabetes was diagnosed in 2008.   Patient reports adherence with medications.  Current diabetes medications include: glipizide, metformin, semaglutide Current hypertension medications include: amlodipine, diltiazem, hydralazine Current hyperlipidemia medications include: rosuvastatin  Patient reports hypoglycemic events. Has mentioned not eating much lately and has seen some numbers between 80-90s.  Patient reported dietary habits: Eats 1-2 meals/day. Patient reports drinking plenty of water.  O:  Physical Exam Constitutional:      Appearance: Normal appearance.  Musculoskeletal:     Right lower leg: 1+ Edema present.     Left lower leg: 1+ Edema present.     Right ankle: Swelling present.     Left ankle: Swelling present.  Neurological:     Mental Status: She is alert.  Psychiatric:        Mood and Affect: Mood normal.        Behavior: Behavior normal.        Thought Content: Thought content normal.        Judgment: Judgment normal.      Review of Systems  Cardiovascular: Positive for leg swelling.  All other systems reviewed and are negative.    Lab Results  Component Value Date   HGBA1C 6.7 05/25/2019   There were no vitals filed for this visit.  Lipid Panel     Component Value Date/Time   CHOL 274 (H) 12/02/2017 1549   TRIG 120 12/02/2017 1549   HDL 65 12/02/2017 1549   CHOLHDL 4.2 12/02/2017 1549   CHOLHDL 4.7 01/27/2015 1238   VLDL 28 01/27/2015 1238   LDLCALC 185 (H) 12/02/2017 1549   LDLDIRECT 62 11/24/2018 1211   LDLDIRECT 178 (H) 05/12/2015 1229    In office A1c today reported at 6.7. Patient reported some low sugars; 80-90s in the past  couple of weeks.   Clinical Atherosclerotic Cardiovascular Disease (ASCVD): Yes  The 10-year ASCVD risk score Mikey Bussing DC Jr., et al., 2013) is: 52.3%   Values used to calculate the score:     Age: 79 years     Sex: Female     Is Non-Hispanic African American: Yes     Diabetic: Yes     Tobacco smoker: No     Systolic Blood Pressure: [id number] mmHg     Is BP treated: Yes     HDL Cholesterol: 65 mg/dL     Total Cholesterol: 274 mg/dL    A/P: Diabetes longstanding currently controlled. Patient is able to verbalize appropriate hypoglycemia management plan. Patient is adherent with medication and would still like to avoid insulin. Patient's A1c down to 6.7 and experiencing some hypoglycemic event. Educated patient that although she is within goal with her A1c, it is suboptimal for her to experience too many lows and that a more lenient A1c of <7.5 is better for her. -Continued GLP-1 Ozempic (semaglutide) at 1 mg weekly.  -Continued metformin 4 tablets (2000 mg) by mouth daily. -Discontinued glipizide 10 mg daily.  (consider trial of lower dose if necessary) -Extensively discussed pathophysiology of diabetes, recommended lifestyle interventions, dietary effects on blood sugar control. -Counseled on s/sx of and management of hypoglycemia. -Next A1C anticipated 3 months.   ASCVD risk - primary  prevention in patient with diabetes. Last LDL is controlled. ASCVD risk score is >20%. Patient has history of intolerance to statins and has re-tried rosuvastatin and is doing well with no complaints. -Continued rosuvastatin 10 mg.    Written patient instructions provided.  Total time in face to face counseling 30 minutes.   Follow up Pharmacist Clinic Visit in 3 months.   Patient seen with Marin Roberts, PharmD PGY-1 Resident.

## 2019-05-25 NOTE — Progress Notes (Signed)
Reviewed: I agree with Dr. Koval's documentation and management. 

## 2019-05-25 NOTE — Patient Instructions (Signed)
Great to see you today.   A1C is fantastic news.   Please STOP GLIPIZIDE  Continue to take Metformin AND Ozempic at the same doses.   Ozempic use current supply then switch to the new dose 1 mg ONCE WEEKLY.    We will follow-up again in 3 months.

## 2019-05-26 ENCOUNTER — Telehealth: Payer: Self-pay | Admitting: Family Medicine

## 2019-05-26 LAB — BASIC METABOLIC PANEL
BUN/Creatinine Ratio: 24 (ref 12–28)
BUN: 23 mg/dL (ref 8–27)
CO2: 21 mmol/L (ref 20–29)
Calcium: 9.3 mg/dL (ref 8.7–10.3)
Chloride: 103 mmol/L (ref 96–106)
Creatinine, Ser: 0.94 mg/dL (ref 0.57–1.00)
GFR calc Af Amer: 67 mL/min/{1.73_m2} (ref >59)
GFR calc non Af Amer: 58 mL/min/{1.73_m2} — ABNORMAL LOW (ref >59)
Glucose: 171 mg/dL — ABNORMAL HIGH (ref 65–99)
Potassium: 4.3 mmol/L (ref 3.5–5.2)
Sodium: 140 mmol/L (ref 134–144)

## 2019-05-26 LAB — LIPID PANEL
Chol/HDL Ratio: 2.6 ratio (ref 0.0–4.4)
Cholesterol, Total: 129 mg/dL (ref 100–199)
HDL: 50 mg/dL (ref >39)
LDL Chol Calc (NIH): 56 mg/dL (ref 0–99)
Triglycerides: 128 mg/dL (ref 0–149)
VLDL Cholesterol Cal: 23 mg/dL (ref 5–40)

## 2019-05-26 LAB — CBC
Hematocrit: 32.2 % — ABNORMAL LOW (ref 34.0–46.6)
Hemoglobin: 10.1 g/dL — ABNORMAL LOW (ref 11.1–15.9)
MCH: 26.4 pg — ABNORMAL LOW (ref 26.6–33.0)
MCHC: 31.4 g/dL — ABNORMAL LOW (ref 31.5–35.7)
MCV: 84 fL (ref 79–97)
Platelets: 252 10*3/uL (ref 150–450)
RBC: 3.82 x10E6/uL (ref 3.77–5.28)
RDW: 16.5 % — ABNORMAL HIGH (ref 11.7–15.4)
WBC: 7.2 10*3/uL (ref 3.4–10.8)

## 2019-05-26 LAB — URIC ACID: Uric Acid: 8.5 mg/dL — ABNORMAL HIGH (ref 3.1–7.9)

## 2019-05-26 NOTE — Telephone Encounter (Signed)
Called.  Encourage to start taking allopurinol again.

## 2019-05-27 ENCOUNTER — Encounter: Payer: Self-pay | Admitting: Family Medicine

## 2019-05-27 ENCOUNTER — Other Ambulatory Visit: Payer: Self-pay | Admitting: Family Medicine

## 2019-05-27 DIAGNOSIS — E7800 Pure hypercholesterolemia, unspecified: Secondary | ICD-10-CM

## 2019-05-27 NOTE — Assessment & Plan Note (Signed)
Stable.  Foot exam done

## 2019-05-27 NOTE — Telephone Encounter (Signed)
Correction to prior note - NovoNordisk Enrollment Date 05/14/2019 - 05/12/2020

## 2019-05-27 NOTE — Assessment & Plan Note (Signed)
On iron and hgb holding steady.  No change.  Continue apixiban.

## 2019-05-27 NOTE — Assessment & Plan Note (Signed)
Diabetic on statin with good looking lipid panel

## 2019-05-27 NOTE — Assessment & Plan Note (Signed)
Wt loss has been good over time.  Still BMI=37.  Has plans to increase activity post COVID.

## 2019-05-27 NOTE — Progress Notes (Signed)
    SUBJECTIVE:   CHIEF COMPLAINT / HPI:   Diabetes and more.   1. DM.  [place].  She is at her best every A1C.  She is very please with the current regimen (except for cost)  No complaints.  Due for foot exam.  States she has had eye exam.   2. Gout.  No recent attacks.  Not taking allopurinol - no particular side effects.  She complains of pill burden.   3. Ditto lyric, not taking due to pill burden.   4. Diabetic on statin.  Finally she is tolerating.  Will check lipid panel. 5. On apixiban for a fib.  No recent bouts. 6. Chronic GI bleed on anticoag.  On iron.  No recent CBC 7. Cardiac.  No CP, DOE or leg swelling.     OBJECTIVE:   BP 130/60   Pulse 71   Ht 5\' 5"  (1.651 m)   Wt 223 lb 4 oz (101.3 kg)   SpO2 97%   BMI 37.15 kg/m   VS and wt noted. Lungs clear Cardiac RRR without m or g Ext no edema.  ASSESSMENT/PLAN:   No problem-specific Assessment & Plan notes found for this encounter.     [name], MD [place]

## 2019-05-27 NOTE — Assessment & Plan Note (Signed)
Informed that uric acid level puts her at risk.  States she will start taking the allopurinol.

## 2019-05-27 NOTE — Assessment & Plan Note (Signed)
Stable and asymptomatic  

## 2019-05-27 NOTE — Assessment & Plan Note (Signed)
Diabetic on statin with good lipid panel.

## 2019-05-27 NOTE — Assessment & Plan Note (Signed)
No recent spells.  On DOAC

## 2019-05-27 NOTE — Assessment & Plan Note (Signed)
>>  ASSESSMENT AND PLAN FOR TYPE 2 DIABETES MELLITUS WITH DIABETIC NEUROPATHY, UNSPECIFIED (HCC) WRITTEN ON 05/27/2019 11:04 AM BY HENSEL, Santiago Bumpers, MD  Stable.  Foot exam done

## 2019-07-06 ENCOUNTER — Other Ambulatory Visit: Payer: Self-pay | Admitting: Family Medicine

## 2019-07-14 ENCOUNTER — Telehealth: Payer: Self-pay | Admitting: Pharmacist

## 2019-07-14 NOTE — Telephone Encounter (Signed)
Patient calls and states she has not yet received any Eliquis through patient assistance application submission.  She reports only 3 days supply remaining.   Additional samples - 4 weeks supply (56 tablets) provided.  I will also engage assistance to inquire about status of Eliquis.   Medication Samples have been provided for the patient to pick up.  Drug name: eliquis (apixaban)       Strength: 5mg         Qty: 56 tablets  LOT: [id number]  Exp.Date: 05/26/2021  Dosing instructions: BID  The patient has been instructed regarding the correct time, dose, and frequency of taking this medication, including desired effects and most common side effects.   Janeann Forehand 1:57 PM 07/14/2019

## 2019-07-14 NOTE — Telephone Encounter (Signed)
Noted  

## 2019-07-15 ENCOUNTER — Encounter: Payer: Self-pay | Admitting: Pharmacist

## 2019-07-16 NOTE — Telephone Encounter (Signed)
opened in error

## 2019-07-21 NOTE — Telephone Encounter (Signed)
Per [employer], they do not have a record of receiving an application for patient with this name, DOB, or phone #.  I will reach out to patient and let her know we will need her to complete an application.

## 2019-07-21 NOTE — Telephone Encounter (Signed)
Spoke to patient via phone and verified pt's mailing address.  I will be mailing the [employer] PAP application to her and she is aware that once patient portion is complete she will return it to [facility] as well as a copy of her SSI/Retirement benefits letter as her proof of income. 07/21/19 10:42am

## 2019-07-30 ENCOUNTER — Telehealth: Payer: Self-pay

## 2019-07-30 DIAGNOSIS — M5416 Radiculopathy, lumbar region: Secondary | ICD-10-CM

## 2019-07-30 MED ORDER — BACLOFEN 10 MG PO TABS: 10.0000 mg | ORAL_TABLET | Freq: Three times a day (TID) | ORAL | 0 refills | Status: DC | PRN

## 2019-07-30 NOTE — Telephone Encounter (Signed)
Patient calls nurse line reporting new onset of left hip pain,10/10. Patient reports she has been painting and renovating her home, therefore having to sleep on the couch the last couple of nights. Patient contributes pain to this, as she has not had any other "obvious" injury. Patient has been taking tylenol arthritis with minimal relief. Patient reports she does not take NSAIDs due to being on blood thinners. Patient is hoping PCP can call her in some muscle relaxers. Please advise.   [facility] [employer] [facility]

## 2019-07-30 NOTE — Assessment & Plan Note (Signed)
Likely multifactorial back hip and leg pain.  Sounds mostly from low back.  Will add muscle relaxer

## 2019-07-30 NOTE — Telephone Encounter (Signed)
Called.  Actually pain is low back radiating to left hip and leg.  She has been doing some home renovation, which required more pushing and lifting and sleeping on the couch.    Pain is left sided, low back radiating to left hip and back of left leg terminating at the knee.  No bowel or bladder problems.  No weakness.  No fever.  Reviewed X rays.  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  Rt hip has degenerative changes.

## 2019-09-07 ENCOUNTER — Other Ambulatory Visit: Payer: Self-pay | Admitting: Family Medicine

## 2019-09-09 ENCOUNTER — Telehealth: Payer: Self-pay | Admitting: Family Medicine

## 2019-09-09 NOTE — Telephone Encounter (Signed)
Called and asked if I would see family members.  I agreed to be their PCP.  Instructed that they need to call and get new patient info packs.

## 2019-09-09 NOTE — Telephone Encounter (Signed)
Patient would like for Dr Andria Frames to call her. I asked if there was anything I could note as to why she would like him to call her and she said it was personal.   The best best call back number is [phone].

## 2019-09-10 ENCOUNTER — Ambulatory Visit: Payer: Medicare Other | Admitting: Family Medicine

## 2019-09-14 ENCOUNTER — Ambulatory Visit (INDEPENDENT_AMBULATORY_CARE_PROVIDER_SITE_OTHER): Payer: Medicare Other | Admitting: Family Medicine

## 2019-09-14 ENCOUNTER — Encounter: Payer: Self-pay | Admitting: Family Medicine

## 2019-09-14 ENCOUNTER — Other Ambulatory Visit: Payer: Self-pay

## 2019-09-14 ENCOUNTER — Ambulatory Visit (INDEPENDENT_AMBULATORY_CARE_PROVIDER_SITE_OTHER): Payer: Medicare Other | Admitting: Pharmacist

## 2019-09-14 ENCOUNTER — Encounter: Payer: Self-pay | Admitting: Pharmacist

## 2019-09-14 VITALS — BP 128/60 | HR 60 | Ht 65.0 in | Wt 215.6 lb

## 2019-09-14 DIAGNOSIS — E1122 Type 2 diabetes mellitus with diabetic chronic kidney disease: Secondary | ICD-10-CM | POA: Diagnosis not present

## 2019-09-14 DIAGNOSIS — I2510 Atherosclerotic heart disease of native coronary artery without angina pectoris: Secondary | ICD-10-CM | POA: Diagnosis not present

## 2019-09-14 DIAGNOSIS — N644 Mastodynia: Secondary | ICD-10-CM | POA: Diagnosis not present

## 2019-09-14 DIAGNOSIS — K31819 Angiodysplasia of stomach and duodenum without bleeding: Secondary | ICD-10-CM | POA: Diagnosis not present

## 2019-09-14 DIAGNOSIS — I480 Paroxysmal atrial fibrillation: Secondary | ICD-10-CM

## 2019-09-14 DIAGNOSIS — Z7901 Long term (current) use of anticoagulants: Secondary | ICD-10-CM

## 2019-09-14 DIAGNOSIS — D500 Iron deficiency anemia secondary to blood loss (chronic): Secondary | ICD-10-CM

## 2019-09-14 LAB — POCT GLYCOSYLATED HEMOGLOBIN (HGB A1C): HbA1c, POC (controlled diabetic range): 6.5 % (ref 0.0–7.0)

## 2019-09-14 MED ORDER — BLOOD GLUCOSE METER KIT: PACK | 0 refills | Status: AC

## 2019-09-14 MED ORDER — NITROGLYCERIN 0.4 MG SL SUBL: 0.4000 mg | SUBLINGUAL_TABLET | SUBLINGUAL | 3 refills | Status: DC | PRN

## 2019-09-14 NOTE — Patient Instructions (Signed)
Keep up the great work on diet, diabetes and exercise I ordered the right mammograms I sent in refills.   I will call with results.

## 2019-09-14 NOTE — Progress Notes (Signed)
    S:     Chief Complaint  Patient presents with  . Medication Management    Apixaban / Semaglutide    Patient arrives in good spirits.   Being seen by Primary Care Provider, Dr. Andria Frames, today.   Patient reports doing well with weight loss and has no medication complaint.   Current diabetes medications include: Ozempic 1mg  once weekly on Wednesday.   Continues to take apixaban 5mg  BID.     O:  Physical Exam Constitutional:      Appearance: Normal appearance.  Pulmonary:     Effort: Pulmonary effort is normal.  Neurological:     Mental Status: She is alert.  Psychiatric:        Mood and Affect: Mood normal.        Thought Content: Thought content normal.        Judgment: Judgment normal.      Review of Systems  Gastrointestinal: Negative for melena.  All other systems reviewed and are negative.    Lab Results  Component Value Date   HGBA1C 6.5 09/14/2019   There were no vitals filed for this visit.  Lipid Panel     Component Value Date/Time   CHOL 129 05/25/2019 0954   TRIG 128 05/25/2019 0954   HDL 50 05/25/2019 0954   CHOLHDL 2.6 05/25/2019 0954   CHOLHDL 4.7 01/27/2015 1238   VLDL 28 01/27/2015 1238   LDLCALC 56 05/25/2019 0954   LDLDIRECT 62 11/24/2018 1211   LDLDIRECT 178 (H) 05/12/2015 1229     A/P: Diabetes longstanding currently with excellent control (A1c = 6.5) on Ozempic(semaglutide) 1mg  once weekly.  Patient has supply for 2 additional doses.  Patient should stop Glipizide if still taking.   Chronic anticoagulation - 5 weeks of Apixaban provided.  Medication Samples have been provided to the patient.  Drug name: Apixaban       Strength: 5mg         Qty: 70 tablets  LOT: [id number]  Exp.Date: 05/26/2021  Dosing instructions: 1 twice daily  The patient has been instructed regarding the correct time, dose, and frequency of taking this medication, including desired effects and most common side effects.   Janeann Forehand 10:12  AM 09/14/2019  Supply of both Apixaban and Semaglutide will be sent for follow-up to ensure additional supply from manufacturer.  Patient provided forms to [facility] several weeks ago for Apixaban.  Ozempic supply is likely schedule to the practice within the next 2 weeks.   Written patient instructions provided.  Total time in face to face counseling 10 minutes.   Follow up Pharmacist Clinic Visit PRN.   Patient seen with Laurey Arrow, PharmD Candidate.

## 2019-09-14 NOTE — Assessment & Plan Note (Signed)
Patient's A1c today is 6.5 and well-controlled. Continue on current medication regimen. New glucose meter prescription sent in.

## 2019-09-14 NOTE — Progress Notes (Signed)
I agre with Dr. Graylin Shiver documentation and management.

## 2019-09-14 NOTE — Progress Notes (Signed)
Reviewed: I agree with Dr. Koval's documentation and management. 

## 2019-09-14 NOTE — Assessment & Plan Note (Signed)
Patient reports pain under the R breast. Due to symptoms, a diagnostic mammogram has been ordered for the R breast. A screening mammogram has also been ordered for the L breast.

## 2019-09-14 NOTE — Progress Notes (Signed)
     SUBJECTIVE:   CHIEF COMPLAINT / HPI:   Courtney Pena (MRN: [id number]) is a 79 y.o. female with a history of T2DM who presents today for a diabetes follow up.  T2DM Patient reports she is unsure of what her A1c will be like today. She eats lots of fruits and smoothies with veggies, but she reports eating slightly more sweets lately. She says she gets little exercise due to her arthritis and neuropathy. She reports taking her medications as prescribed. She needs a new glucose meter since her current one continues to give inaccurate readings.  Breast exam Patient reports pain under her R breast. She attempted to get a screening mammogram, but she was told she needs a diagnostic mammogram since she is exhibiting symptoms and would like to be referred.  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 She has gotten an injection into her eye and is scheduled for another one soon. She wants her records sent to the [facility] for further follow up with PCP.  PERTINENT  PMH / PSH: T2DM  OBJECTIVE:   BP 128/60   Pulse 60   Ht 5\' 5"  (1.651 m)   Wt 215 lb 9.6 oz (97.8 kg)   SpO2 98%   BMI 35.88 kg/m    PHYSICAL EXAM  GEN: well developed, well-nourished, in NAD EXT: Moves all extremities equally    ASSESSMENT/PLAN:   Type 2 diabetes mellitus with diabetic chronic kidney disease ([place]) Patient's A1c today is 6.5 and well-controlled. Continue on current medication regimen. New glucose meter prescription sent in.  Breast pain, right Patient reports pain under the R breast. Due to symptoms, a diagnostic mammogram has been ordered for the R breast. A screening mammogram has also been ordered for the L breast.   Medication refill Patient reports she was out of her nitroglycerin. New prescription sent in.  Mikal Plane, [place]

## 2019-09-14 NOTE — Patient Instructions (Signed)
New Apixaban supply provided.     I will follow-up RE Ozempic and Eliquis long-term med supplies from manufacturers.

## 2019-09-14 NOTE — Assessment & Plan Note (Signed)
Chronic anticoagulation - 5 weeks of Apixaban provided.  Medication Samples have been provided to the patient.  Drug name: Apixaban       Strength: 5mg         Qty: 70 tablets  LOT: [id number]  Exp.Date: 05/26/2021  Dosing instructions: 1 twice daily  The patient has been instructed regarding the correct time, dose, and frequency of taking this medication, including desired effects and most common side effects.

## 2019-09-15 ENCOUNTER — Telehealth: Payer: Self-pay

## 2019-09-15 ENCOUNTER — Encounter: Payer: Self-pay | Admitting: Family Medicine

## 2019-09-15 ENCOUNTER — Telehealth: Payer: Self-pay | Admitting: Family Medicine

## 2019-09-15 LAB — CBC
Hematocrit: 34.5 % (ref 34.0–46.6)
Hemoglobin: 10.9 g/dL — ABNORMAL LOW (ref 11.1–15.9)
MCH: 27.5 pg (ref 26.6–33.0)
MCHC: 31.6 g/dL (ref 31.5–35.7)
MCV: 87 fL (ref 79–97)
Platelets: 311 10*3/uL (ref 150–450)
RBC: 3.97 x10E6/uL (ref 3.77–5.28)
RDW: 14.7 % (ref 11.7–15.4)
WBC: 6.4 10*3/uL (ref 3.4–10.8)

## 2019-09-15 NOTE — Assessment & Plan Note (Signed)
Rate controled and on anticoag.

## 2019-09-15 NOTE — Assessment & Plan Note (Signed)
Hgb is stable on current does of iron.

## 2019-09-15 NOTE — Progress Notes (Signed)
Patient ID: Courtney Pena, female   DOB: May 25, 1940, 79 y.o.   MRN: [id number] To augment MS3 Courtney Pena's note. 1. DM2.  Continued great control.  Some is meds but some is also some very impressive lifestyle management.  She is down a cummulative 90 lbs from her max weight.  A1C=6.5.  No sx of hypoglycemia.  Needs new meter. 2. Due for mammogram.  Having right breast pain.  She does not feel a lump.  Tried to schedule a regular mammo and breast center said I might want a diagnostic. 3. Morbid obesity.  Still qualifies (barely) with BMI=35 and co morbidity of DM and HBP. 4. Chronic a fib on eliquis.  Asymptomatic.  Has a hx of GI bleed and fe deficiency.  On replacement iron.   5. Iron deficiency anemia - see above.

## 2019-09-15 NOTE — Telephone Encounter (Signed)
Emailed patient assistance app([employer]) for Eliquis to [email or web address] today per patient request.  Patient states that she returned an application to office, but we do not have a record of receiving.  Spoke to patient on phone and advised pt to complete patient portion once more and return when completed.

## 2019-09-15 NOTE — Telephone Encounter (Signed)
Called and informed that blood count is normal.

## 2019-09-15 NOTE — Assessment & Plan Note (Signed)
No gross GI blood loss.

## 2019-09-15 NOTE — Assessment & Plan Note (Signed)
Great weight loss continues.

## 2019-09-17 ENCOUNTER — Other Ambulatory Visit: Payer: Self-pay | Admitting: Family Medicine

## 2019-09-17 DIAGNOSIS — N644 Mastodynia: Secondary | ICD-10-CM

## 2019-09-30 ENCOUNTER — Telehealth: Payer: Self-pay | Admitting: Family Medicine

## 2019-10-02 ENCOUNTER — Other Ambulatory Visit: Payer: Medicare Other

## 2019-10-08 ENCOUNTER — Telehealth: Payer: Self-pay | Admitting: *Deleted

## 2019-10-08 DIAGNOSIS — Z7901 Long term (current) use of anticoagulants: Secondary | ICD-10-CM

## 2019-10-08 DIAGNOSIS — I480 Paroxysmal atrial fibrillation: Secondary | ICD-10-CM

## 2019-10-08 MED ORDER — APIXABAN 5 MG PO TABS: 5.0000 mg | ORAL_TABLET | Freq: Two times a day (BID) | ORAL | 0 refills | Status: DC

## 2019-10-08 NOTE — Telephone Encounter (Signed)
Pt would like a call back to check the status of application and to see if she needs to do anything else. Christen Bame, CMA

## 2019-10-08 NOTE — Telephone Encounter (Signed)
Pt is requesting more samples of eliquis until her application is approved.  Placed samples up front.   Christen Bame, CMA

## 2019-10-10 NOTE — Telephone Encounter (Signed)
I do not have a record of receiving a completed application back from patient at this time, so nothing has been submitted.  I will check by box on Tues 10/13/19 at [facility] and then call patient.

## 2019-10-12 ENCOUNTER — Other Ambulatory Visit: Payer: Medicare Other

## 2019-10-12 ENCOUNTER — Telehealth: Payer: Self-pay | Admitting: Cardiovascular Disease

## 2019-10-12 MED ORDER — DILTIAZEM HCL 30 MG PO TABS: 30.0000 mg | ORAL_TABLET | ORAL | 6 refills | Status: DC | PRN

## 2019-10-12 NOTE — Telephone Encounter (Signed)
Called patient back about her message. Patient stated she has been having more episodes of a. Fib. With an elevated HR. Asked patient is she is taking the cardizem 30 mg prn for her HR. Patient stated she is out of this medication and she was not sure if she can take it with the cardizem 360 mg. Informed patient she can take it when her HR is over 100, every 4 hours. Patient stated she is doing fine today, but yesterday she had several episodes. Patient stated she also had some chest discomfort, but she thinks it was just gas. Made patient an appointment tomorrow to see Truitt Merle NP. Sent in refill for cardizem 30 mg for any elevated heart rates over 100. Patient agreed to plan.

## 2019-10-12 NOTE — Telephone Encounter (Signed)
Left message for patient to call back  

## 2019-10-12 NOTE — Progress Notes (Signed)
CARDIOLOGY OFFICE NOTE  Date:  10/13/2019    [name] Date of Birth: 1940/06/03 Medical Record #[id number]  PCP:  [name], MD  Cardiologist:  Gillian Shields   Chief Complaint  Patient presents with  . Palpitations    Work in visit - seen for Dr. Johnsie Cancel    History of Present Illness: Courtney Pena is a 79 y.o. female who presents today for a work in visit. Seen for Dr. Johnsie Cancel.   She has a history of HTN, DM, HLD,obesityandOSA. Long history of PAF. OnXarelto -held in past for gastric ectasia that was ablated. She has aCHADS2VASCof at [employer]. Intolerant to flecainidedue tovisual changes.She has seen Dr. Rayann Heman in the past - declined ablation but noted sotalol could be an optionfor her. History of mild CAD noted by remote cath in 2004. Last Myoview dates back to 2020. She has continued to struggle with weight loss. Has tended to stop her iron.   Last seen by me in December of 2020. Saw Dr. Johnsie Cancel in March. Felt to be doing ok. Had been placed on Ozempic and had lost weight.   Phone call earlier this week - "Called patient back about her message. Patient stated she has been having more episodes of a. Fib. With an elevated HR. Asked patient is she is taking the cardizem 30 mg prn for her HR. Patient stated she is out of this medication and she was not sure if she can take it with the cardizem 360 mg. Informed patient she can take it when her HR is over 100, every 4 hours. Patient stated she is doing fine today, but yesterday she had several episodes. Patient stated she also had some chest discomfort, but she thinks it was just gas. Made patient an appointment tomorrow to see Truitt Merle NP. Sent in refill for cardizem 30 mg for any elevated heart rates over 100. Patient agreed to plan."   Thus added to my schedule for today.   Comes in today. Here alone. She notes more episodes of AF over the past few weeks. Has also had  some chest pain with this. Nothing exertional. She now has her short acting CCB - did not have any of this previously. She took this yesterday and today with good response. She is worried since it seems to be more of an issue and she had previously gone a long time without any. Wondering if she should go back to the AF clinic.  Turning 80 with her next birthday. Lives alone - this causes some angst. Has continued to lose weight with Ozempic.   Past Medical History:  Diagnosis Date  . Allergy   . Anemia due to GI blood loss 08/12/2011  . Arthritis   . Atrial flutter ([place]) 12-07-2010   converted in ED with 300 mg flecainide  . CAD (coronary artery disease)    a. mild per cath in 2004;  b. nonischemic Myoview in March 2012;  c. Lex MV 1/14:  EF 66%, no ischemia  . Cataract   . Chronic anticoagulation - Xarelto started 07/07/2015, CHADS2CVASC=5 07/07/2015  . Clotting disorder ([place])   . Diastolic CHF ([place]) 5/46/5681  . External hemorrhoids 06/07/2010  . Gastric antral vascular ectasia    source for gi bleed in 07/2011 - Xarelto stopped  . Gastritis   . GERD (gastroesophageal reflux disease)   . Hiatal hernia   . Hyperlipidemia   . Hypertension   . Hypothyroidism   .  Neuromuscular disorder ([place])   . Obesity   . Paroxysmal atrial fibrillation ([facility])   . Personal history of colonic polyps 06/06/2009   cecal polyp  . Sleep apnea    does not use CPAP  . 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 Ulcer     Past Surgical History:  Procedure Laterality Date  . BREAST BIOPSY Left   . BREAST EXCISIONAL BIOPSY Left    benign  . CARDIAC CATHETERIZATION  1999&2004  . CARPAL TUNNEL RELEASE Bilateral 2003  . CATARACT EXTRACTION Bilateral   . COLONOSCOPY    . ESOPHAGOGASTRODUODENOSCOPY  08/13/2011   Procedure: ESOPHAGOGASTRODUODENOSCOPY (EGD);  Surgeon: Gatha Mayer, MD;  Location: [facility] ENDOSCOPY;  Service: Endoscopy;  Laterality: N/A;  . POLYPECTOMY        Medications: Current Meds  Medication Sig  . acetaminophen (TYLENOL) 650 MG CR tablet Take 650 mg by mouth every 8 (eight) hours as needed for pain.  Marland Kitchen apixaban (ELIQUIS) 5 MG TABS tablet Take 1 tablet (5 mg total) by mouth 2 (two) times daily.  Marland Kitchen Apoaequorin (PREVAGEN PO) Take 1 tablet by mouth.  . baclofen (LIORESAL) 10 MG tablet Take 1 tablet (10 mg total) by mouth 3 (three) times daily as needed for muscle spasms (or low back pain).  . blood glucose meter kit and supplies Dispense based on patient and insurance preference. Use up to four times daily as directed. (FOR ICD-10 E10.9, E11.9).  Marland Kitchen Cholecalciferol (VITAMIN D3) 2000 units TABS Take 2 capsules by mouth daily.   . Coenzyme Q10 (COQ10) 100 MG CAPS Take 100 mEq by mouth daily.   . colchicine 0.6 MG tablet Take 1 tablet (0.6 mg total) by mouth daily.  . diclofenac sodium (VOLTAREN) 1 % GEL Apply 2 g topically 4 (four) times daily.  Marland Kitchen diltiazem (CARDIZEM CD) 360 MG 24 hr capsule TAKE 1 CAPSULE BY MOUTH  DAILY  . diltiazem (CARDIZEM) 30 MG tablet Take 1 tablet (30 mg total) by mouth every 4 (four) hours as needed (elevated HR as long as BP > 100).  . ferrous sulfate 325 (65 FE) MG EC tablet Take 650 mg by mouth daily.   Marland Kitchen glucose blood (ONETOUCH ULTRA) test strip 1 Container by Other route every morning.  . hydrALAZINE (APRESOLINE) 25 MG tablet TAKE 1 TABLET BY MOUTH TWICE A DAY  . Insulin Pen Needle 31G X 8 MM MISC Use with pen to inject once daily.  . Lancets (ONETOUCH ULTRASOFT) lancets Use to check blood sugar daily.  Marland Kitchen levothyroxine (SYNTHROID) 112 MCG tablet TAKE 1 TABLET BY MOUTH  DAILY  . meclizine (ANTIVERT) 25 MG tablet TAKE 1 TABLET (25 MG TOTAL) BY MOUTH AS NEEDED.  . metFORMIN (GLUCOPHAGE-XR) 500 MG 24 hr tablet Take 4 tablets (2,000 mg total) by mouth daily.  . metoprolol succinate (TOPROL-XL) 100 MG 24 hr tablet TAKE 1 TABLET BY MOUTH 2  TIMES DAILY. TAKE WITH OR  IMMEDIATELY FOLLOWING A  MEAL.  . Multiple Vitamin  (MULTI-VITAMINS) TABS Take daily by mouth.  . nitroGLYCERIN (NITROSTAT) 0.4 MG SL tablet Place 1 tablet (0.4 mg total) under the tongue every 5 (five) minutes as needed for chest pain (Call 911 if chest pain after three doses).  Marland Kitchen omeprazole (PRILOSEC) 40 MG capsule TAKE 1 CAPSULE BY MOUTH  DAILY  . ONE TOUCH ULTRA TEST test strip TEST 3 TIMES DAILY  . rosuvastatin (CRESTOR) 10 MG tablet TAKE 1 TABLET (10 MG  TOTAL) BY MOUTH 2 (TWO) TIMES A WEEK.  . Semaglutide, 1 MG/DOSE, 2 MG/1.5ML SOPN Inject 1 mg into the skin once a week.  . tobramycin (TOBREX) 0.3 % ophthalmic solution Place 1 drop into both eyes as directed.  . triamcinolone cream (KENALOG) 0.1 % Apply 1 application topically as needed (dermatitis).     Allergies: Allergies  Allergen Reactions  . Lisinopril Swelling    Swelling, may have had some breathing involvement.   . Nsaids Other (See Comments)    Patient reports internal bleeding. Experienced with tolmetin.   Marland Kitchen Penicillins Shortness Of Breath and Swelling    Arm Swelling with Penicillin (Occurred in 1960s) Breathing - throat swelling with Amoxicillin (Occurred prior to 2002)  . Sulfamethoxazole Hives and Itching    "welps all over" immediately after dose  . Actos [Pioglitazone] Swelling    Swelling all over body  . Flecainide Other (See Comments)    Blurry  vision  . Gabapentin Other (See Comments)    Caused dysphoria   . Hctz [Hydrochlorothiazide] Other (See Comments)    Gout  . Iodinated Diagnostic Agents Itching    Pt. Developed mild itching after receiving IV cm; pt. Held; Dr. Keane Scrape recomended she take 50 mg of benadryl when she goes home-if necessary; Dr Mickey Farber recommends benadryl prior to future exams requiring contrast media, but stated other doctors may recommend another premedication prep.  Vania Rea [Empagliflozin] Other (See Comments)    Patient reported "it made her kidneys not work"  Patient resists retrial/alternative.  . Statins Other (See  Comments)    Muscle aches with multiple statins. Able to tolerate rosuvastatin.  . Losartan Potassium Other (See Comments)    Lower extremity swelling  . Zetia [Ezetimibe] Other (See Comments)    Cramps    Social History: The patient  reports that she quit smoking about 26 years ago. Her smoking use included cigarettes. She has a 60.00 pack-year smoking history. She has never used smokeless tobacco. She reports that she does not drink alcohol and does not use drugs.   Family History: The patient's family history includes Colon polyps in her daughter; Diabetes in her daughter and maternal grandfather; Heart disease in her father; Hypertension in her mother; Other in her mother; Thyroid disease in her daughter.   Review of Systems: Please see the history of present illness.   All other systems are reviewed and negative.   Physical Exam: VS:  BP 110/60   Pulse 78   Ht '5\' 5"'  (1.651 m)   Wt 211 lb 12.8 oz (96.1 kg)   SpO2 92%   BMI 35.25 kg/m  .  BMI Body mass index is 35.25 kg/m.  Wt Readings from Last 3 Encounters:  10/13/19 211 lb 12.8 oz (96.1 kg)  09/14/19 215 lb 9.6 oz (97.8 kg)  05/25/19 223 lb 4 oz (101.3 kg)    General: Pleasant. Alert and in no acute distress.  Her weight is continuing to come down.   Cardiac: Regular rate and rhythm. No murmurs, rubs, or gallops. No edema.  Respiratory:  Lungs are clear to auscultation bilaterally with normal work of breathing.  GI: Soft and nontender.  MS: No deformity or atrophy. Gait and ROM intact.  Skin: Warm and dry. Color is normal.  Neuro:  Strength and sensation are intact and no gross focal deficits noted.  Psych: Alert, appropriate and with normal affect.   LABORATORY DATA:  EKG:  EKG is ordered today.  Personally reviewed by me. This demonstrates  sinus with old anterior MIR - HR is 78. She does have a first degree AV block.   Lab Results  Component Value Date   WBC 6.4 09/14/2019   HGB 10.9 (L) 09/14/2019   HCT  34.5 09/14/2019   PLT 311 09/14/2019   GLUCOSE 171 (H) 05/25/2019   CHOL 129 05/25/2019   TRIG 128 05/25/2019   HDL 50 05/25/2019   LDLDIRECT 62 11/24/2018   LDLCALC 56 05/25/2019   ALT 12 11/28/2017   AST 10 11/28/2017   NA 140 05/25/2019   K 4.3 05/25/2019   CL 103 05/25/2019   CREATININE 0.94 05/25/2019   BUN 23 05/25/2019   CO2 21 05/25/2019   TSH 2.830 04/02/2019   INR 1.06 08/14/2011   HGBA1C 6.5 09/14/2019     BNP (last 3 results) No results for input(s): BNP in the last 8760 hours.  ProBNP (last 3 results) No results for input(s): PROBNP in the last 8760 hours.   Other Studies Reviewed Today:  MyoviewStudy Highlights2/2020    Nuclear stress EF: 49%.  No T wave inversion was noted during stress.  There was no ST segment deviation noted during stress.  The study is normal.  This is a low risk study.  Normal perfusion. LVEF 49% with very mild global hypokinesis. This is a low risk study. Consider echo correlation of LVEF.    Lower Extremity Arterial DopplerSummary2/2020: Right: Resting right ankle-brachial index is within normal range. No evidence of significant right lower extremity arterial disease. The right toe-brachial index is normal.  Left: Resting left ankle-brachial index is within normal range. No evidence of significant left lower extremity arterial disease. The left toe-brachial index is mildly abnormal.    *See table(s) above for measurements and observations.  Electronically signed by Ena Dawley MD on 04/22/2018 at 1:54:12 PM.   EchoStudy Conclusions8/2019  - Left ventricle: The cavity size was mildly dilated. Wall thickness was normal. Systolic function was normal. The estimated ejection fraction was in the range of 55% to 60%. Wall motion was normal; there were no regional wall motion abnormalities. Doppler parameters are consistent with abnormal left ventricular relaxation (grade 1 diastolic  dysfunction). Doppler parameters are consistent with high ventricular filling pressure. - Mitral valve: Calcified annulus. Mildly thickened leaflets .  Impressions:  - Normal LV systolic function; mild LVE; mild diastolic dysfunction.   Cardiac TelemetryStudy Highlights11/2018  NSR Average HR 70  No significant arrhythmias even with palpitations     Assessment/Plan:  1. Palpitations - she feels like she is having more AF - will place event monitor - continue with current regimen of both short and long acting CCB therapy. She remains anticoagulated and this keeps her protected. Further disposition to follow.   2. HTN - she has several medicine intolerances - she currently has good BP control - no changes made today.   3. Chronic atypical chest pain - Myoview from 2020 was stable - she does not have any exertional symptoms - seems more related to her palpitations at this time.  Would favor continued medical management.   4. Chronic diastolic HF - seems much better. BP looks good. Weight trending down.   5. Anemia - recent lab noted - she is taking her iron.   6. Obesity - actively losing weight with Ozempic.   7. PAF - see #1.  8. Chronic anticoagulation - no problems noted.   9. DM - this has improved.   Current medicines are reviewed with the patient today.  The patient does not have concerns regarding medicines other than what has been noted above.  The following changes have been made:  See above.  Labs/ tests ordered today include:    Orders Placed This Encounter  Procedures  . Basic metabolic panel  . CARDIAC EVENT MONITOR  . EKG 12-Lead     Disposition:   FU with me as planned. May need to get back to the AF clinic. Will see what her monitor shows.     Patient is agreeable to this plan and will call if any problems develop in the interim.   SignedTruitt Merle, NP  10/13/2019 3:41 PM  [facility] [address] [place], [place]  [address] Phone: [phone] Fax: [phone]

## 2019-10-12 NOTE — Telephone Encounter (Signed)
Patient c/o Palpitations:  High priority if patient c/o lightheadedness, shortness of breath, or chest pain  1) How long have you had palpitations/irregular HR/ Afib? Are you having the symptoms now? Afib - No symptoms currently  2) Are you currently experiencing lightheadedness, SOB or CP? No   3) Do you have a history of afib (atrial fibrillation) or irregular heart rhythm? Yes  4) Have you checked your BP or HR? (document readings if available):  10/11/19-   HR: 104-109 BP: 145/98 5) Are you experiencing any other symptoms? Headaches

## 2019-10-13 ENCOUNTER — Ambulatory Visit: Payer: Medicare Other | Admitting: Nurse Practitioner

## 2019-10-13 ENCOUNTER — Encounter (INDEPENDENT_AMBULATORY_CARE_PROVIDER_SITE_OTHER): Payer: Medicare Other

## 2019-10-13 ENCOUNTER — Other Ambulatory Visit: Payer: Self-pay

## 2019-10-13 ENCOUNTER — Encounter: Payer: Self-pay | Admitting: Nurse Practitioner

## 2019-10-13 VITALS — BP 110/60 | HR 78 | Ht 65.0 in | Wt 211.8 lb

## 2019-10-13 DIAGNOSIS — Z7901 Long term (current) use of anticoagulants: Secondary | ICD-10-CM | POA: Diagnosis not present

## 2019-10-13 DIAGNOSIS — I5189 Other ill-defined heart diseases: Secondary | ICD-10-CM | POA: Diagnosis not present

## 2019-10-13 DIAGNOSIS — I10 Essential (primary) hypertension: Secondary | ICD-10-CM

## 2019-10-13 DIAGNOSIS — I480 Paroxysmal atrial fibrillation: Secondary | ICD-10-CM

## 2019-10-13 NOTE — Telephone Encounter (Signed)
Application signed.  Thank you.

## 2019-10-13 NOTE — Patient Instructions (Addendum)
After Visit Summary:  We will be checking the following labs today - BMET   Medication Instructions:    Continue with your current medicines.   Ok to use the short acting Diltiazem if needed   If you need a refill on your cardiac medications before your next appointment, please call your pharmacy.     Testing/Procedures To Be Arranged:  30 day event monitor  Follow-Up:   See me back as planned next month    At [facility], you and your health needs are our priority.  As part of our continuing mission to provide you with exceptional heart care, we have created designated Provider Care Teams.  These Care Teams include your primary Cardiologist (physician) and Advanced Practice Providers (APPs -  Physician Assistants and Nurse Practitioners) who all work together to provide you with the care you need, when you need it.  Special Instructions:  . Stay safe, wash your hands for at least 20 seconds and wear a mask when needed.  . It was good to talk with you today.  [employer] Cardiac Event Monitor Instructions Your physician has requested you wear your cardiac event monitor for ___30__ days, (1-30). [employer] may call or text to confirm a shipping address. The monitor will be sent to a land address via UPS. [employer] will not ship a monitor to a PO BOX. It typically takes 3-5 days to receive your monitor after it has been enrolled. [employer] will assist with USPS tracking if your package is delayed. The telephone number for [employer] is [phone]. Once you have received your monitor, please review the enclosed instructions. Instruction tutorials can also be viewed under help and settings on the enclosed cell phone. Your monitor has already been registered assigning a specific monitor serial # to you.  Applying the monitor Remove cell phone from case and turn it on. The cell phone works as Dealer and needs to be within [employer] of you at all times. The cell  phone will need to be charged on a daily basis. We recommend you plug the cell phone into the enclosed charger at your bedside table every night.  Monitor batteries: You will receive two monitor batteries labelled #1 and #2. These are your recorders. Plug battery #2 onto the second connection on the enclosed charger. Keep one battery on the charger at all times. This will keep the monitor battery deactivated. It will also keep it fully charged for when you need to switch your monitor batteries. A small light will be blinking on the battery emblem when it is charging. The light on the battery emblem will remain on when the battery is fully charged.  Open package of a Monitor strip. Insert battery #1 into black hood on strip and gently squeeze monitor battery onto connection as indicated in instruction booklet. Set aside while preparing skin.  Choose location for your strip, vertical or horizontal, as indicated in the instruction booklet. Shave to remove all hair from location. There cannot be any lotions, oils, powders, or colognes on skin where monitor is to be applied. Wipe skin clean with enclosed Saline wipe. Dry skin completely.  Peel paper labeled #1 off the back of the Monitor strip exposing the adhesive. Place the monitor on the chest in the vertical or horizontal position shown in the instruction booklet. One arrow on the monitor strip must be pointing upward. Carefully remove paper labeled #2, attaching remainder of strip to your skin. Try not to create any folds or wrinkles  in the strip as you apply it.  Firmly press and release the circle in the center of the monitor battery. You will hear a small beep. This is turning the monitor battery on. The heart emblem on the monitor battery will light up every 5 seconds if the monitor battery in turned on and connected to the patient securely. Do not push and hold the circle down as this turns the monitor battery off. The cell phone will  locate the monitor battery. A screen will appear on the cell phone checking the connection of your monitor strip. This may read poor connection initially but change to good connection within the next minute. Once your monitor accepts the connection you will hear a series of 3 beeps followed by a climbing crescendo of beeps. A screen will appear on the cell phone showing the two monitor strip placement options. Touch the picture that demonstrates where you applied the monitor strip.  Your monitor strip and battery are waterproof. You are able to shower, bathe, or swim with the monitor on. They just ask you do not submerge deeper than 3 feet underwater. We recommend removing the monitor if you are swimming in a lake, river, or ocean.  Your monitor battery will need to be switched to a fully charged monitor battery approximately once a week. The cell phone will alert you of an action which needs to be made.  On the cell phone, tap for details to reveal connection status, monitor battery status, and cell phone battery status. The green dots indicates your monitor is in good status. A red dot indicates there is something that needs your attention.  To record a symptom, click the circle on the monitor battery. In 30-60 seconds a list of symptoms will appear on the cell phone. Select your symptom and tap save. Your monitor will record a sustained or significant arrhythmia regardless of you clicking the button. Some patients do not feel the heart rhythm irregularities. [employer] will notify us of any serious or critical events.  Refer to instruction booklet for instructions on switching batteries, changing strips, the Do not disturb or Pause features, or any additional questions.  Call [employer] at [phone], to confirm your monitor is transmitting and record your baseline. They will answer any questions you may have regarding the monitor instructions at that time.  Returning the monitor  to [employer] all equipment back into blue box. Peel off strip of paper to expose adhesive and close box securely. There is a prepaid UPS shipping label on this box. Drop in a UPS drop box, or at a UPS facility like [employer]. You may also contact [employer] to arrange UPS to pick up monitor package at your home.   Call the [place] office at [phone] if you have any questions, problems or concerns.

## 2019-10-13 NOTE — Telephone Encounter (Signed)
Application is in Dr. Lowella Bandy box for signature.

## 2019-10-14 LAB — BASIC METABOLIC PANEL
BUN/Creatinine Ratio: 20 (ref 12–28)
BUN: 24 mg/dL (ref 8–27)
CO2: 22 mmol/L (ref 20–29)
Calcium: 9.9 mg/dL (ref 8.7–10.3)
Chloride: 102 mmol/L (ref 96–106)
Creatinine, Ser: 1.22 mg/dL — ABNORMAL HIGH (ref 0.57–1.00)
GFR calc Af Amer: 49 mL/min/{1.73_m2} — ABNORMAL LOW (ref >59)
GFR calc non Af Amer: 42 mL/min/{1.73_m2} — ABNORMAL LOW (ref >59)
Glucose: 140 mg/dL — ABNORMAL HIGH (ref 65–99)
Potassium: 5.4 mmol/L — ABNORMAL HIGH (ref 3.5–5.2)
Sodium: 137 mmol/L (ref 134–144)

## 2019-10-20 ENCOUNTER — Telehealth: Payer: Self-pay

## 2019-10-20 NOTE — Telephone Encounter (Signed)
FAXED PATIENT ASSISTANCE APPLICATION FOR ELIQUIS TO [employer] TODAY.

## 2019-10-22 NOTE — Telephone Encounter (Signed)
Noted and agree. 

## 2019-10-22 NOTE — Telephone Encounter (Addendum)
Contacted patient and she has supply for only 4-5 more days.  I agreed to provide additional sample supply.   Medication Samples have been provided for the patient to pick up.  Drug name: Eliquis (apixaban)       Strength: 5mg         Qty: 56 tablets  LOT: [id number]  Exp.Date: 05/27/2019  Dosing instructions: 1 twice daily  The patient has been instructed regarding the correct time, dose, and frequency of taking this medication, including desired effects and most common side effects.   Janeann Forehand 10:59 AM 10/22/2019  Patient plans to pick up later today.  Medication placed at [facility] front desk.

## 2019-10-22 NOTE — Telephone Encounter (Signed)
Spoke to [employer] today regarding status of application for Eliquis patient assistance that we submitted.  [employer] is requesting the 1st page of 2020 tax return, due to the fact that they found the financial benefit letters for pt's social security and retirement benefits confusing.  I spoke to patient via phone and pt stated that she did not want to provide tax return due to the fact that she is no longer working.  I suggested that she could email me her bank statements with deposit amounts and we can submit those to see if it works in getting an approval.  Pt stated she would email me her bank statements.    Pt also states that she is finishing off her last week of sample of Eliquis and would like another sample if available/appropriate?

## 2019-10-27 ENCOUNTER — Other Ambulatory Visit: Payer: Self-pay | Admitting: Family Medicine

## 2019-10-27 ENCOUNTER — Other Ambulatory Visit: Payer: Self-pay

## 2019-10-27 ENCOUNTER — Ambulatory Visit
Admission: RE | Admit: 2019-10-27 | Discharge: 2019-10-27 | Disposition: A | Discharge status: Home / Self Care | Payer: Medicare Other | Source: Ambulatory Visit | Attending: Family Medicine | Admitting: Family Medicine

## 2019-10-27 ENCOUNTER — Ambulatory Visit: Payer: Medicare Other

## 2019-10-27 ENCOUNTER — Telehealth: Payer: Self-pay

## 2019-10-27 DIAGNOSIS — R921 Mammographic calcification found on diagnostic imaging of breast: Secondary | ICD-10-CM

## 2019-10-27 DIAGNOSIS — N644 Mastodynia: Secondary | ICD-10-CM

## 2019-10-27 NOTE — Telephone Encounter (Signed)
Pt's application for medication assistance for Eliquis was denied-pt exceeds income limit.  Pt is reluctant to provide 2020 tax return and without tax return patient would also be denied for Xarelto.  I spoke to patient on the phone today and emailed patient the contact information for [place] in case she had further questions regarding the Eliquis application.

## 2019-10-27 NOTE — Telephone Encounter (Signed)
Noted and agree. 

## 2019-11-03 ENCOUNTER — Other Ambulatory Visit: Payer: Self-pay | Admitting: Family Medicine

## 2019-11-03 DIAGNOSIS — D500 Iron deficiency anemia secondary to blood loss (chronic): Secondary | ICD-10-CM

## 2019-11-04 ENCOUNTER — Ambulatory Visit
Admission: RE | Admit: 2019-11-04 | Discharge: 2019-11-04 | Disposition: A | Discharge status: Home / Self Care | Payer: Medicare Other | Source: Ambulatory Visit | Attending: Family Medicine | Admitting: Family Medicine

## 2019-11-04 ENCOUNTER — Other Ambulatory Visit: Payer: Self-pay

## 2019-11-04 DIAGNOSIS — R921 Mammographic calcification found on diagnostic imaging of breast: Secondary | ICD-10-CM

## 2019-11-05 ENCOUNTER — Other Ambulatory Visit: Payer: Self-pay | Admitting: Podiatry

## 2019-11-13 LAB — HM DIABETES EYE EXAM

## 2019-11-16 ENCOUNTER — Ambulatory Visit: Payer: Medicare Other | Admitting: Family Medicine

## 2019-11-16 NOTE — Progress Notes (Signed)
CARDIOLOGY OFFICE NOTE  Date:  11/25/2019    Courtney Pena Date of Birth: Apr 27, 1940 Medical Record #[id number]  PCP:  [name], MD  Cardiologist:  Gillian Shields    Chief Complaint  Patient presents with  . Follow-up    Seen for Dr. Johnsie Cancel    History of Present Illness: Courtney Pena is a 79 y.o. female who presents today for a follow up visit. Seen for Dr. Johnsie Cancel.   She has a history of HTN, DM, HLD,obesityandOSA. Long history of PAF. OnXarelto -held in past for gastric ectasia that was ablated. She has aCHADS2VASCof at [facility]. Intolerant to flecainidedue tovisual changes.She has seen Dr. Rayann Heman in the past - declined ablation but noted sotalol could be an optionfor her. History of mild CAD noted by remote cath in 2004. Last Myoview dates back to 2020. She has continued to struggle with weight loss. Has tended to stop her iron.   Saw Dr. Johnsie Cancel in March. Felt to be doing ok. Had been placed on Ozempic and had lost weight.   I then saw her last week for a work in visit - several issues. Noted more spells of Af and using her prn CCB more. Seems to have some overall angst with getting older. Event monitor was obtained and this did not show AF.   Comes in today. Here alone. She is having some issues with gout in her fingers. She felts she may have had AF but this did not correlate with the monitor. Eliquis is pretty expensive - she has tried to get some assistance. Has not checked about formulary coverage. Breathing is good. No chest pain. Weight continues to go down.   Past Medical History:  Diagnosis Date  . Allergy   . Anemia due to GI blood loss 08/12/2011  . Arthritis   . Atrial flutter ([place]) 12-07-2010   converted in ED with 300 mg flecainide  . CAD (coronary artery disease)    a. mild per cath in 2004;  b. nonischemic Myoview in March 2012;  c. Lex MV 1/14:  EF 66%, no ischemia  . Cataract   . Chronic  anticoagulation - Xarelto started 07/07/2015, CHADS2CVASC=5 07/07/2015  . Clotting disorder ([place])   . Diastolic CHF ([place]) 9/67/5916  . External hemorrhoids 06/07/2010  . Gastric antral vascular ectasia    source for gi bleed in 07/2011 - Xarelto stopped  . Gastritis   . GERD (gastroesophageal reflux disease)   . Hiatal hernia   . Hyperlipidemia   . Hypertension   . Hypothyroidism   . Neuromuscular disorder ([place])   . Obesity   . Paroxysmal atrial fibrillation (H[facility])   . Personal history of colonic polyps 06/06/2009   cecal polyp  . Sleep apnea    does not use CPAP  . Type 2 diabetes mellitus with diabetic chronic kidney disease ([place]) 04/25/2006      . Ulcer     Past Surgical History:  Procedure Laterality Date  . BREAST BIOPSY Left   . BREAST EXCISIONAL BIOPSY Left    benign  . CARDIAC CATHETERIZATION  1999&2004  . CARPAL TUNNEL RELEASE Bilateral 2003  . CATARACT EXTRACTION Bilateral   . COLONOSCOPY    . ESOPHAGOGASTRODUODENOSCOPY  08/13/2011   Procedure: ESOPHAGOGASTRODUODENOSCOPY (EGD);  Surgeon: Gatha Mayer, MD;  Location: [facility] ENDOSCOPY;  Service: Endoscopy;  Laterality: N/A;  . POLYPECTOMY       Medications: Current Meds  Medication Sig  . acetaminophen (TYLENOL)  650 MG CR tablet Take 650 mg by mouth every 8 (eight) hours as needed for pain.  Marland Kitchen apixaban (ELIQUIS) 5 MG TABS tablet Take 1 tablet (5 mg total) by mouth 2 (two) times daily.  Marland Kitchen Apoaequorin (PREVAGEN PO) Take 1 tablet by mouth.  . blood glucose meter kit and supplies Dispense based on patient and insurance preference. Use up to four times daily as directed. (FOR ICD-10 E10.9, E11.9).  Marland Kitchen Cholecalciferol (VITAMIN D3) 2000 units TABS Take 2 capsules by mouth daily.   . Coenzyme Q10 (COQ10) 100 MG CAPS Take 100 mEq by mouth daily.   . colchicine 0.6 MG tablet TAKE 1 TABLET BY MOUTH EVERY DAY  . diclofenac sodium (VOLTAREN) 1 % GEL Apply 2 g topically 4 (four) times daily.  Marland Kitchen diltiazem (CARDIZEM CD) 360 MG 24 hr  capsule TAKE 1 CAPSULE BY MOUTH  DAILY  . diltiazem (CARDIZEM) 30 MG tablet Take 1 tablet (30 mg total) by mouth every 4 (four) hours as needed (elevated HR as long as BP > 100).  . ferrous sulfate 325 (65 FE) MG EC tablet Take 650 mg by mouth daily.   Marland Kitchen glucose blood (ONETOUCH ULTRA) test strip 1 Container by Other route every morning.  . hydrALAZINE (APRESOLINE) 25 MG tablet TAKE 1 TABLET BY MOUTH TWICE A DAY  . Insulin Pen Needle 31G X 8 MM MISC Use with pen to inject once daily.  . Lancets (ONETOUCH ULTRASOFT) lancets Use to check blood sugar daily.  Marland Kitchen levothyroxine (SYNTHROID) 112 MCG tablet TAKE 1 TABLET BY MOUTH  DAILY  . meclizine (ANTIVERT) 25 MG tablet TAKE 1 TABLET (25 MG TOTAL) BY MOUTH AS NEEDED.  . metFORMIN (GLUCOPHAGE-XR) 500 MG 24 hr tablet Take 4 tablets (2,000 mg total) by mouth daily.  . metoprolol succinate (TOPROL-XL) 100 MG 24 hr tablet TAKE 1 TABLET BY MOUTH 2  TIMES DAILY. TAKE WITH OR  IMMEDIATELY FOLLOWING A  MEAL.  . Multiple Vitamin (MULTI-VITAMINS) TABS Take daily by mouth.  . nitroGLYCERIN (NITROSTAT) 0.4 MG SL tablet Place 1 tablet (0.4 mg total) under the tongue every 5 (five) minutes as needed for chest pain (Call 911 if chest pain after three doses).  Marland Kitchen omeprazole (PRILOSEC) 40 MG capsule TAKE 1 CAPSULE BY MOUTH  DAILY  . ONE TOUCH ULTRA TEST test strip TEST 3 TIMES DAILY  . predniSONE 5 MG TBEC Take 5 mg by mouth daily. For 14 days  . rosuvastatin (CRESTOR) 10 MG tablet TAKE 1 TABLET (10 MG TOTAL) BY MOUTH 2 (TWO) TIMES A WEEK.  . Semaglutide, 1 MG/DOSE, 2 MG/1.5ML SOPN Inject 1 mg into the skin once a week.  . tobramycin (TOBREX) 0.3 % ophthalmic solution Place 1 drop into both eyes as directed.  . triamcinolone cream (KENALOG) 0.1 % Apply 1 application topically as needed (dermatitis).     Allergies: Allergies  Allergen Reactions  . Lisinopril Swelling    Swelling, may have had some breathing involvement.   . Nsaids Other (See Comments)    Patient  reports internal bleeding. Experienced with tolmetin.   Marland Kitchen Penicillins Shortness Of Breath and Swelling    Arm Swelling with Penicillin (Occurred in 1960s) Breathing - throat swelling with Amoxicillin (Occurred prior to 2002)  . Sulfamethoxazole Hives and Itching    "welps all over" immediately after dose  . Actos [Pioglitazone] Swelling    Swelling all over body  . Flecainide Other (See Comments)    Blurry  vision  . Gabapentin Other (See Comments)  Caused dysphoria   . Hctz [Hydrochlorothiazide] Other (See Comments)    Gout  . Iodinated Diagnostic Agents Itching    Pt. Developed mild itching after receiving IV cm; pt. Held; Dr. Keane Scrape recomended she take 50 mg of benadryl when she goes home-if necessary; Dr Mickey Farber recommends benadryl prior to future exams requiring contrast media, but stated other doctors may recommend another premedication prep.  Vania Rea [Empagliflozin] Other (See Comments)    Patient reported "it made her kidneys not work"  Patient resists retrial/alternative.  . Statins Other (See Comments)    Muscle aches with multiple statins. Able to tolerate rosuvastatin.  . Losartan Potassium Other (See Comments)    Lower extremity swelling  . Zetia [Ezetimibe] Other (See Comments)    Cramps    Social History: The patient  reports that she quit smoking about 26 years ago. Her smoking use included cigarettes. She has a 60.00 pack-year smoking history. She has never used smokeless tobacco. She reports that she does not drink alcohol and does not use drugs.   Family History: The patient's family history includes Colon polyps in her daughter; Diabetes in her daughter and maternal grandfather; Heart disease in her father; Hypertension in her mother; Other in her mother; Thyroid disease in her daughter.   Review of Systems: Please see the history of present illness.   All other systems are reviewed and negative.   Physical Exam: VS:  BP 122/70   Pulse 86   Ht 5'  5.5" (1.664 m)   Wt 208 lb 12.8 oz (94.7 kg)   SpO2 96%   BMI 34.22 kg/m  .  BMI Body mass index is 34.22 kg/m.  Wt Readings from Last 3 Encounters:  11/25/19 208 lb 12.8 oz (94.7 kg)  10/13/19 211 lb 12.8 oz (96.1 kg)  09/14/19 215 lb 9.6 oz (97.8 kg)    General: Pleasant. Alert and in no acute distress. Weight continues to decline.   Cardiac: Regular rate and rhythm. Very soft outflow murmur. No edema.  Respiratory:  Lungs are clear to auscultation bilaterally with normal work of breathing.  GI: Soft and nontender.  MS: No deformity or atrophy. Gait and ROM intact.  Skin: Warm and dry. Color is normal.  Neuro:  Strength and sensation are intact and no gross focal deficits noted.  Psych: Alert, appropriate and with normal affect.   LABORATORY DATA:  EKG:  EKG is not ordered today.    Lab Results  Component Value Date   WBC 6.4 09/14/2019   HGB 10.9 (L) 09/14/2019   HCT 34.5 09/14/2019   PLT 311 09/14/2019   GLUCOSE 140 (H) 10/13/2019   CHOL 129 05/25/2019   TRIG 128 05/25/2019   HDL 50 05/25/2019   LDLDIRECT 62 11/24/2018   LDLCALC 56 05/25/2019   ALT 12 11/28/2017   AST 10 11/28/2017   NA 137 10/13/2019   K 5.4 (H) 10/13/2019   CL 102 10/13/2019   CREATININE 1.22 (H) 10/13/2019   BUN 24 10/13/2019   CO2 22 10/13/2019   TSH 2.830 04/02/2019   INR 1.06 08/14/2011   HGBA1C 6.5 09/14/2019     BNP (last 3 results) No results for input(s): BNP in the last 8760 hours.  ProBNP (last 3 results) No results for input(s): PROBNP in the last 8760 hours.   Other Studies Reviewed Today:  Event Monitor Study Highlights 09/2019  NSR No significant arrhythmias  MyoviewStudy Highlights2/2020    Nuclear stress EF: 49%.  No T wave inversion  was noted during stress.  There was no ST segment deviation noted during stress.  The study is normal.  This is a low risk study.  Normal perfusion. LVEF 49% with very mild global hypokinesis. This is a low risk  study. Consider echo correlation of LVEF.    Lower Extremity Arterial DopplerSummary2/2020: Right: Resting right ankle-brachial index is within normal range. No evidence of significant right lower extremity arterial disease. The right toe-brachial index is normal.  Left: Resting left ankle-brachial index is within normal range. No evidence of significant left lower extremity arterial disease. The left toe-brachial index is mildly abnormal.   *See table(s) above for measurements and observations.  Electronically signed by Ena Dawley MD on 04/22/2018 at 1:54:12 PM.   EchoStudy Conclusions8/2019  - Left ventricle: The cavity size was mildly dilated. Wall thickness was normal. Systolic function was normal. The estimated ejection fraction was in the range of 55% to 60%. Wall motion was normal; there were no regional wall motion abnormalities. Doppler parameters are consistent with abnormal left ventricular relaxation (grade 1 diastolic dysfunction). Doppler parameters are consistent with high ventricular filling pressure. - Mitral valve: Calcified annulus. Mildly thickened leaflets .  Impressions:  - Normal LV systolic function; mild LVE; mild diastolic dysfunction.    Assessment/Plan:  1. Palpitations - her monitor looked good - she remains on her Eliquis as well. Has long and short acting CCB therapy.   2. Chronic anticoagulation - no problems  3. Prior mild hyperkalemia - will recheck today - she is trying to be more careful with her diet.   4. Chronic atypical chest pain - not really endorsed today. Myoview from 2020 was stable.   5. Chronic diastolic HF - BP is good - weight is down.  6. Obesity - continues to actively lose weight with Ozempic.   7. PAF - in sinus by exam. Stable monitor.   8. Chronic anticoagulation - no problems noted - she is going to check with her insurance company about formulary coverage.    Current  medicines are reviewed with the patient today.  The patient does not have concerns regarding medicines other than what has been noted above.  The following changes have been made:  See above.  Labs/ tests ordered today include:   No orders of the defined types were placed in this encounter.    Disposition:   FU with Dr. Johnsie Cancel in 6 months.    Patient is agreeable to this plan and will call if any problems develop in the interim.   SignedTruitt Merle, NP  11/25/2019 8:40 AM  [facility] [address] [place],   [address] Phone: [phone] Fax: [phone]

## 2019-11-25 ENCOUNTER — Encounter: Payer: Self-pay | Admitting: Nurse Practitioner

## 2019-11-25 ENCOUNTER — Ambulatory Visit: Payer: Medicare Other | Admitting: Nurse Practitioner

## 2019-11-25 ENCOUNTER — Other Ambulatory Visit: Payer: Self-pay

## 2019-11-25 VITALS — BP 122/70 | HR 86 | Ht 65.5 in | Wt 208.8 lb

## 2019-11-25 DIAGNOSIS — Z7901 Long term (current) use of anticoagulants: Secondary | ICD-10-CM | POA: Diagnosis not present

## 2019-11-25 DIAGNOSIS — I10 Essential (primary) hypertension: Secondary | ICD-10-CM | POA: Diagnosis not present

## 2019-11-25 DIAGNOSIS — I5189 Other ill-defined heart diseases: Secondary | ICD-10-CM

## 2019-11-25 DIAGNOSIS — I480 Paroxysmal atrial fibrillation: Secondary | ICD-10-CM | POA: Diagnosis not present

## 2019-11-25 LAB — BASIC METABOLIC PANEL
BUN/Creatinine Ratio: 23 (ref 12–28)
BUN: 26 mg/dL (ref 8–27)
CO2: 20 mmol/L (ref 20–29)
Calcium: 9.7 mg/dL (ref 8.7–10.3)
Chloride: 101 mmol/L (ref 96–106)
Creatinine, Ser: 1.12 mg/dL — ABNORMAL HIGH (ref 0.57–1.00)
GFR calc Af Amer: 54 mL/min/{1.73_m2} — ABNORMAL LOW (ref >59)
GFR calc non Af Amer: 47 mL/min/{1.73_m2} — ABNORMAL LOW (ref >59)
Glucose: 212 mg/dL — ABNORMAL HIGH (ref 65–99)
Potassium: 5.1 mmol/L (ref 3.5–5.2)
Sodium: 136 mmol/L (ref 134–144)

## 2019-11-25 NOTE — Patient Instructions (Addendum)
After Visit Summary:  We will be checking the following labs today - BMET   Medication Instructions:    Continue with your current medicines.    If you need a refill on your cardiac medications before your next appointment, please call your pharmacy.     Testing/Procedures To Be Arranged:  N/A  Follow-Up:   See Dr. Johnsie Cancel in 6 months.     At [facility], you and your health needs are our priority.  As part of our continuing mission to provide you with exceptional heart care, we have created designated Provider Care Teams.  These Care Teams include your primary Cardiologist (physician) and Advanced Practice Providers (APPs -  Physician Assistants and Nurse Practitioners) who all work together to provide you with the care you need, when you need it.  Special Instructions:  . Stay safe, wash your hands for at least 20 seconds and wear a mask when needed.  . It was good to talk with you today.  . Check with your insurance company about which blood thinner is on your current formulary.    Call the [place] office at [phone] if you have any questions, problems or concerns.

## 2019-11-26 ENCOUNTER — Ambulatory Visit (INDEPENDENT_AMBULATORY_CARE_PROVIDER_SITE_OTHER): Payer: Medicare Other | Admitting: Pharmacist

## 2019-11-26 ENCOUNTER — Ambulatory Visit (INDEPENDENT_AMBULATORY_CARE_PROVIDER_SITE_OTHER): Payer: Medicare Other | Admitting: Family Medicine

## 2019-11-26 ENCOUNTER — Other Ambulatory Visit: Payer: Self-pay

## 2019-11-26 ENCOUNTER — Encounter: Payer: Self-pay | Admitting: Family Medicine

## 2019-11-26 VITALS — BP 136/68 | HR 79 | Ht 65.0 in | Wt 209.2 lb

## 2019-11-26 DIAGNOSIS — E1122 Type 2 diabetes mellitus with diabetic chronic kidney disease: Secondary | ICD-10-CM | POA: Diagnosis not present

## 2019-11-26 DIAGNOSIS — Z23 Encounter for immunization: Secondary | ICD-10-CM | POA: Diagnosis not present

## 2019-11-26 DIAGNOSIS — E669 Obesity, unspecified: Secondary | ICD-10-CM | POA: Diagnosis not present

## 2019-11-26 DIAGNOSIS — I10 Essential (primary) hypertension: Secondary | ICD-10-CM

## 2019-11-26 DIAGNOSIS — M1A09X0 Idiopathic chronic gout, multiple sites, without tophus (tophi): Secondary | ICD-10-CM

## 2019-11-26 DIAGNOSIS — E1142 Type 2 diabetes mellitus with diabetic polyneuropathy: Secondary | ICD-10-CM

## 2019-11-26 MED ORDER — ALLOPURINOL 100 MG PO TABS: 100.0000 mg | ORAL_TABLET | Freq: Every day | ORAL | 6 refills | Status: DC

## 2019-11-26 NOTE — Patient Instructions (Signed)
It was nice to see you today!  Medication Changes:  Continue Ozempic 1 mg once weekly  Continue Metformin 1000 mg BID  Continue Glipizde 5 mg BID for the moment  Monitor blood sugars at home and keep a log (glucometer or piece of paper) to bring with you to your next visit.  Keep up the good work with diet and exercise. Aim for a diet full of vegetables, fruit and lean meats (chicken, [place], fish). Try to limit salt intake by eating fresh or frozen vegetables (instead of canned), rinse canned vegetables prior to cooking and do not add any additional salt to meals.    We will see you on October 21st @ 10:30AM

## 2019-11-26 NOTE — Assessment & Plan Note (Signed)
Diabetes longstanding currently controlled. Medication adherence appears optimal and patient self-restarted glipizide 5 mg BID due to hyperglycemia secondary to steroids. Denies hypoglycemia events. Instructed patient to continue glipizide for additional BG control and will reassess need after rheumatology appointment in October. Patient verbalized understanding.  -Continue her recently self-re-initiation of glipizide 5 mg BID with meals (continue only while on prednisone) -Continued Ozempic (semaglutide) 1 mg weekly -Continued Metformin XR 1000 mg BID -Extensively discussed pathophysiology of diabetes, recommended lifestyle interventions, dietary effects on blood sugar control -Counseled on s/sx of and management of hypoglycemia -Next A1C anticipated October 2021.

## 2019-11-26 NOTE — Patient Instructions (Addendum)
Terrific job with the weight loss.  That will pay huge dividends in the long run. Check your blood pressure at home.  As you lose weight, you may need less blood pressure meds.  Signs that I would need to cut back on your medication are lightheadedness on standing or the bottom number is 60 or below.  I want to add a gout preventive medication, allopurinol.  I sent in a prescription.   See me in 2-3 weeks.  There are several moving parts right now.  I want to make sure you are improving. You are due for the COVID booster any time.  I will give it next visit. Also, blood work next visit.

## 2019-11-26 NOTE — Progress Notes (Signed)
S:     Chief Complaint  Patient presents with  . Medication Management    DM f/u   Patient arrives in good spirits. Presents for diabetes evaluation, education, and management. Patient was referred and last seen by Primary Care Provider on 09/14/19.  Hand swelling and pain has been major health issue over the last several weeks (2 months).  Deferred to Dr. Hensel/ortho/rheum.  Patient is taking hydralazine.    Patient reports Diabetes was diagnosed in 2008.    Today, patient reports medication adherence with metformin 1000 mg BID and Ozempic 1 mg weekly. Reports self-restarting glizipide 5 mg BID ~4 weeks ago due to elevated sugars secondary to steroids. Reports severe swelling, warmness and pain in right hand joints and recently seen an orthopedic doctor who prescribed prednisone and colchicine. Reports upcoming appointment on 12/09/19 with rheumatology and is currently on Day 3 out of 14 of prednisone 5 mg daily. Reports mild improvement in symptoms and elevated sugars in the 200s during the day and 100s in the evenings - reason why she self-restarted glipizide. Reports prior to prednisone, sugars were wonderful.   Additionally, patient reports Ozempic patient assistance is approved and sending Ozempic to [facility], and has enough supply until end of October. However, patient is still gathering necessary paperwork needed for Eliquis patient assistance and has 1 tablet left.   Insurance coverage/medication affordability: [employer] Medicare  Medication adherence reported.   Current diabetes medications include: Ozempic 1 mg weekly, metformin XR 1000 mg BID , SHE restarted her GLIPIZIDE since her prednisone burst.   Current hypertension medications include: hydralazine, Toprol XL Current hyperlipidemia medications include: rosuvastatin   Patient denies hypoglycemic events.  O:  Physical Exam Vitals reviewed.  Constitutional:      Appearance: She is normal weight.  Musculoskeletal:     Right  hand: Swelling and tenderness present.     Left hand: Swelling and tenderness present.     Comments: Swelling more severe in right hand  Neurological:     Mental Status: She is alert.  Psychiatric:        Mood and Affect: Mood normal.        Behavior: Behavior normal.        Thought Content: Thought content normal.    Review of Systems  Musculoskeletal: Positive for joint pain (Both hands).  All other systems reviewed and are negative.   Lab Results  Component Value Date   HGBA1C 6.5 09/14/2019   There were no vitals filed for this visit.  Lipid Panel     Component Value Date/Time   CHOL 129 05/25/2019 0954   TRIG 128 05/25/2019 0954   HDL 50 05/25/2019 0954   CHOLHDL 2.6 05/25/2019 0954   CHOLHDL 4.7 01/27/2015 1238   VLDL 28 01/27/2015 1238   LDLCALC 56 05/25/2019 0954   LDLDIRECT 62 11/24/2018 1211   LDLDIRECT 178 (H) 05/12/2015 1229    Home fasting blood sugars: on first prednisone prescription highest 400, now on lower dose prednisone highest 270. This morning 255.  2 hour post-meal/random blood sugars: 90s at night before bed.  Clinical Atherosclerotic Cardiovascular Disease (ASCVD): Yes  The ASCVD Risk score Mikey Bussing DC Jr., et al., 2013) failed to calculate for the following reasons:   The valid total cholesterol range is 130 to 320 mg/dL    A/P: Diabetes longstanding currently controlled. Medication adherence appears optimal and patient self-restarted glipizide 5 mg BID due to hyperglycemia secondary to steroids. Denies hypoglycemia events. Instructed patient to continue  glipizide for additional BG control and will reassess need after rheumatology appointment in October. Patient verbalized understanding.  -Continue her recently self-re-initiation of glipizide 5 mg BID with meals (continue only while on prednisone) -Continued Ozempic (semaglutide) 1 mg weekly -Continued Metformin XR 1000 mg BID -Extensively discussed pathophysiology of diabetes, recommended  lifestyle interventions, dietary effects on blood sugar control -Counseled on s/sx of and management of hypoglycemia -Next A1C anticipated October 2021.   Chronic anticoagulation - 4 weeks of Eliquis (apixaban) provided Medication Samples have been provided to the patient.  Drug name: Apixaban  Strength: 5 mg       Qty: 56 tablets LOT: [id number]      Exp.Date: 05/26/2021 Dosing instructions: Take 1 tablet twice daily   The patient has been instructed regarding the correct time, dose, and frequency of taking this medication, including desired effects and most common side effects.   Written patient instructions provided.  Total time in face to face counseling 30 minutes.    Follow up Pharmacist Clinic Visit in 3 weeks.   Patient seen with Fara Olden, PharmD - PGY-1 Resident and Lorel Monaco, PharmD, PGY2 Pharmacy Resident.

## 2019-11-26 NOTE — Progress Notes (Signed)
Reviewed: I agree with Dr. Koval's documentation and management. 

## 2019-11-27 ENCOUNTER — Encounter: Payer: Self-pay | Admitting: Family Medicine

## 2019-11-27 NOTE — Assessment & Plan Note (Signed)
I amy be over treating BP with her excellent weight loss.  I had initially planned to keep her BP meds unchanged. However, when Dr. Valentina Lucks suggested the possibility of hydralazine induced lupus, I called patient, told to stop hydralazine and monitor BP

## 2019-11-27 NOTE — Assessment & Plan Note (Signed)
Diabetes control should be good except for steroids.  Try to minimize these.

## 2019-11-27 NOTE — Assessment & Plan Note (Signed)
She has dropped out of the morbid obesity catagory

## 2019-11-27 NOTE — Progress Notes (Signed)
    SUBJECTIVE:   CHIEF COMPLAINT / HPI:   FU hand pain and multiple problems Right hand pain.  Does not seem to respond to cochicine.  Has responded to steroids, which then drove up blood sugar.  Has been seen by ortho.  Gout is the working dx. DM.  Last A1C mid July good at 6.5.  BS up recently because of several steroid bursts with hand problem  GREAT weight loss.   Hypertension.  Nicely controled.  Rare lightheadedness when getting out of bed.  Otherwise asymptomatic.  BP has been consistently good.  She checks at home. Retinal problem - I am unclear.  Will get records.  She want good dM and BP control for this problem.    OBJECTIVE:   BP 136/68   Pulse 79   Ht 5\' 5"  (1.651 m)   Wt 209 lb 3.2 oz (94.9 kg)   SpO2 98%   BMI 34.81 kg/m   Wt and vS noted Lungs clear Cardiac RRR without m or g Ext.  Swelling of dorsum of right hand   ASSESSMENT/PLAN:   Obesity (BMI 30.0-34.9) She has dropped out of the morbid obesity catagory  Diabetic neuropathy ([place]) Diabetes control should be good except for steroids.  Try to minimize these.  Chronic gout of multiple sites Gout is still my leading clinical dx.  Began on allopurinol.  Patient aware that she might flair during first month of treatment. Another possibility of her hand pain in hydralazine induced lupus (Great thought, Dr. Valentina Lucks)  HYPERTENSION, BENIGN SYSTEMIC I amy be over treating BP with her excellent weight loss.  I had initially planned to keep her BP meds unchanged. However, when Dr. Valentina Lucks suggested the possibility of hydralazine induced lupus, I called patient, told to stop hydralazine and monitor BP     Courtney Resides, MD [place]

## 2019-11-27 NOTE — Assessment & Plan Note (Signed)
>>  ASSESSMENT AND PLAN FOR TYPE 2 DIABETES MELLITUS WITH DIABETIC NEUROPATHY, UNSPECIFIED ([facility]) WRITTEN ON 11/27/2019 10:09 AM BY HENSEL, Santiago Bumpers, MD  Diabetes control should be good except for steroids.  Try to minimize these.

## 2019-11-27 NOTE — Assessment & Plan Note (Signed)
Gout is still my leading clinical dx.  Began on allopurinol.  Patient aware that she might flair during first month of treatment. Another possibility of her hand pain in hydralazine induced lupus (Great thought, Dr. Valentina Lucks)

## 2019-12-09 DIAGNOSIS — M1A09X0 Idiopathic chronic gout, multiple sites, without tophus (tophi): Secondary | ICD-10-CM | POA: Insufficient documentation

## 2019-12-09 DIAGNOSIS — R768 Other specified abnormal immunological findings in serum: Secondary | ICD-10-CM | POA: Insufficient documentation

## 2019-12-14 ENCOUNTER — Other Ambulatory Visit: Payer: Self-pay | Admitting: *Deleted

## 2019-12-14 DIAGNOSIS — E1122 Type 2 diabetes mellitus with diabetic chronic kidney disease: Secondary | ICD-10-CM

## 2019-12-14 DIAGNOSIS — I480 Paroxysmal atrial fibrillation: Secondary | ICD-10-CM

## 2019-12-14 DIAGNOSIS — I10 Essential (primary) hypertension: Secondary | ICD-10-CM

## 2019-12-14 MED ORDER — METOPROLOL SUCCINATE ER 100 MG PO TB24: ORAL_TABLET | ORAL | 3 refills | Status: DC

## 2019-12-14 MED ORDER — GLUCOSE BLOOD VI STRP: ORAL_STRIP | 3 refills | Status: DC

## 2019-12-14 NOTE — Telephone Encounter (Signed)
Also need refill on One Touch Ultra test strip but received the below message when trying to reorder it. Brinda Focht Zimmerman Rumple, CMA   The following medication records are no longer available for ordering. Place a new order with a different medication record. ONE TOUCH ULTRA TEST test strip

## 2019-12-17 ENCOUNTER — Ambulatory Visit: Payer: Medicare Other | Admitting: Pharmacist

## 2019-12-18 ENCOUNTER — Emergency Department: Payer: Medicare Other

## 2019-12-18 ENCOUNTER — Observation Stay
Admission: EM | Admit: 2019-12-18 | Discharge: 2019-12-18 | Disposition: A | Discharge status: Home / Self Care | Payer: Medicare Other | Attending: Internal Medicine | Admitting: Internal Medicine

## 2019-12-18 ENCOUNTER — Observation Stay (HOSPITAL_BASED_OUTPATIENT_CLINIC_OR_DEPARTMENT_OTHER)
Admit: 2019-12-18 | Discharge: 2019-12-18 | Disposition: A | Discharge status: Home / Self Care | Payer: Medicare Other | Attending: Physician Assistant | Admitting: Physician Assistant

## 2019-12-18 DIAGNOSIS — I4892 Unspecified atrial flutter: Secondary | ICD-10-CM | POA: Diagnosis not present

## 2019-12-18 DIAGNOSIS — E7800 Pure hypercholesterolemia, unspecified: Secondary | ICD-10-CM | POA: Diagnosis not present

## 2019-12-18 DIAGNOSIS — E785 Hyperlipidemia, unspecified: Secondary | ICD-10-CM | POA: Insufficient documentation

## 2019-12-18 DIAGNOSIS — R0789 Other chest pain: Principal | ICD-10-CM | POA: Insufficient documentation

## 2019-12-18 DIAGNOSIS — N183 Chronic kidney disease, stage 3 unspecified: Secondary | ICD-10-CM | POA: Diagnosis present

## 2019-12-18 DIAGNOSIS — K2100 Gastro-esophageal reflux disease with esophagitis, without bleeding: Secondary | ICD-10-CM

## 2019-12-18 DIAGNOSIS — E1122 Type 2 diabetes mellitus with diabetic chronic kidney disease: Secondary | ICD-10-CM | POA: Diagnosis not present

## 2019-12-18 DIAGNOSIS — I2510 Atherosclerotic heart disease of native coronary artery without angina pectoris: Secondary | ICD-10-CM | POA: Diagnosis not present

## 2019-12-18 DIAGNOSIS — N1831 Chronic kidney disease, stage 3a: Secondary | ICD-10-CM | POA: Insufficient documentation

## 2019-12-18 DIAGNOSIS — I4891 Unspecified atrial fibrillation: Secondary | ICD-10-CM | POA: Diagnosis not present

## 2019-12-18 DIAGNOSIS — Z79899 Other long term (current) drug therapy: Secondary | ICD-10-CM | POA: Diagnosis not present

## 2019-12-18 DIAGNOSIS — Z8673 Personal history of transient ischemic attack (TIA), and cerebral infarction without residual deficits: Secondary | ICD-10-CM | POA: Diagnosis not present

## 2019-12-18 DIAGNOSIS — R1013 Epigastric pain: Secondary | ICD-10-CM | POA: Diagnosis not present

## 2019-12-18 DIAGNOSIS — I5030 Unspecified diastolic (congestive) heart failure: Secondary | ICD-10-CM

## 2019-12-18 DIAGNOSIS — Z86718 Personal history of other venous thrombosis and embolism: Secondary | ICD-10-CM | POA: Diagnosis not present

## 2019-12-18 DIAGNOSIS — E039 Hypothyroidism, unspecified: Secondary | ICD-10-CM | POA: Diagnosis not present

## 2019-12-18 DIAGNOSIS — I480 Paroxysmal atrial fibrillation: Secondary | ICD-10-CM

## 2019-12-18 DIAGNOSIS — D500 Iron deficiency anemia secondary to blood loss (chronic): Secondary | ICD-10-CM | POA: Diagnosis present

## 2019-12-18 DIAGNOSIS — I5032 Chronic diastolic (congestive) heart failure: Secondary | ICD-10-CM | POA: Insufficient documentation

## 2019-12-18 DIAGNOSIS — I313 Pericardial effusion (noninflammatory): Secondary | ICD-10-CM

## 2019-12-18 DIAGNOSIS — I130 Hypertensive heart and chronic kidney disease with heart failure and stage 1 through stage 4 chronic kidney disease, or unspecified chronic kidney disease: Secondary | ICD-10-CM | POA: Diagnosis not present

## 2019-12-18 DIAGNOSIS — I10 Essential (primary) hypertension: Secondary | ICD-10-CM | POA: Diagnosis present

## 2019-12-18 DIAGNOSIS — E1169 Type 2 diabetes mellitus with other specified complication: Secondary | ICD-10-CM | POA: Diagnosis not present

## 2019-12-18 DIAGNOSIS — Z7984 Long term (current) use of oral hypoglycemic drugs: Secondary | ICD-10-CM | POA: Insufficient documentation

## 2019-12-18 DIAGNOSIS — Z794 Long term (current) use of insulin: Secondary | ICD-10-CM | POA: Diagnosis not present

## 2019-12-18 DIAGNOSIS — D6489 Other specified anemias: Secondary | ICD-10-CM

## 2019-12-18 DIAGNOSIS — M1A09X0 Idiopathic chronic gout, multiple sites, without tophus (tophi): Secondary | ICD-10-CM | POA: Diagnosis present

## 2019-12-18 DIAGNOSIS — R079 Chest pain, unspecified: Secondary | ICD-10-CM | POA: Diagnosis not present

## 2019-12-18 DIAGNOSIS — Z20822 Contact with and (suspected) exposure to covid-19: Secondary | ICD-10-CM | POA: Insufficient documentation

## 2019-12-18 DIAGNOSIS — Z7901 Long term (current) use of anticoagulants: Secondary | ICD-10-CM | POA: Diagnosis not present

## 2019-12-18 DIAGNOSIS — E1129 Type 2 diabetes mellitus with other diabetic kidney complication: Secondary | ICD-10-CM | POA: Diagnosis present

## 2019-12-18 LAB — COMPREHENSIVE METABOLIC PANEL
ALT: 14 U/L (ref 0–44)
AST: 21 U/L (ref 15–41)
Albumin: 3.8 g/dL (ref 3.5–5.0)
Alkaline Phosphatase: 65 U/L (ref 38–126)
Anion gap: 13 (ref 5–15)
BUN: 23 mg/dL (ref 8–23)
CO2: 21 mmol/L — ABNORMAL LOW (ref 22–32)
Calcium: 9.5 mg/dL (ref 8.9–10.3)
Chloride: 106 mmol/L (ref 98–111)
Creatinine, Ser: 1.04 mg/dL — ABNORMAL HIGH (ref 0.44–1.00)
GFR, Estimated: 55 mL/min — ABNORMAL LOW (ref >60)
Glucose, Bld: 164 mg/dL — ABNORMAL HIGH (ref 70–99)
Potassium: 4.3 mmol/L (ref 3.5–5.1)
Sodium: 140 mmol/L (ref 135–145)
Total Bilirubin: 0.6 mg/dL (ref 0.3–1.2)
Total Protein: 7.2 g/dL (ref 6.5–8.1)

## 2019-12-18 LAB — BRAIN NATRIURETIC PEPTIDE: B Natriuretic Peptide: 49.2 pg/mL (ref 0.0–100.0)

## 2019-12-18 LAB — CBC
HCT: 32.5 % — ABNORMAL LOW (ref 36.0–46.0)
Hemoglobin: 10.3 g/dL — ABNORMAL LOW (ref 12.0–15.0)
MCH: 27.6 pg (ref 26.0–34.0)
MCHC: 31.7 g/dL (ref 30.0–36.0)
MCV: 87.1 fL (ref 80.0–100.0)
Platelets: 223 10*3/uL (ref 150–400)
RBC: 3.73 MIL/uL — ABNORMAL LOW (ref 3.87–5.11)
RDW: 18.5 % — ABNORMAL HIGH (ref 11.5–15.5)
WBC: 8.7 10*3/uL (ref 4.0–10.5)
nRBC: 0 % (ref 0.0–0.2)

## 2019-12-18 LAB — ECHOCARDIOGRAM COMPLETE
AR max vel: 2.01 cm2
AV Area VTI: 1.92 cm2
AV Area mean vel: 1.97 cm2
AV Mean grad: 6 mmHg
AV Peak grad: 11 mmHg
Ao pk vel: 1.66 m/s
Area-P 1/2: 3.53 cm2
Height: 65 in
S' Lateral: 2.78 cm
Weight: 3280 oz

## 2019-12-18 LAB — RESPIRATORY PANEL BY RT PCR (FLU A&B, COVID)
Influenza A by PCR: NEGATIVE
Influenza B by PCR: NEGATIVE
SARS Coronavirus 2 by RT PCR: NEGATIVE

## 2019-12-18 LAB — TSH: TSH: 5.192 u[IU]/mL — ABNORMAL HIGH (ref 0.350–4.500)

## 2019-12-18 LAB — TROPONIN I (HIGH SENSITIVITY)
Troponin I (High Sensitivity): 11 ng/L (ref <18)
Troponin I (High Sensitivity): 16 ng/L (ref <18)
Troponin I (High Sensitivity): 23 ng/L — ABNORMAL HIGH (ref <18)
Troponin I (High Sensitivity): 18 ng/L — ABNORMAL HIGH (ref <18)

## 2019-12-18 LAB — GLUCOSE, CAPILLARY: Glucose-Capillary: 69 mg/dL — ABNORMAL LOW (ref 70–99)

## 2019-12-18 MED ORDER — METOPROLOL SUCCINATE ER 100 MG PO TB24: ORAL_TABLET | ORAL | 1 refills | Status: DC

## 2019-12-18 MED ORDER — DILTIAZEM HCL 25 MG/5ML IV SOLN
5.0000 mg | Freq: Once | INTRAVENOUS | Status: AC
  Administered 2019-12-18: 5 mg via INTRAVENOUS
  Filled 2019-12-18: qty 5

## 2019-12-18 MED ORDER — MORPHINE SULFATE (PF) 2 MG/ML IV SOLN: 0.5000 mg | INTRAVENOUS | Status: DC | PRN

## 2019-12-18 MED ORDER — ONDANSETRON HCL 4 MG/2ML IJ SOLN: 4.0000 mg | Freq: Three times a day (TID) | INTRAMUSCULAR | Status: DC | PRN

## 2019-12-18 MED ORDER — INSULIN ASPART 100 UNIT/ML ~~LOC~~ SOLN: 0.0000 [IU] | Freq: Every day | SUBCUTANEOUS | Status: DC

## 2019-12-18 MED ORDER — INSULIN ASPART 100 UNIT/ML ~~LOC~~ SOLN: 0.0000 [IU] | Freq: Three times a day (TID) | SUBCUTANEOUS | Status: DC

## 2019-12-18 MED ORDER — METOPROLOL SUCCINATE ER 50 MG PO TB24
100.0000 mg | ORAL_TABLET | Freq: Two times a day (BID) | ORAL | Status: DC
  Administered 2019-12-18: 100 mg via ORAL
  Filled 2019-12-18: qty 2

## 2019-12-18 MED ORDER — ACETAMINOPHEN 325 MG PO TABS: 650.0000 mg | ORAL_TABLET | Freq: Four times a day (QID) | ORAL | Status: DC | PRN

## 2019-12-18 MED ORDER — HYDRALAZINE HCL 20 MG/ML IJ SOLN: 5.0000 mg | INTRAMUSCULAR | Status: DC | PRN

## 2019-12-18 MED ORDER — NITROGLYCERIN 0.4 MG SL SUBL: 0.4000 mg | SUBLINGUAL_TABLET | SUBLINGUAL | Status: DC | PRN

## 2019-12-18 NOTE — Consult Note (Signed)
Cardiology Consultation:   Patient ID: Courtney Pena; [id number]; 1940-10-15   Admit date: 12/18/2019 Date of Consult: 12/18/2019  Primary Care Provider: [name], MD Primary Cardiologist: Johnsie Cancel Primary Electrophysiologist:  Allred   Patient Profile:   Courtney Pena is a 79 y.o. 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 Blaine Hamper.  History of Present Illness:   [name] underwent remote cath in 2004 which showed mild CAD.  Most recent echo from 09/2017 showed an EF of 55 to 60%, normal wall motion, grade 1 diastolic dysfunction, and no significant valvular abnormalities.  Last Myoview in 03/2018 showed no significant ischemia with an LVEF of 49% and was overall a low risk scan.  Has a long history of A. fib.  She has previously been on Xarelto though this was held for gastric ectasia that was subsequently ablated.  She was noted to be intolerant to flecainide secondary to visual field changes.  She has been evaluated by Dr. Rayann Heman in the past and declined ablation though noted sotalol could be an option for her.  More recently, she was evaluated by our office in [facility] the summer noting increased symptoms concerning for A. fib leading to increased as needed calcium channel blocker usage.  She subsequently underwent outpatient cardiac monitoring which showed a sinus rhythm with no evidence of A. fib.  She was last seen in our office in late 10/2019 and noted increasing issues with gout in her fingers and felt like she was having more episodes of A. fib that did not correspond with her outpatient cardiac monitoring.  No changes were made.  She was in her usual state of health until the evening of 12/17/2019 when she developed symptoms of palpitations, dizziness,  shortness of breath, and presyncope with associated epigastric discomfort radiating superiorly.  Symptoms were much more worse than her typical A. fib symptoms.  She ended up taking 2 as needed short acting diltiazem's within about a 90-minute time span with continued symptoms.  In the setting she contacted her son who came over and contacted EMS.  Upon their arrival she was noted to be in A. fib with RVR with note indicating ventricular rates in the 140s bpm.  Of note, patient has been without her Toprol-XL 100 mg twice daily for the past week.  Upon her arrival to [facility] she was noted to be hypertensive with a BP of 155/99 and a heart rate of 101 bpm.  EKG showed A. fib with RVR, 118 bpm, nonspecific inferior ST-T changes.  Labs demonstrated an initial high-sensitivity troponin of 11 with a delta of 16.  BNP 49.  Covid negative.  Potassium 4.3, BUN 23, serum creatinine 1.04, AST/ALT normal.  WBC 8.7, Hgb 10.3, PLT 223.  TSH elevated at 5.192.  Chest x-ray showed mild cardiomegaly with no acute process.  CT abdomen pelvis performed for indigestion showed no acute process with noted diverticulosis without evidence of diverticulitis, and a small pericardial effusion.  In the ED she received 5 mg of IV Cardizem injection x2.  Upon being placed on telemetry she was noted to have converted to sinus rhythm and has maintained sinus rhythm since.  No evidence of significant ventricular ectopy noted on telemetry.  Upon cardiology consult she feels like she is close to her baseline  though continues to feel somewhat weak and does not feel like her breathing is fully back to baseline.  She denies any recent falls or hematochezia.  She does report some dark stools though attributes this to iron therapy.   Past Medical History:  Diagnosis Date  . Allergy   . Anemia due to GI blood loss 08/12/2011  . Arthritis   . Atrial flutter ([place]) 12-07-2010   converted in ED with 300 mg flecainide  . CAD (coronary artery disease)     a. mild per cath in 2004;  b. nonischemic Myoview in March 2012;  c. Lex MV 1/14:  EF 66%, no ischemia  . Cataract   . Chronic anticoagulation - Xarelto started 07/07/2015, CHADS2CVASC=5 07/07/2015  . Clotting disorder ([place])   . Diastolic CHF ([place]) 7/82/9562  . External hemorrhoids 06/07/2010  . Gastric antral vascular ectasia    source for gi bleed in 07/2011 - Xarelto stopped  . Gastritis   . GERD (gastroesophageal reflux disease)   . Hiatal hernia   . Hyperlipidemia   . Hypertension   . Hypothyroidism   . Neuromuscular disorder ([place])   . Obesity   . Paroxysmal atrial fibrillation (H[facility])   . Personal history of colonic polyps 06/06/2009   cecal polyp  . Sleep apnea    does not use CPAP  . Type 2 diabetes mellitus with diabetic chronic kidney disease ([place]) 04/25/2006      . Ulcer     Past Surgical History:  Procedure Laterality Date  . BREAST BIOPSY Left   . BREAST EXCISIONAL BIOPSY Left    benign  . CARDIAC CATHETERIZATION  1999&2004  . CARPAL TUNNEL RELEASE Bilateral 2003  . CATARACT EXTRACTION Bilateral   . COLONOSCOPY    . ESOPHAGOGASTRODUODENOSCOPY  08/13/2011   Procedure: ESOPHAGOGASTRODUODENOSCOPY (EGD);  Surgeon: Gatha Mayer, MD;  Location: [facility] ENDOSCOPY;  Service: Endoscopy;  Laterality: N/A;  . POLYPECTOMY       Home Meds: Prior to Admission medications   Medication Sig Start Date End Date Taking? Authorizing Provider  acetaminophen (TYLENOL) 650 MG CR tablet Take 650 mg by mouth every 8 (eight) hours as needed for pain.    [provider]  allopurinol (ZYLOPRIM) 100 MG tablet Take 1 tablet (100 mg total) by mouth daily. 11/26/19   [name], MD  apixaban (ELIQUIS) 5 MG TABS tablet Take 1 tablet (5 mg total) by mouth 2 (two) times daily. 10/08/19   [name], MD  Apoaequorin (PREVAGEN PO) Take 1 tablet by mouth.    [provider]  blood glucose meter kit and supplies Dispense based on patient and insurance preference. Use up to  four times daily as directed. (FOR ICD-10 E10.9, E11.9). 09/14/19   [name], MD  Cholecalciferol (VITAMIN D3) 2000 units TABS Take 2 capsules by mouth daily.     [provider]  Coenzyme Q10 (COQ10) 100 MG CAPS Take 100 mEq by mouth daily.  06/28/05   [provider]  colchicine 0.6 MG tablet TAKE 1 TABLET BY MOUTH EVERY DAY 11/05/19   Hyatt, Max T, DPM  diclofenac sodium (VOLTAREN) 1 % GEL Apply 2 g topically 4 (four) times daily. 11/25/18   [name], MD  diltiazem (CARDIZEM CD) 360 MG 24 hr capsule TAKE 1 CAPSULE BY MOUTH  DAILY 05/04/19   [name], MD  diltiazem (CARDIZEM) 30 MG tablet Take 1 tablet (30 mg total) by mouth every 4 (four) hours as needed (elevated  HR as long as BP > 100). 10/12/19   Josue Hector, MD  ferrous sulfate 325 (65 FE) MG EC tablet Take 650 mg by mouth daily.     [provider]  glipiZIDE (GLUCOTROL XL) 10 MG 24 hr tablet Take 10 mg by mouth 2 (two) times daily. 12/10/19   [provider]  glucose blood (ONETOUCH ULTRA) test strip 1 Container by Other route every morning. 05/21/16   [provider]  glucose blood test strip Use to test blood sugar 3 times daily. 12/14/19   [name], MD  hydrALAZINE (APRESOLINE) 25 MG tablet TAKE 1 TABLET BY MOUTH TWICE A DAY 05/04/19   Burtis Junes, NP  hydroxychloroquine (PLAQUENIL) 200 MG tablet Take 200 mg by mouth 2 (two) times daily. 12/09/19   [provider]  Insulin Pen Needle 31G X 8 MM MISC Use with pen to inject once daily. 05/13/18   [name], MD  Lancets [facility] ULTRASOFT) lancets Use to check blood sugar daily. 02/13/17   [name], MD  levothyroxine (SYNTHROID) 112 MCG tablet TAKE 1 TABLET BY MOUTH  DAILY 05/11/19   [name], MD  metFORMIN (GLUCOPHAGE-XR) 500 MG 24 hr tablet Take 4 tablets (2,000 mg total) by mouth daily. 05/21/19   McDiarmid, Blane Ohara, MD  metoprolol succinate (TOPROL-XL) 100 MG 24 hr tablet TAKE 1  TABLET BY MOUTH 2  TIMES DAILY. TAKE WITH OR  IMMEDIATELY FOLLOWING A  MEAL. 12/14/19   [name], MD  Multiple Vitamin (MULTI-VITAMINS) TABS Take daily by mouth. 06/28/05   [provider]  nitroGLYCERIN (NITROSTAT) 0.4 MG SL tablet Place 1 tablet (0.4 mg total) under the tongue every 5 (five) minutes as needed for chest pain (Call 911 if chest pain after three doses). 09/14/19   [name], MD  omeprazole (PRILOSEC) 40 MG capsule TAKE 1 CAPSULE BY MOUTH  DAILY 11/03/19   McDiarmid, Blane Ohara, MD  predniSONE 5 MG TBEC Take 5 mg by mouth daily. For 14 days    [provider]  rosuvastatin (CRESTOR) 10 MG tablet TAKE 1 TABLET (10 MG TOTAL) BY MOUTH 2 (TWO) TIMES A WEEK. 05/27/19   Hensel, Jamal Collin, MD  Semaglutide, 1 MG/DOSE, 2 MG/1.5ML SOPN Inject 1 mg into the skin once a week. 05/25/19   [name], MD  tobramycin (TOBREX) 0.3 % ophthalmic solution Place 1 drop into both eyes as directed. 08/25/19   [provider]  triamcinolone cream (KENALOG) 0.1 % Apply 1 application topically as needed (dermatitis).    [provider]    Inpatient Medications: Scheduled Meds: . insulin aspart  0-5 Units Subcutaneous QHS  . insulin aspart  0-9 Units Subcutaneous TID WC  . metoprolol succinate  100 mg Oral BID   Continuous Infusions:  PRN Meds: acetaminophen, hydrALAZINE, morphine injection, nitroGLYCERIN, ondansetron (ZOFRAN) IV  Allergies:   Allergies  Allergen Reactions  . Lisinopril Swelling    Swelling, may have had some breathing involvement.   . Nsaids Other (See Comments)    Patient reports internal bleeding. Experienced with tolmetin.   Marland Kitchen Penicillins Shortness Of Breath and Swelling    Arm Swelling with Penicillin (Occurred in 1960s) Breathing - throat swelling with Amoxicillin (Occurred prior to 2002)  . Sulfamethoxazole Hives and Itching    "welps all over" immediately after dose  . Actos [Pioglitazone] Swelling    Swelling all over  body  . Flecainide Other (See Comments)  Blurry  vision  . Gabapentin Other (See Comments)    Caused dysphoria   . Hctz [Hydrochlorothiazide] Other (See Comments)    Gout  . Iodinated Diagnostic Agents Itching    Pt. Developed mild itching after receiving IV cm; pt. Held; Dr. Keane Scrape recomended she take 50 mg of benadryl when she goes home-if necessary; Dr Mickey Farber recommends benadryl prior to future exams requiring contrast media, but stated other doctors may recommend another premedication prep.  Vania Rea [Empagliflozin] Other (See Comments)    Patient reported "it made her kidneys not work"  Patient resists retrial/alternative.  . Statins Other (See Comments)    Muscle aches with multiple statins. Able to tolerate rosuvastatin.  . Losartan Potassium Other (See Comments)    Lower extremity swelling  . Zetia [Ezetimibe] Other (See Comments)    Cramps    Social History:   Social History   Socioeconomic History  . Marital status: Widowed    Spouse name: Iona Beard  . Number of children: 4  . Years of education: some colle  . Highest education level: Not on file  Occupational History  . Occupation: Management    Employer: UNEMPLOYED  Tobacco Use  . Smoking status: Former Smoker    Packs/day: 2.00    Years: 30.00    Pack years: 60.00    Types: Cigarettes    Quit date: 02/26/1993    Years since quitting: 26.8  . Smokeless tobacco: Never Used  Vaping Use  . Vaping Use: Never used  Substance and Sexual Activity  . Alcohol use: No    Alcohol/week: 0.0 standard drinks  . Drug use: No  . Sexual activity: Not Currently  Other Topics Concern  . Not on file  Social History Narrative  . Not on file   Social Determinants of Health   Financial Resource Strain:   . Difficulty of Paying Living Expenses: Not on file  Food Insecurity:   . Worried About Charity fundraiser in the Last Year: Not on file  . Ran Out of Food in the Last Year: Not on file  Transportation Needs:     . Lack of Transportation (Medical): Not on file  . Lack of Transportation (Non-Medical): Not on file  Physical Activity:   . Days of Exercise per Week: Not on file  . Minutes of Exercise per Session: Not on file  Stress:   . Feeling of Stress : Not on file  Social Connections:   . Frequency of Communication with Friends and Family: Not on file  . Frequency of Social Gatherings with Friends and Family: Not on file  . Attends Religious Services: Not on file  . Active Member of Clubs or Organizations: Not on file  . Attends Archivist Meetings: Not on file  . Marital Status: Not on file  Intimate Partner Violence:   . Fear of Current or Ex-Partner: Not on file  . Emotionally Abused: Not on file  . Physically Abused: Not on file  . Sexually Abused: Not on file     Family History:   Family History  Problem Relation Age of Onset  . Heart disease Father   . Diabetes Maternal Grandfather   . Hypertension Mother   . Other Mother        brain tumor-benign  . Diabetes Daughter        pre-diabeties  . Colon polyps Daughter   . Thyroid disease Daughter        x 2  . Colon  cancer Neg Hx   . Esophageal cancer Neg Hx   . Stomach cancer Neg Hx   . Rectal cancer Neg Hx     ROS:  Review of Systems  Constitutional: Positive for malaise/fatigue. Negative for chills, diaphoresis, fever and weight loss.  HENT: Negative for congestion.   Eyes: Negative for discharge and redness.  Respiratory: Positive for shortness of breath. Negative for cough, sputum production and wheezing.   Cardiovascular: Positive for chest pain and palpitations. Negative for orthopnea, claudication, leg swelling and PND.  Gastrointestinal: Positive for abdominal pain and nausea. Negative for blood in stool, constipation, diarrhea, heartburn, melena and vomiting.  Musculoskeletal: Negative for falls and myalgias.  Skin: Negative for rash.  Neurological: Positive for dizziness and weakness. Negative for  tingling, tremors, sensory change, speech change, focal weakness and loss of consciousness.  Endo/Heme/Allergies: Does not bruise/bleed easily.  Psychiatric/Behavioral: Negative for substance abuse. The patient is not nervous/anxious.   All other systems reviewed and are negative.     Physical Exam/Data:   Vitals:   12/18/19 0600 12/18/19 0605 12/18/19 0610 12/18/19 0828  BP: 133/76   137/77  Pulse: (!) 58 80 78 77  Resp: '16 14 17 16  ' Temp:      TempSrc:      SpO2: 98% 98% 96% 98%  Weight:      Height:       No intake or output data in the 24 hours ending 12/18/19 1041 Filed Weights   12/18/19 0314  Weight: 93 kg   Body mass index is 34.11 kg/m.   Physical Exam: General: Well developed, well nourished, in no acute distress. Head: Normocephalic, atraumatic, sclera non-icteric, no xanthomas, nares without discharge.  Neck: Negative for carotid bruits. JVD not elevated. Lungs: Clear bilaterally to auscultation without wheezes, rales, or rhonchi. Breathing is unlabored. Heart: RRR with S1 S2. No murmurs, rubs, or gallops appreciated. Abdomen: Soft, non-tender, non-distended with normoactive bowel sounds. No hepatomegaly. No rebound/guarding. No obvious abdominal masses. Msk:  Strength and tone appear normal for age. Extremities: No clubbing or cyanosis. No edema. Distal pedal pulses are 2+ and equal bilaterally. Neuro: Alert and oriented X 3. No facial asymmetry. No focal deficit. Moves all extremities spontaneously. Psych:  Responds to questions appropriately with a normal affect.   EKG:  The EKG was personally reviewed and demonstrates: A. fib with RVR, 118 bpm, nonspecific inferior ST-T changes Telemetry:  Telemetry was personally reviewed and demonstrates: SR with underlying artifact  Weights: Filed Weights   12/18/19 0314  Weight: 93 kg    Relevant CV Studies:  Outpatient cardiac monitoring 09/2019: NSR No significant arrhythmias __________  Lexiscan MPI  03/2018:  Nuclear stress EF: 49%.  No T wave inversion was noted during stress.  There was no ST segment deviation noted during stress.  The study is normal.  This is a low risk study.   Normal perfusion. LVEF 49% with very mild global hypokinesis. This is a low risk study. Consider echo correlation of LVEF. __________  2D echo 09/2017: - Left ventricle: The cavity size was mildly dilated. Wall  thickness was normal. Systolic function was normal. The estimated  ejection fraction was in the range of 55% to 60%. Wall motion was  normal; there were no regional wall motion abnormalities. Doppler  parameters are consistent with abnormal left ventricular  relaxation (grade 1 diastolic dysfunction). Doppler parameters  are consistent with high ventricular filling pressure.  - Mitral valve: Calcified annulus. Mildly thickened leaflets .  Impressions:   - Normal LV systolic function; mild LVE; mild diastolic  dysfunction.    Laboratory Data:  Chemistry Recent Labs  Lab 12/18/19 0325  NA 140  K 4.3  CL 106  CO2 21*  GLUCOSE 164*  BUN 23  CREATININE 1.04*  CALCIUM 9.5  GFRNONAA 55*  ANIONGAP 13    Recent Labs  Lab 12/18/19 0325  PROT 7.2  ALBUMIN 3.8  AST 21  ALT 14  ALKPHOS 65  BILITOT 0.6   Hematology Recent Labs  Lab 12/18/19 0325  WBC 8.7  RBC 3.73*  HGB 10.3*  HCT 32.5*  MCV 87.1  MCH 27.6  MCHC 31.7  RDW 18.5*  PLT 223   Cardiac EnzymesNo results for input(s): TROPONINI in the last 168 hours. No results for input(s): TROPIPOC in the last 168 hours.  BNP Recent Labs  Lab 12/18/19 0325  BNP 49.2    DDimer No results for input(s): DDIMER in the last 168 hours.  Radiology/Studies:  CT ABDOMEN PELVIS WO CONTRAST  Result Date: 12/18/2019 IMPRESSION: 1. No acute process demonstrated in the abdomen or pelvis. No evidence of bowel obstruction or inflammation. Diverticulosis of the sigmoid colon without evidence of diverticulitis. 2.  Cardiac enlargement with small pericardial effusion. 3. Multiple bilateral renal cysts, some demonstrating increased density consistent with hemorrhagic cysts. 4. Small periumbilical hernia containing fat. 5. Aortic atherosclerosis. Aortic Atherosclerosis (ICD10-I70.0). Electronically Signed   By: Lucienne Capers M.D.   On: 12/18/2019 06:47   DG Chest Port 1 View  Result Date: 12/18/2019 IMPRESSION: 1. No focal consolidation. 2. Mild cardiomegaly. Electronically Signed   By: Anner Crete M.D.   On: 12/18/2019 03:42    Assessment and Plan:   1. PAF with RVR: -Presented in A. fib with RVR with ventricular rates in the 1 teens bpm -At some point before being connected to telemetry she converted to sinus rhythm and has maintained sinus rhythm since -Review of telemetry shows sinus rhythm with artifact without evidence of significant ventricular ectopy -This most recent episode which was more symptomatic than her prior episodes could certainly have been in the setting of her missing her metoprolol for the past week -Recommend resuming PTA Toprol-XL 100 mg twice daily with continuation of PTA Cardizem CD -Recommend she follow-up with EP in the outpatient setting for consideration of A. fib ablation versus antiarrhythmic therapy -CHADS2VASc 6 (CHF, HTN, age x 2, DM, sex category) -Continue PTA Eliquis, she does not meet reduced dosing criteria -No symptoms concerning for bleeding  2. Pericardial effusion: -Small effusion noted on CT imaging -Check echo  3.  Atypical chest pain: -High-sensitivity troponin negative x2 -No indication for heparin drip at this time without dynamic elevation in troponin -Check echo -Recent Lexiscan MPI from 2020 showed no significant ischemia  4.  HFpEF: -She appears euvolemic and well compensated -Not requiring standing diuretic  5.  Anemia with history of gastric ectasia: -Hemoglobin low though stable at her approximate baseline -Monitor   For  questions or updates, please contact [place] Please consult [email or web address] for contact info under Cardiology/STEMI.   Signed, Christell Faith, PA-C [place] Pager: [phone] 12/18/2019, 10:41 AM

## 2019-12-18 NOTE — Progress Notes (Signed)
*  PRELIMINARY RESULTS* Echocardiogram 2D Echocardiogram has been performed.  Courtney Pena Manisha Cancel 12/18/2019, 12:16 PM

## 2019-12-18 NOTE — ED Triage Notes (Signed)
PT arrived with EMS. Endorsing Chest pain with indigestion like symptoms. Symptoms started yesterday 730pm. Pt arrived with EMS with 20 G in [place]

## 2019-12-18 NOTE — Discharge Summary (Signed)
Physician Discharge Summary  Courtney Pena CHE:[id number] DOB: [birth year] DOA: 12/18/2019  PCP: [name], MD  Admit date: 12/18/2019 Discharge date: 12/18/2019  Recommendations for Outpatient Follow-up:  1. Follow up with Dr. 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  Likely due to demand ischemia secondary to atrial fibrillation with RVR.  Cardiology, Dr. Saunders Revel is consulted.  2D echo was done, which showed EF of 65-70% with a small pericardial effusion. Patient's atrial fibrillation with RVR was treated with Cardizem, her rhythm was converted to sinus rhythm.  Heart rate improved to 70s.  Per cardiologist, patient can be discharged home and follow-up with her cardiologist Dr. Johnsie Cancel and the atrial fibrillation clinic.  Atrial fibrillation with RVR: Patient was treated with Cardizem by IV ED, she is converted to normal sinus rhythm.  Heart rate is 70s. -Continue Cardizem -Resume home metoprolol 100 mg twice daily per card -Continue  Eliquis  Hypothyroidism -Synthroid  HYPERCHOLESTEROLEMIA -Crestor  HYPERTENSION, BENIGN SYSTEMIC -IV hydralazine as needed -Cardizem, metoprolol  Reflux esophagitis -Protonix  Anemia due to GI blood loss: Hemoglobin stable 10.3 -Continue iron supplement  Hyperlipidemia associated with type 2 diabetes mellitus (HCC) -Crestor  Chronic gout of multiple sites -Colchicine  Chronic diastolic CHF (congestive heart failure) ([place]): 2D echo today showed EF 65-70%.  Patient does not have leg edema JVD.  CHF is compensated. -Watch volume status closely  History of CVA (cerebrovascular accident) -Patient is on Eliquis for A. Fib -Continue Crestor  Type II diabetes mellitus with renal manifestations [facility]): Patient is taking Metformin, glipizide, semaglutide at home.  A1c 8.8 recently, poorly controlled --Sliding scale insulin  CKD (chronic kidney disease), stage IIIa.  Stable -Follow-up with BMP    Discharge Instructions  Discharge Instructions    Call MD for:  difficulty breathing, headache or visual disturbances   Complete by: As directed    Call MD for:  severe uncontrolled pain   Complete by: As directed    Call MD for:  temperature >100.4   Complete by: As directed    Diet - low sodium heart healthy   Complete by: As directed    Increase activity slowly   Complete by: As directed      Allergies as of 12/18/2019      Reactions   Actos [pioglitazone] Swelling   Swelling all over body and moonface   Lisinopril Swelling   Swelling, may have had some breathing involvement. Angioedema   Nsaids Other (See Comments)   Patient reports internal bleeding. Experienced with tolmetin.    Penicillins Shortness Of Breath, Swelling   Arm Swelling with Penicillin (Occurred in 1960s) Breathing -  throat swelling with Amoxicillin (Occurred prior to 2002)   Sulfamethoxazole Hives, Itching   "welps all over" immediately after dose   Flecainide Other (See Comments)   Blurry  vision    Hctz [hydrochlorothiazide] Other (See Comments)   Gout   Iodinated Diagnostic Agents Itching   Pt. Developed mild itching after receiving IV cm; pt. Held; Dr. Keane Scrape recomended she take 50 mg of benadryl when she goes home-if necessary; Dr Mickey Farber recommends benadryl prior to future exams requiring contrast media, but stated other doctors may recommend another premedication prep.   Jardiance [empagliflozin] Other (See Comments)   Patient reported "it made her kidneys not work"  Patient resists retrial/alternative.   Statins Other (See Comments)   Muscle aches with multiple statins. Able to tolerate rosuvastatin.   Gabapentin Other (See Comments)   Caused dysphoria   Losartan Potassium Other (See Comments)   Lower extremity swelling   Zetia [ezetimibe] Other (See Comments)   Cramps      Medication List    STOP taking these medications   predniSONE 5 MG Tbec     TAKE these medications   acetaminophen 650 MG CR tablet Commonly known as: TYLENOL Take 650 mg by mouth every 8 (eight) hours as needed for pain.   allopurinol 100 MG tablet Commonly known as: ZYLOPRIM Take 1 tablet (100 mg total) by mouth daily.   apixaban 5 MG Tabs tablet Commonly known as: ELIQUIS Take 1 tablet (5 mg total) by mouth 2 (two) times daily.   blood glucose meter kit and supplies Dispense based on patient and insurance preference. Use up to four times daily as directed. (FOR ICD-10 E10.9, E11.9).   cetirizine 10 MG tablet Commonly known as: ZYRTEC Take 10 mg by mouth as needed for allergies. Takes 10 mg as needed for sinus trouble   colchicine 0.6 MG tablet TAKE 1 TABLET BY MOUTH EVERY DAY   CoQ10 100 MG Caps Take 100 mEq by mouth daily.   diclofenac sodium 1 % Gel Commonly known as: VOLTAREN Apply 2 g topically 4 (four) times daily.   diltiazem 30 MG tablet Commonly known as: Cardizem Take 1 tablet (30 mg total) by mouth every 4 (four) hours as needed (elevated HR as long as BP >  100).   diltiazem 360 MG 24 hr capsule Commonly known as: CARDIZEM CD TAKE 1 CAPSULE BY MOUTH  DAILY   ferrous sulfate 325 (65 FE) MG EC tablet Take 650 mg by mouth daily.   glipiZIDE 10 MG 24 hr tablet Commonly known as: GLUCOTROL XL Take 10 mg by mouth 2 (two) times daily.   hydrALAZINE 25 MG tablet Commonly known as: APRESOLINE TAKE 1 TABLET BY MOUTH TWICE A DAY   hydroxychloroquine 200 MG tablet Commonly known as: PLAQUENIL Take 200 mg by mouth 2 (two) times daily.   Insulin Pen Needle 31G X 8 MM Misc Use with pen to inject once daily.   levothyroxine 112 MCG tablet Commonly known as: SYNTHROID TAKE 1 TABLET BY MOUTH  DAILY   metFORMIN 500 MG 24 hr tablet Commonly known as: GLUCOPHAGE-XR Take 4 tablets (2,000 mg total) by mouth daily.   metoprolol succinate 100 MG 24 hr tablet Commonly known as: TOPROL-XL TAKE 1 TABLET BY MOUTH 2  TIMES DAILY. TAKE WITH OR  IMMEDIATELY FOLLOWING A  MEAL.   Multi-Vitamins Tabs Take daily by mouth.   nitroGLYCERIN 0.4 MG SL tablet Commonly known as: NITROSTAT Place 1 tablet (0.4 mg total) under the tongue every 5 (  five) minutes as needed for chest pain (Call 911 if chest pain after three doses).   omeprazole 40 MG capsule Commonly known as: PRILOSEC TAKE 1 CAPSULE BY MOUTH  DAILY   OneTouch Ultra test strip Generic drug: glucose blood 1 Container by Other route every morning.   glucose blood test strip Use to test blood sugar 3 times daily.   onetouch ultrasoft lancets Use to check blood sugar daily.   PREVAGEN PO Take 1 tablet by mouth.   rosuvastatin 10 MG tablet Commonly known as: CRESTOR TAKE 1 TABLET (10 MG TOTAL) BY MOUTH 2 (TWO) TIMES A WEEK.   Semaglutide (1 MG/DOSE) 2 MG/1.5ML Sopn Inject 1 mg into the skin once a week.   tobramycin 0.3 % ophthalmic solution Commonly known as: TOBREX Place 1 drop into both eyes as directed.   triamcinolone cream 0.1 % Commonly known as: KENALOG Apply 1 application  topically as needed (dermatitis).   Vitamin D3 50 MCG (2000 UT) Tabs Take 2 capsules by mouth daily.       Follow-up Information    Josue Hector, MD Follow up in 5 day(s).   Specialty: Cardiology Contact information: [address] [place] [address] [phone]              Allergies  Allergen Reactions  . Actos [Pioglitazone] Swelling    Swelling all over body and moonface  . Lisinopril Swelling    Swelling, may have had some breathing involvement. Angioedema  . Nsaids Other (See Comments)    Patient reports internal bleeding. Experienced with tolmetin.   Marland Kitchen Penicillins Shortness Of Breath and Swelling    Arm Swelling with Penicillin (Occurred in 1960s) Breathing - throat swelling with Amoxicillin (Occurred prior to 2002)  . Sulfamethoxazole Hives and Itching    "welps all over" immediately after dose  . Flecainide Other (See Comments)    Blurry  vision  . Hctz [Hydrochlorothiazide] Other (See Comments)    Gout  . Iodinated Diagnostic Agents Itching    Pt. Developed mild itching after receiving IV cm; pt. Held; Dr. Keane Scrape recomended she take 50 mg of benadryl when she goes home-if necessary; Dr Mickey Farber recommends benadryl prior to future exams requiring contrast media, but stated other doctors may recommend another premedication prep.  Vania Rea [Empagliflozin] Other (See Comments)    Patient reported "it made her kidneys not work"  Patient resists retrial/alternative.  . Statins Other (See Comments)    Muscle aches with multiple statins. Able to tolerate rosuvastatin.  . Gabapentin Other (See Comments)    Caused dysphoria   . Losartan Potassium Other (See Comments)    Lower extremity swelling  . Zetia [Ezetimibe] Other (See Comments)    Cramps    Consultations:  Dr. Saunders Revel and Ran Dunn of card   Procedures/Studies: CT ABDOMEN PELVIS WO CONTRAST  Result Date: 12/18/2019 CLINICAL DATA:  Upper abdominal pain radiating to the  lower abdomen. Nausea and intermittent diarrhea. Symptoms started 2 weeks ago. EXAM: CT ABDOMEN AND PELVIS WITHOUT CONTRAST TECHNIQUE: Multidetector CT imaging of the abdomen and pelvis was performed following the standard protocol without IV contrast. COMPARISON:  03/22/2017 FINDINGS: Lower chest: Lung bases are clear. Cardiac enlargement with small pericardial effusion. Coronary artery calcifications. Hepatobiliary: No focal liver abnormality is seen. No gallstones, gallbladder wall thickening, or biliary dilatation. Pancreas: Unremarkable. No pancreatic ductal dilatation or surrounding inflammatory changes. Spleen: Normal in size without focal abnormality. Adrenals/Urinary Tract: No adrenal gland nodules. Multiple subcentimeter cysts on  both kidneys, some demonstrating increased density consistent with hemorrhagic cysts. No hydronephrosis or hydroureter. No renal or ureteral stones. Bladder is normal. Stomach/Bowel: Stomach is within normal limits. Appendix appears normal. No evidence of bowel wall thickening, distention, or inflammatory changes. Diverticulosis of the sigmoid colon. No evidence of diverticulitis. Vascular/Lymphatic: Aortic atherosclerosis. No enlarged abdominal or pelvic lymph nodes. Reproductive: Uterus and bilateral adnexa are unremarkable. Other: Small periumbilical hernia containing fat. No free air or free fluid in the abdomen. Musculoskeletal: Degenerative changes in the spine. No destructive bone lesions. IMPRESSION: 1. No acute process demonstrated in the abdomen or pelvis. No evidence of bowel obstruction or inflammation. Diverticulosis of the sigmoid colon without evidence of diverticulitis. 2. Cardiac enlargement with small pericardial effusion. 3. Multiple bilateral renal cysts, some demonstrating increased density consistent with hemorrhagic cysts. 4. Small periumbilical hernia containing fat. 5. Aortic atherosclerosis. Aortic Atherosclerosis (ICD10-I70.0). Electronically Signed    By: Lucienne Capers M.D.   On: 12/18/2019 06:47   DG Chest Port 1 View  Result Date: 12/18/2019 CLINICAL DATA:  78 year old female with palpitation. EXAM: PORTABLE CHEST 1 VIEW COMPARISON:  Chest radiograph dated 07/28/2017 FINDINGS: Mild chronic interstitial coarsening and bronchitic changes. Faint mid to lower lung field interstitial prominence may represent atelectasis or mild congestion. Clinical correlation is recommended. No focal consolidation, pleural effusion, pneumothorax. There is mild cardiomegaly. Atherosclerotic calcification of the aorta. No acute osseous pathology. IMPRESSION: 1. No focal consolidation. 2. Mild cardiomegaly. Electronically Signed   By: Anner Crete M.D.   On: 12/18/2019 03:42   ECHOCARDIOGRAM COMPLETE  Result Date: 12/18/2019    ECHOCARDIOGRAM REPORT   Patient Name:   Courtney Pena Date of Exam: 12/18/2019 Medical Rec #:  [id number]                      Height:       65.0 in Accession #:    [id number]                     Weight:       205.0 lb Date of Birth:  07-23-1940                      BSA:          1.999 m Patient Age:    51 years                       BP:           137/77 mmHg Patient Gender: F                              HR:           76 bpm. Exam Location:  [facility] Procedure: 2D Echo, Color Doppler and Cardiac Doppler Indications:     I48.91 Atrial fibrillation; I31.3 Pericardial effusion  History:         Patient has prior history of Echocardiogram examinations. CHF,                  CAD, Arrythmias:P Afib; Risk Factors:Diabetes, Hypertension and                  Dyslipidemia.  Sonographer:     Charmayne Sheer RDCS (AE) Referring Phys:  [id number] Rise Mu Diagnosing Phys: Nelva Bush MD  Sonographer Comments: No subcostal window. IMPRESSIONS  1. Left ventricular  ejection fraction, by estimation, is 65 to 70%. The left ventricle has normal function. The left ventricle has no regional wall motion abnormalities. There is mild left ventricular  hypertrophy. Left ventricular diastolic parameters are indeterminate. Elevated left atrial pressure.  2. Right ventricular systolic function is normal. The right ventricular size is normal.  3. A small pericardial effusion is present. There is no evidence of cardiac tamponade.  4. The mitral valve is degenerative. Trivial mitral valve regurgitation. No evidence of mitral stenosis.  5. The aortic valve has an indeterminant number of cusps. Aortic valve regurgitation is not visualized. No aortic stenosis is present.  6. The inferior vena cava is normal in size with greater than 50% respiratory variability, suggesting right atrial pressure of 3 mmHg. FINDINGS  Left Ventricle: Left ventricular ejection fraction, by estimation, is 65 to 70%. The left ventricle has normal function. The left ventricle has no regional wall motion abnormalities. The left ventricular internal cavity size was normal in size. There is  mild left ventricular hypertrophy. Left ventricular diastolic parameters are indeterminate. Elevated left atrial pressure. Right Ventricle: The right ventricular size is normal. No increase in right ventricular wall thickness. Right ventricular systolic function is normal. Left Atrium: Left atrial size was normal in size. Right Atrium: Right atrial size was normal in size. Pericardium: A small pericardial effusion is present. There is no evidence of cardiac tamponade. Mitral Valve: The mitral valve is degenerative in appearance. There is mild thickening of the mitral valve leaflet(s). There is mild calcification of the mitral valve leaflet(s). Mild mitral annular calcification. Trivial mitral valve regurgitation. No evidence of mitral valve stenosis. MV peak gradient, 4.7 mmHg. The mean mitral valve gradient is 2.0 mmHg. Tricuspid Valve: The tricuspid valve is grossly normal. Tricuspid valve regurgitation is trivial. Aortic Valve: The aortic valve has an indeterminant number of cusps. Aortic valve regurgitation  is not visualized. No aortic stenosis is present. Aortic valve mean gradient measures 6.0 mmHg. Aortic valve peak gradient measures 11.0 mmHg. Aortic valve area, by VTI measures 1.92 cm. Pulmonic Valve: The pulmonic valve was not well visualized. Pulmonic valve regurgitation is not visualized. No evidence of pulmonic stenosis. Aorta: The aortic root is normal in size and structure. Pulmonary Artery: The pulmonary artery is not well seen. Venous: The inferior vena cava is normal in size with greater than 50% respiratory variability, suggesting right atrial pressure of 3 mmHg. IAS/Shunts: The interatrial septum was not well visualized.  LEFT VENTRICLE PLAX 2D LVIDd:         4.84 cm  Diastology LVIDs:         2.78 cm  LV e' medial:    3.70 cm/s LV PW:         1.37 cm  LV E/e' medial:  21.6 LV IVS:        1.23 cm  LV e' lateral:   4.24 cm/s LVOT diam:     2.00 cm  LV E/e' lateral: 18.9 LV SV:         58 LV SV Index:   29 LVOT Area:     3.14 cm  RIGHT VENTRICLE RV Basal diam:  3.86 cm LEFT ATRIUM             Index       RIGHT ATRIUM           Index LA diam:        4.20 cm 2.10 cm/m  RA Area:     12.85 cm 6.43 cm/m  LA Vol (A2C):   55.2 ml 27.61 ml/m LA Vol (A4C):   45.6 ml 22.81 ml/m LA Biplane Vol: 52.8 ml 26.41 ml/m  AORTIC VALVE                    PULMONIC VALVE AV Area (Vmax):    2.01 cm     PV Vmax:       1.03 m/s AV Area (Vmean):   1.97 cm     PV Vmean:      61.500 cm/s AV Area (VTI):     1.92 cm     PV VTI:        0.161 m AV Vmax:           166.00 cm/s  PV Peak grad:  4.2 mmHg AV Vmean:          115.000 cm/s PV Mean grad:  2.0 mmHg AV VTI:            0.301 m AV Peak Grad:      11.0 mmHg AV Mean Grad:      6.0 mmHg LVOT Vmax:         106.00 cm/s LVOT Vmean:        72.200 cm/s LVOT VTI:          0.184 m LVOT/AV VTI ratio: 0.61  AORTA Ao Root diam: 3.20 cm MITRAL VALVE MV Area (PHT): 3.53 cm    SHUNTS MV Peak grad:  4.7 mmHg    Systemic VTI:  0.18 m MV Mean grad:  2.0 mmHg    Systemic Diam: 2.00 cm MV  Vmax:       1.08 m/s MV Vmean:      70.2 cm/s MV Decel Time: 215 msec MV E velocity: 80.10 cm/s MV A velocity: 94.70 cm/s MV E/A ratio:  0.85 Harrell Gave End MD Electronically signed by Nelva Bush MD Signature Date/Time: 12/18/2019/2:32:41 PM    Final        Discharge Exam: Vitals:   12/18/19 1200 12/18/19 1415  BP:    Pulse: 72 70  Resp: 17 (!) 22  Temp:    SpO2: 95% 98%   Vitals:   12/18/19 0610 12/18/19 0828 12/18/19 1200 12/18/19 1415  BP:  137/77    Pulse: 78 77 72 70  Resp: _0 (!) 22  Temp:      TempSrc:      SpO2: 96% 98% 95% 98%  Weight:      Height:        General: Not in acute distress HEENT:       Eyes: PERRL, EOMI, no scleral icterus.       ENT: No discharge from the ears and nose, no pharynx injection, no tonsillar enlargement.        Neck: No JVD, no bruit, no mass felt. Heme: No neck lymph node enlargement. Cardiac: S1/S2, RRR, No murmurs, No gallops or rubs. Respiratory: No rales, wheezing, rhonchi or rubs. GI: Soft, nondistended, nontender, no rebound pain, no organomegaly, BS present. GU: No hematuria Ext: No pitting leg edema bilaterally. 2+DP/PT pulse bilaterally. Musculoskeletal: No joint deformities, No joint redness or warmth, no limitation of ROM in spin. Skin: No rashes.  Neuro: Alert, oriented X3, cranial nerves II-XII grossly intact, moves all extremities normally.       The results of significant diagnostics from this hospitalization (including imaging, microbiology, ancillary and laboratory) are listed below for reference.     Microbiology: Recent Results (from the past 240  hour(s))  Respiratory Panel by RT PCR (Flu A&B, Covid) - Nasopharyngeal Swab     Status: None   Collection Time: 12/18/19  8:17 AM   Specimen: Nasopharyngeal Swab  Result Value Ref Range Status   SARS Coronavirus 2 by RT PCR NEGATIVE NEGATIVE Final    Comment: (NOTE) SARS-CoV-2 target nucleic acids are NOT DETECTED.  The SARS-CoV-2 RNA is generally  detectable in upper respiratoy specimens during the acute phase of infection. The lowest concentration of SARS-CoV-2 viral copies this assay can detect is 131 copies/mL. A negative result does not preclude SARS-Cov-2 infection and should not be used as the sole basis for treatment or other patient management decisions. A negative result may occur with  improper specimen collection/handling, submission of specimen other than nasopharyngeal swab, presence of viral mutation(s) within the areas targeted by this assay, and inadequate number of viral copies (<131 copies/mL). A negative result must be combined with clinical observations, patient history, and epidemiological information. The expected result is Negative.  Fact Sheet for Patients:  PinkCheek.be  Fact Sheet for Healthcare Providers:  GravelBags.it  This test is no t yet approved or cleared by the [place] FDA and  has been authorized for detection and/or diagnosis of SARS-CoV-2 by FDA under an Emergency Use Authorization (EUA). This EUA will remain  in effect (meaning this test can be used) for the duration of the COVID-19 declaration under Section 564(b)(1) of the Act, 21 U.S.C. section 360bbb-3(b)(1), unless the authorization is terminated or revoked sooner.     Influenza A by PCR NEGATIVE NEGATIVE Final   Influenza B by PCR NEGATIVE NEGATIVE Final    Comment: (NOTE) The Xpert Xpress SARS-CoV-2/FLU/RSV assay is intended as an aid in  the diagnosis of influenza from Nasopharyngeal swab specimens and  should not be used as a sole basis for treatment. Nasal washings and  aspirates are unacceptable for Xpert Xpress SARS-CoV-2/FLU/RSV  testing.  Fact Sheet for Patients: PinkCheek.be  Fact Sheet for Healthcare Providers: GravelBags.it  This test is not yet approved or cleared by the [place] FDA and   has been authorized for detection and/or diagnosis of SARS-CoV-2 by  FDA under an Emergency Use Authorization (EUA). This EUA will remain  in effect (meaning this test can be used) for the duration of the  Covid-19 declaration under Section 564(b)(1) of the Act, 21  U.S.C. section 360bbb-3(b)(1), unless the authorization is  terminated or revoked. Performed at [facility], [address] [place],  [address]      Labs: BNP (last 3 results) Recent Labs    12/18/19 0325  BNP 54.2   Basic Metabolic Panel: Recent Labs  Lab 12/18/19 0325  NA 140  K 4.3  CL 106  CO2 21*  GLUCOSE 164*  BUN 23  CREATININE 1.04*  CALCIUM 9.5   Liver Function Tests: Recent Labs  Lab 12/18/19 0325  AST 21  ALT 14  ALKPHOS 65  BILITOT 0.6  PROT 7.2  ALBUMIN 3.8   No results for input(s): LIPASE, AMYLASE in the last 168 hours. No results for input(s): AMMONIA in the last 168 hours. CBC: Recent Labs  Lab 12/18/19 0325  WBC 8.7  HGB 10.3*  HCT 32.5*  MCV 87.1  PLT 223   Cardiac Enzymes: No results for input(s): CKTOTAL, CKMB, CKMBINDEX, TROPONINI in the last 168 hours. BNP: Invalid input(s): POCBNP CBG: Recent Labs  Lab 12/18/19 1133  GLUCAP 69*   D-Dimer No results for input(s): DDIMER in the last  72 hours. Hgb A1c No results for input(s): HGBA1C in the last 72 hours. Lipid Profile No results for input(s): CHOL, HDL, LDLCALC, TRIG, CHOLHDL, LDLDIRECT in the last 72 hours. Thyroid function studies Recent Labs    12/18/19 0537  TSH 5.192*   Anemia work up No results for input(s): VITAMINB12, FOLATE, FERRITIN, TIBC, IRON, RETICCTPCT in the last 72 hours. Urinalysis    Component Value Date/Time   COLORURINE YELLOW 08/12/2011 0215   APPEARANCEUR CLOUDY (A) 08/12/2011 0215   LABSPEC 1.020 08/12/2011 0215   PHURINE 5.0 08/12/2011 0215   GLUCOSEU NEGATIVE 08/12/2011 0215   HGBUR NEGATIVE 08/12/2011 0215   HGBUR negative 10/01/2008 1033    BILIRUBINUR small (A) 03/13/2017 1508   BILIRUBINUR NEG 11/09/2015 1036   KETONESUR small (15) (A) 03/13/2017 1508   KETONESUR NEGATIVE 08/12/2011 0215   PROTEINUR =100 (A) 03/13/2017 1508   PROTEINUR 100 11/09/2015 1036   PROTEINUR NEGATIVE 08/12/2011 0215   UROBILINOGEN 0.2 03/13/2017 1508   UROBILINOGEN 0.2 08/12/2011 0215   NITRITE Negative 03/13/2017 1508   NITRITE NEG 11/09/2015 1036   NITRITE NEGATIVE 08/12/2011 0215   LEUKOCYTESUR Trace (A) 03/13/2017 1508   Sepsis Labs Invalid input(s): PROCALCITONIN,  WBC,  LACTICIDVEN Microbiology Recent Results (from the past 240 hour(s))  Respiratory Panel by RT PCR (Flu A&B, Covid) - Nasopharyngeal Swab     Status: None   Collection Time: 12/18/19  8:17 AM   Specimen: Nasopharyngeal Swab  Result Value Ref Range Status   SARS Coronavirus 2 by RT PCR NEGATIVE NEGATIVE Final    Comment: (NOTE) SARS-CoV-2 target nucleic acids are NOT DETECTED.  The SARS-CoV-2 RNA is generally detectable in upper respiratoy specimens during the acute phase of infection. The lowest concentration of SARS-CoV-2 viral copies this assay can detect is 131 copies/mL. A negative result does not preclude SARS-Cov-2 infection and should not be used as the sole basis for treatment or other patient management decisions. A negative result may occur with  improper specimen collection/handling, submission of specimen other than nasopharyngeal swab, presence of viral mutation(s) within the areas targeted by this assay, and inadequate number of viral copies (<131 copies/mL). A negative result must be combined with clinical observations, patient history, and epidemiological information. The expected result is Negative.  Fact Sheet for Patients:  PinkCheek.be  Fact Sheet for Healthcare Providers:  GravelBags.it  This test is no t yet approved or cleared by the [place] FDA and  has been authorized for  detection and/or diagnosis of SARS-CoV-2 by FDA under an Emergency Use Authorization (EUA). This EUA will remain  in effect (meaning this test can be used) for the duration of the COVID-19 declaration under Section 564(b)(1) of the Act, 21 U.S.C. section 360bbb-3(b)(1), unless the authorization is terminated or revoked sooner.     Influenza A by PCR NEGATIVE NEGATIVE Final   Influenza B by PCR NEGATIVE NEGATIVE Final    Comment: (NOTE) The Xpert Xpress SARS-CoV-2/FLU/RSV assay is intended as an aid in  the diagnosis of influenza from Nasopharyngeal swab specimens and  should not be used as a sole basis for treatment. Nasal washings and  aspirates are unacceptable for Xpert Xpress SARS-CoV-2/FLU/RSV  testing.  Fact Sheet for Patients: PinkCheek.be  Fact Sheet for Healthcare Providers: GravelBags.it  This test is not yet approved or cleared by the [place] FDA and  has been authorized for detection and/or diagnosis of SARS-CoV-2 by  FDA under an Emergency Use Authorization (EUA). This EUA will remain  in effect (meaning  this test can be used) for the duration of the  Covid-19 declaration under Section 564(b)(1) of the Act, 21  U.S.C. section 360bbb-3(b)(1), unless the authorization is  terminated or revoked. Performed at [facility], [address], [place], [place] [address]     Time coordinating discharge:  25 minutes.   SIGNED:  Ivor Costa, MD [facility] 12/18/2019, 2:56 PM   If 7PM-7AM, please contact night-coverage [email or web address]

## 2019-12-18 NOTE — H&P (Signed)
History and Physical    Courtney Pena OMV:[id number] DOB: 05/22/1940 DOA: 12/18/2019  Referring MD/NP/PA:   PCP: [name], MD   Patient coming from:  The patient is coming from home.  At baseline, pt is independent for most of ADL.        Chief Complaint:  Chest pain  HPI: Courtney Pena is a 79 y.o. female with medical history significant of atrial fibrillation, hypertension, hyperlipidemia, diabetes mellitus, stroke, GERD, hypothyroidism, gout, OSA, CHF, CAD, GI bleeding, anemia, CKD stage III, who presents with chest pain.  Patient states chest pain started approximately 7:30 PM last night.  It is located in central chest, mild, described as indigestion feeling, nonradiating.  He also has mild shortness of breath, no cough, fever or chills.  Patient was found to have atrial fibrillation with RVR with heart rate up to 140s by EMS.  Patient does not have nausea vomiting or diarrhea.  Patient states that she has had some abdominal discomfort due to "gas".  No symptoms of UTI.  ED Course: pt was found to have trop 11 -->16 -->23, pending COVID-19 PCR, WBC 8.7, stable renal function, temperature normal, blood pressure 133/76, RR 29, oxygen saturation 94% on room air.  Chest x-ray negative for infiltration, but showed mild cardiomegaly.  Patient is placed on progressive benefit observation.  Dr. Saunders Revel of cardiology is consulted.  Review of Systems:   General: no fevers, chills, no body weight gain,  HEENT: no blurry vision, hearing changes or sore throat Respiratory: has dyspnea,  No coughing, wheezing CV: has chest pain, palpitations GI: no nausea, vomiting, abdominal pain, diarrhea, constipation GU: no dysuria, burning on urination, increased urinary frequency, hematuria  Ext: no leg edema Neuro: no unilateral weakness, numbness, or tingling, no vision change or hearing loss Skin: no rash, no skin tear. MSK: No muscle spasm, no deformity, no  limitation of range of movement in spin Heme: No easy bruising.  Travel history: No recent long distant travel.  Allergy:  Allergies  Allergen Reactions  . Lisinopril Swelling    Swelling, may have had some breathing involvement.   . Nsaids Other (See Comments)    Patient reports internal bleeding. Experienced with tolmetin.   Marland Kitchen Penicillins Shortness Of Breath and Swelling    Arm Swelling with Penicillin (Occurred in 1960s) Breathing - throat swelling with Amoxicillin (Occurred prior to 2002)  . Sulfamethoxazole Hives and Itching    "welps all over" immediately after dose  . Actos [Pioglitazone] Swelling    Swelling all over body  . Flecainide Other (See Comments)    Blurry  vision  . Gabapentin Other (See Comments)    Caused dysphoria   . Hctz [Hydrochlorothiazide] Other (See Comments)    Gout  . Iodinated Diagnostic Agents Itching    Pt. Developed mild itching after receiving IV cm; pt. Held; Dr. Keane Scrape recomended she take 50 mg of benadryl when she goes home-if necessary; Dr Mickey Farber recommends benadryl prior to future exams requiring contrast media, but stated other doctors may recommend another premedication prep.  Vania Rea [Empagliflozin] Other (See Comments)    Patient reported "it made her kidneys not work"  Patient resists retrial/alternative.  . Statins Other (See Comments)    Muscle aches with multiple statins. Able to tolerate rosuvastatin.  . Losartan Potassium Other (See Comments)    Lower extremity swelling  . Zetia [Ezetimibe] Other (See Comments)    Cramps    Past Medical History:  Diagnosis Date  .  Allergy   . Anemia due to GI blood loss 08/12/2011  . Arthritis   . Atrial flutter ([place]) 12-07-2010   converted in ED with 300 mg flecainide  . CAD (coronary artery disease)    a. mild per cath in 2004;  b. nonischemic Myoview in March 2012;  c. Lex MV 1/14:  EF 66%, no ischemia  . Cataract   . Chronic anticoagulation - Xarelto started 07/07/2015,  CHADS2CVASC=5 07/07/2015  . Clotting disorder ([place])   . Diastolic CHF ([place]) 1/61/0960  . External hemorrhoids 06/07/2010  . Gastric antral vascular ectasia    source for gi bleed in 07/2011 - Xarelto stopped  . Gastritis   . GERD (gastroesophageal reflux disease)   . Hiatal hernia   . Hyperlipidemia   . Hypertension   . Hypothyroidism   . Neuromuscular disorder ([place])   . Obesity   . Paroxysmal atrial fibrillation (HCC)   . Personal history of colonic polyps 06/06/2009   cecal polyp  . Sleep apnea    does not use CPAP  . Type 2 diabetes mellitus with diabetic chronic kidney disease ([place]) 04/25/2006      . Ulcer     Past Surgical History:  Procedure Laterality Date  . BREAST BIOPSY Left   . BREAST EXCISIONAL BIOPSY Left    benign  . CARDIAC CATHETERIZATION  1999&2004  . CARPAL TUNNEL RELEASE Bilateral 2003  . CATARACT EXTRACTION Bilateral   . COLONOSCOPY    . ESOPHAGOGASTRODUODENOSCOPY  08/13/2011   Procedure: ESOPHAGOGASTRODUODENOSCOPY (EGD);  Surgeon: Gatha Mayer, MD;  Location: [facility] ENDOSCOPY;  Service: Endoscopy;  Laterality: N/A;  . POLYPECTOMY      Social History:  reports that she quit smoking about 26 years ago. Her smoking use included cigarettes. She has a 60.00 pack-year smoking history. She has never used smokeless tobacco. She reports that she does not drink alcohol and does not use drugs.  Family History:  Family History  Problem Relation Age of Onset  . Heart disease Father   . Diabetes Maternal Grandfather   . Hypertension Mother   . Other Mother        brain tumor-benign  . Diabetes Daughter        pre-diabeties  . Colon polyps Daughter   . Thyroid disease Daughter        x 2  . Colon cancer Neg Hx   . Esophageal cancer Neg Hx   . Stomach cancer Neg Hx   . Rectal cancer Neg Hx      Prior to Admission medications   Medication Sig Start Date End Date Taking? Authorizing Provider  acetaminophen (TYLENOL) 650 MG CR tablet Take 650 mg by mouth every  8 (eight) hours as needed for pain.    [provider]  allopurinol (ZYLOPRIM) 100 MG tablet Take 1 tablet (100 mg total) by mouth daily. 11/26/19   [name], MD  apixaban (ELIQUIS) 5 MG TABS tablet Take 1 tablet (5 mg total) by mouth 2 (two) times daily. 10/08/19   [name], MD  Apoaequorin (PREVAGEN PO) Take 1 tablet by mouth.    [provider]  blood glucose meter kit and supplies Dispense based on patient and insurance preference. Use up to four times daily as directed. (FOR ICD-10 E10.9, E11.9). 09/14/19   [name], MD  Cholecalciferol (VITAMIN D3) 2000 units TABS Take 2 capsules by mouth daily.     [provider]  Coenzyme Q10 (COQ10) 100 MG CAPS Take  100 mEq by mouth daily.  06/28/05   [provider]  colchicine 0.6 MG tablet TAKE 1 TABLET BY MOUTH EVERY DAY 11/05/19   Hyatt, Max T, DPM  diclofenac sodium (VOLTAREN) 1 % GEL Apply 2 g topically 4 (four) times daily. 11/25/18   [name], MD  diltiazem (CARDIZEM CD) 360 MG 24 hr capsule TAKE 1 CAPSULE BY MOUTH  DAILY 05/04/19   Hensel, Jamal Collin, MD  diltiazem (CARDIZEM) 30 MG tablet Take 1 tablet (30 mg total) by mouth every 4 (four) hours as needed (elevated HR as long as BP > 100). 10/12/19   Josue Hector, MD  ferrous sulfate 325 (65 FE) MG EC tablet Take 650 mg by mouth daily.     [provider]  glipiZIDE (GLUCOTROL) 5 MG tablet Take 5 mg by mouth 2 (two) times daily before a meal.    [provider]  glucose blood (ONETOUCH ULTRA) test strip 1 Container by Other route every morning. 05/21/16   [provider]  glucose blood test strip Use to test blood sugar 3 times daily. 12/14/19   [name], MD  hydrALAZINE (APRESOLINE) 25 MG tablet TAKE 1 TABLET BY MOUTH TWICE A DAY 05/04/19   Burtis Junes, NP  Insulin Pen Needle 31G X 8 MM MISC Use with pen to inject once daily. 05/13/18   [name], MD  Lancets [facility] ULTRASOFT) lancets  Use to check blood sugar daily. 02/13/17   [name], MD  levothyroxine (SYNTHROID) 112 MCG tablet TAKE 1 TABLET BY MOUTH  DAILY 05/11/19   [name], MD  meclizine (ANTIVERT) 25 MG tablet TAKE 1 TABLET (25 MG TOTAL) BY MOUTH AS NEEDED. 07/06/19   [name], MD  metFORMIN (GLUCOPHAGE-XR) 500 MG 24 hr tablet Take 4 tablets (2,000 mg total) by mouth daily. 05/21/19   McDiarmid, Blane Ohara, MD  metoprolol succinate (TOPROL-XL) 100 MG 24 hr tablet TAKE 1 TABLET BY MOUTH 2  TIMES DAILY. TAKE WITH OR  IMMEDIATELY FOLLOWING A  MEAL. 12/14/19   [name], MD  Multiple Vitamin (MULTI-VITAMINS) TABS Take daily by mouth. 06/28/05   [provider]  nitroGLYCERIN (NITROSTAT) 0.4 MG SL tablet Place 1 tablet (0.4 mg total) under the tongue every 5 (five) minutes as needed for chest pain (Call 911 if chest pain after three doses). 09/14/19   [name], MD  omeprazole (PRILOSEC) 40 MG capsule TAKE 1 CAPSULE BY MOUTH  DAILY 11/03/19   McDiarmid, Blane Ohara, MD  predniSONE 5 MG TBEC Take 5 mg by mouth daily. For 14 days    [provider]  rosuvastatin (CRESTOR) 10 MG tablet TAKE 1 TABLET (10 MG TOTAL) BY MOUTH 2 (TWO) TIMES A WEEK. 05/27/19   Hensel, Jamal Collin, MD  Semaglutide, 1 MG/DOSE, 2 MG/1.5ML SOPN Inject 1 mg into the skin once a week. 05/25/19   [name], MD  tobramycin (TOBREX) 0.3 % ophthalmic solution Place 1 drop into both eyes as directed. 08/25/19   [provider]  triamcinolone cream (KENALOG) 0.1 % Apply 1 application topically as needed (dermatitis).    [provider]    Physical Exam: Vitals:   12/18/19 0600 12/18/19 0605 12/18/19 0610 12/18/19 0828  BP: 133/76   137/77  Pulse: (!) 58 80 78 77  Resp: '16 14 17 16  ' Temp:      TempSrc:      SpO2: 98% 98% 96% 98%  Weight:  Height:       General: Not in acute distress HEENT:       Eyes: PERRL, EOMI, no scleral icterus.       ENT: No discharge from the ears and nose, no  pharynx injection, no tonsillar enlargement.        Neck: No JVD, no bruit, no mass felt. Heme: No neck lymph node enlargement. Cardiac: S1/S2, irregularly irregular rhythm, No murmurs, No gallops or rubs. Respiratory: No rales, wheezing, rhonchi or rubs. GI: Soft, nondistended, nontender, no rebound pain, no organomegaly, BS present. GU: No hematuria Ext: No pitting leg edema bilaterally. 2+DP/PT pulse bilaterally. Musculoskeletal: No joint deformities, No joint redness or warmth, no limitation of ROM in spin. Skin: No rashes.  Neuro: Alert, oriented X3, cranial nerves II-XII grossly intact, moves all extremities normally.   Psych: Patient is not psychotic, no suicidal or hemocidal ideation.  Labs on Admission: I have personally reviewed following labs and imaging studies  CBC: Recent Labs  Lab 12/18/19 0325  WBC 8.7  HGB 10.3*  HCT 32.5*  MCV 87.1  PLT 740   Basic Metabolic Panel: Recent Labs  Lab 12/18/19 0325  NA 140  K 4.3  CL 106  CO2 21*  GLUCOSE 164*  BUN 23  CREATININE 1.04*  CALCIUM 9.5   GFR: Estimated Creatinine Clearance: 49.4 mL/min (A) (by C-G formula based on SCr of 1.04 mg/dL (H)). Liver Function Tests: Recent Labs  Lab 12/18/19 0325  AST 21  ALT 14  ALKPHOS 65  BILITOT 0.6  PROT 7.2  ALBUMIN 3.8   No results for input(s): LIPASE, AMYLASE in the last 168 hours. No results for input(s): AMMONIA in the last 168 hours. Coagulation Profile: No results for input(s): INR, PROTIME in the last 168 hours. Cardiac Enzymes: No results for input(s): CKTOTAL, CKMB, CKMBINDEX, TROPONINI in the last 168 hours. BNP (last 3 results) No results for input(s): PROBNP in the last 8760 hours. HbA1C: No results for input(s): HGBA1C in the last 72 hours. CBG: No results for input(s): GLUCAP in the last 168 hours. Lipid Profile: No results for input(s): CHOL, HDL, LDLCALC, TRIG, CHOLHDL, LDLDIRECT in the last 72 hours. Thyroid Function Tests: Recent Labs     12/18/19 0537  TSH 5.192*   Anemia Panel: No results for input(s): VITAMINB12, FOLATE, FERRITIN, TIBC, IRON, RETICCTPCT in the last 72 hours. Urine analysis:    Component Value Date/Time   COLORURINE YELLOW 08/12/2011 0215   APPEARANCEUR CLOUDY (A) 08/12/2011 0215   LABSPEC 1.020 08/12/2011 0215   PHURINE 5.0 08/12/2011 0215   GLUCOSEU NEGATIVE 08/12/2011 0215   HGBUR NEGATIVE 08/12/2011 0215   HGBUR negative 10/01/2008 1033   BILIRUBINUR small (A) 03/13/2017 1508   BILIRUBINUR NEG 11/09/2015 1036   KETONESUR small (15) (A) 03/13/2017 1508   KETONESUR NEGATIVE 08/12/2011 0215   PROTEINUR =100 (A) 03/13/2017 1508   PROTEINUR 100 11/09/2015 1036   PROTEINUR NEGATIVE 08/12/2011 0215   UROBILINOGEN 0.2 03/13/2017 1508   UROBILINOGEN 0.2 08/12/2011 0215   NITRITE Negative 03/13/2017 1508   NITRITE NEG 11/09/2015 1036   NITRITE NEGATIVE 08/12/2011 0215   LEUKOCYTESUR Trace (A) 03/13/2017 1508   Sepsis Labs: '@LABRCNTIP' (procalcitonin:4,lacticidven:4) ) Recent Results (from the past 240 hour(s))  Respiratory Panel by RT PCR (Flu A&B, Covid) - Nasopharyngeal Swab     Status: None   Collection Time: 12/18/19  8:17 AM   Specimen: Nasopharyngeal Swab  Result Value Ref Range Status   SARS Coronavirus 2 by RT PCR NEGATIVE NEGATIVE  Final    Comment: (NOTE) SARS-CoV-2 target nucleic acids are NOT DETECTED.  The SARS-CoV-2 RNA is generally detectable in upper respiratoy specimens during the acute phase of infection. The lowest concentration of SARS-CoV-2 viral copies this assay can detect is 131 copies/mL. A negative result does not preclude SARS-Cov-2 infection and should not be used as the sole basis for treatment or other patient management decisions. A negative result may occur with  improper specimen collection/handling, submission of specimen other than nasopharyngeal swab, presence of viral mutation(s) within the areas targeted by this assay, and inadequate number of viral  copies (<131 copies/mL). A negative result must be combined with clinical observations, patient history, and epidemiological information. The expected result is Negative.  Fact Sheet for Patients:  PinkCheek.be  Fact Sheet for Healthcare Providers:  GravelBags.it  This test is no t yet approved or cleared by the [place] FDA and  has been authorized for detection and/or diagnosis of SARS-CoV-2 by FDA under an Emergency Use Authorization (EUA). This EUA will remain  in effect (meaning this test can be used) for the duration of the COVID-19 declaration under Section 564(b)(1) of the Act, 21 U.S.C. section 360bbb-3(b)(1), unless the authorization is terminated or revoked sooner.     Influenza A by PCR NEGATIVE NEGATIVE Final   Influenza B by PCR NEGATIVE NEGATIVE Final    Comment: (NOTE) The Xpert Xpress SARS-CoV-2/FLU/RSV assay is intended as an aid in  the diagnosis of influenza from Nasopharyngeal swab specimens and  should not be used as a sole basis for treatment. Nasal washings and  aspirates are unacceptable for Xpert Xpress SARS-CoV-2/FLU/RSV  testing.  Fact Sheet for Patients: PinkCheek.be  Fact Sheet for Healthcare Providers: GravelBags.it  This test is not yet approved or cleared by the [place] FDA and  has been authorized for detection and/or diagnosis of SARS-CoV-2 by  FDA under an Emergency Use Authorization (EUA). This EUA will remain  in effect (meaning this test can be used) for the duration of the  Covid-19 declaration under Section 564(b)(1) of the Act, 21  U.S.C. section 360bbb-3(b)(1), unless the authorization is  terminated or revoked. Performed at [facility], [address] [place],  [address]      Radiological Exams on Admission: CT ABDOMEN PELVIS WO CONTRAST  Result Date: 12/18/2019 CLINICAL DATA:   Upper abdominal pain radiating to the lower abdomen. Nausea and intermittent diarrhea. Symptoms started 2 weeks ago. EXAM: CT ABDOMEN AND PELVIS WITHOUT CONTRAST TECHNIQUE: Multidetector CT imaging of the abdomen and pelvis was performed following the standard protocol without IV contrast. COMPARISON:  03/22/2017 FINDINGS: Lower chest: Lung bases are clear. Cardiac enlargement with small pericardial effusion. Coronary artery calcifications. Hepatobiliary: No focal liver abnormality is seen. No gallstones, gallbladder wall thickening, or biliary dilatation. Pancreas: Unremarkable. No pancreatic ductal dilatation or surrounding inflammatory changes. Spleen: Normal in size without focal abnormality. Adrenals/Urinary Tract: No adrenal gland nodules. Multiple subcentimeter cysts on both kidneys, some demonstrating increased density consistent with hemorrhagic cysts. No hydronephrosis or hydroureter. No renal or ureteral stones. Bladder is normal. Stomach/Bowel: Stomach is within normal limits. Appendix appears normal. No evidence of bowel wall thickening, distention, or inflammatory changes. Diverticulosis of the sigmoid colon. No evidence of diverticulitis. Vascular/Lymphatic: Aortic atherosclerosis. No enlarged abdominal or pelvic lymph nodes. Reproductive: Uterus and bilateral adnexa are unremarkable. Other: Small periumbilical hernia containing fat. No free air or free fluid in the abdomen. Musculoskeletal: Degenerative changes in the spine. No destructive bone lesions. IMPRESSION: 1. No  acute process demonstrated in the abdomen or pelvis. No evidence of bowel obstruction or inflammation. Diverticulosis of the sigmoid colon without evidence of diverticulitis. 2. Cardiac enlargement with small pericardial effusion. 3. Multiple bilateral renal cysts, some demonstrating increased density consistent with hemorrhagic cysts. 4. Small periumbilical hernia containing fat. 5. Aortic atherosclerosis. Aortic Atherosclerosis  (ICD10-I70.0). Electronically Signed   By: Lucienne Capers M.D.   On: 12/18/2019 06:47   DG Chest Port 1 View  Result Date: 12/18/2019 CLINICAL DATA:  79 year old female with palpitation. EXAM: PORTABLE CHEST 1 VIEW COMPARISON:  Chest radiograph dated 07/28/2017 FINDINGS: Mild chronic interstitial coarsening and bronchitic changes. Faint mid to lower lung field interstitial prominence may represent atelectasis or mild congestion. Clinical correlation is recommended. No focal consolidation, pleural effusion, pneumothorax. There is mild cardiomegaly. Atherosclerotic calcification of the aorta. No acute osseous pathology. IMPRESSION: 1. No focal consolidation. 2. Mild cardiomegaly. Electronically Signed   By: Anner Crete M.D.   On: 12/18/2019 03:42     EKG: I have personally reviewed.  Atrial fibrillation, QTc 476, poor R wave question, low voltage.  Assessment/Plan Principal Problem:   Chest pain Active Problems:   Hypothyroidism   HYPERCHOLESTEROLEMIA   HYPERTENSION, BENIGN SYSTEMIC   Coronary atherosclerosis   Reflux esophagitis   Anemia due to GI blood loss   Hyperlipidemia associated with type 2 diabetes mellitus (HCC)   Chronic gout of multiple sites   Chronic diastolic CHF (congestive heart failure) (HCC)   History of CVA (cerebrovascular accident)   Atrial fibrillation with RVR (HCC)   Type II diabetes mellitus with renal manifestations (HCC)   CKD (chronic kidney disease), stage IIIa   Chest pain, elevated troponin and hx of CAD: Troponin 11 -->16 -->23 -->18.  Likely due to demand ischemia secondary to atrial fibrillation with RVR.  Cardiology, Dr. Saunders Revel is consulted.  2D echo was done, which showed EF of 65-70% with a small pericardial effusion. Patient's atrial fibrillation with RVR was treated with Cardizem, his rhythm was converted to sinus rhythm.  Heart rate improved to 70s.  Per cardiologist, patient can be discharged home and follow-up with her cardiologist Dr. Johnsie Cancel  and the atrial fibrillation clinic.  - placed to progressive unit for observation - Trend Trop - Repeat EKG in the am  - prn Nitroglycerin, Morphine, and Crestor  - Risk factor stratification: will check FLP and A1C   Atrial fibrillation with RVR: Patient was treated with Cardizem by IV ED, she is converted to normal sinus rhythm.  Heart rate is 70s. -Continue Cardizem -Resume home metoprolol 100 mg twice daily per card -Continue Eliquis  Hypothyroidism -Synthroid  HYPERCHOLESTEROLEMIA -Crestor  HYPERTENSION, BENIGN SYSTEMIC -IV hydralazine as needed -Cardizem, metoprolol  Reflux esophagitis -Protonix  Anemia due to GI blood loss: Hemoglobin stable 10.3 -Continue iron supplement  Hyperlipidemia associated with type 2 diabetes mellitus (HCC) -Crestor  Chronic gout of multiple sites -Colchicine  Chronic diastolic CHF (congestive heart failure) ([place]): 2D echo today showed EF 65-70%.  Patient does not have leg edema JVD.  CHF is compensated. -Watch volume status closely  History of CVA (cerebrovascular accident) -Patient is on Eliquis for A. Fib -Continue Crestor  Type II diabetes mellitus with renal manifestations [facility]): Patient is taking Metformin, glipizide, semaglutide at home.  A1c 8.8 recently, poorly controlled --Sliding scale insulin  CKD (chronic kidney disease), stage IIIa.  Stable -Follow-up with BMP         DVT ppx: Eliquis Code Status: Full code Family Communication:   Yes, patient's  daughter at bed side Disposition Plan:  Anticipate discharge back to previous environment Consults called:  Dr. Saunders Revel of card Admission status: Med-surg bed for obs    Status is: Observation  The patient remains OBS appropriate and will d/c before 2 midnights.  Dispo: The patient is from: Home              Anticipated d/c is to: Home              Anticipated d/c date is: 1 day              Patient currently is not medically stable to  d/c.            Date of Service 12/18/2019    Ivor Costa [facility]   If 7PM-7AM, please contact night-coverage [email or web address] 12/18/2019, 10:32 AM

## 2019-12-18 NOTE — Discharge Instructions (Signed)
You were cared for by a hospitalist during your hospital stay. If you have any questions about your discharge medications or the care you received while you were in the hospital after you are discharged, you can call the unit and ask to speak with the hospitalist on call if the hospitalist that took care of you is not available. Once you are discharged, your primary care physician will handle any further medical issues. Please note that NO REFILLS for any discharge medications will be authorized once you are discharged, as it is imperative that you return to your primary care physician (or establish a relationship with a primary care physician if you do not have one) for your aftercare needs so that they can reassess your need for medications and monitor your lab values.  Follow up with cardiologist, Dr. [name]. Take all medications as prescribed. If symptoms change or worsen please return to the ED for evaluation

## 2019-12-18 NOTE — ED Provider Notes (Signed)
[facility] Emergency Department Provider Note  ____________________________________________   First MD Initiated Contact with Patient 12/18/19 0319     (approximate)  I have reviewed the triage vital signs and the nursing notes.   HISTORY  Chief Complaint Chest Pain   HPI Courtney Pena Maud Deed is a 79 y.o. female with below list of previous medical conditions including atrial fibrillation currently taking Xarelto, CAD hypertension hyperlipidemia presents to the emergency department via EMS secondary to rapid irregular heartbeat which patient states started approximately 7:30 PM last night.  Patient also admits to "indigestion" pointing to her epigastrium as the location of her discomfort.  Patient denies any dyspnea no nausea vomiting or diaphoresis.  EMS noted the patient to be in atrial fibrillation with rapid ventricular response and she presents to the emergency department in the same state.  Maximum heart rate of 140s noted       Past Medical History:  Diagnosis Date  . Allergy   . Anemia due to GI blood loss 08/12/2011  . Arthritis   . Atrial flutter ([place]) 12-07-2010   converted in ED with 300 mg flecainide  . CAD (coronary artery disease)    a. mild per cath in 2004;  b. nonischemic Myoview in March 2012;  c. Lex MV 1/14:  EF 66%, no ischemia  . Cataract   . Chronic anticoagulation - Xarelto started 07/07/2015, CHADS2CVASC=5 07/07/2015  . Clotting disorder ([place])   . Diastolic CHF ([place]) 3/35/4562  . External hemorrhoids 06/07/2010  . Gastric antral vascular ectasia    source for gi bleed in 07/2011 - Xarelto stopped  . Gastritis   . GERD (gastroesophageal reflux disease)   . Hiatal hernia   . Hyperlipidemia   . Hypertension   . Hypothyroidism   . Neuromuscular disorder ([place])   . Obesity   . Paroxysmal atrial fibrillation (HCC)   . Personal history of colonic polyps 06/06/2009   cecal polyp  . Sleep apnea    does not use CPAP  .  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 Ulcer     Patient Active Problem List   Diagnosis Date Noted  . Breast pain, right 09/14/2019  . Lumbar back pain with radiculopathy affecting left lower extremity 07/30/2019  . Preventative health care 11/24/2018  . History of CVA (cerebrovascular accident) 11/24/2018  . Rotator cuff syndrome of right shoulder 11/24/2018  . Lower abdominal pain 08/28/2018  . Myalgia due to statin 08/28/2018  . Diabetic retinopathy, nonproliferative, moderate ([place]) 03/07/2018  . Headache 11/28/2017  . Diastolic CHF ([place]) 56/38/9373  . Diabetic neuropathy ([place]) 10/17/2016  . Sudden hearing loss, left 09/25/2016  . Transaminitis 11/10/2015  . Chronic anticoag - Xarelto 05//2017, CHADS2CVASC=5 Eliquis 9/20 07/07/2015  . Left sided chest pain 05/12/2015  . Subcutaneous cyst 07/09/2014  . Fatigue 06/24/2014  . Chronic gout of multiple sites 03/23/2014  . Degenerative arthritis of thumb 02/12/2014  . Unspecified vitamin D deficiency 09/18/2013  . Hyperlipidemia associated with type 2 diabetes mellitus ([place]) 08/22/2012  . Pseudophakia, both eyes 03/12/2012  . Nummular eczema 10/10/2011  . 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 Anemia due to GI blood loss 08/12/2011  . Pulmonary nodule 12/26/2010  . Paroxysmal atrial fibrillation ([place]) 12/14/2010  . After-cataract obscuring vision 11/20/2010  . Hypothyroidism 04/25/2006  . Type 2 diabetes mellitus with diabetic chronic kidney disease ([place]) 04/25/2006  . HYPERCHOLESTEROLEMIA 04/25/2006  . Obesity (BMI 30.0-34.9)  04/25/2006  . HYPERTENSION, BENIGN SYSTEMIC 04/25/2006  . Coronary atherosclerosis 04/25/2006  . Reflux esophagitis 04/25/2006  . DIVERTICULOSIS OF COLON 04/25/2006  . DJD, UNSPECIFIED 04/25/2006  . VERTIGO NOS OR DIZZINESS 04/25/2006  . Sleep apnea 04/25/2006    Past Surgical History:  Procedure Laterality Date  . BREAST BIOPSY Left   .  BREAST EXCISIONAL BIOPSY Left    benign  . CARDIAC CATHETERIZATION  1999&2004  . CARPAL TUNNEL RELEASE Bilateral 2003  . CATARACT EXTRACTION Bilateral   . COLONOSCOPY    . ESOPHAGOGASTRODUODENOSCOPY  08/13/2011   Procedure: ESOPHAGOGASTRODUODENOSCOPY (EGD);  Surgeon: Gatha Mayer, MD;  Location: [facility] ENDOSCOPY;  Service: Endoscopy;  Laterality: N/A;  . POLYPECTOMY      Prior to Admission medications   Medication Sig Start Date End Date Taking? Authorizing Provider  acetaminophen (TYLENOL) 650 MG CR tablet Take 650 mg by mouth every 8 (eight) hours as needed for pain.    [provider]  allopurinol (ZYLOPRIM) 100 MG tablet Take 1 tablet (100 mg total) by mouth daily. 11/26/19   [name], MD  apixaban (ELIQUIS) 5 MG TABS tablet Take 1 tablet (5 mg total) by mouth 2 (two) times daily. 10/08/19   [name], MD  Apoaequorin (PREVAGEN PO) Take 1 tablet by mouth.    [provider]  blood glucose meter kit and supplies Dispense based on patient and insurance preference. Use up to four times daily as directed. (FOR ICD-10 E10.9, E11.9). 09/14/19   [name], MD  Cholecalciferol (VITAMIN D3) 2000 units TABS Take 2 capsules by mouth daily.     [provider]  Coenzyme Q10 (COQ10) 100 MG CAPS Take 100 mEq by mouth daily.  06/28/05   [provider]  colchicine 0.6 MG tablet TAKE 1 TABLET BY MOUTH EVERY DAY 11/05/19   Hyatt, Max T, DPM  diclofenac sodium (VOLTAREN) 1 % GEL Apply 2 g topically 4 (four) times daily. 11/25/18   [name], MD  diltiazem (CARDIZEM CD) 360 MG 24 hr capsule TAKE 1 CAPSULE BY MOUTH  DAILY 05/04/19   Hensel, Jamal Collin, MD  diltiazem (CARDIZEM) 30 MG tablet Take 1 tablet (30 mg total) by mouth every 4 (four) hours as needed (elevated HR as long as BP > 100). 10/12/19   Josue Hector, MD  ferrous sulfate 325 (65 FE) MG EC tablet Take 650 mg by mouth daily.     [provider]  glipiZIDE (GLUCOTROL) 5 MG tablet  Take 5 mg by mouth 2 (two) times daily before a meal.    [provider]  glucose blood (ONETOUCH ULTRA) test strip 1 Container by Other route every morning. 05/21/16   [provider]  glucose blood test strip Use to test blood sugar 3 times daily. 12/14/19   [name], MD  hydrALAZINE (APRESOLINE) 25 MG tablet TAKE 1 TABLET BY MOUTH TWICE A DAY 05/04/19   Burtis Junes, NP  Insulin Pen Needle 31G X 8 MM MISC Use with pen to inject once daily. 05/13/18   [name], MD  Lancets [facility] ULTRASOFT) lancets Use to check blood sugar daily. 02/13/17   [name], MD  levothyroxine (SYNTHROID) 112 MCG tablet TAKE 1 TABLET BY MOUTH  DAILY 05/11/19   [name], MD  meclizine (ANTIVERT) 25 MG tablet TAKE 1 TABLET (25 MG TOTAL) BY MOUTH AS NEEDED. 07/06/19   [name], MD  metFORMIN (GLUCOPHAGE-XR) 500 MG 24 hr  tablet Take 4 tablets (2,000 mg total) by mouth daily. 05/21/19   McDiarmid, Blane Ohara, MD  metoprolol succinate (TOPROL-XL) 100 MG 24 hr tablet TAKE 1 TABLET BY MOUTH 2  TIMES DAILY. TAKE WITH OR  IMMEDIATELY FOLLOWING A  MEAL. 12/14/19   [name], MD  Multiple Vitamin (MULTI-VITAMINS) TABS Take daily by mouth. 06/28/05   [provider]  nitroGLYCERIN (NITROSTAT) 0.4 MG SL tablet Place 1 tablet (0.4 mg total) under the tongue every 5 (five) minutes as needed for chest pain (Call 911 if chest pain after three doses). 09/14/19   [name], MD  omeprazole (PRILOSEC) 40 MG capsule TAKE 1 CAPSULE BY MOUTH  DAILY 11/03/19   McDiarmid, Blane Ohara, MD  predniSONE 5 MG TBEC Take 5 mg by mouth daily. For 14 days    [provider]  rosuvastatin (CRESTOR) 10 MG tablet TAKE 1 TABLET (10 MG TOTAL) BY MOUTH 2 (TWO) TIMES A WEEK. 05/27/19   Hensel, Jamal Collin, MD  Semaglutide, 1 MG/DOSE, 2 MG/1.5ML SOPN Inject 1 mg into the skin once a week. 05/25/19   [name], MD  tobramycin (TOBREX) 0.3 % ophthalmic solution Place 1 drop into both  eyes as directed. 08/25/19   [provider]  triamcinolone cream (KENALOG) 0.1 % Apply 1 application topically as needed (dermatitis).    [provider]    Allergies Lisinopril, Nsaids, Penicillins, Sulfamethoxazole, Actos [pioglitazone], Flecainide, Gabapentin, Hctz [hydrochlorothiazide], Iodinated diagnostic agents, Jardiance [empagliflozin], Statins, Losartan potassium, and Zetia [ezetimibe]  Family History  Problem Relation Age of Onset  . Heart disease Father   . Diabetes Maternal Grandfather   . Hypertension Mother   . Other Mother        brain tumor-benign  . Diabetes Daughter        pre-diabeties  . Colon polyps Daughter   . Thyroid disease Daughter        x 2  . Colon cancer Neg Hx   . Esophageal cancer Neg Hx   . Stomach cancer Neg Hx   . Rectal cancer Neg Hx     Social History Social History   Tobacco Use  . Smoking status: Former Smoker    Packs/day: 2.00    Years: 30.00    Pack years: 60.00    Types: Cigarettes    Quit date: 02/26/1993    Years since quitting: 26.8  . Smokeless tobacco: Never Used  Vaping Use  . Vaping Use: Never used  Substance Use Topics  . Alcohol use: No    Alcohol/week: 0.0 standard drinks  . Drug use: No    Review of Systems Constitutional: No fever/chills Eyes: No visual changes. ENT: No sore throat. Cardiovascular: Positive for palpitations. Respiratory: Denies shortness of breath. Gastrointestinal: Positive for abdominal pain.  No nausea, no vomiting.  No diarrhea.  No constipation. Genitourinary: Negative for dysuria. Musculoskeletal: Negative for neck pain.  Negative for back pain. Integumentary: Negative for rash. Neurological: Negative for headaches, focal weakness or numbness.   ____________________________________________   PHYSICAL EXAM:  VITAL SIGNS: ED Triage Vitals  Enc Vitals Group     BP 12/18/19 0314 (!) 155/99     Pulse Rate 12/18/19 0333 (!) 101     Resp 12/18/19 0333 12      Temp 12/18/19 0314 98.6 F (37 C)     Temp Source 12/18/19 0314 Oral     SpO2 12/18/19 0314 96 %     Weight 12/18/19 0314 93 kg (205 lb)  Height 12/18/19 0314 1.651 m (_0 )     Head Circumference --      Peak Flow --      Pain Score 12/18/19 0307 5     Pain Loc --      Pain Edu? --      Excl. in [place]? --     Constitutional: Alert and oriented.  Eyes: Conjunctivae are normal.  Head: Atraumatic. Mouth/Throat: Patient is wearing a mask. Neck: No stridor.  No meningeal signs.   Cardiovascular: Normal rate, regular rhythm. Good peripheral circulation. Grossly normal heart sounds. Respiratory: Normal respiratory effort.  No retractions. Gastrointestinal: Soft and nontender. No distention.  Musculoskeletal: No lower extremity tenderness nor edema. No gross deformities of extremities. Neurologic:  Normal speech and language. No gross focal neurologic deficits are appreciated.  Skin:  Skin is warm, dry and intact. Psychiatric: Mood and affect are normal. Speech and behavior are normal.  ____________________________________________   LABS (all labs ordered are listed, but only abnormal results are displayed)  Labs Reviewed  CBC - Abnormal; Notable for the following components:      Result Value   RBC 3.73 (*)    Hemoglobin 10.3 (*)    HCT 32.5 (*)    RDW 18.5 (*)    All other components within normal limits  COMPREHENSIVE METABOLIC PANEL - Abnormal; Notable for the following components:   CO2 21 (*)    Glucose, Bld 164 (*)    Creatinine, Ser 1.04 (*)    GFR, Estimated 55 (*)    All other components within normal limits  TROPONIN I (HIGH SENSITIVITY)  TROPONIN I (HIGH SENSITIVITY)   ____________________________________________  EKG  ED ECG REPORT I, Larue N Winfred Iiams, the attending physician, personally viewed and interpreted this ECG.   Date: 12/18/2019  EKG Time: 3:11 AM  Rate: 118  Rhythm: Atrial fibrillation with rapid ventricular response  Axis: Normal   Intervals: Normal  ST&T Change: None  ____________________________________________  RADIOLOGY I, Irwin N Liliyana Thobe, personally viewed and evaluated these images (plain radiographs) as part of my medical decision making, as well as reviewing the written report by the radiologist.  ED MD interpretation: No focal consolidation, mild cardiomegaly noted on chest x-ray  Official radiology report(s): DG Chest Port 1 View  Result Date: 12/18/2019 CLINICAL DATA:  79 year old female with palpitation. EXAM: PORTABLE CHEST 1 VIEW COMPARISON:  Chest radiograph dated 07/28/2017 FINDINGS: Mild chronic interstitial coarsening and bronchitic changes. Faint mid to lower lung field interstitial prominence may represent atelectasis or mild congestion. Clinical correlation is recommended. No focal consolidation, pleural effusion, pneumothorax. There is mild cardiomegaly. Atherosclerotic calcification of the aorta. No acute osseous pathology. IMPRESSION: 1. No focal consolidation. 2. Mild cardiomegaly. Electronically Signed   By: Anner Crete M.D.   On: 12/18/2019 03:42       Procedures   ____________________________________________   INITIAL IMPRESSION / MDM / [place] / ED COURSE  As part of my medical decision making, I reviewed the following data within the [place] NUMBER  79 year old female presented with above-stated history and physical exam EKG revealing atrial fibrillation with rapid ventricular response.  Patient given diltiazem 5 mg IV x2.  Of note the patient took 2 oral doses of Cardizem before arrival to the emergency department.  Following the second dose of IV diltiazem patient currently normal sinus rhythm on cardiac monitor.  Patient however admits to continued feelings of labored breathing.  Laboratory data revealed a troponin of 11 and subsequently 16.  However given ongoing symptoms patient discussed with hospital staff for hospital admission for  observation.  ____________________________________________  FINAL CLINICAL IMPRESSION(S) / ED DIAGNOSES  Final diagnoses:  Atrial fibrillation with rapid ventricular response (HCC)  Chest pain with moderate risk for cardiac etiology     MEDICATIONS GIVEN DURING THIS VISIT:  Medications  diltiazem (CARDIZEM) injection 5 mg (5 mg Intravenous Given 12/18/19 0326)  diltiazem (CARDIZEM) injection 5 mg (5 mg Intravenous Given 12/18/19 0535)     ED Discharge Orders    None      *Please note:  Emilie Carp Horton was evaluated in Emergency Department on 12/18/2019 for the symptoms described in the history of present illness. She was evaluated in the context of the global COVID-19 pandemic, which necessitated consideration that the patient might be at risk for infection with the SARS-CoV-2 virus that causes COVID-19. Institutional protocols and algorithms that pertain to the evaluation of patients at risk for COVID-19 are in a state of rapid change based on information released by regulatory bodies including the CDC and federal and state organizations. These policies and algorithms were followed during the patient's care in the ED.  Some ED evaluations and interventions may be delayed as a result of limited staffing during and after the pandemic.*  Note:  This document was prepared using Dragon voice recognition software and may include unintentional dictation errors.   Gregor Hams, MD 12/23/19 [phone]

## 2019-12-21 ENCOUNTER — Telehealth: Payer: Self-pay | Admitting: Cardiovascular Disease

## 2019-12-21 NOTE — Telephone Encounter (Signed)
Patient has appointment with Truitt Merle NP next week.

## 2019-12-21 NOTE — Telephone Encounter (Signed)
Pt called and said that she was admitted in the hospital on 12/16/19 because of bad flair up of afib. Stayed in hospital overnight and the doctor told her to make an appt with cardiologist asap. Please call back

## 2019-12-21 NOTE — Telephone Encounter (Signed)
-----   Message from Rise Mu, PA-C sent at 12/18/2019  2:43 PM EDT ----- Can you please get her on the books to see Cecille Rubin or Dr. Johnsie Cancel in [place]? Thanks!

## 2019-12-22 NOTE — Telephone Encounter (Signed)
Hi Pam, this pt already have an appointment on 01-07-20 with Vin. He was the only one that had something available in person.

## 2019-12-22 NOTE — Progress Notes (Deleted)
CARDIOLOGY OFFICE NOTE  Date:  12/22/2019    Courtney Pena Date of Birth: 12/24/1940 Medical Record #[id number]  PCP:  [name], MD  Cardiologist:  Courtney Pena & ***    No chief complaint on file.   History of Present Illness: Courtney Pena is a 79 y.o. female who presents today for a ***  She has a history of HTN, DM, HLD,obesityandOSA. Long history of PAF. OnXarelto -held in past for gastric ectasia that was ablated. She has aCHADS2VASCof at [employer]. Intolerant to flecainidedue tovisual changes.She has seen Dr. Rayann Pena in the past - declined ablation but noted sotalol could be an optionfor her. History of mild CAD noted by remote cath in 2004. Last Myoview dates back to 2020. She has continued to struggle with weight loss. Has tended to stop her iron.   Saw Dr. Johnsie Pena in March. Felt to be doing ok. Had been placed on Ozempic and had lost weight.  I then saw her last week for a work in visit - several issues. Noted more spells of Af and using her prn CCB more. Seems to have some overall angst with getting older. Event monitor was obtained and this did not show AF.   Comes in today. Here alone. She is having some issues with gout in her fingers. She felts she may have had AF but this did not correlate with the monitor. Eliquis is pretty expensive - she has tried to get some assistance. Has not checked about formulary coverage. Breathing is good. No chest pain. Weight continues to go down.   Comes in today. Here with   Past Medical History:  Diagnosis Date  . Allergy   . Anemia due to GI blood loss 08/12/2011  . Arthritis   . Atrial flutter ([place]) 12-07-2010   converted in ED with 300 mg flecainide  . CAD (coronary artery disease)    a. mild per cath in 2004;  b. nonischemic Myoview in March 2012;  c. Lex MV 1/14:  EF 66%, no ischemia  . Cataract   . Chronic anticoagulation - Xarelto started 07/07/2015, CHADS2CVASC=5  07/07/2015  . Clotting disorder ([place])   . Diastolic CHF ([place]) 8/58/8502  . External hemorrhoids 06/07/2010  . Gastric antral vascular ectasia    source for gi bleed in 07/2011 - Xarelto stopped  . Gastritis   . GERD (gastroesophageal reflux disease)   . Hiatal hernia   . Hyperlipidemia   . Hypertension   . Hypothyroidism   . Neuromuscular disorder ([place])   . Obesity   . Paroxysmal atrial fibrillation (H[facility])   . Personal history of colonic polyps 06/06/2009   cecal polyp  . Sleep apnea    does not use CPAP  . Type 2 diabetes mellitus with diabetic chronic kidney disease ([place]) 04/25/2006      . Ulcer     Past Surgical History:  Procedure Laterality Date  . BREAST BIOPSY Left   . BREAST EXCISIONAL BIOPSY Left    benign  . CARDIAC CATHETERIZATION  1999&2004  . CARPAL TUNNEL RELEASE Bilateral 2003  . CATARACT EXTRACTION Bilateral   . COLONOSCOPY    . ESOPHAGOGASTRODUODENOSCOPY  08/13/2011   Procedure: ESOPHAGOGASTRODUODENOSCOPY (EGD);  Surgeon: Gatha Mayer, MD;  Location: [facility] ENDOSCOPY;  Service: Endoscopy;  Laterality: N/A;  . POLYPECTOMY       Medications: No outpatient medications have been marked as taking for the 12/30/19 encounter (Appointment) with Courtney Junes, NP.  Allergies: Allergies  Allergen Reactions  . Actos [Pioglitazone] Swelling    Swelling all over body and moonface  . Lisinopril Swelling    Swelling, may have had some breathing involvement. Angioedema  . Nsaids Other (See Comments)    Patient reports internal bleeding. Experienced with tolmetin.   Marland Kitchen Penicillins Shortness Of Breath and Swelling    Arm Swelling with Penicillin (Occurred in 1960s) Breathing - throat swelling with Amoxicillin (Occurred prior to 2002)  . Sulfamethoxazole Hives and Itching    "welps all over" immediately after dose  . Flecainide Other (See Comments)    Blurry  vision  . Hctz [Hydrochlorothiazide] Other (See Comments)    Gout  . Iodinated Diagnostic Agents  Itching    Pt. Developed mild itching after receiving IV cm; pt. Held; Dr. Keane Scrape recomended she take 50 mg of benadryl when she goes home-if necessary; Dr Mickey Farber recommends benadryl prior to future exams requiring contrast media, but stated other doctors may recommend another premedication prep.  Vania Rea [Empagliflozin] Other (See Comments)    Patient reported "it made her kidneys not work"  Patient resists retrial/alternative.  . Statins Other (See Comments)    Muscle aches with multiple statins. Able to tolerate rosuvastatin.  . Gabapentin Other (See Comments)    Caused dysphoria   . Losartan Potassium Other (See Comments)    Lower extremity swelling  . Zetia [Ezetimibe] Other (See Comments)    Cramps    Social History: The patient  reports that she quit smoking about 26 years ago. Her smoking use included cigarettes. She has a 60.00 pack-year smoking history. She has never used smokeless tobacco. She reports that she does not drink alcohol and does not use drugs.   Family History: The patient's ***family history includes Colon polyps in her daughter; Diabetes in her daughter and maternal grandfather; Heart disease in her father; Hypertension in her mother; Other in her mother; Thyroid disease in her daughter.   Review of Systems: Please see the history of present illness.   All other systems are reviewed and negative.   Physical Exam: VS:  There were no vitals taken for this visit. Marland Kitchen  BMI There is no height or weight on file to calculate BMI.  Wt Readings from Last 3 Encounters:  12/18/19 205 lb (93 kg)  11/26/19 209 lb 3.2 oz (94.9 kg)  11/26/19 209 lb 3.2 oz (94.9 kg)    General: Pleasant. Well developed, well nourished and in no acute distress.   HEENT: Normal.  Neck: Supple, no JVD, carotid bruits, or masses noted.  Cardiac: ***Regular rate and rhythm. No murmurs, rubs, or gallops. No edema.  Respiratory:  Lungs are clear to auscultation bilaterally with normal  work of breathing.  GI: Soft and nontender.  MS: No deformity or atrophy. Gait and ROM intact.  Skin: Warm and dry. Color is normal.  Neuro:  Strength and sensation are intact and no gross focal deficits noted.  Psych: Alert, appropriate and with normal affect.   LABORATORY DATA:  EKG:  EKG {ACTION; IS/IS CBJ:[id number]} ordered today.  Personally reviewed by me. This demonstrates ***.  Lab Results  Component Value Date   WBC 8.7 12/18/2019   HGB 10.3 (L) 12/18/2019   HCT 32.5 (L) 12/18/2019   PLT 223 12/18/2019   GLUCOSE 164 (H) 12/18/2019   CHOL 129 05/25/2019   TRIG 128 05/25/2019   HDL 50 05/25/2019   LDLDIRECT 62 11/24/2018   LDLCALC 56 05/25/2019   ALT 14 12/18/2019  AST 21 12/18/2019   NA 140 12/18/2019   K 4.3 12/18/2019   CL 106 12/18/2019   CREATININE 1.04 (H) 12/18/2019   BUN 23 12/18/2019   CO2 21 (L) 12/18/2019   TSH 5.192 (H) 12/18/2019   INR 1.06 08/14/2011   HGBA1C 6.5 09/14/2019     BNP (last 3 results) Recent Labs    12/18/19 0325  BNP 49.2    ProBNP (last 3 results) No results for input(s): PROBNP in the last 8760 hours.   Other Studies Reviewed Today:   Assessment/Plan: Event Monitor Study Highlights 09/2019  NSR No significant arrhythmias  MyoviewStudy Highlights2/2020    Nuclear stress EF: 49%.  No T wave inversion was noted during stress.  There was no ST segment deviation noted during stress.  The study is normal.  This is a low risk study.  Normal perfusion. LVEF 49% with very mild global hypokinesis. This is a low risk study. Consider echo correlation of LVEF.    Lower Extremity Arterial DopplerSummary2/2020: Right: Resting right ankle-brachial index is within normal range. No evidence of significant right lower extremity arterial disease. The right toe-brachial index is normal.  Left: Resting left ankle-brachial index is within normal range. No evidence of significant left lower extremity arterial  disease. The left toe-brachial index is mildly abnormal.   *See table(s) above for measurements and observations.  Electronically signed by Ena Dawley MD on 04/22/2018 at 1:54:12 PM.   EchoStudy Conclusions8/2019  - Left ventricle: The cavity size was mildly dilated. Wall thickness was normal. Systolic function was normal. The estimated ejection fraction was in the range of 55% to 60%. Wall motion was normal; there were no regional wall motion abnormalities. Doppler parameters are consistent with abnormal left ventricular relaxation (grade 1 diastolic dysfunction). Doppler parameters are consistent with high ventricular filling pressure. - Mitral valve: Calcified annulus. Mildly thickened leaflets .  Impressions:  - Normal LV systolic function; mild LVE; mild diastolic dysfunction.    Assessment/Plan:  1. Palpitations - her monitor looked good - she remains on her Eliquis as well. Has long and short acting CCB therapy.   2. Chronic anticoagulation - no problems  3. Prior mild hyperkalemia - will recheck today - she is trying to be more careful with her diet.   4. Chronic atypical chest pain - not really endorsed today. Myoview from 2020 was stable.   5. Chronic diastolic HF - BP is good - weight is down.  6. Obesity - continues to actively lose weight with Ozempic.   7. PAF - in sinus by exam. Stable monitor.   8. Chronic anticoagulation - no problems noted - she is going to check with her insurance company about formulary coverage.      Current medicines are reviewed with the patient today.  The patient does not have concerns regarding medicines other than what has been noted above.  The following changes have been made:  See above.  Labs/ tests ordered today include:   No orders of the defined types were placed in this encounter.    Disposition:   FU with *** in {gen number 3-08:657846} {Days to  years:10300}.   Patient is agreeable to this plan and will call if any problems develop in the interim.   SignedTruitt Merle, NP  12/22/2019 7:49 AM  [facility] [address] [place], [place]  [address] Phone: [phone] Fax: [phone]

## 2019-12-22 NOTE — Telephone Encounter (Signed)
Patient has appointment with A. FIB clinic 12/30/19.

## 2019-12-28 ENCOUNTER — Ambulatory Visit: Payer: Medicare Other | Admitting: Family Medicine

## 2019-12-28 ENCOUNTER — Encounter: Payer: Self-pay | Admitting: Family Medicine

## 2019-12-28 ENCOUNTER — Other Ambulatory Visit: Payer: Self-pay

## 2019-12-28 ENCOUNTER — Other Ambulatory Visit: Payer: Self-pay | Admitting: Family Medicine

## 2019-12-28 ENCOUNTER — Ambulatory Visit: Payer: Medicare Other | Admitting: Pharmacist

## 2019-12-28 VITALS — BP 148/82 | HR 71 | Ht 65.0 in | Wt 207.6 lb

## 2019-12-28 DIAGNOSIS — E1122 Type 2 diabetes mellitus with diabetic chronic kidney disease: Secondary | ICD-10-CM | POA: Diagnosis not present

## 2019-12-28 DIAGNOSIS — I480 Paroxysmal atrial fibrillation: Secondary | ICD-10-CM

## 2019-12-28 DIAGNOSIS — I10 Essential (primary) hypertension: Secondary | ICD-10-CM

## 2019-12-28 DIAGNOSIS — N1831 Chronic kidney disease, stage 3a: Secondary | ICD-10-CM | POA: Diagnosis not present

## 2019-12-28 DIAGNOSIS — N6320 Unspecified lump in the left breast, unspecified quadrant: Secondary | ICD-10-CM | POA: Insufficient documentation

## 2019-12-28 DIAGNOSIS — Z7901 Long term (current) use of anticoagulants: Secondary | ICD-10-CM | POA: Diagnosis not present

## 2019-12-28 DIAGNOSIS — M1A09X0 Idiopathic chronic gout, multiple sites, without tophus (tophi): Secondary | ICD-10-CM

## 2019-12-28 DIAGNOSIS — E7800 Pure hypercholesterolemia, unspecified: Secondary | ICD-10-CM

## 2019-12-28 LAB — POCT GLYCOSYLATED HEMOGLOBIN (HGB A1C): Hemoglobin A1C: 6.7 % — AB (ref 4.0–5.6)

## 2019-12-28 MED ORDER — ROSUVASTATIN CALCIUM 10 MG PO TABS: 10.0000 mg | ORAL_TABLET | Freq: Every day | ORAL | 3 refills | Status: DC

## 2019-12-28 MED ORDER — APIXABAN 5 MG PO TABS: 5.0000 mg | ORAL_TABLET | Freq: Two times a day (BID) | ORAL | 0 refills | Status: DC

## 2019-12-28 NOTE — Assessment & Plan Note (Signed)
Diabetes longstanding and currently improved since stopping  Her prednisone burst.  Patient is able to verbalize appropriate hypoglycemia management plan. Medication adherence appears good. -Continued GLP-1 Ozempic  (generic name semaglutide) at 1mg  weekly (patient using samples and taking 2 X 0.5mg  injections).   A1C in office 6.7 - Instructed to decrease  GLIPIZIDE from 10mg  XL BID to once daily.  Advised to STOP glipizide if multiple fasting readings continue to be seen that are < 100.  -Counseled on s/sx of and management of hypoglycemia -Next A1C anticipated 3-6 months.

## 2019-12-28 NOTE — Assessment & Plan Note (Signed)
I am delighted she is taking statin daily.

## 2019-12-28 NOTE — Assessment & Plan Note (Signed)
Even though she has had recent left breast WU, needs new diagnostic mammo for new lump.

## 2019-12-28 NOTE — Assessment & Plan Note (Signed)
Will get her back on her gout medications slowly.  They did not contribute to a fib

## 2019-12-28 NOTE — Assessment & Plan Note (Signed)
Well controled on current meds 

## 2019-12-28 NOTE — Progress Notes (Signed)
S:     Chief Complaint  Patient presents with  . Medication Management    Diabetes / AFib / Anticoag    Patient arrives in good spirits, ambulating without assistance.  Presents for diabetes evaluation, education, and management Patient was referred and last seen by Primary Care Provider, Dr. Andria Frames, this AM.    Insurance coverage/medication affordability: unchanged - remains in coverage gap.  Medication adherence reported as good - denies use of recently prescribed rheumatology medications.  Has restarted metoprolol succinate 100mg  BID and remains on diltiazem 360CD daily.  Patient reports symptoms of tachycardia with anxious feeling when she stopped her metoprolol due to lack of medication supply for several days.  She feels much improved since restarting her metoprolol 100mg  BID in combination with diltiazem 360 daily.   She reports adherence with Eliquis.    Current diabetes medications include: Ozempic 1mg  weekly, metformin XR 2000mg  daily and glipizide 10mg  BID.  Patient denies hypoglycemic events however reports multiple readings < 100 over the last 2 weeks.      O:  Physical Exam Constitutional:      Appearance: Normal appearance.  Pulmonary:     Effort: Pulmonary effort is normal.  Neurological:     Mental Status: She is alert.  Psychiatric:        Mood and Affect: Mood normal.        Thought Content: Thought content normal.      Review of Systems  Respiratory: Negative for hemoptysis.   Gastrointestinal: Negative for blood in stool.  All other systems reviewed and are negative.    Lab Results  Component Value Date   HGBA1C 6.5 09/14/2019   There were no vitals filed for this visit.  Lipid Panel     Component Value Date/Time   CHOL 129 05/25/2019 0954   TRIG 128 05/25/2019 0954   HDL 50 05/25/2019 0954   CHOLHDL 2.6 05/25/2019 0954   CHOLHDL 4.7 01/27/2015 1238   VLDL 28 01/27/2015 1238   LDLCALC 56 05/25/2019 0954   LDLDIRECT 62  11/24/2018 1211   LDLDIRECT 178 (H) 05/12/2015 1229    Home fasting blood sugars: several < 100 with majority of fasting readings low 100s     Clinical Atherosclerotic Cardiovascular Disease (ASCVD): Yes  The ASCVD Risk score Mikey Bussing DC Jr., et al., 2013) failed to calculate for the following reasons:   The valid total cholesterol range is 130 to 320 mg/dL    A/P: Diabetes longstanding and currently improved since stopping  Her prednisone burst.  Patient is able to verbalize appropriate hypoglycemia management plan. Medication adherence appears good. -Continued GLP-1 Ozempic  (generic name semaglutide) at 1mg  weekly (patient using samples and taking 2 X 0.5mg  injections).   A1C in office 6.7 - Instructed to decrease  GLIPIZIDE from 10mg  XL BID to once daily.  Advised to STOP glipizide if multiple fasting readings continue to be seen that are < 100.  -Counseled on s/sx of and management of hypoglycemia -Next A1C anticipated 3-6 months.  Anticoagulation - long-term use of Eliquis 5mg  BID for Atrial Fibrillation.  Samples provided.   Medication Samples have been provided to the patient.  Drug name: Ozempic          Qty: 2 pens  LOT: [id number]  Exp.Date: 02/25/2022  Dosing instructions: 1mg  (2 x 0.5mg ) weekly  The patient has been instructed regarding the correct time, dose, and frequency of taking this medication, including desired effects and most common side effects.  Courtney Pena 10:47 AM 12/28/2019   Written patient instructions provided.  Total time in face to face counseling 15 minutes.   Follow up Pharmacist/PCP Clinic Visit PRN.   Patient seen with Adria Dill, PharmD Candidate, and Dimple Nanas, PharmD - PGY-1 Resident.

## 2019-12-28 NOTE — Patient Instructions (Signed)
Decrease Glipizide from twice daily to once daily.

## 2019-12-28 NOTE — Progress Notes (Signed)
    SUBJECTIVE:   CHIEF COMPLAINT / HPI:   Several issues 1. DM.  Was doing well and weaned off glipizide.  Unfortunately, has needed a few courses of prednisone due to gouty arthritis.  Back on glipizide.  For A1C today.  Affording ozempic through drug program. 2. Left breast lump.  Found another lump at 12:00 in left breast.  This is different than the lump biopsied same breast  3. Due for COVID shot but will delay due to breast eval.  Also, brother-in-law dropped dead on the same day he got his COVID booster.  She understands that it is likely a coincidence 4. Bad spell of A fib with RVR.  She had been out of her metoprolol for 4-5 days due to mailing delay.  Back on metoprolol.   5. Seeing retinal specialist.  We do not have diabetic eye exam report. 6. Crestor.  She is taking daily and tolerating. 7. Arthritis followed by rheum.  She stopped several meds thinking they may have contributed to her a fib.      OBJECTIVE:   BP (!) 148/82   Pulse 71   Ht 5\' 5"  (1.651 m)   Wt 207 lb 9.6 oz (94.2 kg)   SpO2 94%   BMI 34.55 kg/m   Lungs clear C[place]di[place] RRR without m or g.   Left breast 1x1 cm fullness at 12:00  Ext trace edema.  ASSESSMENT/PLAN:   Type 2 diabetes mellitus with diabetic chronic kidney disease (HCC) Check A1C.  May be up due to pred  Paroxysmal atrial fibrillation ([place]) Bad spell with RVR but had been off metoprolol.  No change in meds  Left breast mass Even though she has had recent left breast WU, needs new diagnostic mammo for new lump.  HYPERCHOLESTEROLEMIA I am delighted she is taking statin daily.    Chronic gout of multiple sites Will get her back on her gout medications slowly.  They did not contribute to a fib  HYPERTENSION, BENIGN SYSTEMIC Well controled on current meds     Courtney Pena, [place]

## 2019-12-28 NOTE — Progress Notes (Signed)
Reviewed: I agree with Dr. Graylin Shiver documentatin and management.

## 2019-12-28 NOTE — Patient Instructions (Signed)
I am sorry about your brother-in-law. Hold off on the booster COVID until we get the breast studies done. Likely the lumps you are feeling are due to the weight loss and we always want to be sure.  I ordered a mammogram. No changes in your meds from my standpoint.  Make sure you take the metoprolol and diltiazem daily to protect against a fib spells.   Restarting medications in order at least one week apart. 1. Hydroxychlorquine. 2. Colchine 3. Allopurinol.

## 2019-12-28 NOTE — Assessment & Plan Note (Signed)
Bad spell with RVR but had been off metoprolol.  No change in meds

## 2019-12-28 NOTE — Assessment & Plan Note (Signed)
Check A1C.  May be up due to pred

## 2019-12-30 ENCOUNTER — Encounter (HOSPITAL_COMMUNITY): Payer: Self-pay | Admitting: Nurse Practitioner

## 2019-12-30 ENCOUNTER — Ambulatory Visit: Payer: Medicare Other | Admitting: Nurse Practitioner

## 2019-12-30 ENCOUNTER — Other Ambulatory Visit: Payer: Self-pay

## 2019-12-30 ENCOUNTER — Ambulatory Visit (HOSPITAL_COMMUNITY)
Admission: RE | Admit: 2019-12-30 | Discharge: 2019-12-30 | Disposition: A | Discharge status: Home / Self Care | Payer: Medicare Other | Source: Ambulatory Visit | Attending: Nurse Practitioner | Admitting: Nurse Practitioner

## 2019-12-30 VITALS — BP 148/80 | HR 69 | Ht 65.0 in | Wt 205.4 lb

## 2019-12-30 DIAGNOSIS — Z880 Allergy status to penicillin: Secondary | ICD-10-CM | POA: Diagnosis not present

## 2019-12-30 DIAGNOSIS — Z79899 Other long term (current) drug therapy: Secondary | ICD-10-CM | POA: Diagnosis not present

## 2019-12-30 DIAGNOSIS — E119 Type 2 diabetes mellitus without complications: Secondary | ICD-10-CM | POA: Insufficient documentation

## 2019-12-30 DIAGNOSIS — E785 Hyperlipidemia, unspecified: Secondary | ICD-10-CM | POA: Diagnosis not present

## 2019-12-30 DIAGNOSIS — I10 Essential (primary) hypertension: Secondary | ICD-10-CM | POA: Diagnosis not present

## 2019-12-30 DIAGNOSIS — Z7901 Long term (current) use of anticoagulants: Secondary | ICD-10-CM | POA: Diagnosis not present

## 2019-12-30 DIAGNOSIS — Z79890 Hormone replacement therapy (postmenopausal): Secondary | ICD-10-CM | POA: Insufficient documentation

## 2019-12-30 DIAGNOSIS — Z8249 Family history of ischemic heart disease and other diseases of the circulatory system: Secondary | ICD-10-CM | POA: Diagnosis not present

## 2019-12-30 DIAGNOSIS — Z888 Allergy status to other drugs, medicaments and biological substances status: Secondary | ICD-10-CM | POA: Diagnosis not present

## 2019-12-30 DIAGNOSIS — I480 Paroxysmal atrial fibrillation: Secondary | ICD-10-CM | POA: Insufficient documentation

## 2019-12-30 DIAGNOSIS — Z7984 Long term (current) use of oral hypoglycemic drugs: Secondary | ICD-10-CM | POA: Insufficient documentation

## 2019-12-30 DIAGNOSIS — D6869 Other thrombophilia: Secondary | ICD-10-CM

## 2019-12-30 DIAGNOSIS — Z87891 Personal history of nicotine dependence: Secondary | ICD-10-CM | POA: Insufficient documentation

## 2019-12-30 MED ORDER — DILTIAZEM HCL 30 MG PO TABS
30.0000 mg | ORAL_TABLET | ORAL | 6 refills | Status: DC | PRN
Start: 2019-12-30 — End: 2022-01-11

## 2019-12-30 NOTE — Progress Notes (Signed)
Patient ID: Courtney Pena, female   DOB: 14-Jul-1940, 79 y.o.   MRN: [id number]     Primary Care Physician: Courtney Resides, MD Referring Physician: Dr. Caryl Never Dianna [place] Silos Pena is a 79 y.o. 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 Other than a few intermittent flutters, she has not noticed any sustained afib. Tolerating flecainide well.Tolerating  Xarelto, no bleeding issue, but is in do-nut hole and will help fill out assistance forms today to see if she will qualify for assistance. She did have a f/u ETT, for which she did not stay on the treadmill for a minute but no EKG changes were noted with exertion.  Returns to afib clinic 8/17, for c/o blurred vision. She states that she has noted vision change since starting flecainide but thought it would wear off. She stopped flecainide yesterday and vision is already better. She is in SR today and since starting flecainide, afib burden has been low. She also stopped losartan because she retained fluid with previous use.   Her PCP wanted her to try one more time to see if the S.E. occurred again and it did. Will f/u with PCP for BP.  F/u in afib clinc 01/1116. She is off flecainide. She saw Dr. Rayann Pena in August and was given options for ablation or Rythmol. She decide that she would not make any changes. She feels that she has not noted the irregularity related with afib but has noted a different irregularity. She had pac's on her EKG the other day and what she describes fits this pattern.   She asked to be seen in afib clinic today with a few concerns. She describes seconds to minutes of a sensation that will start in her stomach  and spread to her chest, feels a flutter and a pin prick. This may happen 2x a day. Usually at rest.  She wanted to report these symptoms but does not feel they are bad enough to change her approach. She had a stress test in 2017 which was low risk.  F/u in afib clinic 06/1416 for increase in afib burden. She will note 2x a week and she will return to SR within one hour with taking 30 mg cardizem.. She had been on flecainide in the past but did not tolerate. She would like to discuss  with Dr. Rayann Pena pursuing ablation. She did see him last August but was not ready to commit at that time.   F/u afib clinic 10/15/16. She has the same afib pattern, around 1-2x a week with 30 mg cardizem getting her back to SR within around 30 mins. She did have an appointment with Dr. Rayann Pena re ablation, but since burden was low, it was more wait and see. She is still happy to take this approach.  F/u 02/27/17, she is in the clinic for f/u of event monitor placed by Courtney Pena after increasing metoprolol to 100 mg bid. Monitor did not show any afib or significant PC burden. She does not report having to take any extra Cardizem in a while. continues on xarelto without issues.  F/u in afib clinic, 12/30/19.I have not seen pt for a number of years, afib has been quiet,  but she recently has an ER visit for afib with RVR. She was started on Cardizem  drip and converted to SR. Per pt she had run out of metoprolol for a week prior to this occurring,waitijng for the drug to come by mail. She is now back on BB and is SR, no further afib.  She continues on eliquis for a CHA2DS2VASc of at least 6.   Today, she denies symptoms of palpitations, chest pain, shortness of breath, orthopnea, PND, lower extremity edema, dizziness, presyncope, syncope, or neurologic sequela. The patient is tolerating medications without difficulties and is otherwise without complaint today.   Past Medical History:  Diagnosis Date  . Allergy   . Anemia due to GI blood loss 08/12/2011  . Arthritis   . Atrial flutter ([place]) 12-07-2010   converted in ED with 300  mg flecainide  . CAD (coronary artery disease)    a. mild per cath in 2004;  b. nonischemic Myoview in March 2012;  c. Lex MV 1/14:  EF 66%, no ischemia  . Cataract   . Chronic anticoagulation - Xarelto started 07/07/2015, CHADS2CVASC=5 07/07/2015  . Clotting disorder ([place])   . Diastolic CHF ([place]) 2/48/2500  . External hemorrhoids 06/07/2010  . Gastric antral vascular ectasia    source for gi bleed in 07/2011 - Xarelto stopped  . Gastritis   . GERD (gastroesophageal reflux disease)   . Hiatal hernia   . Hyperlipidemia   . Hypertension   . Hypothyroidism   . Neuromuscular disorder ([place])   . Obesity   . Paroxysmal atrial fibrillation ([facility])   . Personal history of colonic polyps 06/06/2009   cecal polyp  . Sleep apnea    does not use CPAP  . Type 2 diabetes mellitus with diabetic chronic kidney disease ([place]) 04/25/2006      . Ulcer    Past Surgical History:  Procedure Laterality Date  . BREAST BIOPSY Left   . BREAST EXCISIONAL BIOPSY Left    benign  . CARDIAC CATHETERIZATION  1999&2004  . CARPAL TUNNEL RELEASE Bilateral 2003  . CATARACT EXTRACTION Bilateral   . COLONOSCOPY    . ESOPHAGOGASTRODUODENOSCOPY  08/13/2011   Procedure: ESOPHAGOGASTRODUODENOSCOPY (EGD);  Surgeon: Courtney Mayer, MD;  Location[facility] ENDOSCOPY;  Service: Endoscopy;  Laterality: N/A;  . POLYPECTOMY      Current Outpatient Medications  Medication Sig Dispense Refill  . acetaminophen (TYLENOL) 650 MG CR tablet Take 650 mg by mouth every 8 (eight) hours as needed for pain.    Marland Kitchen allopurinol (ZYLOPRIM) 100 MG tablet Take 1 tablet (100 mg total) by mouth daily. 30 tablet 6  . apixaban (ELIQUIS) 5 MG TABS tablet Take 1 tablet (5 mg total) by mouth 2 (two) times daily. 112 tablet 0  . Apoaequorin (PREVAGEN PO) Take 1 tablet by mouth.    . blood glucose meter kit and supplies Dispense based on patient and insurance preference. Use up to four times daily as directed. (FOR ICD-10 E10.9, E11.9). 1 each 0  . cetirizine  (ZYRTEC) 10 MG tablet Take 10 mg by mouth as needed for allergies. Takes 10 mg as needed for sinus trouble    . Cholecalciferol (VITAMIN D3) 2000 units TABS Take 2 capsules by mouth daily.     . Coenzyme Q10 (COQ10) 100 MG CAPS Take 100 mEq by mouth daily.     . colchicine 0.6 MG tablet TAKE 1 TABLET BY MOUTH EVERY DAY (Patient taking differently: as needed. Her doctor wants her to start taking this medication daily) 90 tablet 3  . diclofenac sodium (VOLTAREN) 1 % GEL Apply 2 g  topically 4 (four) times daily. (Patient taking differently: Apply 2 g topically as needed. ) 100 g prn  . diltiazem (CARDIZEM CD) 360 MG 24 hr capsule TAKE 1 CAPSULE BY MOUTH  DAILY 90 capsule 3  . diltiazem (CARDIZEM) 30 MG tablet Take 1 tablet (30 mg total) by mouth every 4 (four) hours as needed (elevated HR as long as BP > 100). 30 tablet 6  . ferrous sulfate 325 (65 FE) MG EC tablet Take 650 mg by mouth daily.     Marland Kitchen glipiZIDE (GLUCOTROL XL) 10 MG 24 hr tablet Take 10 mg by mouth 2 (two) times daily.    Marland Kitchen glucose blood (ONETOUCH ULTRA) test strip 1 Container by Other route every morning.    Marland Kitchen glucose blood test strip Use to test blood sugar 3 times daily. 300 each 3  . Insulin Pen Needle 31G X 8 MM MISC Use with pen to inject once daily. 100 each 3  . Lancets (ONETOUCH ULTRASOFT) lancets Use to check blood sugar daily. 100 each 3  . levothyroxine (SYNTHROID) 112 MCG tablet TAKE 1 TABLET BY MOUTH  DAILY 90 tablet 3  . metFORMIN (GLUCOPHAGE-XR) 500 MG 24 hr tablet Take 4 tablets (2,000 mg total) by mouth daily. 360 tablet 3  . metoprolol succinate (TOPROL-XL) 100 MG 24 hr tablet TAKE 1 TABLET BY MOUTH 2  TIMES DAILY. TAKE WITH OR  IMMEDIATELY FOLLOWING A  MEAL. 180 tablet 1  . Multiple Vitamin (MULTI-VITAMINS) TABS Take daily by mouth.    . nitroGLYCERIN (NITROSTAT) 0.4 MG SL tablet Place 1 tablet (0.4 mg total) under the tongue every 5 (five) minutes as needed for chest pain (Call 911 if chest pain after three doses). 25  tablet 3  . omeprazole (PRILOSEC) 40 MG capsule TAKE 1 CAPSULE BY MOUTH  DAILY 90 capsule 0  . rosuvastatin (CRESTOR) 10 MG tablet Take 1 tablet (10 mg total) by mouth daily. 90 tablet 3  . Semaglutide, 1 MG/DOSE, 2 MG/1.5ML SOPN Inject 1 mg into the skin once a week. 4 pen 0  . tobramycin (TOBREX) 0.3 % ophthalmic solution Place 1 drop into both eyes as directed.    . triamcinolone cream (KENALOG) 0.1 % Apply 1 application topically as needed (dermatitis).    . hydroxychloroquine (PLAQUENIL) 200 MG tablet Take 200 mg by mouth 2 (two) times daily. (Patient not taking: Reported on 12/18/2019)     No current facility-administered medications for this encounter.    Allergies  Allergen Reactions  . Actos [Pioglitazone] Swelling    Swelling all over body and moonface  . Lisinopril Swelling    Swelling, may have had some breathing involvement. Angioedema  . Nsaids Other (See Comments)    Patient reports internal bleeding. Experienced with tolmetin.   Marland Kitchen Penicillins Shortness Of Breath and Swelling    Arm Swelling with Penicillin (Occurred in 1960s) Breathing - throat swelling with Amoxicillin (Occurred prior to 2002)  . Sulfamethoxazole Hives and Itching    "welps all over" immediately after dose  . Flecainide Other (See Comments)    Blurry  vision  . Hctz [Hydrochlorothiazide] Other (See Comments)    Gout  . Iodinated Diagnostic Agents Itching    Pt. Developed mild itching after receiving IV cm; pt. Held; Dr. Keane Scrape recomended she take 50 mg of benadryl when she goes home-if necessary; Dr Mickey Farber recommends benadryl prior to future exams requiring contrast media, but stated other doctors may recommend another premedication prep.  Vania Rea [Empagliflozin] Other (See Comments)  Patient reported "it made her kidneys not work"  Patient resists retrial/alternative.  . Statins Other (See Comments)    Muscle aches with multiple statins. Able to tolerate rosuvastatin.  . Gabapentin Other  (See Comments)    Caused dysphoria   . Losartan Potassium Other (See Comments)    Lower extremity swelling  . Zetia [Ezetimibe] Other (See Comments)    Cramps    Social History   Socioeconomic History  . Marital status: Widowed    Spouse name: Iona Beard  . Number of children: 4  . Years of education: some colle  . Highest education level: Not on file  Occupational History  . Occupation: Management    Employer: UNEMPLOYED  Tobacco Use  . Smoking status: Former Smoker    Packs/day: 2.00    Years: 30.00    Pack years: 60.00    Types: Cigarettes    Quit date: 02/26/1993    Years since quitting: 26.8  . Smokeless tobacco: Never Used  Vaping Use  . Vaping Use: Never used  Substance and Sexual Activity  . Alcohol use: No    Alcohol/week: 0.0 standard drinks  . Drug use: No  . Sexual activity: Not Currently  Other Topics Concern  . Not on file  Social History Narrative  . Not on file   Social Determinants of Health   Financial Resource Strain:   . Difficulty of Paying Living Expenses: Not on file  Food Insecurity:   . Worried About Charity fundraiser in the Last Year: Not on file  . Ran Out of Food in the Last Year: Not on file  Transportation Needs:   . Lack of Transportation (Medical): Not on file  . Lack of Transportation (Non-Medical): Not on file  Physical Activity:   . Days of Exercise per Week: Not on file  . Minutes of Exercise per Session: Not on file  Stress:   . Feeling of Stress : Not on file  Social Connections:   . Frequency of Communication with Friends and Family: Not on file  . Frequency of Social Gatherings with Friends and Family: Not on file  . Attends Religious Services: Not on file  . Active Member of Clubs or Organizations: Not on file  . Attends Archivist Meetings: Not on file  . Marital Status: Not on file  Intimate Partner Violence:   . Fear of Current or Ex-Partner: Not on file  . Emotionally Abused: Not on file  . Physically  Abused: Not on file  . Sexually Abused: Not on file    Family History  Problem Relation Age of Onset  . Heart disease Father   . Diabetes Maternal Grandfather   . Hypertension Mother   . Other Mother        brain tumor-benign  . Diabetes Daughter        pre-diabeties  . Colon polyps Daughter   . Thyroid disease Daughter        x 2  . Colon cancer Neg Hx   . Esophageal cancer Neg Hx   . Stomach cancer Neg Hx   . Rectal cancer Neg Hx     ROS- All systems are reviewed and negative except as per the HPI above  Physical Exam: Vitals:   12/30/19 1146  Weight: 93.2 kg  Height: '5\' 5"'  (1.651 m)    GEN- The patient is well appearing, alert and oriented x 3 today.   Head- normocephalic, atraumatic Eyes-  Sclera clear, conjunctiva pink Ears-  hearing intact Oropharynx- clear Neck- supple, no JVP Lymph- no cervical lymphadenopathy Lungs- Clear to ausculation bilaterally, normal work of breathing Heart- Regular rate and rhythm, no murmurs, rubs or gallops, PMI not laterally displaced GI- soft, NT, ND, + BS Extremities- no clubbing, cyanosis, or edema MS- no significant deformity or atrophy Skin- no rash or lesion Psych- euthymic mood, full affect Neuro- strength and sensation are intact  EKG-NSR at 69 bpm, Pr int 136 ms, QRS int 88 ms, qtc 437 ms   Assessment and Plan: 1. Symptomatic paroxysmal AFIb Afib burden has been  low and trigger was likely being without BB for one week Educated pt how that drug helps to [employer] She felt it was only a BP pill  Continue eliquis 5 mg bid for chadsvasc score of at least 6  Continue dilt/BB at current doses For  breakthrough afib, cardizem 30 mg as needed was rx'ed  2. HTN Stable   3. DM Pt is working on better glycemic control   F/u with [place], PA 11/11 as scheduled  afib clinic as needed  Butch Penny C. Jodie Cavey, [facility] [address] [place], [place] [address] [phone]

## 2020-01-06 NOTE — Progress Notes (Signed)
Cardiology Office Note  Date:  01/06/2020   ID:  Elveria Royals Maud Deed, DOB [birth year], MRN [id number]  PCP:  [name], MD  Cardiologist:  Dr. Johnsie Cancel  _____________  ER follow-up for Afib  _____________   History of Present Illness: Courtney Pena is a 79 y.o. female with pmh of mild CAD on cath in 2004, HTN, DM, HLD, obesity, OSA, PAF on Eliquis, CHADSVASC of at least 5, intolerant to flecainide due to visual changes . She has seen Dr. Rayann Heman in the past and declined ablation but noted sotalol was an option. Also follows with the Afib clinic. Myoview in 03/2018 showed no significant ischemia with an LVEF 49% and was overall al ow risk scan. Seen in 09/2019 and felt to have more AF and using CCB more. Event monitor was obtained which did not show AF.   Patient was seen in the ER 12/18/19 for Afib RVR. She was started on IV cardizem and self converted. Patient had run out of metoprolol. She was seen in the Afib clinic 12/30/19 and was in SR back on her BB.   Today, she says overall she is doing okay. Has rare brief palpitations at times. She feels her watch is pretty accurate at telling her when she is in afib. She has been taking her BB. She did not realize how important this was to control afib rates. She also reports chest discomfort almost daily. This has been ongoing for the last 3-4 weeks. It is substernal. No radiation. Its a pressure. Not worse on exertion. It can occur at any time. Will last an hour. Gets up to 3/10. Has not taken SL Nitro for it. She is not sure if the pain s related to when she is in Afib. She says that over the last week the chest pain has greatly improved. She denies symptoms of shortness of breath, orthopnea, PND, lower extremity edema, claudication, dizziness, presyncope, syncope, bleeding, or neurologic sequela.  _____________   Past Medical History:  Diagnosis Date  . Allergy   . Anemia due to GI blood loss 08/12/2011  .  Arthritis   . Atrial flutter ([place]) 12-07-2010   converted in ED with 300 mg flecainide  . CAD (coronary artery disease)    a. mild per cath in 2004;  b. nonischemic Myoview in March 2012;  c. Lex MV 1/14:  EF 66%, no ischemia  . Cataract   . Chronic anticoagulation - Xarelto started 07/07/2015, CHADS2CVASC=5 07/07/2015  . Clotting disorder ([place])   . Diastolic CHF ([place]) 5/73/2202  . External hemorrhoids 06/07/2010  . Gastric antral vascular ectasia    source for gi bleed in 07/2011 - Xarelto stopped  . Gastritis   . GERD (gastroesophageal reflux disease)   . Hiatal hernia   . Hyperlipidemia   . Hypertension   . Hypothyroidism   . Neuromuscular disorder ([place])   . Obesity   . Paroxysmal atrial fibrillation ([facility])   . Personal history of colonic polyps 06/06/2009   cecal polyp  . Sleep apnea    does not use CPAP  . 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 Ulcer    Past Surgical History:  Procedure Laterality Date  . BREAST BIOPSY Left   . BREAST EXCISIONAL BIOPSY Left    benign  . CARDIAC CATHETERIZATION  1999&2004  . CARPAL TUNNEL RELEASE Bilateral 2003  . CATARACT EXTRACTION Bilateral   . COLONOSCOPY    .  ESOPHAGOGASTRODUODENOSCOPY  08/13/2011   Procedure: ESOPHAGOGASTRODUODENOSCOPY (EGD);  Surgeon: Gatha Mayer, MD;  Location: [facility] ENDOSCOPY;  Service: Endoscopy;  Laterality: N/A;  . POLYPECTOMY     _____________  Current Outpatient Medications  Medication Sig Dispense Refill  . acetaminophen (TYLENOL) 650 MG CR tablet Take 650 mg by mouth every 8 (eight) hours as needed for pain.    Marland Kitchen allopurinol (ZYLOPRIM) 100 MG tablet Take 1 tablet (100 mg total) by mouth daily. 30 tablet 6  . apixaban (ELIQUIS) 5 MG TABS tablet Take 1 tablet (5 mg total) by mouth 2 (two) times daily. 112 tablet 0  . Apoaequorin (PREVAGEN PO) Take 1 tablet by mouth.    . blood glucose meter kit and supplies Dispense based on patient and insurance preference.  Use up to four times daily as directed. (FOR ICD-10 E10.9, E11.9). 1 each 0  . cetirizine (ZYRTEC) 10 MG tablet Take 10 mg by mouth as needed for allergies. Takes 10 mg as needed for sinus trouble    . Cholecalciferol (VITAMIN D3) 2000 units TABS Take 2 capsules by mouth daily.     . Coenzyme Q10 (COQ10) 100 MG CAPS Take 100 mEq by mouth daily.     . colchicine 0.6 MG tablet TAKE 1 TABLET BY MOUTH EVERY DAY (Patient taking differently: as needed. Her doctor wants her to start taking this medication daily) 90 tablet 3  . diclofenac sodium (VOLTAREN) 1 % GEL Apply 2 g topically 4 (four) times daily. (Patient taking differently: Apply 2 g topically as needed. ) 100 g prn  . diltiazem (CARDIZEM CD) 360 MG 24 hr capsule TAKE 1 CAPSULE BY MOUTH  DAILY 90 capsule 3  . diltiazem (CARDIZEM) 30 MG tablet Take 1 tablet (30 mg total) by mouth every 4 (four) hours as needed (elevated HR as long as BP > 100). 30 tablet 6  . ferrous sulfate 325 (65 FE) MG EC tablet Take 650 mg by mouth daily.     Marland Kitchen glipiZIDE (GLUCOTROL XL) 10 MG 24 hr tablet Take 10 mg by mouth 2 (two) times daily.    Marland Kitchen glucose blood (ONETOUCH ULTRA) test strip 1 Container by Other route every morning.    Marland Kitchen glucose blood test strip Use to test blood sugar 3 times daily. 300 each 3  . hydroxychloroquine (PLAQUENIL) 200 MG tablet Take 200 mg by mouth 2 (two) times daily. (Patient not taking: Reported on 12/18/2019)    . Insulin Pen Needle 31G X 8 MM MISC Use with pen to inject once daily. 100 each 3  . Lancets (ONETOUCH ULTRASOFT) lancets Use to check blood sugar daily. 100 each 3  . levothyroxine (SYNTHROID) 112 MCG tablet TAKE 1 TABLET BY MOUTH  DAILY 90 tablet 3  . metFORMIN (GLUCOPHAGE-XR) 500 MG 24 hr tablet Take 4 tablets (2,000 mg total) by mouth daily. 360 tablet 3  . metoprolol succinate (TOPROL-XL) 100 MG 24 hr tablet TAKE 1 TABLET BY MOUTH 2  TIMES DAILY. TAKE WITH OR  IMMEDIATELY FOLLOWING A  MEAL. 180 tablet 1  . Multiple Vitamin  (MULTI-VITAMINS) TABS Take daily by mouth.    . nitroGLYCERIN (NITROSTAT) 0.4 MG SL tablet Place 1 tablet (0.4 mg total) under the tongue every 5 (five) minutes as needed for chest pain (Call 911 if chest pain after three doses). 25 tablet 3  . omeprazole (PRILOSEC) 40 MG capsule TAKE 1 CAPSULE BY MOUTH  DAILY 90 capsule 0  . rosuvastatin (CRESTOR) 10 MG tablet Take 1 tablet (  10 mg total) by mouth daily. 90 tablet 3  . Semaglutide, 1 MG/DOSE, 2 MG/1.5ML SOPN Inject 1 mg into the skin once a week. 4 pen 0  . tobramycin (TOBREX) 0.3 % ophthalmic solution Place 1 drop into both eyes as directed.    . triamcinolone cream (KENALOG) 0.1 % Apply 1 application topically as needed (dermatitis).     No current facility-administered medications for this visit.   _____________   Allergies:   Actos [pioglitazone], Lisinopril, Nsaids, Penicillins, Sulfamethoxazole, Flecainide, Hctz [hydrochlorothiazide], Iodinated diagnostic agents, Jardiance [empagliflozin], Statins, Gabapentin, Losartan potassium, and Zetia [ezetimibe]  _____________   Social History:  The patient  reports that she quit smoking about 26 years ago. Her smoking use included cigarettes. She has a 60.00 pack-year smoking history. She has never used smokeless tobacco. She reports that she does not drink alcohol and does not use drugs.  _____________   Family History:  The patient's family history includes Colon polyps in her daughter; Diabetes in her daughter and maternal grandfather; Heart disease in her father; Hypertension in her mother; Other in her mother; Thyroid disease in her daughter.  _____________   ROS:  Please see the history of present illness.   Positive for improving chest discomfort and occasional palpitations,   All other systems are reviewed and negative.  _____________   PHYSICAL EXAM: VS:  There were no vitals taken for this visit. , BMI There is no height or weight on file to calculate BMI. GEN: Well nourished, well  developed, in no acute distress  HEENT: normal  Neck: no JVD, carotid bruits, or masses Cardiac: RRR; no murmurs, rubs, or gallops. No clubbing, cyanosis, edema.  Radials/DP/PT 2+ and equal bilaterally.  Respiratory:  clear to auscultation bilaterally, normal work of breathing GI: soft, nontender, nondistended, + BS MS: no deformity or atrophy  Skin: warm and dry, no rash Neuro:  Strength and sensation are intact Psych: euthymic mood, full affect _____________  EKG:   The ekg ordered today shows NSR, 73 bpm, first degree AV block, q wavew anterior leads  Recent Labs: 12/18/2019: ALT 14; B Natriuretic Peptide 49.2; BUN 23; Creatinine, Ser 1.04; Hemoglobin 10.3; Platelets 223; Potassium 4.3; Sodium 140; TSH 5.192  05/25/2019: Chol/HDL Ratio 2.6; Cholesterol, Total 129; HDL 50; LDL Chol Calc (NIH) 56; Triglycerides 128  Estimated Creatinine Clearance: 49.5 mL/min (A) (by C-G formula based on SCr of 1.04 mg/dL (H)).  Wt Readings from Last 3 Encounters:  12/30/19 205 lb 6.4 oz (93.2 kg)  12/28/19 207 lb 9.6 oz (94.2 kg)  12/18/19 205 lb (93 kg)    Echo 12/18/2019 1. Left ventricular ejection fraction, by estimation, is 65 to 70%. The  left ventricle has normal function. The left ventricle has no regional  wall motion abnormalities. There is mild left ventricular hypertrophy.  Left ventricular diastolic parameters  are indeterminate. Elevated left atrial pressure.  2. Right ventricular systolic function is normal. The right ventricular  size is normal.  3. A small pericardial effusion is present. There is no evidence of  cardiac tamponade.  4. The mitral valve is degenerative. Trivial mitral valve regurgitation.  No evidence of mitral stenosis.  5. The aortic valve has an indeterminant number of cusps. Aortic valve  regurgitation is not visualized. No aortic stenosis is present.  6. The inferior vena cava is normal in size with greater than 50%  respiratory variability,  suggesting right atrial pressure of 3 mmHg.   Cardiac telemetry 09/2019 NSR No significant arrhythmias  Myoview stress test 09/2018   Nuclear stress EF: 49%.  No T wave inversion was noted during stress.  There was no ST segment deviation noted during stress.  The study is normal.  This is a low risk study.     Normal perfusion. LVEF 49% with very mild global hypokinesis. This is a low risk study. Consider echo correlation of LVEF.   _____________   ASSESSMENT AND PLAN:  Paroxysmal Afib Recently seen in the ER for Afib RVR and self converted with IV Cardizem. She ran out of BB. Seen in the Afib clinic and in [place]. Continue Eliquis for CHADSVASC of 6. In SR today. Says she has occasional palpitations and can tell when she is in Afiib.   Atypical chest pain In the ER patient had chest pain in the setting of Afib RVR. HS tropon negative with flat trend in the ER. Since converting to NSR and starting her BB she feels chest pain has improved. States prior to ER visit she was having the chest pain daily. Overall sounds pretty atypical. Could be related to her Afib however patient is unsure of this. She has history of mild CAD byu cath in 2004. Myoview in 03/2018 showed no significant ischemia with an LVEF 49% and was overall al ow risk scan. She is already on high dose Toprol and diltiazem. EKG today with no ischemic changes. I recommended the next time she feels the chest pain to take a SL Nitro next time and monitor response. Also keep a log of chest pain to compare if it is related to aafib   Pericardial effusion Small effusion noted on CT in the ER. Echo showed small pericardial effusion with no cardiac tamponade. Vitals stable today. No further work-up.   HFpEF Echo in the ER showed LVEF 65-70%, no WMA, mild LVH, trivial MR. She is euvolemic on exam.   Hypertension BP today 130/80. Continue Losartan HCTZ , diltiazem    Disposition:   FU with APP in 1  month   Signed, Braylea Brancato Ninfa Meeker, PA-C 01/06/2020 2:27 PM    _____________ [place] [address]  [phone] (office) [phone] (fax)

## 2020-01-07 ENCOUNTER — Other Ambulatory Visit: Payer: Self-pay

## 2020-01-07 ENCOUNTER — Ambulatory Visit: Payer: Medicare Other | Admitting: Medical

## 2020-01-07 ENCOUNTER — Encounter: Payer: Self-pay | Admitting: Medical

## 2020-01-07 VITALS — BP 130/80 | HR 76 | Ht 65.0 in | Wt 206.0 lb

## 2020-01-07 DIAGNOSIS — R079 Chest pain, unspecified: Secondary | ICD-10-CM

## 2020-01-07 DIAGNOSIS — I10 Essential (primary) hypertension: Secondary | ICD-10-CM | POA: Diagnosis not present

## 2020-01-07 DIAGNOSIS — I5032 Chronic diastolic (congestive) heart failure: Secondary | ICD-10-CM | POA: Diagnosis not present

## 2020-01-07 DIAGNOSIS — I480 Paroxysmal atrial fibrillation: Secondary | ICD-10-CM

## 2020-01-07 NOTE — Patient Instructions (Addendum)
Medication Instructions:  Your physician recommends that you continue on your current medications as directed. Please refer to the Current Medication list given to you today.   *If you need a refill on your cardiac medications before your next appointment, please call your pharmacy*   Lab Work: None ordered  If you have labs (blood work) drawn today and your tests are completely normal, you will receive your results only by: Marland Kitchen MyChart Message (if you have MyChart) OR . A paper copy in the mail If you have any lab test that is abnormal or we need to change your treatment, we will call you to review the results.   Testing/Procedures: None ordered   Follow-Up: At [facility], you and your health needs are our priority.  As part of our continuing mission to provide you with exceptional heart care, we have created designated Provider Care Teams.  These Care Teams include your primary Cardiologist (physician) and Advanced Practice Providers (APPs -  Physician Assistants and Nurse Practitioners) who all work together to provide you with the care you need, when you need it.  We recommend signing up for the patient portal called "MyChart".  Sign up information is provided on this After Visit Summary.  MyChart is used to connect with patients for Virtual Visits (Telemedicine).  Patients are able to view lab/test results, encounter notes, upcoming appointments, etc.  Non-urgent messages can be sent to your provider as well.   To learn more about what you can do with MyChart, go to [email or web address].    Your next appointment:   1 month(s)   02/02/2020 arrive at 10:30 to see Courtney Merle, NP on a day Dr. Johnsie Cancel is in office.   The format for your next appointment:   In Person  Provider:   You may see [name], MD or one of the following Advanced Practice Providers on your designated Care Team:    Courtney Merle, NP  Cecilie Kicks, NP  Kathyrn Drown, NP    Other  Instructions Keep a log of the chest pain and see if it's related as to when you go in and out of afib.  Take a nitroglycerin if you have to

## 2020-01-18 ENCOUNTER — Other Ambulatory Visit: Payer: Self-pay | Admitting: Family Medicine

## 2020-01-18 DIAGNOSIS — E7800 Pure hypercholesterolemia, unspecified: Secondary | ICD-10-CM

## 2020-01-19 NOTE — Progress Notes (Signed)
CARDIOLOGY OFFICE NOTE  Date:  02/02/2020    Courtney Pena Date of Birth: 02/02/1941 Medical Record #[id number]  PCP:  [name], MD  Cardiologist:  Johnsie Cancel - also seen in AF clinic  Chief Complaint  Patient presents with  . Follow-up    Seen for Dr. Johnsie Cancel    History of Present Illness: Courtney Pena is a 79 y.o. female who presents today for a follow up visit. Seen for Dr. Johnsie Cancel.   She has a history of known mild CAD on cath in 2004, HTN, DM, HLD, obesity, OSA, PAF on Eliquis, CHADSVASC of at least 5, intolerant to Flecainide due to visual changes . She has seen Dr. Rayann Heman in the past and declined ablation but noted sotalol was an option. Also follows with the Afib clinic. Myoview in 03/2018 showed no significant ischemia with an LVEF 49% and was overall al ow risk scan. Seen in 09/2019 and felt to have more AF and using CCB more. Event monitor was obtained which did not show AF.   She was seen in the ER 12/18/19 for Afib RVR. She was started on IV Cardizem and self converted. She had run out of her metoprolol thru her mail order. She was seen in the Afib clinic 12/30/19 and was in [place] and was back on her BB.   She was seen by [place] last month - doing ok. Taking her Metoprolol - apparently did not realize how important this was to her care and overall management. Chronic chest pain which had improved.   Comes in today. Here alone. Doing ok. Her mail order for her Metoprolol was over a week late - may have led to this recent episode. Has had "2 little episodes" of palpitations. She had 2 spells of tightness associated with this - she used NTG with no response. She used short acting CCB with complete relief. Otherwise, feels much better. Taking medicines as recommended. Weight down a few pounds. Not dizzy. No real concerns otherwise.   Past Medical History:  Diagnosis Date  . Allergy   . Anemia due to GI blood loss 08/12/2011  . Arthritis    . Atrial flutter ([place]) 12-07-2010   converted in ED with 300 mg flecainide  . CAD (coronary artery disease)    a. mild per cath in 2004;  b. nonischemic Myoview in March 2012;  c. Lex MV 1/14:  EF 66%, no ischemia  . Cataract   . Chronic anticoagulation - Xarelto started 07/07/2015, CHADS2CVASC=5 07/07/2015  . Clotting disorder ([place])   . Diastolic CHF ([place]) 03/29/69  . External hemorrhoids 06/07/2010  . Gastric antral vascular ectasia    source for gi bleed in 07/2011 - Xarelto stopped  . Gastritis   . GERD (gastroesophageal reflux disease)   . Hiatal hernia   . Hyperlipidemia   . Hypertension   . Hypothyroidism   . Neuromuscular disorder ([place])   . Obesity   . Paroxysmal atrial fibrillation ([facility])   . Personal history of colonic polyps 06/06/2009   cecal polyp  . Sleep apnea    does not use CPAP  . Type 2 diabetes mellitus with diabetic chronic kidney disease ([place]) 04/25/2006      . Ulcer     Past Surgical History:  Procedure Laterality Date  . BREAST BIOPSY Left   . BREAST EXCISIONAL BIOPSY Left    benign  . CARDIAC CATHETERIZATION  1999&2004  . CARPAL TUNNEL RELEASE Bilateral 2003  .  CATARACT EXTRACTION Bilateral   . COLONOSCOPY    . ESOPHAGOGASTRODUODENOSCOPY  08/13/2011   Procedure: ESOPHAGOGASTRODUODENOSCOPY (EGD);  Surgeon: Gatha Mayer, MD;  Location[facility] ENDOSCOPY;  Service: Endoscopy;  Laterality: N/A;  . POLYPECTOMY       Medications: Current Meds  Medication Sig  . acetaminophen (TYLENOL) 650 MG CR tablet Take 650 mg by mouth every 8 (eight) hours as needed for pain.  Marland Kitchen allopurinol (ZYLOPRIM) 100 MG tablet Take 1 tablet (100 mg total) by mouth daily.  Marland Kitchen apixaban (ELIQUIS) 5 MG TABS tablet Take 1 tablet (5 mg total) by mouth 2 (two) times daily.  Marland Kitchen Apoaequorin (PREVAGEN PO) Take 1 tablet by mouth.  . blood glucose meter kit and supplies Dispense based on patient and insurance preference. Use up to four times daily as directed. (FOR ICD-10 E10.9, E11.9).  .  cetirizine (ZYRTEC) 10 MG tablet Take 10 mg by mouth as needed for allergies. Takes 10 mg as needed for sinus trouble  . Cholecalciferol (VITAMIN D3) 2000 units TABS Take 2 capsules by mouth daily.   . Coenzyme Q10 (COQ10) 100 MG CAPS Take 100 mEq by mouth daily.   . colchicine 0.6 MG tablet TAKE 1 TABLET BY MOUTH EVERY DAY  . diclofenac Sodium (VOLTAREN) 1 % GEL Apply topically as needed (pain).  Marland Kitchen diltiazem (CARDIZEM CD) 360 MG 24 hr capsule TAKE 1 CAPSULE BY MOUTH  DAILY  . diltiazem (CARDIZEM) 30 MG tablet Take 1 tablet (30 mg total) by mouth every 4 (four) hours as needed (elevated HR as long as BP > 100).  . ferrous sulfate 325 (65 FE) MG EC tablet Take 650 mg by mouth daily.   Marland Kitchen glipiZIDE (GLUCOTROL XL) 10 MG 24 hr tablet Take 10 mg by mouth 2 (two) times daily.  Marland Kitchen glucose blood (ONETOUCH ULTRA) test strip 1 Container by Other route every morning.  Marland Kitchen glucose blood test strip Use to test blood sugar 3 times daily.  . hydroxychloroquine (PLAQUENIL) 200 MG tablet Take 200 mg by mouth 2 (two) times daily.   . Insulin Pen Needle 31G X 8 MM MISC Use with pen to inject once daily.  . Lancets (ONETOUCH ULTRASOFT) lancets Use to check blood sugar daily.  Marland Kitchen levothyroxine (SYNTHROID) 112 MCG tablet TAKE 1 TABLET BY MOUTH  DAILY  . metFORMIN (GLUCOPHAGE-XR) 500 MG 24 hr tablet Take 4 tablets (2,000 mg total) by mouth daily.  . metoprolol succinate (TOPROL-XL) 100 MG 24 hr tablet TAKE 1 TABLET BY MOUTH 2  TIMES DAILY. TAKE WITH OR  IMMEDIATELY FOLLOWING A  MEAL.  . Multiple Vitamin (MULTI-VITAMINS) TABS Take daily by mouth.  . nitroGLYCERIN (NITROSTAT) 0.4 MG SL tablet Place 1 tablet (0.4 mg total) under the tongue every 5 (five) minutes as needed for chest pain (Call 911 if chest pain after three doses).  Marland Kitchen omeprazole (PRILOSEC) 40 MG capsule TAKE 1 CAPSULE BY MOUTH  DAILY  . rosuvastatin (CRESTOR) 10 MG tablet TAKE 1 TABLET BY MOUTH EVERY DAY  . Semaglutide, 1 MG/DOSE, 2 MG/1.5ML SOPN Inject 1 mg  into the skin once a week.  . tobramycin (TOBREX) 0.3 % ophthalmic solution Place 1 drop into both eyes as directed.  . triamcinolone cream (KENALOG) 0.1 % Apply 1 application topically as needed (dermatitis).     Allergies: Allergies  Allergen Reactions  . Actos [Pioglitazone] Swelling    Swelling all over body and moonface  . Lisinopril Swelling    Swelling, may have had some breathing involvement. Angioedema  .  Nsaids Other (See Comments)    Patient reports internal bleeding. Experienced with tolmetin.   Marland Kitchen Penicillins Shortness Of Breath and Swelling    Arm Swelling with Penicillin (Occurred in 1960s) Breathing - throat swelling with Amoxicillin (Occurred prior to 2002)  . Sulfamethoxazole Hives and Itching    "welps all over" immediately after dose  . Flecainide Other (See Comments)    Blurry  vision  . Hctz [Hydrochlorothiazide] Other (See Comments)    Gout  . Iodinated Diagnostic Agents Itching    Pt. Developed mild itching after receiving IV cm; pt. Held; Dr. Keane Scrape recomended she take 50 mg of benadryl when she goes home-if necessary; Dr Mickey Farber recommends benadryl prior to future exams requiring contrast media, but stated other doctors may recommend another premedication prep.  Vania Rea [Empagliflozin] Other (See Comments)    Patient reported "it made her kidneys not work"  Patient resists retrial/alternative.  . Statins Other (See Comments)    Muscle aches with multiple statins. Able to tolerate rosuvastatin.  . Gabapentin Other (See Comments)    Caused dysphoria   . Losartan Potassium Other (See Comments)    Lower extremity swelling  . Zetia [Ezetimibe] Other (See Comments)    Cramps    Social History: The patient  reports that she quit smoking about 26 years ago. Her smoking use included cigarettes. She has a 60.00 pack-year smoking history. She has never used smokeless tobacco. She reports that she does not drink alcohol and does not use drugs.   Family  History: The patient's family history includes Colon polyps in her daughter; Diabetes in her daughter and maternal grandfather; Heart disease in her father; Hypertension in her mother; Other in her mother; Thyroid disease in her daughter.   Review of Systems: Please see the history of present illness.   All other systems are reviewed and negative.   Physical Exam: VS:  BP 134/70   Pulse 88   Ht '5\' 5"'  (1.651 m)   Wt 203 lb (92.1 kg)   SpO2 97%   BMI 33.78 kg/m  .  BMI Body mass index is 33.78 kg/m.  Wt Readings from Last 3 Encounters:  02/02/20 203 lb (92.1 kg)  01/07/20 206 lb (93.4 kg)  12/30/19 205 lb 6.4 oz (93.2 kg)    General: Alert and in no acute distress.   Cardiac: Regular rate and rhythm. No murmurs, rubs, or gallops. No edema.  Respiratory:  Lungs are clear to auscultation bilaterally with normal work of breathing.  GI: Soft and nontender.  MS: No deformity or atrophy. Gait and ROM intact.  Skin: Warm and dry. Color is normal.  Neuro:  Strength and sensation are intact and no gross focal deficits noted.  Psych: Alert, appropriate and with normal affect.   LABORATORY DATA:  EKG:  EKG is not ordered today.    Lab Results  Component Value Date   WBC 8.7 12/18/2019   HGB 10.3 (L) 12/18/2019   HCT 32.5 (L) 12/18/2019   PLT 223 12/18/2019   GLUCOSE 164 (H) 12/18/2019   CHOL 129 05/25/2019   TRIG 128 05/25/2019   HDL 50 05/25/2019   LDLDIRECT 62 11/24/2018   LDLCALC 56 05/25/2019   ALT 14 12/18/2019   AST 21 12/18/2019   NA 140 12/18/2019   K 4.3 12/18/2019   CL 106 12/18/2019   CREATININE 1.04 (H) 12/18/2019   BUN 23 12/18/2019   CO2 21 (L) 12/18/2019   TSH 5.192 (H) 12/18/2019   INR 1.06  08/14/2011   HGBA1C 6.7 (A) 12/28/2019     BNP (last 3 results) Recent Labs    12/18/19 0325  BNP 49.2    ProBNP (last 3 results) No results for input(s): PROBNP in the last 8760 hours.   Other Studies Reviewed Today:  Echo 12/18/2019 1. Left  ventricular ejection fraction, by estimation, is 65 to 70%. The  left ventricle has normal function. The left ventricle has no regional  wall motion abnormalities. There is mild left ventricular hypertrophy.  Left ventricular diastolic parameters  are indeterminate. Elevated left atrial pressure.  2. Right ventricular systolic function is normal. The right ventricular  size is normal.  3. A small pericardial effusion is present. There is no evidence of  cardiac tamponade.  4. The mitral valve is degenerative. Trivial mitral valve regurgitation.  No evidence of mitral stenosis.  5. The aortic valve has an indeterminant number of cusps. Aortic valve  regurgitation is not visualized. No aortic stenosis is present.  6. The inferior vena cava is normal in size with greater than 50%  respiratory variability, suggesting right atrial pressure of 3 mmHg.    Cardiac telemetry 09/2019 NSR No significant arrhythmias   Myoview stress test 09/2018   Nuclear stress EF: 49%.  No T wave inversion was noted during stress.  There was no ST segment deviation noted during stress.  The study is normal.  This is a low risk study.   Normal perfusion. LVEF 49% with very mild global hypokinesis. This is a low risk study. Consider echo correlation of LVEF.     ASSESSMENT AND PLAN:  1. PAF - has had recent episode - had been out of her Metoprolol due to late mail order - now back on - using short acting CCB prn with good results - no changes made today.   CHADSVASC of at least 6 -   2. Chronic non cardiac chest pain - this is improved.   3. HTN - BP looks fine - no changes made today.   4. HLD - on statin therapy - would continue.   5. Chronic anticoagulation - no problems noted.   6. Chronic diastolic dysfunction - symptoms stable.   Current medicines are reviewed with the patient today.  The patient does not have concerns regarding medicines other than what has been noted  above.  The following changes have been made:  See above.  Labs/ tests ordered today include:   No orders of the defined types were placed in this encounter.    Disposition:   FU with Dr. Johnsie Cancel as planned in March 2022.    Patient is agreeable to this plan and will call if any problems develop in the interim.   SignedTruitt Merle, NP  02/02/2020 10:59 AM  [facility] [address] [place], [place]  [address] Phone: [phone] Fax: [phone]

## 2020-01-26 ENCOUNTER — Ambulatory Visit
Admission: RE | Admit: 2020-01-26 | Discharge: 2020-01-26 | Disposition: A | Discharge status: Home / Self Care | Payer: Medicare Other | Source: Ambulatory Visit | Attending: Family Medicine | Admitting: Family Medicine

## 2020-01-26 ENCOUNTER — Other Ambulatory Visit: Payer: Self-pay

## 2020-01-26 DIAGNOSIS — N6320 Unspecified lump in the left breast, unspecified quadrant: Secondary | ICD-10-CM

## 2020-01-28 ENCOUNTER — Ambulatory Visit: Payer: Medicare Other | Admitting: Podiatry

## 2020-02-02 ENCOUNTER — Other Ambulatory Visit: Payer: Self-pay

## 2020-02-02 ENCOUNTER — Ambulatory Visit: Payer: Medicare Other | Admitting: Nurse Practitioner

## 2020-02-02 ENCOUNTER — Encounter: Payer: Self-pay | Admitting: Nurse Practitioner

## 2020-02-02 VITALS — BP 134/70 | HR 88 | Ht 65.0 in | Wt 203.0 lb

## 2020-02-02 DIAGNOSIS — I5032 Chronic diastolic (congestive) heart failure: Secondary | ICD-10-CM

## 2020-02-02 DIAGNOSIS — I480 Paroxysmal atrial fibrillation: Secondary | ICD-10-CM

## 2020-02-02 DIAGNOSIS — Z7901 Long term (current) use of anticoagulants: Secondary | ICD-10-CM

## 2020-02-02 DIAGNOSIS — I5189 Other ill-defined heart diseases: Secondary | ICD-10-CM

## 2020-02-02 NOTE — Patient Instructions (Addendum)
After Visit Summary:  We will be checking the following labs today - NONE   Medication Instructions:    Continue with your current medicines.    If you need a refill on your cardiac medications before your next appointment, please call your pharmacy.     Testing/Procedures To Be Arranged:  N/A  Follow-Up:   See Dr. Johnsie Cancel as planned in March of 2022     At [facility], you and your health needs are our priority.  As part of our continuing mission to provide you with exceptional heart care, we have created designated Provider Care Teams.  These Care Teams include your primary Cardiologist (physician) and Advanced Practice Providers (APPs -  Physician Assistants and Nurse Practitioners) who all work together to provide you with the care you need, when you need it.  Special Instructions:  . Stay safe, wash your hands for at least 20 seconds and wear a mask when needed.  . It was good to talk with you today.    Call the [place] office at [phone] if you have any questions, problems or concerns.

## 2020-02-09 ENCOUNTER — Ambulatory Visit: Payer: Medicare Other | Admitting: Podiatry

## 2020-02-10 ENCOUNTER — Other Ambulatory Visit: Payer: Self-pay | Admitting: Family Medicine

## 2020-02-10 DIAGNOSIS — D500 Iron deficiency anemia secondary to blood loss (chronic): Secondary | ICD-10-CM

## 2020-03-01 ENCOUNTER — Telehealth: Payer: Self-pay | Admitting: Pharmacist

## 2020-03-01 NOTE — Telephone Encounter (Signed)
Patient called and reports on-going need for Eliquis and Ozempic support.  She is now trying to pay her annual deductible.   Medication Samples have been provided for the patient to pick-up 03/02/2020. (located in refrigerator)  Drug name: Ozempic (semaglutide)  Qty: 2 pens  LOT: [id number]  Exp.Date: 12/26/2021  The patient has been instructed regarding the correct time, dose, and frequency of taking this medication, including desired effects and most common side effects.    Drug name: Eliquis (apixaban)  Qty: 56  LOT: [id number]  Exp.Date: 10/26/2021 (located at front desk)  The patient has been instructed regarding the correct time, dose, and frequency of taking this medication, including desired effects and most common side effects.   Courtney Pena 5:32 PM 03/01/2020

## 2020-03-02 NOTE — Telephone Encounter (Signed)
Noted and agree. 

## 2020-03-08 ENCOUNTER — Ambulatory Visit: Payer: Medicare Other | Admitting: Podiatry

## 2020-03-14 ENCOUNTER — Ambulatory Visit: Payer: Medicare Other | Admitting: Family Medicine

## 2020-03-14 ENCOUNTER — Ambulatory Visit: Payer: Medicare Other | Admitting: Pharmacist

## 2020-03-21 ENCOUNTER — Other Ambulatory Visit: Payer: Self-pay

## 2020-03-21 ENCOUNTER — Encounter: Payer: Self-pay | Admitting: Pharmacist

## 2020-03-21 ENCOUNTER — Encounter: Payer: Self-pay | Admitting: Family Medicine

## 2020-03-21 ENCOUNTER — Ambulatory Visit: Payer: Medicare Other | Admitting: Pharmacist

## 2020-03-21 ENCOUNTER — Ambulatory Visit: Payer: Medicare Other | Admitting: Family Medicine

## 2020-03-21 ENCOUNTER — Ambulatory Visit (INDEPENDENT_AMBULATORY_CARE_PROVIDER_SITE_OTHER): Payer: Medicare Other | Admitting: Pharmacist

## 2020-03-21 VITALS — BP 128/60 | HR 73 | Ht 65.0 in | Wt 207.0 lb

## 2020-03-21 DIAGNOSIS — Z7901 Long term (current) use of anticoagulants: Secondary | ICD-10-CM

## 2020-03-21 DIAGNOSIS — M069 Rheumatoid arthritis, unspecified: Secondary | ICD-10-CM

## 2020-03-21 DIAGNOSIS — E039 Hypothyroidism, unspecified: Secondary | ICD-10-CM | POA: Diagnosis not present

## 2020-03-21 DIAGNOSIS — E1122 Type 2 diabetes mellitus with diabetic chronic kidney disease: Secondary | ICD-10-CM

## 2020-03-21 DIAGNOSIS — I480 Paroxysmal atrial fibrillation: Secondary | ICD-10-CM

## 2020-03-21 DIAGNOSIS — H9313 Tinnitus, bilateral: Secondary | ICD-10-CM

## 2020-03-21 DIAGNOSIS — Z23 Encounter for immunization: Secondary | ICD-10-CM

## 2020-03-21 DIAGNOSIS — H9319 Tinnitus, unspecified ear: Secondary | ICD-10-CM | POA: Insufficient documentation

## 2020-03-21 DIAGNOSIS — M1A09X0 Idiopathic chronic gout, multiple sites, without tophus (tophi): Secondary | ICD-10-CM

## 2020-03-21 DIAGNOSIS — I10 Essential (primary) hypertension: Secondary | ICD-10-CM

## 2020-03-21 DIAGNOSIS — D500 Iron deficiency anemia secondary to blood loss (chronic): Secondary | ICD-10-CM | POA: Diagnosis not present

## 2020-03-21 DIAGNOSIS — Z1159 Encounter for screening for other viral diseases: Secondary | ICD-10-CM | POA: Insufficient documentation

## 2020-03-21 LAB — POCT GLYCOSYLATED HEMOGLOBIN (HGB A1C): Hemoglobin A1C: 6.4 % — AB (ref 4.0–5.6)

## 2020-03-21 MED ORDER — APIXABAN 5 MG PO TABS: 5.0000 mg | ORAL_TABLET | Freq: Two times a day (BID) | ORAL | 11 refills | Status: DC

## 2020-03-21 MED ORDER — APIXABAN 5 MG PO TABS: 5.0000 mg | ORAL_TABLET | Freq: Two times a day (BID) | ORAL | 0 refills | Status: DC

## 2020-03-21 MED ORDER — SEMAGLUTIDE (1 MG/DOSE) 2 MG/1.5ML ~~LOC~~ SOPN: 1.0000 mg | PEN_INJECTOR | SUBCUTANEOUS | 3 refills | Status: DC

## 2020-03-21 MED ORDER — OZEMPIC (0.25 OR 0.5 MG/DOSE) 2 MG/1.5ML ~~LOC~~ SOPN: 0.5000 mg | PEN_INJECTOR | SUBCUTANEOUS | 0 refills | Status: DC

## 2020-03-21 NOTE — Patient Instructions (Addendum)
You look like you are still young old, not old old. I will call with lab test results The hearing thing is tinnitus.  Google that word.  You will see some scary rare causes.  Far and away the symptom is nothing important, just another problem of aging.   Please try taking the hydroxycholoroquine again.  It will hopefully prevent ongoing joint damage.

## 2020-03-21 NOTE — Assessment & Plan Note (Signed)
Asymptomatic.  On both rate controlers and anticoagulants.

## 2020-03-21 NOTE — Assessment & Plan Note (Signed)
Due for Hgb recheck.

## 2020-03-21 NOTE — Progress Notes (Signed)
Reviewed: I agree with Dr. Koval's documentation and management. 

## 2020-03-21 NOTE — Assessment & Plan Note (Signed)
Diabetes longstanding and currently controlled. Patient is able to verbalize appropriate hypoglycemia management plan. Medication adherence appears good.  - Continued GLP-1 semaglutide (Ozempic) at 1mg  weekly (patient using samples and taking 2 x 0.5mg  injections). A1C in office: 6.4 -Discontinued glipizide (Glucotrol XL) due to adequate control of diabetes and occasional symptomatic lows (~low 90s). -Counseled on s/sx of and management of hypoglycemia. -Next A1C anticipated 3-6 months.

## 2020-03-21 NOTE — Assessment & Plan Note (Signed)
Low risk and screen x 1

## 2020-03-21 NOTE — Assessment & Plan Note (Signed)
Per rheum.  Recommend restart hydroxychloroquine as a DMARD

## 2020-03-21 NOTE — Assessment & Plan Note (Addendum)
Seems age related.  No further WU at this time.  Recommend masking sounds at night.

## 2020-03-21 NOTE — Progress Notes (Signed)
    SUBJECTIVE:   CHIEF COMPLAINT / HPI:   Ms Festus Holts is here for a recheck of her diabetes and other medical problems.  Overall, she feels quite good.  She is more active and alert than most of her peers.  Issues: 1. DM is under good control with the combo of meds and weight loss.  Today's A1C=6.4.  She is tolerating meds well.  She will see Dr. Valentina Lucks today for continued medication assistence with her ozempic.   2. CKD.  No swelling.  Due for creat 3. HBP. No CP or SOB.  Hx of hypokalemia.  Due for K recheck.  On metoprolol and diltiazem which also treat her a fib. 4. Hypothyroid.  Last TSH mildly elevatied.  She was not taking her levothyroxine regularly at that time.  Now she is and is due for recheck.  Does not have heat of cold intolerance.   5. Never hep c screened 6. She complains of significant humming in both ears.  Present x 1 year.  Mild bilateral hearing loss.  I hear better than most of my friends.   7. A fib.  Rate controled and on anticoag. 8. States that she has not been taking gout or hydroxychloroquine.  Rhem has dxed with rheumatoid arthritis.  OBJECTIVE:   BP 128/60   Pulse 73   Ht 5\' 5"  (1.651 m)   Wt 207 lb (93.9 kg)   SpO2 98%   BMI 34.45 kg/m   Ears, TMs normal without cerumen impaction. Lungs clear Cardiac RRR without m or g Abd benign Ext no edema.  ASSESSMENT/PLAN:   Hypothyroidism Check TSH now that she is taking her replacement regularly.  Type 2 diabetes mellitus with diabetic chronic kidney disease ([place]) Good control on current meds.  HYPERTENSION, BENIGN SYSTEMIC Good control on current meds  Paroxysmal atrial fibrillation ([facility]) Asymptomatic.  On both rate controlers and anticoagulants.  Anemia due to GI blood loss Due for Hgb recheck.  Chronic gout of multiple sites Check uric acid off gout meds.  Need for hepatitis C screening test Low risk and screen x 1  Rheumatoid arthritis ([place]) Per rheum.  Recommend restart  hydroxychloroquine as a DMARD     [name], MD [place]

## 2020-03-21 NOTE — Patient Instructions (Signed)
Nice to see you today!  A1C was 6.4   STOP glipizide  Continue Ozempic 1mg  once weekly.   Continue Eliquis 5mg  twice daily.   Let us know how the insurance coverage works out.  We would like to complete your paperwork for both of the above meds this month.

## 2020-03-21 NOTE — Assessment & Plan Note (Signed)
Check uric acid off gout meds.

## 2020-03-21 NOTE — Assessment & Plan Note (Signed)
Good control on current meds. 

## 2020-03-21 NOTE — Assessment & Plan Note (Addendum)
Check TSH now that she is taking her replacement regularly. Based on mildly high TSH, will increase dose to 125 mcg

## 2020-03-21 NOTE — Progress Notes (Signed)
    S:     Medication management (diabetes/AFib/Anticoag)  Patient arrives in good spirits, ambulating without assistance.  Presents for diabetes evaluation, education, and management.  Patient was referred and last seen by Dr. Andria Frames today.  Patient reports Diabetes was diagnosed in 2012.   Insurance coverage/medication affordability: unchanged - remains in coverage gap.  Medication adherence reported good, but denies use of recently prescribed rheumatology medications (allopurinol, colchicine, hydroxychloroquine) due to "severe" diarrhea.   Current diabetes medications include: metformin (Glucophage-XR) 500mg  QID, semaglutide (Ozempic) 1mg  weekly, glipizide (Glucotrol XL) 10mg  daily  Current hypertension medications include: metoprolol succinate (Toprol-XL) 100mg  BID, diltiazem (Cardizem CD) 360mg  daily  Current hyperlipidemia medications include: rosuvastatin (Crestor) 10mg  daily  Patient reports occasional symptoms (tachycardia, shakiness, anxiety) when sugars get to low 90s (3-4x per month upon awakening), unclear if due to hypoglycemia or due to anxiety surrounding potential hypoglycemic events.    O:  Physical Exam Constitutional:      Appearance: Normal appearance.  Cardiovascular:     Rate and Rhythm: Normal rate.  Musculoskeletal:        General: Normal range of motion.  Neurological:     Mental Status: She is alert and oriented to person, place, and time.  Psychiatric:        Mood and Affect: Mood normal.        Behavior: Behavior normal.        Thought Content: Thought content normal.      Review of Systems  Constitutional: Negative.   Respiratory: Negative.   Cardiovascular: Negative.   Endo/Heme/Allergies: Negative.      Lab Results  Component Value Date   HGBA1C 6.4 (A) 03/21/2020    Home fasting blood sugars: mostly low 100s 2 hour post-meal/random blood sugars: mostly mid 100s (140-150s)   Clinical Atherosclerotic Cardiovascular Disease  (ASCVD): Yes The ASCVD Risk score Mikey Bussing DC Jr., et al., 2013) failed to calculate for the following reasons:   The valid total cholesterol range is 130 to 320 mg/dL    A/P: Diabetes longstanding and currently controlled. Patient is able to verbalize appropriate hypoglycemia management plan. Medication adherence appears good.  - Continued GLP-1 semaglutide (Ozempic) at 1mg  weekly (patient using samples and taking 2 x 0.5mg  injections). A1C in office: 6.4 -Discontinued glipizide (Glucotrol XL) due to adequate control of diabetes and occasional symptomatic lows (~low 90s). -Counseled on s/sx of and management of hypoglycemia. -Next A1C anticipated 3-6 months.   Anticoagulation: long-term use of Eliquis BID for Atrial Fibrillation. 3 weeks of samples provided (42 total tabs).  Patient was provided with instructions on applying for patient assistance programs for both Ozempic and Eliquis. Will follow-up regarding her paperwork in the next month.   Written patient instructions provided.  Total time in face to face counseling 30 minutes.    Follow up Pharmacist/PCP Clinic Visit in 3 months.   Patient seen with Toma Aran, PharmD Candidate, Claudina Lick, PharmD - PGY-1 Resident, and Marzetta Merino, PharmD.

## 2020-03-22 LAB — CBC
Hematocrit: 33.9 % — ABNORMAL LOW (ref 34.0–46.6)
Hemoglobin: 10.6 g/dL — ABNORMAL LOW (ref 11.1–15.9)
MCH: 27.6 pg (ref 26.6–33.0)
MCHC: 31.3 g/dL — ABNORMAL LOW (ref 31.5–35.7)
MCV: 88 fL (ref 79–97)
Platelets: 236 10*3/uL (ref 150–450)
RBC: 3.84 x10E6/uL (ref 3.77–5.28)
RDW: 14.7 % (ref 11.7–15.4)
WBC: 6.3 10*3/uL (ref 3.4–10.8)

## 2020-03-22 LAB — BASIC METABOLIC PANEL
BUN/Creatinine Ratio: 17 (ref 12–28)
BUN: 18 mg/dL (ref 8–27)
CO2: 22 mmol/L (ref 20–29)
Calcium: 9.8 mg/dL (ref 8.7–10.3)
Chloride: 104 mmol/L (ref 96–106)
Creatinine, Ser: 1.07 mg/dL — ABNORMAL HIGH (ref 0.57–1.00)
GFR calc Af Amer: 57 mL/min/{1.73_m2} — ABNORMAL LOW (ref >59)
GFR calc non Af Amer: 49 mL/min/{1.73_m2} — ABNORMAL LOW (ref >59)
Glucose: 143 mg/dL — ABNORMAL HIGH (ref 65–99)
Potassium: 4.8 mmol/L (ref 3.5–5.2)
Sodium: 139 mmol/L (ref 134–144)

## 2020-03-22 LAB — TSH: TSH: 5.85 u[IU]/mL — ABNORMAL HIGH (ref 0.450–4.500)

## 2020-03-22 LAB — HEPATITIS C ANTIBODY: Hep C Virus Ab: <0.1 s/co ratio (ref 0.0–0.9)

## 2020-03-22 LAB — URIC ACID: Uric Acid: 7.4 mg/dL (ref 3.1–7.9)

## 2020-03-22 MED ORDER — LEVOTHYROXINE SODIUM 125 MCG PO TABS: 125.0000 ug | ORAL_TABLET | ORAL | 3 refills | Status: DC

## 2020-03-22 NOTE — Addendum Note (Signed)
Addended by: [name] on: 03/22/2020 11:26 AM   Modules accepted: Orders

## 2020-03-29 ENCOUNTER — Telehealth: Payer: Self-pay

## 2020-03-29 ENCOUNTER — Telehealth: Payer: Self-pay | Admitting: *Deleted

## 2020-03-29 NOTE — Telephone Encounter (Signed)
Signed patient portions of patient assistance applications for Eliquis and Ozempic on file.  Patient aware that proof of income is needed to complete the application process.  Will have provider complete provider portion once proof of income is obtained.

## 2020-03-29 NOTE — Telephone Encounter (Signed)
Created in error

## 2020-03-29 NOTE — Telephone Encounter (Signed)
Received fax requesting clarification for Ozempic.  Note says-please clarify directions for Ozempic pen 1 mg dose. This medication is typically titrated for effectiveness.    Titration:Initially, 0.25mg  subcutaneously once weekly at any time of day with or without meals. Must titrate for effective glycemic control. After 4 weeks increase to 0.5mg  once weekly.  If needed the dosage may be increased after 4 weeks. Max 1 mg/week subcutaneously.   Placed form in your box for reference.Nina Hoar Zimmerman Rumple, CMA

## 2020-03-30 NOTE — Telephone Encounter (Signed)
Form completed.  Called patient and verified that she has received Ozempic by other means and has already been titrated up to the 1 mg dose.

## 2020-03-31 ENCOUNTER — Ambulatory Visit: Payer: Medicare Other | Admitting: Podiatry

## 2020-04-06 ENCOUNTER — Telehealth: Payer: Self-pay

## 2020-04-06 NOTE — Telephone Encounter (Signed)
Left voicemail with patient about still needing her income financials for both patient assistance applications. Gave her my direct # to call back: [phone]

## 2020-04-11 ENCOUNTER — Telehealth: Payer: Self-pay

## 2020-04-11 NOTE — Telephone Encounter (Signed)
Spoke to Courtney Pena about her income info being needed for her Ozempic [employer]) & Eliquis ([employer]) apps. She will bring in her 2022 SSI statement and her 2022 Retirement statement to [facility] on Tuesday 04/12/20.

## 2020-04-14 ENCOUNTER — Ambulatory Visit: Payer: Medicare Other | Admitting: Podiatry

## 2020-04-14 ENCOUNTER — Encounter: Payer: Self-pay | Admitting: Podiatry

## 2020-04-14 ENCOUNTER — Ambulatory Visit (INDEPENDENT_AMBULATORY_CARE_PROVIDER_SITE_OTHER): Payer: Medicare Other

## 2020-04-14 DIAGNOSIS — M778 Other enthesopathies, not elsewhere classified: Secondary | ICD-10-CM

## 2020-04-14 DIAGNOSIS — Q666 Other congenital valgus deformities of feet: Secondary | ICD-10-CM

## 2020-04-14 DIAGNOSIS — M19079 Primary osteoarthritis, unspecified ankle and foot: Secondary | ICD-10-CM

## 2020-04-14 DIAGNOSIS — M7751 Other enthesopathy of right foot: Secondary | ICD-10-CM

## 2020-04-14 DIAGNOSIS — M069 Rheumatoid arthritis, unspecified: Secondary | ICD-10-CM

## 2020-04-14 DIAGNOSIS — M7750 Other enthesopathy of unspecified foot: Secondary | ICD-10-CM

## 2020-04-14 DIAGNOSIS — E1142 Type 2 diabetes mellitus with diabetic polyneuropathy: Secondary | ICD-10-CM

## 2020-04-14 DIAGNOSIS — M2041 Other hammer toe(s) (acquired), right foot: Secondary | ICD-10-CM | POA: Diagnosis not present

## 2020-04-14 DIAGNOSIS — M779 Enthesopathy, unspecified: Secondary | ICD-10-CM

## 2020-04-14 DIAGNOSIS — M2042 Other hammer toe(s) (acquired), left foot: Secondary | ICD-10-CM

## 2020-04-14 MED ORDER — PREGABALIN 50 MG PO CAPS: 50.0000 mg | ORAL_CAPSULE | Freq: Two times a day (BID) | ORAL | 2 refills | Status: DC

## 2020-04-14 NOTE — Progress Notes (Signed)
Submitted application for OZEMPIC 2MG /1.5ML to [employer] for re enrollment of patient assistance.   Phone: [phone]

## 2020-04-15 NOTE — Progress Notes (Signed)
Submitted application for ELIQUIS to [employer]) for patient assistance.   Phone: [phone]

## 2020-04-17 NOTE — Progress Notes (Signed)
Subjective:  Patient ID: Courtney Pena, female    DOB: [birth year],  MRN: [id number] HPI Chief Complaint  Patient presents with  . Foot Pain    Medial foot right - tender, some swelling x 2 days, no injury  . Diabetes    Follow up diabetic neuropathy - "sand paper sensations", continued burning and numbness, couldn't take lyrica  . New Patient (Initial Visit)    Est pt 2020    80 y.o. female presents with the above complaint.   ROS: Denies fever chills nausea vomiting muscle aches pains calf pain back pain chest pain shortness of breath.  Past Medical History:  Diagnosis Date  . Allergy   . Anemia due to GI blood loss 08/12/2011  . Arthritis   . Atrial flutter ([place]) 12-07-2010   converted in ED with 300 mg flecainide  . CAD (coronary artery disease)    a. mild per cath in 2004;  b. nonischemic Myoview in March 2012;  c. Lex MV 1/14:  EF 66%, no ischemia  . Cataract   . Chronic anticoagulation - Xarelto started 07/07/2015, CHADS2CVASC=5 07/07/2015  . Clotting disorder ([place])   . Diastolic CHF ([place]) 0/25/8527  . External hemorrhoids 06/07/2010  . Gastric antral vascular ectasia    source for gi bleed in 07/2011 - Xarelto stopped  . Gastritis   . GERD (gastroesophageal reflux disease)   . Hiatal hernia   . Hyperlipidemia   . Hypertension   . Hypothyroidism   . Neuromuscular disorder ([place])   . Obesity   . Paroxysmal atrial fibrillation (HCC)   . Personal history of colonic polyps 06/06/2009   cecal polyp  . Sleep apnea    does not use CPAP  . Type 2 diabetes mellitus with diabetic chronic kidney disease ([place]) 04/25/2006      . Ulcer    Past Surgical History:  Procedure Laterality Date  . BREAST BIOPSY Left   . BREAST EXCISIONAL BIOPSY Left    benign  . CARDIAC CATHETERIZATION  1999&2004  . CARPAL TUNNEL RELEASE Bilateral 2003  . CATARACT EXTRACTION Bilateral   . COLONOSCOPY    . ESOPHAGOGASTRODUODENOSCOPY  08/13/2011   Procedure:  ESOPHAGOGASTRODUODENOSCOPY (EGD);  Surgeon: Gatha Mayer, MD;  Location: [facility] ENDOSCOPY;  Service: Endoscopy;  Laterality: N/A;  . POLYPECTOMY      Current Outpatient Medications:  .  pregabalin (LYRICA) 50 MG capsule, Take 1 capsule (50 mg total) by mouth 2 (two) times daily., Disp: 60 capsule, Rfl: 2 .  acetaminophen (TYLENOL) 650 MG CR tablet, Take 650 mg by mouth every 8 (eight) hours as needed for pain., Disp: , Rfl:  .  allopurinol (ZYLOPRIM) 100 MG tablet, Take 100 mg by mouth daily., Disp: , Rfl:  .  apixaban (ELIQUIS) 5 MG TABS tablet, Take 1 tablet (5 mg total) by mouth 2 (two) times daily., Disp: 42 tablet, Rfl: 0 .  Apoaequorin (PREVAGEN PO), Take 1 tablet by mouth., Disp: , Rfl:  .  blood glucose meter kit and supplies, Dispense based on patient and insurance preference. Use up to four times daily as directed. (FOR ICD-10 E10.9, E11.9)., Disp: 1 each, Rfl: 0 .  cetirizine (ZYRTEC) 10 MG tablet, Take 10 mg by mouth as needed for allergies. Takes 10 mg as needed for sinus trouble, Disp: , Rfl:  .  Cholecalciferol (VITAMIN D3) 2000 units TABS, Take 2 capsules by mouth daily., Disp: , Rfl:  .  Coenzyme Q10 (COQ10) 100 MG CAPS, Take 100 mEq by mouth  daily. , Disp: , Rfl:  .  diclofenac Sodium (VOLTAREN) 1 % GEL, Apply topically as needed (pain)., Disp: , Rfl:  .  diltiazem (CARDIZEM CD) 360 MG 24 hr capsule, TAKE 1 CAPSULE BY MOUTH  DAILY, Disp: 90 capsule, Rfl: 3 .  diltiazem (CARDIZEM) 30 MG tablet, Take 1 tablet (30 mg total) by mouth every 4 (four) hours as needed (elevated HR as long as BP > 100)., Disp: 30 tablet, Rfl: 6 .  ferrous sulfate 325 (65 FE) MG EC tablet, Take 650 mg by mouth daily. , Disp: , Rfl:  .  glucose blood (ONETOUCH ULTRA) test strip, 1 Container by Other route every morning., Disp: , Rfl:  .  glucose blood test strip, Use to test blood sugar 3 times daily., Disp: 300 each, Rfl: 3 .  Insulin Pen Needle 31G X 8 MM MISC, Use with pen to inject once daily., Disp: 100  each, Rfl: 3 .  Lancets (ONETOUCH ULTRASOFT) lancets, Use to check blood sugar daily., Disp: 100 each, Rfl: 3 .  leflunomide (ARAVA) 10 MG tablet, Take 10 mg by mouth daily., Disp: , Rfl:  .  levothyroxine (SYNTHROID) 125 MCG tablet, Take 1 tablet (125 mcg total) by mouth every morning. 30 minutes before food, Disp: 90 tablet, Rfl: 3 .  metFORMIN (GLUCOPHAGE-XR) 500 MG 24 hr tablet, Take 4 tablets (2,000 mg total) by mouth daily., Disp: 360 tablet, Rfl: 3 .  metoprolol succinate (TOPROL-XL) 100 MG 24 hr tablet, TAKE 1 TABLET BY MOUTH 2  TIMES DAILY. TAKE WITH OR  IMMEDIATELY FOLLOWING A  MEAL., Disp: 180 tablet, Rfl: 1 .  Multiple Vitamin (MULTI-VITAMINS) TABS, Take daily by mouth., Disp: , Rfl:  .  nitroGLYCERIN (NITROSTAT) 0.4 MG SL tablet, Place 1 tablet (0.4 mg total) under the tongue every 5 (five) minutes as needed for chest pain (Call 911 if chest pain after three doses)., Disp: 25 tablet, Rfl: 3 .  omeprazole (PRILOSEC) 40 MG capsule, TAKE 1 CAPSULE BY MOUTH  DAILY, Disp: 90 capsule, Rfl: 3 .  rosuvastatin (CRESTOR) 10 MG tablet, TAKE 1 TABLET BY MOUTH EVERY DAY, Disp: 90 tablet, Rfl: 3 .  Semaglutide, 1 MG/DOSE, 2 MG/1.5ML SOPN, Inject 1 mg into the skin once a week., Disp: 9 mL, Rfl: 3 .  Semaglutide,0.25 or 0.5MG/DOS, (OZEMPIC, 0.25 OR 0.5 MG/DOSE,) 2 MG/1.5ML SOPN, Inject 0.5 mg into the skin once a week., Disp: 1.5 mL, Rfl: 0 .  triamcinolone cream (KENALOG) 0.1 %, Apply 1 application topically as needed (dermatitis)., Disp: , Rfl:   Allergies  Allergen Reactions  . Actos [Pioglitazone] Swelling    Swelling all over body and moonface  . Lisinopril Swelling    Swelling, may have had some breathing involvement. Angioedema  . Nsaids Other (See Comments)    Patient reports internal bleeding. Experienced with tolmetin.   Marland Kitchen Penicillins Shortness Of Breath and Swelling    Arm Swelling with Penicillin (Occurred in 1960s) Breathing - throat swelling with Amoxicillin (Occurred prior to  2002)  . Sulfamethoxazole Hives and Itching    "welps all over" immediately after dose  . Flecainide Other (See Comments)    Blurry  vision  . Hctz [Hydrochlorothiazide] Other (See Comments)    Gout  . Iodinated Diagnostic Agents Itching    Pt. Developed mild itching after receiving IV cm; pt. Held; Dr. Keane Scrape recomended she take 50 mg of benadryl when she goes home-if necessary; Dr Mickey Farber recommends benadryl prior to future exams requiring contrast media, but stated other  doctors may recommend another premedication prep.  Vania Rea [Empagliflozin] Other (See Comments)    Patient reported "it made her kidneys not work"  Patient resists retrial/alternative.  . Statins Other (See Comments)    Muscle aches with multiple statins. Able to tolerate rosuvastatin.  . Gabapentin Other (See Comments)    Caused dysphoria   . Losartan Potassium Other (See Comments)    Lower extremity swelling  . Zetia [Ezetimibe] Other (See Comments)    Cramps   Review of Systems Objective:  There were no vitals filed for this visit.  General: Well developed, nourished, in no acute distress, alert and oriented x3   Dermatological: Skin is warm, dry and supple bilateral. Nails x 10 are well maintained; remaining integument appears unremarkable at this time. There are no open sores, no preulcerative lesions, no rash or signs of infection present.  Vascular: Dorsalis Pedis artery and Posterior Tibial artery pedal pulses are 2/4 bilateral with immedate capillary fill time. Pedal hair growth present. No varicosities and no lower extremity edema present bilateral.   Neruologic: Grossly intact via light touch bilateral. Vibratory intact via tuning fork bilateral. Protective threshold with Semmes Wienstein monofilament diminished to all pedal sites bilateral. Patellar and Achilles deep tendon reflexes 2+ bilateral. No Babinski or clonus noted bilateral.   Musculoskeletal: No gross boney pedal deformities  bilateral. No pain, crepitus, or limitation noted with foot and ankle range of motion bilateral. Muscular strength 5/5 in all groups tested bilateral. Severe pes planovalgus. Osteoarthritis midfoot and forefoot.  Gait: Unassisted, Nonantalgic.    Radiographs:  Radiographs taken today demonstrate an osseously mature individual with mild to moderate osteopenia. She also has severe hallux abductovalgus deformity of the left foot with Martell sign which appears to to demonstrate gouty arthritis is present. 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 Significant osteoarthritic changes left and right. No acute fractures were noted.  Assessment & Plan:   Assessment: Osteoarthritis diabetic peripheral neuropathy digital deformities pes planovalgus.  Plan: Requesting diabetic shoes with custom inserts at this time. Also started her on Lyrica 50 mg 1 p.o. twice daily. We will follow-up with her in 1 month.     Harout Scheurich T. [place], [place]

## 2020-04-18 ENCOUNTER — Other Ambulatory Visit: Payer: Medicare Other | Admitting: Orthotics

## 2020-04-19 ENCOUNTER — Other Ambulatory Visit: Payer: Self-pay

## 2020-04-19 ENCOUNTER — Ambulatory Visit: Payer: Medicare Other | Admitting: Orthotics

## 2020-04-19 DIAGNOSIS — M2040 Other hammer toe(s) (acquired), unspecified foot: Secondary | ICD-10-CM

## 2020-04-19 DIAGNOSIS — M19079 Primary osteoarthritis, unspecified ankle and foot: Secondary | ICD-10-CM

## 2020-04-19 DIAGNOSIS — Q666 Other congenital valgus deformities of feet: Secondary | ICD-10-CM

## 2020-04-19 DIAGNOSIS — M069 Rheumatoid arthritis, unspecified: Secondary | ICD-10-CM

## 2020-04-19 DIAGNOSIS — E1142 Type 2 diabetes mellitus with diabetic polyneuropathy: Secondary | ICD-10-CM

## 2020-04-19 DIAGNOSIS — M2010 Hallux valgus (acquired), unspecified foot: Secondary | ICD-10-CM

## 2020-04-19 NOTE — Progress Notes (Signed)

## 2020-04-22 NOTE — Progress Notes (Signed)
Received notification from [place] ([place]) regarding approval for [employer]. Patient assistance approved from 04/22/2020 to 04/21/2021. Meds will ship to patients home.  Phone: [phone]

## 2020-04-22 NOTE — Progress Notes (Signed)
CARDIOLOGY OFFICE NOTE  Date:  04/28/2020    Courtney Pena Date of Birth: [birth year] Medical Record #[id number]  PCP:  [name], MD  Cardiologist:  Johnsie Cancel - also seen in AF clinic  No chief complaint on file.   History of Present Illness: 80 y.o.  history of known mild CAD on cath in 2004, HTN, DM, HLD, obesity, OSA, PAF on Eliquis, CHADSVASC of at least 5, intolerant to Flecainide due to visual changes . She has seen Dr. Rayann Heman in the past and declined ablation but noted sotalol was an option. Also follows with the Afib clinic. Myoview in 03/2018 showed no significant ischemia with an LVEF 49% and was overall al ow risk scan. Seen in 09/2019 and felt to have more AF and using CCB more. Event monitor was obtained which did not show AF.   She was seen in the ER 12/18/19 for Afib RVR. She was started on IV Cardizem and self converted. She had run out of her metoprolol thru her mail order. She was seen in the Afib clinic 12/30/19 and was in [place] and was back on her BB.    Seen by diabetic foot doctor 04/14/20 started on Lyrica for neuropathy and planned orthopedic shoes   TTE 12/18/19 EF 65-70% trivial MR / pericardial effusion   Having more and longer PAF clinically 4x/weei Also some SSCP under left breast not always exertional    Past Medical History:  Diagnosis Date  . Allergy   . Anemia due to GI blood loss 08/12/2011  . Arthritis   . Atrial flutter ([place]) 12-07-2010   converted in ED with 300 mg flecainide  . CAD (coronary artery disease)    a. mild per cath in 2004;  b. nonischemic Myoview in March 2012;  c. Lex MV 1/14:  EF 66%, no ischemia  . Cataract   . Chronic anticoagulation - Xarelto started 07/07/2015, CHADS2CVASC=5 07/07/2015  . Clotting disorder ([place])   . Diastolic CHF ([place]) 2/42/6834  . External hemorrhoids 06/07/2010  . Gastric antral vascular ectasia    source for gi bleed in 07/2011 - Xarelto stopped  . Gastritis   . GERD (gastroesophageal  reflux disease)   . Hiatal hernia   . Hyperlipidemia   . Hypertension   . Hypothyroidism   . Neuromuscular disorder ([place])   . Obesity   . Paroxysmal atrial fibrillation (HCC)   . Personal history of colonic polyps 06/06/2009   cecal polyp  . Sleep apnea    does not use CPAP  . Type 2 diabetes mellitus with diabetic chronic kidney disease ([place]) 04/25/2006      . Ulcer     Past Surgical History:  Procedure Laterality Date  . BREAST BIOPSY Left   . BREAST EXCISIONAL BIOPSY Left    benign  . CARDIAC CATHETERIZATION  1999&2004  . CARPAL TUNNEL RELEASE Bilateral 2003  . CATARACT EXTRACTION Bilateral   . COLONOSCOPY    . ESOPHAGOGASTRODUODENOSCOPY  08/13/2011   Procedure: ESOPHAGOGASTRODUODENOSCOPY (EGD);  Surgeon: Gatha Mayer, MD;  Location: [facility] ENDOSCOPY;  Service: Endoscopy;  Laterality: N/A;  . POLYPECTOMY       Medications: Current Meds  Medication Sig  . acetaminophen (TYLENOL) 650 MG CR tablet Take 650 mg by mouth every 8 (eight) hours as needed for pain.  Marland Kitchen allopurinol (ZYLOPRIM) 100 MG tablet Take 100 mg by mouth daily.  Marland Kitchen apixaban (ELIQUIS) 5 MG TABS tablet Take 1 tablet (5 mg total) by mouth 2 (two)  times daily.  Marland Kitchen Apoaequorin (PREVAGEN PO) Take 1 tablet by mouth.  . blood glucose meter kit and supplies Dispense based on patient and insurance preference. Use up to four times daily as directed. (FOR ICD-10 E10.9, E11.9).  . cetirizine (ZYRTEC) 10 MG tablet Take 10 mg by mouth as needed for allergies. Takes 10 mg as needed for sinus trouble  . Cholecalciferol (VITAMIN D3) 2000 units TABS Take 2 capsules by mouth daily.  . Coenzyme Q10 (COQ10) 100 MG CAPS Take 100 mEq by mouth daily.   . diclofenac Sodium (VOLTAREN) 1 % GEL Apply topically as needed (pain).  Marland Kitchen diltiazem (CARDIZEM CD) 360 MG 24 hr capsule TAKE 1 CAPSULE BY MOUTH  DAILY  . diltiazem (CARDIZEM) 30 MG tablet Take 1 tablet (30 mg total) by mouth every 4 (four) hours as needed (elevated HR as long as BP > 100).   . ferrous sulfate 325 (65 FE) MG EC tablet Take 650 mg by mouth daily.   Marland Kitchen glucose blood (ONETOUCH ULTRA) test strip 1 Container by Other route every morning.  Marland Kitchen glucose blood test strip Use to test blood sugar 3 times daily.  . Insulin Pen Needle 31G X 8 MM MISC Use with pen to inject once daily.  . Lancets (ONETOUCH ULTRASOFT) lancets Use to check blood sugar daily.  Marland Kitchen leflunomide (ARAVA) 10 MG tablet Take 10 mg by mouth daily.  Marland Kitchen levothyroxine (SYNTHROID) 125 MCG tablet Take 1 tablet (125 mcg total) by mouth every morning. 30 minutes before food  . metFORMIN (GLUCOPHAGE-XR) 500 MG 24 hr tablet TAKE 4 TABLETS BY MOUTH  DAILY  . metoprolol succinate (TOPROL-XL) 100 MG 24 hr tablet TAKE 1 TABLET BY MOUTH 2  TIMES DAILY. TAKE WITH OR  IMMEDIATELY FOLLOWING A  MEAL.  . Multiple Vitamin (MULTI-VITAMINS) TABS Take daily by mouth.  . nitroGLYCERIN (NITROSTAT) 0.4 MG SL tablet Place 1 tablet (0.4 mg total) under the tongue every 5 (five) minutes as needed for chest pain (Call 911 if chest pain after three doses).  Marland Kitchen omeprazole (PRILOSEC) 40 MG capsule TAKE 1 CAPSULE BY MOUTH  DAILY  . pregabalin (LYRICA) 50 MG capsule Take 1 capsule (50 mg total) by mouth 2 (two) times daily.  . rosuvastatin (CRESTOR) 10 MG tablet TAKE 1 TABLET BY MOUTH EVERY DAY  . Semaglutide, 1 MG/DOSE, 2 MG/1.5ML SOPN Inject 1 mg into the skin once a week.  . Semaglutide,0.25 or 0.5MG /DOS, (OZEMPIC, 0.25 OR 0.5 MG/DOSE,) 2 MG/1.5ML SOPN Inject 0.5 mg into the skin once a week.  . triamcinolone cream (KENALOG) 0.1 % Apply 1 application topically as needed (dermatitis).     Allergies: Allergies  Allergen Reactions  . Actos [Pioglitazone] Swelling    Swelling all over body and moonface  . Lisinopril Swelling    Swelling, may have had some breathing involvement. Angioedema  . Nsaids Other (See Comments)    Patient reports internal bleeding. Experienced with tolmetin.   Marland Kitchen Penicillins Shortness Of Breath and Swelling    Arm  Swelling with Penicillin (Occurred in 1960s) Breathing - throat swelling with Amoxicillin (Occurred prior to 2002)  . Sulfamethoxazole Hives and Itching    "welps all over" immediately after dose  . Flecainide Other (See Comments)    Blurry  vision  . Hctz [Hydrochlorothiazide] Other (See Comments)    Gout  . Iodinated Diagnostic Agents Itching    Pt. Developed mild itching after receiving IV cm; pt. Held; Dr. Keane Scrape recomended she take 50 mg of benadryl when  she goes home-if necessary; Dr Mickey Farber recommends benadryl prior to future exams requiring contrast media, but stated other doctors may recommend another premedication prep.  Vania Rea [Empagliflozin] Other (See Comments)    Patient reported "it made her kidneys not work"  Patient resists retrial/alternative.  . Statins Other (See Comments)    Muscle aches with multiple statins. Able to tolerate rosuvastatin.  . Gabapentin Other (See Comments)    Caused dysphoria   . Losartan Potassium Other (See Comments)    Lower extremity swelling  . Zetia [Ezetimibe] Other (See Comments)    Cramps    Social History: The patient  reports that she quit smoking about 27 years ago. Her smoking use included cigarettes. She has a 60.00 pack-year smoking history. She has never used smokeless tobacco. She reports that she does not drink alcohol and does not use drugs.   Family History: The patient's family history includes Colon polyps in her daughter; Diabetes in her daughter and maternal grandfather; Heart disease in her father; Hypertension in her mother; Other in her mother; Thyroid disease in her daughter.   Review of Systems: Please see the history of present illness.   All other systems are reviewed and negative.   Physical Exam: VS:  BP 130/84   Pulse 77   Ht $R'5\' 5"'af$  (1.651 m)   Wt 92.1 kg   SpO2 97%   BMI 33.78 kg/m  .  BMI Body mass index is 33.78 kg/m.  Wt Readings from Last 3 Encounters:  04/28/20 92.1 kg  03/21/20 93.9 kg   02/02/20 92.1 kg   Affect appropriate Healthy:  appears stated age 89: normal Neck supple with no adenopathy JVP normal no bruits no thyromegaly Lungs clear with no wheezing and good diaphragmatic motion Heart:  S1/S2 no murmur, no rub, gallop or click PMI normal Abdomen: benighn, BS positve, no tenderness, no AAA no bruit.  No HSM or HJR Distal pulses intact with no bruits No edema Neuro non-focal Skin warm and dry No muscular weakness    LABORATORY DATA:  EKG:  01/07/20 NSR   Lab Results  Component Value Date   WBC 6.3 03/21/2020   HGB 10.6 (L) 03/21/2020   HCT 33.9 (L) 03/21/2020   PLT 236 03/21/2020   GLUCOSE 143 (H) 03/21/2020   CHOL 129 05/25/2019   TRIG 128 05/25/2019   HDL 50 05/25/2019   LDLDIRECT 62 11/24/2018   LDLCALC 56 05/25/2019   ALT 14 12/18/2019   AST 21 12/18/2019   NA 139 03/21/2020   K 4.8 03/21/2020   CL 104 03/21/2020   CREATININE 1.07 (H) 03/21/2020   BUN 18 03/21/2020   CO2 22 03/21/2020   TSH 5.850 (H) 03/21/2020   INR 1.06 08/14/2011   HGBA1C 6.4 (A) 03/21/2020     BNP (last 3 results) Recent Labs    12/18/19 0325  BNP 49.2    ProBNP (last 3 results) No results for input(s): PROBNP in the last 8760 hours.   Other Studies Reviewed Today:  Echo 12/18/2019 1. Left ventricular ejection fraction, by estimation, is 65 to 70%. The  left ventricle has normal function. The left ventricle has no regional  wall motion abnormalities. There is mild left ventricular hypertrophy.  Left ventricular diastolic parameters  are indeterminate. Elevated left atrial pressure.  2. Right ventricular systolic function is normal. The right ventricular  size is normal.  3. A small pericardial effusion is present. There is no evidence of  cardiac tamponade.  4. The mitral  valve is degenerative. Trivial mitral valve regurgitation.  No evidence of mitral stenosis.  5. The aortic valve has an indeterminant number of cusps. Aortic valve   regurgitation is not visualized. No aortic stenosis is present.  6. The inferior vena cava is normal in size with greater than 50%  respiratory variability, suggesting right atrial pressure of 3 mmHg.    Cardiac telemetry 09/2019 NSR No significant arrhythmias   Myoview stress test 09/2018   Nuclear stress EF: 49%.  No T wave inversion was noted during stress.  There was no ST segment deviation noted during stress.  The study is normal.  This is a low risk study.   Normal perfusion. LVEF 49% with very mild global hypokinesis. This is a low risk study. Consider echo correlation of LVEF.     ASSESSMENT AND PLAN:  1. PAF - on metoprolol and SA cardizem  CHADSVASC of at least 6 -Having more palpitations and likely PAF F/U Dr Rayann Heman to consider sotolol vs ablative RX    2. Chronic non cardiac chest pain - recurrent been 1.5 years since myovue Given need for AAT will order lexiscan myvoue   3. HTN - Well controlled.  Continue current medications and low sodium Dash type diet.    4. HLD - on statin therapy    5. Chronic anticoagulation - no bleeding issues    6. Chronic diastolic dysfunction - symptoms stable. Not on diuretic   Current medicines are reviewed with the patient today.  The patient does not have concerns regarding medicines other than what has been noted above.  The following changes have been made:  See above.  Labs/ tests ordered today include:    Orders Placed This Encounter  Procedures  . MYOCARDIAL PERFUSION IMAGING   Lexiscan myovue   Disposition:   FU  Dr Rayann Heman for PAF and with me in 6 months if myovue normal or low risk    Patient is agreeable to this plan and will call if any problems develop in the interim.   Signed: [name], MD  04/28/2020 4:47 PM  [facility] [address] [place], [place]  [address] Phone: [phone] Fax: [phone]

## 2020-04-26 ENCOUNTER — Ambulatory Visit: Payer: Medicare Other | Admitting: Cardiovascular Disease

## 2020-04-26 ENCOUNTER — Other Ambulatory Visit: Payer: Self-pay | Admitting: Family Medicine

## 2020-04-26 DIAGNOSIS — E039 Hypothyroidism, unspecified: Secondary | ICD-10-CM

## 2020-04-28 ENCOUNTER — Other Ambulatory Visit: Payer: Self-pay

## 2020-04-28 ENCOUNTER — Encounter: Payer: Self-pay | Admitting: Cardiovascular Disease

## 2020-04-28 ENCOUNTER — Ambulatory Visit: Payer: Medicare Other | Admitting: Cardiovascular Disease

## 2020-04-28 VITALS — BP 130/84 | HR 77 | Ht 65.0 in | Wt 203.0 lb

## 2020-04-28 DIAGNOSIS — R079 Chest pain, unspecified: Secondary | ICD-10-CM | POA: Diagnosis not present

## 2020-04-28 DIAGNOSIS — I480 Paroxysmal atrial fibrillation: Secondary | ICD-10-CM | POA: Diagnosis not present

## 2020-04-28 NOTE — Progress Notes (Signed)
Received notification from [place] regarding approval for OZEMPIC 2MG /1.5ML. Patient assistance approved from 04/18/2020 to 02/25/2021. Medication will ship to [facility]. Estimated delivery date of 05/09/20.  Phone: [phone]

## 2020-04-28 NOTE — Patient Instructions (Addendum)
Medication Instructions:  *If you need a refill on your cardiac medications before your next appointment, please call your pharmacy*  Lab Work: If you have labs (blood work) drawn today and your tests are completely normal, you will receive your results only by: Marland Kitchen MyChart Message (if you have MyChart) OR . A paper copy in the mail If you have any lab test that is abnormal or we need to change your treatment, we will call you to review the results.  Testing/Procedures: Your physician has requested that you have a lexiscan myoview. For further information please visit [email or web address]. Please follow instruction sheet, as given.  Follow-Up: At [facility], you and your health needs are our priority.  As part of our continuing mission to provide you with exceptional heart care, we have created designated Provider Care Teams.  These Care Teams include your primary Cardiologist (physician) and Advanced Practice Providers (APPs -  Physician Assistants and Nurse Practitioners) who all work together to provide you with the care you need, when you need it.  We recommend signing up for the patient portal called "MyChart".  Sign up information is provided on this After Visit Summary.  MyChart is used to connect with patients for Virtual Visits (Telemedicine).  Patients are able to view lab/test results, encounter notes, upcoming appointments, etc.  Non-urgent messages can be sent to your provider as well.   To learn more about what you can do with MyChart, go to [email or web address].    Your next appointment:   6 month(s)  The format for your next appointment:   In Person  Provider:   You may see [name], MD or one of the following Advanced Practice Providers on your designated Care Team:    Kathyrn Drown, NP  Your physician recommends that you schedule a follow-up appointment after stress test with Dr. Rayann Heman for PAF.

## 2020-05-04 ENCOUNTER — Telehealth: Payer: Self-pay

## 2020-05-04 NOTE — Telephone Encounter (Signed)
-----   Message from [place] Pink, [place] sent at 05/03/2020 10:39 AM EST ----- Regarding: Patient meds Patient's ozempic arrived this morning, she may pick it up at her convenience.

## 2020-05-04 NOTE — Telephone Encounter (Signed)
Pt informed. Pt will pick up today or tomorrow. Ottis Stain, CMA

## 2020-05-09 ENCOUNTER — Telehealth (HOSPITAL_COMMUNITY): Payer: Self-pay

## 2020-05-09 NOTE — Telephone Encounter (Signed)
Spoke with the patient, detailed instructions given. She stated that she would be here for her test. Asked to cll back with any questions. S.Williams EMTP

## 2020-05-10 ENCOUNTER — Ambulatory Visit (HOSPITAL_COMMUNITY): Payer: Medicare Other | Attending: Internal Medicine

## 2020-05-10 ENCOUNTER — Other Ambulatory Visit: Payer: Self-pay

## 2020-05-10 DIAGNOSIS — R079 Chest pain, unspecified: Secondary | ICD-10-CM | POA: Diagnosis present

## 2020-05-10 DIAGNOSIS — I480 Paroxysmal atrial fibrillation: Secondary | ICD-10-CM | POA: Diagnosis present

## 2020-05-10 LAB — MYOCARDIAL PERFUSION IMAGING
LV dias vol: 83 mL (ref 46–106)
LV sys vol: 29 mL
Peak HR: 80 {beats}/min
Rest HR: 66 {beats}/min
SDS: 1
SRS: 0
SSS: 1
TID: 1.1

## 2020-05-10 MED ORDER — TECHNETIUM TC 99M TETROFOSMIN IV KIT
31.2000 | PACK | Freq: Once | INTRAVENOUS | Status: AC | PRN
  Administered 2020-05-10: 31.2 via INTRAVENOUS
  Filled 2020-05-10: qty 32

## 2020-05-10 MED ORDER — REGADENOSON 0.4 MG/5ML IV SOLN
0.4000 mg | Freq: Once | INTRAVENOUS | Status: AC
  Administered 2020-05-10: 0.4 mg via INTRAVENOUS

## 2020-05-10 MED ORDER — TECHNETIUM TC 99M TETROFOSMIN IV KIT
10.9000 | PACK | Freq: Once | INTRAVENOUS | Status: AC | PRN
  Administered 2020-05-10: 10.9 via INTRAVENOUS
  Filled 2020-05-10: qty 11

## 2020-05-12 ENCOUNTER — Ambulatory Visit: Payer: Medicare Other | Admitting: Podiatry

## 2020-05-18 ENCOUNTER — Ambulatory Visit: Payer: Medicare Other | Admitting: Internal Medicine

## 2020-05-18 DIAGNOSIS — I480 Paroxysmal atrial fibrillation: Secondary | ICD-10-CM

## 2020-05-18 DIAGNOSIS — I10 Essential (primary) hypertension: Secondary | ICD-10-CM

## 2020-05-18 DIAGNOSIS — I4892 Unspecified atrial flutter: Secondary | ICD-10-CM

## 2020-05-18 DIAGNOSIS — G4733 Obstructive sleep apnea (adult) (pediatric): Secondary | ICD-10-CM

## 2020-05-25 ENCOUNTER — Other Ambulatory Visit: Payer: Self-pay

## 2020-05-25 ENCOUNTER — Encounter: Payer: Self-pay | Admitting: Internal Medicine

## 2020-05-25 ENCOUNTER — Ambulatory Visit: Payer: Medicare Other | Admitting: Internal Medicine

## 2020-05-25 VITALS — BP 130/72 | HR 80 | Ht 65.0 in | Wt 201.0 lb

## 2020-05-25 DIAGNOSIS — I480 Paroxysmal atrial fibrillation: Secondary | ICD-10-CM

## 2020-05-25 DIAGNOSIS — I4892 Unspecified atrial flutter: Secondary | ICD-10-CM

## 2020-05-25 DIAGNOSIS — R002 Palpitations: Secondary | ICD-10-CM

## 2020-05-25 DIAGNOSIS — I10 Essential (primary) hypertension: Secondary | ICD-10-CM

## 2020-05-25 DIAGNOSIS — D6869 Other thrombophilia: Secondary | ICD-10-CM

## 2020-05-25 DIAGNOSIS — G4733 Obstructive sleep apnea (adult) (pediatric): Secondary | ICD-10-CM | POA: Diagnosis not present

## 2020-05-25 NOTE — Patient Instructions (Addendum)
Medication Instructions:  Your physician recommends that you continue on your current medications as directed. Please refer to the Current Medication list given to you today.  Labwork: None ordered.  Testing/Procedures: None ordered.   Follow-Up: Your physician wants you to follow-up in: 07/20/20 at 9:45 am with Thompson Grayer, MD   Any Other Special Instructions Will Be Listed Below (If Applicable).  If you need a refill on your cardiac medications before your next appointment, please call your pharmacy.    Implantable Loop Recorder Placement  An implantable loop recorder is a small electronic device that is placed under the skin of your chest. The device records the electrical activity of your heart over a long period of time. Your health care provider can download these recordings to monitor your heart. You may need an implantable loop recorder if you have periods of abnormal heart activity (arrhythmias) or unexplained fainting (syncope). The recorder can be left in place for 1 year or longer. Tell a health care provider about:  Any allergies you have.  All medicines you are taking, including vitamins, herbs, eye drops, creams, and over-the-counter medicines.  Any problems you or family members have had with anesthetic medicines.  Any blood disorders you have.  Any surgeries you have had.  Any medical conditions you have.  Whether you are pregnant or may be pregnant. What are the risks? Generally, this is a safe procedure. However, problems may occur, including:  Infection.  Bleeding.  Allergic reactions to anesthetic medicines.  Damage to nerves or blood vessels.  Failure of the device to work. This could require another surgery to replace it. What happens before the procedure?  You may have a physical exam, blood tests, and imaging tests of your heart, such as a chest X-ray.  Follow instructions from your health care provider about eating or drinking  restrictions.  Ask your health care provider about: ? Changing or stopping your regular medicines. This is especially important if you are taking diabetes medicines or blood thinners. ? Taking medicines such as aspirin and ibuprofen. These medicines can thin your blood. Do not take these medicines unless your health care provider tells you to take them. ? Taking over-the-counter medicines, vitamins, herbs, and supplements.  Ask your health care provider how your surgical site will be marked or identified.  Ask your health care provider what steps will be taken to help prevent infection. These may include: ? Removing hair at the surgery site. ? Washing skin with a germ-killing soap.  Plan to have someone take you home from the hospital or clinic.  Plan to have a responsible adult care for you for at least 24 hours after you leave the hospital or clinic. This is important.  Do not use any products that contain nicotine or tobacco, such as cigarettes and e-cigarettes. If you need help quitting, ask your health care provider.   What happens during the procedure?  An IV will be inserted into one of your veins.  You may be given one or more of the following: ? A medicine to help you relax (sedative). ? A medicine to numb the area (local anesthetic).  A small incision will be made on the left side of your upper chest.  A pocket will be created under your skin.  The device will be placed in the pocket.  The incision will be closed with stitches (sutures) or adhesive strips.  A bandage (dressing) will be placed over the incision. The procedure may vary among health  care providers and hospitals. What happens after the procedure?  Your blood pressure, heart rate, breathing rate, and blood oxygen level will be monitored until you leave the hospital or clinic.  You may be able to go home on the day of your surgery. Before you go home: ? Your health care provider will program your  recorder. ? You will learn how to trigger your device with a handheld activator. ? You will learn how to send recordings to your health care provider. ? You will get an ID card for your device, and you will be told when to use it.  Do not drive for 24 hours if you were given a sedative during your procedure. Summary  An implantable loop recorder is a small electronic device that is placed under the skin of your chest to monitor your heart over a long period of time.  The recorder can be left in place for 1 year or longer.  Plan to have someone take you home from the hospital or clinic. This information is not intended to replace advice given to you by your health care provider. Make sure you discuss any questions you have with your health care provider. Document Revised: 11/03/2019 Document Reviewed: 03/30/2017 Elsevier Patient Education  2021 [employer].

## 2020-05-25 NOTE — Progress Notes (Signed)
Electrophysiology Office Note   Date:  05/25/2020   ID:  Courtney Pena, DOB August 09, 1940, MRN [id number]  PCP:  [name], MD  Cardiologist:  Dr Johnsie Cancel Primary Electrophysiologist: Thompson Grayer, MD    CC: afib   History of Present Illness: Courtney Pena is a 80 y.o. female who presents today for electrophysiology evaluation.   She is referred by Dr Johnsie Cancel for EP consultation.  I saw her previously in 2018 but have not seen her since that time.   She has had afib for at least 7 years.  She has a h/o morbid obesity (previously weighed 309 lbs) and OSA for which she was followed by Dr Erik Obey.  She tried flecainide previously but did not tolerate this due to dizziness. She has chronic SOB. Over the past few months, she reports increasing frequency and duration of her palpitations.  Today, she denies symptoms of chest pain, shortness of breath, orthopnea, PND, lower extremity edema, claudication, dizziness, presyncope, syncope, bleeding, or neurologic sequela. The patient is tolerating medications without difficulties and is otherwise without complaint today.    Past Medical History:  Diagnosis Date  . Allergy   . Anemia due to GI blood loss 08/12/2011  . Arthritis   . Atrial flutter ([place]) 12-07-2010   converted in ED with 300 mg flecainide  . CAD (coronary artery disease)    a. mild per cath in 2004;  b. nonischemic Myoview in March 2012;  c. Lex MV 1/14:  EF 66%, no ischemia  . Cataract   . Chronic anticoagulation - Xarelto started 07/07/2015, CHADS2CVASC=5 07/07/2015  . Clotting disorder ([place])   . Diastolic CHF ([place]) 6/75/9163  . External hemorrhoids 06/07/2010  . Gastric antral vascular ectasia    source for gi bleed in 07/2011 - Xarelto stopped  . Gastritis   . GERD (gastroesophageal reflux disease)   . Hiatal hernia   . Hyperlipidemia   . Hypertension   . Hypothyroidism   . Neuromuscular disorder ([place])   . Obesity   . Paroxysmal atrial  fibrillation ([facility])   . Personal history of colonic polyps 06/06/2009   cecal polyp  . Sleep apnea    does not use CPAP  . 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 Ulcer    Past Surgical History:  Procedure Laterality Date  . BREAST BIOPSY Left   . BREAST EXCISIONAL BIOPSY Left    benign  . CARDIAC CATHETERIZATION  1999&2004  . CARPAL TUNNEL RELEASE Bilateral 2003  . CATARACT EXTRACTION Bilateral   . COLONOSCOPY    . ESOPHAGOGASTRODUODENOSCOPY  08/13/2011   Procedure: ESOPHAGOGASTRODUODENOSCOPY (EGD);  Surgeon: Gatha Mayer, MD;  Location: [facility] ENDOSCOPY;  Service: Endoscopy;  Laterality: N/A;  . POLYPECTOMY       Current Outpatient Medications  Medication Sig Dispense Refill  . acetaminophen (TYLENOL) 650 MG CR tablet Take 650 mg by mouth every 8 (eight) hours as needed for pain.    Marland Kitchen allopurinol (ZYLOPRIM) 100 MG tablet Take 100 mg by mouth daily.    Marland Kitchen apixaban (ELIQUIS) 5 MG TABS tablet Take 1 tablet (5 mg total) by mouth 2 (two) times daily. 42 tablet 0  . Apoaequorin (PREVAGEN PO) Take 1 tablet by mouth.    . blood glucose meter kit and supplies Dispense based on patient and insurance preference. Use up to four times daily as directed. (FOR ICD-10 E10.9, E11.9). 1 each 0  . cetirizine (ZYRTEC) 10  MG tablet Take 10 mg by mouth as needed for allergies. Takes 10 mg as needed for sinus trouble    . Cholecalciferol (VITAMIN D3) 2000 units TABS Take 2 capsules by mouth daily.    . Coenzyme Q10 (COQ10) 100 MG CAPS Take 100 mEq by mouth daily.     . diclofenac Sodium (VOLTAREN) 1 % GEL Apply topically as needed (pain).    Marland Kitchen diltiazem (CARDIZEM CD) 360 MG 24 hr capsule TAKE 1 CAPSULE BY MOUTH  DAILY 90 capsule 3  . diltiazem (CARDIZEM) 30 MG tablet Take 1 tablet (30 mg total) by mouth every 4 (four) hours as needed (elevated HR as long as BP > 100). 30 tablet 6  . ferrous sulfate 325 (65 FE) MG EC tablet Take 650 mg by mouth daily.     Marland Kitchen  glucose blood (ONETOUCH ULTRA) test strip 1 Container by Other route every morning.    Marland Kitchen glucose blood test strip Use to test blood sugar 3 times daily. 300 each 3  . Insulin Pen Needle 31G X 8 MM MISC Use with pen to inject once daily. 100 each 3  . Lancets (ONETOUCH ULTRASOFT) lancets Use to check blood sugar daily. 100 each 3  . leflunomide (ARAVA) 10 MG tablet Take 10 mg by mouth daily.    Marland Kitchen levothyroxine (SYNTHROID) 125 MCG tablet Take 1 tablet (125 mcg total) by mouth every morning. 30 minutes before food 90 tablet 3  . metFORMIN (GLUCOPHAGE-XR) 500 MG 24 hr tablet TAKE 4 TABLETS BY MOUTH  DAILY 360 tablet 3  . metoprolol succinate (TOPROL-XL) 100 MG 24 hr tablet TAKE 1 TABLET BY MOUTH 2  TIMES DAILY. TAKE WITH OR  IMMEDIATELY FOLLOWING A  MEAL. 180 tablet 1  . Multiple Vitamin (MULTI-VITAMINS) TABS Take daily by mouth.    . nitroGLYCERIN (NITROSTAT) 0.4 MG SL tablet Place 1 tablet (0.4 mg total) under the tongue every 5 (five) minutes as needed for chest pain (Call 911 if chest pain after three doses). 25 tablet 3  . omeprazole (PRILOSEC) 40 MG capsule TAKE 1 CAPSULE BY MOUTH  DAILY 90 capsule 3  . pregabalin (LYRICA) 50 MG capsule Take 1 capsule (50 mg total) by mouth 2 (two) times daily. 60 capsule 2  . rosuvastatin (CRESTOR) 10 MG tablet TAKE 1 TABLET BY MOUTH EVERY DAY 90 tablet 3  . Semaglutide, 1 MG/DOSE, 2 MG/1.5ML SOPN Inject 1 mg into the skin once a week. 9 mL 3  . Semaglutide,0.25 or 0.5MG/DOS, (OZEMPIC, 0.25 OR 0.5 MG/DOSE,) 2 MG/1.5ML SOPN Inject 0.5 mg into the skin once a week. 1.5 mL 0  . triamcinolone cream (KENALOG) 0.1 % Apply 1 application topically as needed (dermatitis).     No current facility-administered medications for this visit.    Allergies:   Actos [pioglitazone], Lisinopril, Nsaids, Penicillins, Sulfamethoxazole, Flecainide, Hctz [hydrochlorothiazide], Iodinated diagnostic agents, Jardiance [empagliflozin], Statins, Gabapentin, Losartan potassium, and Zetia  [ezetimibe]   Social History:  The patient  reports that she quit smoking about 27 years ago. Her smoking use included cigarettes. She has a 60.00 pack-year smoking history. She has never used smokeless tobacco. She reports that she does not drink alcohol and does not use drugs.   Family History:  The patient's  family history includes Colon polyps in her daughter; Diabetes in her daughter and maternal grandfather; Heart disease in her father; Hypertension in her mother; Other in her mother; Thyroid disease in her daughter.    ROS:  Please see the history  of present illness.   All other systems are personally reviewed and negative.    PHYSICAL EXAM: VS:  BP 130/72   Pulse 80   Ht '5\' 5"'  (1.651 m)   Wt 201 lb (91.2 kg)   SpO2 97%   BMI 33.45 kg/m  , BMI Body mass index is 33.45 kg/m. GEN: Well nourished, well developed, in no acute distress HEENT: normal Neck: no JVD, carotid bruits, or masses Cardiac: RRR; no murmurs, rubs, or gallops,no edema  Respiratory:  clear to auscultation bilaterally, normal work of breathing GI: soft, nontender, nondistended, + BS MS: no deformity or atrophy Skin: warm and dry  Neuro:  Strength and sensation are intact Psych: euthymic mood, full affect  EKG:  EKG is ordered today. The ekg ordered today is personally reviewed and shows sinus rhythm   Recent Labs: 12/18/2019: ALT 14; B Natriuretic Peptide 49.2 03/21/2020: BUN 18; Creatinine, Ser 1.07; Hemoglobin 10.6; Platelets 236; Potassium 4.8; Sodium 139; TSH 5.850  personally reviewed   Lipid Panel     Component Value Date/Time   CHOL 129 05/25/2019 0954   TRIG 128 05/25/2019 0954   HDL 50 05/25/2019 0954   CHOLHDL 2.6 05/25/2019 0954   CHOLHDL 4.7 01/27/2015 1238   VLDL 28 01/27/2015 1238   LDLCALC 56 05/25/2019 0954   LDLDIRECT 62 11/24/2018 1211   LDLDIRECT 178 (H) 05/12/2015 1229   personally reviewed   Wt Readings from Last 3 Encounters:  05/25/20 201 lb (91.2 kg)  05/10/20 203  lb (92.1 kg)  04/28/20 203 lb (92.1 kg)      Other studies personally reviewed: Additional studies/ records that were reviewed today include: my prior notes,  Dr Mariana Arn notes  Review of the above records today demonstrates: as above  Echo 12/18/19- EF 65%, mild LVH, small pericardial effusion,   ASSESSMENT AND PLAN:  1.  Paroxysmal atrial fibrillation/ atrial flutter The patient has symptomatic, recurrent atrial fibrillation and atrial flutter. she has failed medical therapy with flecainide. Chads2vasc score is 6.  she is anticoagulated with eliquis . Therapeutic strategies for afib including medicine (tikosyn, sotalol) and ablation were discussed in detail with the patient today. Risk, benefits, and alternatives to each approach were discussed.  She is concerned about risks of medicine and ablation.  She also worries that her palpitations may not be afib.  After long discussion, I have offered ILR as an option to further characterize her palpitations and to further manage her afib.  Risks and benefits to ILR were discussed she wishes to return to have this implanted in 6 weeks or so (not ready today).  Risks, benefits and potential toxicities for medications prescribed and/or refilled reviewed with patient today.   2. HTN Stable No change required today  3. Overweight Body mass index is 33.45 kg/m. Lifestyle modification is advised  4. OSA Stable No change required today   Follow-up:  End of May for ILR implant (per patient request)    Signed, Thompson Grayer, MD  05/25/2020 3:30 PM     [address] [place] [address] [phone] (office) [phone] (fax)

## 2020-06-17 ENCOUNTER — Telehealth: Payer: Self-pay | Admitting: Internal Medicine

## 2020-06-17 NOTE — Telephone Encounter (Signed)
New message   Pt is calling because she has some questions regarding afib and traveling and the ILR

## 2020-06-20 NOTE — Telephone Encounter (Signed)
Patient called with questions concerning travel with loop recorder in place. Education done that air travel is fine and on monitoring procedures. Patient reports feeling her heart rate being irregular on more than one occasion over the past 2 weeks and was concerned that she may need to move ILR implant date up from 07/20/20. Patient is on Eliquis and diltiazem CD. Informed there may not be a date to schedule her before 07/20/20 and that it would be fine to wait to implant the ILR. Questions answered and patient reassured.

## 2020-06-22 ENCOUNTER — Other Ambulatory Visit: Payer: Self-pay

## 2020-06-22 ENCOUNTER — Ambulatory Visit (INDEPENDENT_AMBULATORY_CARE_PROVIDER_SITE_OTHER): Payer: Medicare Other

## 2020-06-22 DIAGNOSIS — E1142 Type 2 diabetes mellitus with diabetic polyneuropathy: Secondary | ICD-10-CM | POA: Diagnosis not present

## 2020-06-22 DIAGNOSIS — M2042 Other hammer toe(s) (acquired), left foot: Secondary | ICD-10-CM | POA: Diagnosis not present

## 2020-06-22 DIAGNOSIS — M2041 Other hammer toe(s) (acquired), right foot: Secondary | ICD-10-CM | POA: Diagnosis not present

## 2020-06-22 NOTE — Progress Notes (Signed)
Patient in today to pick-up diabetic shoes. Shoes were tried on and patient is satisfied with the fit of the shoes. The break-in process was explained to the patient in detail. Advised patient to call the office with any questions,comments or concerns. Also advised patient to keep scheduled appointments with provider. Patient verbalized understand.

## 2020-07-14 ENCOUNTER — Encounter: Payer: Self-pay | Admitting: Family Medicine

## 2020-07-14 ENCOUNTER — Ambulatory Visit: Payer: Medicare Other | Admitting: Family Medicine

## 2020-07-14 ENCOUNTER — Other Ambulatory Visit: Payer: Self-pay

## 2020-07-14 VITALS — BP 132/62 | HR 66 | Ht 65.0 in | Wt 198.2 lb

## 2020-07-14 DIAGNOSIS — Z7901 Long term (current) use of anticoagulants: Secondary | ICD-10-CM | POA: Diagnosis not present

## 2020-07-14 DIAGNOSIS — D500 Iron deficiency anemia secondary to blood loss (chronic): Secondary | ICD-10-CM

## 2020-07-14 DIAGNOSIS — E1122 Type 2 diabetes mellitus with diabetic chronic kidney disease: Secondary | ICD-10-CM

## 2020-07-14 DIAGNOSIS — E039 Hypothyroidism, unspecified: Secondary | ICD-10-CM

## 2020-07-14 DIAGNOSIS — E7800 Pure hypercholesterolemia, unspecified: Secondary | ICD-10-CM | POA: Diagnosis not present

## 2020-07-14 DIAGNOSIS — I480 Paroxysmal atrial fibrillation: Secondary | ICD-10-CM

## 2020-07-14 DIAGNOSIS — E559 Vitamin D deficiency, unspecified: Secondary | ICD-10-CM

## 2020-07-14 LAB — POCT GLYCOSYLATED HEMOGLOBIN (HGB A1C): HbA1c, POC (controlled diabetic range): 6.3 % (ref 0.0–7.0)

## 2020-07-14 NOTE — Assessment & Plan Note (Signed)
Recheck lipids on rosuvastatin.

## 2020-07-14 NOTE — Assessment & Plan Note (Signed)
Recheck level - does she need continued treatment?

## 2020-07-14 NOTE — Assessment & Plan Note (Signed)
Having episodes but denies syncope, lightheadedness or CP

## 2020-07-14 NOTE — Assessment & Plan Note (Signed)
Recheck TSH 

## 2020-07-14 NOTE — Patient Instructions (Signed)
Stop the glipizide.  It is more likely to cause harm than good. Great job on weight and on diabetes control. I will call with blood test results.   Traveling is a SMALL risk.  Is seeing your sister and attending your pastor's wedding worth the risk.   You can take your 2nd covid booster at any time.  Remember, you will likely feel kinda bad for a day or two after.

## 2020-07-14 NOTE — Assessment & Plan Note (Signed)
Recheck CBC and ferritan

## 2020-07-14 NOTE — Assessment & Plan Note (Signed)
Puts her at continued risk for GI blood loss.

## 2020-07-14 NOTE — Progress Notes (Signed)
    SUBJECTIVE:   CHIEF COMPLAINT / HPI:   FU multiple problems. 1. Obesity.  Below 200lbs!!  Continues to work on diet and exercise.  Has lost over 100 lbs from her max weight.  We celebrated a nice milestone. 2. DM.  Great control with A1C=6.3 today.  No hypoglycemic spells.  She had restarted glipizide on her own. Told stop due to risk of hypoglycemia. 3. Due for 2nd COVID booster.  Will defer until she has a time where she could be sick for a few days. 4. PAF on eliquis.  Followed by Dr. Rayann Heman.  He plans xyopatch and possible ablation due to more frequent episodes.  She wonders if it is safe to travel. Wants to visit sister in [place] and attend pastor's wedding in [facility] 5. Chronic blood loss on apixaban.  Recently stable.  Due for recheck.   6. Due for lipids. 7. Went back to previous 112 microgram levothyroxine dose because 125 made her feel shakey.     OBJECTIVE:   BP 132/62   Pulse 66   Ht 5\' 5"  (1.651 m)   Wt 198 lb 3.2 oz (89.9 kg)   SpO2 96%   BMI 32.98 kg/m   Cardiac RRR without m or g Lungs clear   ASSESSMENT/PLAN:   Anemia due to GI blood loss Recheck CBC and ferritan  Chronic anticoag - Xarelto 05//2017, CHADS2CVASC=5 Eliquis 9/20 Puts her at continued risk for GI blood loss.  HYPERCHOLESTEROLEMIA Recheck lipids on rosuvastatin.  Vitamin D deficiency Recheck level - does she need continued treatment?  Type 2 diabetes mellitus with diabetic chronic kidney disease ([place]) Great control with weight loss and current med regimen.  Hypothyroidism Recheck TSH  Paroxysmal atrial fibrillation ([place]) Having episodes but denies syncope, lightheadedness or CP     Courtney Resides, MD [place]

## 2020-07-14 NOTE — Assessment & Plan Note (Signed)
Great control with weight loss and current med regimen.

## 2020-07-15 LAB — CBC
Hematocrit: 32.1 % — ABNORMAL LOW (ref 34.0–46.6)
Hemoglobin: 10.3 g/dL — ABNORMAL LOW (ref 11.1–15.9)
MCH: 27.5 pg (ref 26.6–33.0)
MCHC: 32.1 g/dL (ref 31.5–35.7)
MCV: 86 fL (ref 79–97)
Platelets: 273 10*3/uL (ref 150–450)
RBC: 3.75 x10E6/uL — ABNORMAL LOW (ref 3.77–5.28)
RDW: 14.4 % (ref 11.7–15.4)
WBC: 5.3 10*3/uL (ref 3.4–10.8)

## 2020-07-15 LAB — FERRITIN: Ferritin: 29 ng/mL (ref 15–150)

## 2020-07-15 LAB — LIPID PANEL
Chol/HDL Ratio: 2.4 ratio (ref 0.0–4.4)
Cholesterol, Total: 144 mg/dL (ref 100–199)
HDL: 60 mg/dL (ref >39)
LDL Chol Calc (NIH): 69 mg/dL (ref 0–99)
Triglycerides: 77 mg/dL (ref 0–149)
VLDL Cholesterol Cal: 15 mg/dL (ref 5–40)

## 2020-07-15 LAB — VITAMIN D 25 HYDROXY (VIT D DEFICIENCY, FRACTURES): Vit D, 25-Hydroxy: 72.5 ng/mL (ref 30.0–100.0)

## 2020-07-20 ENCOUNTER — Encounter: Payer: Self-pay | Admitting: Internal Medicine

## 2020-07-20 ENCOUNTER — Ambulatory Visit: Payer: Medicare Other | Admitting: Internal Medicine

## 2020-07-20 ENCOUNTER — Other Ambulatory Visit: Payer: Self-pay

## 2020-07-20 VITALS — BP 144/82 | HR 78 | Ht 65.0 in | Wt 198.2 lb

## 2020-07-20 DIAGNOSIS — I480 Paroxysmal atrial fibrillation: Secondary | ICD-10-CM | POA: Diagnosis not present

## 2020-07-20 DIAGNOSIS — I10 Essential (primary) hypertension: Secondary | ICD-10-CM

## 2020-07-20 DIAGNOSIS — I4892 Unspecified atrial flutter: Secondary | ICD-10-CM

## 2020-07-20 DIAGNOSIS — G4733 Obstructive sleep apnea (adult) (pediatric): Secondary | ICD-10-CM | POA: Diagnosis not present

## 2020-07-20 HISTORY — PX: OTHER SURGICAL HISTORY: SHX169

## 2020-07-20 NOTE — Progress Notes (Signed)
PCP: [name], MD Primary Cardiologist: Dr Johnsie Cancel Primary EP: Dr Rayann Heman  Courtney Pena is a 80 y.o. female who presents today for routine electrophysiology followup.  Since last being seen in our clinic, the patient reports doing reasonably well.  She has palpitations of unclear etiology.  Today, she denies symptoms of  chest pain, shortness of breath,  lower extremity edema, dizziness, presyncope, or syncope.  The patient is otherwise without complaint today.   Past Medical History:  Diagnosis Date  . Allergy   . Anemia due to GI blood loss 08/12/2011  . Arthritis   . Atrial flutter ([place]) 12-07-2010   converted in ED with 300 mg flecainide  . CAD (coronary artery disease)    a. mild per cath in 2004;  b. nonischemic Myoview in March 2012;  c. Lex MV 1/14:  EF 66%, no ischemia  . Cataract   . Chronic anticoagulation - Xarelto started 07/07/2015, CHADS2CVASC=5 07/07/2015  . Clotting disorder ([place])   . Diastolic CHF ([place]) 1/61/0960  . External hemorrhoids 06/07/2010  . Gastric antral vascular ectasia    source for gi bleed in 07/2011 - Xarelto stopped  . Gastritis   . GERD (gastroesophageal reflux disease)   . Hiatal hernia   . Hyperlipidemia   . Hypertension   . Hypothyroidism   . Neuromuscular disorder ([place])   . Obesity   . Paroxysmal atrial fibrillation (HCC)   . Personal history of colonic polyps 06/06/2009   cecal polyp  . Sleep apnea    does not use CPAP  . 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 Ulcer    Past Surgical History:  Procedure Laterality Date  . BREAST BIOPSY Left   . BREAST EXCISIONAL BIOPSY Left    benign  . CARDIAC CATHETERIZATION  1999&2004  . CARPAL TUNNEL RELEASE Bilateral 2003  . CATARACT EXTRACTION Bilateral   . COLONOSCOPY    . ESOPHAGOGASTRODUODENOSCOPY  08/13/2011   Procedure: ESOPHAGOGASTRODUODENOSCOPY (EGD);  Surgeon: Gatha Mayer, MD;  Location: [facility] ENDOSCOPY;  Service:  Endoscopy;  Laterality: N/A;  . POLYPECTOMY      ROS- all systems are reviewed and negatives except as per HPI above  Current Outpatient Medications  Medication Sig Dispense Refill  . acetaminophen (TYLENOL) 650 MG CR tablet Take 650 mg by mouth every 8 (eight) hours as needed for pain.    Marland Kitchen allopurinol (ZYLOPRIM) 100 MG tablet Take 100 mg by mouth daily.    Marland Kitchen apixaban (ELIQUIS) 5 MG TABS tablet Take 1 tablet (5 mg total) by mouth 2 (two) times daily. 42 tablet 0  . Apoaequorin (PREVAGEN PO) Take 1 tablet by mouth.    . blood glucose meter kit and supplies Dispense based on patient and insurance preference. Use up to four times daily as directed. (FOR ICD-10 E10.9, E11.9). 1 each 0  . cetirizine (ZYRTEC) 10 MG tablet Take 10 mg by mouth as needed for allergies. Takes 10 mg as needed for sinus trouble    . Cholecalciferol (VITAMIN D3) 2000 units TABS Take 2 capsules by mouth daily.    . Coenzyme Q10 (COQ10) 100 MG CAPS Take 100 mEq by mouth daily.     . diclofenac Sodium (VOLTAREN) 1 % GEL Apply topically as needed (pain).    Marland Kitchen diltiazem (CARDIZEM CD) 360 MG 24 hr capsule TAKE 1 CAPSULE BY MOUTH  DAILY 90 capsule 3  . diltiazem (CARDIZEM) 30 MG tablet Take 1  tablet (30 mg total) by mouth every 4 (four) hours as needed (elevated HR as long as BP > 100). 30 tablet 6  . ferrous sulfate 325 (65 FE) MG EC tablet Take 650 mg by mouth daily.     Marland Kitchen glucose blood (ONETOUCH ULTRA) test strip 1 Container by Other route every morning.    Marland Kitchen glucose blood test strip Use to test blood sugar 3 times daily. 300 each 3  . Insulin Pen Needle 31G X 8 MM MISC Use with pen to inject once daily. 100 each 3  . Lancets (ONETOUCH ULTRASOFT) lancets Use to check blood sugar daily. 100 each 3  . leflunomide (ARAVA) 10 MG tablet Take 10 mg by mouth daily.    Marland Kitchen levothyroxine (SYNTHROID) 112 MCG tablet Take 112 mcg by mouth daily before breakfast.    . metFORMIN (GLUCOPHAGE-XR) 500 MG 24 hr tablet TAKE 4 TABLETS BY MOUTH   DAILY 360 tablet 3  . metoprolol succinate (TOPROL-XL) 100 MG 24 hr tablet TAKE 1 TABLET BY MOUTH 2  TIMES DAILY. TAKE WITH OR  IMMEDIATELY FOLLOWING A  MEAL. 180 tablet 1  . Multiple Vitamin (MULTI-VITAMINS) TABS Take daily by mouth.    . nitroGLYCERIN (NITROSTAT) 0.4 MG SL tablet Place 1 tablet (0.4 mg total) under the tongue every 5 (five) minutes as needed for chest pain (Call 911 if chest pain after three doses). 25 tablet 3  . omeprazole (PRILOSEC) 40 MG capsule TAKE 1 CAPSULE BY MOUTH  DAILY 90 capsule 3  . pregabalin (LYRICA) 50 MG capsule Take 1 capsule (50 mg total) by mouth 2 (two) times daily. 60 capsule 2  . rosuvastatin (CRESTOR) 10 MG tablet TAKE 1 TABLET BY MOUTH EVERY DAY 90 tablet 3  . Semaglutide, 1 MG/DOSE, 2 MG/1.5ML SOPN Inject 1 mg into the skin once a week. 9 mL 3  . Semaglutide,0.25 or 0.5MG/DOS, (OZEMPIC, 0.25 OR 0.5 MG/DOSE,) 2 MG/1.5ML SOPN Inject 0.5 mg into the skin once a week. 1.5 mL 0  . triamcinolone cream (KENALOG) 0.1 % Apply 1 application topically as needed (dermatitis).     No current facility-administered medications for this visit.    Physical Exam: Vitals:   07/20/20 1008  BP: (!) 144/82  Pulse: 78  SpO2: 98%  Weight: 198 lb 3.2 oz (89.9 kg)  Height: _0  (1.651 m)    GEN- The patient is well appearing, alert and oriented x 3 today.   Head- normocephalic, atraumatic Eyes-  Sclera clear, conjunctiva pink Ears- hearing intact Oropharynx- clear Lungs- Clear to ausculation bilaterally, normal work of breathing Heart- Regular rate and rhythm, no murmurs, rubs or gallops, PMI not laterally displaced GI- soft, NT, ND, + BS Extremities- no clubbing, cyanosis, or edema  Wt Readings from Last 3 Encounters:  07/20/20 198 lb 3.2 oz (89.9 kg)  07/14/20 198 lb 3.2 oz (89.9 kg)  05/25/20 201 lb (91.2 kg)    EKG tracing ordered today is personally reviewed and shows sinus rhythm, PR 224 msec, LAD  Assessment and Plan:  1. Paroxysmal atrial  fibrillation/ atrial flutter The patient has palpitations of unclear etiology.  She does not wish for aggressive AF management until we better characterize her palpitations.  I would therefore advise implantation of an implantable loop recorder for long term arrhythmia monitoring.  Risks and benefits to ILR were discussed at length with the patient today, including but not limited to risks of bleeding and infection.  Extensive device education was performed.  Remote monitoring was  also discussed at length today.  The patient understands and wishes to proceed.  We will proceed at this time with ILR implantation.  If we find that her AF burden is increased,we would consider tikosyn vs ablation.  Continue eliquis for chads2vasc score of 6  2. HTN Continue current medicine regimen  3. Overweight Body mass index is 32.98 kg/m. Lifestyle modification is advised  4. OSA Continue current therapy  Thompson Grayer MD, [facility] 07/20/2020 10:23 AM      DESCRIPTION OF PROCEDURE:  Informed written consent was obtained.  The patient required no sedation for the procedure today.  The patients left chest was prepped and draped. Mapping over the patient's chest was performed to identify the appropriate ILR site.  This area was found to be the left parasternal region over the 3rd-4th intercostal space.  The skin overlying this region was infiltrated with lidocaine for local analgesia.  A 0.5-cm incision was made at the implant site.  A subcutaneous ILR pocket was fashioned using a combination of sharp and blunt dissection.  A Medtronic Reveal Linq model LNQ 22 (SN [id number]) implantable loop recorder was then placed into the pocket R waves were very prominent and measured > 0.2 mV. EBL<1 ml.  Steri- Strips and a sterile dressing were then applied.  There were no early apparent complications.     CONCLUSIONS:   1. Successful implantation of a Medtronic Reveal LINQ implantable loop recorder for evaluation of  palpitations and AF management  2. No early apparent complications.   Thompson Grayer MD, [facility] 07/20/2020 10:23 AM

## 2020-07-20 NOTE — Patient Instructions (Addendum)
Medication Instructions:  Your physician recommends that you continue on your current medications as directed. Please refer to the Current Medication list given to you today.  Labwork: None ordered.  Testing/Procedures: None ordered.  Follow-Up:  Your physician wants you to follow-up in: 10/21/20 at 2 pm with Dr. Rayann Heman.     Implantable Loop Recorder Placement, Care After This sheet gives you information about how to care for yourself after your procedure. Your health care provider may also give you more specific instructions. If you have problems or questions, contact your health care provider. What can I expect after the procedure? After the procedure, it is common to have:  Soreness or discomfort near the incision.  Some swelling or bruising near the incision.  Follow these instructions at home: Incision care  1.  Leave your outer dressing on for 24 hours.  After 24 hours you can remove your outer dressing and shower. 2. Leave adhesive strips in place. These skin closures may need to stay in place for 1-2 weeks. If adhesive strip edges start to loosen and curl up, you may trim the loose edges.  You may remove the strips if they have not fallen off after 2 weeks. 3. Check your incision area every day for signs of infection. Check for: a. Redness, swelling, or pain. b. Fluid or blood. c. Warmth. d. Pus or a bad smell. 4. Do not take baths, swim, or use a hot tub until your incision is completely healed. 5. If your wound site starts to bleed apply pressure.      If you have any questions/concerns please call the device clinic at [phone].  Activity  Return to your normal activities.  General instructions  Follow instructions from your health care provider about how to manage your implantable loop recorder and transmit the information. Learn how to activate a recording if this is necessary for your type of device.  Do not go through a metal detection gate, and do not  let someone hold a metal detector over your chest. Show your ID card.  Do not have an MRI unless you check with your health care provider first.  Take over-the-counter and prescription medicines only as told by your health care provider.  Keep all follow-up visits as told by your health care provider. This is important. Contact a health care provider if:  You have redness, swelling, or pain around your incision.  You have a fever.  You have pain that is not relieved by your pain medicine.  You have triggered your device because of fainting (syncope) or because of a heartbeat that feels like it is racing, slow, fluttering, or skipping (palpitations). Get help right away if you have:  Chest pain.  Difficulty breathing. Summary  After the procedure, it is common to have soreness or discomfort near the incision.  Change your dressing as told by your health care provider.  Follow instructions from your health care provider about how to manage your implantable loop recorder and transmit the information.  Keep all follow-up visits as told by your health care provider. This is important. This information is not intended to replace advice given to you by your health care provider. Make sure you discuss any questions you have with your health care provider. Document Released: 01/24/2015 Document Revised: 03/30/2017 Document Reviewed: 03/30/2017 Elsevier Patient Education  2020 [employer].

## 2020-08-08 ENCOUNTER — Telehealth: Payer: Self-pay | Admitting: *Deleted

## 2020-08-08 NOTE — Telephone Encounter (Signed)
Patient is calling for chronic foot pain,has an upcoming appointment on Friday, wanted to know what she can do to help with the pain before upcoming appointment. Please try to schedule for a sooner appointment if possible.

## 2020-08-12 ENCOUNTER — Other Ambulatory Visit: Payer: Self-pay

## 2020-08-12 ENCOUNTER — Ambulatory Visit: Payer: Medicare Other | Admitting: Podiatry

## 2020-08-12 DIAGNOSIS — M722 Plantar fascial fibromatosis: Secondary | ICD-10-CM

## 2020-08-12 DIAGNOSIS — R2689 Other abnormalities of gait and mobility: Secondary | ICD-10-CM

## 2020-08-17 ENCOUNTER — Encounter: Payer: Self-pay | Admitting: Family Medicine

## 2020-08-17 ENCOUNTER — Encounter: Payer: Self-pay | Admitting: Podiatry

## 2020-08-17 ENCOUNTER — Ambulatory Visit: Payer: Medicare Other | Admitting: Family Medicine

## 2020-08-17 ENCOUNTER — Other Ambulatory Visit: Payer: Self-pay

## 2020-08-17 VITALS — BP 125/78 | HR 75 | Wt 193.0 lb

## 2020-08-17 DIAGNOSIS — R109 Unspecified abdominal pain: Secondary | ICD-10-CM | POA: Diagnosis not present

## 2020-08-17 LAB — POCT URINALYSIS DIP (MANUAL ENTRY)
Blood, UA: NEGATIVE
Glucose, UA: NEGATIVE mg/dL
Nitrite, UA: NEGATIVE
Protein Ur, POC: 100 mg/dL — AB
Spec Grav, UA: >=1.03 — AB (ref 1.010–1.025)
Urobilinogen, UA: 0.2 E.U./dL
pH, UA: 5 (ref 5.0–8.0)

## 2020-08-17 NOTE — Patient Instructions (Signed)
I will call with urine results tomorrow. We will decide next steps at that time.

## 2020-08-17 NOTE — Progress Notes (Signed)
Subjective:  Patient ID: Courtney Pena, female    DOB: [birth year],  MRN: [id number]  Chief Complaint  Patient presents with   Foot Pain    PT stated that her foot pain has came back and is worse after sitting for a long time     80 y.o. female presents with the above complaint.  Patient presents with new complaint of bilateral heel pain.  She states that she is having pain to both of the feet.  She states that this has been going on for few weeks has progressive gotten worse.  She is a diabetic she is well-known to Dr. Milinda Pointer.  She also would like to do physical therapy to build up strength in her foot and lower extremity.  She denies any other acute complaint she would like to discuss treatment options for Planter fasciitis.  She experiences post static dyskinesia type of symptoms.   Review of Systems: Negative except as noted in the HPI. Denies N/V/F/Ch.  Past Medical History:  Diagnosis Date   Allergy    Anemia due to GI blood loss 08/12/2011   Arthritis    Atrial flutter ([place]) 12-07-2010   converted in ED with 300 mg flecainide   CAD (coronary artery disease)    a. mild per cath in 2004;  b. nonischemic Myoview in March 2012;  c. Lex MV 1/14:  EF 66%, no ischemia   Cataract    Chronic anticoagulation - Xarelto started 07/07/2015, CHADS2CVASC=5 07/07/2015   Clotting disorder (HCC)    Diastolic CHF ([place]) 2/83/1517   External hemorrhoids 06/07/2010   Gastric antral vascular ectasia    source for gi bleed in 07/2011 - Xarelto stopped   Gastritis    GERD (gastroesophageal reflux disease)    Hiatal hernia    Hyperlipidemia    Hypertension    Hypothyroidism    Neuromuscular disorder ([place])    Obesity    Paroxysmal atrial fibrillation (HCC)    Personal history of colonic polyps 06/06/2009   cecal polyp   Sleep apnea    does not use CPAP   Type 2 diabetes mellitus with diabetic chronic kidney disease ([place]) 04/25/2006       Ulcer     Current Outpatient Medications:     acetaminophen (TYLENOL) 650 MG CR tablet, Take 650 mg by mouth every 8 (eight) hours as needed for pain., Disp: , Rfl:    allopurinol (ZYLOPRIM) 100 MG tablet, Take 100 mg by mouth daily., Disp: , Rfl:    apixaban (ELIQUIS) 5 MG TABS tablet, Take 1 tablet (5 mg total) by mouth 2 (two) times daily., Disp: 42 tablet, Rfl: 0   Apoaequorin (PREVAGEN PO), Take 1 tablet by mouth., Disp: , Rfl:    blood glucose meter kit and supplies, Dispense based on patient and insurance preference. Use up to four times daily as directed. (FOR ICD-10 E10.9, E11.9)., Disp: 1 each, Rfl: 0   cetirizine (ZYRTEC) 10 MG tablet, Take 10 mg by mouth as needed for allergies. Takes 10 mg as needed for sinus trouble, Disp: , Rfl:    Cholecalciferol (VITAMIN D3) 2000 units TABS, Take 2 capsules by mouth daily., Disp: , Rfl:    Coenzyme Q10 (COQ10) 100 MG CAPS, Take 100 mEq by mouth daily. , Disp: , Rfl:    diclofenac Sodium (VOLTAREN) 1 % GEL, Apply topically as needed (pain)., Disp: , Rfl:    diltiazem (CARDIZEM CD) 360 MG 24 hr capsule, TAKE 1 CAPSULE BY MOUTH  DAILY, Disp: 90  capsule, Rfl: 3   diltiazem (CARDIZEM) 30 MG tablet, Take 1 tablet (30 mg total) by mouth every 4 (four) hours as needed (elevated HR as long as BP > 100)., Disp: 30 tablet, Rfl: 6   ferrous sulfate 325 (65 FE) MG EC tablet, Take 650 mg by mouth daily. , Disp: , Rfl:    glucose blood (ONETOUCH ULTRA) test strip, 1 Container by Other route every morning., Disp: , Rfl:    glucose blood test strip, Use to test blood sugar 3 times daily., Disp: 300 each, Rfl: 3   Insulin Pen Needle 31G X 8 MM MISC, Use with pen to inject once daily., Disp: 100 each, Rfl: 3   Lancets (ONETOUCH ULTRASOFT) lancets, Use to check blood sugar daily., Disp: 100 each, Rfl: 3   leflunomide (ARAVA) 10 MG tablet, Take 10 mg by mouth daily., Disp: , Rfl:    levothyroxine (SYNTHROID) 112 MCG tablet, Take 112 mcg by mouth daily before breakfast., Disp: , Rfl:    metFORMIN (GLUCOPHAGE-XR)  500 MG 24 hr tablet, TAKE 4 TABLETS BY MOUTH  DAILY, Disp: 360 tablet, Rfl: 3   metoprolol succinate (TOPROL-XL) 100 MG 24 hr tablet, TAKE 1 TABLET BY MOUTH 2  TIMES DAILY. TAKE WITH OR  IMMEDIATELY FOLLOWING A  MEAL., Disp: 180 tablet, Rfl: 1   Multiple Vitamin (MULTI-VITAMINS) TABS, Take daily by mouth., Disp: , Rfl:    nitroGLYCERIN (NITROSTAT) 0.4 MG SL tablet, Place 1 tablet (0.4 mg total) under the tongue every 5 (five) minutes as needed for chest pain (Call 911 if chest pain after three doses)., Disp: 25 tablet, Rfl: 3   omeprazole (PRILOSEC) 40 MG capsule, TAKE 1 CAPSULE BY MOUTH  DAILY, Disp: 90 capsule, Rfl: 3   pregabalin (LYRICA) 50 MG capsule, Take 1 capsule (50 mg total) by mouth 2 (two) times daily., Disp: 60 capsule, Rfl: 2   rosuvastatin (CRESTOR) 10 MG tablet, TAKE 1 TABLET BY MOUTH EVERY DAY, Disp: 90 tablet, Rfl: 3   Semaglutide, 1 MG/DOSE, 2 MG/1.5ML SOPN, Inject 1 mg into the skin once a week., Disp: 9 mL, Rfl: 3   Semaglutide,0.25 or 0.5MG /DOS, (OZEMPIC, 0.25 OR 0.5 MG/DOSE,) 2 MG/1.5ML SOPN, Inject 0.5 mg into the skin once a week., Disp: 1.5 mL, Rfl: 0   triamcinolone cream (KENALOG) 0.1 %, Apply 1 application topically as needed (dermatitis)., Disp: , Rfl:   Social History   Tobacco Use  Smoking Status Former   Packs/day: 2.00   Years: 30.00   Pack years: 60.00   Types: Cigarettes   Quit date: 02/26/1993   Years since quitting: 27.4  Smokeless Tobacco Never    Allergies  Allergen Reactions   Actos [Pioglitazone] Swelling    Swelling all over body and moonface   Lisinopril Swelling    Swelling, may have had some breathing involvement. Angioedema   Nsaids Other (See Comments)    Patient reports internal bleeding. Experienced with tolmetin.    Penicillins Shortness Of Breath and Swelling    Arm Swelling with Penicillin (Occurred in 1960s) Breathing - throat swelling with Amoxicillin (Occurred prior to 2002)   Sulfamethoxazole Hives and Itching    "welps all  over" immediately after dose   Flecainide Other (See Comments)    Blurry  vision   Hctz [Hydrochlorothiazide] Other (See Comments)    Gout   Iodinated Diagnostic Agents Itching    Pt. Developed mild itching after receiving IV cm; pt. Held; Dr. Keane Scrape recomended she take 50 mg of benadryl when  she goes home-if necessary; Dr Mickey Farber recommends benadryl prior to future exams requiring contrast media, but stated other doctors may recommend another premedication prep.   Jardiance [Empagliflozin] Other (See Comments)    Patient reported "it made her kidneys not work"  Patient resists retrial/alternative.   Statins Other (See Comments)    Muscle aches with multiple statins. Able to tolerate rosuvastatin.   Gabapentin Other (See Comments)    Caused dysphoria    Losartan Potassium Other (See Comments)    Lower extremity swelling   Zetia [Ezetimibe] Other (See Comments)    Cramps   Objective:  There were no vitals filed for this visit. There is no height or weight on file to calculate BMI. Constitutional Well developed. Well nourished.  Vascular Dorsalis pedis pulses palpable bilaterally. Posterior tibial pulses palpable bilaterally. Capillary refill normal to all digits.  No cyanosis or clubbing noted. Pedal hair growth normal.  Neurologic Normal speech. Oriented to person, place, and time. Epicritic sensation to light touch grossly present bilaterally.  Dermatologic Nails well groomed and normal in appearance. No open wounds. No skin lesions.  Orthopedic: Normal joint ROM without pain or crepitus bilaterally. No visible deformities. Tender to palpation at the calcaneal tuber bilaterally. No pain with calcaneal squeeze bilaterally. Ankle ROM diminished range of motion bilaterally. Silfverskiold Test: positive bilaterally.  Decreased in motor strength of anterior lateral and posterior compartment.  4 out of 5 with manual muscle testing of each of the above listed compartments.    Radiographs: Taken and reviewed. No acute fractures or dislocations. No evidence of stress fracture.  Plantar heel spur present. Posterior heel spur absent.   Assessment:   1. Antalgic gait   2. Plantar fasciitis of right foot   3. Plantar fasciitis of left foot    Plan:  Patient was evaluated and treated and all questions answered.  Plantar Fasciitis, bilaterally - XR reviewed as above.  - Educated on icing and stretching. Instructions given.  - Injection delivered to the plantar fascia as below. - DME: Plantar Fascial Brace - Pharmacologic management: None  Antalgic gait/generalized weakness both lower extremity -The patient the etiology of antalgic gait with decreased strength and various treatment options were discussed.  I believe patient will benefit from physical therapy.  Physical therapy prescription was dispensedFor general deconditioning of both lower extremity.  Procedure: Injection Tendon/Ligament Location: Bilateral plantar fascia at the glabrous junction; medial approach. Skin Prep: alcohol Injectate: 0.5 cc 0.5% marcaine plain, 0.5 cc of 1% Lidocaine, 0.5 cc kenalog 10. Disposition: Patient tolerated procedure well. Injection site dressed with a band-aid.  No follow-ups on file.

## 2020-08-18 ENCOUNTER — Encounter: Payer: Self-pay | Admitting: Family Medicine

## 2020-08-18 NOTE — Progress Notes (Signed)
    SUBJECTIVE:   CHIEF COMPLAINT / HPI:   Right flank pain radiating to right groin.  Generally, the patient has been feeling good.  However, over the last month, she has developed right flank pain which radiates to the right groin.  No change with eating or with BM.  Certain movements seem to make it worse.  No hx of kidney stone and has had previous CT Abd which have not demonstrated any stones.  Has had colon polyps in the past.  Last colonoscopy was 2 years ago.  No trauma or unusual activity.  No fever and does not feel sick.  She just hurts.  She feels good about her slow, continued wt loss.      OBJECTIVE:   BP 125/78   Pulse 75   Wt 193 lb (87.5 kg)   SpO2 99%   BMI 32.12 kg/m   VS and wt noted. Cardiac RRR without m or g Lungs clear Abd, Pain to palpation of right lower quadrent.  I could not differentiate by exam whether this was abd wall pain versus intra-abdominal pain.  ASSESSMENT/PLAN:   No problem-specific Assessment & Plan notes found for this encounter.     [name], MD [place]

## 2020-08-18 NOTE — Assessment & Plan Note (Signed)
UA is largely unremarkable.  Sent for culture.  Await urine culture results.  Broad diff dx.  My leading suspicion is musculoskeletal.  For now, I am keeping my mind open.

## 2020-08-20 LAB — URINE CULTURE

## 2020-08-23 ENCOUNTER — Ambulatory Visit (INDEPENDENT_AMBULATORY_CARE_PROVIDER_SITE_OTHER): Payer: Medicare Other

## 2020-08-23 DIAGNOSIS — I480 Paroxysmal atrial fibrillation: Secondary | ICD-10-CM

## 2020-08-23 LAB — CUP PACEART REMOTE DEVICE CHECK
Date Time Interrogation Session: 20220628094845
Implantable Pulse Generator Implant Date: 20220525

## 2020-09-02 ENCOUNTER — Other Ambulatory Visit: Payer: Self-pay

## 2020-09-02 ENCOUNTER — Ambulatory Visit: Payer: Medicare Other | Admitting: Podiatry

## 2020-09-02 ENCOUNTER — Ambulatory Visit (INDEPENDENT_AMBULATORY_CARE_PROVIDER_SITE_OTHER): Payer: Medicare Other

## 2020-09-02 DIAGNOSIS — M7730 Calcaneal spur, unspecified foot: Secondary | ICD-10-CM

## 2020-09-02 DIAGNOSIS — M722 Plantar fascial fibromatosis: Secondary | ICD-10-CM

## 2020-09-02 NOTE — Progress Notes (Signed)
G

## 2020-09-07 NOTE — Progress Notes (Signed)
Subjective:  Patient ID: Courtney Pena, female    DOB: 09-May-1940,  MRN: [id number]  Chief Complaint  Patient presents with   Plantar Fasciitis    PT stated that she is still having a Pena of pain     80 y.o. female presents with the above complaint.  Patient presents with bilateral follow-up of bilateral heel pain.  Patient states the left is worse than right side.  The right side is little bit better now.  She states that the injection did not help much.  The bracing helps a little bit.  She did has not gone to physical therapy she denies any other acute complaints.   Review of Systems: Negative except as noted in the HPI. Denies N/V/F/Ch.  Past Medical History:  Diagnosis Date   Allergy    Anemia due to GI blood loss 08/12/2011   Arthritis    Atrial flutter ([place]) 12-07-2010   converted in ED with 300 mg flecainide   CAD (coronary artery disease)    a. mild per cath in 2004;  b. nonischemic Myoview in March 2012;  c. Lex MV 1/14:  EF 66%, no ischemia   Cataract    Chronic anticoagulation - Xarelto started 07/07/2015, CHADS2CVASC=5 07/07/2015   Clotting disorder (HCC)    Diastolic CHF ([place]) 4/40/1027   External hemorrhoids 06/07/2010   Gastric antral vascular ectasia    source for gi bleed in 07/2011 - Xarelto stopped   Gastritis    GERD (gastroesophageal reflux disease)    Hiatal hernia    Hyperlipidemia    Hypertension    Hypothyroidism    Neuromuscular disorder ([place])    Obesity    Paroxysmal atrial fibrillation (HCC)    Personal history of colonic polyps 06/06/2009   cecal polyp   Sleep apnea    does not use CPAP   Type 2 diabetes mellitus with diabetic chronic kidney disease ([place]) 04/25/2006       Ulcer     Current Outpatient Medications:    acetaminophen (TYLENOL) 650 MG CR tablet, Take 650 mg by mouth every 8 (eight) hours as needed for pain., Disp: , Rfl:    allopurinol (ZYLOPRIM) 100 MG tablet, Take 100 mg by mouth daily., Disp: , Rfl:    apixaban  (ELIQUIS) 5 MG TABS tablet, Take 1 tablet (5 mg total) by mouth 2 (two) times daily., Disp: 42 tablet, Rfl: 0   Apoaequorin (PREVAGEN PO), Take 1 tablet by mouth., Disp: , Rfl:    blood glucose meter kit and supplies, Dispense based on patient and insurance preference. Use up to four times daily as directed. (FOR ICD-10 E10.9, E11.9)., Disp: 1 each, Rfl: 0   cetirizine (ZYRTEC) 10 MG tablet, Take 10 mg by mouth as needed for allergies. Takes 10 mg as needed for sinus trouble, Disp: , Rfl:    Cholecalciferol (VITAMIN D3) 2000 units TABS, Take 2 capsules by mouth daily., Disp: , Rfl:    Coenzyme Q10 (COQ10) 100 MG CAPS, Take 100 mEq by mouth daily. , Disp: , Rfl:    diclofenac Sodium (VOLTAREN) 1 % GEL, Apply topically as needed (pain)., Disp: , Rfl:    diltiazem (CARDIZEM CD) 360 MG 24 hr capsule, TAKE 1 CAPSULE BY MOUTH  DAILY, Disp: 90 capsule, Rfl: 3   diltiazem (CARDIZEM) 30 MG tablet, Take 1 tablet (30 mg total) by mouth every 4 (four) hours as needed (elevated HR as long as BP > 100)., Disp: 30 tablet, Rfl: 6   ferrous sulfate  325 (65 FE) MG EC tablet, Take 650 mg by mouth daily. , Disp: , Rfl:    glucose blood (ONETOUCH ULTRA) test strip, 1 Container by Other route every morning., Disp: , Rfl:    glucose blood test strip, Use to test blood sugar 3 times daily., Disp: 300 each, Rfl: 3   Insulin Pen Needle 31G X 8 MM MISC, Use with pen to inject once daily., Disp: 100 each, Rfl: 3   Lancets (ONETOUCH ULTRASOFT) lancets, Use to check blood sugar daily., Disp: 100 each, Rfl: 3   leflunomide (ARAVA) 10 MG tablet, Take 10 mg by mouth daily., Disp: , Rfl:    levothyroxine (SYNTHROID) 112 MCG tablet, Take 112 mcg by mouth daily before breakfast., Disp: , Rfl:    metFORMIN (GLUCOPHAGE-XR) 500 MG 24 hr tablet, TAKE 4 TABLETS BY MOUTH  DAILY, Disp: 360 tablet, Rfl: 3   metoprolol succinate (TOPROL-XL) 100 MG 24 hr tablet, TAKE 1 TABLET BY MOUTH 2  TIMES DAILY. TAKE WITH OR  IMMEDIATELY FOLLOWING A   MEAL., Disp: 180 tablet, Rfl: 1   Multiple Vitamin (MULTI-VITAMINS) TABS, Take daily by mouth., Disp: , Rfl:    nitroGLYCERIN (NITROSTAT) 0.4 MG SL tablet, Place 1 tablet (0.4 mg total) under the tongue every 5 (five) minutes as needed for chest pain (Call 911 if chest pain after three doses)., Disp: 25 tablet, Rfl: 3   omeprazole (PRILOSEC) 40 MG capsule, TAKE 1 CAPSULE BY MOUTH  DAILY, Disp: 90 capsule, Rfl: 3   pregabalin (LYRICA) 50 MG capsule, Take 1 capsule (50 mg total) by mouth 2 (two) times daily., Disp: 60 capsule, Rfl: 2   rosuvastatin (CRESTOR) 10 MG tablet, TAKE 1 TABLET BY MOUTH EVERY DAY, Disp: 90 tablet, Rfl: 3   Semaglutide, 1 MG/DOSE, 2 MG/1.5ML SOPN, Inject 1 mg into the skin once a week., Disp: 9 mL, Rfl: 3   Semaglutide,0.25 or 0.5MG/DOS, (OZEMPIC, 0.25 OR 0.5 MG/DOSE,) 2 MG/1.5ML SOPN, Inject 0.5 mg into the skin once a week., Disp: 1.5 mL, Rfl: 0   triamcinolone cream (KENALOG) 0.1 %, Apply 1 application topically as needed (dermatitis)., Disp: , Rfl:   Social History   Tobacco Use  Smoking Status Former   Packs/day: 2.00   Years: 30.00   Pack years: 60.00   Types: Cigarettes   Quit date: 02/26/1993   Years since quitting: 27.5  Smokeless Tobacco Never    Allergies  Allergen Reactions   Actos [Pioglitazone] Swelling    Swelling all over body and moonface   Lisinopril Swelling    Swelling, may have had some breathing involvement. Angioedema   Nsaids Other (See Comments)    Patient reports internal bleeding. Experienced with tolmetin.    Penicillins Shortness Of Breath and Swelling    Arm Swelling with Penicillin (Occurred in 1960s) Breathing - throat swelling with Amoxicillin (Occurred prior to 2002)   Sulfamethoxazole Hives and Itching    "welps all over" immediately after dose   Flecainide Other (See Comments)    Blurry  vision   Hctz [Hydrochlorothiazide] Other (See Comments)    Gout   Iodinated Diagnostic Agents Itching    Pt. Developed mild itching  after receiving IV cm; pt. Held; Dr. Keane Scrape recomended she take 50 mg of benadryl when she goes home-if necessary; Dr Mickey Farber recommends benadryl prior to future exams requiring contrast media, but stated other doctors may recommend another premedication prep.   Jardiance [Empagliflozin] Other (See Comments)    Patient reported "it made her kidneys not  work"  Patient resists Equities trader.   Statins Other (See Comments)    Muscle aches with multiple statins. Able to tolerate rosuvastatin.   Gabapentin Other (See Comments)    Caused dysphoria    Losartan Potassium Other (See Comments)    Lower extremity swelling   Zetia [Ezetimibe] Other (See Comments)    Cramps   Objective:  There were no vitals filed for this visit. There is no height or weight on file to calculate BMI. Constitutional Well developed. Well nourished.  Vascular Dorsalis pedis pulses palpable bilaterally. Posterior tibial pulses palpable bilaterally. Capillary refill normal to all digits.  No cyanosis or clubbing noted. Pedal hair growth normal.  Neurologic Normal speech. Oriented to person, place, and time. Epicritic sensation to light touch grossly present bilaterally.  Dermatologic Nails well groomed and normal in appearance. No open wounds. No skin lesions.  Orthopedic: Normal joint ROM without pain or crepitus bilaterally. No visible deformities. Tender to palpation at the calcaneal tuber bilaterally. No pain with calcaneal squeeze bilaterally. Ankle ROM diminished range of motion bilaterally. Silfverskiold Test: positive bilaterally.  Decreased in motor strength of anterior lateral and posterior compartment.  4 out of 5 with manual muscle testing of each of the above listed compartments.   Radiographs: Taken and reviewed. No acute fractures or dislocations. No evidence of stress fracture.  Plantar heel spur present. Posterior heel spur absent. 3 views of skeletally mature adult bilateral foot:  Plantar and posterior heel spurring noted.  Generalized osteopenia noted.  Bunion deformity noted.  Osteoarthritic changes noted to the midfoot.  No other bony abnormalities identified.  No fractures noted.  Assessment:   1. Heel spur, unspecified laterality   2. Plantar fasciitis of right foot   3. Plantar fasciitis of left foot    Plan:  Patient was evaluated and treated and all questions answered.  Plantar Fasciitis, bilaterally left greater than right side - XR reviewed as above.  - Educated on icing and stretching. Instructions given.  -No further injection delivered to the plantar fascia as it did not help. - DME: Cam boot immobilization - Pharmacologic management: None  Antalgic gait/generalized weakness both lower extremity -The patient the etiology of antalgic gait with decreased strength and various treatment options were discussed.  I believe patient will benefit from physical therapy.  Physical therapy prescription was dispensedFor general deconditioning of both lower extremity. -I discussed the importance of physical therapy.  And encouraged her to go to physical therapy   No follow-ups on file.

## 2020-09-12 NOTE — Progress Notes (Signed)
[facility] Summary Report / Loop Recorder 

## 2020-09-14 ENCOUNTER — Ambulatory Visit: Payer: Medicare Other | Admitting: Podiatry

## 2020-09-26 ENCOUNTER — Ambulatory Visit (INDEPENDENT_AMBULATORY_CARE_PROVIDER_SITE_OTHER): Payer: Medicare Other

## 2020-09-26 DIAGNOSIS — I480 Paroxysmal atrial fibrillation: Secondary | ICD-10-CM | POA: Diagnosis not present

## 2020-09-27 LAB — CUP PACEART REMOTE DEVICE CHECK
Date Time Interrogation Session: 20220731095108
Implantable Pulse Generator Implant Date: 20220525

## 2020-09-29 ENCOUNTER — Telehealth: Payer: Self-pay | Admitting: Pharmacist

## 2020-09-29 ENCOUNTER — Other Ambulatory Visit: Payer: Self-pay | Admitting: Family Medicine

## 2020-09-29 MED ORDER — GLIPIZIDE ER 10 MG PO TB24: 10.0000 mg | ORAL_TABLET | Freq: Two times a day (BID) | ORAL | 0 refills | Status: DC

## 2020-09-29 NOTE — Telephone Encounter (Signed)
Noted and agree. 

## 2020-09-29 NOTE — Telephone Encounter (Signed)
Patient called the office and requested call back RE elevated blood sugars post steroid injection in her foot.   She states she had a work-up including an MRI by Dr. Dara Lords.  The MRI results were not positive and Dr. Layne Benton injected her foot with a steroid resulting in blood sugars of 358 yesterday evening and > 400 this AM.   She has previous had a steroid injection of her foot about 2 months ago which resulted in elevated blood sugars for the following 3 weeks.    She is concerned with how high her readings are and requests an adjustment to her blood sugar regimen.   Her current regimen include metforminXR taken: 4X'500mg'$  ('200mg'$ ) daily And Ozempic (semaglutide) '1mg'$  once weekly.   She previously has taken glipizide but this was stopped several months ago due to improved control with Ozempic and metformin.   We discussed initiation of insulin AND also restart of glipizide.  She preferred glipizide.   New order for Glipizide '10mg'$  XL once daily with instruction to increase to 2 daily IF blood sugar remains elevated.   Patient was instructed to call our office early next week (4-5 day from today) if blood sugars remain elevated despite

## 2020-09-30 ENCOUNTER — Ambulatory Visit: Payer: Medicare Other | Admitting: Podiatry

## 2020-09-30 ENCOUNTER — Telehealth: Payer: Self-pay

## 2020-09-30 NOTE — Telephone Encounter (Signed)
Transmission received 1000 today shows pt is still in AF.  Current episode starting this morning just before 9am.    Spoke with pt, she reports feeling her heart racing and overall feeling weak.  She just took her morning doses of Metoprolol and Diltiazem '360mg'$  about 15 minutes ago.  Pt has not yet taken her PRN dose of Diltiazem '30mg'$ .  Advise she can take that every 4 hours as needed. She indicated she will take that now.    Forwarding to AF clinic for follow-up with pt.

## 2020-09-30 NOTE — Telephone Encounter (Signed)
The patient thinks she is in A-Fib. I let her speak with Amy, rn.

## 2020-09-30 NOTE — Telephone Encounter (Signed)
Called and spoke with patient, she is agreeable to appt 10/03/20 at 9 am with Adline Peals, PA-C. Pt states she is feeling some better since taking the PRN dose of Diltiazem 30 mg approx an hour ago.

## 2020-10-03 ENCOUNTER — Other Ambulatory Visit: Payer: Self-pay

## 2020-10-03 ENCOUNTER — Encounter (HOSPITAL_COMMUNITY): Payer: Self-pay | Admitting: Physician Assistant

## 2020-10-03 ENCOUNTER — Ambulatory Visit (HOSPITAL_COMMUNITY)
Admission: RE | Admit: 2020-10-03 | Discharge: 2020-10-03 | Disposition: A | Discharge status: Home / Self Care | Payer: Medicare Other | Source: Ambulatory Visit | Attending: Physician Assistant | Admitting: Physician Assistant

## 2020-10-03 VITALS — BP 150/90 | HR 68 | Ht 65.0 in | Wt 190.6 lb

## 2020-10-03 DIAGNOSIS — N189 Chronic kidney disease, unspecified: Secondary | ICD-10-CM | POA: Diagnosis not present

## 2020-10-03 DIAGNOSIS — I5032 Chronic diastolic (congestive) heart failure: Secondary | ICD-10-CM | POA: Diagnosis not present

## 2020-10-03 DIAGNOSIS — G4733 Obstructive sleep apnea (adult) (pediatric): Secondary | ICD-10-CM | POA: Diagnosis not present

## 2020-10-03 DIAGNOSIS — Z87891 Personal history of nicotine dependence: Secondary | ICD-10-CM | POA: Diagnosis not present

## 2020-10-03 DIAGNOSIS — I4892 Unspecified atrial flutter: Secondary | ICD-10-CM | POA: Insufficient documentation

## 2020-10-03 DIAGNOSIS — Z794 Long term (current) use of insulin: Secondary | ICD-10-CM | POA: Insufficient documentation

## 2020-10-03 DIAGNOSIS — E1122 Type 2 diabetes mellitus with diabetic chronic kidney disease: Secondary | ICD-10-CM | POA: Insufficient documentation

## 2020-10-03 DIAGNOSIS — Z09 Encounter for follow-up examination after completed treatment for conditions other than malignant neoplasm: Secondary | ICD-10-CM | POA: Insufficient documentation

## 2020-10-03 DIAGNOSIS — D6869 Other thrombophilia: Secondary | ICD-10-CM

## 2020-10-03 DIAGNOSIS — Z79899 Other long term (current) drug therapy: Secondary | ICD-10-CM | POA: Insufficient documentation

## 2020-10-03 DIAGNOSIS — Z7901 Long term (current) use of anticoagulants: Secondary | ICD-10-CM | POA: Diagnosis not present

## 2020-10-03 DIAGNOSIS — I480 Paroxysmal atrial fibrillation: Secondary | ICD-10-CM | POA: Insufficient documentation

## 2020-10-03 DIAGNOSIS — Z7984 Long term (current) use of oral hypoglycemic drugs: Secondary | ICD-10-CM | POA: Diagnosis not present

## 2020-10-03 DIAGNOSIS — I130 Hypertensive heart and chronic kidney disease with heart failure and stage 1 through stage 4 chronic kidney disease, or unspecified chronic kidney disease: Secondary | ICD-10-CM | POA: Diagnosis not present

## 2020-10-03 DIAGNOSIS — Z8249 Family history of ischemic heart disease and other diseases of the circulatory system: Secondary | ICD-10-CM | POA: Diagnosis not present

## 2020-10-03 DIAGNOSIS — E785 Hyperlipidemia, unspecified: Secondary | ICD-10-CM | POA: Diagnosis not present

## 2020-10-03 DIAGNOSIS — Z560 Unemployment, unspecified: Secondary | ICD-10-CM | POA: Diagnosis not present

## 2020-10-03 DIAGNOSIS — E669 Obesity, unspecified: Secondary | ICD-10-CM | POA: Insufficient documentation

## 2020-10-03 NOTE — Progress Notes (Signed)
Patient ID: Courtney Pena, female   DOB: Aug 14, 1940, 80 y.o.   MRN: [id number]     Primary Care Physician: [name], MD Referring Physician: Dr. Johnsie Cancel Primary EP: Dr Sanda Klein is a 80 y.o. 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 Johnsie Cancel and being seen 7/7 in afib clinic. Other than a few intermittent flutters, she has not noticed any sustained afib. Tolerating flecainide well.Tolerating  Xarelto, no bleeding issue, but is in do-nut hole and will help fill out assistance forms today to see if she will qualify for assistance. She did have a f/u ETT, for which she did not stay on the treadmill for a minute but no EKG changes were noted with exertion.  Returns to afib clinic 8/17, for c/o blurred vision. She states that she has noted vision change since starting flecainide but thought it would wear off. She stopped flecainide yesterday and vision is already better. She is in SR today and since starting flecainide, afib burden has been low. She also stopped losartan because she retained fluid with previous use.   Her PCP wanted her to try one more time to see if the S.E. occurred again and it did. Will f/u with PCP for BP.  F/u in afib clinc 01/1116. She is off flecainide. She saw Dr. Rayann Heman in August and was given options for ablation or Rythmol. She decide that she would not make any changes. She feels that she has not noted the irregularity related with afib but has noted a different irregularity. She had pac's on her EKG the other day and what she describes fits this pattern.   She asked to be seen in afib clinic today with a few concerns. She describes seconds to minutes of a sensation that will start in her stomach  and spread to her chest, feels a flutter and a pin prick. This may happen 2x a day.  Usually at rest. She wanted to report these symptoms but does not feel they are bad enough to change her approach. She had a stress test in 2017 which was low risk.  F/u in afib clinic 06/1416 for increase in afib burden. She will note 2x a week and she will return to SR within one hour with taking 30 mg cardizem.. She had been on flecainide in the past but did not tolerate. She would like to discuss  with Dr. Rayann Heman pursuing ablation. She did see him last August but was not ready to commit at that time.   F/u afib clinic 10/15/16. She has the same afib pattern, around 1-2x a week with 30 mg cardizem getting her back to SR within around 30 mins. She did have an appointment with Dr. Rayann Heman re ablation, but since burden was low, it was more wait and see. She is still happy to take this approach.  F/u 02/27/17, she is in the clinic for f/u of event monitor placed by Dr. Johnsie Cancel after increasing metoprolol to 100 mg bid. Monitor did not show any afib or significant PC burden. She does not report having to take any extra Cardizem in a while. continues on xarelto without issues.  F/u in afib clinic, 12/30/19.I have not seen pt for a number of years, afib has been quiet,  but she recently has an ER visit for afib with RVR. She was started  on Cardizem drip and converted to SR. Per pt she had run out of metoprolol for a week prior to this occurring,waitijng for the drug to come by mail. She is now back on BB and is SR, no further afib.  She continues on eliquis for a CHA2DS2VASc of at least 6.   Follow up in the AF clinic 10/03/20. Patient reports that she felt she was in afib 09/30/20 with symptoms of heart racing and generalized weakness. ILR showed a 3 hour episodes that day. She did have a steroid injection just two days prior.  Today, she denies symptoms of chest pain, shortness of breath, orthopnea, PND, lower extremity edema, dizziness, presyncope, syncope, or neurologic sequela. The patient is tolerating  medications without difficulties and is otherwise without complaint today.   Past Medical History:  Diagnosis Date   Allergy    Anemia due to GI blood loss 08/12/2011   Arthritis    Atrial flutter ([place]) 12-07-2010   converted in ED with 300 mg flecainide   CAD (coronary artery disease)    a. mild per cath in 2004;  b. nonischemic Myoview in March 2012;  c. Lex MV 1/14:  EF 66%, no ischemia   Cataract    Chronic anticoagulation - Xarelto started 07/07/2015, CHADS2CVASC=5 07/07/2015   Clotting disorder (HCC)    Diastolic CHF ([place]) 10/06/9145   External hemorrhoids 06/07/2010   Gastric antral vascular ectasia    source for gi bleed in 07/2011 - Xarelto stopped   Gastritis    GERD (gastroesophageal reflux disease)    Hiatal hernia    Hyperlipidemia    Hypertension    Hypothyroidism    Neuromuscular disorder ([place])    Obesity    Paroxysmal atrial fibrillation ([place])    Personal history of colonic polyps 06/06/2009   cecal polyp   Sleep apnea    does not use CPAP   Type 2 diabetes mellitus with diabetic chronic kidney disease ([place]) 04/25/2006       Ulcer    Past Surgical History:  Procedure Laterality Date   BREAST BIOPSY Left    BREAST EXCISIONAL BIOPSY Left    benign   CARDIAC CATHETERIZATION  1999&2004   CARPAL TUNNEL RELEASE Bilateral 2003   CATARACT EXTRACTION Bilateral    COLONOSCOPY     ESOPHAGOGASTRODUODENOSCOPY  08/13/2011   Procedure: ESOPHAGOGASTRODUODENOSCOPY (EGD);  Surgeon: Gatha Mayer, MD;  Location: [facility] ENDOSCOPY;  Service: Endoscopy;  Laterality: N/A;   implantable loop recorder placement  07/20/2020   Medtronic Reveal Linq model LNQ 22 ([place] [id number]) implantable loop recorder by Dr Rayann Heman   POLYPECTOMY      Current Outpatient Medications  Medication Sig Dispense Refill   acetaminophen (TYLENOL) 650 MG CR tablet Take 650 mg by mouth every 8 (eight) hours as needed for pain.     apixaban (ELIQUIS) 5 MG TABS tablet Take 1 tablet (5 mg total) by mouth 2 (two)  times daily. 42 tablet 0   Apoaequorin (PREVAGEN PO) Take 1 tablet by mouth.     blood glucose meter kit and supplies Dispense based on patient and insurance preference. Use up to four times daily as directed. (FOR ICD-10 E10.9, E11.9). 1 each 0   cetirizine (ZYRTEC) 10 MG tablet Take 10 mg by mouth as needed for allergies. Takes 10 mg as needed for sinus trouble     Cholecalciferol (VITAMIN D3) 2000 units TABS Take 2 capsules by mouth daily.     Coenzyme Q10 (COQ10) 100 MG CAPS Take 100  mEq by mouth daily.      diclofenac Sodium (VOLTAREN) 1 % GEL Apply topically as needed (pain).     diltiazem (CARDIZEM CD) 360 MG 24 hr capsule TAKE 1 CAPSULE BY MOUTH  DAILY 90 capsule 3   diltiazem (CARDIZEM) 30 MG tablet Take 1 tablet (30 mg total) by mouth every 4 (four) hours as needed (elevated HR as long as BP > 100). 30 tablet 6   ferrous sulfate 325 (65 FE) MG EC tablet Take 650 mg by mouth daily.      glipiZIDE (GLUCOTROL XL) 10 MG 24 hr tablet Take 1 tablet (10 mg total) by mouth 2 (two) times daily. Take once daily, if blood sugars remain elevated > 200, increase to twice daily. 60 tablet 0   glucose blood (ONETOUCH ULTRA) test strip 1 Container by Other route every morning.     glucose blood test strip Use to test blood sugar 3 times daily. 300 each 3   Insulin Pen Needle 31G X 8 MM MISC Use with pen to inject once daily. 100 each 3   Lancets (ONETOUCH ULTRASOFT) lancets Use to check blood sugar daily. 100 each 3   leflunomide (ARAVA) 10 MG tablet Take 10 mg by mouth daily.     levothyroxine (SYNTHROID) 112 MCG tablet Take 112 mcg by mouth daily before breakfast.     meclizine (ANTIVERT) 25 MG tablet TAKE 1 TABLET (25 MG TOTAL) BY MOUTH AS NEEDED. 30 tablet 3   metFORMIN (GLUCOPHAGE-XR) 500 MG 24 hr tablet TAKE 4 TABLETS BY MOUTH  DAILY 360 tablet 3   metoprolol succinate (TOPROL-XL) 100 MG 24 hr tablet TAKE 1 TABLET BY MOUTH 2  TIMES DAILY. TAKE WITH OR  IMMEDIATELY FOLLOWING A  MEAL. 180 tablet 1    Multiple Vitamin (MULTI-VITAMINS) TABS Take daily by mouth.     nitroGLYCERIN (NITROSTAT) 0.4 MG SL tablet Place 1 tablet (0.4 mg total) under the tongue every 5 (five) minutes as needed for chest pain (Call 911 if chest pain after three doses). 25 tablet 3   omeprazole (PRILOSEC) 40 MG capsule TAKE 1 CAPSULE BY MOUTH  DAILY 90 capsule 3   rosuvastatin (CRESTOR) 10 MG tablet TAKE 1 TABLET BY MOUTH EVERY DAY 90 tablet 3   Semaglutide, 1 MG/DOSE, 2 MG/1.5ML SOPN Inject 1 mg into the skin once a week. 9 mL 3   triamcinolone cream (KENALOG) 0.1 % Apply 1 application topically as needed (dermatitis).     No current facility-administered medications for this encounter.    Allergies  Allergen Reactions   Actos [Pioglitazone] Swelling    Swelling all over body and moonface   Lisinopril Swelling    Swelling, may have had some breathing involvement. Angioedema   Nsaids Other (See Comments)    Patient reports internal bleeding. Experienced with tolmetin.    Penicillins Shortness Of Breath and Swelling    Arm Swelling with Penicillin (Occurred in 1960s) Breathing - throat swelling with Amoxicillin (Occurred prior to 2002)   Sulfamethoxazole Hives and Itching    "welps all over" immediately after dose   Flecainide Other (See Comments)    Blurry  vision   Hctz [Hydrochlorothiazide] Other (See Comments)    Gout   Iodinated Diagnostic Agents Itching    Pt. Developed mild itching after receiving IV cm; pt. Held; Dr. Keane Scrape recomended she take 50 mg of benadryl when she goes home-if necessary; Dr Mickey Farber recommends benadryl prior to future exams requiring contrast media, but stated other doctors may  recommend another premedication prep.   Jardiance [Empagliflozin] Other (See Comments)    Patient reported "it made her kidneys not work"  Patient resists retrial/alternative.   Statins Other (See Comments)    Muscle aches with multiple statins. Able to tolerate rosuvastatin.   Gabapentin Other (See  Comments)    Caused dysphoria    Losartan Potassium Other (See Comments)    Lower extremity swelling   Zetia [Ezetimibe] Other (See Comments)    Cramps    Social History   Socioeconomic History   Marital status: Widowed    Spouse name: Iona Beard   Number of children: 4   Years of education: some colle   Highest education level: Not on file  Occupational History   Occupation: Games developer: UNEMPLOYED  Tobacco Use   Smoking status: Former    Packs/day: 2.00    Years: 30.00    Pack years: 60.00    Types: Cigarettes    Quit date: 02/26/1993    Years since quitting: 27.6   Smokeless tobacco: Never  Vaping Use   Vaping Use: Never used  Substance and Sexual Activity   Alcohol use: No    Alcohol/week: 0.0 standard drinks   Drug use: No   Sexual activity: Not Currently  Other Topics Concern   Not on file  Social History Narrative   Not on file   Social Determinants of Health   Financial Resource Strain: Not on file  Food Insecurity: Not on file  Transportation Needs: Not on file  Physical Activity: Not on file  Stress: Not on file  Social Connections: Not on file  Intimate Partner Violence: Not on file    Family History  Problem Relation Age of Onset   Heart disease Father    Diabetes Maternal Grandfather    Hypertension Mother    Other Mother        brain tumor-benign   Diabetes Daughter        pre-diabeties   Colon polyps Daughter    Thyroid disease Daughter        x 2   Colon cancer Neg Hx    Esophageal cancer Neg Hx    Stomach cancer Neg Hx    Rectal cancer Neg Hx     ROS- All systems are reviewed and negative except as per the HPI above  Physical Exam: There were no vitals filed for this visit.  GEN- The patient is a well appearing elderly obese female, alert and oriented x 3 today.   HEENT-head normocephalic, atraumatic, sclera clear, conjunctiva pink, hearing intact, trachea midline. Lungs- Clear to ausculation bilaterally, normal work  of breathing Heart- Regular rate and rhythm, no murmurs, rubs or gallops  GI- soft, NT, ND, + BS Extremities- no clubbing, cyanosis, or edema MS- no significant deformity or atrophy Skin- no rash or lesion Psych- euthymic mood, full affect Neuro- strength and sensation are intact   EKG- SR, 1st degree AV block Vent. rate 68 BPM PR interval 216 ms QRS duration 90 ms QT/QTcB 392/416 ms   Assessment and Plan: 1. Paroxysmal AFIb/atrial flutter ILR shows a 3 hour episode on 8/5, suspect related to recent steroid injection. We discussed therapeutic options today including watchful waiting, dofetilide, and ablation. Patient would like to continue present therapy for now.  Continue diltiazem 360 mg daily with 30 mg PRN q 4 hours as needed for heart racing. Continue Toprol 100 mg BID Continue eliquis 5 mg bid for chadsvasc score of at least 6  2. HTN Stable, no changes today.   3. OSA The importance of adequate treatment of sleep apnea was discussed today in order to improve our ability to maintain sinus rhythm long term. Encouraged compliance with CPAP therapy.   Follow up with Dr Rayann Heman as scheduled.    [facility] [address] [place], [place] [address] [phone]

## 2020-10-19 DIAGNOSIS — M659 Synovitis and tenosynovitis, unspecified: Secondary | ICD-10-CM | POA: Insufficient documentation

## 2020-10-19 NOTE — Progress Notes (Signed)
[facility] Summary Report / Loop Recorder 

## 2020-10-21 ENCOUNTER — Ambulatory Visit (INDEPENDENT_AMBULATORY_CARE_PROVIDER_SITE_OTHER): Payer: Medicare Other | Admitting: Internal Medicine

## 2020-10-21 ENCOUNTER — Other Ambulatory Visit: Payer: Self-pay

## 2020-10-21 VITALS — BP 134/72 | HR 77 | Ht 66.0 in | Wt 191.4 lb

## 2020-10-21 DIAGNOSIS — G4733 Obstructive sleep apnea (adult) (pediatric): Secondary | ICD-10-CM | POA: Diagnosis not present

## 2020-10-21 DIAGNOSIS — I10 Essential (primary) hypertension: Secondary | ICD-10-CM | POA: Diagnosis not present

## 2020-10-21 DIAGNOSIS — I4892 Unspecified atrial flutter: Secondary | ICD-10-CM | POA: Diagnosis not present

## 2020-10-21 DIAGNOSIS — I480 Paroxysmal atrial fibrillation: Secondary | ICD-10-CM

## 2020-10-21 NOTE — Patient Instructions (Addendum)
Medication Instructions:  Your physician recommends that you continue on your current medications as directed. Please refer to the Current Medication list given to you today.  Labwork: None ordered.  Testing/Procedures: None ordered.  Follow-Up: Your physician wants you to follow-up in: 6 months with the [facility]. They will contact you to schedule.    Any Other Special Instructions Will Be Listed Below (If Applicable).  If you need a refill on your cardiac medications before your next appointment, please call your pharmacy.

## 2020-10-21 NOTE — Progress Notes (Signed)
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 female who presents today for routine electrophysiology followup.  Since last being seen in our clinic, the patient reports doing very well.  Today, she denies symptoms of palpitations, chest pain, shortness of breath,  lower extremity edema, dizziness, presyncope, or syncope.  The patient is otherwise without complaint today.   Past Medical History:  Diagnosis Date   Allergy    Anemia due to GI blood loss 08/12/2011   Arthritis    Atrial flutter ([place]) 12-07-2010   converted in ED with 300 mg flecainide   CAD (coronary artery disease)    a. mild per cath in 2004;  b. nonischemic Myoview in March 2012;  c. Lex MV 1/14:  EF 66%, no ischemia   Cataract    Chronic anticoagulation - Xarelto started 07/07/2015, CHADS2CVASC=5 07/07/2015   Clotting disorder (HCC)    Diastolic CHF ([place]) 2/63/7858   External hemorrhoids 06/07/2010   Gastric antral vascular ectasia    source for gi bleed in 07/2011 - Xarelto stopped   Gastritis    GERD (gastroesophageal reflux disease)    Hiatal hernia    Hyperlipidemia    Hypertension    Hypothyroidism    Neuromuscular disorder ([place])    Obesity    Paroxysmal atrial fibrillation ([place])    Personal history of colonic polyps 06/06/2009   cecal polyp   Sleep apnea    does not use CPAP   Type 2 diabetes mellitus with diabetic chronic kidney disease ([place]) 04/25/2006       Ulcer    Past Surgical History:  Procedure Laterality Date   BREAST BIOPSY Left    BREAST EXCISIONAL BIOPSY Left    benign   CARDIAC CATHETERIZATION  1999&2004   CARPAL TUNNEL RELEASE Bilateral 2003   CATARACT EXTRACTION Bilateral    COLONOSCOPY     ESOPHAGOGASTRODUODENOSCOPY  08/13/2011   Procedure: ESOPHAGOGASTRODUODENOSCOPY (EGD);  Surgeon: Gatha Mayer, MD;  Location: [facility] ENDOSCOPY;  Service: Endoscopy;  Laterality: N/A;   implantable loop recorder placement  07/20/2020   Medtronic Reveal  Linq model LNQ 22 ([place] [id number]) implantable loop recorder by Dr Rayann Heman   POLYPECTOMY      ROS- all systems are reviewed and negatives except as per HPI above  Current Outpatient Medications  Medication Sig Dispense Refill   acetaminophen (TYLENOL) 650 MG CR tablet Take 650 mg by mouth every 8 (eight) hours as needed for pain.     apixaban (ELIQUIS) 5 MG TABS tablet Take 1 tablet (5 mg total) by mouth 2 (two) times daily. 42 tablet 0   Apoaequorin (PREVAGEN PO) Take 1 tablet by mouth.     blood glucose meter kit and supplies Dispense based on patient and insurance preference. Use up to four times daily as directed. (FOR ICD-10 E10.9, E11.9). 1 each 0   cetirizine (ZYRTEC) 10 MG tablet Take 10 mg by mouth as needed for allergies. Takes 10 mg as needed for sinus trouble     Cholecalciferol (VITAMIN D3) 2000 units TABS Take 2 capsules by mouth daily.     Coenzyme Q10 (COQ10) 100 MG CAPS Take 100 mEq by mouth daily.      diclofenac Sodium (VOLTAREN) 1 % GEL Apply topically as needed (pain).     diltiazem (CARDIZEM CD) 360 MG 24 hr capsule TAKE 1 CAPSULE BY MOUTH  DAILY 90 capsule 3   diltiazem (CARDIZEM) 30 MG tablet Take 1  tablet (30 mg total) by mouth every 4 (four) hours as needed (elevated HR as long as BP > 100). 30 tablet 6   ferrous sulfate 325 (65 FE) MG EC tablet Take 650 mg by mouth daily.      glipiZIDE (GLUCOTROL XL) 10 MG 24 hr tablet Take 1 tablet (10 mg total) by mouth 2 (two) times daily. Take once daily, if blood sugars remain elevated > 200, increase to twice daily. 60 tablet 0   glucose blood (ONETOUCH ULTRA) test strip 1 Container by Other route every morning.     glucose blood test strip Use to test blood sugar 3 times daily. 300 each 3   Insulin Pen Needle 31G X 8 MM MISC Use with pen to inject once daily. 100 each 3   Lancets (ONETOUCH ULTRASOFT) lancets Use to check blood sugar daily. 100 each 3   leflunomide (ARAVA) 10 MG tablet Take 10 mg by mouth daily.      levothyroxine (SYNTHROID) 112 MCG tablet Take 112 mcg by mouth daily before breakfast.     meclizine (ANTIVERT) 25 MG tablet TAKE 1 TABLET (25 MG TOTAL) BY MOUTH AS NEEDED. 30 tablet 3   metFORMIN (GLUCOPHAGE-XR) 500 MG 24 hr tablet TAKE 4 TABLETS BY MOUTH  DAILY 360 tablet 3   metoprolol succinate (TOPROL-XL) 100 MG 24 hr tablet TAKE 1 TABLET BY MOUTH 2  TIMES DAILY. TAKE WITH OR  IMMEDIATELY FOLLOWING A  MEAL. 180 tablet 1   Multiple Vitamin (MULTI-VITAMINS) TABS Take daily by mouth.     nitroGLYCERIN (NITROSTAT) 0.4 MG SL tablet Place 1 tablet (0.4 mg total) under the tongue every 5 (five) minutes as needed for chest pain (Call 911 if chest pain after three doses). 25 tablet 3   omeprazole (PRILOSEC) 40 MG capsule TAKE 1 CAPSULE BY MOUTH  DAILY 90 capsule 3   rosuvastatin (CRESTOR) 10 MG tablet TAKE 1 TABLET BY MOUTH EVERY DAY 90 tablet 3   Semaglutide, 1 MG/DOSE, 2 MG/1.5ML SOPN Inject 1 mg into the skin once a week. 9 mL 3   triamcinolone cream (KENALOG) 0.1 % Apply 1 application topically as needed (dermatitis).     colchicine 0.6 MG tablet Take 1 tablet by mouth as needed.     No current facility-administered medications for this visit.    Physical Exam: Vitals:   10/21/20 1401  BP: 134/72  Pulse: 77  SpO2: 96%  Weight: 191 lb 6.4 oz (86.8 kg)  Height: '5\' 6"'  (1.676 m)    GEN- The patient is well appearing, alert and oriented x 3 today.   Head- normocephalic, atraumatic Eyes-  Sclera clear, conjunctiva pink Ears- hearing intact Oropharynx- clear Lungs- Clear to ausculation bilaterally, normal work of breathing Heart- Regular rate and rhythm, no murmurs, rubs or gallops, PMI not laterally displaced GI- soft, NT, ND, + BS Extremities- no clubbing, cyanosis, or edema  Wt Readings from Last 3 Encounters:  10/21/20 191 lb 6.4 oz (86.8 kg)  10/03/20 190 lb 9.6 oz (86.5 kg)  08/17/20 193 lb (87.5 kg)    EKG tracing ordered today is personally reviewed and shows sinus rhythm 77  bpm, PR 234 msec  Assessment and Plan:  Paroxysmal atrial fibrillation/ atrial flutter Well  controlled By ILR, burden is 0.6%.  longest event was 3 hours and 12 minutes.  This occurred around a time of steroid injection Chads2vasc score is 6.  Continue eliquis She did not tolerate flecainide We could consider multaq vs tikosyn vs ablation  if her AF burden worsens substantially  2. HTN Stable No change required today  3. OSA Compliance with CPAP therapy is advised  4. Overweight Body mass index is 30.89 kg/m. Lifestyle modification advised  Return to AF clinic in 6 months  Thompson Grayer MD, [facility] 10/21/2020 2:34 PM

## 2020-10-28 ENCOUNTER — Other Ambulatory Visit: Payer: Self-pay | Admitting: Internal Medicine

## 2020-11-01 ENCOUNTER — Ambulatory Visit (INDEPENDENT_AMBULATORY_CARE_PROVIDER_SITE_OTHER): Payer: Medicare Other

## 2020-11-01 DIAGNOSIS — I480 Paroxysmal atrial fibrillation: Secondary | ICD-10-CM

## 2020-11-02 LAB — CUP PACEART REMOTE DEVICE CHECK
Date Time Interrogation Session: 20220902094710
Implantable Pulse Generator Implant Date: 20220525

## 2020-11-03 ENCOUNTER — Telehealth: Payer: Self-pay

## 2020-11-03 NOTE — Telephone Encounter (Signed)
Called patient.  C/O one sided burning pain at waist.  No rash.  No change in bowels.  No urgency, frequency, dysuria or fever.  Discussed symptomatic care.  Could be shingles pain before rash.  If she does break out in a rash, call back so we can start on valtrex.

## 2020-11-03 NOTE — Telephone Encounter (Signed)
Pt calling to make an appt with Dr. Andria Frames. Pt is reporting right side pain upon standing. Denies fever, Nausea or vomiting. First available appt isn't until 9/22. Pt asking if she can talk to Dr. Andria Frames as the 22nd is a long way out. Pt can be reaches at [phone].

## 2020-11-04 ENCOUNTER — Ambulatory Visit: Payer: Medicare Other | Admitting: Physical Therapy

## 2020-11-06 ENCOUNTER — Other Ambulatory Visit: Payer: Self-pay

## 2020-11-06 ENCOUNTER — Emergency Department
Admission: EM | Admit: 2020-11-06 | Discharge: 2020-11-06 | Disposition: A | Discharge status: Home / Self Care | Payer: Medicare Other | Attending: Emergency Medicine | Admitting: Emergency Medicine

## 2020-11-06 ENCOUNTER — Emergency Department: Payer: Medicare Other

## 2020-11-06 DIAGNOSIS — I5032 Chronic diastolic (congestive) heart failure: Secondary | ICD-10-CM | POA: Diagnosis not present

## 2020-11-06 DIAGNOSIS — I130 Hypertensive heart and chronic kidney disease with heart failure and stage 1 through stage 4 chronic kidney disease, or unspecified chronic kidney disease: Secondary | ICD-10-CM | POA: Diagnosis not present

## 2020-11-06 DIAGNOSIS — I4891 Unspecified atrial fibrillation: Secondary | ICD-10-CM | POA: Insufficient documentation

## 2020-11-06 DIAGNOSIS — E1122 Type 2 diabetes mellitus with diabetic chronic kidney disease: Secondary | ICD-10-CM | POA: Diagnosis not present

## 2020-11-06 DIAGNOSIS — K219 Gastro-esophageal reflux disease without esophagitis: Secondary | ICD-10-CM | POA: Diagnosis not present

## 2020-11-06 DIAGNOSIS — Z20822 Contact with and (suspected) exposure to covid-19: Secondary | ICD-10-CM | POA: Diagnosis not present

## 2020-11-06 DIAGNOSIS — R1011 Right upper quadrant pain: Secondary | ICD-10-CM

## 2020-11-06 DIAGNOSIS — N1831 Chronic kidney disease, stage 3a: Secondary | ICD-10-CM | POA: Insufficient documentation

## 2020-11-06 DIAGNOSIS — Z794 Long term (current) use of insulin: Secondary | ICD-10-CM | POA: Diagnosis not present

## 2020-11-06 DIAGNOSIS — R112 Nausea with vomiting, unspecified: Secondary | ICD-10-CM | POA: Diagnosis not present

## 2020-11-06 DIAGNOSIS — Z7901 Long term (current) use of anticoagulants: Secondary | ICD-10-CM | POA: Insufficient documentation

## 2020-11-06 DIAGNOSIS — E11319 Type 2 diabetes mellitus with unspecified diabetic retinopathy without macular edema: Secondary | ICD-10-CM | POA: Diagnosis not present

## 2020-11-06 DIAGNOSIS — I2510 Atherosclerotic heart disease of native coronary artery without angina pectoris: Secondary | ICD-10-CM | POA: Diagnosis not present

## 2020-11-06 DIAGNOSIS — Z87891 Personal history of nicotine dependence: Secondary | ICD-10-CM | POA: Insufficient documentation

## 2020-11-06 DIAGNOSIS — B029 Zoster without complications: Secondary | ICD-10-CM

## 2020-11-06 DIAGNOSIS — Z7984 Long term (current) use of oral hypoglycemic drugs: Secondary | ICD-10-CM | POA: Diagnosis not present

## 2020-11-06 DIAGNOSIS — N390 Urinary tract infection, site not specified: Secondary | ICD-10-CM

## 2020-11-06 DIAGNOSIS — E1140 Type 2 diabetes mellitus with diabetic neuropathy, unspecified: Secondary | ICD-10-CM | POA: Diagnosis not present

## 2020-11-06 DIAGNOSIS — R319 Hematuria, unspecified: Secondary | ICD-10-CM

## 2020-11-06 DIAGNOSIS — R109 Unspecified abdominal pain: Secondary | ICD-10-CM | POA: Diagnosis present

## 2020-11-06 LAB — CBC
HCT: 36.5 % (ref 36.0–46.0)
Hemoglobin: 11.8 g/dL — ABNORMAL LOW (ref 12.0–15.0)
MCH: 28.1 pg (ref 26.0–34.0)
MCHC: 32.3 g/dL (ref 30.0–36.0)
MCV: 86.9 fL (ref 80.0–100.0)
Platelets: 359 10*3/uL (ref 150–400)
RBC: 4.2 MIL/uL (ref 3.87–5.11)
RDW: 16.8 % — ABNORMAL HIGH (ref 11.5–15.5)
WBC: 7.9 10*3/uL (ref 4.0–10.5)
nRBC: 0 % (ref 0.0–0.2)

## 2020-11-06 LAB — BASIC METABOLIC PANEL
Anion gap: 10 (ref 5–15)
BUN: 19 mg/dL (ref 8–23)
CO2: 25 mmol/L (ref 22–32)
Calcium: 9.8 mg/dL (ref 8.9–10.3)
Chloride: 101 mmol/L (ref 98–111)
Creatinine, Ser: 1.11 mg/dL — ABNORMAL HIGH (ref 0.44–1.00)
GFR, Estimated: 50 mL/min — ABNORMAL LOW (ref >60)
Glucose, Bld: 145 mg/dL — ABNORMAL HIGH (ref 70–99)
Potassium: 4 mmol/L (ref 3.5–5.1)
Sodium: 136 mmol/L (ref 135–145)

## 2020-11-06 LAB — URINALYSIS, COMPLETE (UACMP) WITH MICROSCOPIC
Glucose, UA: NEGATIVE mg/dL
Hgb urine dipstick: NEGATIVE
Nitrite: NEGATIVE
Protein, ur: 100 mg/dL — AB
Specific Gravity, Urine: >1.03 — ABNORMAL HIGH (ref 1.005–1.030)
pH: 5.5 (ref 5.0–8.0)

## 2020-11-06 LAB — RESP PANEL BY RT-PCR (FLU A&B, COVID) ARPGX2
Influenza A by PCR: NEGATIVE
Influenza B by PCR: NEGATIVE
SARS Coronavirus 2 by RT PCR: NEGATIVE

## 2020-11-06 LAB — CK: Total CK: 54 U/L (ref 38–234)

## 2020-11-06 LAB — HEPATIC FUNCTION PANEL
ALT: 12 U/L (ref 0–44)
AST: 15 U/L (ref 15–41)
Albumin: 3.8 g/dL (ref 3.5–5.0)
Alkaline Phosphatase: 55 U/L (ref 38–126)
Bilirubin, Direct: 0.1 mg/dL (ref 0.0–0.2)
Indirect Bilirubin: 0.9 mg/dL (ref 0.3–0.9)
Total Bilirubin: 1 mg/dL (ref 0.3–1.2)
Total Protein: 7.6 g/dL (ref 6.5–8.1)

## 2020-11-06 LAB — TROPONIN I (HIGH SENSITIVITY): Troponin I (High Sensitivity): 7 ng/L (ref <18)

## 2020-11-06 LAB — LIPASE, BLOOD: Lipase: 52 U/L — ABNORMAL HIGH (ref 11–51)

## 2020-11-06 MED ORDER — MORPHINE SULFATE (PF) 4 MG/ML IV SOLN
4.0000 mg | Freq: Once | INTRAVENOUS | Status: AC
Start: 2020-11-06 — End: 2020-11-06
  Administered 2020-11-06: 4 mg via INTRAVENOUS
  Filled 2020-11-06: qty 1

## 2020-11-06 MED ORDER — SODIUM CHLORIDE 0.9 % IV BOLUS
1000.0000 mL | Freq: Once | INTRAVENOUS | Status: AC
  Administered 2020-11-06: 1000 mL via INTRAVENOUS

## 2020-11-06 MED ORDER — VALACYCLOVIR HCL 1 G PO TABS: 1000.0000 mg | ORAL_TABLET | Freq: Three times a day (TID) | ORAL | 0 refills | Status: AC

## 2020-11-06 MED ORDER — VALACYCLOVIR HCL 1 G PO TABS: 1000.0000 mg | ORAL_TABLET | Freq: Three times a day (TID) | ORAL | 0 refills | Status: DC

## 2020-11-06 MED ORDER — FOSFOMYCIN TROMETHAMINE 3 G PO PACK
3.0000 g | PACK | Freq: Once | ORAL | Status: AC
  Administered 2020-11-06: 3 g via ORAL
  Filled 2020-11-06: qty 3

## 2020-11-06 MED ORDER — ONDANSETRON 4 MG PO TBDP: 4.0000 mg | ORAL_TABLET | Freq: Three times a day (TID) | ORAL | 0 refills | Status: DC | PRN

## 2020-11-06 MED ORDER — TRAMADOL HCL 50 MG PO TABS: 50.0000 mg | ORAL_TABLET | Freq: Four times a day (QID) | ORAL | 0 refills | Status: DC | PRN

## 2020-11-06 MED ORDER — ONDANSETRON 4 MG PO TBDP
4.0000 mg | ORAL_TABLET | Freq: Once | ORAL | Status: AC
  Administered 2020-11-06: 4 mg via ORAL
  Filled 2020-11-06: qty 1

## 2020-11-06 MED ORDER — ONDANSETRON HCL 4 MG/2ML IJ SOLN
4.0000 mg | Freq: Once | INTRAMUSCULAR | Status: AC
  Administered 2020-11-06: 4 mg via INTRAVENOUS
  Filled 2020-11-06: qty 2

## 2020-11-06 NOTE — ED Provider Notes (Addendum)
[facility] Emergency Department Provider Note  ____________________________________________  Time seen: Approximately 11:08 AM  I have reviewed the triage vital signs and the nursing notes.   HISTORY  Chief Complaint Chest Pain    HPI Courtney Pena is a 80 y.o. female with past history of atrial fibrillation CAD hypertension who comes ED complaining of right flank pain radiating to the groin, started 2 days ago, waxing and waning, no aggravating or alleviating factors.  Currently 10/10 in intensity.  Associate with nausea vomiting.  No chest pain.  Denies significant shortness of breath.  No falls or trauma.  No exertional or pleuritic symptoms.  Patient took several COVID rapid antigen test at home which were negative  Past Medical History:  Diagnosis Date   Allergy    Anemia due to GI blood loss 08/12/2011   Arthritis    Atrial flutter ([place]) 12-07-2010   converted in ED with 300 mg flecainide   CAD (coronary artery disease)    a. mild per cath in 2004;  b. nonischemic Myoview in March 2012;  c. Lex MV 1/14:  EF 66%, no ischemia   Cataract    Chronic anticoagulation - Xarelto started 07/07/2015, CHADS2CVASC=5 07/07/2015   Clotting disorder ([place])    Diastolic CHF ([place]) 6/43/3295   External hemorrhoids 06/07/2010   Gastric antral vascular ectasia    source for gi bleed in 07/2011 - Xarelto stopped   Gastritis    GERD (gastroesophageal reflux disease)    Hiatal hernia    Hyperlipidemia    Hypertension    Hypothyroidism    Neuromuscular disorder ([place])    Obesity    Paroxysmal atrial fibrillation ([place])    Personal history of colonic polyps 06/06/2009   cecal polyp   Sleep apnea    does not use CPAP   Type 2 diabetes mellitus with diabetic chronic kidney disease ([place]) 04/25/2006       Ulcer      Patient Active Problem List   Diagnosis Date Noted   Secondary hypercoagulable state ([place]) 10/03/2020   Rheumatoid arthritis ([place])  03/21/2020   Tinnitus 03/21/2020   Left breast mass 12/28/2019   CKD (chronic kidney disease), stage IIIa 12/18/2019   Idiopathic chronic gout of multiple sites without tophus 12/09/2019   Lumbar back pain with radiculopathy affecting left lower extremity 07/30/2019   Preventative health care 11/24/2018   History of CVA (cerebrovascular accident) 11/24/2018   Rotator cuff syndrome of right shoulder 11/24/2018   Hypertensive retinopathy of both eyes 10/14/2018   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 10/14/2018   Vitreomacular adhesion of left eye 10/14/2018   Lower abdominal pain 08/28/2018   Myalgia due to statin 08/28/2018   Diabetic retinopathy, nonproliferative, moderate ([place]) 03/07/2018   Headache 11/28/2017   Chronic diastolic CHF (congestive heart failure) ([place]) 08/16/2017   Right flank pain 03/13/2017   Diabetic neuropathy ([place]) 10/17/2016   Sudden hearing loss, left 09/25/2016   Transaminitis 11/10/2015   Chronic anticoag - Xarelto 05//2017, CHADS2CVASC=5 Eliquis 9/20 07/07/2015   Left sided chest pain 05/12/2015   Fatigue 06/24/2014   Chronic gout of multiple sites 03/23/2014   Degenerative arthritis of thumb 02/12/2014   Vitamin D deficiency 09/18/2013   Hyperlipidemia associated with type 2 diabetes mellitus ([place]) 08/22/2012   Pseudophakia, both eyes 03/12/2012   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   Anemia due to GI blood loss 08/12/2011   Pulmonary nodule 12/26/2010   Paroxysmal atrial fibrillation ([place]) 12/14/2010  After-cataract obscuring vision 11/20/2010   Hypothyroidism 04/25/2006   Type 2 diabetes mellitus with diabetic chronic kidney disease ([place]) 04/25/2006   HYPERCHOLESTEROLEMIA 04/25/2006   Obesity (BMI 30.0-34.9) 04/25/2006   HYPERTENSION, BENIGN SYSTEMIC 04/25/2006   Coronary atherosclerosis 04/25/2006   Reflux esophagitis 04/25/2006   DIVERTICULOSIS OF COLON 04/25/2006   DJD, UNSPECIFIED 04/25/2006   VERTIGO NOS OR DIZZINESS  04/25/2006   Sleep apnea 04/25/2006     Past Surgical History:  Procedure Laterality Date   BREAST BIOPSY Left    BREAST EXCISIONAL BIOPSY Left    benign   CARDIAC CATHETERIZATION  1999&2004   CARPAL TUNNEL RELEASE Bilateral 2003   CATARACT EXTRACTION Bilateral    COLONOSCOPY     ESOPHAGOGASTRODUODENOSCOPY  08/13/2011   Procedure: ESOPHAGOGASTRODUODENOSCOPY (EGD);  Surgeon: Gatha Mayer, MD;  Location: [facility] ENDOSCOPY;  Service: Endoscopy;  Laterality: N/A;   implantable loop recorder placement  07/20/2020   Medtronic Reveal Linq model LNQ 22 ([place] [id number]) implantable loop recorder by Dr Rayann Heman   POLYPECTOMY       Prior to Admission medications   Medication Sig Start Date End Date Taking? Authorizing Provider  ondansetron (ZOFRAN ODT) 4 MG disintegrating tablet Take 1 tablet (4 mg total) by mouth every 8 (eight) hours as needed for nausea or vomiting. 11/06/20  Yes Nena Polio, MD  traMADol (ULTRAM) 50 MG tablet Take 1 tablet (50 mg total) by mouth every 6 (six) hours as needed. 11/06/20 11/06/21 Yes Nena Polio, MD  acetaminophen (TYLENOL) 650 MG CR tablet Take 650 mg by mouth every 8 (eight) hours as needed for pain.    [provider]  apixaban (ELIQUIS) 5 MG TABS tablet Take 1 tablet (5 mg total) by mouth 2 (two) times daily. 03/21/20   [name], MD  Apoaequorin (PREVAGEN PO) Take 1 tablet by mouth.    [provider]  blood glucose meter kit and supplies Dispense based on patient and insurance preference. Use up to four times daily as directed. (FOR ICD-10 E10.9, E11.9). 09/14/19   [name], MD  cetirizine (ZYRTEC) 10 MG tablet Take 10 mg by mouth as needed for allergies. Takes 10 mg as needed for sinus trouble    [provider]  Cholecalciferol (VITAMIN D3) 2000 units TABS Take 2 capsules by mouth daily.    [provider]  Coenzyme Q10 (COQ10) 100 MG CAPS Take 100 mEq by mouth daily.  06/28/05   [provider]   colchicine 0.6 MG tablet Take 1 tablet by mouth as needed.    [provider]  diclofenac Sodium (VOLTAREN) 1 % GEL Apply topically as needed (pain).    [provider]  diltiazem (CARDIZEM CD) 360 MG 24 hr capsule TAKE 1 CAPSULE BY MOUTH  DAILY 04/26/20   Hensel, Jamal Collin, MD  diltiazem (CARDIZEM) 30 MG tablet Take 1 tablet (30 mg total) by mouth every 4 (four) hours as needed (elevated HR as long as BP > 100). 12/30/19   Sherran Needs, NP  ferrous sulfate 325 (65 FE) MG EC tablet Take 650 mg by mouth daily.     [provider]  glipiZIDE (GLUCOTROL XL) 10 MG 24 hr tablet Take 1 tablet (10 mg total) by mouth 2 (two) times daily. Take once daily, if blood sugars remain elevated > 200, increase to twice daily. 09/29/20   Leavy Cella, RPH-CPP  glucose blood (ONETOUCH ULTRA) test strip 1 Container by Other route every morning. 05/21/16  [provider]  glucose blood test strip Use to test blood sugar 3 times daily. 12/14/19   [name], MD  Insulin Pen Needle 31G X 8 MM MISC Use with pen to inject once daily. 05/13/18   [name], MD  Lancets [facility] ULTRASOFT) lancets Use to check blood sugar daily. 02/13/17   [name], MD  leflunomide (ARAVA) 10 MG tablet Take 10 mg by mouth daily. 04/13/20   [provider]  levothyroxine (SYNTHROID) 112 MCG tablet Take 112 mcg by mouth daily before breakfast.    [provider]  meclizine (ANTIVERT) 25 MG tablet TAKE 1 TABLET (25 MG TOTAL) BY MOUTH AS NEEDED. 09/29/20   [name], MD  metFORMIN (GLUCOPHAGE-XR) 500 MG 24 hr tablet TAKE 4 TABLETS BY MOUTH  DAILY 04/26/20   [name], MD  metoprolol succinate (TOPROL-XL) 100 MG 24 hr tablet TAKE 1 TABLET BY MOUTH 2  TIMES DAILY. TAKE WITH OR  IMMEDIATELY FOLLOWING A  MEAL. 12/18/19   Ivor Costa, MD  Multiple Vitamin (MULTI-VITAMINS) TABS Take daily by mouth. 06/28/05   [provider]  nitroGLYCERIN (NITROSTAT) 0.4 MG  SL tablet Place 1 tablet (0.4 mg total) under the tongue every 5 (five) minutes as needed for chest pain (Call 911 if chest pain after three doses). 09/14/19   [name], MD  omeprazole (PRILOSEC) 40 MG capsule TAKE 1 CAPSULE BY MOUTH  DAILY 02/10/20   [name], MD  rosuvastatin (CRESTOR) 10 MG tablet TAKE 1 TABLET BY MOUTH EVERY DAY 01/18/20   [name], MD  Semaglutide, 1 MG/DOSE, 2 MG/1.5ML SOPN Inject 1 mg into the skin once a week. 03/21/20   [name], MD  triamcinolone cream (KENALOG) 0.1 % Apply 1 application topically as needed (dermatitis).    [provider]  valACYclovir (VALTREX) 1000 MG tablet Take 1 tablet (1,000 mg total) by mouth 3 (three) times daily for 7 days. 11/06/20 11/13/20  Nena Polio, MD     Allergies Actos [pioglitazone], Lisinopril, Nsaids, Penicillins, Sulfamethoxazole, Flecainide, Hctz [hydrochlorothiazide], Iodinated diagnostic agents, Jardiance [empagliflozin], Statins, Gabapentin, Losartan potassium, and Zetia [ezetimibe]   Family History  Problem Relation Age of Onset   Heart disease Father    Diabetes Maternal Grandfather    Hypertension Mother    Other Mother        brain tumor-benign   Diabetes Daughter        pre-diabeties   Colon polyps Daughter    Thyroid disease Daughter        x 2   Colon cancer Neg Hx    Esophageal cancer Neg Hx    Stomach cancer Neg Hx    Rectal cancer Neg Hx     Social History Social History   Tobacco Use   Smoking status: Former    Packs/day: 2.00    Years: 30.00    Pack years: 60.00    Types: Cigarettes    Quit date: 02/26/1993    Years since quitting: 27.7   Smokeless tobacco: Never  Vaping Use   Vaping Use: Never used  Substance Use Topics   Alcohol use: No    Alcohol/week: 0.0 standard drinks   Drug use: No    Review of Systems  Constitutional:   No fever or chills.  ENT:   No sore throat. No rhinorrhea. Cardiovascular:   No chest pain or  syncope. Respiratory:   No dyspnea or cough. Gastrointestinal:   Positive  right flank pain and vomiting as above..  Musculoskeletal:   Negative for focal pain or swelling All other systems reviewed and are negative except as documented above in ROS and HPI.  ____________________________________________   PHYSICAL EXAM:  VITAL SIGNS: ED Triage Vitals  Enc Vitals Group     BP 11/06/20 1023 127/87     Pulse Rate 11/06/20 1023 79     Resp 11/06/20 1023 18     Temp 11/06/20 1023 98 F (36.7 C)     Temp src --      SpO2 11/06/20 1023 99 %     Weight --      Height --      Head Circumference --      Peak Flow --      Pain Score 11/06/20 1021 10     Pain Loc --      Pain Edu? --      Excl. in [place]? --     Vital signs reviewed, nursing assessments reviewed.   Constitutional:   Alert and oriented. Non-toxic appearance. Eyes:   Conjunctivae are normal. EOMI. PERRL. ENT      Head:   Normocephalic and atraumatic.      Nose:   Wearing a mask.      Mouth/Throat:   Wearing a mask.      Neck:   No meningismus. Full ROM. Hematological/Lymphatic/Immunilogical:   No cervical lymphadenopathy. Cardiovascular:   RRR, sinus rhythm on monitor. Symmetric bilateral radial and DP pulses.  No murmurs. Cap refill less than 2 seconds. Respiratory:   Normal respiratory effort without tachypnea/retractions. Breath sounds are clear and equal bilaterally. No wheezes/rales/rhonchi. Gastrointestinal:   Soft with mild right lower quadrant tenderness. Non distended. There is no CVA tenderness.  No rebound, rigidity, or guarding. Genitourinary:   deferred Musculoskeletal:   Normal range of motion in all extremities. No joint effusions.  No lower extremity tenderness.  No edema. Neurologic:   Normal speech and language.  Motor grossly intact. No acute focal neurologic deficits are appreciated.  Skin:    Skin is warm, dry and intact. No rash noted.  No petechiae, purpura, or  bullae.  ____________________________________________    LABS (pertinent positives/negatives) (all labs ordered are listed, but only abnormal results are displayed) Labs Reviewed  BASIC METABOLIC PANEL - Abnormal; Notable for the following components:      Result Value   Glucose, Bld 145 (*)    Creatinine, Ser 1.11 (*)    GFR, Estimated 50 (*)    All other components within normal limits  CBC - Abnormal; Notable for the following components:   Hemoglobin 11.8 (*)    RDW 16.8 (*)    All other components within normal limits  LIPASE, BLOOD - Abnormal; Notable for the following components:   Lipase 52 (*)    All other components within normal limits  URINALYSIS, COMPLETE (UACMP) WITH MICROSCOPIC - Abnormal; Notable for the following components:   Specific Gravity, Urine >1.030 (*)    Bilirubin Urine SMALL (*)    Ketones, ur TRACE (*)    Protein, ur 100 (*)    Leukocytes,Ua MODERATE (*)    Bacteria, UA RARE (*)    All other components within normal limits  RESP PANEL BY RT-PCR (FLU A&B, COVID) ARPGX2  HEPATIC FUNCTION PANEL  CK  TROPONIN I (HIGH SENSITIVITY)   ____________________________________________   EKG  Interpreted by me Sinus rhythm rate of 81, normal axis.  First-degree AV block.  Poor R wave  progression.  Normal ST segments and T waves.  No ischemic changes.  ____________________________________________    RADIOLOGY  No results found.  ____________________________________________   PROCEDURES .1-3 Lead EKG Interpretation Performed by: Carrie Mew, MD Authorized by: Carrie Mew, MD     Interpretation: normal     ECG rate:  75   ECG rate assessment: normal     Rhythm: sinus rhythm     Ectopy: none     Conduction: normal    ____________________________________________  DIFFERENTIAL DIAGNOSIS   Ureterolithiasis, cystitis, pyelonephritis, appendicitis, bowel obstruction, biliary disease, pancreatitis, dehydration, viral  illness  CLINICAL IMPRESSION / ASSESSMENT AND PLAN / ED COURSE  Medications ordered in the ED: Medications  ondansetron (ZOFRAN) injection 4 mg (4 mg Intravenous Given 11/06/20 1103)  morphine 4 MG/ML injection 4 mg (4 mg Intravenous Given 11/06/20 1103)  sodium chloride 0.9 % bolus 1,000 mL (0 mLs Intravenous Stopped 11/06/20 1700)  fosfomycin (MONUROL) packet 3 g (3 g Oral Given 11/06/20 1643)  ondansetron (ZOFRAN-ODT) disintegrating tablet 4 mg (4 mg Oral Given 11/06/20 1710)    Pertinent labs & imaging results that were available during my care of the patient were reviewed by me and considered in my medical decision making (see chart for details).  Courtney Pena was evaluated in Emergency Department on 11/12/2020 for the symptoms described in the history of present illness. She was evaluated in the context of the global COVID-19 pandemic, which necessitated consideration that the patient might be at risk for infection with the SARS-CoV-2 virus that causes COVID-19. Institutional protocols and algorithms that pertain to the evaluation of patients at risk for COVID-19 are in a state of rapid change based on information released by regulatory bodies including the CDC and federal and state organizations. These policies and algorithms were followed during the patient's care in the ED.   Patient presents with right flank pain, vomiting.  Afebrile with normal vital signs, sinus rhythm, overall nontoxic appearance.  Doubt AAA dissection or mesenteric ischemia.  Will obtain CT scan and labs, give IV fluids morphine 4 mg IV and Zofran for additional pain and nausea relief.  Clinical Course as of 11/12/20 2106  Nancy Fetter Nov 06, 2020  1342 Labs reassuring. CT negative. CXR = nad.  [PS]    Clinical Course User Index [PS] Carrie Mew, MD     ____________________________________________   FINAL CLINICAL IMPRESSION(S) / ED DIAGNOSES    Final diagnoses:  RUQ pain  Urinary tract  infection with hematuria, site unspecified  Herpes zoster without complication     ED Discharge Orders          Ordered    traMADol (ULTRAM) 50 MG tablet  Every 6 hours PRN        11/06/20 1614    valACYclovir (VALTREX) 1000 MG tablet  3 times daily,   Status:  Discontinued        11/06/20 1614    ondansetron (ZOFRAN ODT) 4 MG disintegrating tablet  Every 8 hours PRN        11/06/20 1614    valACYclovir (VALTREX) 1000 MG tablet  3 times daily        11/06/20 1616            Portions of this note were generated with dragon dictation software. Dictation errors may occur despite best attempts at proofreading.    Carrie Mew, MD 11/06/20 1111    Carrie Mew, MD 11/12/20 2106

## 2020-11-06 NOTE — ED Triage Notes (Signed)
Pt comes with c/o CP that started last week. Pt states fluttering in her heart. Pt has hx of afib. Pt states this morning the SOB started.  Pt also states right sided rib pain. Pt states some nausea.

## 2020-11-06 NOTE — ED Notes (Signed)
[name] at bedside. Warm Blankets provided to pt.

## 2020-11-06 NOTE — ED Notes (Signed)
Pt presents to ED with c/o of nausea and "a-fib". 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   Pt states she is nauseous and is having R sided flank pain that radiates to R groin area, pt denies HX of kidney stones to this RN. Pt states she hasn't been able to eat or drink due to nausea. Pt reports tea colored urine and feeling as if she cant empty her bladder and states minimal urine output but also reports minimal intake of fluids. Pt states low grade fever at 99. Pt denies chills. Pt denies diarrhea. Daughter at bedside.

## 2020-11-06 NOTE — Discharge Instructions (Addendum)
The urine shows that you have a UTI.  I have given you a dose of fosfomycin here in the emergency room which should take care of it.  I have given you some Zofran orally disintegrating tablets.  These melt on your tongue.  You can use them for nausea or vomiting up to 3 times a day.  I have also given you some Ultram or tramadol.  You can use those for the pain 1 pill 4 times a day if need be.  Be careful they can make you sleepy or woozy.  Sometimes they also make people constipated.  Do not use them if you do not need them.  The pain you are having might be from shingles as your doctor suggested.  Because I agree with him and think it is likely I will give you something called Valacyclovir which is an antiviral.  You take 1 pill 3 times a day for a week.  That 7 days.  The prescription is generated by the computer and might say for 2 days but I want you to take it 3 times a day for 7 days.  For the nausea and vomiting use the Zofran and then about an hour after you use the Zofran start sipping clear liquids.  Take about a teaspoon every 5 or 10 minutes.  You can use noncitrus fruit juices like apple juice or pear juice, you can use flat soda or ginger ale or weak tea.  Jell-O or popsicles are also good.  After 4 or 5 hours of doing that try to eat some food.  You can use that B.R.A.T. diet which is bananas, rice, applesauce or toast.  You can also mix in some crackers or something very bland like Vienna sausage.  Just nibble on that again small amounts frequently for several hours.  If you do okay with that progress to eating a regular food but for the next day or 2 just small amounts frequently.  Please return for increasing pain, fever, vomiting and unable to take your medicines or if you get lightheaded or develop any other problems.  It is possible that the fosfomycin might not work.  Occasionally there is resistance to antibiotics or the infection might be worse than we thought.  If that happens you will need  to come back and we will need to give you something else.  Please follow-up with your doctor later on this coming week.

## 2020-11-06 NOTE — ED Notes (Signed)
Pt at CT

## 2020-11-06 NOTE — ED Notes (Signed)
Pt at XRAY

## 2020-11-06 NOTE — ED Provider Notes (Signed)
Patient's urinalysis is consistent with UTI.  Patient does not have any CVA tenderness or abdominal tenderness except for the right lower quadrant.  The ultrasound is negative.  On further evaluation the patient actually has more pain if she tenses the abdominal muscles by lifting her head and shoulders off the bed.  This makes it less likely that there is anything going inside the abdomen.  Additionally the skin is tender to light touch.  The patient reports that earlier today she did not want to wear any clothes because a close touching the skin made it hurt.  This could be a sign of shingles going on.  Her doctor apparently also thought so.  There is no rash at this point but it is possible.  I will give her some fosfomycin for the UTI as she had a bad allergy to penicillin with swelling and other problems and she also gets hives with sulfa.  She does have diabetes and occasionally Cipro will make the blood sugar drop.  So I will not use that currently.  I will give the patient some Ultram if need be for the pain and Zofran for the nausea and vomiting which currently is gone.  I will give her valacyclovir for the possibility of shingles.  She will return if she has any further problems or gets worse or cannot tolerate her medication.   Nena Polio, MD 11/06/20 1622

## 2020-11-07 ENCOUNTER — Telehealth: Payer: Self-pay | Admitting: *Deleted

## 2020-11-07 NOTE — Telephone Encounter (Signed)
Now has rash typical of shingles.  Already begun on valcyclovir.  Answered questions.  Still in considerable pain.

## 2020-11-07 NOTE — Telephone Encounter (Signed)
Pt calling to speak to Dr. Andria Frames. She went to ED yesterday. Thinks she has shingles. Pt is miserable. Please call pt to discuss treatment given yesterday. Ottis Stain, CMA

## 2020-11-07 NOTE — Telephone Encounter (Signed)
Pt wanting dr Andria Frames to call her because the issues she was seen for arent getting better. Please advise. Courtney Pena, CMA

## 2020-11-09 NOTE — Telephone Encounter (Signed)
Patient calling to speak to Dr. Andria Frames. She states her shingles is getting worse and would like for him to call her to discuss  what to do about this.

## 2020-11-09 NOTE — Progress Notes (Signed)
[facility] Summary Report / Loop Recorder 

## 2020-11-09 NOTE — Telephone Encounter (Signed)
Not what I expected.  She states that she awoke this morning with bilateral foot pain, Left>right so painful that she can't walk.  No trauma.  No redness or swelling, just pain.  Chest pain is actually improving.  I reviewed valtrex side effects and I doubt this is causing.  She does have gout on her problem list.  Admits to poor PO intake over last few days with her shingles.  The most cohesive story I can assemble is that shingles caused poor po intake resulting in mild dehydration, which preciptitated and acute gout flare.  She has colchicine at home and will start on tonignt.  She will update me with her progress

## 2020-11-10 ENCOUNTER — Telehealth: Payer: Self-pay

## 2020-11-10 NOTE — Telephone Encounter (Signed)
Informed pt that pap medication is ready for pickup.  Ozempic is ready & labeled in med room fridge

## 2020-11-15 NOTE — Telephone Encounter (Signed)
Noted and agree.  I look forward to seeing patient on 9/22.

## 2020-11-15 NOTE — Telephone Encounter (Signed)
Patients daughter calls nurse line to give update on mothers progress. Sharyn Lull reports she is doing "some what better" since last week. However, is still having poor po intake. Sharyn Lull reports she is drinking ~2 water bottles a day and eating very litter. Sharyn Lull reports she is going to the bathroom normally. Her sister is  going over there today to check on her and help give her shower. Patient does have an apt with PCP on 9/22.   ED precautions given to [facility] for fluids.

## 2020-11-17 ENCOUNTER — Ambulatory Visit: Payer: Medicare Other | Admitting: Family Medicine

## 2020-11-17 ENCOUNTER — Other Ambulatory Visit: Payer: Self-pay

## 2020-11-17 ENCOUNTER — Encounter: Payer: Self-pay | Admitting: Family Medicine

## 2020-11-17 VITALS — BP 128/70 | HR 83 | Ht 66.0 in | Wt 181.6 lb

## 2020-11-17 DIAGNOSIS — E039 Hypothyroidism, unspecified: Secondary | ICD-10-CM

## 2020-11-17 DIAGNOSIS — B029 Zoster without complications: Secondary | ICD-10-CM

## 2020-11-17 DIAGNOSIS — E1122 Type 2 diabetes mellitus with diabetic chronic kidney disease: Secondary | ICD-10-CM | POA: Diagnosis not present

## 2020-11-17 LAB — POCT GLYCOSYLATED HEMOGLOBIN (HGB A1C): HbA1c, POC (controlled diabetic range): 6.9 % (ref 0.0–7.0)

## 2020-11-17 MED ORDER — GABAPENTIN 100 MG PO CAPS: 100.0000 mg | ORAL_CAPSULE | Freq: Every day | ORAL | 3 refills | Status: DC

## 2020-11-17 MED ORDER — LEVOTHYROXINE SODIUM 112 MCG PO TABS: 112.0000 ug | ORAL_TABLET | Freq: Every day | ORAL | 3 refills | Status: DC

## 2020-11-17 NOTE — Patient Instructions (Signed)
Once the rash heals up, you can use the Voltaren gel or lidocaine topical for the pain.   OK to get the flu shot and Covid booster in a few weeks. I would put off the shingles vaccine for 3-6 months.

## 2020-11-17 NOTE — Assessment & Plan Note (Signed)
Resolving.  At two weeks, we can begin talking about post herpetic neuralgia rather than the pain of acute zoster. Voltaren gel topically once rash has healed.   Gabapentin, low dose at night.

## 2020-11-17 NOTE — Assessment & Plan Note (Signed)
Refilled synthyroid as requested

## 2020-11-17 NOTE — Progress Notes (Signed)
    SUBJECTIVE:   CHIEF COMPLAINT / HPI:   FU acute herpes zoster.  First contact was by phone on 9/8.  Rash did break out on left back and abd.  Treated with valcyclovir.  Rash is drying well.  No change in bowel or bladder.  Pain is outragious.   Will get flu shot today. DM: A1C= 6.9    OBJECTIVE:   BP 128/70   Pulse 83   Ht 5\' 6"  (1.676 m)   Wt 181 lb 9.6 oz (82.4 kg)   SpO2 99%   BMI 29.31 kg/m   Abd benign. Skin, drying ulcerated rash typical of resolving zoster.  No secondary infection.  ASSESSMENT/PLAN:   Shingles Resolving.  At two weeks, we can begin talking about post herpetic neuralgia rather than the pain of acute zoster. Voltaren gel topically once rash has healed.   Gabapentin, low dose at night.  Type 2 diabetes mellitus with diabetic chronic kidney disease ([place]) At goal.  Glipizide only used when had steroid injection.  DC from problem list.       [name], MD [place]

## 2020-11-17 NOTE — Assessment & Plan Note (Signed)
At goal.  Glipizide only used when had steroid injection.  DC from problem list.

## 2020-11-21 ENCOUNTER — Other Ambulatory Visit: Payer: Self-pay | Admitting: Family Medicine

## 2020-11-21 ENCOUNTER — Ambulatory Visit: Payer: Medicare Other | Admitting: Physical Therapy

## 2020-11-21 DIAGNOSIS — E1122 Type 2 diabetes mellitus with diabetic chronic kidney disease: Secondary | ICD-10-CM

## 2020-11-21 DIAGNOSIS — I10 Essential (primary) hypertension: Secondary | ICD-10-CM

## 2020-11-21 DIAGNOSIS — I480 Paroxysmal atrial fibrillation: Secondary | ICD-10-CM

## 2020-11-22 ENCOUNTER — Telehealth: Payer: Self-pay | Admitting: Family Medicine

## 2020-11-22 NOTE — Telephone Encounter (Signed)
Courtney Pena would like Dr Andria Frames to give her a call back regarding a swollen part of her body due to shingles.

## 2020-11-23 ENCOUNTER — Ambulatory Visit: Payer: Medicare Other | Admitting: Family Medicine

## 2020-11-23 ENCOUNTER — Other Ambulatory Visit: Payer: Self-pay

## 2020-11-23 ENCOUNTER — Encounter: Payer: Self-pay | Admitting: Family Medicine

## 2020-11-23 VITALS — BP 122/75 | HR 75 | Ht 66.0 in | Wt 180.0 lb

## 2020-11-23 DIAGNOSIS — B029 Zoster without complications: Secondary | ICD-10-CM | POA: Diagnosis not present

## 2020-11-23 MED ORDER — AMITRIPTYLINE HCL 10 MG PO TABS: 10.0000 mg | ORAL_TABLET | Freq: Every day | ORAL | 1 refills | Status: DC

## 2020-11-23 MED ORDER — LIDOCAINE 5 % EX OINT: 1.0000 "application " | TOPICAL_OINTMENT | CUTANEOUS | 0 refills | Status: DC | PRN

## 2020-11-23 NOTE — Progress Notes (Signed)
    SUBJECTIVE:   CHIEF COMPLAINT / HPI:   Swelling and pain post shingles: 79 year old female presenting for the above.  She was previously treated with valacyclovir due to acute herpes zoster outbreak.  This was on 9/22.  She states that since then she is still having pain in her right side in the region of the shingles outbreak.  She states that she has some swelling in that region.  She continues to have pain and so she is following up today.   PERTINENT  PMH / PSH: Recent acute shingles  OBJECTIVE:   BP 122/75   Pulse 75   Ht 5\' 6"  (1.676 m)   Wt 180 lb (81.6 kg)   SpO2 100%   BMI 29.05 kg/m    General: NAD, pleasant, able to participate in exam Cardiac: RRR, no murmurs. Respiratory: CTAB, normal effort, No wheezes, rales or rhonchi Abdomen: Bowel sounds present, some superficial discomfort on the right side in dermatome pattern with shingles lesions as documented below.  No worsening of pain with deep palpation. Skin: Several lesions present in dermatomal pattern extending from the left posterior side around abdomen.  She has some soft tissue swelling on the anterior aspect of the abdomen in that region with no lesions yet present.  She has pain with soft palpation but it does not seem to worsen with deeper palpation. Neuro: alert, no obvious focal deficits Psych: Normal affect and mood  ASSESSMENT/PLAN:   Shingles Presenting for follow-up with shingles.  Patient with pain and swelling on her left abdomen with some lesions present on the posterior aspect of her abdomen.  Patient with active shingles infection.  The pain seems to be in the region of the same dermatome and possibly the dermatologist superior to it with no lesions yet present on the anterior aspect of the abdomen.  Pain seems to be worse with soft palpation then with deeper palpation.  Patient is not experiencing any nausea or vomiting to suggest gallbladder etiology.  Most likely the soft tissue swelling is due to  the shingles infection and she will likely have further outbreak of lesions in that region.  Low suspicion for deeper etiology such as gallbladder. She has been taking valacyclovir but is experiencing a lot of pain.  We will start with Elavil 10 mg at night.  We will also do lidocaine ointment.  Follow-up in 1 week to see how her pain is progressing.  Discussed return precautions   Lurline Del, [place]

## 2020-11-23 NOTE — Patient Instructions (Signed)
I believe the swelling in your abdomen is due to shingles and will likely have lesions pop up on it in the future.  We are prescribing a medication for you to take at night called amitriptyline which should help with the pain.  I am also prescribing a lidocaine ointment for you to put on the area.  Please do not put this on any open lesions but this can be helpful in areas which do not have the active lesions.  If you start experiencing any nausea or vomiting, fevers, worsening symptoms please return or go to the emergency department.  I would like for you to come back in about 1 week to see how you are doing.

## 2020-11-23 NOTE — Telephone Encounter (Signed)
I will assume that Dr. Vanessa Hulett will address her concerns during his visit.

## 2020-11-23 NOTE — Assessment & Plan Note (Addendum)
Presenting for follow-up with shingles.  Patient with pain and swelling on her left abdomen with some lesions present on the posterior aspect of her abdomen.  Patient with active shingles infection.  The pain seems to be in the region of the same dermatome and possibly the dermatologist superior to it with no lesions yet present on the anterior aspect of the abdomen.  Pain seems to be worse with soft palpation then with deeper palpation.  Patient is not experiencing any nausea or vomiting to suggest gallbladder etiology.  Most likely the soft tissue swelling is due to the shingles infection and she will likely have further outbreak of lesions in that region.  Low suspicion for deeper etiology such as gallbladder.  Discussed case with Dr. Owens Shark who also visualized the area.  She has been taking valacyclovir but is experiencing a lot of pain.  We will start with Elavil 10 mg at night.  We will also do lidocaine ointment.  Follow-up in 1 week to see how her pain is progressing.  Discussed return precautions

## 2020-12-01 ENCOUNTER — Other Ambulatory Visit: Payer: Self-pay | Admitting: Family Medicine

## 2020-12-05 ENCOUNTER — Other Ambulatory Visit: Payer: Self-pay

## 2020-12-05 ENCOUNTER — Encounter: Payer: Self-pay | Admitting: Family Medicine

## 2020-12-05 ENCOUNTER — Ambulatory Visit: Payer: Medicare Other | Admitting: Family Medicine

## 2020-12-05 VITALS — BP 125/73 | HR 74 | Ht 66.0 in | Wt 181.8 lb

## 2020-12-05 DIAGNOSIS — Z23 Encounter for immunization: Secondary | ICD-10-CM | POA: Diagnosis not present

## 2020-12-05 DIAGNOSIS — B0229 Other postherpetic nervous system involvement: Secondary | ICD-10-CM | POA: Diagnosis not present

## 2020-12-05 DIAGNOSIS — R6881 Early satiety: Secondary | ICD-10-CM

## 2020-12-05 DIAGNOSIS — M1A09X0 Idiopathic chronic gout, multiple sites, without tophus (tophi): Secondary | ICD-10-CM | POA: Diagnosis not present

## 2020-12-05 MED ORDER — PREGABALIN 25 MG PO CAPS: 25.0000 mg | ORAL_CAPSULE | Freq: Two times a day (BID) | ORAL | 0 refills | Status: DC

## 2020-12-05 MED ORDER — DICLOFENAC SODIUM 1 % EX GEL: 2.0000 g | CUTANEOUS | 3 refills | Status: DC | PRN

## 2020-12-05 MED ORDER — COLCHICINE 0.6 MG PO TABS: 0.6000 mg | ORAL_TABLET | ORAL | 6 refills | Status: DC | PRN

## 2020-12-05 NOTE — Patient Instructions (Addendum)
I ordered refills on colchicine and voltarin gel.  The lidocaine refill should already be there.   I would try the cochicine for your foot pain.   We can try you on lyrica for the nerve pain.  Call me and let me know if you can take or would like to try a higher dose.   See me in about 1 month.

## 2020-12-06 ENCOUNTER — Encounter: Payer: Self-pay | Admitting: Family Medicine

## 2020-12-06 DIAGNOSIS — R6881 Early satiety: Secondary | ICD-10-CM | POA: Insufficient documentation

## 2020-12-06 NOTE — Assessment & Plan Note (Signed)
Likely due to zoster.  FU one month to make sure not worsening.

## 2020-12-06 NOTE — Assessment & Plan Note (Signed)
Now that she is six weeks out, better to classify her pain as post herpetic neuralgia rather than acute zoster. Could not tolerate low dose gabapentin.  Trial of lyrica.

## 2020-12-06 NOTE — Assessment & Plan Note (Signed)
Trial of colchicine for her foot pain.  Continue use voltarin gel

## 2020-12-06 NOTE — Progress Notes (Signed)
    SUBJECTIVE:   CHIEF COMPLAINT / HPI:   6 weeks post initial dx of acute herpes zoster outbreak.  Rash has dried up and pain persists.   Zoster related pain in distribution of right flank and abd.  Still severe. Poor PO appetite.  Only tolerating small meals.  Began with Zoster outbreak.  Neither improving nor worsening.  Lab work done ~1 month about had reassuring CBC and cMP.  No bleeding or vomiting. Episodic foot pain.  Attacks one foot at a time.  Feeling the pain more in her ankles and thus did not relate to previous dx of gout.  Talks about abrupt onset without trauma or unusual activity.  Severe pain which lasts days.  Has prn colchicine but has not tried for these outbreaks.  She has gotten steroid injections, which tend to run her BS up. Uses voltarin gel with fair relief. No foot pain today Wants flu shot today.    OBJECTIVE:   BP 125/73   Pulse 74   Ht 5\' 6"  (1.676 m)   Wt 181 lb 12.8 oz (82.5 kg)   SpO2 97%   BMI 29.34 kg/m   Lungs clear Cardiac RRR without m or g Abd, hyperesthesia around healing rash. Feet normal   ASSESSMENT/PLAN:   Chronic gout of multiple sites Trial of colchicine for her foot pain.  Continue use voltarin gel  Post herpetic neuralgia Now that she is six weeks out, better to classify her pain as post herpetic neuralgia rather than acute zoster. Could not tolerate low dose gabapentin.  Trial of lyrica.  Early satiety Likely due to zoster.  FU one month to make sure not worsening.       [name], MD [place]

## 2020-12-15 ENCOUNTER — Other Ambulatory Visit: Payer: Self-pay | Admitting: Family Medicine

## 2020-12-15 ENCOUNTER — Telehealth: Payer: Self-pay

## 2020-12-15 MED ORDER — LIDOCAINE 5 % EX OINT: TOPICAL_OINTMENT | CUTANEOUS | 3 refills | Status: DC

## 2020-12-15 NOTE — Telephone Encounter (Signed)
The patient states she is feeling like she is in A-Fib. Her monitor has not updated in quite some time. I tried to help her but was unsuccessful. I called tech support to get additional help.  The patient need the nurse to review the transmission and send it to [employer].

## 2020-12-15 NOTE — Telephone Encounter (Signed)
Transmission received, patient in normal sinus rhythm PVC noted on presenting.  Last AF episode on 12/03/20 4hrs20min in duration

## 2020-12-19 ENCOUNTER — Telehealth: Payer: Self-pay | Admitting: *Deleted

## 2020-12-19 NOTE — Telephone Encounter (Signed)
Patient is calling to discuss a few concerns with one of her medications.  Will forward to [place] per patient request, as the may have spoken before.  Medora Roorda,CMA

## 2020-12-20 ENCOUNTER — Ambulatory Visit (HOSPITAL_COMMUNITY): Payer: Medicare Other | Admitting: Nurse Practitioner

## 2020-12-21 ENCOUNTER — Ambulatory Visit (HOSPITAL_COMMUNITY)
Admission: RE | Admit: 2020-12-21 | Discharge: 2020-12-21 | Disposition: A | Discharge status: Home / Self Care | Payer: Medicare Other | Source: Ambulatory Visit | Attending: Nurse Practitioner | Admitting: Nurse Practitioner

## 2020-12-21 ENCOUNTER — Other Ambulatory Visit: Payer: Self-pay

## 2020-12-21 VITALS — BP 140/76 | HR 76 | Ht 66.0 in | Wt 179.6 lb

## 2020-12-21 DIAGNOSIS — I130 Hypertensive heart and chronic kidney disease with heart failure and stage 1 through stage 4 chronic kidney disease, or unspecified chronic kidney disease: Secondary | ICD-10-CM | POA: Insufficient documentation

## 2020-12-21 DIAGNOSIS — E669 Obesity, unspecified: Secondary | ICD-10-CM | POA: Diagnosis not present

## 2020-12-21 DIAGNOSIS — Z882 Allergy status to sulfonamides status: Secondary | ICD-10-CM | POA: Insufficient documentation

## 2020-12-21 DIAGNOSIS — Z7984 Long term (current) use of oral hypoglycemic drugs: Secondary | ICD-10-CM | POA: Insufficient documentation

## 2020-12-21 DIAGNOSIS — Z91041 Radiographic dye allergy status: Secondary | ICD-10-CM | POA: Diagnosis not present

## 2020-12-21 DIAGNOSIS — Z6829 Body mass index (BMI) 29.0-29.9, adult: Secondary | ICD-10-CM | POA: Diagnosis not present

## 2020-12-21 DIAGNOSIS — Z79899 Other long term (current) drug therapy: Secondary | ICD-10-CM | POA: Insufficient documentation

## 2020-12-21 DIAGNOSIS — I5030 Unspecified diastolic (congestive) heart failure: Secondary | ICD-10-CM | POA: Diagnosis not present

## 2020-12-21 DIAGNOSIS — I2510 Atherosclerotic heart disease of native coronary artery without angina pectoris: Secondary | ICD-10-CM | POA: Insufficient documentation

## 2020-12-21 DIAGNOSIS — Z7901 Long term (current) use of anticoagulants: Secondary | ICD-10-CM | POA: Diagnosis not present

## 2020-12-21 DIAGNOSIS — Z833 Family history of diabetes mellitus: Secondary | ICD-10-CM | POA: Diagnosis not present

## 2020-12-21 DIAGNOSIS — Z888 Allergy status to other drugs, medicaments and biological substances status: Secondary | ICD-10-CM | POA: Insufficient documentation

## 2020-12-21 DIAGNOSIS — Z87891 Personal history of nicotine dependence: Secondary | ICD-10-CM | POA: Diagnosis not present

## 2020-12-21 DIAGNOSIS — Z79890 Hormone replacement therapy (postmenopausal): Secondary | ICD-10-CM | POA: Diagnosis not present

## 2020-12-21 DIAGNOSIS — E1122 Type 2 diabetes mellitus with diabetic chronic kidney disease: Secondary | ICD-10-CM | POA: Diagnosis not present

## 2020-12-21 DIAGNOSIS — D6869 Other thrombophilia: Secondary | ICD-10-CM

## 2020-12-21 DIAGNOSIS — I480 Paroxysmal atrial fibrillation: Secondary | ICD-10-CM | POA: Diagnosis present

## 2020-12-21 DIAGNOSIS — Z886 Allergy status to analgesic agent status: Secondary | ICD-10-CM | POA: Insufficient documentation

## 2020-12-21 DIAGNOSIS — N189 Chronic kidney disease, unspecified: Secondary | ICD-10-CM | POA: Diagnosis not present

## 2020-12-21 DIAGNOSIS — Z794 Long term (current) use of insulin: Secondary | ICD-10-CM | POA: Insufficient documentation

## 2020-12-21 DIAGNOSIS — Z880 Allergy status to penicillin: Secondary | ICD-10-CM | POA: Diagnosis not present

## 2020-12-21 DIAGNOSIS — Z8249 Family history of ischemic heart disease and other diseases of the circulatory system: Secondary | ICD-10-CM | POA: Insufficient documentation

## 2020-12-21 NOTE — Progress Notes (Signed)
Patient ID: Courtney Pena, female   DOB: [birth year], 80 y.o.   MRN: [id number]     Primary Care Physician: [name], MD Referring Physician: Dr. Scherrie Gerlach is a 80 y.o. 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 Johnsie Cancel and being seen 7/7 in afib clinic. Other than a few intermittent flutters, she has not noticed any sustained afib. Tolerating flecainide well.Tolerating  Xarelto, no bleeding issue, but is in do-nut hole and will help fill out assistance forms today to see if she will qualify for assistance. She did have a f/u ETT, for which she did not stay on the treadmill for a minute but no EKG changes were noted with exertion.  Returns to afib clinic 8/17, for c/o blurred vision. She states that she has noted vision change since starting flecainide but thought it would wear off. She stopped flecainide yesterday and vision is already better. She is in SR today and since starting flecainide, afib burden has been low. She also stopped losartan because she retained fluid with previous use.   Her PCP wanted her to try one more time to see if the S.E. occurred again and it did. Will f/u with PCP for BP.  F/u in afib clinc 01/1116. She is off flecainide. She saw Dr. Rayann Heman in August and was given options for ablation or Rythmol. She decide that she would not make any changes. She feels that she has not noted the irregularity related with afib but has noted a different irregularity. She had pac's on her EKG the other day and what she describes fits this pattern.   She asked to be seen in afib clinic today with a few concerns. She describes seconds to minutes of a sensation that will start in her stomach  and spread to her chest, feels a flutter and a pin prick. This may happen 2x a day. Usually at rest. She wanted  to report these symptoms but does not feel they are bad enough to change her approach. She had a stress test in 2017 which was low risk.  F/u in afib clinic 06/1416 for increase in afib burden. She will note 2x a week and she will return to SR within one hour with taking 30 mg cardizem.. She had been on flecainide in the past but did not tolerate. She would like to discuss  with Dr. Rayann Heman pursuing ablation. She did see him last August but was not ready to commit at that time.   F/u afib clinic 10/15/16. She has the same afib pattern, around 1-2x a week with 30 mg cardizem getting her back to SR within around 30 mins. She did have an appointment with Dr. Rayann Heman re ablation, but since burden was low, it was more wait and see. She is still happy to take this approach.  F/u 02/27/17, she is in the clinic for f/u of event monitor placed by Dr. Johnsie Cancel after increasing metoprolol to 100 mg bid. Monitor did not show any afib or significant PC burden. She does not report having to take any extra Cardizem in a while. continues on xarelto without issues.  F/u in afib clinic, 12/30/19.I have not seen pt for a number of years, afib has been quiet,  but she recently has an ER visit for afib with [facility]. She was started on Cardizem drip and  converted to SR. Per pt she had run out of metoprolol for a week prior to this occurring,waitijng for the drug to come by mail. She is now back on BB and is SR, no further afib.  She continues on eliquis for a CHA2DS2VASc of at least 6.   F/u in afib clinic, 12/21/20. She asked to be seen as she had a 4 hour episode of afib 10/8 lasting 4 hours 18 mins. She reports end of September she had shingles treated with steroids which may have been the trigger. No Afib noted from device clinic other than that episode.   Today, she denies symptoms of palpitations, chest pain, shortness of breath, orthopnea, PND, lower extremity edema, dizziness, presyncope, syncope, or neurologic sequela. The  patient is tolerating medications without difficulties and is otherwise without complaint today.   Past Medical History:  Diagnosis Date   Allergy    Anemia due to GI blood loss 08/12/2011   Arthritis    Atrial flutter ([place]) 12-07-2010   converted in ED with 300 mg flecainide   CAD (coronary artery disease)    a. mild per cath in 2004;  b. nonischemic Myoview in March 2012;  c. Lex MV 1/14:  EF 66%, no ischemia   Cataract    Chronic anticoagulation - Xarelto started 07/07/2015, CHADS2CVASC=5 07/07/2015   Clotting disorder ([facility])    Diastolic CHF ([place]) 9/62/8366   External hemorrhoids 06/07/2010   Gastric antral vascular ectasia    source for gi bleed in 07/2011 - Xarelto stopped   Gastritis    GERD (gastroesophageal reflux disease)    Hiatal hernia    Hyperlipidemia    Hypertension    Hypothyroidism    Neuromuscular disorder ([place])    Obesity    Paroxysmal atrial fibrillation ([place])    Personal history of colonic polyps 06/06/2009   cecal polyp   Sleep apnea    does not use CPAP   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       Ulcer    Past Surgical History:  Procedure Laterality Date   BREAST BIOPSY Left    BREAST EXCISIONAL BIOPSY Left    benign   CARDIAC CATHETERIZATION  1999&2004   CARPAL TUNNEL RELEASE Bilateral 2003   CATARACT EXTRACTION Bilateral    COLONOSCOPY     ESOPHAGOGASTRODUODENOSCOPY  08/13/2011   Procedure: ESOPHAGOGASTRODUODENOSCOPY (EGD);  Surgeon: Gatha Mayer, MD;  Location: [facility] ENDOSCOPY;  Service: Endoscopy;  Laterality: N/A;   implantable loop recorder placement  07/20/2020   Medtronic Reveal Linq model LNQ 22 ([place] [id number]) implantable loop recorder by Dr Rayann Heman   POLYPECTOMY      Current Outpatient Medications  Medication Sig Dispense Refill   acetaminophen (TYLENOL) 650 MG CR tablet Take 650 mg by mouth every 8 (eight) hours as needed for pain.     apixaban (ELIQUIS) 5 MG TABS tablet Take 1 tablet (5 mg  total) by mouth 2 (two) times daily. 42 tablet 0   Apoaequorin (PREVAGEN PO) Take 1 tablet by mouth.     blood glucose meter kit and supplies Dispense based on patient and insurance preference. Use up to four times daily as directed. (FOR ICD-10 E10.9, E11.9). 1 each 0   cetirizine (ZYRTEC) 10 MG tablet Take 10 mg by mouth as needed for allergies. Takes 10 mg as needed for sinus trouble     Cholecalciferol (VITAMIN D3) 2000 units TABS Take 2 capsules by mouth daily.     Coenzyme  Q10 (COQ10) 100 MG CAPS Take 100 mEq by mouth daily.      colchicine 0.6 MG tablet Take 1 tablet (0.6 mg total) by mouth as needed. 30 tablet 6   diclofenac Sodium (VOLTAREN) 1 % GEL Apply 2 g topically as needed (pain). 350 g 3   diltiazem (CARDIZEM CD) 360 MG 24 hr capsule TAKE 1 CAPSULE BY MOUTH  DAILY 90 capsule 3   diltiazem (CARDIZEM) 30 MG tablet Take 1 tablet (30 mg total) by mouth every 4 (four) hours as needed (elevated HR as long as BP > 100). 30 tablet 6   ferrous sulfate 325 (65 FE) MG EC tablet Take 650 mg by mouth daily.      glucose blood (ONETOUCH ULTRA) test strip 1 Container by Other route every morning.     Insulin Pen Needle 31G X 8 MM MISC Use with pen to inject once daily. 100 each 3   Lancets (ONETOUCH ULTRASOFT) lancets Use to check blood sugar daily. 100 each 3   levothyroxine (SYNTHROID) 112 MCG tablet Take 1 tablet (112 mcg total) by mouth daily before breakfast. 90 tablet 3   lidocaine (XYLOCAINE) 5 % ointment APPLY TO AFFECTED AREA  four times per day AS NEEDED 240 g 3   meclizine (ANTIVERT) 25 MG tablet TAKE 1 TABLET (25 MG TOTAL) BY MOUTH AS NEEDED. 30 tablet 3   metFORMIN (GLUCOPHAGE-XR) 500 MG 24 hr tablet TAKE 4 TABLETS BY MOUTH  DAILY 360 tablet 3   metoprolol succinate (TOPROL-XL) 100 MG 24 hr tablet TAKE 1 TABLET BY MOUTH  TWICE DAILY WITH OR  IMMEDIATELY FOLLOWING MEALS 180 tablet 3   Multiple Vitamin (MULTI-VITAMINS) TABS Take daily by mouth.     nitroGLYCERIN (NITROSTAT) 0.4 MG SL  tablet Place 1 tablet (0.4 mg total) under the tongue every 5 (five) minutes as needed for chest pain (Call 911 if chest pain after three doses). 25 tablet 3   omeprazole (PRILOSEC) 40 MG capsule TAKE 1 CAPSULE BY MOUTH  DAILY 90 capsule 3   ONETOUCH ULTRA test strip USE TO TEST BLOOD SUGAR 3  TIMES DAILY 300 strip 3   rosuvastatin (CRESTOR) 10 MG tablet TAKE 1 TABLET BY MOUTH EVERY DAY 90 tablet 3   Semaglutide, 1 MG/DOSE, 2 MG/1.5ML SOPN Inject 1 mg into the skin once a week. 9 mL 3   traMADol (ULTRAM) 50 MG tablet Take 1 tablet (50 mg total) by mouth every 6 (six) hours as needed. 20 tablet 0   triamcinolone cream (KENALOG) 0.1 % Apply 1 application topically as needed (dermatitis).     leflunomide (ARAVA) 10 MG tablet Take 10 mg by mouth daily. (Patient not taking: Reported on 12/21/2020)     pregabalin (LYRICA) 25 MG capsule Take 1 capsule (25 mg total) by mouth 2 (two) times daily. (Patient not taking: Reported on 12/21/2020) 30 capsule 0   No current facility-administered medications for this encounter.    Allergies  Allergen Reactions   Actos [Pioglitazone] Swelling    Swelling all over body and moonface   Lisinopril Swelling    Swelling, may have had some breathing involvement. Angioedema   Nsaids Other (See Comments)    Patient reports internal bleeding. Experienced with tolmetin.    Penicillins Shortness Of Breath and Swelling    Arm Swelling with Penicillin (Occurred in 1960s) Breathing - throat swelling with Amoxicillin (Occurred prior to 2002)   Sulfamethoxazole Hives and Itching    "welps all over" immediately after dose  Flecainide Other (See Comments)    Blurry  vision   Hctz [Hydrochlorothiazide] Other (See Comments)    Gout   Iodinated Diagnostic Agents Itching    Pt. Developed mild itching after receiving IV cm; pt. Held; Dr. Keane Scrape recomended she take 50 mg of benadryl when she goes home-if necessary; Dr Mickey Farber recommends benadryl prior to future exams  requiring contrast media, but stated other doctors may recommend another premedication prep.   Jardiance [Empagliflozin] Other (See Comments)    Patient reported "it made her kidneys not work"  Patient resists retrial/alternative.   Statins Other (See Comments)    Muscle aches with multiple statins. Able to tolerate rosuvastatin.   Gabapentin Other (See Comments)    Caused dysphoria    Losartan Potassium Other (See Comments)    Lower extremity swelling   Zetia [Ezetimibe] Other (See Comments)    Cramps    Social History   Socioeconomic History   Marital status: Widowed    Spouse name: Iona Beard   Number of children: 4   Years of education: some colle   Highest education level: Not on file  Occupational History   Occupation: Games developer: UNEMPLOYED  Tobacco Use   Smoking status: Former    Packs/day: 2.00    Years: 30.00    Pack years: 60.00    Types: Cigarettes    Quit date: 02/26/1993    Years since quitting: 27.8   Smokeless tobacco: Never  Vaping Use   Vaping Use: Never used  Substance and Sexual Activity   Alcohol use: No    Alcohol/week: 0.0 standard drinks   Drug use: No   Sexual activity: Not Currently  Other Topics Concern   Not on file  Social History Narrative   Not on file   Social Determinants of Health   Financial Resource Strain: Not on file  Food Insecurity: Not on file  Transportation Needs: Not on file  Physical Activity: Not on file  Stress: Not on file  Social Connections: Not on file  Intimate Partner Violence: Not on file    Family History  Problem Relation Age of Onset   Heart disease Father    Diabetes Maternal Grandfather    Hypertension Mother    Other Mother        brain tumor-benign   Diabetes Daughter        pre-diabeties   Colon polyps Daughter    Thyroid disease Daughter        x 2   Colon cancer Neg Hx    Esophageal cancer Neg Hx    Stomach cancer Neg Hx    Rectal cancer Neg Hx     ROS- All systems are  reviewed and negative except as per the HPI above  Physical Exam: Vitals:   12/21/20 1419  BP: 140/76  Pulse: 76  Weight: 81.5 kg  Height: '5\' 6"'  (1.676 m)    GEN- The patient is well appearing, alert and oriented x 3 today.   Head- normocephalic, atraumatic Eyes-  Sclera clear, conjunctiva pink Ears- hearing intact Oropharynx- clear Neck- supple, no JVP Lymph- no cervical lymphadenopathy Lungs- Clear to ausculation bilaterally, normal work of breathing Heart- Regular rate and rhythm, no murmurs, rubs or gallops, PMI not laterally displaced GI- soft, NT, ND, + BS Extremities- no clubbing, cyanosis, or edema MS- no significant deformity or atrophy Skin- no rash or lesion Psych- euthymic mood, full affect Neuro- strength and sensation are intact  EKG-NSR at 76 bpm, Pr  int 152 ms, QRS int 88 ms, qtc 438 ms   Assessment and Plan: 1. Paroxysmal AFIb Afib burden has been  low and trigger may have been shingles treated with steroids   Reviewed Dr. Jackalyn Lombard last note that stated if afib burden increased then could consider multaq/Tikosyn or repeat ablation For now pt would like to watch and wait  Continue eliquis 5 mg bid for chadsvasc score of at least 6  Continue dilt/BB at current doses For  breakthrough afib, cardizem 30 mg as needed was rx'ed  2. HTN Stable   3. DM Pt states since starting Ozempic  that she has been able to lose weight and significantly lower her A1c   afib clinic as needed  Butch Penny C. Cintia Gleed, [facility] [address] [place], [place] [address] [phone]

## 2020-12-29 ENCOUNTER — Emergency Department (HOSPITAL_BASED_OUTPATIENT_CLINIC_OR_DEPARTMENT_OTHER): Payer: Medicare Other

## 2020-12-29 ENCOUNTER — Emergency Department (HOSPITAL_BASED_OUTPATIENT_CLINIC_OR_DEPARTMENT_OTHER)
Admission: EM | Admit: 2020-12-29 | Discharge: 2020-12-29 | Disposition: A | Discharge status: Home / Self Care | Payer: Medicare Other | Attending: Emergency Medicine | Admitting: Emergency Medicine

## 2020-12-29 ENCOUNTER — Encounter (HOSPITAL_BASED_OUTPATIENT_CLINIC_OR_DEPARTMENT_OTHER): Payer: Self-pay

## 2020-12-29 ENCOUNTER — Other Ambulatory Visit: Payer: Self-pay

## 2020-12-29 DIAGNOSIS — Z87891 Personal history of nicotine dependence: Secondary | ICD-10-CM | POA: Diagnosis not present

## 2020-12-29 DIAGNOSIS — Z7901 Long term (current) use of anticoagulants: Secondary | ICD-10-CM | POA: Insufficient documentation

## 2020-12-29 DIAGNOSIS — R630 Anorexia: Secondary | ICD-10-CM | POA: Insufficient documentation

## 2020-12-29 DIAGNOSIS — I10 Essential (primary) hypertension: Secondary | ICD-10-CM | POA: Diagnosis not present

## 2020-12-29 DIAGNOSIS — E039 Hypothyroidism, unspecified: Secondary | ICD-10-CM | POA: Insufficient documentation

## 2020-12-29 DIAGNOSIS — I4892 Unspecified atrial flutter: Secondary | ICD-10-CM | POA: Diagnosis not present

## 2020-12-29 DIAGNOSIS — Z7984 Long term (current) use of oral hypoglycemic drugs: Secondary | ICD-10-CM | POA: Diagnosis not present

## 2020-12-29 DIAGNOSIS — E119 Type 2 diabetes mellitus without complications: Secondary | ICD-10-CM | POA: Diagnosis not present

## 2020-12-29 DIAGNOSIS — B0229 Other postherpetic nervous system involvement: Secondary | ICD-10-CM | POA: Insufficient documentation

## 2020-12-29 DIAGNOSIS — I2510 Atherosclerotic heart disease of native coronary artery without angina pectoris: Secondary | ICD-10-CM | POA: Insufficient documentation

## 2020-12-29 DIAGNOSIS — R1031 Right lower quadrant pain: Secondary | ICD-10-CM | POA: Diagnosis not present

## 2020-12-29 DIAGNOSIS — Z79899 Other long term (current) drug therapy: Secondary | ICD-10-CM | POA: Insufficient documentation

## 2020-12-29 DIAGNOSIS — K5900 Constipation, unspecified: Secondary | ICD-10-CM | POA: Diagnosis not present

## 2020-12-29 DIAGNOSIS — Z794 Long term (current) use of insulin: Secondary | ICD-10-CM | POA: Diagnosis not present

## 2020-12-29 LAB — CBC WITH DIFFERENTIAL/PLATELET
Abs Immature Granulocytes: 0.02 10*3/uL (ref 0.00–0.07)
Basophils Absolute: 0.1 10*3/uL (ref 0.0–0.1)
Basophils Relative: 1 %
Eosinophils Absolute: 0.3 10*3/uL (ref 0.0–0.5)
Eosinophils Relative: 5 %
HCT: 35.7 % — ABNORMAL LOW (ref 36.0–46.0)
Hemoglobin: 11.3 g/dL — ABNORMAL LOW (ref 12.0–15.0)
Immature Granulocytes: 0 %
Lymphocytes Relative: 20 %
Lymphs Abs: 1.3 10*3/uL (ref 0.7–4.0)
MCH: 28.4 pg (ref 26.0–34.0)
MCHC: 31.7 g/dL (ref 30.0–36.0)
MCV: 89.7 fL (ref 80.0–100.0)
Monocytes Absolute: 0.5 10*3/uL (ref 0.1–1.0)
Monocytes Relative: 8 %
Neutro Abs: 4.6 10*3/uL (ref 1.7–7.7)
Neutrophils Relative %: 66 %
Platelets: 215 10*3/uL (ref 150–400)
RBC: 3.98 MIL/uL (ref 3.87–5.11)
RDW: 16.8 % — ABNORMAL HIGH (ref 11.5–15.5)
WBC: 6.8 10*3/uL (ref 4.0–10.5)
nRBC: 0 % (ref 0.0–0.2)

## 2020-12-29 LAB — BASIC METABOLIC PANEL
Anion gap: 8 (ref 5–15)
BUN: 15 mg/dL (ref 8–23)
CO2: 28 mmol/L (ref 22–32)
Calcium: 9.5 mg/dL (ref 8.9–10.3)
Chloride: 102 mmol/L (ref 98–111)
Creatinine, Ser: 1.36 mg/dL — ABNORMAL HIGH (ref 0.44–1.00)
GFR, Estimated: 39 mL/min — ABNORMAL LOW (ref >60)
Glucose, Bld: 148 mg/dL — ABNORMAL HIGH (ref 70–99)
Potassium: 4.1 mmol/L (ref 3.5–5.1)
Sodium: 138 mmol/L (ref 135–145)

## 2020-12-29 MED ORDER — ONDANSETRON HCL 4 MG/2ML IJ SOLN
4.0000 mg | Freq: Once | INTRAMUSCULAR | Status: AC
  Administered 2020-12-29: 4 mg via INTRAVENOUS
  Filled 2020-12-29: qty 2

## 2020-12-29 MED ORDER — FENTANYL CITRATE PF 50 MCG/ML IJ SOSY
50.0000 ug | PREFILLED_SYRINGE | Freq: Once | INTRAMUSCULAR | Status: AC
  Administered 2020-12-29: 50 ug via INTRAVENOUS
  Filled 2020-12-29: qty 1

## 2020-12-29 NOTE — ED Triage Notes (Signed)
Pt presents with Right side abd pain and knot in her abd x1 month, gotten progressively worse over the last 2 days ago. Pt recently dx and treated for shingles.

## 2020-12-29 NOTE — ED Provider Notes (Signed)
[facility] Provider Note: Georgena Spurling, MD, FACEP  CSN: [id number] MRN: [id number] ARRIVAL: 12/29/20 at [place]: [place]  Abdominal Pain   HISTORY OF PRESENT ILLNESS  12/29/20 4:47 AM Courtney Pena is a 80 y.o. female who was treated for right flank shingles on 11/06/2020.  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  She was treated with an antiviral and gabapentin.  She did not take any narcotic pain medication.  The shingles rash has resolved leaving hyperpigmentation in its wake.  Since before her shingles episode she has had the sensation of a knot or mass in her right lower quadrant.  She describes it as feeling like a basketball in her right lower quadrant.  She feels at times like it is going to burst.  She states this has become more painful over the past 2 days.  She rates the pain as a 10 out of 10, burning in nature.  It is worse with moving and palpation.  She sleeps with her hands supporting her abdomen to help relieve the pain.  She also gets some relief with an ice pack.  She has not been vomiting or having diarrhea but has had constipation.  She has had a persistent loss of appetite and weight since the onset of the shingles.   Past Medical History:  Diagnosis Date   Allergy    Anemia due to GI blood loss 08/12/2011   Arthritis    Atrial flutter ([facility]) 12-07-2010   converted in ED with 300 mg flecainide   CAD (coronary artery disease)    a. mild per cath in 2004;  b. nonischemic Myoview in March 2012;  c. Lex MV 1/14:  EF 66%, no ischemia   Cataract    Chronic anticoagulation - Xarelto started 07/07/2015, CHADS2CVASC=5 07/07/2015   Clotting disorder (HCC)    Diastolic CHF ([place]) 0/01/2240   External hemorrhoids 06/07/2010   Gastric antral vascular ectasia    source for gi bleed in 07/2011 - Xarelto stopped   Gastritis    GERD (gastroesophageal reflux  disease)    Hiatal hernia    Hyperlipidemia    Hypertension    Hypothyroidism    Neuromuscular disorder ([place])    Obesity    Paroxysmal atrial fibrillation ([place])    Personal history of colonic polyps 06/06/2009   cecal polyp   Sleep apnea    does not use CPAP   Type 2 diabetes mellitus with diabetic chronic kidney disease ([place]) 04/25/2006       Ulcer     Past Surgical History:  Procedure Laterality Date   BREAST BIOPSY Left    BREAST EXCISIONAL BIOPSY Left    benign   CARDIAC CATHETERIZATION  1999&2004   CARPAL TUNNEL RELEASE Bilateral 2003   CATARACT EXTRACTION Bilateral    COLONOSCOPY     ESOPHAGOGASTRODUODENOSCOPY  08/13/2011   Procedure: ESOPHAGOGASTRODUODENOSCOPY (EGD);  Surgeon: Gatha Mayer, MD;  [facility]: [facility] ENDOSCOPY;  Service: Endoscopy;  Laterality: N/A;   implantable loop recorder placement  07/20/2020   Medtronic Reveal Linq model LNQ 22 ([place] [id number]) implantable loop recorder by Dr Rayann Heman   POLYPECTOMY      Family History  Problem Relation Age of Onset   Heart disease Father    Diabetes Maternal Grandfather    Hypertension Mother    Other Mother        brain tumor-benign   Diabetes Daughter  pre-diabeties   Colon polyps Daughter    Thyroid disease Daughter        x 2   Colon cancer Neg Hx    Esophageal cancer Neg Hx    Stomach cancer Neg Hx    Rectal cancer Neg Hx     Social History   Tobacco Use   Smoking status: Former    Packs/day: 2.00    Years: 30.00    Pack years: 60.00    Types: Cigarettes    Quit date: 02/26/1993    Years since quitting: 27.8   Smokeless tobacco: Never  Vaping Use   Vaping Use: Never used  Substance Use Topics   Alcohol use: No    Alcohol/week: 0.0 standard drinks   Drug use: No    Prior to Admission medications   Medication Sig Start Date End Date Taking? Authorizing Provider  acetaminophen (TYLENOL) 650 MG CR tablet Take 650 mg by mouth every 8 (eight) hours as needed for pain.    [provider]  apixaban (ELIQUIS) 5 MG TABS tablet Take 1 tablet (5 mg total) by mouth 2 (two) times daily. 03/21/20   [name], MD  Apoaequorin (PREVAGEN PO) Take 1 tablet by mouth.    [provider]  blood glucose meter kit and supplies Dispense based on patient and insurance preference. Use up to four times daily as directed. (FOR ICD-10 E10.9, E11.9). 09/14/19   [name], MD  cetirizine (ZYRTEC) 10 MG tablet Take 10 mg by mouth as needed for allergies. Takes 10 mg as needed for sinus trouble    [provider]  Cholecalciferol (VITAMIN D3) 2000 units TABS Take 2 capsules by mouth daily.    [provider]  Coenzyme Q10 (COQ10) 100 MG CAPS Take 100 mEq by mouth daily.  06/28/05   [provider]  colchicine 0.6 MG tablet Take 1 tablet (0.6 mg total) by mouth as needed. 12/05/20   [name], MD  diclofenac Sodium (VOLTAREN) 1 % GEL Apply 2 g topically as needed (pain). 12/05/20   [name], MD  diltiazem (CARDIZEM CD) 360 MG 24 hr capsule TAKE 1 CAPSULE BY MOUTH  DAILY 04/26/20   Hensel, Jamal Collin, MD  diltiazem (CARDIZEM) 30 MG tablet Take 1 tablet (30 mg total) by mouth every 4 (four) hours as needed (elevated HR as long as BP > 100). 12/30/19   Sherran Needs, NP  ferrous sulfate 325 (65 FE) MG EC tablet Take 650 mg by mouth daily.     [provider]  glucose blood (ONETOUCH ULTRA) test strip 1 Container by Other route every morning. 05/21/16   [provider]  Insulin Pen Needle 31G X 8 MM MISC Use with pen to inject once daily. 05/13/18   [name], MD  Lancets [facility] ULTRASOFT) lancets Use to check blood sugar daily. 02/13/17   [name], MD  levothyroxine (SYNTHROID) 112 MCG tablet Take 1 tablet (112 mcg total) by mouth daily before breakfast. 11/17/20   [name], MD  lidocaine (XYLOCAINE) 5 % ointment APPLY TO AFFECTED AREA  four times per day AS NEEDED 12/15/20   [name], MD  meclizine (ANTIVERT) 25 MG tablet TAKE 1 TABLET (25 MG TOTAL) BY MOUTH AS NEEDED. 09/29/20   [name], MD  metFORMIN (GLUCOPHAGE-XR) 500 MG 24 hr tablet TAKE 4 TABLETS BY MOUTH  DAILY 04/26/20   [name], MD  metoprolol succinate (  TOPROL-XL) 100 MG 24 hr tablet TAKE 1 TABLET BY MOUTH  TWICE DAILY WITH OR  IMMEDIATELY FOLLOWING MEALS 11/21/20   Hensel, Jamal Collin, MD  Multiple Vitamin (MULTI-VITAMINS) TABS Take daily by mouth. 06/28/05   [provider]  nitroGLYCERIN (NITROSTAT) 0.4 MG SL tablet Place 1 tablet (0.4 mg total) under the tongue every 5 (five) minutes as needed for chest pain (Call 911 if chest pain after three doses). 09/14/19   [name], MD  omeprazole (PRILOSEC) 40 MG capsule TAKE 1 CAPSULE BY MOUTH  DAILY 02/10/20   [name], MD  [facility] ULTRA test strip USE TO TEST BLOOD SUGAR 3  TIMES DAILY 11/21/20   [name], MD  rosuvastatin (CRESTOR) 10 MG tablet TAKE 1 TABLET BY MOUTH EVERY DAY 01/18/20   [name], MD  Semaglutide, 1 MG/DOSE, 2 MG/1.5ML SOPN Inject 1 mg into the skin once a week. 03/21/20   [name], MD  traMADol (ULTRAM) 50 MG tablet Take 1 tablet (50 mg total) by mouth every 6 (six) hours as needed. 11/06/20 11/06/21  Nena Polio, MD  triamcinolone cream (KENALOG) 0.1 % Apply 1 application topically as needed (dermatitis).    [provider]    Allergies Actos [pioglitazone], Lisinopril, Nsaids, Penicillins, Sulfamethoxazole, Flecainide, Hctz [hydrochlorothiazide], Iodinated diagnostic agents, Jardiance [empagliflozin], Statins, Gabapentin, Losartan potassium, and Zetia [ezetimibe]   REVIEW OF SYSTEMS  Negative except as noted here or in the History of Present Illness.   PHYSICAL EXAMINATION  Initial Vital Signs Blood pressure (!) 152/81, pulse 62, temperature 97.8 F (36.6 C), resp. rate 17, height _0  (1.676 m), weight 81.5 kg, SpO2 100 %.  Examination General: Well-developed,  well-nourished female in no acute distress; appearance consistent with age of record HENT: normocephalic; atraumatic Eyes: Normal appearance Neck: supple Heart: regular rate and rhythm Lungs: clear to auscultation bilaterally Abdomen: soft; nondistended; right lower quadrant fullness with tenderness; bowel sounds present Extremities: No deformity; full range of motion; pulses normal Neurologic: Awake, alert and oriented; motor function intact in all extremities and symmetric; no facial droop Skin: Warm and dry; hyperpigmentation of right flank at site of prior shingles Psychiatric: Normal mood and affect   RESULTS  Summary of this visit's results, reviewed and interpreted by myself:   EKG Interpretation  Date/Time:    Ventricular Rate:    PR Interval:    QRS Duration:   QT Interval:    QTC Calculation:   R Axis:     Text Interpretation:         Laboratory Studies: Results for orders placed or performed during the hospital encounter of 12/29/20 (from the past 24 hour(s))  CBC with Differential/Platelet     Status: Abnormal   Collection Time: 12/29/20  5:10 AM  Result Value Ref Range   WBC 6.8 4.0 - 10.5 K/uL   RBC 3.98 3.87 - 5.11 MIL/uL   Hemoglobin 11.3 (L) 12.0 - 15.0 g/dL   HCT 35.7 (L) 36.0 - 46.0 %   MCV 89.7 80.0 - 100.0 fL   MCH 28.4 26.0 - 34.0 pg   MCHC 31.7 30.0 - 36.0 g/dL   RDW 16.8 (H) 11.5 - 15.5 %   Platelets 215 150 - 400 K/uL   nRBC 0.0 0.0 - 0.2 %   Neutrophils Relative % 66 %   Neutro Abs 4.6 1.7 - 7.7 K/uL   Lymphocytes Relative 20 %   Lymphs Abs 1.3 0.7 - 4.0 K/uL   Monocytes Relative  8 %   Monocytes Absolute 0.5 0.1 - 1.0 K/uL   Eosinophils Relative 5 %   Eosinophils Absolute 0.3 0.0 - 0.5 K/uL   Basophils Relative 1 %   Basophils Absolute 0.1 0.0 - 0.1 K/uL   Immature Granulocytes 0 %   Abs Immature Granulocytes 0.02 0.00 - 0.07 K/uL  Basic metabolic panel     Status: Abnormal   Collection Time: 12/29/20  5:10 AM  Result Value Ref  Range   Sodium 138 135 - 145 mmol/L   Potassium 4.1 3.5 - 5.1 mmol/L   Chloride 102 98 - 111 mmol/L   CO2 28 22 - 32 mmol/L   Glucose, Bld 148 (H) 70 - 99 mg/dL   BUN 15 8 - 23 mg/dL   Creatinine, Ser 1.36 (H) 0.44 - 1.00 mg/dL   Calcium 9.5 8.9 - 10.3 mg/dL   GFR, Estimated 39 (L) >60 mL/min   Anion gap 8 5 - 15   Imaging Studies: CT ABDOMEN PELVIS WO CONTRAST  Result Date: 12/29/2020 CLINICAL DATA:  80 year old female with right lower quadrant abdominal pain. EXAM: CT ABDOMEN AND PELVIS WITHOUT CONTRAST TECHNIQUE: Multidetector CT imaging of the abdomen and pelvis was performed following the standard protocol without IV contrast. COMPARISON:  CT Abdomen and Pelvis 11/06/2020 and earlier. FINDINGS: Lower chest: Stable cardiomegaly. No pericardial effusion. Mildly lower lung volumes. Minor lung base atelectasis and scarring. Hepatobiliary: Negative noncontrast liver and gallbladder. Pancreas: Negative. Spleen: Negative. Adrenals/Urinary Tract: Normal adrenal glands. Polycystic renal disease has mildly progressed since 2019, with multiple small proteinaceous or hemorrhagic cysts. But there is no hydronephrosis or pararenal inflammation. Decompressed ureters. Diminutive bladder. Pelvic phleboliths but no urinary calculus. Stomach/Bowel: Redundant sigmoid colon with diverticulosis in the proximal segment. No active inflammation. Redundant transverse colon. Normal appendix on series 2, image 51. No large bowel inflammation. Negative terminal ileum. No dilated small bowel. Decompressed stomach and duodenum. No free air, free fluid, mesenteric inflammation. Vascular/Lymphatic: Aortoiliac calcified atherosclerosis. Normal caliber abdominal aorta. No lymphadenopathy. Reproductive: Stable since 2019, a small fundal fibroid has calcified since that time. Other: No pelvic free fluid. Musculoskeletal: Advanced degeneration in the spine. Pronounced degenerative sclerosis of the mid lumbar vertebrae. Superimposed  lower thoracic spina bifida occulta (normal variant). No acute osseous abnormality identified. IMPRESSION: 1. Normal appendix. No acute or inflammatory process identified in the non-contrast abdomen or pelvis. 2. Polycystic renal disease with mild progression since 2019. Sigmoid diverticulosis. Aortic Atherosclerosis (ICD10-I70.0). Cardiomegaly. Electronically Signed   By: Genevie Ann M.D.   On: 12/29/2020 05:34    ED COURSE and MDM  Nursing notes, initial and subsequent vitals signs, including pulse oximetry, reviewed and interpreted by myself.  Vitals:   12/29/20 0415 12/29/20 0417 12/29/20 0500 12/29/20 0535  BP: (!) 152/81  (!) 159/77 (!) 148/81  Pulse: 62  65 67  Resp: _0 Temp:      SpO2: 100%  96% 98%  Weight:  81.5 kg    Height:  5' 6" (1.676 m)     Medications  ondansetron (ZOFRAN) injection 4 mg (4 mg Intravenous Given 12/29/20 0531)  fentaNYL (SUBLIMAZE) injection 50 mcg (50 mcg Intravenous Given 12/29/20 0532)   The patient CT scan is unremarkable.  I suspect she is having muscle spasms or myalgias related to her shingles.  Muscle involvement and shingles is a known phenomenon.  She has not tolerated gabapentin nor does she tolerate narcotic pain medication.  It is unclear if any pharmacological intervention would be  beneficial.  She was advised to continue ice as needed and she will follow-up with her primary care physician.   PROCEDURES  Procedures   ED DIAGNOSES     ICD-10-CM   1. Post herpetic neuralgia  B02.29          Milaina Sher, Jenny Reichmann, MD 12/29/20 [phone]

## 2020-12-30 ENCOUNTER — Other Ambulatory Visit: Payer: Self-pay | Admitting: *Deleted

## 2020-12-30 DIAGNOSIS — E039 Hypothyroidism, unspecified: Secondary | ICD-10-CM

## 2020-12-30 MED ORDER — LEVOTHYROXINE SODIUM 112 MCG PO TABS
112.0000 ug | ORAL_TABLET | Freq: Every day | ORAL | 0 refills | Status: DC
Start: 2020-12-30 — End: 2021-03-27

## 2021-01-05 LAB — CUP PACEART REMOTE DEVICE CHECK
Date Time Interrogation Session: 20221107094648
Implantable Pulse Generator Implant Date: 20220525

## 2021-01-09 ENCOUNTER — Encounter: Payer: Self-pay | Admitting: Family Medicine

## 2021-01-09 ENCOUNTER — Ambulatory Visit (INDEPENDENT_AMBULATORY_CARE_PROVIDER_SITE_OTHER): Payer: Medicare Other

## 2021-01-09 ENCOUNTER — Other Ambulatory Visit: Payer: Self-pay

## 2021-01-09 ENCOUNTER — Ambulatory Visit: Payer: Medicare Other | Admitting: Family Medicine

## 2021-01-09 ENCOUNTER — Ambulatory Visit (INDEPENDENT_AMBULATORY_CARE_PROVIDER_SITE_OTHER): Payer: Medicare Other | Admitting: Family Medicine

## 2021-01-09 DIAGNOSIS — I480 Paroxysmal atrial fibrillation: Secondary | ICD-10-CM | POA: Diagnosis not present

## 2021-01-09 DIAGNOSIS — R1011 Right upper quadrant pain: Secondary | ICD-10-CM

## 2021-01-09 DIAGNOSIS — R6881 Early satiety: Secondary | ICD-10-CM | POA: Diagnosis not present

## 2021-01-09 DIAGNOSIS — B0229 Other postherpetic nervous system involvement: Secondary | ICD-10-CM

## 2021-01-09 MED ORDER — DULOXETINE HCL 30 MG PO CPEP
30.0000 mg | ORAL_CAPSULE | Freq: Every day | ORAL | 3 refills | Status: DC
Start: 2021-01-09 — End: 2021-01-12

## 2021-01-09 NOTE — Patient Instructions (Addendum)
Your symptoms are a bit puzzling.  It may be the shingles only, but I am worried enough that I want to get a second opinion from GI.  I put in a referral to the same doc you saw in 2020.  Someone should call with that appointment.   I am starting a medicine to treat the shingles pain.

## 2021-01-09 NOTE — Assessment & Plan Note (Signed)
I am concerned that the RUQ pain is maybe getting worse and that she is losing weight.  Negative CT scan is reassuring.  I will refer back to GI

## 2021-01-10 ENCOUNTER — Encounter: Payer: Self-pay | Admitting: Family Medicine

## 2021-01-10 NOTE — Assessment & Plan Note (Signed)
Added duloxetine for neuropathic pain relief.

## 2021-01-10 NOTE — Assessment & Plan Note (Signed)
Clearly has an element of post herpetic neuralgia.  I am worried about her weight loss - but most of it occurred before the dx of shingles.  I am also concerned by worsening pain.  Likely this is all shingles, but will refer back to GI for a second opinion.

## 2021-01-10 NOTE — Progress Notes (Signed)
    SUBJECTIVE:   CHIEF COMPLAINT / HPI:   FU RUQ abd pain.  ER visit and recent dx of shingles. Patient had well documented case of herpes zoster affecting the right upper abd. Dxed 9/28=almost two months ago.  Rash has long healed.  Describes continued pain and more.  She feels that there is a swelling or fullness in the RUQ.  Her appetite is poor. She doesn't want to eat much because eating may trigger her pain.  She is on semaglutide and has been since Jan.  She is aware that it may affect appetite, but the timing does not seem right.  She has the clear sense that her appetite problems began with the shingles.   Note: she has a documented 25 lb total weight loss this year: three pounds since the onset of shingles.   Seen in the ER and CT abd/pelvis performed, which is reassuring and did not find any problems that might cause her pain.  She feels the pain may be getting worse.  Also reviewd labs from ER visit of 11/3 which are also reassuring.    OBJECTIVE:   BP (!) 153/87   Pulse 69   Ht 5\' 6"  (1.676 m)   Wt 177 lb 9.6 oz (80.6 kg)   SpO2 99%   BMI 28.67 kg/m   Lungs clear Cardiac RRR without m or g Abd RUQ tenderness.  Maybe some subtle fullness in RUQ.    ASSESSMENT/PLAN:   Early satiety I am concerned that the RUQ pain is maybe getting worse and that she is losing weight.  Negative CT scan is reassuring.  I will refer back to GI  RUQ pain Clearly has an element of post herpetic neuralgia.  I am worried about her weight loss - but most of it occurred before the dx of shingles.  I am also concerned by worsening pain.  Likely this is all shingles, but will refer back to GI for a second opinion.    Post herpetic neuralgia Added duloxetine for neuropathic pain relief.      [name], MD [place]

## 2021-01-12 ENCOUNTER — Other Ambulatory Visit: Payer: Self-pay | Admitting: Family Medicine

## 2021-01-12 DIAGNOSIS — B0229 Other postherpetic nervous system involvement: Secondary | ICD-10-CM

## 2021-01-16 NOTE — Progress Notes (Signed)
[facility] Summary Report / Loop Recorder 

## 2021-01-25 ENCOUNTER — Other Ambulatory Visit: Payer: Self-pay | Admitting: Family Medicine

## 2021-01-25 DIAGNOSIS — Z1231 Encounter for screening mammogram for malignant neoplasm of breast: Secondary | ICD-10-CM

## 2021-01-31 ENCOUNTER — Encounter: Payer: Self-pay | Admitting: Gastroenterology

## 2021-01-31 ENCOUNTER — Ambulatory Visit: Payer: Medicare Other | Admitting: Gastroenterology

## 2021-01-31 VITALS — BP 150/82 | Ht 66.0 in | Wt 177.4 lb

## 2021-01-31 DIAGNOSIS — R1011 Right upper quadrant pain: Secondary | ICD-10-CM

## 2021-01-31 DIAGNOSIS — M958 Other specified acquired deformities of musculoskeletal system: Secondary | ICD-10-CM | POA: Diagnosis not present

## 2021-01-31 MED ORDER — LORAZEPAM 0.5 MG PO TABS: ORAL_TABLET | ORAL | 0 refills | Status: DC

## 2021-01-31 NOTE — Patient Instructions (Signed)
You have been scheduled for an MRI at [facility] on Wednesday 02/15/21. Your appointment time is 10 am. Please arrive to admitting (at main entrance of the hospital) 15 minutes prior to your appointment time for registration purposes. Please make certain not to have anything to eat or drink 6 hours prior to your test. In addition, if you have any metal in your body, have a pacemaker or defibrillator, please be sure to let your ordering physician know. This test typically takes 45 minutes to 1 hour to complete. Should you need to reschedule, please call [phone] to do so.  If you are age 80 or older, your body mass index should be between 23-30. Your Body mass index is 28.63 kg/m. If this is out of the aforementioned range listed, please consider follow up with your Primary Care Provider.  If you are age 67 or younger, your body mass index should be between 19-25. Your Body mass index is 28.63 kg/m. If this is out of the aformentioned range listed, please consider follow up with your Primary Care Provider.   ________________________________________________________  The [facility] providers would like to encourage you to use [facility] to communicate with providers for non-urgent requests or questions.  Due to long hold times on the telephone, sending your provider a message by [facility] may be a faster and more efficient way to get a response.  Please allow 48 business hours for a response.  Please remember that this is for non-urgent requests.  _______________________________________________________

## 2021-01-31 NOTE — Progress Notes (Signed)
01/31/2021 CALLY NYGARD [id number] 1940-10-21   HISTORY OF PRESENT ILLNESS: This is an 80 year old female who is a patient of Dr. Woodward Ku.  She is being sent here by her PCP, Dr. Andria Frames, on this occasion for early satiety is what is indicated in the referral.  The patient states that she is having pain in any bulging appearance in her right upper quadrant.  She says that she noticed the bulge prior to the onset of pain.  She says that back in August she had shingles in that area and that is when the pain began.  She tried gabapentin for the nerve pain, but says that she could not tolerate it.  Looks like her PCP sent her a prescription for Cymbalta, but she has not yet picked that up.  She says that side is very painful and she cannot sleep on it, etc.  She is convinced that there is something on that side that is causing the bulging sensation and pressure sensation in that area.  She has had 2 CT scans of the abdomen and pelvis and an ultrasound since September.  The only notable findings are polycystic kidney disease.  She reports that she had some nausea, but that has gotten better.  She says that her appetite has not been great.  It looks like her weight is down about 22 pounds over the past several months.    She had both EGD and colonoscopy in January 2020 for evaluation of iron deficiency anemia.  EGD was normal.  Colonoscopy showed a few polyps that were removed, diverticulosis, and hemorrhoids.   Past Medical History:  Diagnosis Date   Allergy    Anemia due to GI blood loss 08/12/2011   Arthritis    Atrial flutter ([place]) 12-07-2010   converted in ED with 300 mg flecainide   CAD (coronary artery disease)    a. mild per cath in 2004;  b. nonischemic Myoview in March 2012;  c. Lex MV 1/14:  EF 66%, no ischemia   Cataract    Chronic anticoagulation - Xarelto started 07/07/2015, CHADS2CVASC=5 07/07/2015   Clotting disorder (HCC)    Diastolic CHF ([place]) 04/11/863    External hemorrhoids 06/07/2010   Gastric antral vascular ectasia    source for gi bleed in 07/2011 - Xarelto stopped   Gastritis    GERD (gastroesophageal reflux disease)    Hiatal hernia    Hyperlipidemia    Hypertension    Hypothyroidism    Neuromuscular disorder ([place])    Obesity    Paroxysmal atrial fibrillation ([place])    Personal history of colonic polyps 06/06/2009   cecal polyp   Sleep apnea    does not use CPAP   Type 2 diabetes mellitus with diabetic chronic kidney disease ([place]) 04/25/2006       Ulcer    Past Surgical History:  Procedure Laterality Date   BREAST BIOPSY Left    BREAST EXCISIONAL BIOPSY Left    benign   CARDIAC CATHETERIZATION  1999&2004   CARPAL TUNNEL RELEASE Bilateral 2003   CATARACT EXTRACTION Bilateral    COLONOSCOPY     ESOPHAGOGASTRODUODENOSCOPY  08/13/2011   Procedure: ESOPHAGOGASTRODUODENOSCOPY (EGD);  Surgeon: Gatha Mayer, MD;  Location: [facility] ENDOSCOPY;  Service: Endoscopy;  Laterality: N/A;   implantable loop recorder placement  07/20/2020   Medtronic Reveal Linq model LNQ 22 ([place] [id number]) implantable loop recorder by Dr Rayann Heman   POLYPECTOMY      reports that she quit smoking about 27  years ago. Her smoking use included cigarettes. She has a 60.00 pack-year smoking history. She has never used smokeless tobacco. She reports that she does not drink alcohol and does not use drugs. family history includes Colon polyps in her daughter; Diabetes in her daughter and maternal grandfather; Heart disease in her father; Hypertension in her mother; Other in her mother; Thyroid disease in her daughter. Allergies  Allergen Reactions   Actos [Pioglitazone] Swelling    Swelling all over body and moonface   Lisinopril Swelling    Swelling, may have had some breathing involvement. Angioedema   Nsaids Other (See Comments)    Patient reports internal bleeding. Experienced with tolmetin.    Penicillins Shortness Of Breath and Swelling    Arm Swelling with  Penicillin (Occurred in 1960s) Breathing - throat swelling with Amoxicillin (Occurred prior to 2002)   Sulfamethoxazole Hives and Itching    "welps all over" immediately after dose   Flecainide Other (See Comments)    Blurry  vision   Hctz [Hydrochlorothiazide] Other (See Comments)    Gout   Iodinated Diagnostic Agents Itching    Pt. Developed mild itching after receiving IV cm; pt. Held; Dr. Keane Scrape recomended she take 50 mg of benadryl when she goes home-if necessary; Dr Mickey Farber recommends benadryl prior to future exams requiring contrast media, but stated other doctors may recommend another premedication prep.   Jardiance [Empagliflozin] Other (See Comments)    Patient reported "it made her kidneys not work"  Patient resists retrial/alternative.   Statins Other (See Comments)    Muscle aches with multiple statins. Able to tolerate rosuvastatin.   Gabapentin Other (See Comments)    Caused dysphoria    Losartan Potassium Other (See Comments)    Lower extremity swelling   Zetia [Ezetimibe] Other (See Comments)    Cramps      Outpatient Encounter Medications as of 01/31/2021  Medication Sig   acetaminophen (TYLENOL) 650 MG CR tablet Take 650 mg by mouth every 8 (eight) hours as needed for pain.   apixaban (ELIQUIS) 5 MG TABS tablet Take 1 tablet (5 mg total) by mouth 2 (two) times daily.   Apoaequorin (PREVAGEN PO) Take 1 tablet by mouth.   blood glucose meter kit and supplies Dispense based on patient and insurance preference. Use up to four times daily as directed. (FOR ICD-10 E10.9, E11.9).   cetirizine (ZYRTEC) 10 MG tablet Take 10 mg by mouth as needed for allergies. Takes 10 mg as needed for sinus trouble   Cholecalciferol (VITAMIN D3) 2000 units TABS Take 2 capsules by mouth daily.   Coenzyme Q10 (COQ10) 100 MG CAPS Take 100 mEq by mouth daily.    colchicine 0.6 MG tablet Take 1 tablet (0.6 mg total) by mouth as needed.   diclofenac Sodium (VOLTAREN) 1 % GEL Apply 2 g  topically as needed (pain).   diltiazem (CARDIZEM CD) 360 MG 24 hr capsule TAKE 1 CAPSULE BY MOUTH  DAILY   diltiazem (CARDIZEM) 30 MG tablet Take 1 tablet (30 mg total) by mouth every 4 (four) hours as needed (elevated HR as long as BP > 100).   DULoxetine (CYMBALTA) 30 MG capsule TAKE 1 CAPSULE BY MOUTH EVERY DAY   ferrous sulfate 325 (65 FE) MG EC tablet Take 650 mg by mouth daily.    glucose blood (ONETOUCH ULTRA) test strip 1 Container by Other route every morning.   Insulin Pen Needle 31G X 8 MM MISC Use with pen to inject once daily.  Lancets (ONETOUCH ULTRASOFT) lancets Use to check blood sugar daily.   levothyroxine (SYNTHROID) 112 MCG tablet Take 1 tablet (112 mcg total) by mouth daily before breakfast.   lidocaine (XYLOCAINE) 5 % ointment APPLY TO AFFECTED AREA  four times per day AS NEEDED   meclizine (ANTIVERT) 25 MG tablet TAKE 1 TABLET (25 MG TOTAL) BY MOUTH AS NEEDED.   metFORMIN (GLUCOPHAGE-XR) 500 MG 24 hr tablet TAKE 4 TABLETS BY MOUTH  DAILY   metoprolol succinate (TOPROL-XL) 100 MG 24 hr tablet TAKE 1 TABLET BY MOUTH  TWICE DAILY WITH OR  IMMEDIATELY FOLLOWING MEALS   Multiple Vitamin (MULTI-VITAMINS) TABS Take daily by mouth.   nitroGLYCERIN (NITROSTAT) 0.4 MG SL tablet Place 1 tablet (0.4 mg total) under the tongue every 5 (five) minutes as needed for chest pain (Call 911 if chest pain after three doses).   omeprazole (PRILOSEC) 40 MG capsule TAKE 1 CAPSULE BY MOUTH  DAILY   ONETOUCH ULTRA test strip USE TO TEST BLOOD SUGAR 3  TIMES DAILY   rosuvastatin (CRESTOR) 10 MG tablet TAKE 1 TABLET BY MOUTH EVERY DAY   Semaglutide, 1 MG/DOSE, 2 MG/1.5ML SOPN Inject 1 mg into the skin once a week.   traMADol (ULTRAM) 50 MG tablet Take 1 tablet (50 mg total) by mouth every 6 (six) hours as needed.   triamcinolone cream (KENALOG) 0.1 % Apply 1 application topically as needed (dermatitis).   No facility-administered encounter medications on file as of 01/31/2021.    REVIEW OF  SYSTEMS  : All other systems reviewed and negative except where noted in the History of Present Illness.   PHYSICAL EXAM: BP (!) 150/82   Ht '5\' 6"'  (1.676 m)   Wt 177 lb 6.4 oz (80.5 kg)   BMI 28.63 kg/m  General: Well developed AA female in no acute distress Head: Normocephalic and atraumatic Eyes:  Sclerae anicteric, conjunctiva pink. Ears: Normal auditory acuity. Lungs: Clear throughout to auscultation; no W/R/R. Heart: Regular rate and rhythm; no M/R/G. Abdomen: Soft, non-distended.  BS present.  Superficial TTP in RUQ.  No masses or hernias felt.   Musculoskeletal: Symmetrical with no gross deformities  Skin: No lesions on visible extremities Extremities: No edema  Neurological: Alert oriented x 4, grossly non-focal Psychological:  Alert and cooperative. Normal mood and affect  ASSESSMENT AND PLAN: *80 year old female with complaints of right upper quadrant abdominal pain and a right upper quadrant abdominal bulge.  She reports that she noticed the bulge prior to the onset of the pain and she had shingles in this area back in August, which is when the pain began.  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  She could not tolerate gabapentin.  It looks like her PCP recently sent her a prescription for Cymbalta, which she has not yet picked up.  I encouraged her to do so.  I do not feel any abnormalities in that area.  There is more prominence on that side when the patient is standing and I think may be more of just an asymmetry in her fat distribution.  She is convinced that there is something there that is causing her pain.  She questioned about endoscopic evaluation, but I do not really know what that would show [name].  She has had 2 CT scans and an ultrasound since September.  The only other thing would be is that she does have polycystic kidney disease that has mildly progressed since 2019 with multiple small cysts on her kidneys.  Question that this is causing some  pressure on that side and causing it to look like it is protruding somewhat, but I think that is less likely.  We will get an MRI of the abdomen to be sure we do not see any other abnormalities.  She is asking for anxiolytic prior to MRI so we will give Ativan 0.5 mg 30 minutes prior to the study.  She does have a recording device, but her card says that it is MR conditional.  CC:  Hensel, Jamal Collin, MD

## 2021-02-06 ENCOUNTER — Other Ambulatory Visit: Payer: Self-pay

## 2021-02-06 ENCOUNTER — Ambulatory Visit: Payer: Medicare Other | Admitting: Pharmacist

## 2021-02-06 ENCOUNTER — Encounter: Payer: Self-pay | Admitting: Pharmacist

## 2021-02-06 ENCOUNTER — Encounter: Payer: Self-pay | Admitting: Family Medicine

## 2021-02-06 ENCOUNTER — Ambulatory Visit: Payer: Medicare Other | Admitting: Family Medicine

## 2021-02-06 VITALS — BP 135/74 | HR 67 | Ht 66.0 in | Wt 176.6 lb

## 2021-02-06 DIAGNOSIS — B0229 Other postherpetic nervous system involvement: Secondary | ICD-10-CM

## 2021-02-06 DIAGNOSIS — R1011 Right upper quadrant pain: Secondary | ICD-10-CM

## 2021-02-06 DIAGNOSIS — E1122 Type 2 diabetes mellitus with diabetic chronic kidney disease: Secondary | ICD-10-CM

## 2021-02-06 DIAGNOSIS — J309 Allergic rhinitis, unspecified: Secondary | ICD-10-CM | POA: Diagnosis not present

## 2021-02-06 LAB — POCT GLYCOSYLATED HEMOGLOBIN (HGB A1C): HbA1c, POC (controlled diabetic range): 6.6 % (ref 0.0–7.0)

## 2021-02-06 MED ORDER — DULOXETINE HCL 30 MG PO CPEP: ORAL_CAPSULE | ORAL | 3 refills | Status: DC

## 2021-02-06 MED ORDER — SEMAGLUTIDE (1 MG/DOSE) 2 MG/1.5ML ~~LOC~~ SOPN: 1.0000 mg | PEN_INJECTOR | SUBCUTANEOUS | Status: DC

## 2021-02-06 MED ORDER — FLUTICASONE PROPIONATE 50 MCG/ACT NA SUSP: 2.0000 | Freq: Every day | NASAL | 6 refills | Status: DC

## 2021-02-06 NOTE — Progress Notes (Signed)
Reviewed: I agree with Dr. Koval's documentation and management. 

## 2021-02-06 NOTE — Patient Instructions (Addendum)
Nice to see you today.   Sorry to hear that you are having symptoms from your Shingles.   I hope your appetite continues to improve.   Let's try to decrease your Ozempic (semaglutide) on Saturday to evaluate if it helps your right sided pain.  If blood glucose remains in good control, a trial off of Ozempic (semaglutide) should be considered next.   I am happy to see you again in January as needed to help reevaluate and adjust your diabetes medication(s).   Follow- up with Dr. Andria Frames in January.

## 2021-02-06 NOTE — Assessment & Plan Note (Signed)
Diabetes longstanding and currently doing well with glycemic control. Patient is able to verbalize appropriate hypoglycemia management plan. Medication adherence appears good. Gal bladder involvement of GLP therapy could be culprit for right sided pain. We agreed to a trial of lower dose Ozempic (semaglutide) this week, and potential trial off of Ozempic for several weeks if pain persists.  - MRI is already scheduled for 12/21 (9 days from now).  - Decreased dose of GLP-1 Ozempic  (semaglutide) from 1mg  0.5mg  weekly on Saturday.

## 2021-02-06 NOTE — Progress Notes (Signed)
    S:     Chief Complaint  Patient presents with   Medication Management    Medication Review    Patient arrives in good spirits, ambulating.  Presents for diabetes evaluation, education, and management Patient was seen by Primary Care Provider,Dr. Andria Frames this AM.   Patient reports right sided upper abdomin pain continues.  She reports an MRI is scheduled 12/21.   Medication adherence reported Metformin and semaglutide 1mg  weekly (on Saturdays) .    Patient denies hypoglycemic events.  Patient reported dietary habits: appetite has improved recently (was not hungry with shingles flare)  Patient-reported exercise habits: limited due to pain    O:  Physical Exam Pulmonary:     Effort: Pulmonary effort is normal.  Neurological:     Mental Status: She is alert.  Psychiatric:        Mood and Affect: Mood normal.    Review of Systems  Gastrointestinal:  Positive for abdominal pain.   Lab Results  Component Value Date   HGBA1C 6.6 02/06/2021   There were no vitals filed for this visit.  Lipid Panel     Component Value Date/Time   CHOL 144 07/14/2020 1121   TRIG 77 07/14/2020 1121   HDL 60 07/14/2020 1121   CHOLHDL 2.4 07/14/2020 1121   CHOLHDL 4.7 01/27/2015 1238   VLDL 28 01/27/2015 1238   LDLCALC 69 07/14/2020 1121   LDLDIRECT 62 11/24/2018 1211   LDLDIRECT 178 (H) 05/12/2015 1229    Home fasting blood sugars: low 100s, denies any  hypoglycemia symptoms.    A/P: Diabetes longstanding and currently doing well with glycemic control. Patient is able to verbalize appropriate hypoglycemia management plan. Medication adherence appears good. Gal bladder involvement of GLP therapy could be culprit for right sided pain. We agreed to a trial of lower dose Ozempic (semaglutide) this week, and potential trial off of Ozempic for several weeks if pain persists.  - MRI is already scheduled for 12/21 (9 days from now).  - Decreased dose of GLP-1 Ozempic  (semaglutide) from  1mg  0.5mg  weekly on Saturday.    Written patient instructions provided.  Total time in face to face counseling 16 minutes.   Follow up Pharmacist PRN.  Visit with PCP in 3-4 weeks.Marland Kitchen

## 2021-02-06 NOTE — Patient Instructions (Signed)
I think your blood pressure is fine I think the MRI is the last test you need on your belly.  If it is normal, I feel confident is saying this is all lingering shingles.   The diabetes is great. I sent in two prescriptions  Duloxetine to help with the shingles pain Flonase for the swelling, itchy eyes. See me in the new year

## 2021-02-07 ENCOUNTER — Encounter: Payer: Self-pay | Admitting: Family Medicine

## 2021-02-07 ENCOUNTER — Telehealth: Payer: Self-pay | Admitting: Gastroenterology

## 2021-02-07 NOTE — Telephone Encounter (Signed)
I spoke with the pt and she would like to see if her MRI could be moved up sooner.  I have given her the number to scheduling to call and see if the MRI can be sooner.  The pt has been advised of the information and verbalized understanding.

## 2021-02-07 NOTE — Assessment & Plan Note (Signed)
Start duloxetine as previously prescribed.

## 2021-02-07 NOTE — Assessment & Plan Note (Signed)
Flonase first.  If that does not control symptoms, will prescribe patanol eye drops.

## 2021-02-07 NOTE — Progress Notes (Signed)
° ° °  SUBJECTIVE:   CHIEF COMPLAINT / HPI:   Several issues: Right Upper Quadrent pain and swelling.  Started with shingles and now has lingering pain.  Last visit I prescribed cymbalta - Dorrine was unaware and did not pick up.  Has had some poor appetite and weight loss.  WU thus far has included CT abd and GI consult.  GI has ordered an MRI.   DM.  A1C good at 6.6.  Also seen today by Pharmacology who decreased GLP1 on the chance this is contributing to pain and weight loss. Eyes itch every morning and C/O bags under her eyes.   BP initiall elevated but normal on repeat.  Not on an ACE or ARB because of previous angioedema.      OBJECTIVE:   BP 135/74 (BP Location: Left Arm, Cuff Size: Large)    Pulse 67    Ht 5\' 6"  (1.676 m)    Wt 176 lb 9.6 oz (80.1 kg)    SpO2 100%    BMI 28.50 kg/m   Lungs clear Cardiac RRR without m or g Abd benign.  ASSESSMENT/PLAN:   Type 2 diabetes mellitus with diabetic chronic kidney disease (HCC) Good A1C.  Agree with Dr. Valentina Lucks trial of decrease GLP1 to see if helps pain or appetite.  Allergic rhinitis Flonase first.  If that does not control symptoms, will prescribe patanol eye drops.    RUQ abdominal pain I believe most all of her pain is post herpetic neuralgia.  Post herpetic neuralgia Start duloxetine as previously prescribed.       Courtney Resides, MD [place]

## 2021-02-07 NOTE — Assessment & Plan Note (Signed)
I believe most all of her pain is post herpetic neuralgia.

## 2021-02-07 NOTE — Assessment & Plan Note (Signed)
Good A1C.  Agree with Dr. Valentina Lucks trial of decrease GLP1 to see if helps pain or appetite.

## 2021-02-07 NOTE — Telephone Encounter (Signed)
Inbound call from patient, states that she is still in pain and is supposed to have her MRI next week. Wants to know if there is any way she can get MRI done sooner. Please advise.

## 2021-02-08 ENCOUNTER — Telehealth: Payer: Self-pay | Admitting: Gastroenterology

## 2021-02-08 MED ORDER — LORAZEPAM 0.5 MG PO TABS: ORAL_TABLET | ORAL | 0 refills | Status: DC

## 2021-02-08 NOTE — Telephone Encounter (Signed)
Faxed script to [facility]. Received confirmation that fax was sent.

## 2021-02-08 NOTE — Telephone Encounter (Signed)
Inbound call from patient, states that her MRI was moved up to tomorrow.  States that she contacted [facility] and they told her they did not have her medication for the MRI.  Please advise.

## 2021-02-08 NOTE — Telephone Encounter (Signed)
Patient informed script was faxed to pharmacy. She will call back with any other problems.

## 2021-02-10 ENCOUNTER — Ambulatory Visit (HOSPITAL_COMMUNITY)
Admission: RE | Admit: 2021-02-10 | Discharge: 2021-02-10 | Disposition: A | Discharge status: Home / Self Care | Payer: Medicare Other | Source: Ambulatory Visit | Attending: Gastroenterology | Admitting: Gastroenterology

## 2021-02-10 DIAGNOSIS — M958 Other specified acquired deformities of musculoskeletal system: Secondary | ICD-10-CM | POA: Diagnosis not present

## 2021-02-10 DIAGNOSIS — R1011 Right upper quadrant pain: Secondary | ICD-10-CM | POA: Insufficient documentation

## 2021-02-10 MED ORDER — GADOBUTROL 1 MMOL/ML IV SOLN
8.0000 mL | Freq: Once | INTRAVENOUS | Status: AC | PRN
  Administered 2021-02-10: 8 mL via INTRAVENOUS

## 2021-02-13 ENCOUNTER — Telehealth: Payer: Self-pay | Admitting: Gastroenterology

## 2021-02-13 ENCOUNTER — Ambulatory Visit (INDEPENDENT_AMBULATORY_CARE_PROVIDER_SITE_OTHER): Payer: Medicare Other

## 2021-02-13 DIAGNOSIS — I480 Paroxysmal atrial fibrillation: Secondary | ICD-10-CM | POA: Diagnosis not present

## 2021-02-13 LAB — CUP PACEART REMOTE DEVICE CHECK
Date Time Interrogation Session: 20221217230615
Implantable Pulse Generator Implant Date: 20220525

## 2021-02-13 NOTE — Telephone Encounter (Signed)
The pt called for MRI results.  I advised her that [name] will review when back in the office tomorrow.  Fernand Parkins

## 2021-02-13 NOTE — Telephone Encounter (Signed)
Patient called to go over MRI results

## 2021-02-15 ENCOUNTER — Ambulatory Visit (HOSPITAL_COMMUNITY): Payer: Medicare Other

## 2021-02-17 ENCOUNTER — Other Ambulatory Visit: Payer: Self-pay | Admitting: Family Medicine

## 2021-02-17 DIAGNOSIS — D500 Iron deficiency anemia secondary to blood loss (chronic): Secondary | ICD-10-CM

## 2021-02-17 DIAGNOSIS — E7800 Pure hypercholesterolemia, unspecified: Secondary | ICD-10-CM

## 2021-02-22 NOTE — Progress Notes (Signed)
Received notification from [place] regarding RE-ENROLLMENT approval for [place]. Patient assistance approved from 02/26/21 to 02/25/22.  MEDICATION WILL CONTINUE TO SHIP TO OFFICE  Phone: [phone]

## 2021-02-22 NOTE — Progress Notes (Signed)
reviewed

## 2021-02-23 NOTE — Progress Notes (Signed)
[facility] Summary Report / Loop Recorder 

## 2021-02-26 DIAGNOSIS — B029 Zoster without complications: Secondary | ICD-10-CM

## 2021-02-26 HISTORY — DX: Zoster without complications: B02.9

## 2021-02-28 NOTE — Telephone Encounter (Signed)
Patient's mother calling stating patient has not received a call.  Please advise.

## 2021-02-28 NOTE — Telephone Encounter (Signed)
Courtney Lasso, RN  02/14/2021  1:21 PM EST     I spoke with the pt and gave her the results of the MRI.  She is certain that there is something "terribly wrong" and asked to see Dr Silverio Decamp.  As I was getting the schedule pulled up she said, " so all that information that is on My Chart about the MRI means nothing?"  She then says [name] is "only a PA" then states she will call back to make an appt with Dr Silverio Decamp and hangs up. FYI Jess    Loralie Champagne, PA-C  02/14/2021 12:22 PM EST     She does have some possible small gallstones in her gallbladder.  My assumption is that they have probably been there for a long time and are likely not the cause of her pain.  I really think her pain was from her shingles.  There is no finding of any mass or anything to account for this fullness on her right side.  She also has multiple cysts in both of her kidneys, not sure if this is something that she had known about previously, but I do not see it listed in her past medical history.  Nonetheless, the kidneys are normal size so I doubt that that is causing any issue with her pain, etc.    I spoke with the pt and we discussed our conversation from 12/20.  She states she is aware that we talked but would like to see Dr Silverio Decamp to discuss.  I see that she already has an appt on 2/23 with Dr Silverio Decamp but there was a sooner appt on 2/14.  The pt accepted that appt and was changed to that date.  She will call back to see if there have been any cancellations.

## 2021-03-01 ENCOUNTER — Ambulatory Visit: Payer: Medicare Other

## 2021-03-01 NOTE — Progress Notes (Signed)
Received notification from [place] ([place]) regarding RE-ENROLLMENT approval for [employer]. Patient assistance approved until 02/25/22.  Medication will AUTO-FILL and ship to patients home. Last shipment delivered 01/09/21 (90 DAY SUPPLY).  Phone: [phone]

## 2021-03-01 NOTE — Progress Notes (Signed)
Wonderful. 

## 2021-03-07 NOTE — Progress Notes (Signed)
Reviewed and agree with documentation and assessment and plan. [name] , MD   

## 2021-03-13 ENCOUNTER — Telehealth: Payer: Self-pay | Admitting: Gastroenterology

## 2021-03-13 ENCOUNTER — Telehealth: Payer: Self-pay

## 2021-03-13 NOTE — Telephone Encounter (Signed)
Patent called states she is in a lot of abdominal pain said she feels like a lump on the side of her stomach and is seeking further help

## 2021-03-13 NOTE — Telephone Encounter (Signed)
Pt called in complaining of abdominal pain, close to a knot that developed when she had shingles back in September. She was seen by Alonza Bogus in December, and states her pain continues. It is unclear if pt is taking the Cymbalta as directed by PA at her last OV, pt just stated "she has tried everything". Currently taking tylenol for the pain. Pt is scheduled for the earliest OV available with Dr. Silverio Decamp on 04/11/21. Pt refused to be seen at an earlier time by a PA or NP. Pt was advised that if symptoms increase, she should visit urgent care/ED. Will check back at a later time for potential openings.

## 2021-03-14 IMAGING — US US BREAST*L* LIMITED INC AXILLA
1 series · 6 of 6 positions shown · non-contrast
Comparison: Prior films

CLINICAL DATA: Palpable lump left breast

EXAM:
DIGITAL DIAGNOSTIC left MAMMOGRAM WITH CAD AND TOMO
ULTRASOUND left BREAST

[Series 1: us breast*left* limited inc axilla · 0.06mm/px · 6 of 6 slices shown]
[im 1/6]
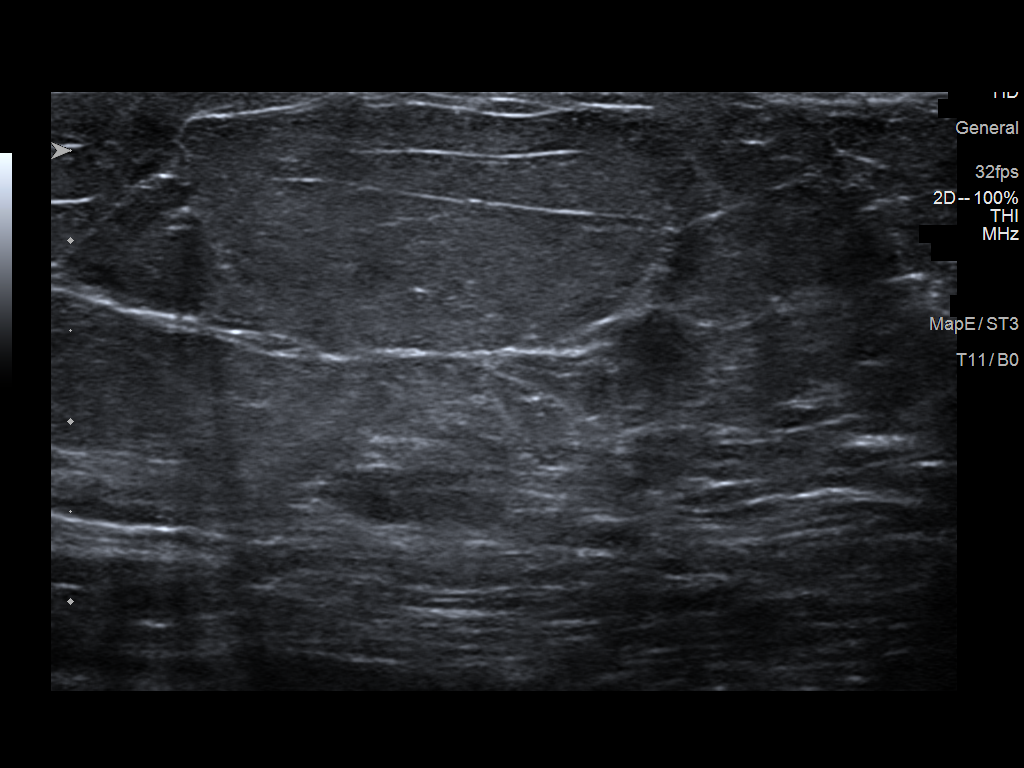
[im 2/6]
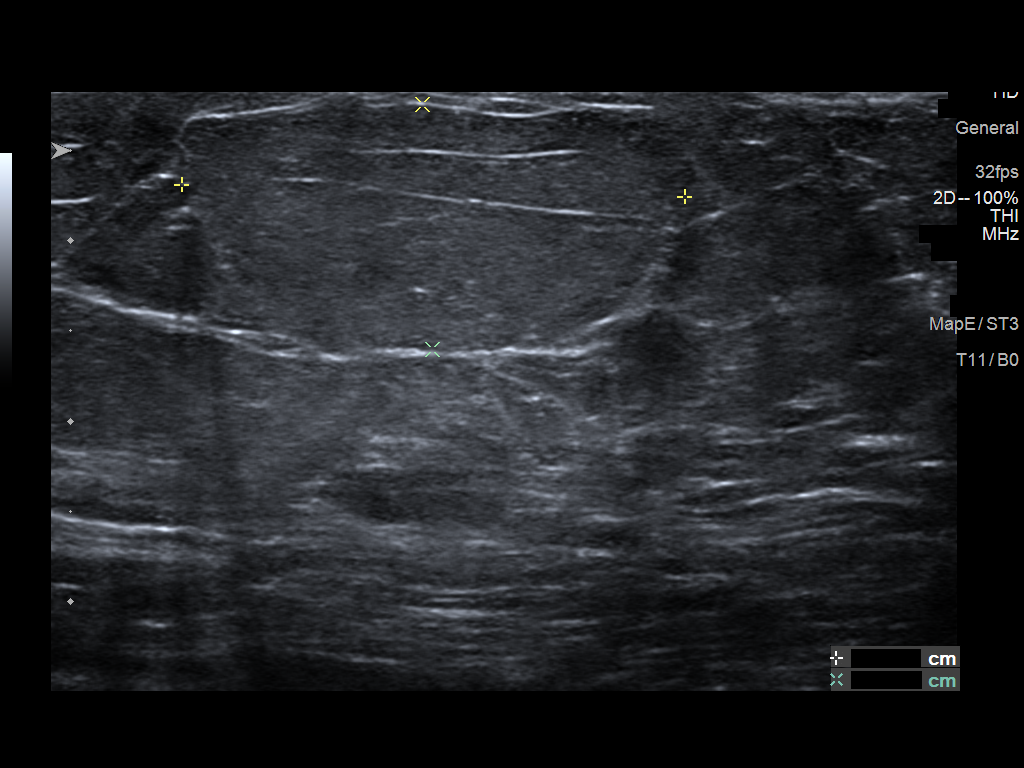
[im 3/6]
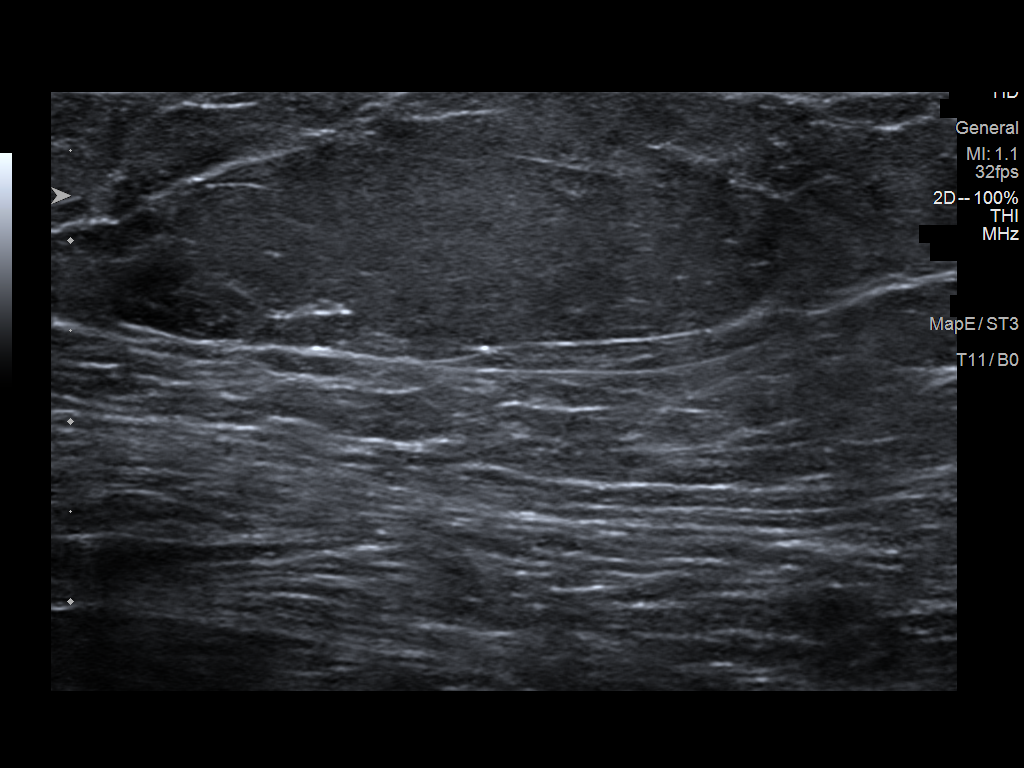
[im 4/6]
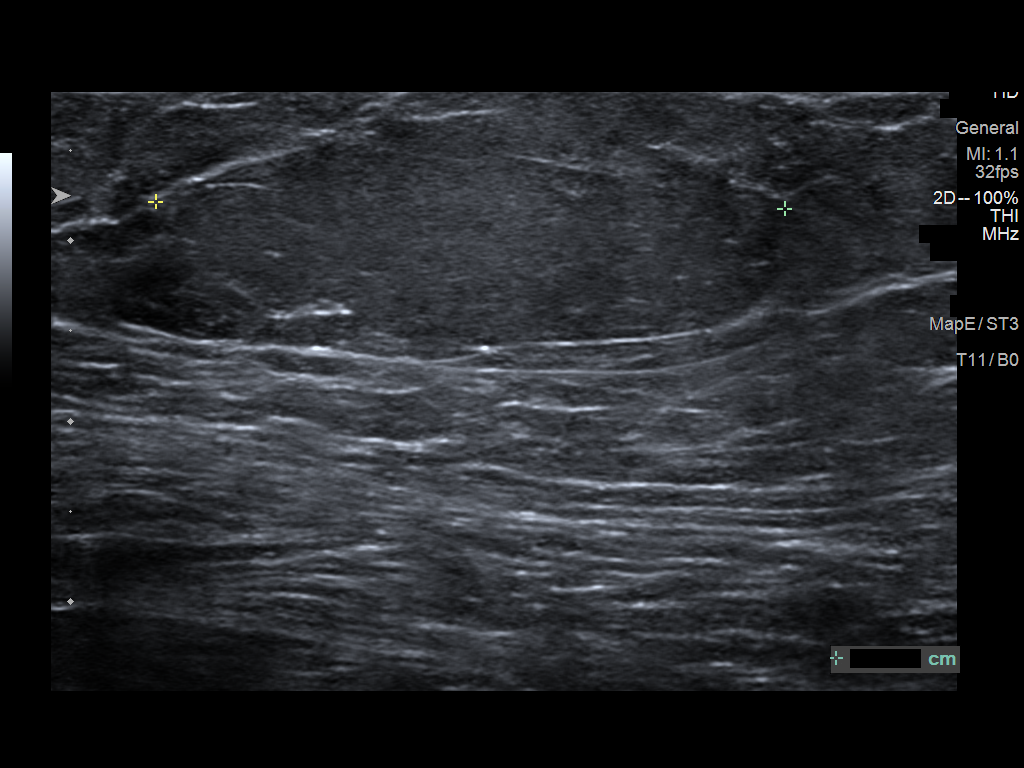
[im 5/6]
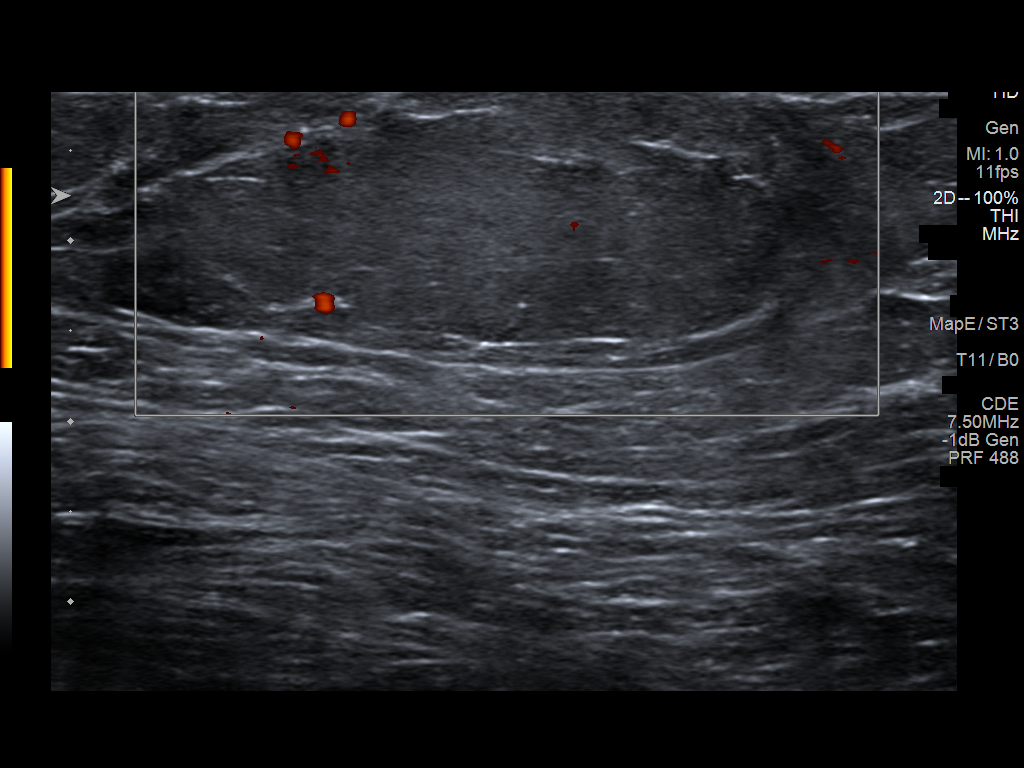
[im 6/6]
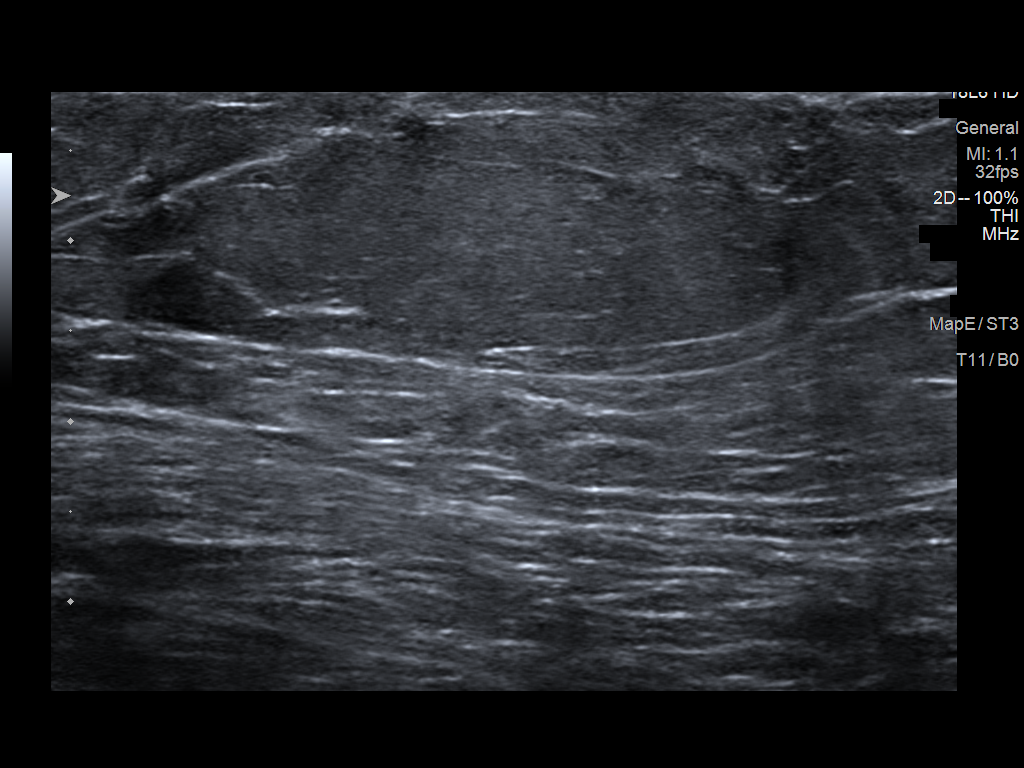

[6 of 6 positions shown; findings below may reference images not displayed]

ACR Breast Density Category b: There are scattered areas of
fibroglandular density.
FINDINGS: Cc and MLO views of the left breast, spot tangential view of left
breast are submitted. At the palpable area left breast 11 o'clock
position, there is a lipoma. No suspicious abnormality is
identified.

Mammographic images were processed with CAD.

Targeted ultrasound is performed, showing 3 x 1.4 x 3.5 cm lipoma at
the left breast 11 o'clock 12 cm from nipple palpable area
correlating to the mammographic finding.
IMPRESSION: Benign findings.

RECOMMENDATION:
Routine screening mammogram back on schedule.

I have discussed the findings and recommendations with the patient.
If applicable, a reminder letter will be sent to the patient
regarding the next appointment.

BI-RADS CATEGORY  2: Benign.

## 2021-03-20 ENCOUNTER — Ambulatory Visit (INDEPENDENT_AMBULATORY_CARE_PROVIDER_SITE_OTHER): Payer: Medicare Other

## 2021-03-20 DIAGNOSIS — I480 Paroxysmal atrial fibrillation: Secondary | ICD-10-CM | POA: Diagnosis not present

## 2021-03-20 LAB — CUP PACEART REMOTE DEVICE CHECK
Date Time Interrogation Session: 20230122230322
Implantable Pulse Generator Implant Date: 20220525

## 2021-03-20 NOTE — Telephone Encounter (Signed)
Pt rescheduled for a sooner available time on 03/21/21 at 8:30 am. Pt aware, no further questions.

## 2021-03-21 ENCOUNTER — Encounter: Payer: Self-pay | Admitting: Gastroenterology

## 2021-03-21 ENCOUNTER — Other Ambulatory Visit (INDEPENDENT_AMBULATORY_CARE_PROVIDER_SITE_OTHER): Payer: Medicare Other

## 2021-03-21 ENCOUNTER — Ambulatory Visit: Payer: Medicare Other | Admitting: Gastroenterology

## 2021-03-21 VITALS — BP 150/80 | HR 72 | Ht 64.25 in | Wt 177.0 lb

## 2021-03-21 DIAGNOSIS — Q613 Polycystic kidney, unspecified: Secondary | ICD-10-CM | POA: Diagnosis not present

## 2021-03-21 DIAGNOSIS — R109 Unspecified abdominal pain: Secondary | ICD-10-CM

## 2021-03-21 DIAGNOSIS — K8020 Calculus of gallbladder without cholecystitis without obstruction: Secondary | ICD-10-CM

## 2021-03-21 LAB — CBC WITH DIFFERENTIAL/PLATELET
Basophils Absolute: 0.1 10*3/uL (ref 0.0–0.1)
Basophils Relative: 1.2 % (ref 0.0–3.0)
Eosinophils Absolute: 0.2 10*3/uL (ref 0.0–0.7)
Eosinophils Relative: 3.6 % (ref 0.0–5.0)
HCT: 33.3 % — ABNORMAL LOW (ref 36.0–46.0)
Hemoglobin: 10.9 g/dL — ABNORMAL LOW (ref 12.0–15.0)
Lymphocytes Relative: 23.2 % (ref 12.0–46.0)
Lymphs Abs: 1.2 10*3/uL (ref 0.7–4.0)
MCHC: 32.8 g/dL (ref 30.0–36.0)
MCV: 87.6 fl (ref 78.0–100.0)
Monocytes Absolute: 0.4 10*3/uL (ref 0.1–1.0)
Monocytes Relative: 7.8 % (ref 3.0–12.0)
Neutro Abs: 3.4 10*3/uL (ref 1.4–7.7)
Neutrophils Relative %: 64.2 % (ref 43.0–77.0)
Platelets: 214 10*3/uL (ref 150.0–400.0)
RBC: 3.81 Mil/uL — ABNORMAL LOW (ref 3.87–5.11)
RDW: 15.1 % (ref 11.5–15.5)
WBC: 5.3 10*3/uL (ref 4.0–10.5)

## 2021-03-21 LAB — COMPREHENSIVE METABOLIC PANEL
ALT: 10 U/L (ref 0–35)
AST: 14 U/L (ref 0–37)
Albumin: 4.1 g/dL (ref 3.5–5.2)
Alkaline Phosphatase: 58 U/L (ref 39–117)
BUN: 15 mg/dL (ref 6–23)
CO2: 29 mEq/L (ref 19–32)
Calcium: 10.3 mg/dL (ref 8.4–10.5)
Chloride: 101 mEq/L (ref 96–112)
Creatinine, Ser: 0.93 mg/dL (ref 0.40–1.20)
GFR: 57.88 mL/min — ABNORMAL LOW (ref >60.00)
Glucose, Bld: 144 mg/dL — ABNORMAL HIGH (ref 70–99)
Potassium: 4.2 mEq/L (ref 3.5–5.1)
Sodium: 138 mEq/L (ref 135–145)
Total Bilirubin: 0.4 mg/dL (ref 0.2–1.2)
Total Protein: 7.3 g/dL (ref 6.0–8.3)

## 2021-03-21 LAB — LIPASE: Lipase: 57 U/L (ref 11.0–59.0)

## 2021-03-21 NOTE — Patient Instructions (Signed)
Your provider has requested that you go to the basement level for lab work before leaving today. Press "B" on the elevator. The lab is located at the first door on the left as you exit the elevator.   You will be contacted by [facility] in the next 2 days to arrange a HIDA SCAN  The number on your caller ID will be [phone], please answer when they call.  If you have not heard from them in 2 days please call [phone] to schedule.     You have been scheduled for a HIDA scan at [facility] (1st floor) on _________. Please arrive 15 minutes prior to your scheduled appointment at  ________. Make certain not to have anything to eat or drink at least 6 hours prior to your test. Should this appointment date or time not work well for you, please call radiology scheduling at [phone].  _____________________________________________________________________ hepatobiliary (HIDA) scan is an imaging procedure used to diagnose problems in the liver, gallbladder and bile ducts. In the HIDA scan, a radioactive chemical or tracer is injected into a vein in your arm. The tracer is handled by the liver like bile. Bile is a fluid produced and excreted by your liver that helps your digestive system break down fats in the foods you eat. Bile is stored in your gallbladder and the gallbladder releases the bile when you eat a meal. A special nuclear medicine scanner (gamma camera) tracks the flow of the tracer from your liver into your gallbladder and small intestine.  During your HIDA scan  You'll be asked to change into a hospital gown before your HIDA scan begins. Your health care team will position you on a table, usually on your back. The radioactive tracer is then injected into a vein in your arm.The tracer travels through your bloodstream to your liver, where it's taken up by the bile-producing cells. The radioactive tracer travels with the bile from your liver into your gallbladder and  through your bile ducts to your small intestine.You may feel some pressure while the radioactive tracer is injected into your vein. As you lie on the table, a special gamma camera is positioned over your abdomen taking pictures of the tracer as it moves through your body. The gamma camera takes pictures continually for about an hour. You'll need to keep still during the HIDA scan. This can become uncomfortable, but you may find that you can lessen the discomfort by taking deep breaths and thinking about other things. Tell your health care team if you're uncomfortable. The radiologist will watch on a computer the progress of the radioactive tracer through your body. The HIDA scan may be stopped when the radioactive tracer is seen in the gallbladder and enters your small intestine. This typically takes about an hour. In some cases extra imaging will be performed if original images aren't satisfactory, if morphine is given to help visualize the gallbladder or if the medication CCK is given to look at the contraction of the gallbladder. This test typically takes 2 hours to complete. ________________________________________________________________________   Use abdominal wall binder or back support belt daily when walking or doing activity  Due to recent changes in healthcare laws, you may see the results of your imaging and laboratory studies on MyChart before your provider has had a chance to review them.  We understand that in some cases there may be results that are confusing or concerning to you. Not all laboratory results come back in the  same time frame and the provider may be waiting for multiple results in order to interpret others.  Please give [name] 48 hours in order for your provider to thoroughly review all the results before contacting the office for clarification of your results.    I appreciate the  opportunity to care for you  Thank You   Harl Bowie , MD

## 2021-03-21 NOTE — Progress Notes (Signed)
Courtney Pena    [id number]    09-Apr-1940  Primary Care Physician:Hensel, Jamal Collin, MD  Referring Physician: [name], MD [address] [place],   [address]   Chief complaint: Right-sided abdominal pain  HPI: 81 year old very pleasant African-American female with complain of persistent right-sided abdominal pain.    At times she feels that it is coming from deep inside radiating to her groin and thigh but other times it feels more like superficial burning of her skin.  Denies any skin rash or redness.  She history of shingles and has recovered from that long time ago.  Pain is worse when she does any kind of activity, is better when she lays down. Denies any blood in stool or recent change in bowel habits She has lost significant weight since starting Ozempic, her A1c has improved to below 6.5 from 9 2 years ago  MRI abdomen 02/10/21 1. Probable small gallstones layering in the gallbladder given the somewhat granular appearance on the T2 weighted images. No biliary dilatation. No focal hepatic lesion is identified. 2. 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 The patient has height adjusted total kidney volume places the patient in [facility] class 1A (low risk of renal progression related to the polycystic kidney disease). Based on patient history, the patient does have known chronic diabetic nephropathy. 3.  Aortic Atherosclerosis (ICD10-I70.0). 4. Thoracolumbar spondylosis and degenerative disc disease. 5. 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  Colonoscopy March 26, 2018 - Three 5 to 8 mm polyps in the transverse colon and in the ascending colon, removed with a cold snare. Resected and retrieved. - Diverticulosis in the sigmoid colon and in the descending colon. - Non-bleeding internal hemorrhoids.  Surgical [P], transverse and ascending; polyp (3) - TUBULAR ADENOMA (X2 FRAGMENTS). - SESSILE  SERRATED POLYP WITHOUT DYSPLASIA. - NO HIGH GRADE DYSPLASIA OR MALIGNANCY.  EGD March 26, 2018: Normal  Capsule endoscopy for iron deficiency anemia 04/15/2018 unremarkable  EGD May 2017 was normal exam with no evidence of GAVE.    Colonoscopy April 2012 showed sigmoid diverticulosis and had removal of diminutive polyp but was not adenoma but benign colonic mucosa based on pathology report.   HIDA scan 04/10/2017 showed decreased gallbladder EF 20% suggestive of biliary dyskinesia but no other abnormality.    Outpatient Encounter Medications as of 03/21/2021  Medication Sig   acetaminophen (TYLENOL) 650 MG CR tablet Take 650 mg by mouth every 8 (eight) hours as needed for pain.   apixaban (ELIQUIS) 5 MG TABS tablet Take 1 tablet (5 mg total) by mouth 2 (two) times daily.   Apoaequorin (PREVAGEN PO) Take 1 tablet by mouth.   blood glucose meter kit and supplies Dispense based on patient and insurance preference. Use up to four times daily as directed. (FOR ICD-10 E10.9, E11.9).   cetirizine (ZYRTEC) 10 MG tablet Take 10 mg by mouth as needed for allergies. Takes 10 mg as needed for sinus trouble   Cholecalciferol (VITAMIN D3) 2000 units TABS Take 2 capsules by mouth daily.   Coenzyme Q10 (COQ10) 100 MG CAPS Take 100 mEq by mouth daily.    colchicine 0.6 MG tablet Take 1 tablet (0.6 mg total) by mouth as needed.   diclofenac Sodium (VOLTAREN) 1 % GEL Apply 2 g topically as needed (pain).   diltiazem (CARDIZEM CD) 360 MG 24 hr capsule TAKE 1 CAPSULE BY MOUTH  DAILY   diltiazem (CARDIZEM) 30 MG  tablet Take 1 tablet (30 mg total) by mouth every 4 (four) hours as needed (elevated HR as long as BP > 100).   DULoxetine (CYMBALTA) 30 MG capsule TAKE 1 CAPSULE BY MOUTH EVERY DAY   ferrous sulfate 325 (65 FE) MG EC tablet Take 650 mg by mouth daily.    fluticasone (FLONASE) 50 MCG/ACT nasal spray Place 2 sprays into both nostrils daily.   glucose blood (ONETOUCH ULTRA) test strip 1 Container by  Other route every morning.   Insulin Pen Needle 31G X 8 MM MISC Use with pen to inject once daily.   Lancets (ONETOUCH ULTRASOFT) lancets Use to check blood sugar daily.   levothyroxine (SYNTHROID) 112 MCG tablet Take 1 tablet (112 mcg total) by mouth daily before breakfast.   lidocaine (XYLOCAINE) 5 % ointment APPLY TO AFFECTED AREA  four times per day AS NEEDED   LORazepam (ATIVAN) 0.5 MG tablet Take 1 tablet 30 minutes prior to MRI.   meclizine (ANTIVERT) 25 MG tablet TAKE 1 TABLET (25 MG TOTAL) BY MOUTH AS NEEDED.   metFORMIN (GLUCOPHAGE-XR) 500 MG 24 hr tablet TAKE 4 TABLETS BY MOUTH  DAILY   metoprolol succinate (TOPROL-XL) 100 MG 24 hr tablet TAKE 1 TABLET BY MOUTH  TWICE DAILY WITH OR  IMMEDIATELY FOLLOWING MEALS   Multiple Vitamin (MULTI-VITAMINS) TABS Take daily by mouth.   omeprazole (PRILOSEC) 40 MG capsule TAKE 1 CAPSULE BY MOUTH  DAILY   ONETOUCH ULTRA test strip USE TO TEST BLOOD SUGAR 3  TIMES DAILY   rosuvastatin (CRESTOR) 10 MG tablet TAKE 1 TABLET BY MOUTH  DAILY   Semaglutide, 1 MG/DOSE, 2 MG/1.5ML SOPN Inject 1 mg into the skin once a week. Patient taking 2.1HY (by reducing clicks)   traMADol (ULTRAM) 50 MG tablet Take 1 tablet (50 mg total) by mouth every 6 (six) hours as needed.   triamcinolone cream (KENALOG) 0.1 % Apply 1 application topically as needed (dermatitis).   nitroGLYCERIN (NITROSTAT) 0.4 MG SL tablet Place 1 tablet (0.4 mg total) under the tongue every 5 (five) minutes as needed for chest pain (Call 911 if chest pain after three doses). (Patient not taking: Reported on 03/21/2021)   No facility-administered encounter medications on file as of 03/21/2021.    Allergies as of 03/21/2021 - Review Complete 03/21/2021  Allergen Reaction Noted   Actos [pioglitazone] Swelling 01/07/2006   Lisinopril Swelling 01/07/2006   Nsaids Other (See Comments) 08/12/2011   Penicillins Shortness Of Breath and Swelling 12/26/2010   Sulfamethoxazole Hives and Itching 01/07/2006    Flecainide Other (See Comments) 10/13/2015   Hctz [hydrochlorothiazide] Other (See Comments) 02/04/2019   Iodinated contrast media Itching 12/29/2015   Jardiance [empagliflozin] Other (See Comments) 05/25/2019   Statins Other (See Comments) 01/31/2017   Gabapentin Other (See Comments) 06/23/2015   Losartan potassium Other (See Comments) 10/13/2015   Zetia [ezetimibe] Other (See Comments) 09/19/2017    Past Medical History:  Diagnosis Date   Allergy    Anemia due to GI blood loss 08/12/2011   Arthritis    Atrial flutter ([place]) 12-07-2010   converted in ED with 300 mg flecainide   CAD (coronary artery disease)    a. mild per cath in 2004;  b. nonischemic Myoview in March 2012;  c. Lex MV 1/14:  EF 66%, no ischemia   Cataract    Chronic anticoagulation - Xarelto started 07/07/2015, CHADS2CVASC=5 07/07/2015   Clotting disorder (HCC)    Diastolic CHF ([place]) 8/65/7846   External hemorrhoids 06/07/2010  Gastric antral vascular ectasia    source for gi bleed in 07/2011 - Xarelto stopped   Gastritis    GERD (gastroesophageal reflux disease)    Hiatal hernia    Hyperlipidemia    Hypertension    Hypothyroidism    Neuromuscular disorder (HCC)    Obesity    Paroxysmal atrial fibrillation (HCC)    Personal history of colonic polyps 06/06/2009   cecal polyp   Sleep apnea    does not use CPAP   Type 2 diabetes mellitus with diabetic chronic kidney disease ([place]) 04/25/2006       Ulcer     Past Surgical History:  Procedure Laterality Date   BREAST BIOPSY Left    BREAST EXCISIONAL BIOPSY Left    benign   CARDIAC CATHETERIZATION  1999&2004   CARPAL TUNNEL RELEASE Bilateral 2003   CATARACT EXTRACTION Bilateral    COLONOSCOPY     ESOPHAGOGASTRODUODENOSCOPY  08/13/2011   Procedure: ESOPHAGOGASTRODUODENOSCOPY (EGD);  Surgeon: Gatha Mayer, MD;  Location[facility] ENDOSCOPY;  Service: Endoscopy;  Laterality: N/A;   implantable loop recorder placement  07/20/2020   Medtronic Reveal Linq model  LNQ 22 ([id number] [id number]) implantable loop recorder by Dr Rayann Heman   POLYPECTOMY      Family History  Problem Relation Age of Onset   Heart disease Father    Diabetes Maternal Grandfather    Hypertension Mother    Other Mother        brain tumor-benign   Diabetes Daughter        pre-diabeties   Colon polyps Daughter    Thyroid disease Daughter        x 2   Colon cancer Neg Hx    Esophageal cancer Neg Hx    Stomach cancer Neg Hx    Rectal cancer Neg Hx     Social History   Socioeconomic History   Marital status: Widowed    Spouse name: Iona Beard   Number of children: 4   Years of education: some colle   Highest education level: Not on file  Occupational History   Occupation: Games developer: UNEMPLOYED  Tobacco Use   Smoking status: Former    Packs/day: 2.00    Years: 30.00    Pack years: 60.00    Types: Cigarettes    Quit date: 02/26/1993    Years since quitting: 28.0   Smokeless tobacco: Never  Vaping Use   Vaping Use: Never used  Substance and Sexual Activity   Alcohol use: No    Alcohol/week: 0.0 standard drinks   Drug use: No   Sexual activity: Not Currently  Other Topics Concern   Not on file  Social History Narrative   Not on file   Social Determinants of Health   Financial Resource Strain: Not on file  Food Insecurity: Not on file  Transportation Needs: Not on file  Physical Activity: Not on file  Stress: Not on file  Social Connections: Not on file  Intimate Partner Violence: Not on file      Review of systems: All other review of systems negative except as mentioned in the HPI.   Physical Exam: Vitals:   03/21/21 0837  BP: (!) 150/80  Pulse: 72   Body mass index is 30.15 kg/m. Gen:      No acute distress HEENT:  sclera anicteric Abd:      soft, non-tender; no palpable masses, no distension, ventral hernia with weak abdominal wall musculature Ext:    No edema  Neuro: alert and oriented x 3 Psych: normal mood and affect  Data  Reviewed:  Reviewed labs, radiology imaging, old records and pertinent past GI work up   Assessment and Plan/Recommendations:  81 year old very pleasant female with history of chronic iron deficiency anemia, type 2 diabetes, hypothyroidism, thoracolumbar degenerative disc disease with radiculopathy  Right side abdominal pain is likely multifactorial She has gallstones, pancreas divisum, polycystic kidney disease and also ventral hernia Will obtain HIDA scan to exclude chronic cholecystitis, she had lower EF gallbladder in the past  History of pancreas divisum, do not recommend ERCP or surgery Follow-up CMP and lipase  Advised patient to use abdominal wall binder/back support belt whenever she is doing any activity to reduce the hernia and also to help with thoracolumbar spine spondylosis and radiculopathy Do not recommend hernia repair given her age and comorbid conditions  Chronic iron deficiency anemia: Continue to monitor Follow-up CBC Oral iron daily  History of colon polyps: No recall colonoscopy due to age  Return in 4 to 6 weeks   This visit required >45 minutes of patient care (this includes precharting, chart review, review of results, face-to-face time used for counseling as well as treatment plan and follow-up. The patient was provided an opportunity to ask questions and all were answered. The patient agreed with the plan and demonstrated an understanding of the instructions.  Damaris Hippo , MD    CC: [name], MD

## 2021-03-22 ENCOUNTER — Ambulatory Visit
Admission: RE | Admit: 2021-03-22 | Discharge: 2021-03-22 | Disposition: A | Discharge status: Home / Self Care | Payer: Medicare Other | Source: Ambulatory Visit | Attending: Family Medicine | Admitting: Family Medicine

## 2021-03-22 DIAGNOSIS — Z1231 Encounter for screening mammogram for malignant neoplasm of breast: Secondary | ICD-10-CM

## 2021-03-24 ENCOUNTER — Other Ambulatory Visit: Payer: Self-pay | Admitting: Family Medicine

## 2021-03-24 DIAGNOSIS — R928 Other abnormal and inconclusive findings on diagnostic imaging of breast: Secondary | ICD-10-CM

## 2021-03-26 ENCOUNTER — Other Ambulatory Visit: Payer: Self-pay | Admitting: Family Medicine

## 2021-03-26 DIAGNOSIS — E039 Hypothyroidism, unspecified: Secondary | ICD-10-CM

## 2021-03-30 NOTE — Progress Notes (Signed)
[facility] Summary Report / Loop Recorder 

## 2021-04-03 ENCOUNTER — Other Ambulatory Visit: Payer: Self-pay

## 2021-04-03 ENCOUNTER — Encounter (HOSPITAL_COMMUNITY)
Admission: RE | Admit: 2021-04-03 | Discharge: 2021-04-03 | Disposition: A | Discharge status: Home / Self Care | Payer: Medicare Other | Source: Ambulatory Visit | Attending: Gastroenterology | Admitting: Gastroenterology

## 2021-04-03 DIAGNOSIS — K8020 Calculus of gallbladder without cholecystitis without obstruction: Secondary | ICD-10-CM | POA: Diagnosis present

## 2021-04-03 DIAGNOSIS — R109 Unspecified abdominal pain: Secondary | ICD-10-CM | POA: Insufficient documentation

## 2021-04-03 DIAGNOSIS — Q613 Polycystic kidney, unspecified: Secondary | ICD-10-CM | POA: Insufficient documentation

## 2021-04-03 MED ORDER — TECHNETIUM TC 99M MEBROFENIN IV KIT
5.4000 | PACK | Freq: Once | INTRAVENOUS | Status: AC | PRN
  Administered 2021-04-03: 5.4 via INTRAVENOUS

## 2021-04-04 ENCOUNTER — Telehealth: Payer: Self-pay

## 2021-04-04 NOTE — Telephone Encounter (Signed)
Spoke with pt regarding med pickup. 4 boxes of ozempic ready & labeled in med room fridge.

## 2021-04-05 ENCOUNTER — Telehealth: Payer: Self-pay | Admitting: Family Medicine

## 2021-04-05 NOTE — Telephone Encounter (Signed)
Patient is calling and would like for Dr. Andria Frames to call her. She received results from xray and she does have to have surgery. She has a few things she would like to discuss with Dr. Andria Frames concerning meds and this surgery.   I informed her she had an appointment coming up but she said she prefers to speak with him before the appointment.  Please call patient at [phone].

## 2021-04-05 NOTE — Telephone Encounter (Signed)
Called and discussed her multifactorial RUQ pain.  Agree with cholecystectomy and I am not sure how much pain relief she will get from the surgery.  We will discuss in detail during her upcoming appointment.

## 2021-04-06 ENCOUNTER — Telehealth: Payer: Self-pay | Admitting: *Deleted

## 2021-04-06 NOTE — Telephone Encounter (Signed)
Patient is scheduled to see Dr. Johnsie Cancel at the end of this month.  Cardiac clearance can be addressed at that time by MD.

## 2021-04-06 NOTE — Telephone Encounter (Signed)
° °  Pre-operative Risk Assessment    Patient Name: Courtney Pena  DOB: 03-26-1940 MRN: [id number]      Request for Surgical Clearance    Procedure:   LAP CHOLE lap Date of Surgery:  Clearance TBD                                 Surgeon:  DR. PAUL STECHSCHULTE Surgeon's Group or Practice Name:  [facility] Phone number:  [phone] Fax number:  [phone] ATTN: CHARMELLA LITTLE, CNA   Type of Clearance Requested:   - Medical  - Pharmacy:  Hold Apixaban (Eliquis)     Type of Anesthesia:  General    Additional requests/questions:    Jiles Prows   04/06/2021, 12:50 PM

## 2021-04-06 NOTE — Telephone Encounter (Signed)
Clinical pharmacist to review Eliquis 

## 2021-04-06 NOTE — Telephone Encounter (Signed)
Patient with diagnosis of afib on Eliquis for anticoagulation.    Procedure: LAP CHOLE Date of procedure: TBD  CHA2DS2-VASc Score = 9   This indicates a 12.2% annual risk of stroke. 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  I will confirm with Dr. Johnsie Cancel he is ok with patient holding Eliquis for 2 days prior to procedure.

## 2021-04-10 NOTE — Telephone Encounter (Signed)
Awaiting input from Dr. Johnsie Cancel as below so that this information is available at time of upcoming OV. Per appt desk, has OV with Urban Gibson 2/21 to address preop clearance. Will cc her here as well. No need to respond, [place], FYI only.

## 2021-04-10 NOTE — Telephone Encounter (Signed)
° °  Patient Name: Courtney Pena  DOB: [birth year] MRN: [id number]  Primary Cardiologist: [name], MD  Covering preop box today. As below, pharmacy team is waiting input from Dr. Johnsie Cancel as below so that this information is available at time of upcoming pre-op visit with Laurann Montana on 2/21. Will cc her here as well. No need to respond, [place], FYI only.  Will route to requesting surgeon that patient has OV 2/21 to discuss preop clearance.  Charlie Pitter, PA-C 04/10/2021, 1:10 PM

## 2021-04-10 NOTE — Telephone Encounter (Addendum)
Anticoag rec from Dr. Johnsie Cancel now available for APP to review at [place] 2/21.

## 2021-04-11 ENCOUNTER — Ambulatory Visit: Payer: Medicare Other | Admitting: Gastroenterology

## 2021-04-17 ENCOUNTER — Other Ambulatory Visit: Payer: Self-pay

## 2021-04-17 ENCOUNTER — Ambulatory Visit: Payer: Medicare Other

## 2021-04-17 ENCOUNTER — Ambulatory Visit
Admission: RE | Admit: 2021-04-17 | Discharge: 2021-04-17 | Disposition: A | Discharge status: Home / Self Care | Payer: Medicare Other | Source: Ambulatory Visit | Attending: Family Medicine | Admitting: Family Medicine

## 2021-04-17 DIAGNOSIS — R928 Other abnormal and inconclusive findings on diagnostic imaging of breast: Secondary | ICD-10-CM

## 2021-04-17 NOTE — Progress Notes (Signed)
Office Visit    Patient Name: Courtney Pena Date of Encounter: 04/18/2021  PCP:  [name], MD   [facility]  Cardiologist:  [name], MD  Advanced Practice Provider:  No care team member to display Electrophysiologist:  None   Chief Complaint    Courtney Pena is a 81 y.o. female with a hx of DM, hypertension, hyperlipidemia, obesity, aflutter (off xarelto 2013 secondary to UGIB now on Eliquis), CHADS2VASC of 5 presents today for pre-op clearance for a lap chole.    She was recently seen by the Afib clinic October 2022 and was started on flecainide in early June by Dr. Johnsie Cancel and was seen in the clinic for atrial fibrillation. She has not noticed any sustained Afib. She was tolerating xarelto with no bleeding issues. She did have f/u ETT for which she did not stay on the treadmill for a minute but no EKG changes were noted with exertion.   She returned to the afib clinic 8/17 for c/o blurry vision. She states that she has noted vision changes and she stopped her flecainide and her vision was already better. She was in NSR and her afib burden was low. She also stopped losartan because she was retaining fluid with previous use. Her PCP wanted her to try one more time but it occurred again.   She was given options for ablation by Dr. Rayann Heman 12/17. She thought she was more symptomatic from her PACs than her Afib at this point. She was seen 06/2016 for increased Afib burden. She had been on flecainide in the past but did not tolerate it. 02/2017 she was seen in the clinic for f.u of event monitor placed by Dr. Johnsie Cancel after increasing metoprolol to 83m BID. Monitor did not show any afib or significant PC burden. She did not report having to take any of her extra Cardizem. She was seen 11/21 due to a recent ER visit for Afib with RVR. She was started on a cardizem drip and converted to NSR. She had run out of her metoprolol the previous week.  She remained on Eliquis. She was seen 10/22 for a 4 hour history of atrial fibrillation. She reported recent steroid prescription of shingles which may have triggered her Afib.   She was last seen Oct 2022 and at that time she denied palpitations.   She presents today for pre-op clearance.  Overall, she has been doing well from a CV standpoint.  She states she did have an episode of symptomatic atrial fibrillation late November early December which resolved on its own.  She has not had one since.  She is still having symptoms from her shingles back in the fall.  She sees her primary care this week and plans to address it with them.  She also states she has a mass on her right side that they have been working up and may need a possible excision while she is getting her lap chole.  Of note, she did go into atrial fibrillation during a colonoscopy due to the medication she was given at that time.  I think this would be important for anesthesia to know with her upcoming surgery.  She remains anticoagulated on Eliquis without any significant bleeding.  Per Dr. NJohnsie Cancelit is okay to hold the Eliquis 2 days before her procedure.   Reports no shortness of breath nor dyspnea on exertion. Reports no chest pain, pressure, or tightness. No edema, orthopnea, PND.  Past Medical  History    Past Medical History:  Diagnosis Date   Allergy    Anemia due to GI blood loss 08/12/2011   Arthritis    Atrial flutter ([place]) 12-07-2010   converted in ED with 300 mg flecainide   CAD (coronary artery disease)    a. mild per cath in 2004;  b. nonischemic Myoview in March 2012;  c. Lex MV 1/14:  EF 66%, no ischemia   Cataract    Chronic anticoagulation - Xarelto started 07/07/2015, CHADS2CVASC=5 07/07/2015   Clotting disorder (HCC)    Diastolic CHF ([place]) 1/91/4782   External hemorrhoids 06/07/2010   Gastric antral vascular ectasia    source for gi bleed in 07/2011 - Xarelto stopped   Gastritis    GERD (gastroesophageal  reflux disease)    Hiatal hernia    Hyperlipidemia    Hypertension    Hypothyroidism    Neuromuscular disorder ([place])    Obesity    Paroxysmal atrial fibrillation ([place])    Personal history of colonic polyps 06/06/2009   cecal polyp   Sleep apnea    does not use CPAP   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 END[facility]COPY;  Service: Endoscopy;  Laterality: N/A;   implantable loop recorder placement  07/20/2020   Medtronic Reveal Linq model LNQ 22 ([place] [id number]) implantable loop recorder by Dr Rayann Heman   POLYPECTOMY      Allergies  Allergies  Allergen Reactions   Actos [Pioglitazone] Swelling    Swelling all over body and moonface   Lisinopril Swelling    Swelling, may have had some breathing involvement. Angioedema   Nsaids Other (See Comments)    Patient reports internal bleeding. Experienced with tolmetin.    Penicillins Shortness Of Breath and Swelling    Arm Swelling with Penicillin (Occurred in 1960s) Breathing - throat swelling with Amoxicillin (Occurred prior to 2002)   Sulfamethoxazole Hives and Itching    "welps all over" immediately after dose   Flecainide Other (See Comments)    Blurry  vision   Hctz [Hydrochlorothiazide] Other (See Comments)    Gout   Iodinated Contrast Media Itching    Pt. Developed mild itching after receiving IV cm; pt. Held; Dr. Keane Scrape recomended she take 50 mg of benadryl when she goes home-if necessary; Dr Mickey Farber recommends benadryl prior to future exams requiring contrast media, but stated other doctors may recommend  another premedication prep.   Jardiance [Empagliflozin] Other (See Comments)    Patient reported "it made her kidneys not work"  Patient resists retrial/alternative.   Statins Other (See Comments)    Muscle aches with multiple statins. Able to tolerate rosuvastatin.   Gabapentin Other (See Comments)    Caused dysphoria    Losartan Potassium Other (See Comments)    Lower extremity swelling   Zetia [Ezetimibe] Other (See Comments)    Cramps      EKGs/Labs/Other Studies Reviewed:   The following studies were reviewed today:  Lexiscan Myoview 05/10/2020  Nuclear stress EF: 66%. The left ventricular ejection fraction is hyperdynamic (>  65%). There was no ST segment deviation noted during stress. The study is normal. This is a low risk study.   No ischemia or infarction on perfusion images. Mild perfusion defect at segment 17 apical cap - likely attenuation artifact. Normal wall motion.   Echocardiogram 12/18/19  IMPRESSIONS     1. Left ventricular ejection fraction, by estimation, is 65 to 70%. The  left ventricle has normal function. The left ventricle has no regional  wall motion abnormalities. There is mild left ventricular hypertrophy.  Left ventricular diastolic parameters  are indeterminate. Elevated left atrial pressure.   2. Right ventricular systolic function is normal. The right ventricular  size is normal.   3. A small pericardial effusion is present. There is no evidence of  cardiac tamponade.   4. The mitral valve is degenerative. Trivial mitral valve regurgitation.  No evidence of mitral stenosis.   5. The aortic valve has an indeterminant number of cusps. Aortic valve  regurgitation is not visualized. No aortic stenosis is present.   6. The inferior vena cava is normal in size with greater than 50%  respiratory variability, suggesting right atrial pressure of 3 mmHg.   EKG:  EKG is  ordered today.  The ekg ordered today demonstrates NSR, rate 64  bpm  Recent Labs: 03/21/2021: ALT 10; BUN 15; Creatinine, Ser 0.93; Hemoglobin 10.9; Platelets 214.0; Potassium 4.2; Sodium 138  Recent Lipid Panel    Component Value Date/Time   CHOL 144 07/14/2020 1121   TRIG 77 07/14/2020 1121   HDL 60 07/14/2020 1121   CHOLHDL 2.4 07/14/2020 1121   CHOLHDL 4.7 01/27/2015 1238   VLDL 28 01/27/2015 1238   LDLCALC 69 07/14/2020 1121   LDLDIRECT 62 11/24/2018 1211   LDLDIRECT 178 (H) 05/12/2015 1229    Risk Assessment/Calculations:   CHA2DS2-VASc Score = 9   This indicates a 12.2% annual risk of stroke. The patient's score is based upon: CHF History: 1 HTN History: 1 Diabetes History: 1 Stroke History: 2 Vascular Disease History: 1 Age Score: 2 Gender Score: 1     Home Medications   Current Meds  Medication Sig   acetaminophen (TYLENOL) 650 MG CR tablet Take 650 mg by mouth every 8 (eight) hours as needed for pain.   apixaban (ELIQUIS) 5 MG TABS tablet Take 1 tablet (5 mg total) by mouth 2 (two) times daily.   Apoaequorin (PREVAGEN PO) Take 1 tablet by mouth.   blood glucose meter kit and supplies Dispense based on patient and insurance preference. Use up to four times daily as directed. (FOR ICD-10 E10.9, E11.9).   cetirizine (ZYRTEC) 10 MG tablet Take 10 mg by mouth as needed for allergies. Takes 10 mg as needed for sinus trouble   Cholecalciferol (VITAMIN D3) 2000 units TABS Take 2 capsules by mouth daily.   Coenzyme Q10 (COQ10) 100 MG CAPS Take 100 mEq by mouth daily.    colchicine 0.6 MG tablet Take 1 tablet (0.6 mg total) by mouth as needed.   diclofenac Sodium (VOLTAREN) 1 % GEL Apply 2 g topically as needed (pain).   diltiazem (CARDIZEM CD) 360 MG 24 hr capsule TAKE 1 CAPSULE BY MOUTH  DAILY   diltiazem (CARDIZEM) 30 MG tablet Take 1 tablet (30 mg total) by mouth every 4 (four) hours as needed (elevated HR as long as BP > 100).   DULoxetine (CYMBALTA) 30 MG capsule TAKE 1 CAPSULE BY MOUTH EVERY DAY   ferrous sulfate 325 (65 FE)  MG EC tablet Take  650 mg by mouth daily.    fluticasone (FLONASE) 50 MCG/ACT nasal spray Place 2 sprays into both nostrils daily.   glucose blood (ONETOUCH ULTRA) test strip 1 Container by Other route every morning.   Insulin Pen Needle 31G X 8 MM MISC Use with pen to inject once daily.   Lancets (ONETOUCH ULTRASOFT) lancets Use to check blood sugar daily.   levothyroxine (SYNTHROID) 112 MCG tablet TAKE 1 TABLET BY MOUTH DAILY  BEFORE BREAKFAST   lidocaine (XYLOCAINE) 5 % ointment APPLY TO AFFECTED AREA  four times per day AS NEEDED   meclizine (ANTIVERT) 25 MG tablet TAKE 1 TABLET (25 MG TOTAL) BY MOUTH AS NEEDED.   metFORMIN (GLUCOPHAGE-XR) 500 MG 24 hr tablet TAKE 4 TABLETS BY MOUTH  DAILY   metoprolol succinate (TOPROL-XL) 100 MG 24 hr tablet TAKE 1 TABLET BY MOUTH  TWICE DAILY WITH OR  IMMEDIATELY FOLLOWING MEALS   Multiple Vitamin (MULTI-VITAMINS) TABS Take daily by mouth.   omeprazole (PRILOSEC) 40 MG capsule TAKE 1 CAPSULE BY MOUTH  DAILY   ONETOUCH ULTRA test strip USE TO TEST BLOOD SUGAR 3  TIMES DAILY   rosuvastatin (CRESTOR) 10 MG tablet TAKE 1 TABLET BY MOUTH  DAILY   Semaglutide, 1 MG/DOSE, 2 MG/1.5ML SOPN Inject 1 mg into the skin once a week. Patient taking 9.9JT (by reducing clicks)   traMADol (ULTRAM) 50 MG tablet Take 1 tablet (50 mg total) by mouth every 6 (six) hours as needed.   triamcinolone cream (KENALOG) 0.1 % Apply 1 application topically as needed (dermatitis).   [DISCONTINUED] LORazepam (ATIVAN) 0.5 MG tablet Take 1 tablet 30 minutes prior to MRI.   [DISCONTINUED] nitroGLYCERIN (NITROSTAT) 0.4 MG SL tablet Place 1 tablet (0.4 mg total) under the tongue every 5 (five) minutes as needed for chest pain (Call 911 if chest pain after three doses).     Review of Systems      All other systems reviewed and are otherwise negative except as noted above.  Physical Exam    VS:  BP 140/70    Pulse 64    Ht '5\' 4"'  (1.626 m)    Wt 174 lb (78.9 kg)    SpO2 99%    BMI 29.87  kg/m  , BMI Body mass index is 29.87 kg/m.  Wt Readings from Last 3 Encounters:  04/18/21 174 lb (78.9 kg)  03/21/21 177 lb (80.3 kg)  02/06/21 176 lb 9.6 oz (80.1 kg)     GEN: Well nourished, well developed, in no acute distress. HEENT: normal. Neck: Supple, no JVD, carotid bruits, or masses. Cardiac: RRR, no murmurs, rubs, or gallops. No clubbing, cyanosis, edema.  Radials/PT 2+ and equal bilaterally.  Respiratory:  Respirations regular and unlabored, clear to auscultation bilaterally. GI: Soft, nontender, nondistended. MS: No deformity or atrophy. Skin: Warm and dry, no rash. Neuro:  Strength and sensation are intact. Psych: Normal affect.  Assessment & Plan    Preop OP Clearance [name]'s perioperative risk of a major cardiac event is 0.4% according to the Revised Cardiac Risk Index (RCRI).  Therefore, she is at low risk for perioperative complications.   Her functional capacity is good at 4.4 METs according to the Duke Activity Status Index (DASI). Recommendations: According to ACC/AHA guidelines, no further cardiovascular testing needed.  The patient may proceed to surgery at acceptable risk.   Antiplatelet and/or Anticoagulation Recommendations: Okay to hold Eliquis  2 days before surgery, low PAF burden. Resume as soon as it is medically safe  to do so.   Paroxysmal Afib/Chronic anticoagulation -some symptoms with rapid heart beat and anxiety back in November/early December -No symptoms recently -No bleeding on Eliquis  Hypertension -she does take it at home, 145/80 -Today 140/70 -Continue to monitor BP at home  Diabetes Mellitus -On Ozempic and now she is losing weight -Continue to monitor your blood glucose regularly -Follow-up with PCP   Hyperlipidemia -PCP appointment this week and would recommend a lipid panel and LFTs   Disposition: Follow up 1 week with [name], MD or APP. (She preferred to keep this appointment with Dr.  Johnsie Cancel)  Signed, Elgie Collard, PA-C 04/18/2021, 9:42 AM [place]

## 2021-04-18 ENCOUNTER — Encounter (HOSPITAL_BASED_OUTPATIENT_CLINIC_OR_DEPARTMENT_OTHER): Payer: Self-pay | Admitting: Physician Assistant

## 2021-04-18 ENCOUNTER — Ambulatory Visit (HOSPITAL_BASED_OUTPATIENT_CLINIC_OR_DEPARTMENT_OTHER): Payer: Medicare Other | Admitting: Physician Assistant

## 2021-04-18 VITALS — BP 140/70 | HR 64 | Ht 64.0 in | Wt 174.0 lb

## 2021-04-18 DIAGNOSIS — E782 Mixed hyperlipidemia: Secondary | ICD-10-CM

## 2021-04-18 DIAGNOSIS — I2510 Atherosclerotic heart disease of native coronary artery without angina pectoris: Secondary | ICD-10-CM

## 2021-04-18 DIAGNOSIS — Z7901 Long term (current) use of anticoagulants: Secondary | ICD-10-CM | POA: Diagnosis not present

## 2021-04-18 DIAGNOSIS — I480 Paroxysmal atrial fibrillation: Secondary | ICD-10-CM

## 2021-04-18 DIAGNOSIS — I10 Essential (primary) hypertension: Secondary | ICD-10-CM

## 2021-04-18 MED ORDER — NITROGLYCERIN 0.4 MG SL SUBL: 0.4000 mg | SUBLINGUAL_TABLET | SUBLINGUAL | 3 refills | Status: DC | PRN

## 2021-04-18 NOTE — Patient Instructions (Signed)
Medication Instructions:  Your Physician recommend you continue on your current medication as directed.    *If you need a refill on your cardiac medications before your next appointment, please call your pharmacy*   Testing/Procedures: You are cleared for your procedure, Nicholes Rough, PA will send in your clearance!    Follow-Up: At [facility], you and your health needs are our priority.  As part of our continuing mission to provide you with exceptional heart care, we have created designated Provider Care Teams.  These Care Teams include your primary Cardiologist (physician) and Advanced Practice Providers (APPs -  Physician Assistants and Nurse Practitioners) who all work together to provide you with the care you need, when you need it.  We recommend signing up for the patient portal called "MyChart".  Sign up information is provided on this After Visit Summary.  MyChart is used to connect with patients for Virtual Visits (Telemedicine).  Patients are able to view lab/test results, encounter notes, upcoming appointments, etc.  Non-urgent messages can be sent to your provider as well.   To learn more about what you can do with MyChart, go to [email or web address].    Your next appointment:   Follow up as scheduled with Dr. Johnsie Cancel!

## 2021-04-20 ENCOUNTER — Ambulatory Visit: Payer: Medicare Other | Admitting: Pharmacist

## 2021-04-20 ENCOUNTER — Encounter: Payer: Self-pay | Admitting: Family Medicine

## 2021-04-20 ENCOUNTER — Ambulatory Visit: Payer: Medicare Other | Admitting: Gastroenterology

## 2021-04-20 ENCOUNTER — Ambulatory Visit: Payer: Medicare Other | Admitting: Family Medicine

## 2021-04-20 ENCOUNTER — Other Ambulatory Visit: Payer: Self-pay

## 2021-04-20 DIAGNOSIS — W19XXXA Unspecified fall, initial encounter: Secondary | ICD-10-CM | POA: Diagnosis not present

## 2021-04-20 DIAGNOSIS — E1122 Type 2 diabetes mellitus with diabetic chronic kidney disease: Secondary | ICD-10-CM

## 2021-04-20 DIAGNOSIS — S93491A Sprain of other ligament of right ankle, initial encounter: Secondary | ICD-10-CM

## 2021-04-20 NOTE — Patient Instructions (Addendum)
It was nice to see you today!  We are going to hold Ozempic until after your gallbladder surgery. Resume at current dose (0.5mg  every 10 days) after recovery from surgery.

## 2021-04-20 NOTE — Patient Instructions (Addendum)
Use the cane in the hand opposite the bad leg..   I think your ankle is sprained.  I don't you need x rays. Keep living your best life.   For the new prescription, call and let me know which pharmacy.  [facility] mail service out of [place]   The other is [facility] delivery out of [place].

## 2021-04-20 NOTE — Progress Notes (Signed)
S:     Chief Complaint  Patient presents with   Medication Management    Courtney Pena is a 81 y.o. female who presents for medication management. PMH is significant for shingles, DM, CKD, anemia, Afib. Patient was referred and last seen by Primary Care Provider, Dr. Andria Frames, on 02/06/21.  Today, She arrives in good spirits and presents with assistance of a cane. Patient endorses more energy since weight loss. She recently fell two days ago and hurt her ankle. She is taking Ozempic 0.5mg  every 10 days to maintain neutral weight and improve A1c. She mentioned that she will have to have her gallbladder removed in the near future. Patient also endorses pain from her recent shingles infection, which she described as sharp burning in her right side.  Current diabetes medications include: Ozempic 0.5mg  every 10 days, metformin 500mg  four tablets PM Current Afib medications include: metoprolol XL 100mg , diltiazem 360mg  daily and 30mg  PRN, Eliquis 5mg  BID Current hyperlipidemia medications include: rosuvastatin 10mg   Patient states that She is taking her medications as prescribed. Patient reports adherence to DM, Afib, and HLD medications. Patient states that She does not take her iron supplement, as well as her other supplements and often misses many doses throughout the week.  Do you feel that your medications are working for you? yes Have you been experiencing any side effects to the medications prescribed? no  Insurance coverage: [employer]  Patient denies hypoglycemic events.  Reported home fasting blood sugars: 100-140s   Patient reported dietary habits: Eats 1 meals/day (normally midday) Breakfast:  Dinner: occasional baked potato, beans Snacks: pineapple, oranges, blueberries, bananas, grapes  O:  Physical Exam Constitutional:      Appearance: She is normal weight.  Musculoskeletal:        General: Signs of injury present.     Comments: Recently  fell  Neurological:     Mental Status: She is alert.  Psychiatric:        Mood and Affect: Mood normal.        Behavior: Behavior normal.        Thought Content: Thought content normal.        Judgment: Judgment normal.    Review of Systems  All other systems reviewed and are negative.   Lab Results  Component Value Date   HGBA1C 6.6 02/06/2021   Vitals:   04/20/21 1034  BP: 132/78  Pulse: 71  SpO2: 98%    Lipid Panel     Component Value Date/Time   CHOL 144 07/14/2020 1121   TRIG 77 07/14/2020 1121   HDL 60 07/14/2020 1121   CHOLHDL 2.4 07/14/2020 1121   CHOLHDL 4.7 01/27/2015 1238   VLDL 28 01/27/2015 1238   LDLCALC 69 07/14/2020 1121   LDLDIRECT 62 11/24/2018 1211   LDLDIRECT 178 (H) 05/12/2015 1229   A/P: Diabetes longstanding currently controlled. Patient is able to verbalize appropriate hypoglycemia management plan. Medication adherence appears optimal.   -Hold GLP-1 Ozempic (generic name semaglutide) until after gallbladder surgery. -Continued metformin 500mg  four tablets at bedtime.  -Patient educated on purpose, proper use, and potential adverse effects of.  -Extensively discussed pathophysiology of diabetes, recommended lifestyle interventions, dietary effects on blood sugar control.  -Counseled on s/sx of and management of hypoglycemia.  -Next A1c anticipated March 2023.   ASCVD risk - primary prevention in patient with diabetes. Last LDL at goal of <70 mg/dL. ASCVD risk factors include DM. moderate intensity statin indicated.  -Continued rosuvastatin 10  mg.   Atrial Fibrillation currently controlled. Blood pressure goal of <130/80 mmHg. Medication adherence reported as optimal.  -Continued metoprolol XL 100mg  -Continued diltiazem 360mg  daily and 30mg  PRN -Continued Eliquis (apixaban) 5mg  BID  Written patient instructions provided. Patient verbalized understanding of treatment plan. Total time in face to face counseling 30 minutes.    Follow up  pharmacist as needed. Patient seen with Gala Murdoch, PharmD Candidate, and Rebbeca Paul, PharmD - PGY2 Pharmacy Resident.  Marland Kitchen

## 2021-04-20 NOTE — Assessment & Plan Note (Signed)
Diabetes longstanding currently controlled. Patient is able to verbalize appropriate hypoglycemia management plan. Medication adherence appears optimal.   -Hold GLP-1 Ozempic (generic name semaglutide) until after gallbladder surgery. -Continued metformin 500mg  four tablets at bedtime.  -Patient educated on purpose, proper use, and potential adverse effects of.  -Extensively discussed pathophysiology of diabetes, recommended lifestyle interventions, dietary effects on blood sugar control.

## 2021-04-20 NOTE — Progress Notes (Signed)
Reviewed: I agree with Dr. Koval's documentation and management. 

## 2021-04-21 ENCOUNTER — Encounter: Payer: Self-pay | Admitting: Family Medicine

## 2021-04-21 DIAGNOSIS — S93401A Sprain of unspecified ligament of right ankle, initial encounter: Secondary | ICD-10-CM | POA: Insufficient documentation

## 2021-04-21 DIAGNOSIS — W19XXXA Unspecified fall, initial encounter: Secondary | ICD-10-CM | POA: Insufficient documentation

## 2021-04-21 MED ORDER — EMPAGLIFLOZIN 10 MG PO TABS: 10.0000 mg | ORAL_TABLET | Freq: Every day | ORAL | 3 refills | Status: DC

## 2021-04-21 NOTE — Assessment & Plan Note (Signed)
Trial of low dose jardiance.  Patient aware that it was at one time listed as an intollerance.

## 2021-04-21 NOTE — Progress Notes (Signed)
Office Visit    Patient Name: Courtney Pena Date of Encounter: 04/25/2021  PCP:  [name], MD   [facility]  Cardiologist:  [name], MD  Advanced Practice Provider:  No care team member to display Electrophysiologist: Allred  Chief Complaint    Courtney Pena is a 81 y.o. female with a hx of DM, hypertension, hyperlipidemia, obesity, aflutter (off xarelto 2013 secondary to UGIB now on Eliquis), CHADS2VASC of 5 presents today for pre-op clearance for a lap chole.    She has ILR in place with low PAF burden. She has been intolerant to flecainide She had is on  Eliquis and cardizem She had some bleeding issues with xarelto in past. She continues to have some PAF once during coloscopy and once with steroid injection They can last 3-4 hours then she converts No CAD with normal myovue EF 66% 05/10/20 TTE 12/18/19 EF 65-70% mild LAE 4.2 cm no valve dx.    She has had biliary cholic with positive NM hepato scan no emptying with fatty meal  Dr Thermon Leyland to do surgery at [facility]   Past Medical History    Past Medical History:  Diagnosis Date   Allergy    Anemia due to GI blood loss 08/12/2011   Arthritis    Atrial flutter ([place]) 12-07-2010   converted in ED with 300 mg flecainide   CAD (coronary artery disease)    a. mild per cath in 2004;  b. nonischemic Myoview in March 2012;  c. Lex MV 1/14:  EF 66%, no ischemia   Cataract    Chronic anticoagulation - Xarelto started 07/07/2015, CHADS2CVASC=5 07/07/2015   Clotting disorder (HCC)    Diastolic CHF ([place]) 2/87/6811   External hemorrhoids 06/07/2010   Gastric antral vascular ectasia    source for gi bleed in 07/2011 - Xarelto stopped   Gastritis    GERD (gastroesophageal reflux disease)    Hiatal hernia    Hyperlipidemia    Hypertension    Hypothyroidism    Neuromuscular disorder ([place])    Obesity    Paroxysmal atrial fibrillation ([place])    Personal history of colonic polyps  06/06/2009   cecal polyp   Sleep apnea    does not use CPAP   Type 2 diabetes mellitus with diabetic chronic kidney disease ([place]) 04/25/2006       Ulcer    Past Surgical History:  Procedure Laterality Date   BREAST BIOPSY Left    BREAST EXCISIONAL BIOPSY Left    benign   CARDIAC CATHETERIZATION  1999&2004   CARPAL TUNNEL RELEASE Bilateral 2003   CATARACT EXTRACTION Bilateral    COLONOSCOPY     ESOPHAGOGASTRODUODENOSCOPY  08/13/2011   Procedure: ESOPHAGOGASTRODUODENOSCOPY (EGD);  Surgeon: Gatha Mayer, MD;  Location: [facility] ENDOSCOPY;  Service: Endoscopy;  Laterality: N/A;   implantable loop recorder placement  07/20/2020   Medtronic Reveal Linq model LNQ 22 ([place] [id number]) implantable loop recorder by Dr Rayann Heman   POLYPECTOMY      Allergies  Allergies  Allergen Reactions   Actos [Pioglitazone] Swelling    Swelling all over body and moonface   Lisinopril Swelling    Swelling, may have had some breathing involvement. Angioedema   Nsaids Other (See Comments)    Patient reports internal bleeding. Experienced with tolmetin.    Penicillins Shortness Of Breath and Swelling    Arm Swelling with Penicillin (Occurred in 1960s) Breathing - throat swelling with Amoxicillin (Occurred prior to 2002)  Sulfamethoxazole Hives and Itching    "welps all over" immediately after dose   Flecainide Other (See Comments)    Blurry  vision   Hctz [Hydrochlorothiazide] Other (See Comments)    Gout   Iodinated Contrast Media Itching    Pt. Developed mild itching after receiving IV cm; pt. Held; Dr. Keane Scrape recomended she take 50 mg of benadryl when she goes home-if necessary; Dr Mickey Farber recommends benadryl prior to future exams requiring contrast media, but stated other doctors may recommend another premedication prep.   Statins Other (See Comments)    Muscle aches with multiple statins. Able to tolerate rosuvastatin.   Gabapentin Other (See Comments)    Caused dysphoria    Losartan Potassium  Other (See Comments)    Lower extremity swelling   Zetia [Ezetimibe] Other (See Comments)    Cramps      EKGs/Labs/Other Studies Reviewed:   The following studies were reviewed today:  Lexiscan Myoview 05/10/2020  Nuclear stress EF: 66%. The left ventricular ejection fraction is hyperdynamic (>65%). There was no ST segment deviation noted during stress. The study is normal. This is a low risk study.   No ischemia or infarction on perfusion images. Mild perfusion defect at segment 17 apical cap - likely attenuation artifact. Normal wall motion.   Echocardiogram 12/18/19  IMPRESSIONS     1. Left ventricular ejection fraction, by estimation, is 65 to 70%. The  left ventricle has normal function. The left ventricle has no regional  wall motion abnormalities. There is mild left ventricular hypertrophy.  Left ventricular diastolic parameters  are indeterminate. Elevated left atrial pressure.   2. Right ventricular systolic function is normal. The right ventricular  size is normal.   3. A small pericardial effusion is present. There is no evidence of  cardiac tamponade.   4. The mitral valve is degenerative. Trivial mitral valve regurgitation.  No evidence of mitral stenosis.   5. The aortic valve has an indeterminant number of cusps. Aortic valve  regurgitation is not visualized. No aortic stenosis is present.   6. The inferior vena cava is normal in size with greater than 50%  respiratory variability, suggesting right atrial pressure of 3 mmHg.   EKG:  EKG is  ordered today.  The ekg ordered today demonstrates NSR, rate 64 bpm  Recent Labs: 03/21/2021: ALT 10; BUN 15; Creatinine, Ser 0.93; Hemoglobin 10.9; Platelets 214.0; Potassium 4.2; Sodium 138  Recent Lipid Panel    Component Value Date/Time   CHOL 144 07/14/2020 1121   TRIG 77 07/14/2020 1121   HDL 60 07/14/2020 1121   CHOLHDL 2.4 07/14/2020 1121   CHOLHDL 4.7 01/27/2015 1238   VLDL 28 01/27/2015 1238   LDLCALC  69 07/14/2020 1121   LDLDIRECT 62 11/24/2018 1211   LDLDIRECT 178 (H) 05/12/2015 1229    Risk Assessment/Calculations:   CHA2DS2-VASc Score = 9   This indicates a 12.2% annual risk of stroke. The patient's score is based upon: CHF History: 1 HTN History: 1 Diabetes History: 1 Stroke History: 2 Vascular Disease History: 1 Age Score: 2 Gender Score: 1     Home Medications   Current Meds  Medication Sig   acetaminophen (TYLENOL) 650 MG CR tablet Take 650 mg by mouth every 8 (eight) hours as needed for pain.   apixaban (ELIQUIS) 5 MG TABS tablet Take 1 tablet (5 mg total) by mouth 2 (two) times daily.   Apoaequorin (PREVAGEN PO) Take 1 tablet by mouth daily.   blood glucose meter kit  and supplies Dispense based on patient and insurance preference. Use up to four times daily as directed. (FOR ICD-10 E10.9, E11.9).   cetirizine (ZYRTEC) 10 MG tablet Take 10 mg by mouth daily as needed for allergies.   Cholecalciferol (VITAMIN D3) 2000 units TABS Take 4,000 Units by mouth daily.   Coenzyme Q10 (COQ10) 100 MG CAPS Take 100 mEq by mouth daily.    colchicine 0.6 MG tablet Take 1 tablet (0.6 mg total) by mouth as needed.   diclofenac Sodium (VOLTAREN) 1 % GEL Apply 2 g topically as needed (pain).   diltiazem (CARDIZEM CD) 360 MG 24 hr capsule TAKE 1 CAPSULE BY MOUTH  DAILY   diltiazem (CARDIZEM) 30 MG tablet Take 1 tablet (30 mg total) by mouth every 4 (four) hours as needed (elevated HR as long as BP > 100).   DULoxetine (CYMBALTA) 30 MG capsule TAKE 1 CAPSULE BY MOUTH EVERY DAY   empagliflozin (JARDIANCE) 10 MG TABS tablet Take 1 tablet (10 mg total) by mouth daily.   ferrous sulfate 325 (65 FE) MG EC tablet Take 650 mg by mouth daily.    glucose blood (ONETOUCH ULTRA) test strip 1 Container by Other route every morning.   Insulin Pen Needle 31G X 8 MM MISC Use with pen to inject once daily.   Lancets (ONETOUCH ULTRASOFT) lancets Use to check blood sugar daily.   levothyroxine  (SYNTHROID) 112 MCG tablet TAKE 1 TABLET BY MOUTH DAILY  BEFORE BREAKFAST   meclizine (ANTIVERT) 25 MG tablet TAKE 1 TABLET (25 MG TOTAL) BY MOUTH AS NEEDED.   metFORMIN (GLUCOPHAGE-XR) 500 MG 24 hr tablet TAKE 4 TABLETS BY MOUTH  DAILY (Patient taking differently: Take 2,000 mg by mouth every evening.)   metoprolol succinate (TOPROL-XL) 100 MG 24 hr tablet TAKE 1 TABLET BY MOUTH  TWICE DAILY WITH OR  IMMEDIATELY FOLLOWING MEALS   Multiple Vitamin (MULTI-VITAMINS) TABS Take 1 tablet by mouth daily.   nitroGLYCERIN (NITROSTAT) 0.4 MG SL tablet Place 1 tablet (0.4 mg total) under the tongue every 5 (five) minutes as needed for chest pain (Call 911 if chest pain after three doses).   omeprazole (PRILOSEC) 40 MG capsule TAKE 1 CAPSULE BY MOUTH  DAILY   ONETOUCH ULTRA test strip USE TO TEST BLOOD SUGAR 3  TIMES DAILY   rosuvastatin (CRESTOR) 10 MG tablet TAKE 1 TABLET BY MOUTH  DAILY   Semaglutide, 1 MG/DOSE, 2 MG/1.5ML SOPN Inject 1 mg into the skin once a week. Patient taking 8.1KG (by reducing clicks)   triamcinolone cream (KENALOG) 0.1 % Apply 1 application topically as needed (dermatitis).     Review of Systems      All other systems reviewed and are otherwise negative except as noted above.  Physical Exam    VS:  BP 112/66    Pulse 72    Ht '5\' 4"'  (1.626 m)    Wt 173 lb (78.5 kg)    SpO2 98%    BMI 29.70 kg/m  , BMI Body mass index is 29.7 kg/m.  Wt Readings from Last 3 Encounters:  04/25/21 173 lb (78.5 kg)  04/20/21 176 lb 3.2 oz (79.9 kg)  04/20/21 176 lb 3.2 oz (79.9 kg)    Affect appropriate Healthy:  appears stated age HEENT: normal Neck supple with no adenopathy JVP normal no bruits no thyromegaly Lungs clear with no wheezing and good diaphragmatic motion Heart:  S1/S2 no murmur, no rub, gallop or click PMI normal Abdomen: benighn, BS positve, no tenderness,  no AAA no bruit.  No HSM or HJR Distal pulses intact with no bruits No edema Neuro non-focal Skin warm and  dry No muscular weakness   Assessment & Plan    Preop OP Clearance Ok to have lap choly hold eliquis 2 days prior f/u Dr Thermon Leyland   Paroxysmal Afib/Chronic anticoagulation -some symptoms with rapid heart beat and anxiety back in November/early December -No symptoms recently -No bleeding on Eliquis -F/U Allred should consider Multaq   Hypertension -she does take it at home, 145/80 -Today 140/70 -Continue to monitor BP at home  Diabetes Mellitus -On Ozempic and now she is losing weight -Continue to monitor your blood glucose regularly -Follow-up with PCP - Recently started Jardiance    Hyperlipidemia -PCP appointment this week and would recommend a lipid panel and LFTs   Disposition: Follow up  in  6 months   Signed, [name], MD 04/25/2021, 2:34 PM [place]

## 2021-04-21 NOTE — Assessment & Plan Note (Signed)
Injured in fall.  Does not qualify for x ray per [employer].  Instructed on proper use of cane.

## 2021-04-21 NOTE — Assessment & Plan Note (Signed)
Mechanical fall.  Patient fell at home over an electrical cord she knew was hazardous.  She will not do that again.

## 2021-04-21 NOTE — Progress Notes (Signed)
° ° °  SUBJECTIVE:   CHIEF COMPLAINT / HPI:   DM, fall and right ankle pain. DM.  Seen by Pharm earlier.  Control good.  We discussed adding Jardiance for renal protection.  She is willing.  Of course she is concerned about cost.  The year is early and she has not yet reached the donut hole.   Complicating the matter, jardiance in listed on her allergy as an intolerance.  Going through past prescriptions, I cannot find any Rx for jardiance.  Nor can I find additional notes.  Discussed with patient, she is willing to try.  Fall, clearly a mechanical fall.  Did not have lightheadedness or syncope.  Tripped over electrical cord. Right ankle pain.  Twisted ankle in fall.  Was able to walk right away.  Now using cane.   OBJECTIVE:   BP 132/78    Pulse 71    Ht 5\' 4"  (1.626 m)    Wt 176 lb 3.2 oz (79.9 kg)    SpO2 98%    BMI 30.24 kg/m   Lungs clear Cardiac RRR without m or g Rt ankle.Moderate tenderness over lateral malleolus.  More tender over ant talofibular ligament.  No tenderness over base of 5th metatarsal or to tib/fib compression.  ASSESSMENT/PLAN:   Type 2 diabetes mellitus with diabetic chronic kidney disease (HCC) Trial of low dose jardiance.  Patient aware that it was at one time listed as an intollerance.  Fall Mechanical fall.  Patient fell at home over an electrical cord she knew was hazardous.  She will not do that again.  Right ankle sprain Injured in fall.  Does not qualify for x ray per [employer].  Instructed on proper use of cane.     [name], MD [place]

## 2021-04-24 ENCOUNTER — Ambulatory Visit (INDEPENDENT_AMBULATORY_CARE_PROVIDER_SITE_OTHER): Payer: Medicare Other

## 2021-04-24 ENCOUNTER — Other Ambulatory Visit (HOSPITAL_COMMUNITY): Payer: Self-pay

## 2021-04-24 DIAGNOSIS — I5032 Chronic diastolic (congestive) heart failure: Secondary | ICD-10-CM

## 2021-04-24 LAB — CUP PACEART REMOTE DEVICE CHECK
Date Time Interrogation Session: 20230226230156
Implantable Pulse Generator Implant Date: 20220525

## 2021-04-24 NOTE — Patient Instructions (Addendum)
DUE TO COVID-19 ONLY ONE VISITOR IS ALLOWED TO COME WITH YOU AND STAY IN THE WAITING ROOM ONLY DURING PRE OP AND PROCEDURE.   ?**NO VISITORS ARE ALLOWED IN THE SHORT STAY AREA OR RECOVERY ROOM!!** ? ? ?You are not required to quarantine, however you are required to wear a well-fitted mask when you are out and around people not in your household.  ?Hand Hygiene often ?Do NOT share personal items ?Notify your provider if you are in close contact with someone who has COVID or you develop fever 100.4 or greater, new onset of sneezing, cough, sore throat, shortness of breath or body aches. ? ? Your procedure is scheduled on: Monday, 05-22-21 ? ? Report to [facility] Main Entrance ? ?  Report to admitting at 5:15 AM ? ? Call this number if you have problems the morning of surgery [phone] ? ? Do not eat food :After Midnight. ? ? After Midnight you may have the following liquids until 4:30 AM DAY OF SURGERY ? ?Water ?Black Coffee (sugar ok, NO MILK/CREAM OR CREAMERS)  ?Tea (sugar ok, NO MILK/CREAM OR CREAMERS) regular and decaf                             ?Plain Jell-O (NO RED)                                           ?Fruit ices (not with fruit pulp, NO RED)                                     ?Popsicles (NO RED)                                                                  ?Juice: apple, WHITE grape, WHITE cranberry ?Sports drinks like Gatorade (NO RED) ?Clear broth(vegetable,chicken,beef) ?             ?Oral Hygiene is also important to reduce your risk of infection.                                    ?Remember - BRUSH YOUR TEETH THE MORNING OF SURGERY WITH YOUR REGULAR TOOTHPASTE ? ? Do NOT smoke after Midnight ? ? Take these medicines the morning of surgery with A SIP OF WATER:  Tylenol, Zyrtec, Colchicine, Diltiazem, Omeprazole, Levothyroxine, Metoprolol, Rosuvastatin ? ?How to Manage Your Diabetes ?Before and After Surgery ? ?Why is it important to control my blood sugar before and after  surgery? ?Improving blood sugar levels before and after surgery helps healing and can limit problems. ?A way of improving blood sugar control is eating a healthy diet by: ? Eating less sugar and carbohydrates ? Increasing activity/exercise ? Talking with your doctor about reaching your blood sugar goals ?High blood sugars (greater than 180 mg/dL) can raise your risk of infections and slow your recovery, so you will need to focus on controlling your diabetes during the weeks before  surgery. ?Make sure that the doctor who takes care of your diabetes knows about your planned surgery including the date and location. ? ?How do I manage my blood sugar before surgery? ?Check your blood sugar at least 4 times a day, starting 2 days before surgery, to make sure that the level is not too high or low. ?Check your blood sugar the morning of your surgery when you wake up and every 2 hours until you get to the Short Stay unit. ?If your blood sugar is less than 70 mg/dL, you will need to treat for low blood sugar: ?Do not take insulin. ?Treat a low blood sugar (less than 70 mg/dL) with ? cup of clear juice (cranberry or apple), 4 glucose tablets, OR glucose gel. ?Recheck blood sugar in 15 minutes after treatment (to make sure it is greater than 70 mg/dL). If your blood sugar is not greater than 70 mg/dL on recheck, call [phone] for further instructions. ?Report your blood sugar to the short stay nurse when you get to Short Stay. ? ?If you are admitted to the hospital after surgery: ?Your blood sugar will be checked by the staff and you will probably be given insulin after surgery (instead of oral diabetes medicines) to make sure you have good blood sugar levels. ?The goal for blood sugar control after surgery is 80-180 mg/dL. ? ? ?WHAT DO I DO ABOUT MY DIABETES MEDICATION? ? ?Do not take oral diabetes medicines (pills) the morning of surgery. ? ?THE DAY BEFORE SURGERY:  Do not take Jardiance. ?                        Take  Metformin  ? ?THE MORNING OF SURGERY:  Do not take Jardiance, Metformin, Semaglutide. ? ?Reviewed and Endorsed by [facility] Patient Education Committee, August 2015  ? ? Eliquis - Hold x2 days before surgery  ? ? Stop all vitamins and herbal supplements a week before surgery.   ? ? Stop Motrin, Aleve, Ibuprofen a week before surgery. ?                  ?           You may not have any metal on your body including hair pins, jewelry, and body piercing ? ?           Do not wear make-up, lotions, powders, perfumes or deodorant ? ?Do not wear nail polish including gel and S&S, artificial/acrylic nails, or any other type of covering on natural nails including finger and toenails. If you have artificial nails, gel coating, etc. that needs to be removed by a nail salon please have this removed prior to surgery or surgery may need to be canceled/ delayed if the surgeon/ anesthesia feels like they are unable to be safely monitored.  ? ?Do not shave  48 hours prior to surgery.  ? ?       Contacts, dentures or bridgework may not be worn into surgery. ?   ?Do not bring valuables to the hospital. [place]. ?  ?Patients discharged on the day of surgery will not be allowed to drive home.  Someone NEEDS to stay with you for the first 24 hours after anesthesia. ? ?Special Instructions: Bring a copy of your healthcare power of attorney and living will documents the day of surgery if you haven't scanned them before. ? ?Please read over the following fact sheets  you were given: IF YOU HAVE QUESTIONS ABOUT YOUR PRE-OP INSTRUCTIONS PLEASE CALL [phone] [place]  ? ? [facility] - Preparing for Surgery ?Before surgery, you can play an important role.  Because skin is not sterile, your skin needs to be as free of germs as possible.  You can reduce the number of germs on your skin by washing with CHG (chlorahexidine gluconate) soap before surgery.  CHG is an antiseptic cleaner which kills germs and  bonds with the skin to continue killing germs even after washing. ?Please DO NOT use if you have an allergy to CHG or antibacterial soaps.  If your skin becomes reddened/irritated stop using the CHG and inform your nurse when you arrive at Short Stay. ?Do not shave (including legs and underarms) for at least 48 hours prior to the first CHG shower.  You may shave your face/neck. ? ?Please follow these instructions carefully: ? 1.  Shower with CHG Soap the night before surgery and the  morning of surgery. ? 2.  If you choose to wash your hair, wash your hair first as usual with your normal  shampoo. ? 3.  After you shampoo, rinse your hair and body thoroughly to remove the shampoo.                            ? 4.  Use CHG as you would any other liquid soap.  You can apply chg directly to the skin and wash.  Gently with a scrungie or clean washcloth. ? 5.  Apply the CHG Soap to your body ONLY FROM THE NECK DOWN.   Do   not use on face/ open      ?                     Wound or open sores. Avoid contact with eyes, ears mouth and   genitals (private parts).  ?                     Production manager,  Genitals (private parts) with your normal soap. ?            6.  Wash thoroughly, paying special attention to the area where your    surgery  will be performed. ? 7.  Thoroughly rinse your body with warm water from the neck down. ? 8.  DO NOT shower/wash with your normal soap after using and rinsing off the CHG Soap. ?               9.  Pat yourself dry with a clean towel. ?           10.  Wear clean pajamas. ?           11.  Place clean sheets on your bed the night of your first shower and do not  sleep with pets. ?Day of Surgery : ?Do not apply any lotions/deodorants the morning of surgery.  Please wear clean clothes to the hospital/surgery center. ? ?FAILURE TO FOLLOW THESE INSTRUCTIONS MAY RESULT IN THE CANCELLATION OF YOUR SURGERY ? ?PATIENT SIGNATURE_________________________________ ? ?NURSE  SIGNATURE__________________________________ ? ?________________________________________________________________________  ?

## 2021-04-24 NOTE — Progress Notes (Addendum)
COVID swab appointment: N/A ? ?COVID Vaccine Completed:  Yes x2 ?Date COVID Vaccine completed: ?Has received booster:  Yes x2 ?COVID vaccine manufacturer: [employer]  ? ?Date of COVID positive in last 90 days:  No ? ?PCP - Madison Hickman, MD ?Cardiologist - [name], MD ? ?Surgical clearance in Epic dated 04-18-21 by Nicholes Rough, PA-C ? ?Chest x-ray - 11-06-20 Epic ?EKG - 04-18-21 Epic ?Stress Test - 05-10-20 Epic ?ECHO - 12-18-19 Epic ?Cardiac Cath - greater than 5 years ?Pacemaker/ICD device last checked: ?Spinal Cord Stimulator: ?Telemetry Monitor - 2021 Epic ?Loop recorder - Yes ? ?Bowel Prep - N/A ? ?Sleep Study - Yes, +sleep apnea ?CPAP - No, has not needed since weight loss ? ?Fasting Blood Sugar - 120 to 140 ?Checks Blood Sugar  - 1 time a day ? ?Blood Thinner Instructions: Eliquis.  Hold x2 days.  Patient is aware ?Aspirin Instructions: ?Last Dose: ? ?Activity level:   Can go up a flight of stairs and perform activities of daily living without stopping and without symptoms of chest pain or shortness of breath.  Patient lives alone ?   ?Anesthesia review: Afib, HTN, CHF, DM.  OSA ? ?Patient denies shortness of breath, fever, cough and chest pain at PAT appointment ? ? ?Patient verbalized understanding of instructions that were given to them at the PAT appointment. Patient was also instructed that they will need to review over the PAT instructions again at home before surgery.  ?

## 2021-04-24 NOTE — Progress Notes (Signed)
Requested surgery orders with Dr. Robby Sermon office.

## 2021-04-25 ENCOUNTER — Other Ambulatory Visit: Payer: Self-pay

## 2021-04-25 ENCOUNTER — Ambulatory Visit: Payer: Medicare Other | Admitting: Cardiovascular Disease

## 2021-04-25 ENCOUNTER — Encounter: Payer: Self-pay | Admitting: Cardiovascular Disease

## 2021-04-25 VITALS — BP 112/66 | HR 72 | Ht 64.0 in | Wt 173.0 lb

## 2021-04-25 DIAGNOSIS — I480 Paroxysmal atrial fibrillation: Secondary | ICD-10-CM | POA: Diagnosis not present

## 2021-04-25 DIAGNOSIS — Z01810 Encounter for preprocedural cardiovascular examination: Secondary | ICD-10-CM

## 2021-04-25 DIAGNOSIS — I10 Essential (primary) hypertension: Secondary | ICD-10-CM

## 2021-04-25 DIAGNOSIS — Z7901 Long term (current) use of anticoagulants: Secondary | ICD-10-CM

## 2021-04-25 DIAGNOSIS — E782 Mixed hyperlipidemia: Secondary | ICD-10-CM

## 2021-04-25 NOTE — Patient Instructions (Signed)

## 2021-04-26 ENCOUNTER — Encounter (HOSPITAL_COMMUNITY)
Admission: RE | Admit: 2021-04-26 | Discharge: 2021-04-26 | Disposition: A | Discharge status: Home / Self Care | Payer: Medicare Other | Source: Ambulatory Visit | Attending: Surgery | Admitting: Surgery

## 2021-04-26 ENCOUNTER — Encounter (HOSPITAL_COMMUNITY): Payer: Self-pay

## 2021-04-26 VITALS — BP 147/85 | HR 71 | Temp 98.4°F | Resp 18 | Ht 64.0 in | Wt 170.6 lb

## 2021-04-26 DIAGNOSIS — G4730 Sleep apnea, unspecified: Secondary | ICD-10-CM | POA: Diagnosis not present

## 2021-04-26 DIAGNOSIS — E1122 Type 2 diabetes mellitus with diabetic chronic kidney disease: Secondary | ICD-10-CM | POA: Diagnosis not present

## 2021-04-26 DIAGNOSIS — Z87891 Personal history of nicotine dependence: Secondary | ICD-10-CM | POA: Diagnosis not present

## 2021-04-26 DIAGNOSIS — Z01812 Encounter for preprocedural laboratory examination: Secondary | ICD-10-CM | POA: Insufficient documentation

## 2021-04-26 DIAGNOSIS — K219 Gastro-esophageal reflux disease without esophagitis: Secondary | ICD-10-CM | POA: Insufficient documentation

## 2021-04-26 DIAGNOSIS — Z7901 Long term (current) use of anticoagulants: Secondary | ICD-10-CM | POA: Diagnosis not present

## 2021-04-26 DIAGNOSIS — E039 Hypothyroidism, unspecified: Secondary | ICD-10-CM | POA: Insufficient documentation

## 2021-04-26 DIAGNOSIS — I130 Hypertensive heart and chronic kidney disease with heart failure and stage 1 through stage 4 chronic kidney disease, or unspecified chronic kidney disease: Secondary | ICD-10-CM | POA: Diagnosis not present

## 2021-04-26 DIAGNOSIS — I2510 Atherosclerotic heart disease of native coronary artery without angina pectoris: Secondary | ICD-10-CM | POA: Diagnosis not present

## 2021-04-26 DIAGNOSIS — N189 Chronic kidney disease, unspecified: Secondary | ICD-10-CM | POA: Insufficient documentation

## 2021-04-26 DIAGNOSIS — I5032 Chronic diastolic (congestive) heart failure: Secondary | ICD-10-CM | POA: Diagnosis not present

## 2021-04-26 DIAGNOSIS — K828 Other specified diseases of gallbladder: Secondary | ICD-10-CM | POA: Insufficient documentation

## 2021-04-26 DIAGNOSIS — I480 Paroxysmal atrial fibrillation: Secondary | ICD-10-CM | POA: Diagnosis not present

## 2021-04-26 DIAGNOSIS — E119 Type 2 diabetes mellitus without complications: Secondary | ICD-10-CM

## 2021-04-26 DIAGNOSIS — Z01818 Encounter for other preprocedural examination: Secondary | ICD-10-CM

## 2021-04-26 HISTORY — DX: Family history of other specified conditions: Z84.89

## 2021-04-26 HISTORY — DX: Gout, unspecified: M10.9

## 2021-04-26 HISTORY — DX: Cardiac arrhythmia, unspecified: I49.9

## 2021-04-26 HISTORY — DX: Rheumatoid arthritis, unspecified: M06.9

## 2021-04-26 LAB — CBC
HCT: 35.1 % — ABNORMAL LOW (ref 36.0–46.0)
Hemoglobin: 11 g/dL — ABNORMAL LOW (ref 12.0–15.0)
MCH: 28.6 pg (ref 26.0–34.0)
MCHC: 31.3 g/dL (ref 30.0–36.0)
MCV: 91.4 fL (ref 80.0–100.0)
Platelets: 264 10*3/uL (ref 150–400)
RBC: 3.84 MIL/uL — ABNORMAL LOW (ref 3.87–5.11)
RDW: 15.1 % (ref 11.5–15.5)
WBC: 7 10*3/uL (ref 4.0–10.5)
nRBC: 0 % (ref 0.0–0.2)

## 2021-04-26 LAB — GLUCOSE, CAPILLARY: Glucose-Capillary: 130 mg/dL — ABNORMAL HIGH (ref 70–99)

## 2021-04-26 LAB — BASIC METABOLIC PANEL
Anion gap: 5 (ref 5–15)
BUN: 17 mg/dL (ref 8–23)
CO2: 27 mmol/L (ref 22–32)
Calcium: 9.6 mg/dL (ref 8.9–10.3)
Chloride: 105 mmol/L (ref 98–111)
Creatinine, Ser: 0.78 mg/dL (ref 0.44–1.00)
GFR, Estimated: >60 mL/min (ref >60)
Glucose, Bld: 140 mg/dL — ABNORMAL HIGH (ref 70–99)
Potassium: 4.1 mmol/L (ref 3.5–5.1)
Sodium: 137 mmol/L (ref 135–145)

## 2021-04-26 LAB — HEMOGLOBIN A1C
Hgb A1c MFr Bld: 6.3 % — ABNORMAL HIGH (ref 4.8–5.6)
Mean Plasma Glucose: 134.11 mg/dL

## 2021-04-27 ENCOUNTER — Ambulatory Visit (HOSPITAL_COMMUNITY): Payer: Medicare Other | Admitting: Anesthesiology

## 2021-04-27 ENCOUNTER — Ambulatory Visit (HOSPITAL_COMMUNITY): Payer: Medicare Other | Admitting: Physician Assistant

## 2021-04-27 NOTE — Progress Notes (Signed)
Anesthesia Chart Review ? ? Case: [id number] Date/Time: 05/22/21 0715  ? Procedures:  ?    LAPAROSCOPIC CHOLECYSTECTOMY ?    POSSIBLE RIGHT LATERAL ABDOMINAL WALL HERNIA REPAIR (Right)  ? Anesthesia type: General  ? Pre-op diagnosis: BILIARY DYSKINESIA, RIGHT ABDOMINAL BULDGE  ? Location: [facility] ROOM 05 / [facility]  ? Surgeons: Felicie Morn, MD  ? ?  ? ? ?DISCUSSION:81 y.o. former smoker with h/o GERD, HTN, hypothyroidism, CAD, PAF (on Eliquis), CHF, sleep apnea, DM II, biliary dyskinesia, right abdominal bulge scheduled for above procedure 05/22/2021 with Dr. Elonda Husky.  ? ?Pt last seen by cardiology 04/25/2021. Per OV note, "Ok to have lap choly hold eliquis 2 days prior f/u Dr Thermon Leyland" ? ?Anticipate pt can proceed with planned procedure barring acute status change.   ?VS: BP (!) 147/85   Pulse 71   Temp 36.9 ?C (Oral)   Resp 18   Ht $R'5\' 4"'Vc$  (1.626 m)   Wt 77.4 kg   SpO2 100%   BMI 29.28 kg/m?  ? ?PROVIDERS: ?[name], MD is PCP  ? ?Cardiologist - [name], MD ? ?LABS: Labs reviewed: Acceptable for surgery. ?(all labs ordered are listed, but only abnormal results are displayed) ? ?Labs Reviewed  ?HEMOGLOBIN A1C - Abnormal; Notable for the following components:  ?    Result Value  ? Hgb A1c MFr Bld 6.3 (*)   ? All other components within normal limits  ?BASIC METABOLIC PANEL - Abnormal; Notable for the following components:  ? Glucose, Bld 140 (*)   ? All other components within normal limits  ?CBC - Abnormal; Notable for the following components:  ? RBC 3.84 (*)   ? Hemoglobin 11.0 (*)   ? HCT 35.1 (*)   ? All other components within normal limits  ?GLUCOSE, CAPILLARY - Abnormal; Notable for the following components:  ? Glucose-Capillary 130 (*)   ? All other components within normal limits  ? ? ? ?IMAGES: ? ? ?EKG: ?04/18/2021 ?Rate 64 bpm  ?Sinus rhythm with 1st degree AV block  ?Low voltage QRS ?Cannot rule out anterior infarct, age undetermined ? ?CV: ?Myocardial Perfusion  05/10/2020 ?Nuclear stress EF: 66%. ?The left ventricular ejection fraction is hyperdynamic (>65%). ?There was no ST segment deviation noted during stress. ?The study is normal. ?This is a low risk study. ?  ?No ischemia or infarction on perfusion images. Mild perfusion defect at segment 17 apical cap - likely attenuation artifact. Normal wall motion.  ? ?Echo 12/18/19 ?1. Left ventricular ejection fraction, by estimation, is 65 to 70%. The  ?left ventricle has normal function. The left ventricle has no regional  ?wall motion abnormalities. There is mild left ventricular hypertrophy.  ?Left ventricular diastolic parameters  ?are indeterminate. Elevated left atrial pressure.  ? 2. Right ventricular systolic function is normal. The right ventricular  ?size is normal.  ? 3. A small pericardial effusion is present. There is no evidence of  ?cardiac tamponade.  ? 4. The mitral valve is degenerative. Trivial mitral valve regurgitation.  ?No evidence of mitral stenosis.  ? 5. The aortic valve has an indeterminant number of cusps. Aortic valve  ?regurgitation is not visualized. No aortic stenosis is present.  ? 6. The inferior vena cava is normal in size with greater than 50%  ?respiratory variability, suggesting right atrial pressure of 3 mmHg. ?Past Medical History:  ?Diagnosis Date  ? Allergy   ? Anemia due to GI blood loss 08/12/2011  ? Arthritis   ?  Atrial flutter ([place]) 12/07/2010  ? converted in ED with 300 mg flecainide  ? CAD (coronary artery disease)   ? a. mild per cath in 2004;  b. nonischemic Myoview in March 2012;  c. Lex MV 1/14:  EF 66%, no ischemia  ? Cataract   ? Chronic anticoagulation - Xarelto started 07/07/2015, CHADS2CVASC=5 07/07/2015  ? Clotting disorder ([place])   ? Diastolic CHF ([place]) 79/04/8331  ? Dysrhythmia   ? External hemorrhoids 06/07/2010  ? Family history of adverse reaction to anesthesia   ? Daughter has severe N&V  ? Gastric antral vascular ectasia   ? source for gi bleed in 07/2011 - Xarelto  stopped  ? Gastritis   ? GERD (gastroesophageal reflux disease)   ? Gout   ? Hiatal hernia   ? Hyperlipidemia   ? Hypertension   ? Hypothyroidism   ? Neuromuscular disorder ([place])   ? Obesity   ? Paroxysmal atrial fibrillation (H[facility])   ? Personal history of colonic polyps 06/06/2009  ? cecal polyp  ? Rheumatoid arthritis ([place])   ? Shingles 2023  ? Sleep apnea   ? does not use CPAP  ? Type 2 diabetes mellitus with diabetic chronic kidney disease ([place]) 04/25/2006  ?    ? Ulcer   ? ? ?Past Surgical History:  ?Procedure Laterality Date  ? BREAST BIOPSY Left   ? BREAST EXCISIONAL BIOPSY Left   ? benign  ? CARDIAC CATHETERIZATION  1999&2004  ? CARPAL TUNNEL RELEASE Bilateral 2003  ? CATARACT EXTRACTION Bilateral   ? COLONOSCOPY    ? ESOPHAGOGASTRODUODENOSCOPY  08/13/2011  ? Procedure: ESOPHAGOGASTRODUODENOSCOPY (EGD);  Surgeon: Gatha Mayer, MD;  Location: [facility] ENDOSCOPY;  Service: Endoscopy;  Laterality: N/A;  ? implantable loop recorder placement  07/20/2020  ? Medtronic Reveal Linq model LNQ 22 ([place] [id number]) implantable loop recorder by Dr Rayann Heman  ? POLYPECTOMY    ? ? ?MEDICATIONS: ? acetaminophen (TYLENOL) 650 MG CR tablet  ? apixaban (ELIQUIS) 5 MG TABS tablet  ? Apoaequorin (PREVAGEN PO)  ? blood glucose meter kit and supplies  ? cetirizine (ZYRTEC) 10 MG tablet  ? Cholecalciferol (VITAMIN D3) 2000 units TABS  ? Coenzyme Q10 (COQ10) 100 MG CAPS  ? colchicine 0.6 MG tablet  ? diclofenac Sodium (VOLTAREN) 1 % GEL  ? diltiazem (CARDIZEM CD) 360 MG 24 hr capsule  ? diltiazem (CARDIZEM) 30 MG tablet  ? DULoxetine (CYMBALTA) 30 MG capsule  ? empagliflozin (JARDIANCE) 10 MG TABS tablet  ? ferrous sulfate 325 (65 FE) MG EC tablet  ? glucose blood (ONETOUCH ULTRA) test strip  ? Insulin Pen Needle 31G X 8 MM MISC  ? Lancets (ONETOUCH ULTRASOFT) lancets  ? levothyroxine (SYNTHROID) 112 MCG tablet  ? meclizine (ANTIVERT) 25 MG tablet  ? metFORMIN (GLUCOPHAGE-XR) 500 MG 24 hr tablet  ? metoprolol succinate (TOPROL-XL) 100 MG 24  hr tablet  ? Multiple Vitamin (MULTI-VITAMINS) TABS  ? nitroGLYCERIN (NITROSTAT) 0.4 MG SL tablet  ? omeprazole (PRILOSEC) 40 MG capsule  ? ONETOUCH ULTRA test strip  ? rosuvastatin (CRESTOR) 10 MG tablet  ? Semaglutide, 1 MG/DOSE, 2 MG/1.5ML SOPN  ? triamcinolone cream (KENALOG) 0.1 %  ? ?No current facility-administered medications for this encounter.  ? ? ?Konrad Felix Ward, PA-C ?[facility] ?[phone] ? ? ? ? ? ? ?

## 2021-05-01 NOTE — Progress Notes (Signed)
[facility] Summary Report / Loop Recorder 

## 2021-05-02 ENCOUNTER — Ambulatory Visit: Payer: Medicare Other | Admitting: Gastroenterology

## 2021-05-02 ENCOUNTER — Encounter: Payer: Self-pay | Admitting: Gastroenterology

## 2021-05-02 VITALS — BP 140/72 | HR 64 | Ht 64.0 in | Wt 175.0 lb

## 2021-05-02 DIAGNOSIS — K811 Chronic cholecystitis: Secondary | ICD-10-CM

## 2021-05-02 DIAGNOSIS — R1011 Right upper quadrant pain: Secondary | ICD-10-CM

## 2021-05-02 NOTE — Patient Instructions (Addendum)
Follow up in 3 months ? ?If you are age 81 or older, your body mass index should be between 23-30. Your Body mass index is 30.04 kg/m?Marland Kitchen If this is out of the aforementioned range listed, please consider follow up with your Primary Care Provider. ? ?If you are age 41 or younger, your body mass index should be between 19-25. Your Body mass index is 30.04 kg/m?Marland Kitchen If this is out of the aformentioned range listed, please consider follow up with your Primary Care Provider.  ? ?________________________________________________________ ? ?The [facility] providers would like to encourage you to use [facility] to communicate with providers for non-urgent requests or questions.  Due to long hold times on the telephone, sending your provider a message by [facility] may be a faster and more efficient way to get a response.  Please allow 48 business hours for a response.  Please remember that this is for non-urgent requests.  ?_______________________________________________________  ? ?I appreciate the  opportunity to care for you ? ?Thank You  ? ?Harl Bowie , MD  ?

## 2021-05-02 NOTE — Progress Notes (Unsigned)
° °       ° °Courtney Pena    [id number]    05/30/1940 ° °Primary Care Physician:Hensel, William A, MD ° °Referring Physician: Hensel, William A, MD °[address] °[place],  [place] [address] ° ° °Chief complaint:  *** ° °HPI: ° °*** ° ° °Outpatient Encounter Medications as of 05/02/2021  °Medication Sig  ° acetaminophen (TYLENOL) 650 MG CR tablet Take 650 mg by mouth every 8 (eight) hours as needed for pain.  ° apixaban (ELIQUIS) 5 MG TABS tablet Take 1 tablet (5 mg total) by mouth 2 (two) times daily.  ° Apoaequorin (PREVAGEN PO) Take 1 tablet by mouth daily.  ° blood glucose meter kit and supplies Dispense based on patient and insurance preference. Use up to four times daily as directed. (FOR ICD-10 E10.9, E11.9).  ° cetirizine (ZYRTEC) 10 MG tablet Take 10 mg by mouth daily as needed for allergies.  ° Cholecalciferol (VITAMIN D3) 2000 units TABS Take 4,000 Units by mouth daily.  ° Coenzyme Q10 (COQ10) 100 MG CAPS Take 100 mEq by mouth daily.   ° colchicine 0.6 MG tablet Take 1 tablet (0.6 mg total) by mouth as needed.  ° diclofenac Sodium (VOLTAREN) 1 % GEL Apply 2 g topically as needed (pain).  ° diltiazem (CARDIZEM CD) 360 MG 24 hr capsule TAKE 1 CAPSULE BY MOUTH  DAILY  ° diltiazem (CARDIZEM) 30 MG tablet Take 1 tablet (30 mg total) by mouth every 4 (four) hours as needed (elevated HR as long as BP > 100).  ° DULoxetine (CYMBALTA) 30 MG capsule TAKE 1 CAPSULE BY MOUTH EVERY DAY  ° empagliflozin (JARDIANCE) 10 MG TABS tablet Take 1 tablet (10 mg total) by mouth daily.  ° ferrous sulfate 325 (65 FE) MG EC tablet Take 650 mg by mouth daily.   ° glucose blood (ONETOUCH ULTRA) test strip 1 Container by Other route every morning.  ° Insulin Pen Needle 31G X 8 MM MISC Use with pen to inject once daily.  ° Lancets (ONETOUCH ULTRASOFT) lancets Use to check blood sugar daily.  ° levothyroxine (SYNTHROID) 112 MCG tablet TAKE 1 TABLET BY MOUTH DAILY  BEFORE BREAKFAST  ° meclizine (ANTIVERT) 25 MG  tablet TAKE 1 TABLET (25 MG TOTAL) BY MOUTH AS NEEDED.  ° metFORMIN (GLUCOPHAGE-XR) 500 MG 24 hr tablet TAKE 4 TABLETS BY MOUTH  DAILY (Patient taking differently: Take 2,000 mg by mouth every evening.)  ° metoprolol succinate (TOPROL-XL) 100 MG 24 hr tablet TAKE 1 TABLET BY MOUTH  TWICE DAILY WITH OR  IMMEDIATELY FOLLOWING MEALS  ° Multiple Vitamin (MULTI-VITAMINS) TABS Take 1 tablet by mouth daily.  ° nitroGLYCERIN (NITROSTAT) 0.4 MG SL tablet Place 1 tablet (0.4 mg total) under the tongue every 5 (five) minutes as needed for chest pain (Call 911 if chest pain after three doses).  ° omeprazole (PRILOSEC) 40 MG capsule TAKE 1 CAPSULE BY MOUTH  DAILY  ° ONETOUCH ULTRA test strip USE TO TEST BLOOD SUGAR 3  TIMES DAILY  ° rosuvastatin (CRESTOR) 10 MG tablet TAKE 1 TABLET BY MOUTH  DAILY  ° Semaglutide, 1 MG/DOSE, 2 MG/1.5ML SOPN Inject 1 mg into the skin once a week. Patient taking 0.5mg (by reducing clicks)  ° triamcinolone cream (KENALOG) 0.1 % Apply 1 application topically as needed (dermatitis).  ° °No facility-administered encounter medications on file as of 05/02/2021.  ° ° °Allergies as of 05/02/2021 - Review Complete 05/02/2021  °Allergen Reaction Noted  ° Actos [pioglitazone] Swelling 01/07/2006  °   Actos [pioglitazone] Swelling 01/07/2006   Lisinopril Swelling 01/07/2006   Nsaids Other (See Comments) 08/12/2011   Penicillins Shortness Of Breath and Swelling 12/26/2010   Sulfamethoxazole Hives and Itching 01/07/2006   Flecainide Other (See Comments) 10/13/2015   Hctz [hydrochlorothiazide] Other (See Comments) 02/04/2019   Iodinated contrast media Itching 12/29/2015   Statins Other (See Comments) 01/31/2017   Gabapentin Other (See Comments) 06/23/2015   Losartan potassium Other (See Comments) 10/13/2015   Zetia [ezetimibe] Other (See Comments) 09/19/2017    Past Medical History:  Diagnosis Date   Allergy    Anemia due to GI blood loss 08/12/2011   Arthritis    Atrial flutter ([place]) 12/07/2010   converted in ED with 300 mg flecainide    CAD (coronary artery disease)    a. mild per cath in 2004;  b. nonischemic Myoview in March 2012;  c. Lex MV 1/14:  EF 66%, no ischemia   Cataract    Chronic anticoagulation - Xarelto started 07/07/2015, CHADS2CVASC=5 07/07/2015   Clotting disorder ([place])    Diastolic CHF ([place]) 73/22/0254   Dysrhythmia    External hemorrhoids 06/07/2010   Family history of adverse reaction to anesthesia    Daughter has severe N&V   Gastric antral vascular ectasia    source for gi bleed in 07/2011 - Xarelto stopped   Gastritis    GERD (gastroesophageal reflux disease)    Gout    Hiatal hernia    Hyperlipidemia    Hypertension    Hypothyroidism    Neuromuscular disorder ([place])    Obesity    Paroxysmal atrial fibrillation ([place])    Personal history of colonic polyps 06/06/2009   cecal polyp   Rheumatoid arthritis ([place])    Shingles 2023   Sleep apnea    does not use CPAP   Type 2 diabetes mellitus with diabetic chronic kidney disease ([place]) 04/25/2006       Ulcer     Past Surgical History:  Procedure Laterality Date   BREAST BIOPSY Left    BREAST EXCISIONAL BIOPSY Left    benign   CARDIAC CATHETERIZATION  1999&2004   CARPAL TUNNEL RELEASE Bilateral 2003   CATARACT EXTRACTION Bilateral    COLONOSCOPY     ESOPHAGOGASTRODUODENOSCOPY  08/13/2011   Procedure: ESOPHAGOGASTRODUODENOSCOPY (EGD);  Surgeon: Gatha Mayer, MD;  Location: [facility] ENDOSCOPY;  Service: Endoscopy;  Laterality: N/A;   implantable loop recorder placement  07/20/2020   Medtronic Reveal Linq model LNQ 22 ([place] [id number]) implantable loop recorder by Dr Rayann Heman   POLYPECTOMY      Family History  Problem Relation Age of Onset   Heart disease Father    Diabetes Maternal Grandfather    Hypertension Mother    Other Mother        brain tumor-benign   Diabetes Daughter        pre-diabeties   Colon polyps Daughter    Thyroid disease Daughter        x 2   Colon cancer Neg Hx    Esophageal cancer Neg Hx    Stomach cancer Neg Hx     Rectal cancer Neg Hx     Social History   Socioeconomic History   Marital status: Widowed    Spouse name: Iona Beard   Number of children: 4   Years of education: some colle   Highest education level: Not on file  Occupational History   Occupation: Management    Employer: UNEMPLOYED  Tobacco Use   Smoking status: Former  Packs/day: 2.00    Years: 30.00    Pack years: 60.00    Types: Cigarettes    Quit date: 02/26/1993    Years since quitting: 28.1   Smokeless tobacco: Never  Vaping Use   Vaping Use: Never used  Substance and Sexual Activity   Alcohol use: No    Alcohol/week: 0.0 standard drinks   Drug use: No   Sexual activity: Not Currently  Other Topics Concern   Not on file  Social History Narrative   Not on file   Social Determinants of Health   Financial Resource Strain: Not on file  Food Insecurity: Not on file  Transportation Needs: Not on file  Physical Activity: Not on file  Stress: Not on file  Social Connections: Not on file  Intimate Partner Violence: Not on file      Review of systems: All other review of systems negative except as mentioned in the HPI.   Physical Exam: Vitals:   05/02/21 1009  BP: 140/72  Pulse: 64   Body mass index is 30.04 kg/m. Gen:      No acute distress HEENT:  sclera anicteric Abd:      soft, non-tender; no palpable masses, no distension Ext:    No edema Neuro: alert and oriented x 3 Psych: normal mood and affect  Data Reviewed:  Reviewed labs, radiology imaging, old records and pertinent past GI work up   Assessment and Plan/Recommendations:  ***  This visit required *** minutes of patient care (this includes precharting, chart review, review of results, face-to-face time used for counseling as well as treatment plan and follow-up. The patient was provided an opportunity to ask questions and all were answered. The patient agreed with the plan and demonstrated an understanding of the instructions.  Damaris Hippo , MD    CC: [name], MD

## 2021-05-10 ENCOUNTER — Encounter: Payer: Self-pay | Admitting: Gastroenterology

## 2021-05-19 ENCOUNTER — Ambulatory Visit: Payer: Self-pay | Admitting: Surgery

## 2021-05-21 ENCOUNTER — Encounter (HOSPITAL_COMMUNITY): Payer: Self-pay | Admitting: Surgery

## 2021-05-21 NOTE — Anesthesia Preprocedure Evaluation (Addendum)
Anesthesia Evaluation  ? ? ?History of Anesthesia Complications ?(+) Family history of anesthesia reaction ? ?Airway ? ? ? ? ? ? ? Dental ?  ?Pulmonary ?sleep apnea , former smoker,  ?  ? ? ? ? ? ? ? Cardiovascular ?hypertension, Pt. on medications and Pt. on home beta blockers ?+ CAD and +CHF  ?+ dysrhythmias Atrial Fibrillation  ? ?EKG 04/18/21 ?NSR, 1st deg AV block, poor R wave progression in V leads ? ?Echo 12/18/19 ?1. Left ventricular ejection fraction, by estimation, is 65 to 70%. The left ventricle has normal function. The left ventricle has no regional wall motion abnormalities. There is mild left ventricular hypertrophy. Left ventricular diastolic parameters are indeterminate. Elevated left atrial pressure.  ??2. Right ventricular systolic function is normal. The right ventricular size is normal.  ??3. A small pericardial effusion is present. There is no evidence of cardiac tamponade.  ??4. The mitral valve is degenerative. Trivial mitral valve regurgitation. No evidence of mitral stenosis.  ??5. The aortic valve has an indeterminant number of cusps. Aortic valve regurgitation is not visualized. No aortic stenosis is present.  ??6. The inferior vena cava is normal in size with greater than 50% respiratory variability, suggesting right atrial pressure of 3 mmHg.  ?  ?Neuro/Psych ? Headaches,  Neuromuscular disease CVA, Residual Symptoms negative psych ROS  ? GI/Hepatic ?Neg liver ROS, hiatal hernia, GERD  Medicated,Biliary dyskinesia ?RUQ pain ?  ?Endo/Other  ?diabetes, Well Controlled, Type 2Hypothyroidism Obesity ? Renal/GU ?Renal disease  ?negative genitourinary ?  ?Musculoskeletal ? ?(+) Arthritis , Rheumatoid disorders,  Right abdominal wall bulge probably ventral hernia  ? Abdominal ?  ?Peds ? Hematology ? ?(+) Blood dyscrasia, anemia , Eliquis therapy   ?Anesthesia Other Findings ? ? Reproductive/Obstetrics ? ?  ? ? ? ? ? ? ? ? ? ? ? ? ? ?  ?   ? ? ? ? ? ? ? ? ?Anesthesia Physical ?Anesthesia Plan ? ?ASA: 3 ? ?Anesthesia Plan: General  ? ?Post-op Pain Management: Tylenol PO (pre-op)*  ? ?Induction: Intravenous, Cricoid pressure planned and Rapid sequence ? ?PONV Risk Score and Plan: 4 or greater and Treatment may vary due to age or medical condition and Ondansetron ? ?Airway Management Planned: Oral ETT ? ?Additional Equipment: None ? ?Intra-op Plan:  ? ?Post-operative Plan: Extubation in OR ? ?Informed Consent:  ? ?Plan Discussed with:  ? ?Anesthesia Plan Comments: (Patient took Eliquis last night. Case cancelled per Dr. Lanny Hurst)  ? ? ? ? ? ?Anesthesia Quick Evaluation ? ?

## 2021-05-22 ENCOUNTER — Ambulatory Visit (HOSPITAL_COMMUNITY)
Admission: RE | Admit: 2021-05-22 | Discharge: 2021-05-22 | Disposition: A | Discharge status: Home / Self Care | Payer: Medicare Other | Source: Ambulatory Visit | Attending: Surgery | Admitting: Surgery

## 2021-05-22 ENCOUNTER — Encounter (HOSPITAL_COMMUNITY)
Admission: RE | Disposition: A | Discharge status: Home / Self Care | Payer: Self-pay | Source: Ambulatory Visit | Attending: Surgery

## 2021-05-22 ENCOUNTER — Encounter (HOSPITAL_COMMUNITY): Payer: Self-pay | Admitting: Surgery

## 2021-05-22 DIAGNOSIS — Z538 Procedure and treatment not carried out for other reasons: Secondary | ICD-10-CM | POA: Diagnosis not present

## 2021-05-22 DIAGNOSIS — K828 Other specified diseases of gallbladder: Secondary | ICD-10-CM | POA: Diagnosis present

## 2021-05-22 DIAGNOSIS — I2510 Atherosclerotic heart disease of native coronary artery without angina pectoris: Secondary | ICD-10-CM

## 2021-05-22 DIAGNOSIS — Z01818 Encounter for other preprocedural examination: Secondary | ICD-10-CM

## 2021-05-22 DIAGNOSIS — E119 Type 2 diabetes mellitus without complications: Secondary | ICD-10-CM

## 2021-05-22 LAB — GLUCOSE, CAPILLARY: Glucose-Capillary: 161 mg/dL — ABNORMAL HIGH (ref 70–99)

## 2021-05-22 SURGERY — LAPAROSCOPIC CHOLECYSTECTOMY
Anesthesia: General | Laterality: Right

## 2021-05-22 MED ORDER — LIDOCAINE HCL (PF) 2 % IJ SOLN
INTRAMUSCULAR | Status: AC
  Filled 2021-05-22: qty 5

## 2021-05-22 MED ORDER — CHLORHEXIDINE GLUCONATE CLOTH 2 % EX PADS: 6.0000 | MEDICATED_PAD | Freq: Once | CUTANEOUS | Status: DC

## 2021-05-22 MED ORDER — LACTATED RINGERS IV SOLN: INTRAVENOUS | Status: DC

## 2021-05-22 MED ORDER — PROPOFOL 10 MG/ML IV BOLUS
INTRAVENOUS | Status: AC
  Filled 2021-05-22: qty 20

## 2021-05-22 MED ORDER — CHLORHEXIDINE GLUCONATE CLOTH 2 % EX PADS
6.0000 | MEDICATED_PAD | Freq: Once | CUTANEOUS | Status: DC
Start: 2021-05-22 — End: 2021-05-22

## 2021-05-22 MED ORDER — CIPROFLOXACIN IN D5W 400 MG/200ML IV SOLN
400.0000 mg | INTRAVENOUS | Status: DC
  Filled 2021-05-22: qty 200

## 2021-05-22 MED ORDER — FENTANYL CITRATE (PF) 100 MCG/2ML IJ SOLN
INTRAMUSCULAR | Status: AC
  Filled 2021-05-22: qty 2

## 2021-05-22 MED ORDER — ONDANSETRON HCL 4 MG/2ML IJ SOLN
INTRAMUSCULAR | Status: AC
  Filled 2021-05-22: qty 2

## 2021-05-22 MED ORDER — DEXAMETHASONE SODIUM PHOSPHATE 10 MG/ML IJ SOLN
INTRAMUSCULAR | Status: AC
  Filled 2021-05-22: qty 1

## 2021-05-22 MED ORDER — METRONIDAZOLE 500 MG/100ML IV SOLN
500.0000 mg | INTRAVENOUS | Status: DC
  Filled 2021-05-22: qty 100

## 2021-05-22 MED ORDER — CHLORHEXIDINE GLUCONATE 0.12 % MT SOLN: 15.0000 mL | Freq: Once | OROMUCOSAL | Status: DC

## 2021-05-22 MED ORDER — ROCURONIUM BROMIDE 10 MG/ML (PF) SYRINGE
PREFILLED_SYRINGE | INTRAVENOUS | Status: AC
  Filled 2021-05-22: qty 10

## 2021-05-22 MED ORDER — ACETAMINOPHEN 500 MG PO TABS
1000.0000 mg | ORAL_TABLET | ORAL | Status: DC
  Filled 2021-05-22: qty 2

## 2021-05-22 MED ORDER — ORAL CARE MOUTH RINSE: 15.0000 mL | Freq: Once | OROMUCOSAL | Status: DC

## 2021-05-22 SURGICAL SUPPLY — 42 items
APPLIER CLIP 5 13 M/L LIGAMAX5 (MISCELLANEOUS) ×3
BAG RETRIEVAL 10 (BASKET)
CABLE HIGH FREQUENCY MONO STRZ (ELECTRODE) ×3 IMPLANT
CATH URETL OPEN 5X70 (CATHETERS) ×3 IMPLANT
CHLORAPREP W/TINT 26 (MISCELLANEOUS) ×3 IMPLANT
CLIP APPLIE 5 13 M/L LIGAMAX5 (MISCELLANEOUS) ×2 IMPLANT
COVER MAYO STAND STRL (DRAPES) ×3 IMPLANT
DERMABOND ADVANCED (GAUZE/BANDAGES/DRESSINGS) ×1
DERMABOND ADVANCED .7 DNX12 (GAUZE/BANDAGES/DRESSINGS) ×2 IMPLANT
DRAPE C-ARM 42X120 X-RAY (DRAPES) ×3 IMPLANT
ELECT REM PT RETURN 15FT ADLT (MISCELLANEOUS) ×3 IMPLANT
ENDOLOOP SUT PDS II  0 18 (SUTURE)
ENDOLOOP SUT PDS II 0 18 (SUTURE) IMPLANT
GAUZE 4X4 16PLY ~~LOC~~+RFID DBL (SPONGE) ×3 IMPLANT
GLOVE SRG 8 PF TXTR STRL LF DI (GLOVE) ×2 IMPLANT
GLOVE SURG ENC MOIS LTX SZ7.5 (GLOVE) ×3 IMPLANT
GLOVE SURG UNDER POLY LF SZ8 (GLOVE) ×1
GOWN STRL REUS W/TWL LRG LVL3 (GOWN DISPOSABLE) ×9 IMPLANT
GRASPER SUT TROCAR 14GX15 (MISCELLANEOUS) IMPLANT
IRRIG SUCT STRYKERFLOW 2 WTIP (MISCELLANEOUS) ×3
IRRIGATION SUCT STRKRFLW 2 WTP (MISCELLANEOUS) ×2 IMPLANT
IV CATH 14GX2 1/4 (CATHETERS) IMPLANT
KIT BASIN OR (CUSTOM PROCEDURE TRAY) ×6 IMPLANT
KIT TURNOVER KIT A (KITS) ×3 IMPLANT
NEEDLE INSUFFLATION 14GA 120MM (NEEDLE) ×3 IMPLANT
NS IRRIG 1000ML POUR BTL (IV SOLUTION) ×3 IMPLANT
PAD ARMBOARD 7.5X6 YLW CONV (MISCELLANEOUS) ×3 IMPLANT
POUCH RETRIEVAL ECOSAC 10 (ENDOMECHANICALS) ×2 IMPLANT
POUCH RETRIEVAL ECOSAC 10MM (ENDOMECHANICALS) ×1
SCISSORS LAP 5X35 DISP (ENDOMECHANICALS) ×3 IMPLANT
SET TUBE SMOKE EVAC HIGH FLOW (TUBING) ×3 IMPLANT
SPONGE T-LAP 18X18 ~~LOC~~+RFID (SPONGE) ×3 IMPLANT
STOPCOCK 4 WAY LG BORE MALE ST (IV SETS) IMPLANT
SUT MNCRL AB 4-0 PS2 18 (SUTURE) ×3 IMPLANT
SYS BAG RETRIEVAL 10MM (BASKET)
SYSTEM BAG RETRIEVAL 10MM (BASKET) IMPLANT
TOWEL OR 17X26 10 PK STRL BLUE (TOWEL DISPOSABLE) ×3 IMPLANT
TRAY LAPAROSCOPIC (CUSTOM PROCEDURE TRAY) ×3 IMPLANT
TROCAR XCEL NON-BLD 11X100MML (ENDOMECHANICALS) ×3 IMPLANT
TROCAR XCEL NON-BLD 5MMX100MML (ENDOMECHANICALS) ×9 IMPLANT
WARMER LAPAROSCOPE (MISCELLANEOUS) ×3 IMPLANT
WATER STERILE IRR 1000ML POUR (IV SOLUTION) ×3 IMPLANT

## 2021-05-22 NOTE — H&P (Signed)
[name] took her eliquis yesterday so we will reschedule surgery. ?

## 2021-05-22 NOTE — Progress Notes (Signed)
Dr. Thermon Leyland aware that patient took her eliquis yesterday morning at 0730. Per MD, surgery canceled.  ?

## 2021-05-25 NOTE — H&P (Signed)
Courtney Pena took her eliquis yesterday so we will reschedule surgery. ?

## 2021-05-28 ENCOUNTER — Other Ambulatory Visit: Payer: Self-pay | Admitting: Family Medicine

## 2021-05-29 ENCOUNTER — Ambulatory Visit (INDEPENDENT_AMBULATORY_CARE_PROVIDER_SITE_OTHER): Payer: Medicare Other

## 2021-05-29 DIAGNOSIS — Z01810 Encounter for preprocedural cardiovascular examination: Secondary | ICD-10-CM | POA: Diagnosis not present

## 2021-05-30 LAB — CUP PACEART REMOTE DEVICE CHECK
Date Time Interrogation Session: 20230331230538
Implantable Pulse Generator Implant Date: 20220525

## 2021-06-06 ENCOUNTER — Ambulatory Visit: Payer: Self-pay | Admitting: Surgery

## 2021-06-13 ENCOUNTER — Encounter: Payer: Medicare Other | Admitting: Cardiology

## 2021-06-13 NOTE — Progress Notes (Signed)
[facility] Summary Report / Loop Recorder 

## 2021-06-13 NOTE — Progress Notes (Deleted)
Electrophysiology Office Note   Date:  06/13/2021   ID:  Courtney Pena, DOB 1940-11-29, MRN [id number]  PCP:  Courtney Resides, MD  Cardiologist:  Johnsie Cancel Primary Electrophysiologist:  Rena Hunke Meredith Leeds, MD    Chief Complaint: AF   History of Present Illness: Courtney Pena is a 81 y.o. female who is being seen today for the evaluation of AF at the request of Hensel, Jamal Collin, MD. Presenting today for electrophysiology evaluation.  She has a history significant for diabetes, hypertension, hyperlipidemia, obesity, atrial flutter.  She was initially off of anticoagulation due to GI bleed.  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  She has since been started on flecainide.  After starting flecainide, she had blurry vision.  Flecainide was stopped and her visual changes improved.  Today, she denies*** symptoms of palpitations, chest pain, shortness of breath, orthopnea, PND, lower extremity edema, claudication, dizziness, presyncope, syncope, bleeding, or neurologic sequela. The patient is tolerating medications without difficulties.    Past Medical History:  Diagnosis Date   Allergy    Anemia due to GI blood loss 08/12/2011   Arthritis    Atrial flutter ([place]) 12/07/2010   converted in ED with 300 mg flecainide   CAD (coronary artery disease)    a. mild per cath in 2004;  b. nonischemic Myoview in March 2012;  c. Lex MV 1/14:  EF 66%, no ischemia   Cataract    Chronic anticoagulation - Xarelto started 07/07/2015, CHADS2CVASC=5 07/07/2015   Clotting disorder (HCC)    Diastolic CHF ([place]) 88/82/8003   Dysrhythmia    External hemorrhoids 06/07/2010   Family history of adverse reaction to anesthesia    Daughter has severe N&V   Gastric antral vascular ectasia    source for gi bleed in 07/2011 - Xarelto stopped   Gastritis    GERD (gastroesophageal reflux disease)    Gout    Hiatal hernia    Hyperlipidemia     Hypertension    Hypothyroidism    Neuromuscular disorder ([place])    Obesity    Paroxysmal atrial fibrillation (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       Ulcer    Past Surgical History:  Procedure Laterality Date   BREAST BIOPSY Left    BREAST EXCISIONAL BIOPSY Left    benign   CARDIAC CATHETERIZATION  1999&2004   CARPAL TUNNEL RELEASE Bilateral 2003   CATARACT EXTRACTION Bilateral    COLONOSCOPY     ESOPHAGOGASTRODUODENOSCOPY  08/13/2011   Procedure: ESOPHAGOGASTRODUODENOSCOPY (EGD);  Surgeon: Gatha Mayer, MD;  Location[facility] ENDOSCOPY;  Service: Endoscopy;  Laterality: N/A;   implantable loop recorder placement  07/20/2020   Medtronic Reveal Linq model LNQ 22 ([place] [id number]) implantable loop recorder by Dr Rayann Heman   POLYPECTOMY       Current Outpatient Medications  Medication Sig Dispense Refill   acetaminophen (TYLENOL) 650 MG CR tablet Take 650 mg by mouth every 8 (eight) hours as needed for pain.     apixaban (ELIQUIS) 5 MG TABS tablet Take 1 tablet (5 mg total) by mouth 2 (two) times daily. 42 tablet 0   Apoaequorin (PREVAGEN PO) Take 1 tablet by mouth daily.  blood glucose meter kit and supplies Dispense based on patient and insurance preference. Use up to four times daily as directed. (FOR ICD-10 E10.9, E11.9). 1 each 0   cetirizine (ZYRTEC) 10 MG tablet Take 10 mg by mouth daily as needed for allergies.     Cholecalciferol (VITAMIN D3) 2000 units TABS Take 4,000 Units by mouth daily.     Coenzyme Q10 (COQ10) 100 MG CAPS Take 100 mEq by mouth daily.      colchicine 0.6 MG tablet Take 1 tablet (0.6 mg total) by mouth as needed. 30 tablet 6   diclofenac Sodium (VOLTAREN) 1 % GEL Apply 2 g topically as needed (pain). 350 g 3   diltiazem (CARDIZEM CD) 360 MG 24 hr capsule TAKE 1  CAPSULE BY MOUTH  DAILY 90 capsule 3   diltiazem (CARDIZEM) 30 MG tablet Take 1 tablet (30 mg total) by mouth every 4 (four) hours as needed (elevated HR as long as BP > 100). 30 tablet 6   DULoxetine (CYMBALTA) 30 MG capsule TAKE 1 CAPSULE BY MOUTH EVERY DAY 90 capsule 3   empagliflozin (JARDIANCE) 10 MG TABS tablet Take 1 tablet (10 mg total) by mouth daily. 30 tablet 3   ferrous sulfate 325 (65 FE) MG EC tablet Take 650 mg by mouth daily.      glucose blood (ONETOUCH ULTRA) test strip 1 Container by Other route every morning.     Insulin Pen Needle 31G X 8 MM MISC Use with pen to inject once daily. 100 each 3   Lancets (ONETOUCH ULTRASOFT) lancets Use to check blood sugar daily. 100 each 3   levothyroxine (SYNTHROID) 112 MCG tablet TAKE 1 TABLET BY MOUTH DAILY  BEFORE BREAKFAST 90 tablet 3   meclizine (ANTIVERT) 25 MG tablet TAKE 1 TABLET (25 MG TOTAL) BY MOUTH AS NEEDED. 30 tablet 3   metFORMIN (GLUCOPHAGE-XR) 500 MG 24 hr tablet TAKE 4 TABLETS BY MOUTH  DAILY 360 tablet 3   metoprolol succinate (TOPROL-XL) 100 MG 24 hr tablet TAKE 1 TABLET BY MOUTH  TWICE DAILY WITH OR  IMMEDIATELY FOLLOWING MEALS 180 tablet 3   Multiple Vitamin (MULTI-VITAMINS) TABS Take 1 tablet by mouth daily.     nitroGLYCERIN (NITROSTAT) 0.4 MG SL tablet Place 1 tablet (0.4 mg total) under the tongue every 5 (five) minutes as needed for chest pain (Call 911 if chest pain after three doses). 25 tablet 3   omeprazole (PRILOSEC) 40 MG capsule TAKE 1 CAPSULE BY MOUTH  DAILY 90 capsule 3   ONETOUCH ULTRA test strip USE TO TEST BLOOD SUGAR 3  TIMES DAILY 300 strip 3   rosuvastatin (CRESTOR) 10 MG tablet TAKE 1 TABLET BY MOUTH  DAILY 90 tablet 3   Semaglutide, 1 MG/DOSE, 2 MG/1.5ML SOPN Inject 1 mg into the skin once a week. Patient taking 6.3KZ (by reducing clicks)     triamcinolone cream (KENALOG) 0.1 % Apply 1 application topically as needed (dermatitis).     No current facility-administered medications for this visit.     Allergies:   Actos [pioglitazone], Lisinopril, Nsaids, Penicillins, Sulfamethoxazole, Flecainide, Hctz [hydrochlorothiazide], Iodinated contrast media, Statins, Gabapentin, Losartan potassium, and Zetia [ezetimibe]   Social History:  The patient  reports that she quit smoking about 28 years ago. Her smoking use included cigarettes. She has a 60.00 pack-year smoking history. She has never used smokeless tobacco. She reports that she does not drink alcohol and does not use drugs.   Family History:  The patient's family history includes  Colon polyps in her daughter; Diabetes in her daughter and maternal grandfather; Heart disease in her father; Hypertension in her mother; Other in her mother; Thyroid disease in her daughter.    ROS:  Please see the history of present illness.   Otherwise, review of systems is positive for none.   All other systems are reviewed and negative.    PHYSICAL EXAM: VS:  There were no vitals taken for this visit. , BMI There is no height or weight on file to calculate BMI. GEN: Well nourished, well developed, in no acute distress  HEENT: normal  Neck: no JVD, carotid bruits, or masses Cardiac: ***RRR; no murmurs, rubs, or gallops,no edema  Respiratory:  clear to auscultation bilaterally, normal work of breathing GI: soft, nontender, nondistended, + BS MS: no deformity or atrophy  Skin: warm and dry, device pocket is well healed Neuro:  Strength and sensation are intact Psych: euthymic mood, full affect  EKG:  EKG {ACTION; IS/IS [id number]} ordered today. Personal review of the ekg ordered *** shows ***  Device interrogation is reviewed today in detail.  See PaceArt for details.   Recent Labs: 03/21/2021: ALT 10 04/26/2021: BUN 17; Creatinine, Ser 0.78; Hemoglobin 11.0; Platelets 264; Potassium 4.1; Sodium 137    Lipid Panel     Component Value Date/Time   CHOL 144 07/14/2020 1121   TRIG 77 07/14/2020 1121   HDL 60 07/14/2020 1121   CHOLHDL 2.4  07/14/2020 1121   CHOLHDL 4.7 01/27/2015 1238   VLDL 28 01/27/2015 1238   LDLCALC 69 07/14/2020 1121   LDLDIRECT 62 11/24/2018 1211   LDLDIRECT 178 (H) 05/12/2015 1229     Wt Readings from Last 3 Encounters:  05/22/21 175 lb (79.4 kg)  05/02/21 175 lb (79.4 kg)  04/26/21 170 lb 9.6 oz (77.4 kg)      Other studies Reviewed: Additional studies/ records that were reviewed today include: TTE 12/18/19  Review of the above records today demonstrates:   1. Left ventricular ejection fraction, by estimation, is 65 to 70%. The  left ventricle has normal function. The left ventricle has no regional  wall motion abnormalities. There is mild left ventricular hypertrophy.  Left ventricular diastolic parameters  are indeterminate. Elevated left atrial pressure.   2. Right ventricular systolic function is normal. The right ventricular  size is normal.   3. A small pericardial effusion is present. There is no evidence of  cardiac tamponade.   4. The mitral valve is degenerative. Trivial mitral valve regurgitation.  No evidence of mitral stenosis.   5. The aortic valve has an indeterminant number of cusps. Aortic valve  regurgitation is not visualized. No aortic stenosis is present.   6. The inferior vena cava is normal in size with greater than 50%  respiratory variability, suggesting right atrial pressure of 3 mmHg.    ASSESSMENT AND PLAN:  1.  Accessible atrial fibrillation/flutter: CHA2DS2-VASc of 5.  Currently on Eliquis 5 mg twice daily.  This post Linq monitor implant.  Was initially on flecainide but had blurry vision and thus this was stopped.***  2.  Hypertension:***    Current medicines are reviewed at length with the patient today.   The patient {ACTIONS; HAS/DOES NOT HAVE:19233} concerns regarding her medicines.  The following changes were made today:  {NONE DEFAULTED:18576}  Labs/ tests ordered today include: *** No orders of the defined types were placed in this  encounter.    Disposition:   FU with Marina Desire {gen number [id number]} {Days  to years:10300}  Signed, Will Meredith Leeds, MD  06/13/2021 10:01 AM     [facility] [address] [place] [address] [phone] (office) [phone] (fax)

## 2021-06-16 ENCOUNTER — Encounter (HOSPITAL_COMMUNITY): Payer: Medicare Other

## 2021-06-27 ENCOUNTER — Encounter (HOSPITAL_COMMUNITY): Payer: Self-pay

## 2021-06-27 ENCOUNTER — Telehealth: Payer: Self-pay

## 2021-06-27 NOTE — Patient Instructions (Signed)
DUE TO COVID-19 ONLY TWO VISITORS  (aged 81 and older)  ARE ALLOWED TO COME WITH YOU AND STAY IN THE WAITING ROOM ONLY DURING PRE OP AND PROCEDURE.   ?**NO VISITORS ARE ALLOWED IN THE SHORT STAY AREA OR RECOVERY ROOM!!** ? ?IF YOU WILL BE ADMITTED INTO THE HOSPITAL YOU ARE ALLOWED ONLY FOUR SUPPORT PEOPLE DURING VISITATION HOURS ONLY (7 AM -8PM)   ?The support person(s) must pass our screening, gel in and out, and wear a mask at all times, including in the patient?s room. ?Patients must also wear a mask when staff or their support person are in the room. ?Visitors GUEST BADGE MUST BE WORN VISIBLY  ?One adult visitor may remain with you overnight and MUST be in the room by 8 P.M. ?  ? ? Your procedure is scheduled on: 07/05/21 ? ? Report to [facility] Main Entrance ? ?  Report to admitting at  6:15 AM ? ? Call this number if you have problems the morning of surgery [phone] ? ? Do not eat food :After Midnight. ? ? After Midnight you may have the following liquids until __5:30 AM/  DAY OF SURGERY ? ?Water ?Black Coffee (sugar ok, NO MILK/CREAM OR CREAMERS)  ?Tea (sugar ok, NO MILK/CREAM OR CREAMERS) regular and decaf                             ?Plain Jell-O (NO RED)                                           ?Fruit ices (not with fruit pulp, NO RED)                                     ?Popsicles (NO RED)                                                                  ?Juice: apple, WHITE grape, WHITE cranberry ?Sports drinks like Gatorade (NO RED) ?Clear broth(vegetable,chicken,beef) ? ?             ? ?  ?  ?The day of surgery:  ?Drink ONE (1)  G2 at  5:15 AM the morning of surgery. Drink in one sitting. Do not sip.  ?This drink was given to you during your hospital  ?pre-op appointment visit. ?Nothing else to drink after completing the  ?G2.at 5:30 AM ?  ?       If you have questions, please contact your surgeon?s office. ? ? ?Oral Hygiene is also important to reduce your risk of infection.                                     ?Remember - BRUSH YOUR TEETH THE MORNING OF SURGERY WITH YOUR REGULAR TOOTHPASTE ? ? ? Take these medicines the morning of surgery with A SIP OF WATER: Metoprolol, Diltiazem, Levothyroxine, Omeprazole ? ? Before surgery.Stop taking __Eliquis_________on __5/6________as instructed by _____________. ? ?  Stop taking ____________as directed by your Surgeon/Cardiologist. ? ?Contact your Surgeon/Cardiologist for instructions on Anticoagulant Therapy prior to surgery. ? ?            Do not take your jardiance the day before surgery. ?  ?DO NOT TAKE ANY ORAL DIABETIC MEDICATIONS DAY OF YOUR SURGERY (Metformin) ? ?Bring CPAP mask and tubing day of surgery. ?                  ?           You may not have any metal on your body including hair pins, jewelry, and body piercing ? ?           Do not wear make-up, lotions, powders, perfumes/cologne, or deodorant ? ?Do not wear nail polish including gel and S&S, artificial/acrylic nails, or any other type of covering on natural nails including finger and toenails. If you have artificial nails, gel coating, etc. that needs to be removed by a nail salon please have this removed prior to surgery or surgery may need to be canceled/ delayed if the surgeon/ anesthesia feels like they are unable to be safely monitored.  ? ?Do not shave  48 hours prior to surgery.  ? ?            ? ? Do not bring valuables to the hospital. [facility] NOT ?            RESPONSIBLE   FOR VALUABLES. ? ? Contacts, dentures or bridgework may not be worn into surgery. ? ? . ?  ? Patients discharged on the day of surgery will not be allowed to drive home.  Someone NEEDS to stay with you for the first 24 hours after anesthesia. ? ? Special Instructions: Bring a copy of your healthcare power of attorney and living will documents  the day of surgery if you haven't scanned them before. ? ?            Please read over the following fact sheets you were given: IF [place] [phone] ? ?   [place] - Preparing for Surgery ?Before surgery, you can play an important role.  Because skin is not sterile, your skin needs to be as free of germs as possible.  You can reduce the number of germs on your skin by washing with CHG (chlorahexidine gluconate) soap before surgery.  CHG is an antiseptic cleaner which kills germs and bonds with the skin to continue killing germs even after washing. ?Please DO NOT use if you have an allergy to CHG or antibacterial soaps.  If your skin becomes reddened/irritated stop using the CHG and inform your nurse when you arrive at Short Stay. ?Do not shave (including legs and underarms) for at least 48 hours prior to the first CHG shower.   ?Please follow these instructions carefully: ? 1.  Shower with CHG Soap the night before surgery and the  morning of Surgery. ? 2.  If you choose to wash your hair, wash your hair first as usual with your  normal  shampoo. ? 3.  After you shampoo, rinse your hair and body thoroughly to remove the  shampoo.                       ?     4.  Use CHG as you would any other liquid soap.  You can apply chg directly  to the skin and wash  ?                     Gently with a scrungie or clean washcloth. ? 5.  Apply the CHG Soap to your body ONLY FROM THE NECK DOWN.   Do not use on face/ open      ?                     Wound or open sores. Avoid contact with eyes, ears mouth and genitals (private parts).  ?                     Production manager,  Genitals (private parts) with your normal soap. ?            6.  Wash thoroughly, paying special attention to the area where your surgery  will be performed. ? 7.  Thoroughly rinse your body with warm water from the neck down. ? 8.  DO NOT shower/wash with your normal soap after using and rinsing off  the CHG Soap. ?               9.  Pat yourself dry with a clean towel. ?           10.  Wear clean pajamas. ?           11.  Place clean sheets on your bed the night of  your first shower and do not  sleep with pets. ?Day of Surgery : ?Do not apply any lotions/deodorants the morning of surgery.  Please wear clean clothes to the hospital/surgery center. ? ?FAILURE TO FOLLOW THESE INSTRUCTIONS MAY RESULT IN THE CANCELLATION OF YOUR SURGERY ? ? ?________________________________________________________________________  ? ?Incentive Spirometer ? ?An incentive spirometer is a tool that can help keep your lungs clear and active. This tool measures how well you are filling your lungs with each breath. Taking long deep breaths may help reverse or decrease the chance of developing breathing (pulmonary) problems (especially infection) following: ?A long period of time when you are unable to move or be active. ?BEFORE THE PROCEDURE  ?If the spirometer includes an indicator to show your best effort, your nurse or respiratory therapist will set it to a desired goal. ?If possible, sit up straight or lean slightly forward. Try not to slouch. ?Hold the incentive spirometer in an upright position. ?INSTRUCTIONS FOR USE  ?Sit on the edge of your bed if possible, or sit up as far as you can in bed or on a chair. ?Hold the incentive spirometer in an upright position. ?Breathe out normally. ?Place the mouthpiece in your mouth and seal your lips tightly around it. ?Breathe in slowly and as deeply as possible, raising the piston or the ball toward the top of the column. ?Hold your breath for 3-5 seconds or for as long as possible. Allow the piston or ball to fall to the bottom of the column. ?Remove the mouthpiece from your mouth and breathe out normally. ?Rest for a few seconds and repeat Steps 1 through 7 at least 10 times every 1-2 hours when you are awake. Take your time and take a few normal breaths between deep breaths. ?The spirometer may include an indicator to show your best effort. Use the indicator as a goal to work toward during each repetition. ?After each set of 10 deep breaths, practice  coughing to be sure your lungs are clear. If you have  an incision (the cut made at the time of surgery), support your incision when coughing by placing a pillow or rolled up towels firmly against it. ?Once

## 2021-06-27 NOTE — Telephone Encounter (Signed)
Informed pt her ozempic pens are ready for pickup. Pt will pick them up tomorrow (06/28/21). ? ?4 boxes of ozempic ready and labeled in med room fridge. ?

## 2021-06-28 ENCOUNTER — Encounter (HOSPITAL_COMMUNITY): Payer: Self-pay

## 2021-06-28 ENCOUNTER — Other Ambulatory Visit: Payer: Self-pay

## 2021-06-28 ENCOUNTER — Encounter (HOSPITAL_COMMUNITY)
Admission: RE | Admit: 2021-06-28 | Discharge: 2021-06-28 | Disposition: A | Discharge status: Home / Self Care | Payer: Medicare Other | Source: Ambulatory Visit | Attending: Surgery | Admitting: Surgery

## 2021-06-28 DIAGNOSIS — N189 Chronic kidney disease, unspecified: Secondary | ICD-10-CM | POA: Diagnosis not present

## 2021-06-28 DIAGNOSIS — Z01812 Encounter for preprocedural laboratory examination: Secondary | ICD-10-CM | POA: Diagnosis not present

## 2021-06-28 DIAGNOSIS — E1122 Type 2 diabetes mellitus with diabetic chronic kidney disease: Secondary | ICD-10-CM | POA: Insufficient documentation

## 2021-06-28 LAB — CBC
HCT: 35.2 % — ABNORMAL LOW (ref 36.0–46.0)
Hemoglobin: 11 g/dL — ABNORMAL LOW (ref 12.0–15.0)
MCH: 28.4 pg (ref 26.0–34.0)
MCHC: 31.3 g/dL (ref 30.0–36.0)
MCV: 90.7 fL (ref 80.0–100.0)
Platelets: 295 10*3/uL (ref 150–400)
RBC: 3.88 MIL/uL (ref 3.87–5.11)
RDW: 15.3 % (ref 11.5–15.5)
WBC: 4.7 10*3/uL (ref 4.0–10.5)
nRBC: 0 % (ref 0.0–0.2)

## 2021-06-28 LAB — BASIC METABOLIC PANEL
Anion gap: 8 (ref 5–15)
BUN: 20 mg/dL (ref 8–23)
CO2: 27 mmol/L (ref 22–32)
Calcium: 9.8 mg/dL (ref 8.9–10.3)
Chloride: 104 mmol/L (ref 98–111)
Creatinine, Ser: 1.05 mg/dL — ABNORMAL HIGH (ref 0.44–1.00)
GFR, Estimated: 53 mL/min — ABNORMAL LOW (ref >60)
Glucose, Bld: 121 mg/dL — ABNORMAL HIGH (ref 70–99)
Potassium: 4.3 mmol/L (ref 3.5–5.1)
Sodium: 139 mmol/L (ref 135–145)

## 2021-06-28 LAB — GLUCOSE, CAPILLARY: Glucose-Capillary: 125 mg/dL — ABNORMAL HIGH (ref 70–99)

## 2021-06-28 LAB — HEMOGLOBIN A1C
Hgb A1c MFr Bld: 6.7 % — ABNORMAL HIGH (ref 4.8–5.6)
Mean Plasma Glucose: 145.59 mg/dL

## 2021-06-28 NOTE — Progress Notes (Signed)
Anesthesia note: ? ?Bowel prep reminder:NA ? ?PCP - Dr. Lewayne Bunting ?Cardiologist -Dr. Johnsie Cancel ?Other- Rheumatologist Dr. Jerilynn Mages. Patel ? ?Chest x-ray - no ?EKG - 04/18/21-epic ?Stress Test - 05/10/20-epic ?ECHO - 12/18/19-epic ?Cardiac Cath -1999, 2004  ? ?Pacemaker/ICD device last checked:NA Pt has a Loop recorder ? ?Sleep Study - yes ?CPAP - no . Pt lost weight and no longer needs it ? ? ?Fasting Blood Sugar - 104-152 ?Checks Blood Sugar _3 times a week____ ? ?Blood Thinner:Eliquis for A-Fib/ Dr. Johnsie Cancel. ?Blood Thinner Instructions: stop 3 days prior to DOS ?Aspirin Instructions: ?Last Dose:07/01/21 ? ?Anesthesia review: yes ? ?Patient denies shortness of breath, fever, cough and chest pain at PAT appointment ?Pt has no SOB with any activities. ? ?Patient verbalized understanding of instructions that were given to them at the PAT appointment. Patient was also instructed that they will need to review over the PAT instructions again at home before surgery. yes ?

## 2021-06-29 NOTE — Telephone Encounter (Signed)
Medication given to patient

## 2021-07-03 ENCOUNTER — Ambulatory Visit (INDEPENDENT_AMBULATORY_CARE_PROVIDER_SITE_OTHER): Payer: Medicare Other

## 2021-07-03 DIAGNOSIS — I480 Paroxysmal atrial fibrillation: Secondary | ICD-10-CM

## 2021-07-04 NOTE — Anesthesia Preprocedure Evaluation (Addendum)
Anesthesia Evaluation  ?Patient identified by MRN, date of birth, ID band ?Patient awake ? ? ? ?Reviewed: ?Allergy & Precautions, H&P , NPO status , Patient's Chart, lab work & pertinent test results ? ?Airway ?Mallampati: II ? ?TM Distance: >3 FB ?Neck ROM: Full ? ? ? Dental ?no notable dental hx. ?(+) Edentulous Upper, Partial Lower, Dental Advisory Given ?  ?Pulmonary ?neg pulmonary ROS, former smoker,  ?  ?Pulmonary exam normal ?breath sounds clear to auscultation ? ? ? ? ? ? Cardiovascular ?Exercise Tolerance: Good ?hypertension, Pt. on medications and Pt. on home beta blockers ?+ CAD and +CHF  ?+ dysrhythmias Atrial Fibrillation  ?Rhythm:Regular Rate:Normal ? ? ?  ?Neuro/Psych ? Headaches, negative psych ROS  ? GI/Hepatic ?Neg liver ROS, GERD  Medicated,  ?Endo/Other  ?diabetes, Type 2, Oral Hypoglycemic AgentsHypothyroidism  ? Renal/GU ?Renal disease  ?negative genitourinary ?  ?Musculoskeletal ? ?(+) Arthritis ,  ? Abdominal ?  ?Peds ? Hematology ? ?(+) Blood dyscrasia, anemia ,   ?Anesthesia Other Findings ? ? Reproductive/Obstetrics ?negative OB ROS ? ?  ? ? ? ? ? ? ? ? ? ? ? ? ? ?  ?  ? ? ? ? ? ? ? ?Anesthesia Physical ?Anesthesia Plan ? ?ASA: 3 ? ?Anesthesia Plan: General  ? ?Post-op Pain Management: Tylenol PO (pre-op)*  ? ?Induction: Intravenous ? ?PONV Risk Score and Plan: 4 or greater and Ondansetron, Dexamethasone and Treatment may vary due to age or medical condition ? ?Airway Management Planned: Oral ETT ? ?Additional Equipment:  ? ?Intra-op Plan:  ? ?Post-operative Plan: Extubation in OR ? ?Informed Consent: I have reviewed the patients History and Physical, chart, labs and discussed the procedure including the risks, benefits and alternatives for the proposed anesthesia with the patient or authorized representative who has indicated his/her understanding and acceptance.  ? ? ? ?Dental advisory given ? ?Plan Discussed with: CRNA ? ?Anesthesia Plan Comments:    ? ? ? ? ? ?Anesthesia Quick Evaluation ? ?

## 2021-07-05 ENCOUNTER — Ambulatory Visit (HOSPITAL_COMMUNITY): Payer: Medicare Other | Admitting: Physician Assistant

## 2021-07-05 ENCOUNTER — Other Ambulatory Visit: Payer: Self-pay

## 2021-07-05 ENCOUNTER — Ambulatory Visit (HOSPITAL_COMMUNITY)
Admission: RE | Admit: 2021-07-05 | Discharge: 2021-07-05 | Disposition: A | Discharge status: Home / Self Care | Payer: Medicare Other | Source: Ambulatory Visit | Attending: Surgery | Admitting: Surgery

## 2021-07-05 ENCOUNTER — Encounter (HOSPITAL_COMMUNITY)
Admission: RE | Disposition: A | Discharge status: Home / Self Care | Payer: Self-pay | Source: Ambulatory Visit | Attending: Surgery

## 2021-07-05 ENCOUNTER — Ambulatory Visit (HOSPITAL_BASED_OUTPATIENT_CLINIC_OR_DEPARTMENT_OTHER): Payer: Medicare Other | Admitting: Anesthesiology

## 2021-07-05 ENCOUNTER — Encounter (HOSPITAL_COMMUNITY): Payer: Self-pay | Admitting: Surgery

## 2021-07-05 DIAGNOSIS — I110 Hypertensive heart disease with heart failure: Secondary | ICD-10-CM

## 2021-07-05 DIAGNOSIS — I2510 Atherosclerotic heart disease of native coronary artery without angina pectoris: Secondary | ICD-10-CM | POA: Insufficient documentation

## 2021-07-05 DIAGNOSIS — N189 Chronic kidney disease, unspecified: Secondary | ICD-10-CM | POA: Insufficient documentation

## 2021-07-05 DIAGNOSIS — I4891 Unspecified atrial fibrillation: Secondary | ICD-10-CM | POA: Diagnosis not present

## 2021-07-05 DIAGNOSIS — I129 Hypertensive chronic kidney disease with stage 1 through stage 4 chronic kidney disease, or unspecified chronic kidney disease: Secondary | ICD-10-CM | POA: Insufficient documentation

## 2021-07-05 DIAGNOSIS — I509 Heart failure, unspecified: Secondary | ICD-10-CM | POA: Insufficient documentation

## 2021-07-05 DIAGNOSIS — Z7984 Long term (current) use of oral hypoglycemic drugs: Secondary | ICD-10-CM | POA: Insufficient documentation

## 2021-07-05 DIAGNOSIS — Z87891 Personal history of nicotine dependence: Secondary | ICD-10-CM | POA: Diagnosis not present

## 2021-07-05 DIAGNOSIS — E119 Type 2 diabetes mellitus without complications: Secondary | ICD-10-CM | POA: Insufficient documentation

## 2021-07-05 DIAGNOSIS — E1122 Type 2 diabetes mellitus with diabetic chronic kidney disease: Secondary | ICD-10-CM | POA: Diagnosis not present

## 2021-07-05 DIAGNOSIS — Z79899 Other long term (current) drug therapy: Secondary | ICD-10-CM | POA: Insufficient documentation

## 2021-07-05 DIAGNOSIS — K8010 Calculus of gallbladder with chronic cholecystitis without obstruction: Secondary | ICD-10-CM | POA: Diagnosis not present

## 2021-07-05 DIAGNOSIS — K828 Other specified diseases of gallbladder: Secondary | ICD-10-CM | POA: Diagnosis not present

## 2021-07-05 DIAGNOSIS — B028 Zoster with other complications: Secondary | ICD-10-CM | POA: Diagnosis not present

## 2021-07-05 DIAGNOSIS — Z7901 Long term (current) use of anticoagulants: Secondary | ICD-10-CM | POA: Insufficient documentation

## 2021-07-05 DIAGNOSIS — K219 Gastro-esophageal reflux disease without esophagitis: Secondary | ICD-10-CM | POA: Insufficient documentation

## 2021-07-05 DIAGNOSIS — E039 Hypothyroidism, unspecified: Secondary | ICD-10-CM | POA: Diagnosis not present

## 2021-07-05 HISTORY — PX: CHOLECYSTECTOMY: SHX55

## 2021-07-05 LAB — GLUCOSE, CAPILLARY
Glucose-Capillary: 153 mg/dL — ABNORMAL HIGH (ref 70–99)
Glucose-Capillary: 205 mg/dL — ABNORMAL HIGH (ref 70–99)

## 2021-07-05 SURGERY — LAPAROSCOPIC CHOLECYSTECTOMY
Anesthesia: General

## 2021-07-05 MED ORDER — FENTANYL CITRATE PF 50 MCG/ML IJ SOSY
PREFILLED_SYRINGE | INTRAMUSCULAR | Status: AC
  Filled 2021-07-05: qty 1

## 2021-07-05 MED ORDER — ONDANSETRON HCL 4 MG/2ML IJ SOLN
INTRAMUSCULAR | Status: AC
  Filled 2021-07-05: qty 2

## 2021-07-05 MED ORDER — PROPOFOL 10 MG/ML IV BOLUS
INTRAVENOUS | Status: AC
  Filled 2021-07-05: qty 20

## 2021-07-05 MED ORDER — FENTANYL CITRATE (PF) 100 MCG/2ML IJ SOLN
INTRAMUSCULAR | Status: AC
  Filled 2021-07-05: qty 2

## 2021-07-05 MED ORDER — LIDOCAINE HCL (CARDIAC) PF 100 MG/5ML IV SOSY
PREFILLED_SYRINGE | INTRAVENOUS | Status: DC | PRN
  Administered 2021-07-05: 40 mg via INTRAVENOUS

## 2021-07-05 MED ORDER — METRONIDAZOLE 500 MG/100ML IV SOLN
500.0000 mg | INTRAVENOUS | Status: AC
  Administered 2021-07-05: 500 mg via INTRAVENOUS
  Filled 2021-07-05: qty 100

## 2021-07-05 MED ORDER — ACETAMINOPHEN 500 MG PO TABS
1000.0000 mg | ORAL_TABLET | ORAL | Status: AC
  Administered 2021-07-05: 1000 mg via ORAL
  Filled 2021-07-05: qty 2

## 2021-07-05 MED ORDER — FENTANYL CITRATE (PF) 100 MCG/2ML IJ SOLN
INTRAMUSCULAR | Status: DC | PRN
  Administered 2021-07-05 (×2): 50 ug via INTRAVENOUS

## 2021-07-05 MED ORDER — BUPIVACAINE-EPINEPHRINE 0.25% -1:200000 IJ SOLN
INTRAMUSCULAR | Status: DC | PRN
Start: 2021-07-05 — End: 2021-07-05
  Administered 2021-07-05: 30 mL

## 2021-07-05 MED ORDER — PROPOFOL 10 MG/ML IV BOLUS
INTRAVENOUS | Status: DC | PRN
  Administered 2021-07-05: 120 mg via INTRAVENOUS

## 2021-07-05 MED ORDER — DEXAMETHASONE SODIUM PHOSPHATE 10 MG/ML IJ SOLN
INTRAMUSCULAR | Status: AC
  Filled 2021-07-05: qty 1

## 2021-07-05 MED ORDER — LACTATED RINGERS IV SOLN: INTRAVENOUS | Status: DC

## 2021-07-05 MED ORDER — ACETAMINOPHEN 500 MG PO TABS: 1000.0000 mg | ORAL_TABLET | Freq: Once | ORAL | Status: DC

## 2021-07-05 MED ORDER — SUGAMMADEX SODIUM 500 MG/5ML IV SOLN
INTRAVENOUS | Status: DC | PRN
  Administered 2021-07-05: 200 mg via INTRAVENOUS

## 2021-07-05 MED ORDER — PHENYLEPHRINE HCL (PRESSORS) 10 MG/ML IV SOLN
INTRAVENOUS | Status: AC
  Filled 2021-07-05: qty 1

## 2021-07-05 MED ORDER — FENTANYL CITRATE PF 50 MCG/ML IJ SOSY
25.0000 ug | PREFILLED_SYRINGE | INTRAMUSCULAR | Status: DC | PRN
  Administered 2021-07-05 (×2): 50 ug via INTRAVENOUS

## 2021-07-05 MED ORDER — ONDANSETRON HCL 4 MG/2ML IJ SOLN
INTRAMUSCULAR | Status: DC | PRN
  Administered 2021-07-05: 4 mg via INTRAVENOUS

## 2021-07-05 MED ORDER — CIPROFLOXACIN IN D5W 400 MG/200ML IV SOLN
400.0000 mg | INTRAVENOUS | Status: AC
  Administered 2021-07-05: 400 mg via INTRAVENOUS
  Filled 2021-07-05: qty 200

## 2021-07-05 MED ORDER — ROCURONIUM BROMIDE 100 MG/10ML IV SOLN
INTRAVENOUS | Status: DC | PRN
  Administered 2021-07-05: 50 mg via INTRAVENOUS

## 2021-07-05 MED ORDER — 0.9 % SODIUM CHLORIDE (POUR BTL) OPTIME
TOPICAL | Status: DC | PRN
  Administered 2021-07-05: 1000 mL

## 2021-07-05 MED ORDER — ORAL CARE MOUTH RINSE: 15.0000 mL | Freq: Once | OROMUCOSAL | Status: AC

## 2021-07-05 MED ORDER — OXYCODONE-ACETAMINOPHEN 5-325 MG PO TABS: 1.0000 | ORAL_TABLET | ORAL | 0 refills | Status: DC | PRN

## 2021-07-05 MED ORDER — LACTATED RINGERS IR SOLN
Status: DC | PRN
  Administered 2021-07-05: 1000 mL

## 2021-07-05 MED ORDER — PHENYLEPHRINE HCL-NACL 20-0.9 MG/250ML-% IV SOLN
INTRAVENOUS | Status: DC | PRN
  Administered 2021-07-05: 50 ug/min via INTRAVENOUS

## 2021-07-05 MED ORDER — CHLORHEXIDINE GLUCONATE CLOTH 2 % EX PADS: 6.0000 | MEDICATED_PAD | Freq: Once | CUTANEOUS | Status: DC

## 2021-07-05 MED ORDER — CHLORHEXIDINE GLUCONATE CLOTH 2 % EX PADS
6.0000 | MEDICATED_PAD | Freq: Once | CUTANEOUS | Status: DC
Start: 2021-07-05 — End: 2021-07-05

## 2021-07-05 MED ORDER — CHLORHEXIDINE GLUCONATE 0.12 % MT SOLN
15.0000 mL | Freq: Once | OROMUCOSAL | Status: AC
  Administered 2021-07-05: 15 mL via OROMUCOSAL

## 2021-07-05 MED ORDER — ROCURONIUM BROMIDE 10 MG/ML (PF) SYRINGE
PREFILLED_SYRINGE | INTRAVENOUS | Status: AC
  Filled 2021-07-05: qty 10

## 2021-07-05 MED ORDER — BUPIVACAINE-EPINEPHRINE (PF) 0.25% -1:200000 IJ SOLN
INTRAMUSCULAR | Status: AC
  Filled 2021-07-05: qty 30

## 2021-07-05 MED ORDER — DEXAMETHASONE SODIUM PHOSPHATE 10 MG/ML IJ SOLN
INTRAMUSCULAR | Status: DC | PRN
  Administered 2021-07-05: 6 mg via INTRAVENOUS

## 2021-07-05 SURGICAL SUPPLY — 44 items
APPLIER CLIP 5 13 M/L LIGAMAX5 (MISCELLANEOUS) ×3
APPLIER CLIP ROT 10 11.4 M/L (STAPLE) ×3
BAG COUNTER SPONGE SURGICOUNT (BAG) IMPLANT
CABLE HIGH FREQUENCY MONO STRZ (ELECTRODE) ×3 IMPLANT
CATH URETL OPEN 5X70 (CATHETERS) IMPLANT
CHLORAPREP W/TINT 26 (MISCELLANEOUS) ×3 IMPLANT
CLIP APPLIE 5 13 M/L LIGAMAX5 (MISCELLANEOUS) ×1 IMPLANT
CLIP APPLIE ROT 10 11.4 M/L (STAPLE) ×2 IMPLANT
COVER MAYO STAND XLG (MISCELLANEOUS) ×3 IMPLANT
COVER SURGICAL LIGHT HANDLE (MISCELLANEOUS) ×3 IMPLANT
DERMABOND ADVANCED (GAUZE/BANDAGES/DRESSINGS) ×1
DERMABOND ADVANCED .7 DNX12 (GAUZE/BANDAGES/DRESSINGS) ×2 IMPLANT
DRAPE C-ARM 42X120 X-RAY (DRAPES) IMPLANT
ELECT REM PT RETURN 15FT ADLT (MISCELLANEOUS) ×3 IMPLANT
ENDOLOOP SUT PDS II  0 18 (SUTURE) ×3
ENDOLOOP SUT PDS II 0 18 (SUTURE) ×2 IMPLANT
GLOVE BIO SURGEON STRL SZ7.5 (GLOVE) ×3 IMPLANT
GLOVE BIOGEL PI IND STRL 8 (GLOVE) ×2 IMPLANT
GLOVE BIOGEL PI INDICATOR 8 (GLOVE) ×1
GOWN STRL REUS W/ TWL XL LVL3 (GOWN DISPOSABLE) ×4 IMPLANT
GOWN STRL REUS W/TWL XL LVL3 (GOWN DISPOSABLE) ×6
GRASPER SUT TROCAR 14GX15 (MISCELLANEOUS) IMPLANT
HEMOSTAT SNOW SURGICEL 2X4 (HEMOSTASIS) IMPLANT
IRRIG SUCT STRYKERFLOW 2 WTIP (MISCELLANEOUS) ×3
IRRIGATION SUCT STRKRFLW 2 WTP (MISCELLANEOUS) ×2 IMPLANT
IV CATH 14GX2 1/4 (CATHETERS) ×3 IMPLANT
KIT BASIN OR (CUSTOM PROCEDURE TRAY) ×3 IMPLANT
KIT TURNOVER KIT A (KITS) IMPLANT
NDL INSUFFLATION 14GA 120MM (NEEDLE) ×1 IMPLANT
NEEDLE INSUFFLATION 14GA 120MM (NEEDLE) ×3 IMPLANT
PENCIL SMOKE EVACUATOR (MISCELLANEOUS) IMPLANT
POUCH RETRIEVAL ECOSAC 10 (ENDOMECHANICALS) ×2 IMPLANT
POUCH RETRIEVAL ECOSAC 10MM (ENDOMECHANICALS) ×3
SCISSORS LAP 5X35 DISP (ENDOMECHANICALS) ×3 IMPLANT
SET TUBE SMOKE EVAC HIGH FLOW (TUBING) ×3 IMPLANT
SLEEVE XCEL OPT CAN 5 100 (ENDOMECHANICALS) ×6 IMPLANT
SPIKE FLUID TRANSFER (MISCELLANEOUS) ×3 IMPLANT
STOPCOCK 4 WAY LG BORE MALE ST (IV SETS) IMPLANT
SUT MNCRL AB 4-0 PS2 18 (SUTURE) ×3 IMPLANT
TOWEL OR 17X26 10 PK STRL BLUE (TOWEL DISPOSABLE) ×3 IMPLANT
TOWEL OR NON WOVEN STRL DISP B (DISPOSABLE) IMPLANT
TRAY LAPAROSCOPIC (CUSTOM PROCEDURE TRAY) ×3 IMPLANT
TROCAR BLADELESS OPT 5 100 (ENDOMECHANICALS) ×3 IMPLANT
TROCAR XCEL 12X100 BLDLESS (ENDOMECHANICALS) ×3 IMPLANT

## 2021-07-05 NOTE — Discharge Instructions (Signed)
 CHOLECYSTECTOMY POST OPERATIVE INSTRUCTIONS  Thinking Clearly  The anesthesia may cause you to feel different for 1 or 2 days. Do not drive, drink alcohol, or make any big decisions for at least 2 days.  Nutrition When you wake up, you will be able to drink small amounts of liquid. If you do not feel sick, you can slowly advance your diet to regular foods. Continue to drink lots of fluids, usually about 8 to 10 glasses per day. Eat a high-fiber diet so you don't strain during bowel movements. High-Fiber Foods Foods high in fiber include beans, bran cereals and whole-grain breads, peas, dried fruit (figs, apricots, and dates), raspberries, blackberries, strawberries, sweet corn, broccoli, baked potatoes with skin, plums, pears, apples, greens, and nuts. Activity Slowly increase your activity. Be sure to get up and walk every hour or so to prevent blood clots. No heavy lifting or strenuous activity for 4 weeks following surgery to prevent hernias at your incision sites It is normal to feel tired. You may need more sleep than usual.  Get your rest but make sure to get up and move around frequently to prevent blood clots and pneumonia.  Work and Return to School You can go back to work when you feel well enough. Discuss the timing with your surgeon. You can usually go back to school or work 1 week after an operation. If your work requires heavy lifting or strenuous activity you need to be placed on light duty for 4 weeks following surgery. You can return to gym class, sports or other physical activities 4 weeks after surgery.  Wound Care Always wash your hands before and after touching near your incision site. Do not soak in a bathtub until cleared at your follow up appointment. You may take a shower 24 hours after surgery. A small amount of drainage from the incision is normal. If the drainage is thick and yellow or the site is red, you may have an infection, so call your surgeon. If you  have a drain in one of your incisions, it will be taken out in office when the drainage stops. Steri-Strips will fall off in 7 to 10 days or they will be removed during your first office visit. If you have dermabond glue covering over the incision, allow the glue to flake off on its own. Avoid wearing tight or rough clothing. It may rub your incisions and make it harder for them to heal. Protect the new skin, especially from the sun. The sun can burn and cause darker scarring. Your scar will heal in about 4 to 6 weeks and will become softer and continue to fade over the next year.  The cosmetic appearance of the incisions will improve over the course of the first year after surgery. Sensation around your incision will return in a few weeks or months.  Bowel Movements After intestinal surgery, you may have loose watery stools for several days. If watery diarrhea lasts longer than 3 days, contact your surgeon. Pain medication (narcotics) can cause constipation. Increase the fiber in your diet with high-fiber foods if you are constipated. You can take an over the counter stool softener like Colace to avoid constipation.  Additional over the counter medications can also be used if Colace isn't sufficient (for example, Milk of Magnesia or Miralax).  Pain The amount of pain is different for each person. Some people need only 1 to 3 doses of pain control medication, while others need more. Take alternating doses of tylenol   and ibuprofen around the clock for the first five days following surgery.  This will provide a baseline of pain control and help with inflammation.  Take the narcotic pain medication in addition if needed for severe pain.  Contact Your Surgeon at [phone], if you have: Pain in your right upper abdomen like a gallbladder attack. Pain that will not go away Pain that gets worse A fever of more than 101F (38.3C) Repeated vomiting Swelling, redness, bleeding, or bad-smelling  drainage from your wound site Strong abdominal pain No bowel movement or unable to pass gas for 3 days Watery diarrhea lasting longer than 3 days  Pain Control The goal of pain control is to minimize pain, keep you moving and help you heal. Your surgical team will work with you on your pain plan. Most often a combination of therapies and medications are used to control your pain. You may also be given medication (local anesthetic) at the surgical site. This may help control your pain for several days. Extreme pain puts extra stress on your body at a time when your body needs to focus on healing. Do not wait until your pain has reached a level "10" or is unbearable before telling your doctor or nurse. It is much easier to control pain before it becomes severe. Following a laparoscopic procedure, pain is sometimes felt in the shoulder. This is due to the gas inserted into your abdomen during the procedure. Moving and walking helps to decrease the gas and the right shoulder pain.  Use the guide below for ways to manage your post-operative pain. Learn more by going to facs.org/safepaincontrol.  How Intense Is My Pain Common Therapies to Feel Better       I hardly notice my pain, and it does not interfere with my activities.  I notice my pain and it distracts me, but I can still do activities (sitting up, walking, standing).  Non-Medication Therapies  Ice (in a bag, applied over clothing at the surgical site), elevation, rest, meditation, massage, distraction (music, TV, play) walking and mild exercise Splinting the abdomen with pillows +  Non-Opioid Medications Acetaminophen (Tylenol) Non-steroidal anti-inflammatory drugs (NSAIDS) Aspirin, Ibuprofen (Motrin, Advil) Naproxen (Aleve) Take these as needed, when you feel pain. Both acetaminophen and NSAIDs help to decrease pain and swelling (inflammation).      My pain is hard to ignore and is more noticeable even when I rest.  My  pain interferes with my usual activities.  Non-Medication Therapies  +  Non-Opioid medications  Take on a regular schedule (around-the-clock) instead of as needed. (For example, Tylenol every 6 hours at 9:00 am, 3:00 pm, 9:00 pm, 3:00 am and Motrin every 6 hours at 12:00 am, 6:00 am, 12:00 pm, 6:00 pm)         I am focused on my pain, and I am not doing my daily activities.  I am groaning in pain, and I cannot sleep. I am unable to do anything.  My pain is as bad as it could be, and nothing else matters.  Non-Medication Therapies  +  Around-the-Clock Non-Opioid Medications  +  Short-acting opioids  Opioids should be used with other medications to manage severe pain. Opioids block pain and give a feeling of euphoria (feel high). Addiction, a serious side effect of opioids, is rare with short-term (a few days) use.  Examples of short-acting opioids include: Tramadol (Ultram), Hydrocodone (Norco, Vicodin), Hydromorphone (Dilaudid), Oxycodone (Oxycontin)     The above directions have been adapted from   the American College of [employer] Surgical Patient Education Program.  Please refer to the ACS website if needed: https://[email or web address].org/-/media/files/[email or web address].   Tarrin Menn, MD [facility] [address], [address], [place], [place]  [address] ?  [address] [address], [place], [place]   [address] [phone] ? [phone] ? FAX [phone] Web site: [email or web address]  

## 2021-07-05 NOTE — Op Note (Signed)
? ?Pena: Courtney Pena ([birth year], [id number]) ? ?Date of Surgery: 07/05/2021  ? ?Preoperative Diagnosis: BILIARY DYSKINESIA, ABDOMINAL WALL BULGE  ? ?Postoperative Diagnosis: BILIARY DYSKINESIA, ABDOMINAL WALL BULGE - FINDINGS CONSISTENT WITH A POST-SHINGLES PSEUDOHERNIA ? ?Surgical Procedure: LAPAROSCOPIC CHOLECYSTECTOMY:   ? ?Operative Team Members:  ?Surgeon(s) and Role: ?   * Shae Hinnenkamp, Nickola Major, MD - Primary  ? ?Anesthesiologist: Roderic Palau, MD ?CRNA: Eben Burow, CRNA; Jonna Munro, CRNA  ? ?Anesthesia: General  ? ?Fluids:  ?Total I/O ?In: 1100 [I.V.:800; IV Piggyback:300] ?Out: -  ? ?Complications: None ? ?Drains:  none  ? ?Specimen:  ?ID Type Source Tests Collected by Time Destination  ?1 : Gallbladder Tissue PATH Gallbladder SURGICAL PATHOLOGY Courtney Pena, Nickola Major, MD 07/05/2021 [phone]   ?  ? ?Disposition:  PACU - hemodynamically stable. ? ?Plan of Care: Discharge to home after PACU ? ? ? ?Indications for Procedure: Courtney Pena is a 80 y.o. female who presented with a right abdominal bulge and right sided abdominal pain.  Her history, physical and imaging were concerning for biliary colic and post-shingles pseudohernia with denervation of the lateral abdominal wall musculature.  I recommended laparoscopic cholecystectomy with laparoscopic evaluation of the abdominal wall bulge under insufflation.  The procedure, its risks, benefits and alternatives were discussed and the Pena granted consent to proceed. ? ?Preoperative CT with changes in the abdominal wall musculature: ? ? ?Findings: Bulging of the right abdominal wall. Scarring of the abdominal wall musculature in this area without hernia defect. ? ? ?Subcostal scarring of right abdominal wall transversus abdominis without hernia defect: ? ? ?Normal left sided transversus abdominis muscle: ? ? ? ?Infection status: ?Pena: Courtney Pena Elective Case ?Case: Elective ?Infection Present At Time Of Surgery  (PATOS): None ? ? ?Description of Procedure:  ? ?On the date stated above, the Pena was taken to the operating room suite and placed in supine positioning.  Sequential compression devices were placed on the lower extremities to prevent blood clots.  General endotracheal anesthesia was induced. Preoperative antibiotics were given within 30 minutes of incision.  The Pena's abdomen was prepped and draped in the usual sterile fashion.  A time-out was completed verifying the correct Pena, procedure, positioning and equipment needed for the case. ? ?We began by anesthetizing the skin with local anesthetic and then making a 5 mm incision just below the umbilicus.  We dissected through the subcutaneous tissues to the fascia.  The fascia was grasped and elevated using a Kocher clamp.  A Veress needle was inserted into the abdomen and the abdomen was insufflated to 15 mmHg.  A 5 mm trocar was inserted in this position under optical guidance and then the abdomen was inspected.  There was no trauma to the underlying viscera with initial trocar placement.  Any abnormal findings, other than inflammation in the right upper quadrant, are listed above in the findings section.  Three additional trocars were placed, one 12 mm trocar in the subxiphoid position, one 5 mm trocar in the midline epigastric area and one 24m trocar in the right upper quadrant subcostally.  These were placed under direct vision without any trauma to the underlying viscera.   ? ?The abdominal wall was inspected.  Representative images above.  The right subcostal abdominal bulge corresponded with an area of transversus abdominis scarring pictured above.  There were no hernia defects to be repaired. ? ?The Pena was then placed in head up, left side down positioning.  The gallbladder was identified and  dissected free from its attachments to the omentum allowing the duodenum to fall away.  The infundibulum of the gallbladder was dissected free working  laterally to medially.  The cystic duct and cystic artery were dissected free from surrounding connective tissue.  The infundibulum of the gallbladder was dissected off the cystic plate.  A critical view of safety was obtained with the cystic duct and cystic artery being cleared of connective tissues and clearly the only two structures entering into the gallbladder with the liver clearly visible behind.  Clips were then applied to the cystic duct and cystic artery and then these structures were divided.  The gallbladder was dissected off the cystic plate, placed in an endocatch bag and removed from the 12 mm subxiphoid port site.  The clips were inspected and appeared effective.  The cystic plate was inspected and hemostasis was obtained using electrocautery.  A suction irrigator was used to clean the operative field.  Attention was turned to closure.  The 12 mm subxiphoid port site was closed using a 0-vicryl suture on a fascial suture passer.  The abdomen was desufflated.  The skin was closed using 4-0 monocryl and dermabond.  All sponge and needle counts were correct at the conclusion of the case. ? ? ? ?Courtney Raw, MD ?General, Bariatric, & Minimally Invasive Surgery ?[facility], [place] ? ?

## 2021-07-05 NOTE — Anesthesia Postprocedure Evaluation (Signed)
Anesthesia Post Note ? ?Patient: Courtney Pena ? ?Procedure(s) Performed: LAPAROSCOPIC CHOLECYSTECTOMY ? ?  ? ?Patient location during evaluation: PACU ?Anesthesia Type: General ?Level of consciousness: awake and alert ?Pain management: pain level controlled ?Vital Signs Assessment: post-procedure vital signs reviewed and stable ?Respiratory status: spontaneous breathing, nonlabored ventilation and respiratory function stable ?Cardiovascular status: blood pressure returned to baseline and stable ?Postop Assessment: no apparent nausea or vomiting ?Anesthetic complications: no ? ? ?No notable events documented. ? ?Last Vitals:  ?Vitals:  ? 07/05/21 1030 07/05/21 1045  ?BP: (!) 157/77 (!) 149/72  ?Pulse: 60 60  ?Resp: 14   ?Temp:    ?SpO2: 95% 96%  ?  ?Last Pain:  ?Vitals:  ? 07/05/21 1045  ?TempSrc:   ?PainSc: 2   ? ? ?  ?  ?  ?  ?  ?  ? ?Lennon Boutwell,W. EDMOND ? ? ? ? ?

## 2021-07-05 NOTE — Transfer of Care (Signed)
Immediate Anesthesia Transfer of Care Note ? ?Patient: Courtney Pena ? ?Procedure(s) Performed: LAPAROSCOPIC CHOLECYSTECTOMY ? ?Patient Location: PACU ? ?Anesthesia Type:General ? ?Level of Consciousness: awake, alert , oriented and patient cooperative ? ?Airway & Oxygen Therapy: Patient Spontanous Breathing and Patient connected to face mask oxygen ? ?Post-op Assessment: Report given to RN, Post -op Vital signs reviewed and stable and Patient moving all extremities X 4 ? ?Post vital signs: Reviewed and stable ? ?Last Vitals:  ?Vitals Value Taken Time  ?BP 155/90 07/05/21 0951  ?Temp    ?Pulse 66 07/05/21 0954  ?Resp    ?SpO2 99 % 07/05/21 0954  ?Vitals shown include unvalidated device data. ? ?Last Pain:  ?Vitals:  ? 07/05/21 0702  ?TempSrc:   ?PainSc: 3   ?   ? ?Patients Stated Pain Goal: 2 (07/05/21 8264) ? ?Complications: No notable events documented. ?

## 2021-07-05 NOTE — H&P (Signed)
? ?Admitting Physician: Nickola Major Alanmichael Barmore ? ?Service: General surgery ? ?CC: Abdominal wall bluge and pain ? ?Subjective  ? ?HPI: ?Courtney Pena is an 81 y.o. female who is here for abdominal wall bulge and pain.  ? ?Past Medical History:  ?Diagnosis Date  ? Allergy   ? Arthritis   ? Atrial flutter ([place]) 12/07/2010  ? converted in ED with 300 mg flecainide  ? CAD (coronary artery disease)   ? a. mild per cath in 2004;  b. nonischemic Myoview in March 2012;  c. Lex MV 1/14:  EF 66%, no ischemia  ? Cataract   ? Dysrhythmia   ? A-Fib. has a loop recorder  ? External hemorrhoids 06/07/2010  ? Family history of adverse reaction to anesthesia   ? Daughter has severe N&V  ? Gastric antral vascular ectasia   ? source for gi bleed in 07/2011 - Xarelto stopped  ? GERD (gastroesophageal reflux disease)   ? Gout   ? Hyperlipidemia   ? Hypertension   ? Hypothyroidism   ? Neuromuscular disorder ([place])   ? neuropathy in feet  ? Obesity   ? Personal history of colonic polyps 06/06/2009  ? cecal polyp  ? Rheumatoid arthritis ([place])   ? Shingles 2023  ? Sleep apnea   ? lost weight no longer needs  ? 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    ? ? ?Past Surgical History:  ?Procedure Laterality Date  ? BREAST BIOPSY Left   ? BREAST EXCISIONAL BIOPSY Left   ? benign  ? CARDIAC CATHETERIZATION  1999&2004  ? CARPAL TUNNEL RELEASE Bilateral 2003  ? CATARACT EXTRACTION Bilateral 1990  ? COLONOSCOPY    ? ESOPHAGOGASTRODUODENOSCOPY  08/13/2011  ? Procedure: ESOPHAGOGASTRODUODENOSCOPY (EGD);  Surgeon: Gatha Mayer, MD;  [facility]: [facility] ENDOSCOPY;  Service: Endoscopy;  Laterality: N/A;  ? implantable loop recorder placement  07/20/2020  ? Medtronic Reveal Linq model LNQ 22 ([place] [id number]) implantable loop recorder by Dr Rayann Heman  ? ? ?Family History  ?Problem Relation Age of Onset  ? Heart disease Father   ? Diabetes Maternal Grandfather   ? Hypertension Mother   ? Other Mother   ?     brain  tumor-benign  ? Diabetes Daughter   ?     pre-diabeties  ? Colon polyps Daughter   ? Thyroid disease Daughter   ?     x 2  ? Colon cancer Neg Hx   ? Esophageal cancer Neg Hx   ? Stomach cancer Neg Hx   ? Rectal cancer Neg Hx   ? ? ?Social:  reports that she quit smoking about 28 years ago. Her smoking use included cigarettes. She has a 60.00 pack-year smoking history. She has never used smokeless tobacco. She reports that she does not drink alcohol and does not use drugs. ? ?Allergies:  ?Allergies  ?Allergen Reactions  ? Actos [Pioglitazone] Swelling  ?  Swelling all over body and moonface  ? Lisinopril Swelling  ?  Swelling, may have had some breathing involvement. Angioedema  ? Nsaids Other (See Comments)  ?  Patient reports internal bleeding. Experienced with tolmetin.   ? Penicillins Shortness Of Breath and Swelling  ?  Arm Swelling with Penicillin (Occurred in 1960s) ?Breathing - throat swelling with Amoxicillin (Occurred prior to 2002)  ? Sulfamethoxazole Hives and Itching  ?  "welps all over" immediately after dose  ? Flecainide Other (See Comments)  ?  Blurry  vision  ?  Hctz [Hydrochlorothiazide] Other (See Comments)  ?  Gout  ? Iodinated Contrast Media Itching  ?  Pt. Developed mild itching after receiving IV cm; pt. Held; Dr. Keane Scrape recomended she take 50 mg of benadryl when she goes home-if necessary; Dr Mickey Farber recommends benadryl prior to future exams requiring contrast media, but stated other doctors may recommend another premedication prep.  ? Statins Other (See Comments)  ?  Muscle aches with multiple statins. Able to tolerate rosuvastatin.  ? Gabapentin Other (See Comments)  ?  Caused dysphoria ?  ? Losartan Potassium Other (See Comments)  ?  Lower extremity swelling  ? Zetia [Ezetimibe] Other (See Comments)  ?  Cramps  ? ? ?Medications: ?Current Outpatient Medications  ?Medication Instructions  ? acetaminophen (TYLENOL) 650 mg, Oral, Every 8 hours PRN  ? apixaban (ELIQUIS) 5 mg, Oral, 2 times  daily  ? Apoaequorin (PREVAGEN PO) 1 tablet, Oral, Daily  ? blood glucose meter kit and supplies Dispense based on patient and insurance preference. Use up to four times daily as directed. (FOR ICD-10 E10.9, E11.9).  ? cetirizine (ZYRTEC) 10 mg, Oral, Daily PRN  ? colchicine 0.6 mg, Oral, As needed  ? CoQ10 100 mg, Oral, Daily  ? diclofenac Sodium (VOLTAREN) 2 g, Topical, As needed  ? diltiazem (CARDIZEM CD) 360 MG 24 hr capsule TAKE 1 CAPSULE BY MOUTH  DAILY  ? diltiazem (CARDIZEM) 30 mg, Oral, Every 4 hours PRN  ? DULoxetine (CYMBALTA) 30 MG capsule TAKE 1 CAPSULE BY MOUTH EVERY DAY  ? empagliflozin (JARDIANCE) 10 mg, Oral, Daily  ? ferrous sulfate 650 mg, Oral, Daily  ? glucose blood (ONETOUCH ULTRA) test strip 1 Container, Other, Every morning  ? Insulin Pen Needle 31G X 8 MM MISC Use with pen to inject once daily.  ? Lancets (ONETOUCH ULTRASOFT) lancets Use to check blood sugar daily.  ? levothyroxine (SYNTHROID) 112 MCG tablet TAKE 1 TABLET BY MOUTH DAILY  BEFORE BREAKFAST  ? meclizine (ANTIVERT) 25 MG tablet TAKE 1 TABLET (25 MG TOTAL) BY MOUTH AS NEEDED.  ? metFORMIN (GLUCOPHAGE-XR) 500 MG 24 hr tablet TAKE 4 TABLETS BY MOUTH  DAILY  ? metoprolol succinate (TOPROL-XL) 100 MG 24 hr tablet TAKE 1 TABLET BY MOUTH  TWICE DAILY WITH OR  IMMEDIATELY FOLLOWING MEALS  ? Multiple Vitamin (MULTI-VITAMINS) TABS 1 tablet, Oral, Daily  ? nitroGLYCERIN (NITROSTAT) 0.4 mg, Sublingual, Every 5 min PRN  ? omeprazole (PRILOSEC) 40 MG capsule TAKE 1 CAPSULE BY MOUTH  DAILY  ? ONETOUCH ULTRA test strip USE TO TEST BLOOD SUGAR 3  TIMES DAILY  ? rosuvastatin (CRESTOR) 10 MG tablet TAKE 1 TABLET BY MOUTH  DAILY  ? Semaglutide (1 MG/DOSE) 1 mg, Subcutaneous, Weekly, Patient taking 0.$RemoveBeforeDE'5mg'[id number]$  (by reducing clicks)  ? triamcinolone cream (KENALOG) 0.1 % 1 application., Topical, Daily PRN  ? Vitamin D3 4,000 Units, Oral, Daily  ? ? ?ROS - all of the below systems have been reviewed with the patient and positives are indicated with bold  text ?General: chills, fever or night sweats ?Eyes: blurry vision or double vision ?ENT: epistaxis or sore throat ?Allergy/Immunology: itchy/watery eyes or nasal congestion ?Hematologic/Lymphatic: bleeding problems, blood clots or swollen lymph nodes ?Endocrine: temperature intolerance or unexpected weight changes ?Breast: new or changing breast lumps or nipple discharge ?Resp: cough, shortness of breath, or wheezing ?CV: chest pain or dyspnea on exertion ?GI: as per HPI ?GU: dysuria, trouble voiding, or hematuria ?MSK: joint pain or joint stiffness ?Neuro: TIA or stroke symptoms ?Derm: pruritus and  skin lesion changes ?Psych: anxiety and depression ? ?Objective  ? ?PE ?Blood pressure (!) 156/80, pulse 97, temperature 97.7 ?F (36.5 ?C), temperature source Oral, resp. rate 15, height _0  (1.651 m), weight 77.1 kg, SpO2 96 %. ?Constitutional: NAD; conversant; no deformities ?Eyes: Moist conjunctiva; no lid lag; anicteric; PERRL ?Neck: Trachea midline; no thyromegaly ?Lungs: Normal respiratory effort; no tactile fremitus ?CV: RRR; no palpable thrills; no pitting edema ?GI: Abd Right subcostal bulging without palpable fascial defec; no palpable hepatosplenomegaly ?MSK: Normal range of motion of extremities; no clubbing/cyanosis ?Psychiatric: Appropriate affect; alert and oriented x3 ?Lymphatic: No palpable cervical or axillary lymphadenopathy ? ?Results for orders placed or performed during the hospital encounter of 07/05/21 (from the past 24 hour(s))  ?Glucose, capillary     Status: Abnormal  ? Collection Time: 07/05/21  6:54 AM  ?Result Value Ref Range  ? Glucose-Capillary 153 (H) 70 - 99 mg/dL  ? ?*Note: Due to a large number of results and/or encounters for the requested time period, some results have not been displayed. A complete set of results can be found in Results Review.  ? ? ?Imaging Orders  ?No imaging studies ordered today  ? ? ? ?Assessment and Plan  ? ?Courtney Pena is an 81 y.o. female  with a right subcostal bulge and gallstones here for laparoscopic cholecystectomy with possible hernia repair.  The procedure, its risks, benefits and alternatives were discussed and the patient granted consent

## 2021-07-05 NOTE — Anesthesia Procedure Notes (Signed)
Procedure Name: Intubation ?Date/Time: 07/05/2021 8:47 AM ?Performed by: Jonna Munro, CRNA ?Pre-anesthesia Checklist: Patient identified, Emergency Drugs available, Suction available, Patient being monitored and Timeout performed ?Patient Re-evaluated:Patient Re-evaluated prior to induction ?Oxygen Delivery Method: Circle system utilized ?Preoxygenation: Pre-oxygenation with 100% oxygen ?Induction Type: IV induction ?Ventilation: Mask ventilation without difficulty ?Laryngoscope Size: Mac and 3 ?Grade View: Grade I ?Tube type: Oral ?Tube size: 7.0 mm ?Number of attempts: 1 ?Airway Equipment and Method: Stylet ?Placement Confirmation: ETT inserted through vocal cords under direct vision, positive ETCO2 and breath sounds checked- equal and bilateral ?Secured at: 22 cm ?Tube secured with: Tape ?Dental Injury: Teeth and Oropharynx as per pre-operative assessment  ? ? ? ? ?

## 2021-07-06 ENCOUNTER — Encounter (HOSPITAL_COMMUNITY): Payer: Self-pay | Admitting: Surgery

## 2021-07-06 LAB — SURGICAL PATHOLOGY

## 2021-07-06 LAB — CUP PACEART REMOTE DEVICE CHECK
Date Time Interrogation Session: 20230510230326
Implantable Pulse Generator Implant Date: 20220525

## 2021-07-17 ENCOUNTER — Ambulatory Visit: Payer: Medicare Other | Admitting: Family Medicine

## 2021-07-17 ENCOUNTER — Encounter: Payer: Self-pay | Admitting: Pharmacist

## 2021-07-17 ENCOUNTER — Ambulatory Visit: Payer: Medicare Other | Admitting: Pharmacist

## 2021-07-17 ENCOUNTER — Encounter: Payer: Self-pay | Admitting: Family Medicine

## 2021-07-17 VITALS — BP 130/66 | HR 66 | Ht 65.0 in | Wt 172.2 lb

## 2021-07-17 DIAGNOSIS — E1140 Type 2 diabetes mellitus with diabetic neuropathy, unspecified: Secondary | ICD-10-CM

## 2021-07-17 DIAGNOSIS — R519 Headache, unspecified: Secondary | ICD-10-CM

## 2021-07-17 DIAGNOSIS — R1031 Right lower quadrant pain: Secondary | ICD-10-CM

## 2021-07-17 DIAGNOSIS — E1122 Type 2 diabetes mellitus with diabetic chronic kidney disease: Secondary | ICD-10-CM | POA: Diagnosis not present

## 2021-07-17 DIAGNOSIS — M958 Other specified acquired deformities of musculoskeletal system: Secondary | ICD-10-CM

## 2021-07-17 DIAGNOSIS — N390 Urinary tract infection, site not specified: Secondary | ICD-10-CM

## 2021-07-17 DIAGNOSIS — Z9049 Acquired absence of other specified parts of digestive tract: Secondary | ICD-10-CM | POA: Diagnosis not present

## 2021-07-17 LAB — POCT URINALYSIS DIP (MANUAL ENTRY)
Blood, UA: NEGATIVE
Glucose, UA: NEGATIVE mg/dL
Nitrite, UA: NEGATIVE
Protein Ur, POC: 100 mg/dL — AB
Spec Grav, UA: 1.02 (ref 1.010–1.025)
Urobilinogen, UA: 0.2 E.U./dL
pH, UA: 5.5 (ref 5.0–8.0)

## 2021-07-17 MED ORDER — DULOXETINE HCL 20 MG PO CPEP: 20.0000 mg | ORAL_CAPSULE | Freq: Every day | ORAL | 3 refills | Status: DC

## 2021-07-17 NOTE — Patient Instructions (Signed)
No changes today.    Please discuss Shingrix, jardiance and ozempic with Dr. Andria Frames today.

## 2021-07-17 NOTE — Progress Notes (Signed)
    S:     Chief Complaint  Patient presents with   Medication Management    Medication Review   Courtney Pena is a 81 y.o. female who presents for diabetes evaluation, education, and management. PMH is significant for recent gal bladder removal surgery.  Patient reports procedure was May 10th. Patient denies any post-operative complications with only minor abdominal discomfort at this time.  Last seen by Primary Care Provider, Dr. Andria Frames, on 04/20/2021.   Today, patient arrives in good spirits and presents without any form of assistance. She ambulates without any appearance of pain.   Patient reports Diabetes control has been excellent over the last several months.  She report NOT starting Jardiance (empagliflozin).  She reports restarting Ozempic (semaglutide) '1mg'$  once weekly this past week.  We briefly discussed use with gal bladder disease and removal.    Patient reports taking all medications as prescribed. \  Do you feel that your medications are working for you? yes  Patient denies excess nocturia (nighttime urination) with only 1-2 times per night.  She does report increase in urinary urgency.    O:  Physical Exam Constitutional:      Appearance: Normal appearance. She is normal weight.  Pulmonary:     Effort: Pulmonary effort is normal.  Musculoskeletal:     Right lower leg: No edema.     Left lower leg: No edema.  Neurological:     Mental Status: She is alert.  Psychiatric:        Mood and Affect: Mood normal.        Behavior: Behavior normal.    Review of Systems  Gastrointestinal:  Positive for abdominal pain (Minor post-operative site related pain).  All other systems reviewed and are negative.   Lab Results  Component Value Date   HGBA1C 6.7 (H) 06/28/2021   Vitals:   07/17/21 1046  BP: 130/66  Pulse: 66  SpO2: 98%    Lipid Panel     Component Value Date/Time   CHOL 144 07/14/2020 1121   TRIG 77 07/14/2020 1121   HDL 60 07/14/2020  1121   CHOLHDL 2.4 07/14/2020 1121   CHOLHDL 4.7 01/27/2015 1238   VLDL 28 01/27/2015 1238   LDLCALC 69 07/14/2020 1121   LDLDIRECT 62 11/24/2018 1211   LDLDIRECT 178 (H) 05/12/2015 1229     A/P: Diabetes longstanding with improved control on Ozempic (semaglutide) who has recently had gal bladder surgery.  Recently self-restarted Ozempic and has NOT yet started Jardiance (empagliflozin).  Medication adherence appears  to be excellent.  -Restarted GLP-1 Ozempic (semaglutide) recently by patient. Plan to discuss with PCP later today.  - Has not yet Started SGLT2-I Jardiance (empagliflozin) - asked her to clarify treatment plan and goals for Jardiane with PCP today. Counseled on common side effects and sick day rules. -Next A1c anticipated 2-3 months.   Hypertension lat goal with use of metoprolol and diltiazem which she is taking for benefit of A-fib.   Not change suggested today.   Written patient instructions provided. Patient verbalized understanding of treatment plan. Total time in face to face counseling 12 minutes.    Follow up pharmacist Prn.   PCP clinic visit in prn.

## 2021-07-17 NOTE — Assessment & Plan Note (Signed)
Diabetes longstanding with improved control on Ozempic (semaglutide) who has recently had gal bladder surgery.  Recently self-restarted Ozempic and has NOT yet started Jardiance (empagliflozin).  Medication adherence appears  to be excellent.  -Restarted GLP-1 Ozempic (semaglutide) recently by patient. Plan to discuss with PCP later today.  - Has not yet Started SGLT2-I Jardiance (empagliflozin) - asked her to clarify treatment plan and goals for Jardiane with PCP today. Counseled on common side effects and sick day rules. -Next A1c anticipated 2-3 months.

## 2021-07-17 NOTE — Progress Notes (Signed)
Reviewed: I agree with Dr. Koval's documentation and management. 

## 2021-07-17 NOTE — Patient Instructions (Signed)
I will call with urine and blood test results. The "hernia" that you thought you had was a post herpetic (means after the shingles) pseudo- hernia (not a real hernia - just an area of paralyzed muscles from nerve damage.) Check to see if you have duloxetine at home.  It is the nerve pain medication.  I sent in a prescription in case you don't have it. OK to use the diclofenac on your feet You might want to try capsaicin roll on for your feet and other aches.

## 2021-07-18 ENCOUNTER — Encounter: Payer: Self-pay | Admitting: Family Medicine

## 2021-07-18 LAB — SEDIMENTATION RATE: Sed Rate: 47 mm/hr — ABNORMAL HIGH (ref 0–40)

## 2021-07-18 NOTE — Assessment & Plan Note (Signed)
Start duloxetine (if not already taking.)  DM control to prevent further damage.  Check A1C next visit.

## 2021-07-18 NOTE — Assessment & Plan Note (Addendum)
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  We shall see if this resolves over time.  No true hernia seen on laporoscopy.

## 2021-07-18 NOTE — Assessment & Plan Note (Signed)
Minor concern for temporal arteritis based on history and exam.  Less likely with minimally elevated sed rate.

## 2021-07-18 NOTE — Progress Notes (Signed)
    SUBJECTIVE:   CHIEF COMPLAINT / HPI:   Multiple issues: FU RUQ pain.  Now 2 weeks S/P lap chole.  Recovering well with minimal PO pain.  Appetite and wt are good.  It is too soon to tell if the pain is fully or partially relieved by cholecystectomy.  She also is at risk for post herpetic neuralgia in the same area. Buldging/concern for hernia.  Surgeon felt good fit for "post herpetic pseudo hernia", a condition that I was unfamiliar with.  By my read, yes, it is a good clinical fit.  We will see if she has the buldge after she fully recovers. Urinary frequency since hospital discharge.  No dysuria, fever or flank pain.   C/O worsening numbness and tingling of feet.  Diabetic neuropathy is on her problem list.  Cannot tolerate gabapentin.  My med list contains duloxetine, which I likely prescribed for neuropathic pain.  She needs to check at home, but doubts that she has been taking this med. Left temporal headache, mild and intermitant since surgery.  Feels tenderness to temple.  No visual changes.     OBJECTIVE:   BP 130/66   Pulse 66   Ht '5\' 5"'$  (1.651 m)   Wt 172 lb 3.2 oz (78.1 kg)   SpO2 98%   BMI 28.66 kg/m   Mild tenderness over left temporalis muscle/temporal artery. Neck supple  Lungs clear. Cardiac RRR without m or g Abd benign  ASSESSMENT/PLAN:   Type 2 diabetes mellitus with diabetic neuropathy, unspecified (HCC) Start duloxetine (if not already taking.)  DM control to prevent further damage.  Check A1C next visit.  History of cholecystectomy We shall see about how much pain lingers after she has recovered from her surgery.  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  We shall see if this resolves over time.  No true hernia seen on laporoscopy.  Headache Minor concern for temporal arteritis based on history and exam.  Less likely with minimally elevated sed rate.  UTI (urinary tract infection) UA suggests UTI.  Since  she is afebrile, will await culture before prescribing antibiotics.     [name], MD [place]

## 2021-07-18 NOTE — Assessment & Plan Note (Signed)
UA suggests UTI.  Since she is afebrile, will await culture before prescribing antibiotics.

## 2021-07-18 NOTE — Assessment & Plan Note (Signed)
We shall see about how much pain lingers after she has recovered from her surgery.

## 2021-07-18 NOTE — Assessment & Plan Note (Signed)
>>  ASSESSMENT AND PLAN FOR TYPE 2 DIABETES MELLITUS WITH DIABETIC NEUROPATHY, UNSPECIFIED (HCC) WRITTEN ON 07/18/2021  2:25 PM BY HENSEL, Santiago Bumpers, MD  Start duloxetine (if not already taking.)  DM control to prevent further damage.  Check A1C next visit.

## 2021-07-19 ENCOUNTER — Telehealth: Payer: Self-pay | Admitting: Family Medicine

## 2021-07-19 LAB — URINE CULTURE

## 2021-07-19 NOTE — Telephone Encounter (Signed)
Called and informed no UTI.  No antibiotics needed.

## 2021-07-19 NOTE — Progress Notes (Signed)
[facility] Summary Report / Loop Recorder 

## 2021-07-29 ENCOUNTER — Other Ambulatory Visit: Payer: Self-pay | Admitting: Family Medicine

## 2021-07-29 DIAGNOSIS — E1140 Type 2 diabetes mellitus with diabetic neuropathy, unspecified: Secondary | ICD-10-CM

## 2021-08-01 ENCOUNTER — Encounter: Payer: Self-pay | Admitting: *Deleted

## 2021-08-07 ENCOUNTER — Encounter: Payer: Medicare Other | Admitting: Cardiology

## 2021-08-10 NOTE — Progress Notes (Signed)
    SUBJECTIVE:   CHIEF COMPLAINT / HPI:   Headaches Reports that she has been having left-sided head pressure with associated numbness. Constant pain for close to 6 months. She last saw PCP on 5/22 and mentioned this to him. There was consideration for temporal arteritis but she had mildly elevated sed rate at 47.   Also feels that left eye is blurred with this. No nausea. Tylenol lessens the intensity. Unable to say if anything makes it worse. Reports that she has "retina problems" for which she is seeing a retina specialist. Was doing injections but those are on hold for the moment. Next appointment in July.   Mother had a brain tumor. Nephew also with a brain tumor that was recently diagnosed in the last 6 months- glioblastoma. She is concerned about this, understandably, and is wondering if she should have imaging.  Also undergoing stress.  Hypertension Checks BP at home, typically runs 140-145/70-80.   PERTINENT  PMH / PSH:  Shingles, HTN, PAF, sleep apnea, T2DM, hypothyroidism  OBJECTIVE:   BP 140/70   Pulse 72   Wt 171 lb 9.6 oz (77.8 kg)   SpO2 98%   BMI 28.56 kg/m   Vitals:   08/11/21 1012 08/11/21 1053  BP: (!) 162/94 140/70  Pulse: 72   SpO2: 98%    General: NAD, pleasant, able to participate in exam HEENT: Normocephalic, EOMI, pupils sluggishly reactive b/l, tenderness to palpation of left scalp, prominent temporal artery left side Respiratory: Normal respiratory effort Neuro:  Cranial Nerves: II: Visual Fields are full. PERRL.  III,IV, VI: EOMI without ptosis or diplopia.  V: Facial sensation is symmetric to temperature VII: Facial movement is symmetric.  VIII: hearing is intact to voice X: Palate elevates symmetrically XI: Shoulder shrug is symmetric. XII: tongue is midline without atrophy or fasciculations.  Motor: Tone is normal. Bulk is normal. 5/5 strength was present in all four extremities.  Sensory: Sensation is slighly asymmetric to light  touch and temperature in left side of face compared to right Cerebellar: FNF and HKS are intact bilaterally  ASSESSMENT/PLAN:   Headache Story is certainly concerning for temporal arteritis, although has been going on for 6 months. She also complains of some right shoulder pain- PMR can also be seen with this. Her sedimentation rate was mildly elevated last month. Will repeat today. She does have some tenderness along temporal artery and headache is reproducible to palpation of left frontal/temporal lobe. Neuro examination unremarkable today.  -Start Prednisone 60 mg daily -Urgent Amb ref Gen Surg for temporal artery biopsy -Repeat Sed rate -If work up reassuring against TA, can consider CT imaging for further investigation   HYPERTENSION, BENIGN SYSTEMIC Elevated on initial check- improved on recheck. Continue current medication regimen. If stays persistently elevated at next visit could discuss medication adjustments with PCP.   Type 2 diabetes mellitus with diabetic neuropathy, unspecified ([place]) Advised her to monitor her sugars now that we are starting Prednisone. She is aware. May need adjusts down the line, depending on how long she requires steroids for.       Sharion Settler, [place]

## 2021-08-11 ENCOUNTER — Telehealth: Payer: Self-pay | Admitting: *Deleted

## 2021-08-11 ENCOUNTER — Encounter: Payer: Self-pay | Admitting: Family Medicine

## 2021-08-11 ENCOUNTER — Ambulatory Visit: Payer: Medicare Other | Admitting: Family Medicine

## 2021-08-11 DIAGNOSIS — G8929 Other chronic pain: Secondary | ICD-10-CM

## 2021-08-11 DIAGNOSIS — I10 Essential (primary) hypertension: Secondary | ICD-10-CM

## 2021-08-11 DIAGNOSIS — R519 Headache, unspecified: Secondary | ICD-10-CM

## 2021-08-11 DIAGNOSIS — E1140 Type 2 diabetes mellitus with diabetic neuropathy, unspecified: Secondary | ICD-10-CM

## 2021-08-11 MED ORDER — PREDNISONE 20 MG PO TABS: 60.0000 mg | ORAL_TABLET | Freq: Every day | ORAL | 0 refills | Status: DC

## 2021-08-11 NOTE — Telephone Encounter (Signed)
-----   Message from Sharion Settler, DO sent at 08/11/2021  1:43 PM EDT ----- Regarding: Urgent Referral to Gen Surg Hi!  I spoke to Delane Stalling briefly but this is a patient I saw today in clinic. I want her to get in with general surgery ASAP, ideally next week, for left temporal artery biopsy. I have placed a referral.  Thanks! Dawson Bills

## 2021-08-11 NOTE — Assessment & Plan Note (Signed)
Advised her to monitor her sugars now that we are starting Prednisone. She is aware. May need adjusts down the line, depending on how long she requires steroids for.

## 2021-08-11 NOTE — Assessment & Plan Note (Signed)
>>  ASSESSMENT AND PLAN FOR TYPE 2 DIABETES MELLITUS WITH DIABETIC NEUROPATHY, UNSPECIFIED (HCC) WRITTEN ON 08/11/2021 12:21 PM BY Sabino Dick  Advised her to monitor her sugars now that we are starting Prednisone. She is aware. May need adjusts down the line, depending on how long she requires steroids for.

## 2021-08-11 NOTE — Patient Instructions (Addendum)
It was wonderful to see you today.  Today we talked about:  -We are getting blood work today. -I am starting you on steroids. Take 60 mg once a day. -I am sending you a referral to general surgery for a temporal artery biopsy.  -Return for your appointment with Dr. Andria Frames on 7/3  Thank you for choosing [facility].   Please call [phone] with any questions about today's appointment.  Please be sure to schedule follow up at the front  desk before you leave today.   Sharion Settler, DO PGY-2 Family Medicine

## 2021-08-11 NOTE — Telephone Encounter (Signed)
I have left a message with the referral coordinator (Mya) at [facility] to see what steps need to be taken to get this done ASAP per Dr. Nita Sells. Courtney Pena, CMA

## 2021-08-11 NOTE — Assessment & Plan Note (Signed)
Elevated on initial check- improved on recheck. Continue current medication regimen. If stays persistently elevated at next visit could discuss medication adjustments with PCP.

## 2021-08-11 NOTE — Assessment & Plan Note (Addendum)
Story is certainly concerning for temporal arteritis, although has been going on for 6 months. She also complains of some right shoulder pain- PMR can also be seen with this. Her sedimentation rate was mildly elevated last month. Will repeat today. She does have some tenderness along temporal artery and headache is reproducible to palpation of left frontal/temporal lobe. Neuro examination unremarkable today.  -Start Prednisone 60 mg daily -Urgent Amb ref Gen Surg for temporal artery biopsy -Repeat Sed rate -If work up reassuring against TA, can consider CT imaging for further investigation

## 2021-08-12 LAB — SEDIMENTATION RATE: Sed Rate: 44 mm/hr — ABNORMAL HIGH (ref 0–40)

## 2021-08-14 NOTE — Telephone Encounter (Signed)
Per referral message- Mya was going to reach out to patient and try to get her scheduled for Wednesday.  Shanekqua Schaper,CMA

## 2021-08-18 NOTE — Telephone Encounter (Signed)
I called patient to follow-up.  She reports that she has not yet heard from [facility].  I then called [facility] to follow-up and close the loop.  They state that they will work on the referral today, will hopefully try to reach patient today.  I reiterated that this is an urgent referral that she needs temporal artery biopsy. They are aware.

## 2021-08-21 ENCOUNTER — Other Ambulatory Visit: Payer: Self-pay | Admitting: Family Medicine

## 2021-08-21 DIAGNOSIS — G8929 Other chronic pain: Secondary | ICD-10-CM

## 2021-08-21 MED ORDER — PREDNISONE 20 MG PO TABS: 60.0000 mg | ORAL_TABLET | Freq: Every day | ORAL | 0 refills | Status: AC

## 2021-08-21 NOTE — Progress Notes (Signed)
Refill of Prednisone as we await temporal artery bx. Have prescribed enough to get her to PCP appt next week. Discussed plan with patient via telephone.

## 2021-08-22 ENCOUNTER — Other Ambulatory Visit: Payer: Self-pay | Admitting: Surgery

## 2021-08-28 ENCOUNTER — Encounter: Payer: Self-pay | Admitting: Family Medicine

## 2021-08-28 ENCOUNTER — Ambulatory Visit: Payer: Medicare Other | Admitting: Family Medicine

## 2021-08-28 DIAGNOSIS — G8929 Other chronic pain: Secondary | ICD-10-CM

## 2021-08-28 DIAGNOSIS — I10 Essential (primary) hypertension: Secondary | ICD-10-CM | POA: Diagnosis not present

## 2021-08-28 DIAGNOSIS — N1831 Chronic kidney disease, stage 3a: Secondary | ICD-10-CM

## 2021-08-28 DIAGNOSIS — R519 Headache, unspecified: Secondary | ICD-10-CM | POA: Diagnosis not present

## 2021-08-28 LAB — POCT UA - MICROALBUMIN
Creatinine, POC: 200 mg/dL
Microalbumin Ur, POC: 150 mg/L

## 2021-08-28 NOTE — Patient Instructions (Signed)
DUE TO COVID-19 ONLY TWO VISITORS  (aged 81 and older)  ARE ALLOWED TO COME WITH YOU AND STAY IN THE WAITING ROOM ONLY DURING PRE OP AND PROCEDURE.   **NO VISITORS ARE ALLOWED IN THE SHORT STAY AREA OR RECOVERY ROOM!!**  IF YOU WILL BE ADMITTED INTO THE HOSPITAL YOU ARE ALLOWED ONLY FOUR SUPPORT PEOPLE DURING VISITATION HOURS ONLY (7 AM -8PM)   The support person(s) must pass our screening, gel in and out, and wear a mask at all times, including in the patient's room. Patients must also wear a mask when staff or their support person are in the room. Visitors GUEST BADGE MUST BE WORN VISIBLY  One adult visitor may remain with you overnight and MUST be in the room by 8 P.M.     Your procedure is scheduled on: 08/31/21   Report to [facility] Main Entrance    Report to admitting at: 9:00 AM   Call this number if you have problems the morning of surgery [phone]   Do not eat food :After Midnight.   After Midnight you may have the following liquids until : 8:00 AM DAY OF SURGERY  Water Black Coffee (sugar ok, NO MILK/CREAM OR CREAMERS)  Tea (sugar ok, NO MILK/CREAM OR CREAMERS) regular and decaf                             Plain Jell-O (NO RED)                                           Fruit ices (not with fruit pulp, NO RED)                                     Popsicles (NO RED)                                                                  Juice: apple, WHITE grape, WHITE cranberry Sports drinks like Gatorade (NO RED) Clear broth(vegetable,chicken,beef)   Oral Hygiene is also important to reduce your risk of infection.                                    Remember - BRUSH YOUR TEETH THE MORNING OF SURGERY WITH YOUR REGULAR TOOTHPASTE   Do NOT smoke after Midnight   Take these medicines the morning of surgery with A SIP OF WATER: metoprolol,diltiazem,synthroid,omeprazole.Tylenol,duloxetine,colchicine as needed.  How to Manage Your Diabetes Before and After  Surgery  Why is it important to control my blood sugar before and after surgery? Improving blood sugar levels before and after surgery helps healing and can limit problems. A way of improving blood sugar control is eating a healthy diet by:  Eating less sugar and carbohydrates  Increasing activity/exercise  Talking with your doctor about reaching your blood sugar goals High blood sugars (greater than 180 mg/dL) can raise your risk of infections and slow your recovery, so you will need to  focus on controlling your diabetes during the weeks before surgery. Make sure that the doctor who takes care of your diabetes knows about your planned surgery including the date and location.  How do I manage my blood sugar before surgery? Check your blood sugar at least 4 times a day, starting 2 days before surgery, to make sure that the level is not too high or low. Check your blood sugar the morning of your surgery when you wake up and every 2 hours until you get to the Short Stay unit. If your blood sugar is less than 70 mg/dL, you will need to treat for low blood sugar: Do not take insulin. Treat a low blood sugar (less than 70 mg/dL) with  cup of clear juice (cranberry or apple), 4 glucose tablets, OR glucose gel. Recheck blood sugar in 15 minutes after treatment (to make sure it is greater than 70 mg/dL). If your blood sugar is not greater than 70 mg/dL on recheck, call [phone] for further instructions. Report your blood sugar to the short stay nurse when you get to Short Stay.  If you are admitted to the hospital after surgery: Your blood sugar will be checked by the staff and you will probably be given insulin after surgery (instead of oral diabetes medicines) to make sure you have good blood sugar levels. The goal for blood sugar control after surgery is 80-180 mg/dL.   WHAT DO I DO ABOUT MY DIABETES MEDICATION?  Do not take oral diabetes medicines (pills) the morning of surgery.  THE DAY  BEFORE SURGERY, take metformin as usual.      THE MORNING OF SURGERY, DO NOT TAKE ANY ORAL DIABETIC MEDICATIONS DAY OF YOUR SURGERY  The day of surgery, do not take other diabetes injectables, including Byetta (exenatide), Bydureon (exenatide ER), Victoza (liraglutide), or Trulicity (dulaglutide).  Bring CPAP mask and tubing day of surgery.                              You may not have any metal on your body including hair pins, jewelry, and body piercing             Do not wear make-up, lotions, powders, perfumes/cologne, or deodorant  Do not wear nail polish including gel and S&S, artificial/acrylic nails, or any other type of covering on natural nails including finger and toenails. If you have artificial nails, gel coating, etc. that needs to be removed by a nail salon please have this removed prior to surgery or surgery may need to be canceled/ delayed if the surgeon/ anesthesia feels like they are unable to be safely monitored.   Do not shave  48 hours prior to surgery.               Men may shave face and neck.   Do not bring valuables to the hospital. [place].   Contacts, dentures or bridgework may not be worn into surgery.   Bring small overnight bag day of surgery.   DO NOT [place]. PHARMACY WILL DISPENSE MEDICATIONS LISTED ON YOUR MEDICATION LIST TO YOU DURING YOUR ADMISSION Courtney Pena!    Patients discharged on the day of surgery will not be allowed to drive home.  Someone NEEDS to stay with you for the first 24 hours after  anesthesia.   Special Instructions: Bring a copy of your healthcare power of attorney and living will documents         the day of surgery if you haven't scanned them before.              Please read over the following fact sheets you were given: IF YOU HAVE QUESTIONS ABOUT YOUR PRE-OP INSTRUCTIONS PLEASE CALL [phone]     [facility] - Preparing for  Surgery Before surgery, you can play an important role.  Because skin is not sterile, your skin needs to be as free of germs as possible.  You can reduce the number of germs on your skin by washing with CHG (chlorahexidine gluconate) soap before surgery.  CHG is an antiseptic cleaner which kills germs and bonds with the skin to continue killing germs even after washing. Please DO NOT use if you have an allergy to CHG or antibacterial soaps.  If your skin becomes reddened/irritated stop using the CHG and inform your nurse when you arrive at Short Stay. Do not shave (including legs and underarms) for at least 48 hours prior to the first CHG shower.  You may shave your face/neck. Please follow these instructions carefully:  1.  Shower with CHG Soap the night before surgery and the  morning of Surgery.  2.  If you choose to wash your hair, wash your hair first as usual with your  normal  shampoo.  3.  After you shampoo, rinse your hair and body thoroughly to remove the  shampoo.                           4.  Use CHG as you would any other liquid soap.  You can apply chg directly  to the skin and wash                       Gently with a scrungie or clean washcloth.  5.  Apply the CHG Soap to your body ONLY FROM THE NECK DOWN.   Do not use on face/ open                           Wound or open sores. Avoid contact with eyes, ears mouth and genitals (private parts).                       Wash face,  Genitals (private parts) with your normal soap.             6.  Wash thoroughly, paying special attention to the area where your surgery  will be performed.  7.  Thoroughly rinse your body with warm water from the neck down.  8.  DO NOT shower/wash with your normal soap after using and rinsing off  the CHG Soap.                9.  Pat yourself dry with a clean towel.            10.  Wear clean pajamas.            11.  Place clean sheets on your bed the night of your first shower and do not  sleep with pets. Day  of Surgery : Do not apply any lotions/deodorants the morning of surgery.  Please wear clean clothes to the hospital/surgery center.  FAILURE TO FOLLOW  THESE INSTRUCTIONS MAY RESULT IN THE CANCELLATION OF YOUR SURGERY PATIENT SIGNATURE_________________________________  NURSE SIGNATURE__________________________________  ________________________________________________________________________

## 2021-08-28 NOTE — Patient Instructions (Addendum)
Stop Xarelto after today's dose.  Off Tuesday, Weds and Thurs.  Ask Dr. Ninfa Linden when it is safe you to restart.   I will call as soon as I get the biopsy results.  Hopefully we can stop your prednisone. See me in 2-3 weeks.   The name of the disease we are concerned about is temporal arteritis AKA giant cell arteritis.

## 2021-08-28 NOTE — Assessment & Plan Note (Signed)
No changes until bx back and we decide on pred.  If we can dc pred, check BP and see if needs increase antihypertensive.  If pred is to continue for 6 months, will almost certainly need more antihypertensive treatment.

## 2021-08-28 NOTE — Assessment & Plan Note (Signed)
Await biopsy results and decide on prednisone treatment.

## 2021-08-28 NOTE — Progress Notes (Signed)
    SUBJECTIVE:   CHIEF COMPLAINT / HPI:   FU left temporal headaches.  Begun on high dose pred for concern for temporal arteritis.  Has biopsy planned for 3 days.  Headaches slightly improved.  No vision changes.  Knows to hold xarelto for two days prior to surg. Having bizarre dreams on pred.  No hallucinations.  Wants to be off ASAP.  BP up today.  No CP or SOB.  Of course, on pred    OBJECTIVE:   BP (!) 173/91   Pulse 67   Ht '5\' 5"'$  (1.651 m)   Wt 171 lb 3.2 oz (77.7 kg)   SpO2 98%   BMI 28.49 kg/m   Cardiac RRR without m or g Lungs clear   ASSESSMENT/PLAN:   HYPERTENSION, BENIGN SYSTEMIC No changes until bx back and we decide on pred.  If we can dc pred, check BP and see if needs increase antihypertensive.  If pred is to continue for 6 months, will almost certainly need more antihypertensive treatment.  Headache Await biopsy results and decide on prednisone treatment.     [name], MD [place]

## 2021-08-30 ENCOUNTER — Encounter (HOSPITAL_COMMUNITY): Payer: Self-pay

## 2021-08-30 ENCOUNTER — Encounter (HOSPITAL_COMMUNITY)
Admission: RE | Admit: 2021-08-30 | Discharge: 2021-08-30 | Disposition: A | Discharge status: Home / Self Care | Payer: Medicare Other | Source: Ambulatory Visit | Attending: Surgery | Admitting: Surgery

## 2021-08-30 ENCOUNTER — Other Ambulatory Visit: Payer: Self-pay

## 2021-08-30 VITALS — BP 148/87 | HR 63 | Temp 97.5°F | Ht 65.0 in

## 2021-08-30 DIAGNOSIS — Z01812 Encounter for preprocedural laboratory examination: Secondary | ICD-10-CM | POA: Diagnosis present

## 2021-08-30 DIAGNOSIS — I10 Essential (primary) hypertension: Secondary | ICD-10-CM | POA: Diagnosis not present

## 2021-08-30 DIAGNOSIS — E1140 Type 2 diabetes mellitus with diabetic neuropathy, unspecified: Secondary | ICD-10-CM | POA: Diagnosis not present

## 2021-08-30 LAB — CBC
HCT: 36.4 % (ref 36.0–46.0)
Hemoglobin: 11.5 g/dL — ABNORMAL LOW (ref 12.0–15.0)
MCH: 27.9 pg (ref 26.0–34.0)
MCHC: 31.6 g/dL (ref 30.0–36.0)
MCV: 88.3 fL (ref 80.0–100.0)
Platelets: 223 10*3/uL (ref 150–400)
RBC: 4.12 MIL/uL (ref 3.87–5.11)
RDW: 15.9 % — ABNORMAL HIGH (ref 11.5–15.5)
WBC: 9.3 10*3/uL (ref 4.0–10.5)
nRBC: 0 % (ref 0.0–0.2)

## 2021-08-30 LAB — BASIC METABOLIC PANEL
Anion gap: 7 (ref 5–15)
BUN: 27 mg/dL — ABNORMAL HIGH (ref 8–23)
CO2: 28 mmol/L (ref 22–32)
Calcium: 9.8 mg/dL (ref 8.9–10.3)
Chloride: 101 mmol/L (ref 98–111)
Creatinine, Ser: 1.34 mg/dL — ABNORMAL HIGH (ref 0.44–1.00)
GFR, Estimated: 40 mL/min — ABNORMAL LOW (ref >60)
Glucose, Bld: 302 mg/dL — ABNORMAL HIGH (ref 70–99)
Potassium: 4.9 mmol/L (ref 3.5–5.1)
Sodium: 136 mmol/L (ref 135–145)

## 2021-08-30 LAB — GLUCOSE, CAPILLARY: Glucose-Capillary: 328 mg/dL — ABNORMAL HIGH (ref 70–99)

## 2021-08-30 NOTE — Progress Notes (Signed)
Glucose-capillary: 328.

## 2021-08-30 NOTE — H&P (Signed)
REFERRING PHYSICIAN: [name], MD  PROVIDER: Beverlee Nims, MD  MRN: [id number] DOB: 05/11/1940 DATE OF ENCOUNTER: 08/22/2021 Subjective   Chief Complaint: New Consultation (Left Temporal Artery Biopsy )   History of Present Illness: Courtney Pena is a 81 y.o. female who is seen today as an office consultation for evaluation of New Consultation (Left Temporal Artery Biopsy ) .   This is a patient referred to [name] for evaluation for possible temporal artery biopsy. She has been having left-sided headaches and some visual disturbances for 6 months. She is currently on steroids. She actually underwent a laparoscopic cholecystectomy last month by one of my partners. She has no issues with right-sided headaches. She is on Eliquis for A-fib  Review of Systems: A complete review of systems was obtained from the patient. I have reviewed this information and discussed as appropriate with the patient. See HPI as well for other ROS.  ROS   Medical History: Past Medical History:  Diagnosis Date  Arthritis  Diabetes mellitus without complication (CMS-HCC)  Hyperlipidemia  Hypertension  Sleep apnea   Patient Active Problem List  Diagnosis  ANA positive  Rheumatoid arthritis of multiple sites with negative rheumatoid factor (CMS-HCC)  Idiopathic chronic gout of multiple sites without tophus  Acquired deformity of the abdomen  After-cataract obscuring vision  Anemia due to GI blood loss  Chronic anticoagulation  Chronic diastolic CHF (congestive heart failure) (CMS-HCC)  CKD (chronic kidney disease), stage III (CMS-HCC)  Coronary atherosclerosis  Diabetes mellitus (CMS-HCC)  Diabetic retinopathy, nonproliferative, moderate (CMS-HCC)  Diverticulosis of colon  Dizziness and giddiness  Essential hypertension, benign  Fatigue  Gastric antral vascular ectasia  Headache  History of cholecystectomy  History of CVA (cerebrovascular accident)   Hypercholesterolemia  Hypertensive retinopathy of both eyes  Hypothyroidism  Lower abdominal pain  Lumbar back pain with radiculopathy affecting left lower extremity  Myalgia due to statin  Obesity (BMI 30.0-34.9), unspecified  Obstructive sleep apnea  Degenerative arthritis of thumb  Paroxysmal atrial fibrillation (CMS-HCC)  Post herpetic neuralgia  Presence of intraocular lens  Preventative health care  Pulmonary nodule  Reflux esophagitis  Rotator cuff syndrome of right shoulder  Secondary hypercoagulable state (CMS-HCC)  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  Sudden hearing loss, left  Tenosynovitis of left foot  Tinnitus  Transaminitis  Type 2 diabetes mellitus with diabetic neuropathy, unspecified (CMS-HCC)  UTI (urinary tract infection)  Vitamin D deficiency  Vitreomacular adhesion of left eye   Past Surgical History:  Procedure Laterality Date  CHOLECYSTECTOMY  HERNIA REPAIR    Allergies  Allergen Reactions  Tolmetin Other (See Comments)  Patient reports internal bleeding  Actos [Pioglitazone] Other (See Comments)  Swelling all over body  Flecainide Other (See Comments)  Blurry vision  Gabapentin Other (See Comments)  Caused dysphoria  Hydrochlorothiazide Other (See Comments)  Gout  Iodinated Contrast Media Other (See Comments)  Patient developed mild itching after receiving IV cm; patient held; Dr. Keane Scrape recommended she take 50 mg of benadryl when she goes home- if necessary; Dr. Keane Scrape recommends benadryl prior to future exams requiring contrast media, but stated other doctors may recommend another premedication prep.  Jardiance [Empagliflozin] Other (See Comments)  Patient reported "it made her kidneys not work." Patient resists retrial/alternative.  Lisinopril Swelling  May have had some breathing involvement.  Losartan Potassium Other (See Comments)  Lower extremity swelling  Nsaids (Non-Steroidal Anti-Inflammatory Drug) Other  (See Comments)  Patient reports internal bleeding. Experienced with tolmetin.  Penicillins Other (See Comments)  Arm swelling with Penicillin (Occurred in 1960s). Breathing- throat swelling with Amoxicillin (Occurred prior to 2002).  Statins-Hmg-Coa Reductase Inhibitors Other (See Comments)  Muscle aches with multiple statins. Able to tolerate rosuvastatin.  Sulfamethoxazole Other (See Comments)  "welps all over" immediately after dose  Zetia [Ezetimibe] Other (See Comments)  Cramps   Current Outpatient Medications on File Prior to Visit  Medication Sig Dispense Refill  acetaminophen (TYLENOL) 650 MG ER tablet Take 650 mg by mouth every 8 (eight) hours as needed  allopurinoL (ZYLOPRIM) 100 MG tablet Take 1 tablet (100 mg total) by mouth once daily 30 tablet 2  apixaban (ELIQUIS) 5 mg tablet Take 5 mg by mouth 2 (two) times daily  apoaequorin (PREVAGEN) capsule Take 1 capsule by mouth once daily  blood glucose diagnostic (ONETOUCH ULTRA TEST) test strip TEST 3 TIMES DAILY  cetirizine (ZYRTEC) 10 MG tablet Take by mouth  cholecalciferol (VITAMIN D3) 2,000 unit tablet Take 2,000 Units by mouth once daily  colchicine (COLCRYS) 0.6 mg tablet colchicine 0.6 mg tablet TAKE 1 TABLET BY MOUTH TWICE A DAY AS DIRECTED UNTIL IMPROVED, THEN DAILY  COQ10, LIPOSOMAL UBIQUINOL, ORAL Take by mouth once daily  diclofenac (VOLTAREN) 1 % topical gel Apply 2 g topically 4 (four) times daily  diltiazem (CARDIZEM) 30 MG tablet  diltiazem (TIAZAC) 360 MG ER capsule Take 1 capsule by mouth once daily  ferrous sulfate 325 (65 FE) MG EC tablet Take 1 tablet by mouth once daily  levothyroxine (SYNTHROID) 112 MCG tablet Take 112 mcg by mouth once daily  meclizine (ANTIVERT) 25 mg tablet Take 25 mg by mouth as needed  metFORMIN (GLUCOPHAGE-XR) 500 MG XR tablet Take 1,000 mg by mouth 2 (two) times daily  metoprolol succinate (TOPROL-XL) 100 MG XL tablet Take 100 mg by mouth 2 (two) times daily  multivitamin tablet  Take 1 tablet by mouth once daily  nitroGLYcerin (NITROSTAT) 0.4 MG SL tablet Place under the tongue  omeprazole (PRILOSEC) 40 MG DR capsule Take 40 mg by mouth once daily  rosuvastatin (CRESTOR) 10 MG tablet Take 10 mg by mouth once daily  semaglutide (OZEMPIC) pen injector Inject 1 mg subcutaneously once a week   No current facility-administered medications on file prior to visit.   Family History  Problem Relation Age of Onset  Hyperlipidemia (Elevated cholesterol) Mother  High blood pressure (Hypertension) Mother  Obesity Sister  Arthritis Maternal Grandfather  Arthritis Paternal Grandfather    Social History   Tobacco Use  Smoking Status Never  Smokeless Tobacco Never    Social History   Socioeconomic History  Marital status: Widowed  Occupational History  Occupation: Retired  Tobacco Use  Smoking status: Never  Smokeless tobacco: Never  Vaping Use  Vaping Use: Never used  Substance and Sexual Activity  Alcohol use: Not Currently  Drug use: Never   Objective:   Vitals:  08/22/21 1553  BP: (!) 144/72  Pulse: 74  Temp: 36.2 C (97.1 F)  SpO2: 98%  Weight: 77.3 kg (170 lb 6.4 oz)  Height: 165.1 cm ('5\' 5"'$ )   Body mass index is 28.36 kg/m.  Physical Exam   She appears well on exam  She does have an easily palpable small left temporal artery. 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 I explained the reasonings for this with her. I explained the surgical procedure in detail. We discussed the risks which includes but is not limited to bleeding, infection, injury to surrounding structures, she will stop her Eliquis 2 days preoperatively. Surgery will be scheduled

## 2021-08-30 NOTE — Progress Notes (Signed)
For Short Stay: [facility] appointment date: Date of COVID positive in last 71 days: NO  Bowel Prep reminder:   For Anesthesia: PCP - Dr. Madison Hickman Cardiologist - Dr. [name]. LOV: 04/25/21  Chest x-ray - 11/06/20 EKG - 04/18/21 Stress Test -  ECHO -  Cardiac Cath - 9470,9628 Pacemaker/ICD device last checked: Loop recorder Pacemaker orders received: Device Rep notified:  Spinal Cord Stimulator:  Sleep Study - Yes CPAP - NO  Fasting Blood Sugar - 140's Checks Blood Sugar __4___ times a week Date and result of last Hgb A1c- 6.7: 06/28/21  Blood Thinner Instructions: Eliquis: On hold since: 08/29/21: Dr. Ninfa Linden. Aspirin Instructions: Last Dose:  Activity level: Can go up a flight of stairs and activities of daily living without stopping and without chest pain and/or shortness of breath   Able to exercise without chest pain and/or shortness of breath   Unable to go up a flight of stairs without chest pain and/or shortness of breath     Anesthesia review: Hx: Aflutter,CAD,DIA,HTN,OSA(NO CPAP).  Patient denies shortness of breath, fever, cough and chest pain at PAT appointment   Patient verbalized understanding of instructions that were given to them at the PAT appointment. Patient was also instructed that they will need to review over the PAT instructions again at home before surgery.

## 2021-08-31 ENCOUNTER — Ambulatory Visit (HOSPITAL_BASED_OUTPATIENT_CLINIC_OR_DEPARTMENT_OTHER): Payer: Medicare Other | Admitting: Anesthesiology

## 2021-08-31 ENCOUNTER — Ambulatory Visit (HOSPITAL_COMMUNITY): Payer: Medicare Other | Admitting: Physician Assistant

## 2021-08-31 ENCOUNTER — Other Ambulatory Visit: Payer: Self-pay

## 2021-08-31 ENCOUNTER — Telehealth: Payer: Self-pay | Admitting: Family Medicine

## 2021-08-31 ENCOUNTER — Encounter (HOSPITAL_COMMUNITY)
Admission: RE | Disposition: A | Discharge status: Home / Self Care | Payer: Self-pay | Source: Home / Self Care | Attending: Surgery

## 2021-08-31 ENCOUNTER — Encounter (HOSPITAL_COMMUNITY): Payer: Self-pay | Admitting: Surgery

## 2021-08-31 ENCOUNTER — Ambulatory Visit (HOSPITAL_COMMUNITY)
Admission: RE | Admit: 2021-08-31 | Discharge: 2021-08-31 | Disposition: A | Discharge status: Home / Self Care | Payer: Medicare Other | Attending: Surgery | Admitting: Surgery

## 2021-08-31 DIAGNOSIS — I5032 Chronic diastolic (congestive) heart failure: Secondary | ICD-10-CM

## 2021-08-31 DIAGNOSIS — G4730 Sleep apnea, unspecified: Secondary | ICD-10-CM | POA: Diagnosis not present

## 2021-08-31 DIAGNOSIS — Z794 Long term (current) use of insulin: Secondary | ICD-10-CM | POA: Insufficient documentation

## 2021-08-31 DIAGNOSIS — Z9049 Acquired absence of other specified parts of digestive tract: Secondary | ICD-10-CM | POA: Diagnosis not present

## 2021-08-31 DIAGNOSIS — E1122 Type 2 diabetes mellitus with diabetic chronic kidney disease: Secondary | ICD-10-CM | POA: Diagnosis not present

## 2021-08-31 DIAGNOSIS — R519 Headache, unspecified: Secondary | ICD-10-CM | POA: Insufficient documentation

## 2021-08-31 DIAGNOSIS — N1832 Chronic kidney disease, stage 3b: Secondary | ICD-10-CM

## 2021-08-31 DIAGNOSIS — N183 Chronic kidney disease, stage 3 unspecified: Secondary | ICD-10-CM | POA: Diagnosis not present

## 2021-08-31 DIAGNOSIS — Z7901 Long term (current) use of anticoagulants: Secondary | ICD-10-CM | POA: Diagnosis not present

## 2021-08-31 DIAGNOSIS — E1136 Type 2 diabetes mellitus with diabetic cataract: Secondary | ICD-10-CM | POA: Diagnosis not present

## 2021-08-31 DIAGNOSIS — Z87891 Personal history of nicotine dependence: Secondary | ICD-10-CM | POA: Diagnosis not present

## 2021-08-31 DIAGNOSIS — E039 Hypothyroidism, unspecified: Secondary | ICD-10-CM | POA: Insufficient documentation

## 2021-08-31 DIAGNOSIS — I480 Paroxysmal atrial fibrillation: Secondary | ICD-10-CM | POA: Insufficient documentation

## 2021-08-31 DIAGNOSIS — I2510 Atherosclerotic heart disease of native coronary artery without angina pectoris: Secondary | ICD-10-CM | POA: Diagnosis not present

## 2021-08-31 DIAGNOSIS — Z7984 Long term (current) use of oral hypoglycemic drugs: Secondary | ICD-10-CM | POA: Insufficient documentation

## 2021-08-31 DIAGNOSIS — I130 Hypertensive heart and chronic kidney disease with heart failure and stage 1 through stage 4 chronic kidney disease, or unspecified chronic kidney disease: Secondary | ICD-10-CM | POA: Diagnosis not present

## 2021-08-31 HISTORY — PX: ARTERY BIOPSY: SHX891

## 2021-08-31 LAB — GLUCOSE, CAPILLARY
Glucose-Capillary: 417 mg/dL — ABNORMAL HIGH (ref 70–99)
Glucose-Capillary: 412 mg/dL — ABNORMAL HIGH (ref 70–99)
Glucose-Capillary: 330 mg/dL — ABNORMAL HIGH (ref 70–99)
Glucose-Capillary: 274 mg/dL — ABNORMAL HIGH (ref 70–99)

## 2021-08-31 SURGERY — BIOPSY TEMPORAL ARTERY
Anesthesia: Monitor Anesthesia Care

## 2021-08-31 MED ORDER — LIDOCAINE HCL (PF) 1 % IJ SOLN
INTRAMUSCULAR | Status: DC | PRN
  Administered 2021-08-31: 2 mL

## 2021-08-31 MED ORDER — INSULIN ASPART 100 UNIT/ML IJ SOLN
10.0000 [IU] | Freq: Once | INTRAMUSCULAR | Status: AC
  Administered 2021-08-31: 10 [IU] via SUBCUTANEOUS
  Filled 2021-08-31: qty 1

## 2021-08-31 MED ORDER — INSULIN ASPART 100 UNIT/ML IJ SOLN
INTRAMUSCULAR | Status: AC
  Filled 2021-08-31: qty 1

## 2021-08-31 MED ORDER — FENTANYL CITRATE (PF) 100 MCG/2ML IJ SOLN
INTRAMUSCULAR | Status: AC
  Filled 2021-08-31: qty 2

## 2021-08-31 MED ORDER — FENTANYL CITRATE (PF) 250 MCG/5ML IJ SOLN
INTRAMUSCULAR | Status: DC | PRN
  Administered 2021-08-31 (×4): 25 ug via INTRAVENOUS

## 2021-08-31 MED ORDER — FENTANYL CITRATE PF 50 MCG/ML IJ SOSY
25.0000 ug | PREFILLED_SYRINGE | INTRAMUSCULAR | Status: DC | PRN
  Administered 2021-08-31 (×2): 25 ug via INTRAVENOUS

## 2021-08-31 MED ORDER — CHLORHEXIDINE GLUCONATE CLOTH 2 % EX PADS: 6.0000 | MEDICATED_PAD | Freq: Once | CUTANEOUS | Status: DC

## 2021-08-31 MED ORDER — FENTANYL CITRATE PF 50 MCG/ML IJ SOSY
PREFILLED_SYRINGE | INTRAMUSCULAR | Status: AC
  Filled 2021-08-31: qty 1

## 2021-08-31 MED ORDER — PROPOFOL 10 MG/ML IV BOLUS
INTRAVENOUS | Status: DC | PRN
  Administered 2021-08-31: 35 mg via INTRAVENOUS
  Administered 2021-08-31: 15 mg via INTRAVENOUS
  Administered 2021-08-31: 10 mg via INTRAVENOUS

## 2021-08-31 MED ORDER — ORAL CARE MOUTH RINSE: 15.0000 mL | Freq: Once | OROMUCOSAL | Status: AC

## 2021-08-31 MED ORDER — INSULIN ASPART 100 UNIT/ML IJ SOLN
10.0000 [IU] | Freq: Once | INTRAMUSCULAR | Status: AC
Start: 2021-08-31 — End: 2021-08-31
  Administered 2021-08-31: 10 [IU] via SUBCUTANEOUS

## 2021-08-31 MED ORDER — ONDANSETRON HCL 4 MG/2ML IJ SOLN
INTRAMUSCULAR | Status: DC | PRN
  Administered 2021-08-31: 4 mg via INTRAVENOUS

## 2021-08-31 MED ORDER — PROPOFOL 500 MG/50ML IV EMUL
INTRAVENOUS | Status: DC | PRN
  Administered 2021-08-31: 50 ug/kg/min via INTRAVENOUS

## 2021-08-31 MED ORDER — OXYCODONE-ACETAMINOPHEN 5-325 MG PO TABS: 1.0000 | ORAL_TABLET | Freq: Four times a day (QID) | ORAL | 0 refills | Status: DC | PRN

## 2021-08-31 MED ORDER — LIDOCAINE 2% (20 MG/ML) 5 ML SYRINGE
INTRAMUSCULAR | Status: DC | PRN
  Administered 2021-08-31: 60 mg via INTRAVENOUS

## 2021-08-31 MED ORDER — ACETAMINOPHEN 500 MG PO TABS
1000.0000 mg | ORAL_TABLET | ORAL | Status: AC
  Administered 2021-08-31: 1000 mg via ORAL
  Filled 2021-08-31: qty 2

## 2021-08-31 MED ORDER — CHLORHEXIDINE GLUCONATE 0.12 % MT SOLN
15.0000 mL | Freq: Once | OROMUCOSAL | Status: AC
  Administered 2021-08-31: 15 mL via OROMUCOSAL

## 2021-08-31 MED ORDER — LACTATED RINGERS IV SOLN: INTRAVENOUS | Status: DC

## 2021-08-31 MED ORDER — CIPROFLOXACIN IN D5W 400 MG/200ML IV SOLN
400.0000 mg | INTRAVENOUS | Status: AC
  Administered 2021-08-31: 400 mg via INTRAVENOUS
  Filled 2021-08-31: qty 200

## 2021-08-31 MED ORDER — LIDOCAINE HCL (PF) 1 % IJ SOLN
INTRAMUSCULAR | Status: AC
  Filled 2021-08-31: qty 30

## 2021-08-31 MED ORDER — INSULIN ASPART 100 UNIT/ML IJ SOLN
10.0000 [IU] | INTRAMUSCULAR | Status: AC
  Administered 2021-08-31: 10 [IU] via SUBCUTANEOUS

## 2021-08-31 MED ORDER — 0.9 % SODIUM CHLORIDE (POUR BTL) OPTIME
TOPICAL | Status: DC | PRN
  Administered 2021-08-31: 1000 mL

## 2021-08-31 SURGICAL SUPPLY — 22 items
BAG COUNTER SPONGE SURGICOUNT (BAG) IMPLANT
BLADE SURG 15 STRL LF DISP TIS (BLADE) IMPLANT
BLADE SURG 15 STRL SS (BLADE) ×2
DERMABOND ADVANCED (GAUZE/BANDAGES/DRESSINGS) ×2
DERMABOND ADVANCED .7 DNX12 (GAUZE/BANDAGES/DRESSINGS) IMPLANT
DRAPE LAPAROTOMY T 98X78 PEDS (DRAPES) ×1 IMPLANT
GAUZE 4X4 16PLY ~~LOC~~+RFID DBL (SPONGE) ×1 IMPLANT
GLOVE BIO SURGEON STRL SZ7.5 (GLOVE) ×2 IMPLANT
GLOVE BIOGEL PI IND STRL 7.0 (GLOVE) ×1 IMPLANT
GLOVE BIOGEL PI INDICATOR 7.0 (GLOVE) ×1
GOWN STRL REUS W/ TWL XL LVL3 (GOWN DISPOSABLE) ×2 IMPLANT
GOWN STRL REUS W/TWL XL LVL3 (GOWN DISPOSABLE) ×6
KIT TURNOVER KIT A (KITS) ×1 IMPLANT
NDL HYPO 25X1 1.5 SAFETY (NEEDLE) IMPLANT
NEEDLE HYPO 25X1 1.5 SAFETY (NEEDLE) ×2 IMPLANT
PENCIL SMOKE EVACUATOR (MISCELLANEOUS) ×1 IMPLANT
SUT MNCRL AB 4-0 PS2 18 (SUTURE) ×1 IMPLANT
SUT SILK 2 0 SH (SUTURE) ×2 IMPLANT
SUT VIC AB 3-0 SH 27 (SUTURE) ×2
SUT VIC AB 3-0 SH 27X BRD (SUTURE) IMPLANT
SYR CONTROL 10ML LL (SYRINGE) ×1 IMPLANT
TOWEL OR 17X26 10 PK STRL BLUE (TOWEL DISPOSABLE) ×2 IMPLANT

## 2021-08-31 NOTE — Op Note (Signed)
LEFT TEMPORAL ARTERY BIOPSY  Procedure Note  Courtney Pena 08/31/2021   Pre-op Diagnosis: left side headache, rule out temporal arteritis     Post-op Diagnosis: same  Procedure(s): LEFT TEMPORAL ARTERY BIOPSY  Surgeon(s): Coralie Keens, MD  Anesthesia: Monitor Anesthesia Care  Staff:  Circulator: Stana Bunting, RN Scrub Person: Ophelia Charter, CST  Estimated Blood Loss: Minimal               Specimens: sent to path  Procedure: The patient was brought to operating identifies the correct patient.  She is placed upon the operating table and anesthesia was induced.  The left side of her head was then prepped and draped in the usual sterile fashion.  I anesthetized the skin just in front of the left ear with lidocaine.  I then made incision with a scalpel.  I then dissected down through the subcutaneous tissue and fascia with electrocautery.  I placed a small retractor and then was able to easily identify the temporal artery.  I controlled the branches superior and inferior with hemostats and then transected the vessel with a scalpel.  I then sent the specimen to pathology for evaluation.  We then controlled all ends of the vessels with 3-0 silk ties.  One 3-0 silk suture was also used.  Hemostasis appeared to be achieved.  I then closed the subcutaneous tissue with interrupted 3-0 Vicryl sutures and closed the skin with a running 4-0 Monocryl.  Dermabond was then applied.  The patient tolerated the procedure well.  All the counts were correct at the end of the procedure.  The patient was then taken in stable condition from the operating room to the recovery room.          Coralie Keens   Date: 08/31/2021  Time: 12:13 PM

## 2021-08-31 NOTE — Telephone Encounter (Signed)
Called patient to check in on her after temporal artery bx today. She feels improved. She did note her blood sugars were elevated today and she required insulin pre-operatively. I also received a note from Dr. Ninfa Linden that her sugar was greater than 400. She reported an episode of hypoglycemia following, sugar was in the high 60's. She is now feeling improved, most recent CBG 108. She is taking her 60 mg of prednisone still.   I did discuss with her a steroid taper that she can start as this is likely cause of elevated CBG. I suspect biopsy results should be back in the next couple days. Advised her to wean by 10 mg daily until she is at 20 mg and keep on the 20 mg until she hears from myself or PCP regarding biopsy results.   I have scheduled her a follow up with her PCP for 7/17 to discuss how she is doing post-operatively and to follow up on her headaches. If the biopsy is negative and she is continuing to have worsening headaches, it may be reasonable to pursue imaging.   Forwarding to PCP as FYI.

## 2021-08-31 NOTE — Interval H&P Note (Signed)
History and Physical Interval Note: no change in H and P  08/31/2021 11:17 AM  Courtney Pena  has presented today for surgery, with the diagnosis of left side headache.  The various methods of treatment have been discussed with the patient and family. After consideration of risks, benefits and other options for treatment, the patient has consented to  Procedure(s): LEFT TEMPORAL ARTERY BIOPSY (N/A) as a surgical intervention.  The patient's history has been reviewed, patient examined, no change in status, stable for surgery.  I have reviewed the patient's chart and labs.  Questions were answered to the patient's satisfaction.     Coralie Keens

## 2021-08-31 NOTE — Anesthesia Postprocedure Evaluation (Signed)
Anesthesia Post Note  Patient: Courtney Pena  Procedure(s) Performed: LEFT TEMPORAL ARTERY BIOPSY     Patient location during evaluation: PACU Anesthesia Type: MAC Level of consciousness: awake and alert Pain management: pain level controlled Vital Signs Assessment: post-procedure vital signs reviewed and stable Respiratory status: spontaneous breathing, nonlabored ventilation, respiratory function stable and patient connected to nasal cannula oxygen Cardiovascular status: stable and blood pressure returned to baseline Postop Assessment: no apparent nausea or vomiting Anesthetic complications: no   No notable events documented.  Last Vitals:  Vitals:   08/31/21 1250 08/31/21 1305  BP: 140/75 136/71  Pulse: (!) 57 (!) 57  Resp: (!) 9 (!) 9  Temp:  (!) 36.4 C  SpO2: 94% 94%    Last Pain:  Vitals:   08/31/21 1305  TempSrc:   PainSc: 0-No pain                 Belenda Cruise P Danity Schmelzer

## 2021-08-31 NOTE — Anesthesia Preprocedure Evaluation (Signed)
Anesthesia Evaluation  Patient identified by MRN, date of birth, ID band Patient awake    Reviewed: Allergy & Precautions, NPO status , Patient's Chart, lab work & pertinent test results  Airway Mallampati: II  TM Distance: >3 FB     Dental no notable dental hx.    Pulmonary sleep apnea , former smoker,    Pulmonary exam normal        Cardiovascular hypertension, Pt. on medications and Pt. on home beta blockers + CAD and +CHF  + dysrhythmias Atrial Fibrillation  Rhythm:Irregular Rate:Normal     Neuro/Psych  Headaches, negative psych ROS   GI/Hepatic Neg liver ROS, GERD  Medicated,  Endo/Other  diabetes, Poorly Controlled, Type 2, Insulin Dependent, Oral Hypoglycemic AgentsHypothyroidism   Renal/GU CRFRenal disease  negative genitourinary   Musculoskeletal  (+) Arthritis , Osteoarthritis,    Abdominal Normal abdominal exam  (+)   Peds  Hematology  (+) Blood dyscrasia, anemia ,   Anesthesia Other Findings   Reproductive/Obstetrics                             Anesthesia Physical Anesthesia Plan  ASA: 3  Anesthesia Plan: MAC   Post-op Pain Management:    Induction: Intravenous  PONV Risk Score and Plan: 2 and Ondansetron, Dexamethasone, Propofol infusion and Treatment may vary due to age or medical condition  Airway Management Planned: Simple Face Mask, Natural Airway and Nasal Cannula  Additional Equipment: None  Intra-op Plan:   Post-operative Plan:   Informed Consent: I have reviewed the patients History and Physical, chart, labs and discussed the procedure including the risks, benefits and alternatives for the proposed anesthesia with the patient or authorized representative who has indicated his/her understanding and acceptance.     Dental advisory given  Plan Discussed with: CRNA  Anesthesia Plan Comments:         Anesthesia Quick Evaluation

## 2021-08-31 NOTE — Transfer of Care (Signed)
Immediate Anesthesia Transfer of Care Note  Patient: Courtney Pena  Procedure(s) Performed: LEFT TEMPORAL ARTERY BIOPSY  Patient Location: PACU  Anesthesia Type:MAC  Level of Consciousness: awake, alert , oriented and patient cooperative  Airway & Oxygen Therapy: Patient Spontanous Breathing and Patient connected to face mask oxygen  Post-op Assessment: Report given to RN and Post -op Vital signs reviewed and stable  Post vital signs: Reviewed and stable  Last Vitals:  Vitals Value Taken Time  BP 135/76 08/31/21 1217  Temp    Pulse 60 08/31/21 1218  Resp 13 08/31/21 1218  SpO2 100 % 08/31/21 1218  Vitals shown include unvalidated device data.  Last Pain:  Vitals:   08/31/21 0958  TempSrc:   PainSc: 0-No pain         Complications: No notable events documented.

## 2021-08-31 NOTE — Discharge Instructions (Signed)
You may shower starting tomorrow including shampooing her hair  Ice pack and Tylenol for pain  No vigorous activity for 1 week

## 2021-09-01 ENCOUNTER — Encounter (HOSPITAL_COMMUNITY): Payer: Self-pay | Admitting: Surgery

## 2021-09-01 LAB — SURGICAL PATHOLOGY

## 2021-09-01 NOTE — Telephone Encounter (Signed)
I called patient and discussed.  She is better today.  Slept well last night with good blood sugars.  Agree with steroid taper.  Likely stop provided biopsy comes back negative.  Encouraged to drink plenty of fluids through weekend.

## 2021-09-11 ENCOUNTER — Ambulatory Visit: Payer: Medicare Other | Admitting: Family Medicine

## 2021-09-11 ENCOUNTER — Ambulatory Visit (INDEPENDENT_AMBULATORY_CARE_PROVIDER_SITE_OTHER): Payer: Medicare Other

## 2021-09-11 ENCOUNTER — Encounter: Payer: Self-pay | Admitting: Family Medicine

## 2021-09-11 DIAGNOSIS — M1A09X0 Idiopathic chronic gout, multiple sites, without tophus (tophi): Secondary | ICD-10-CM

## 2021-09-11 DIAGNOSIS — E039 Hypothyroidism, unspecified: Secondary | ICD-10-CM

## 2021-09-11 DIAGNOSIS — I480 Paroxysmal atrial fibrillation: Secondary | ICD-10-CM | POA: Diagnosis not present

## 2021-09-11 DIAGNOSIS — R519 Headache, unspecified: Secondary | ICD-10-CM | POA: Diagnosis not present

## 2021-09-11 DIAGNOSIS — E1140 Type 2 diabetes mellitus with diabetic neuropathy, unspecified: Secondary | ICD-10-CM

## 2021-09-11 DIAGNOSIS — I10 Essential (primary) hypertension: Secondary | ICD-10-CM | POA: Diagnosis not present

## 2021-09-11 DIAGNOSIS — G8929 Other chronic pain: Secondary | ICD-10-CM

## 2021-09-11 NOTE — Patient Instructions (Addendum)
No changes to your diabetes medicines.  You were under good control before we messed you up with the prednisone.  I expect you to go back to good control now that you are off the prednisone. See me in one month.  We will do blood work at that time. Stop the gabapentin, allopurinol and duloxetine.  You don't need them. Go ahead and get the shingles vaccine at the pharmacy.  Two shots.  Two to six months apart. Hold off on the Covid booster till the fall.  We will see if new booster.  You will also need a flu shot in the fall.  And there is a brand new RSV vaccine that you will need.

## 2021-09-12 ENCOUNTER — Encounter: Payer: Self-pay | Admitting: Family Medicine

## 2021-09-12 ENCOUNTER — Telehealth: Payer: Self-pay

## 2021-09-12 NOTE — Assessment & Plan Note (Signed)
Continue current diabetic meds.  Anticipate return to good control now off steroids.  Stop cymbalta and gabapentin since minimal relief.  Educated that meds only treat dysathesias, not numbness.

## 2021-09-12 NOTE — Assessment & Plan Note (Signed)
Needs TSH.  We discussed and plan blood work next visit in one month.

## 2021-09-12 NOTE — Assessment & Plan Note (Signed)
She is satisfied with PRN colchicine treatment for now.  Removed allopurinol from med list.

## 2021-09-12 NOTE — Telephone Encounter (Signed)
Returned pt's call.  Pt was just requesting novo nordisk info for a friend

## 2021-09-12 NOTE — Progress Notes (Signed)
    SUBJECTIVE:   CHIEF COMPLAINT / HPI:   Multiple issues 1. HA concern for temporal arteritis.  Given steroids.  Bx done and negative.  Now off steroids.  Headache is less and manageable. 2. DM.  Was in great control prior to steroids.  While on steroids, BS increased to 400 range.  Now off steroids and FBS are generally below 200.   3. Neuropathy seems stable.  She could not tolerate her gabapentin.  Low dose cymbalta without benefit.   4. Gout. Only taking prn cochicine.  Not on allopurinol.  She thinks that she never filled the RX. 5. Asks about thyroid.  Problems run in the family and she has treated hypothyroidism.  She has no unintentional weight loss or gain.  No heat or cold intolerance.    OBJECTIVE:   BP 128/68 (BP Location: Left Arm, Cuff Size: Large)   Pulse 73   Ht '5\' 5"'$  (1.651 m)   Wt 171 lb 12.8 oz (77.9 kg)   SpO2 100%   BMI 28.59 kg/m   Wt down and good BP noted.   Left temporal bx site healing well. Neck supple without thyromegally Lungs clear Cardiac RRR without m or g  Cardiac RRR without m or g  ASSESSMENT/PLAN:   Idiopathic chronic gout of multiple sites without tophus She is satisfied with PRN colchicine treatment for now.  Removed allopurinol from med list.  HYPERTENSION, BENIGN SYSTEMIC Good control on current meds.  Headache Improved and manageable.  No further WU at this time.  Type 2 diabetes mellitus with diabetic neuropathy, unspecified ([place]) Continue current diabetic meds.  Anticipate return to good control now off steroids.  Stop cymbalta and gabapentin since minimal relief.  Educated that meds only treat dysathesias, not numbness.  Hypothyroidism Needs TSH.  We discussed and plan blood work next visit in one month.     [name], MD [place]

## 2021-09-12 NOTE — Assessment & Plan Note (Signed)
>>  ASSESSMENT AND PLAN FOR TYPE 2 DIABETES MELLITUS WITH DIABETIC NEUROPATHY, UNSPECIFIED (HCC) WRITTEN ON 09/12/2021 10:45 AM BY HENSEL, Santiago Bumpers, MD  Continue current diabetic meds.  Anticipate return to good control now off steroids.  Stop cymbalta and gabapentin since minimal relief.  Educated that meds only treat dysathesias, not numbness.

## 2021-09-12 NOTE — Assessment & Plan Note (Signed)
Good control on current meds. 

## 2021-09-12 NOTE — Assessment & Plan Note (Signed)
Improved and manageable.  No further WU at this time.

## 2021-09-13 LAB — CUP PACEART REMOTE DEVICE CHECK
Date Time Interrogation Session: 20230715230111
Implantable Pulse Generator Implant Date: 20220525

## 2021-09-18 ENCOUNTER — Ambulatory Visit: Payer: Medicare Other | Admitting: Cardiology

## 2021-09-18 ENCOUNTER — Encounter: Payer: Self-pay | Admitting: Cardiology

## 2021-09-18 VITALS — BP 116/74 | HR 66 | Ht 65.0 in | Wt 167.6 lb

## 2021-09-18 DIAGNOSIS — D6869 Other thrombophilia: Secondary | ICD-10-CM | POA: Diagnosis not present

## 2021-09-18 DIAGNOSIS — I480 Paroxysmal atrial fibrillation: Secondary | ICD-10-CM

## 2021-09-18 NOTE — Progress Notes (Signed)
Electrophysiology Office Note   Date:  09/18/2021   ID:  Courtney Pena, DOB 28-Jul-1940, MRN [id number]  PCP:  [name], MD  Cardiologist:  Johnsie Cancel Primary Electrophysiologist:  Etheridge Geil Meredith Leeds, MD    Chief Complaint: AF   History of Present Illness: Courtney Pena is a 81 y.o. female who is being seen today for the evaluation of AF at the request of Hensel, Jamal Collin, MD. Presenting today for electrophysiology evaluation.  She has a history significant for diabetes, hypertension, hyperlipidemia, obesity, atrial flutter.  Linq monitor implanted.  She overall feels well.  She feels that she has not had much atrial fibrillation or atrial flutter.  Her last episode on her Linq monitor is from December 2022.  She does feel that she has had episodes since then, but they have not been documented on her Linq monitor.  Today, she denies symptoms of palpitations, chest pain, shortness of breath, orthopnea, PND, lower extremity edema, claudication, dizziness, presyncope, syncope, bleeding, or neurologic sequela. The patient is tolerating medications without difficulties.    Past Medical History:  Diagnosis Date   Allergy    Arthritis    Atrial flutter ([place]) 12/07/2010   converted in ED with 300 mg flecainide   CAD (coronary artery disease)    a. mild per cath in 2004;  b. nonischemic Myoview in March 2012;  c. Lex MV 1/14:  EF 66%, no ischemia   Cataract    Dysrhythmia    A-Fib. has a loop recorder   External hemorrhoids 06/07/2010   Family history of adverse reaction to anesthesia    Daughter has severe N&V   Gastric antral vascular ectasia    source for gi bleed in 07/2011 - Xarelto stopped   GERD (gastroesophageal reflux disease)    Gout    Hyperlipidemia    Hypertension    Hypothyroidism    Neuromuscular disorder ([place])    neuropathy in feet   Obesity    Personal history of colonic polyps 06/06/2009   cecal polyp   Rheumatoid arthritis ([place])     Shingles 2023   Sleep apnea    lost weight no longer needs   Type 2 diabetes mellitus with diabetic chronic kidney disease ([place]) 04/25/2006       Past Surgical History:  Procedure Laterality Date   ARTERY BIOPSY N/A 08/31/2021   Procedure: LEFT TEMPORAL ARTERY BIOPSY;  Surgeon: Coralie Keens, MD;  Location: [facility];  Service: General;  Laterality: N/A;   BREAST BIOPSY Left    BREAST EXCISIONAL BIOPSY Left    benign   CARDIAC CATHETERIZATION  1999&2004   CARPAL TUNNEL RELEASE Bilateral 2003   CATARACT EXTRACTION Bilateral 1990   CHOLECYSTECTOMY N/A 07/05/2021   Procedure: LAPAROSCOPIC CHOLECYSTECTOMY;  Surgeon: Felicie Morn, MD;  Location: [facility];  Service: General;  Laterality: N/A;   COLONOSCOPY     ESOPHAGOGASTRODUODENOSCOPY  08/13/2011   Procedure: ESOPHAGOGASTRODUODENOSCOPY (EGD);  Surgeon: Gatha Mayer, MD;  Location: [facility];  Service: Endoscopy;  Laterality: N/A;   implantable loop recorder placement  07/20/2020   Medtronic Reveal Linq model LNQ 22 ([place] [id number]) implantable loop recorder by Dr Rayann Heman     Current Outpatient Medications  Medication Sig Dispense Refill   acetaminophen (TYLENOL) 650 MG CR tablet Take 650 mg by mouth every 8 (eight) hours as needed for pain.     apixaban (ELIQUIS) 5 MG TABS tablet Take 1 tablet (5 mg total) by mouth 2 (two) times daily. 42 tablet  0   Apoaequorin (PREVAGEN PO) Take 1 tablet by mouth daily.     blood glucose meter kit and supplies Dispense based on patient and insurance preference. Use up to four times daily as directed. (FOR ICD-10 E10.9, E11.9). 1 each 0   cetirizine (ZYRTEC) 10 MG tablet Take 10 mg by mouth daily as needed for allergies.     Cholecalciferol (VITAMIN D3) 2000 units TABS Take 4,000 Units by mouth daily.     Coenzyme Q10 (COQ10) 100 MG CAPS Take 100 mg by mouth daily.     colchicine 0.6 MG tablet Take 1 tablet (0.6 mg total) by mouth as needed. 30 tablet 6   diclofenac Sodium (VOLTAREN) 1 % GEL  Apply 2 g topically as needed (pain). 350 g 3   diltiazem (CARDIZEM CD) 360 MG 24 hr capsule TAKE 1 CAPSULE BY MOUTH  DAILY 90 capsule 3   diltiazem (CARDIZEM) 30 MG tablet Take 1 tablet (30 mg total) by mouth every 4 (four) hours as needed (elevated HR as long as BP > 100). 30 tablet 6   ferrous sulfate 325 (65 FE) MG EC tablet Take 650 mg by mouth daily.      glucose blood (ONETOUCH ULTRA) test strip 1 Container by Other route every morning.     Insulin Pen Needle 31G X 8 MM MISC Use with pen to inject once daily. 100 each 3   Lancets (ONETOUCH ULTRASOFT) lancets Use to check blood sugar daily. 100 each 3   levothyroxine (SYNTHROID) 112 MCG tablet TAKE 1 TABLET BY MOUTH DAILY  BEFORE BREAKFAST 90 tablet 3   meclizine (ANTIVERT) 25 MG tablet TAKE 1 TABLET (25 MG TOTAL) BY MOUTH AS NEEDED. 30 tablet 3   metFORMIN (GLUCOPHAGE-XR) 500 MG 24 hr tablet TAKE 4 TABLETS BY MOUTH  DAILY 360 tablet 3   metoprolol succinate (TOPROL-XL) 100 MG 24 hr tablet TAKE 1 TABLET BY MOUTH  TWICE DAILY WITH OR  IMMEDIATELY FOLLOWING MEALS 180 tablet 3   Multiple Vitamin (MULTI-VITAMINS) TABS Take 1 tablet by mouth daily.     nitroGLYCERIN (NITROSTAT) 0.4 MG SL tablet Place 1 tablet (0.4 mg total) under the tongue every 5 (five) minutes as needed for chest pain (Call 911 if chest pain after three doses). 25 tablet 3   omeprazole (PRILOSEC) 40 MG capsule TAKE 1 CAPSULE BY MOUTH  DAILY 90 capsule 3   ONETOUCH ULTRA test strip USE TO TEST BLOOD SUGAR 3  TIMES DAILY 300 strip 3   oxyCODONE-acetaminophen (PERCOCET) 5-325 MG tablet Take 1 tablet by mouth every 6 (six) hours as needed for severe pain or moderate pain. 15 tablet 0   predniSONE (DELTASONE) 20 MG tablet Take 60 mg by mouth daily with breakfast. Patient takes three (3) prednisone 13m tabs a day     rosuvastatin (CRESTOR) 10 MG tablet TAKE 1 TABLET BY MOUTH  DAILY 90 tablet 3   Semaglutide, 1 MG/DOSE, 2 MG/1.5ML SOPN Inject 1 mg into the skin once a week. Patient  taking 01.6XW(by reducing clicks) (Patient taking differently: Inject 1 mg into the skin once a week.)     triamcinolone cream (KENALOG) 0.1 % Apply 1 application. topically daily as needed (dermatitis).     No current facility-administered medications for this visit.    Allergies:   Actos [pioglitazone], Lisinopril, Nsaids, Penicillins, Sulfamethoxazole, Flecainide, Hctz [hydrochlorothiazide], Iodinated contrast media, Statins, Gabapentin, Losartan potassium, and Zetia [ezetimibe]   Social History:  The patient  reports that she quit smoking about  28 years ago. Her smoking use included cigarettes. She has a 60.00 pack-year smoking history. She has never used smokeless tobacco. She reports that she does not drink alcohol and does not use drugs.   Family History:  The patient's family history includes Colon polyps in her daughter; Diabetes in her daughter and maternal grandfather; Heart disease in her father; Hypertension in her mother; Other in her mother; Thyroid disease in her daughter.    ROS:  Please see the history of present illness.   Otherwise, review of systems is positive for none.   All other systems are reviewed and negative.    PHYSICAL EXAM: VS:  BP 116/74   Pulse 66   Ht _0  (1.651 m)   Wt 167 lb 9.6 oz (76 kg)   SpO2 95%   BMI 27.89 kg/m  , BMI Body mass index is 27.89 kg/m. GEN: Well nourished, well developed, in no acute distress  HEENT: normal  Neck: no JVD, carotid bruits, or masses Cardiac: RRR; no murmurs, rubs, or gallops,no edema  Respiratory:  clear to auscultation bilaterally, normal work of breathing GI: soft, nontender, nondistended, + BS MS: no deformity or atrophy  Skin: warm and dry, device pocket is well healed Neuro:  Strength and sensation are intact Psych: euthymic mood, full affect  EKG:  EKG is ordered today. Personal review of the ekg ordered shows rhythm, 66  Device interrogation is reviewed today in detail.  See PaceArt for  details.   Recent Labs: 03/21/2021: ALT 10 08/30/2021: BUN 27; Creatinine, Ser 1.34; Hemoglobin 11.5; Platelets 223; Potassium 4.9; Sodium 136    Lipid Panel     Component Value Date/Time   CHOL 144 07/14/2020 1121   TRIG 77 07/14/2020 1121   HDL 60 07/14/2020 1121   CHOLHDL 2.4 07/14/2020 1121   CHOLHDL 4.7 01/27/2015 1238   VLDL 28 01/27/2015 1238   LDLCALC 69 07/14/2020 1121   LDLDIRECT 62 11/24/2018 1211   LDLDIRECT 178 (H) 05/12/2015 1229     Wt Readings from Last 3 Encounters:  09/18/21 167 lb 9.6 oz (76 kg)  09/11/21 171 lb 12.8 oz (77.9 kg)  08/31/21 171 lb 3.2 oz (77.7 kg)      Other studies Reviewed: Additional studies/ records that were reviewed today include: TTE 12/18/19  Review of the above records today demonstrates:   1. Left ventricular ejection fraction, by estimation, is 65 to 70%. The  left ventricle has normal function. The left ventricle has no regional  wall motion abnormalities. There is mild left ventricular hypertrophy.  Left ventricular diastolic parameters  are indeterminate. Elevated left atrial pressure.   2. Right ventricular systolic function is normal. The right ventricular  size is normal.   3. A small pericardial effusion is present. There is no evidence of  cardiac tamponade.   4. The mitral valve is degenerative. Trivial mitral valve regurgitation.  No evidence of mitral stenosis.   5. The aortic valve has an indeterminant number of cusps. Aortic valve  regurgitation is not visualized. No aortic stenosis is present.   6. The inferior vena cava is normal in size with greater than 50%  respiratory variability, suggesting right atrial pressure of 3 mmHg.    ASSESSMENT AND PLAN:  1.  1.  Paroxysmal atrial fibrillation/flutter: CHA2DS2-VASc of 5.  Currently on Eliquis 5 mg twice daily.  Burden is 0%, though she does feel that she has had episodes.  We Baylyn Sickles make a change to her Linq monitor to pick  up episodes that are 150 bpm or higher.   She would be a candidate for Multaq, Tikosyn, or ablation if she has high burden.  2.  Obstructive sleep apnea: CPAP compliance encouraged  3.  Hypertension: Currently well controlled  4.  Secondary hypercoagulable state: Currently on Eliquis for atrial fibrillation as above  5.  Obesity: Diet and exercise encouraged Body mass index is 27.89 kg/m.   Current medicines are reviewed at length with the patient today.   The patient does not have concerns regarding her medicines.  The following changes were made today:  none  Labs/ tests ordered today include:  Orders Placed This Encounter  Procedures   EKG 12-Lead     Disposition:   FU 6 months  Signed, Clarkson Rosselli Meredith Leeds, MD  09/18/2021 4:33 PM     [address] [place] [address] [phone] (office) [phone] (fax)

## 2021-09-18 NOTE — Patient Instructions (Signed)
Medication Instructions:  Your physician recommends that you continue on your current medications as directed. Please refer to the Current Medication list given to you today.  *If you need a refill on your cardiac medications before your next appointment, please call your pharmacy*   Lab Work: None ordered  Testing/Procedures: None ordered   Follow-Up: At [facility], you and your health needs are our priority.  As part of our continuing mission to provide you with exceptional heart care, we have created designated Provider Care Teams.  These Care Teams include your primary Cardiologist (physician) and Advanced Practice Providers (APPs -  Physician Assistants and Nurse Practitioners) who all work together to provide you with the care you need, when you need it.  Your next appointment:   6 month(s)  The format for your next appointment:   In Person  Provider:   You will follow up in the Atrial Fibrillation Clinic located at [facility]. Your provider will be: Donna Carroll, NP or Clint R. Fenton, PA-C    Thank you for choosing [facility]!!   Kaela Beitz, RN [phone]  Other Instructions    Important Information About Sugar           

## 2021-10-09 NOTE — Progress Notes (Signed)
[facility] Summary Report / Loop Recorder 

## 2021-10-10 ENCOUNTER — Telehealth: Payer: Self-pay

## 2021-10-10 NOTE — Telephone Encounter (Signed)
Informed pt she has medication ready for pickup. Pt can pickup on Thursday (10/12/21).  4 boxes of ozempic are ready & labeled for pickup in med room fridge ('1mg'$  dose)

## 2021-10-11 NOTE — Progress Notes (Deleted)
Office Visit    Patient Name: Courtney Pena Date of Encounter: 10/11/2021  PCP:  [name], MD   [facility]  Cardiologist:  [name], MD  Advanced Practice Provider:  No care team member to display Electrophysiologist: [place]  Chief Complaint    Courtney Pena is a 81 y.o. female with a hx of DM, hypertension, hyperlipidemia, obesity, aflutter (off xarelto 2013 secondary to UGIB now on Eliquis), CHADS2VASC of 5 presents today for pre-op clearance for a lap chole.    She has ILR in place with low PAF burden. She has been intolerant to flecainide She had is on  Eliquis and cardizem She had some bleeding issues with xarelto in past. She continues to have some PAF once during coloscopy and once with steroid injection They can last 3-4 hours then she converts No CAD with normal myovue EF 66% 05/10/20 TTE 12/18/19 EF 65-70% mild LAE 4.2 cm no valve dx.    She has had biliary cholic with positive NM hepato scan no emptying with fatty meal  Had lap chole Dr Felicie Morn on 08/31/21 no issues holding eliquis  Seen by [facility] 09/18/21 and since she was not having significant burden PAF no AAT Or ablation recommended Last episode 01/2021   ***   Past Medical History    Past Medical History:  Diagnosis Date   Allergy    Arthritis    Atrial flutter ([place]) 12/07/2010   converted in ED with 300 mg flecainide   CAD (coronary artery disease)    a. mild per cath in 2004;  b. nonischemic Myoview in March 2012;  c. Lex MV 1/14:  EF 66%, no ischemia   Cataract    Dysrhythmia    A-Fib. has a loop recorder   External hemorrhoids 06/07/2010   Family history of adverse reaction to anesthesia    Daughter has severe N&V   Gastric antral vascular ectasia    source for gi bleed in 07/2011 - Xarelto stopped   GERD (gastroesophageal reflux disease)    Gout    Hyperlipidemia    Hypertension    Hypothyroidism    Neuromuscular disorder ([place])     neuropathy in feet   Obesity    Personal history of colonic polyps 06/06/2009   cecal polyp   Rheumatoid arthritis ([place])    Shingles 2023   Sleep apnea    lost weight no longer needs   Type 2 diabetes mellitus with diabetic chronic kidney disease ([place]) 04/25/2006       Past Surgical History:  Procedure Laterality Date   ARTERY BIOPSY N/A 08/31/2021   Procedure: LEFT TEMPORAL ARTERY BIOPSY;  Surgeon: Coralie Keens, MD;  Location: [facility];  Service: General;  Laterality: N/A;   BREAST BIOPSY Left    BREAST EXCISIONAL BIOPSY Left    benign   CARDIAC CATHETERIZATION  1999&2004   CARPAL TUNNEL RELEASE Bilateral 2003   CATARACT EXTRACTION Bilateral 1990   CHOLECYSTECTOMY N/A 07/05/2021   Procedure: LAPAROSCOPIC CHOLECYSTECTOMY;  Surgeon: Felicie Morn, MD;  Location: [facility];  Service: General;  Laterality: N/A;   COLONOSCOPY     ESOPHAGOGASTRODUODENOSCOPY  08/13/2011   Procedure: ESOPHAGOGASTRODUODENOSCOPY (EGD);  Surgeon: Gatha Mayer, MD;  Location: [facility] ENDOSCOPY;  Service: Endoscopy;  Laterality: N/A;   implantable loop recorder placement  07/20/2020   Medtronic Reveal Linq model LNQ 22 ([place] [id number]) implantable loop recorder by Dr Rayann Heman    Allergies  Allergies  Allergen Reactions  Actos [Pioglitazone] Swelling    Swelling all over body and moonface   Lisinopril Swelling    Swelling, may have had some breathing involvement. Angioedema   Nsaids Other (See Comments)    Patient reports internal bleeding. Experienced with tolmetin.    Penicillins Shortness Of Breath and Swelling    Arm Swelling with Penicillin (Occurred in 1960s) Breathing - throat swelling with Amoxicillin (Occurred prior to 2002)   Sulfamethoxazole Hives and Itching    "welps all over" immediately after dose   Flecainide Other (See Comments)    Blurry  vision   Hctz [Hydrochlorothiazide] Other (See Comments)    Gout   Iodinated Contrast Media Itching    Pt. Developed mild itching after  receiving IV cm; pt. Held; Dr. Keane Scrape recomended she take 50 mg of benadryl when she goes home-if necessary; Dr Mickey Farber recommends benadryl prior to future exams requiring contrast media, but stated other doctors may recommend another premedication prep.   Statins Other (See Comments)    Muscle aches with multiple statins. Able to tolerate rosuvastatin.   Gabapentin Other (See Comments)    Caused dysphoria    Losartan Potassium Swelling    Lower extremity swelling   Zetia [Ezetimibe] Other (See Comments)    Cramps      EKGs/Labs/Other Studies Reviewed:   The following studies were reviewed today:  Lexiscan Myoview 05/10/2020  Nuclear stress EF: 66%. The left ventricular ejection fraction is hyperdynamic (>65%). There was no ST segment deviation noted during stress. The study is normal. This is a low risk study.   No ischemia or infarction on perfusion images. Mild perfusion defect at segment 17 apical cap - likely attenuation artifact. Normal wall motion.   Echocardiogram 12/18/19  IMPRESSIONS     1. Left ventricular ejection fraction, by estimation, is 65 to 70%. The  left ventricle has normal function. The left ventricle has no regional  wall motion abnormalities. There is mild left ventricular hypertrophy.  Left ventricular diastolic parameters  are indeterminate. Elevated left atrial pressure.   2. Right ventricular systolic function is normal. The right ventricular  size is normal.   3. A small pericardial effusion is present. There is no evidence of  cardiac tamponade.   4. The mitral valve is degenerative. Trivial mitral valve regurgitation.  No evidence of mitral stenosis.   5. The aortic valve has an indeterminant number of cusps. Aortic valve  regurgitation is not visualized. No aortic stenosis is present.   6. The inferior vena cava is normal in size with greater than 50%  respiratory variability, suggesting right atrial pressure of 3 mmHg.   EKG:  EKG  is  ordered today.  The ekg ordered today demonstrates NSR, rate 64 bpm  Recent Labs: 03/21/2021: ALT 10 08/30/2021: BUN 27; Creatinine, Ser 1.34; Hemoglobin 11.5; Platelets 223; Potassium 4.9; Sodium 136  Recent Lipid Panel    Component Value Date/Time   CHOL 144 07/14/2020 1121   TRIG 77 07/14/2020 1121   HDL 60 07/14/2020 1121   CHOLHDL 2.4 07/14/2020 1121   CHOLHDL 4.7 01/27/2015 1238   VLDL 28 01/27/2015 1238   LDLCALC 69 07/14/2020 1121   LDLDIRECT 62 11/24/2018 1211   LDLDIRECT 178 (H) 05/12/2015 1229    Risk Assessment/Calculations:   CHA2DS2-VASc Score = 9   This indicates a 12.2% annual risk of stroke. The patient's score is based upon: CHF History: 1 HTN History: 1 Diabetes History: 1 Stroke History: 2 Vascular Disease History: 1 Age Score: 2 Gender  Score: 1   {This patient has a significant risk of stroke if diagnosed with atrial fibrillation.  Please consider VKA or DOAC agent for anticoagulation if the bleeding risk is acceptable.   You can also use the SmartPhrase .Marked Tree for documentation.   :[id number]}  Home Medications   No outpatient medications have been marked as taking for the 10/16/21 encounter (Appointment) with Josue Hector, MD.     Review of Systems      All other systems reviewed and are otherwise negative except as noted above.  Physical Exam    VS:  There were no vitals taken for this visit. , BMI There is no height or weight on file to calculate BMI.  Wt Readings from Last 3 Encounters:  09/18/21 167 lb 9.6 oz (76 kg)  09/11/21 171 lb 12.8 oz (77.9 kg)  08/31/21 171 lb 3.2 oz (77.7 kg)    Affect appropriate Healthy:  appears stated age HEENT: normal Neck supple with no adenopathy JVP normal no bruits no thyromegaly Lungs clear with no wheezing and good diaphragmatic motion Heart:  S1/S2 no murmur, no rub, gallop or click PMI normal Abdomen: benighn, BS positve, no tenderness, no AAA no bruit.  No HSM or HJR Distal  pulses intact with no bruits No edema Neuro non-focal Skin warm and dry No muscular weakness   Assessment & Plan    GB: Post lap chole Dr Ninfa Linden 08/31/21 held eliquis with no issues   Paroxysmal Afib/Chronic anticoagulation -some symptoms with rapid heart beat and anxiety back in November/early December -No symptoms recently -No bleeding on Eliquis [place] VASC 5  -Seen by [place] and Linq adjusted to catch PAF rates > 150 bpm - Last episode 01/2021  - PACEART 09/09/21 negative   Hypertension -Well controlled.  Continue current medications and low sodium Dash type diet.    Diabetes Mellitus -On Ozempic and now she is losing weight -Continue to monitor your blood glucose regularly -Follow-up with PCP - Recently started Jardiance   Hyperlipidemia -continue statin labs with primary    Disposition: Follow up  in  6 months   Signed, [name], MD 10/11/2021, 1:27 PM [place]

## 2021-10-13 DIAGNOSIS — H1013 Acute atopic conjunctivitis, bilateral: Secondary | ICD-10-CM | POA: Insufficient documentation

## 2021-10-13 HISTORY — DX: Acute atopic conjunctivitis, bilateral: H10.13

## 2021-10-16 ENCOUNTER — Ambulatory Visit: Payer: Medicare Other | Admitting: Cardiovascular Disease

## 2021-10-16 ENCOUNTER — Ambulatory Visit (INDEPENDENT_AMBULATORY_CARE_PROVIDER_SITE_OTHER): Payer: Medicare Other

## 2021-10-16 DIAGNOSIS — I480 Paroxysmal atrial fibrillation: Secondary | ICD-10-CM

## 2021-10-17 LAB — CUP PACEART REMOTE DEVICE CHECK
Date Time Interrogation Session: 20230817230305
Implantable Pulse Generator Implant Date: 20220525

## 2021-10-19 ENCOUNTER — Ambulatory Visit: Payer: Medicare Other | Admitting: Pharmacist

## 2021-10-19 ENCOUNTER — Ambulatory Visit: Payer: Medicare Other | Admitting: Family Medicine

## 2021-10-23 ENCOUNTER — Encounter: Payer: Self-pay | Admitting: Family Medicine

## 2021-10-23 ENCOUNTER — Ambulatory Visit: Payer: Medicare Other | Admitting: Pharmacist

## 2021-10-23 ENCOUNTER — Ambulatory Visit: Payer: Medicare Other | Admitting: Family Medicine

## 2021-10-23 ENCOUNTER — Encounter: Payer: Self-pay | Admitting: Pharmacist

## 2021-10-23 VITALS — BP 153/80 | HR 71 | Ht 64.0 in | Wt 165.2 lb

## 2021-10-23 DIAGNOSIS — E1140 Type 2 diabetes mellitus with diabetic neuropathy, unspecified: Secondary | ICD-10-CM

## 2021-10-23 DIAGNOSIS — E039 Hypothyroidism, unspecified: Secondary | ICD-10-CM | POA: Diagnosis not present

## 2021-10-23 DIAGNOSIS — E1122 Type 2 diabetes mellitus with diabetic chronic kidney disease: Secondary | ICD-10-CM

## 2021-10-23 DIAGNOSIS — I10 Essential (primary) hypertension: Secondary | ICD-10-CM | POA: Diagnosis not present

## 2021-10-23 DIAGNOSIS — D126 Benign neoplasm of colon, unspecified: Secondary | ICD-10-CM

## 2021-10-23 DIAGNOSIS — I480 Paroxysmal atrial fibrillation: Secondary | ICD-10-CM | POA: Diagnosis not present

## 2021-10-23 DIAGNOSIS — K31819 Angiodysplasia of stomach and duodenum without bleeding: Secondary | ICD-10-CM

## 2021-10-23 HISTORY — DX: Benign neoplasm of colon, unspecified: D12.6

## 2021-10-23 MED ORDER — CARVEDILOL 25 MG PO TABS: 25.0000 mg | ORAL_TABLET | Freq: Two times a day (BID) | ORAL | 3 refills | Status: DC

## 2021-10-23 MED ORDER — SEMAGLUTIDE (1 MG/DOSE) 2 MG/1.5ML ~~LOC~~ SOPN: 1.0000 mg | PEN_INJECTOR | SUBCUTANEOUS | Status: DC

## 2021-10-23 NOTE — Assessment & Plan Note (Signed)
Continue.apixiban.  Make sure GI knows on blood thinner which will be OK to hold for colonoscopy.

## 2021-10-23 NOTE — Assessment & Plan Note (Signed)
Continue PPI indefinitely.

## 2021-10-23 NOTE — Progress Notes (Unsigned)
S:     Chief Complaint  Patient presents with   Medication Management    T2DM, HTN   Courtney Pena is a 81 y.o. female who presents for diabetes evaluation, education, and management.  PMH is significant for T2DM, HTN, gout.  Patient was referred and last seen by Primary Care Provider, Dr. Andria Frames, on 09/11/21.   Today, patient arrives in good spirits and presents without any assistance. She reports high blood sugars for the past week after getting a steroid injection in her shoulder. She feels high energy and has not been experiencing typical symptoms of hyperglycemia. She reports prior to this her BG was controlled. She has a recent prescription for allopurinol from rheumatology and wants to know if it is ok to start taking with her other medications.   Patient reports Diabetes was diagnosed in 2008.   Current diabetes medications include: Metformin '500mg'$  daily, semaglutide 0.'5mg'$  weekly (Tuesday) Current hypertension medications include: Metoprolol succinate '100mg'$  BID, diltiazem '360mg'$  daily, diltiazem '30mg'$  PRN tachycardia Current hyperlipidemia medications include: Rosuvastatin '10mg'$  daily  Patient reports adherence to taking all medications as prescribed. Very seldomly forgets to take.   Do you feel that your medications are working for you? yes Have you been experiencing any side effects to the medications prescribed? no Do you have any problems obtaining medications due to transportation or finances? no Insurance coverage: [facility] Medicare  Patient denies hypoglycemic events.  Reported home fasting blood sugars: In the 200s since getting a steroid shot in her shoulder, previously in the 100s  Patient denies nocturia (nighttime urination).  Patient reports neuropathy (nerve pain). Patient denies visual changes.  O:   Review of Systems  Gastrointestinal:  Negative for heartburn.  Neurological:  Positive for tingling.    Physical Exam Constitutional:       Appearance: Normal appearance.  Neurological:     Mental Status: She is alert.  Psychiatric:        Mood and Affect: Mood normal.        Behavior: Behavior normal.    Lab Results  Component Value Date   HGBA1C 6.7 (H) 06/28/2021   Vitals:   10/23/21 0912 10/23/21 0913  BP: (!) 147/80 (!) 153/80  Pulse: 71   SpO2: 99%     Lipid Panel     Component Value Date/Time   CHOL 144 07/14/2020 1121   TRIG 77 07/14/2020 1121   HDL 60 07/14/2020 1121   CHOLHDL 2.4 07/14/2020 1121   CHOLHDL 4.7 01/27/2015 1238   VLDL 28 01/27/2015 1238   LDLCALC 69 07/14/2020 1121   LDLDIRECT 62 11/24/2018 1211   LDLDIRECT 178 (H) 05/12/2015 1229    Clinical Atherosclerotic Cardiovascular Disease (ASCVD): No  The ASCVD Risk score (Arnett DK, et al., 2019) failed to calculate for the following reasons:   The 2019 ASCVD risk score is only valid for ages 18 to 75   A/P: Diabetes longstanding currently controlled. Medication adherence appears good. Recent blood glucose readings are high due to steroid injection. - No changes to current medication regimen - Next A1c anticipated in October.   Hypertension longstanding currently uncontrolled. Blood pressure goal of <130/80 mmHg. Medication adherence good.  -Switch from metoprolol succinate '100mg'$  BID to carvedilol '25mg'$  BID. -Patient educated on purpose, proper use and potential adverse effects of carvedilol (hypotension, bradycardia). Following instruction patient verbalized understanding of treatment plan.    Patient was instructed to start taking allopurinol as prescribed by Dr. Posey Pronto.   -Patient educated on purpose,  proper use and potential adverse effects. Following instruction patient verbalized understanding of treatment plan.   Written patient instructions provided. Patient verbalized understanding of treatment plan.  Total time in face to face counseling 32 minutes.    Follow-up:  Pharmacist PRN. PCP clinic visit in October for A1c.  Patient  seen with Martina Sinner, PharmD Candidate and Titus Dubin, PharmD PGY-1 Resident.

## 2021-10-23 NOTE — Patient Instructions (Addendum)
Make the one change by Dr. Valentina Lucks.  Stop the metoprolol and start the carvedilol.   We will repeat your A1C in Oct and give you a flu shot. I put in a referral to the GI doc.  Someone should call.  Tell them you are on a blood thinner. I will call lab test results.

## 2021-10-23 NOTE — Progress Notes (Signed)
    SUBJECTIVE:   CHIEF COMPLAINT / HPI:   FU several problems.  Overall, "I feel better than I have in years." HBP.  BP up today.  Has already seen Dr. Valentina Lucks.  I agree with his recommendation to switch metoprolol to carvedilol.   Due for repeat colonoscopy for adenomatous polyp follow up.  She asked some clarifying questions about this, which I answered.   Last A1C at goal.  I could repeat today, but we decided to put off until oct.  She has received a steroikd injection that will affect the results. On chronic apixban with previous GI bleed.  Continue PPI indefinitely.      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  Adenomatous polyp of colon Refer back to GI for FU colonoscopy.  Hypothyroidism Check TSH  HYPERTENSION, BENIGN SYSTEMIC Switch metoprolol to carveidlol.  BMP today.    Paroxysmal atrial fibrillation (HCC) Continue.apixiban.  Make sure GI knows on blood thinner which will be OK to hold for colonoscopy.     [name], MD [place]

## 2021-10-23 NOTE — Assessment & Plan Note (Signed)
Check TSH 

## 2021-10-23 NOTE — Assessment & Plan Note (Signed)
Switch metoprolol to carveidlol.  BMP today.

## 2021-10-23 NOTE — Patient Instructions (Signed)
Exchange carvedilol '25mg'$  in place of metoprolol '100mg'$  TWICE daily.   Great to see you today!

## 2021-10-23 NOTE — Assessment & Plan Note (Signed)
Refer back to GI for FU colonoscopy.

## 2021-10-24 ENCOUNTER — Telehealth: Payer: Self-pay | Admitting: Family Medicine

## 2021-10-24 LAB — BASIC METABOLIC PANEL
BUN/Creatinine Ratio: 22 (ref 12–28)
BUN: 25 mg/dL (ref 8–27)
CO2: 20 mmol/L (ref 20–29)
Calcium: 10.2 mg/dL (ref 8.7–10.3)
Chloride: 103 mmol/L (ref 96–106)
Creatinine, Ser: 1.14 mg/dL — ABNORMAL HIGH (ref 0.57–1.00)
Glucose: 143 mg/dL — ABNORMAL HIGH (ref 70–99)
Potassium: 4.5 mmol/L (ref 3.5–5.2)
Sodium: 141 mmol/L (ref 134–144)
eGFR: 48 mL/min/{1.73_m2} — ABNORMAL LOW (ref >59)

## 2021-10-24 LAB — TSH: TSH: 0.779 u[IU]/mL (ref 0.450–4.500)

## 2021-10-24 NOTE — Progress Notes (Signed)
Noted and agree. 

## 2021-10-24 NOTE — Assessment & Plan Note (Signed)
Diabetes longstanding currently controlled. Recent blood glucose readings are elevated to 200-low 300s due to steroid injection. Minimally symptomatic with elevated readings. Medication adherence appears good.  - No changes to current medication regimen, discussed control will improve as steroid-induced hyperglycemia resolves. - Next A1c anticipated in October.

## 2021-10-24 NOTE — Assessment & Plan Note (Signed)
>>  ASSESSMENT AND PLAN FOR TYPE 2 DIABETES MELLITUS WITH DIABETIC NEUROPATHY, UNSPECIFIED (HCC) WRITTEN ON 10/24/2021  7:45 AM BY KOVAL, PETER G, RPH-CPP  Diabetes longstanding currently controlled. Recent blood glucose readings are elevated to 200-low 300s due to steroid injection. Minimally symptomatic with elevated readings. Medication adherence appears good.  - No changes to current medication regimen, discussed control will improve as steroid-induced hyperglycemia resolves. - Next A1c anticipated in October.

## 2021-10-24 NOTE — Assessment & Plan Note (Signed)
Hypertension longstanding currently uncontrolled. Blood pressure goal systolic of <932 mmHg. Medication adherence good.  -Switch from metoprolol succinate '100mg'$  BID to carvedilol '25mg'$  BID. -Patient educated on purpose, proper use and potential adverse effects of carvedilol (hypotension, bradycardia). Following instruction patient verbalized understanding of treatment plan.

## 2021-10-24 NOTE — Telephone Encounter (Signed)
Called and informed lab results are reassuring.  No change in treatment.

## 2021-11-13 NOTE — Progress Notes (Signed)
[facility] Summary Report / Loop Recorder 

## 2021-11-15 ENCOUNTER — Encounter: Payer: Self-pay | Admitting: *Deleted

## 2021-11-20 ENCOUNTER — Ambulatory Visit (INDEPENDENT_AMBULATORY_CARE_PROVIDER_SITE_OTHER): Payer: Medicare Other

## 2021-11-20 DIAGNOSIS — I480 Paroxysmal atrial fibrillation: Secondary | ICD-10-CM

## 2021-11-21 LAB — CUP PACEART REMOTE DEVICE CHECK
Date Time Interrogation Session: 20230919230346
Implantable Pulse Generator Implant Date: 20220525

## 2021-12-07 NOTE — Progress Notes (Signed)
[facility] Summary Report / Loop Recorder 

## 2021-12-18 ENCOUNTER — Encounter: Payer: Self-pay | Admitting: Family Medicine

## 2021-12-18 ENCOUNTER — Ambulatory Visit: Payer: Medicare Other | Admitting: Family Medicine

## 2021-12-18 VITALS — BP 144/72 | HR 71 | Wt 162.6 lb

## 2021-12-18 DIAGNOSIS — R6889 Other general symptoms and signs: Secondary | ICD-10-CM | POA: Insufficient documentation

## 2021-12-18 DIAGNOSIS — E1140 Type 2 diabetes mellitus with diabetic neuropathy, unspecified: Secondary | ICD-10-CM

## 2021-12-18 DIAGNOSIS — Z23 Encounter for immunization: Secondary | ICD-10-CM

## 2021-12-18 DIAGNOSIS — I10 Essential (primary) hypertension: Secondary | ICD-10-CM | POA: Diagnosis not present

## 2021-12-18 DIAGNOSIS — E1122 Type 2 diabetes mellitus with diabetic chronic kidney disease: Secondary | ICD-10-CM | POA: Diagnosis not present

## 2021-12-18 HISTORY — DX: Other general symptoms and signs: R68.89

## 2021-12-18 LAB — POCT GLYCOSYLATED HEMOGLOBIN (HGB A1C): HbA1c, POC (controlled diabetic range): 6.7 % (ref 0.0–7.0)

## 2021-12-18 MED ORDER — CARVEDILOL 25 MG PO TABS: 25.0000 mg | ORAL_TABLET | Freq: Two times a day (BID) | ORAL | 3 refills | Status: DC

## 2021-12-18 NOTE — Assessment & Plan Note (Signed)
Suboptimal control.  She agreed to try carvedilol again. She does not think her reaction was any kind of allergy.

## 2021-12-18 NOTE — Assessment & Plan Note (Signed)
>>  ASSESSMENT AND PLAN FOR TYPE 2 DIABETES MELLITUS WITH DIABETIC NEUROPATHY, UNSPECIFIED (HCC) WRITTEN ON 12/18/2021  3:03 PM BY HENSEL, Santiago Bumpers, MD  At goal on current meds.

## 2021-12-18 NOTE — Assessment & Plan Note (Signed)
No further WU at this time.  Nl TSH.  Does not sound like [place].  Perhaps the added alpha blockade of carvedilol might help.

## 2021-12-18 NOTE — Assessment & Plan Note (Signed)
At goal on current meds.

## 2021-12-18 NOTE — Progress Notes (Signed)
    SUBJECTIVE:   CHIEF COMPLAINT / HPI:   FU multiple issues: DM.  A1C today nicely at goal (6.7)  Tolerating meds well.  No concerns. I am cold all the time.  She was the warm person in the room a decade ago when she was heavier.  Now, gets cold easily.  Symptom present for over a year.  Not progressive.  Recent TSH normal.  Handds do not turn blue Hypertension: BP modestly elevated today.  Did not switch from metoprolol to carvedilol.  Only took carvedilol for two days and "had a reaction."  Cannot recall what that reaction was.  I had made this switch knowing carvedilol is a better BP agent than metoprolol.  I wanted to avoid another med.   Afib.  Stable.  No concerns.   OBJECTIVE:   BP (!) 144/72   Pulse 71   Wt 162 lb 9.6 oz (73.8 kg)   SpO2 98%   BMI 27.91 kg/m   Lungs clear Cardiac RRR without m or g Ext trace edema  ASSESSMENT/PLAN:   Cold intolerance of hand No further WU at this time.  Nl TSH.  Does not sound like [place].  Perhaps the added alpha blockade of carvedilol might help.    Type 2 diabetes mellitus with diabetic neuropathy, unspecified ([place]) At goal on current meds.  HYPERTENSION, BENIGN SYSTEMIC Suboptimal control.  She agreed to try carvedilol again. She does not think her reaction was any kind of allergy.     [name], MD [place]

## 2021-12-18 NOTE — Patient Instructions (Signed)
You look great. Try the carvedilol again. It is better than metoprolol for your blood pressure.  Regardless, I want to see you again to make sure the blood pressure is under control.  We are aiming for less than 140/90 I retire in February.  It has been a pleasure taking care of you and your family.

## 2021-12-22 LAB — CUP PACEART REMOTE DEVICE CHECK
Date Time Interrogation Session: 20231022230644
Implantable Pulse Generator Implant Date: 20220525

## 2021-12-25 ENCOUNTER — Ambulatory Visit (INDEPENDENT_AMBULATORY_CARE_PROVIDER_SITE_OTHER): Payer: Medicare Other

## 2021-12-25 DIAGNOSIS — I480 Paroxysmal atrial fibrillation: Secondary | ICD-10-CM | POA: Diagnosis not present

## 2022-01-02 NOTE — Progress Notes (Unsigned)
Office Visit    Patient Name: Courtney Pena Date of Encounter: 01/03/2022  PCP:  [name], MD   [place]  Cardiologist:  [name], MD  Electrophysiologist: Allred/Camnitz  Chief Complaint    Courtney Pena is a 81 y.o. female with a hx of DM, hypertension, hyperlipidemia, obesity, aflutter (off xarelto 2013 secondary to UGIB now on Eliquis), CHADS2VASC of 5 Last seen in May to clear for Laparoscopic cholecystectomy which she had with Dr Thermon Leyland uneventfully on 07/05/21   She has ILR in place with low PAF burden. She has been intolerant to flecainide She had is on Eliquis and cardizem She had some bleeding issues with xarelto in past. She continues to have some PAF once during coloscopy and once with steroid injection They can last 3-4 hours then she converts No CAD with normal myovue EF 66% 05/10/20 TTE 12/18/19 EF 65-70% mild LAE 4.2 cm no valve dx.    Has had left sided headaches Had left temporal artery biopsy with Dr Ninfa Linden on 08/31/21 Rx with steroids but biopsy negative Her BS;s elevated on steroids. Neuropathy not helped by Cymbalta and intolerant to gabapentin   Seen by EP Dr Curt Bears 09/18/21 Linq monitor showed last episode of flutter/fib was December 2022 [place] 0% although she thinks she feels some episodes Linq changed to pick up episodes 150 bpm or higher   She is having some exertional chest tightness No rest pain Seems to correlate some with the fluttering in her chest Her BS and BP much better since she lost weight    Past Medical History    Past Medical History:  Diagnosis Date   Allergy    Arthritis    Atrial flutter ([place]) 12/07/2010   converted in ED with 300 mg flecainide   CAD (coronary artery disease)    a. mild per cath in 2004;  b. nonischemic Myoview in March 2012;  c. Lex MV 1/14:  EF 66%, no ischemia   Cataract    Dysrhythmia    A-Fib. 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       Past Surgical History:  Procedure Laterality Date   ARTERY BIOPSY N/A 08/31/2021   Procedure: LEFT TEMPORAL ARTERY BIOPSY;  Surgeon: Coralie Keens, MD;  Location: [facility] ORS;  Service: General;  Laterality: N/A;   BREAST BIOPSY Left    BREAST EXCISIONAL BIOPSY Left    benign   CARDIAC CATHETERIZATION  1999&2004   CARPAL TUNNEL RELEASE Bilateral 2003   CATARACT EXTRACTION Bilateral 1990   CHOLECYSTECTOMY N/A 07/05/2021   Procedure: LAPAROSCOPIC CHOLECYSTECTOMY;  Surgeon: Felicie Morn, MD;  Location: [facility]S;  Service: General;  Laterality: N/A;   COLONOSCOPY     ESOPHAGOGASTRODUODENOSCOPY  08/13/2011   Procedure: ESOPHAGOGASTRODUODENOSCOPY (EGD);  Surgeon: Gatha Mayer, MD;  Location: [facility] ENDOSCOPY;  Service: Endoscopy;  Laterality: N/A;   implantable loop recorder placement  07/20/2020   Medtronic Reveal Linq model LNQ 22 ([place] [id number]) implantable loop recorder by Dr Rayann Heman    Allergies  Allergies  Allergen Reactions   Actos [Pioglitazone] Swelling    Swelling all over body and moonface   Lisinopril Swelling    Swelling, may have had some breathing involvement. Angioedema   Nsaids Other (See Comments)    Patient reports internal bleeding. Experienced with tolmetin.    Penicillins Shortness Of Breath and Swelling    Arm Swelling with Penicillin (Occurred in 1960s) Breathing - throat  swelling with Amoxicillin (Occurred prior to 2002)   Sulfamethoxazole Hives and Itching    "welps all over" immediately after dose   Flecainide Other (See Comments)    Blurry  vision   Hctz [Hydrochlorothiazide] Other (See Comments)    Gout   Iodinated Contrast Media Itching    Pt. Developed mild itching after receiving IV cm; pt. Held; Dr. Keane Scrape recomended she take 50 mg of benadryl when she goes home-if necessary; Dr Mickey Farber recommends benadryl prior to future exams requiring contrast media, but stated other doctors may recommend another premedication prep.   Prednisone Other (See Comments)    Agitation, vivid dreams, anxiety does not want to take again.    Statins Other (See Comments)    Muscle aches with multiple statins. Able to tolerate rosuvastatin.   Gabapentin Other (See Comments)    Caused dysphoria    Losartan Potassium Swelling    Lower extremity swelling   Zetia [Ezetimibe] Other (See Comments)    Cramps      EKGs/Labs/Other Studies Reviewed:   The following studies were reviewed today:  Lexiscan Myoview 05/10/2020  Nuclear stress EF: 66%. The left ventricular ejection fraction is hyperdynamic (>65%). There was no ST segment deviation noted during stress. The study is normal. This is a low risk study.   No ischemia or infarction on perfusion images. Mild perfusion defect at segment 17 apical cap - likely attenuation artifact. Normal wall motion.   Echocardiogram 12/18/19  IMPRESSIONS     1. Left ventricular ejection fraction, by estimation, is 65 to 70%. The  left ventricle has normal function. The left ventricle has no regional  wall motion abnormalities. There is mild left ventricular hypertrophy.  Left ventricular diastolic parameters  are indeterminate. Elevated left atrial pressure.   2. Right ventricular systolic function is normal. The right ventricular  size is normal.   3. A small pericardial effusion is present. There is no evidence of   cardiac tamponade.   4. The mitral valve is degenerative. Trivial mitral valve regurgitation.  No evidence of mitral stenosis.   5. The aortic valve has an indeterminant number of cusps. Aortic valve  regurgitation is not visualized. No aortic stenosis is present.   6. The inferior vena cava is normal in size with greater than 50%  respiratory variability, suggesting right atrial pressure of 3 mmHg.   EKG:  EKG is  ordered today.  The ekg ordered today demonstrates NSR, rate 64 bpm  Recent Labs: 03/21/2021: ALT 10 08/30/2021: Hemoglobin 11.5; Platelets 223 10/23/2021: BUN 25; Creatinine, Ser 1.14; Potassium 4.5; Sodium 141; TSH 0.779  Recent Lipid Panel    Component Value Date/Time   CHOL 144 07/14/2020 1121   TRIG 77 07/14/2020 1121   HDL 60 07/14/2020 1121   CHOLHDL 2.4 07/14/2020 1121   CHOLHDL 4.7 01/27/2015  1238   VLDL 28 01/27/2015 1238   LDLCALC 69 07/14/2020 1121   LDLDIRECT 62 11/24/2018 1211   LDLDIRECT 178 (H) 05/12/2015 1229    Risk Assessment/Calculations:   CHA2DS2-VASc Score = 9   This indicates a 12.2% annual risk of stroke. The patient's score is based upon: CHF History: 1 HTN History: 1 Diabetes History: 1 Stroke History: 2 Vascular Disease History: 1 Age Score: 2 Gender Score: 1   {This patient has a significant risk of stroke if diagnosed with atrial fibrillation.  Please consider VKA or DOAC agent for anticoagulation if the bleeding risk is acceptable.   You can also use the SmartPhrase .[place] for documentation.   :[id number]}  Home Medications   Current Meds  Medication Sig   acetaminophen (TYLENOL) 650 MG CR tablet Take 650 mg by mouth every 8 (eight) hours as needed for pain.   allopurinol (ZYLOPRIM) 100 MG tablet Take 1 tablet by mouth daily.   apixaban (ELIQUIS) 5 MG TABS tablet Take 1 tablet (5 mg total) by mouth 2 (two) times daily.   Apoaequorin (PREVAGEN PO) Take 1 tablet by mouth daily.   blood glucose meter kit and supplies  Dispense based on patient and insurance preference. Use up to four times daily as directed. (FOR ICD-10 E10.9, E11.9).   carvedilol (COREG) 25 MG tablet Take 1 tablet (25 mg total) by mouth 2 (two) times daily with a meal.   cetirizine (ZYRTEC) 10 MG tablet Take 10 mg by mouth daily as needed for allergies.   Cholecalciferol (VITAMIN D3) 2000 units TABS Take 4,000 Units by mouth daily.   Coenzyme Q10 (COQ10) 100 MG CAPS Take 100 mg by mouth daily.   colchicine 0.6 MG tablet Take 1 tablet (0.6 mg total) by mouth as needed.   diclofenac Sodium (VOLTAREN) 1 % GEL Apply 2 g topically as needed (pain).   diltiazem (CARDIZEM CD) 360 MG 24 hr capsule TAKE 1 CAPSULE BY MOUTH  DAILY   diltiazem (CARDIZEM) 30 MG tablet Take 1 tablet (30 mg total) by mouth every 4 (four) hours as needed (elevated HR as long as BP > 100).   ferrous sulfate 325 (65 FE) MG EC tablet Take 650 mg by mouth daily.    glucose blood (ONETOUCH ULTRA) test strip 1 Container by Other route every morning.   Insulin Pen Needle 31G X 8 MM MISC Use with pen to inject once daily.   Lancets (ONETOUCH ULTRASOFT) lancets Use to check blood sugar daily.   levothyroxine (SYNTHROID) 112 MCG tablet TAKE 1 TABLET BY MOUTH DAILY  BEFORE BREAKFAST   meclizine (ANTIVERT) 25 MG tablet TAKE 1 TABLET (25 MG TOTAL) BY MOUTH AS NEEDED.   metFORMIN (GLUCOPHAGE-XR) 500 MG 24 hr tablet TAKE 4 TABLETS BY MOUTH  DAILY   Multiple Vitamin (MULTI-VITAMINS) TABS Take 1 tablet by mouth daily.   nitroGLYCERIN (NITROSTAT) 0.4 MG SL tablet Place 1 tablet (0.4 mg total) under the tongue every 5 (five) minutes as needed for chest pain (Call 911 if chest pain after three doses).   olopatadine (PATANOL) 0.1 % ophthalmic solution Place 1 drop into both eyes 2 (two) times daily.   omeprazole (PRILOSEC) 40 MG capsule TAKE 1 CAPSULE BY MOUTH  DAILY   ONETOUCH ULTRA test strip USE TO TEST BLOOD SUGAR 3  TIMES DAILY   rosuvastatin (CRESTOR) 10 MG tablet TAKE 1 TABLET BY MOUTH   DAILY   Semaglutide, 1 MG/DOSE, 2 MG/1.5ML SOPN Inject 1 mg into the skin once a  week.     Review of Systems      All other systems reviewed and are otherwise negative except as noted above.  Physical Exam    VS:  BP 128/68 (BP Location: Right Leg, Patient Position: Sitting, Cuff Size: Normal)   Pulse 79   Ht _0  (1.651 m)   Wt 164 lb (74.4 kg)   SpO2 95%   BMI 27.29 kg/m  , BMI Body mass index is 27.29 kg/m.  Wt Readings from Last 3 Encounters:  01/03/22 164 lb (74.4 kg)  12/18/21 162 lb 9.6 oz (73.8 kg)  10/23/21 165 lb 3.2 oz (74.9 kg)    Affect appropriate Healthy:  appears stated age HEENT: normal Neck supple with no adenopathy JVP normal no bruits no thyromegaly Lungs clear with no wheezing and good diaphragmatic motion Heart:  S1/S2 no murmur, no rub, gallop or click PMI normal Abdomen: benighn, BS positve, no tenderness, no AAA no bruit.  No HSM or HJR Distal pulses intact with no bruits No edema Neuro non-focal Skin warm and dry No muscular weakness   Assessment & Plan    Headache:   - negative temporal artery biopsy f/u primary   Paroxysmal Afib/Chronic anticoagulation -continue Eliquis and cardizem - F/U Camnitz no burden since December 2022 - Linq monitoring parameters changed to detect 150 bpm or higher   Hypertension -Well controlled.  Continue current medications and low sodium Dash type diet.    Diabetes Mellitus -elevated when on steroids - continue insulin, glucophage and Semaglutide  -  A1c 6.7   Hyperlipidemia -PCP appointment this week and would recommend a lipid panel and LFTs - LDL 69 last checked 07/14/20 labs with primary  - continue crestor   6. Thyroid:   -on synthroid replacement TSH 0.779 10/23/21   7. Chest Pain:   - shared decision making PET/CT r/o CAD   PET/CT   Disposition: Follow up  in  a year F/U Camnitz 6 months   Signed, [name], MD 01/03/2022, 9:32 AM [place]

## 2022-01-03 ENCOUNTER — Ambulatory Visit: Payer: Medicare Other | Attending: Cardiovascular Disease | Admitting: Cardiovascular Disease

## 2022-01-03 ENCOUNTER — Encounter: Payer: Self-pay | Admitting: Cardiovascular Disease

## 2022-01-03 VITALS — BP 128/68 | HR 79 | Ht 65.0 in | Wt 164.0 lb

## 2022-01-03 DIAGNOSIS — I480 Paroxysmal atrial fibrillation: Secondary | ICD-10-CM | POA: Diagnosis not present

## 2022-01-03 DIAGNOSIS — I2510 Atherosclerotic heart disease of native coronary artery without angina pectoris: Secondary | ICD-10-CM

## 2022-01-03 DIAGNOSIS — Z7901 Long term (current) use of anticoagulants: Secondary | ICD-10-CM | POA: Diagnosis not present

## 2022-01-03 DIAGNOSIS — E782 Mixed hyperlipidemia: Secondary | ICD-10-CM

## 2022-01-03 DIAGNOSIS — I10 Essential (primary) hypertension: Secondary | ICD-10-CM | POA: Diagnosis not present

## 2022-01-03 DIAGNOSIS — R072 Precordial pain: Secondary | ICD-10-CM

## 2022-01-03 MED ORDER — NITROGLYCERIN 0.4 MG SL SUBL: 0.4000 mg | SUBLINGUAL_TABLET | SUBLINGUAL | 3 refills | Status: AC | PRN

## 2022-01-03 NOTE — Patient Instructions (Addendum)
Medication Instructions:  Your physician recommends that you continue on your current medications as directed. Please refer to the Current Medication list given to you today.  *If you need a refill on your cardiac medications before your next appointment, please call your pharmacy*  Lab Work: If you have labs (blood work) drawn today and your tests are completely normal, you will receive your results only by: [place] (if you have MyChart) OR A paper copy in the mail If you have any lab test that is abnormal or we need to change your treatment, we will call you to review the results.  Testing/Procedures: Your physician has requested that you have cardiac PET CT. Cardiac computed tomography (CT) is a painless test that uses an x-ray machine to take clear, detailed pictures of your heart. For further information please visit [email or web address]. Please follow instruction sheet as given.  Follow-Up: At [facility], you and your health needs are our priority.  As part of our continuing mission to provide you with exceptional heart care, we have created designated Provider Care Teams.  These Care Teams include your primary Cardiologist (physician) and Advanced Practice Providers (APPs -  Physician Assistants and Nurse Practitioners) who all work together to provide you with the care you need, when you need it.  We recommend signing up for the patient portal called "MyChart".  Sign up information is provided on this After Visit Summary.  MyChart is used to connect with patients for Virtual Visits (Telemedicine).  Patients are able to view lab/test results, encounter notes, upcoming appointments, etc.  Non-urgent messages can be sent to your provider as well.   To learn more about what you can do with MyChart, go to [email or web address].    Your next appointment:   6 months  The format for your next appointment:   In Person  Provider:   Dr. Curt Bears  How to Prepare for Your  Cardiac PET/CT Stress Test:  1. Please do not take these medications before your test:   Carvedilol-Medications that may interfere with the cardiac pharmacological stress agent (ex. nitrates - including erectile dysfunction medications or beta-blockers) the day of the exam. (Erectile dysfunction medication should be held for at least 72 hrs prior to test) Your remaining medications may be taken with water.  2. Nothing to eat or drink, except water, 3 hours prior to arrival time.   NO caffeine/decaffeinated products, or chocolate 12 hours prior to arrival.  3. NO perfume, cologne or lotion  4. Total time is 1 to 2 hours; you may want to bring reading material for the waiting time.  5. Please report to Admitting at the [facility] Main Entrance 60 minutes early for your test.  [place], [place] [address]  Diabetic Preparation:  Hold oral medications. Metoformin You may take NPH and Lantus insulin. Do not take Humalog or Humulin R (Regular Insulin) the day of your test. Check blood sugars prior to leaving the house. If able to eat breakfast prior to 3 hour fasting, you may take all medications, including your insulin, Do not worry if you miss your breakfast dose of insulin - start at your next meal.   In preparation for your appointment, medication and supplies will be purchased.  Appointment availability is limited, so if you need to cancel or reschedule, please call the Radiology Department at [phone]  24 hours in advance to avoid a cancellation fee of $100.00  What to Expect After you Arrive:  Once  you arrive and check in for your appointment, you will be taken to a preparation room within the Radiology Department.  A technologist or Nurse will obtain your medical history, verify that you are correctly prepped for the exam, and explain the procedure.  Afterwards,  an IV will be started in your arm and electrodes will be placed on your skin for EKG  monitoring during the stress portion of the exam. Then you will be escorted to the PET/CT scanner.  There, staff will get you positioned on the scanner and obtain a blood pressure and EKG.  During the exam, you will continue to be connected to the EKG and blood pressure machines.  A small, safe amount of a radioactive tracer will be injected in your IV to obtain a series of pictures of your heart along with an injection of a stress agent.    After your Exam:  It is recommended that you eat a meal and drink a caffeinated beverage to counter act any effects of the stress agent.  Drink plenty of fluids for the remainder of the day and urinate frequently for the first couple of hours after the exam.  Your doctor will inform you of your test results within 7-10 business days.  For questions about your test or how to prepare for your test, please call: Marchia Bond, Cardiac Imaging Nurse Navigator  Gordy Clement, Cardiac Imaging Nurse Navigator Office: [phone]  Important Information About Sugar

## 2022-01-04 ENCOUNTER — Emergency Department (HOSPITAL_COMMUNITY): Payer: Medicare Other

## 2022-01-04 ENCOUNTER — Encounter (HOSPITAL_COMMUNITY): Payer: Self-pay | Admitting: Emergency Medicine

## 2022-01-04 ENCOUNTER — Inpatient Hospital Stay (HOSPITAL_COMMUNITY)
Admission: EM | Admit: 2022-01-04 | Discharge: 2022-01-11 | DRG: 914 | Disposition: A | Discharge status: Home Health | Payer: Medicare Other | Attending: Internal Medicine | Admitting: Internal Medicine

## 2022-01-04 ENCOUNTER — Ambulatory Visit: Payer: Medicare Other | Admitting: Gastroenterology

## 2022-01-04 ENCOUNTER — Other Ambulatory Visit: Payer: Self-pay

## 2022-01-04 DIAGNOSIS — Z7901 Long term (current) use of anticoagulants: Secondary | ICD-10-CM

## 2022-01-04 DIAGNOSIS — K5649 Other impaction of intestine: Secondary | ICD-10-CM | POA: Diagnosis present

## 2022-01-04 DIAGNOSIS — J9811 Atelectasis: Secondary | ICD-10-CM | POA: Diagnosis present

## 2022-01-04 DIAGNOSIS — Z79899 Other long term (current) drug therapy: Secondary | ICD-10-CM | POA: Diagnosis not present

## 2022-01-04 DIAGNOSIS — R7989 Other specified abnormal findings of blood chemistry: Secondary | ICD-10-CM | POA: Diagnosis present

## 2022-01-04 DIAGNOSIS — R55 Syncope and collapse: Secondary | ICD-10-CM | POA: Diagnosis present

## 2022-01-04 DIAGNOSIS — R519 Headache, unspecified: Secondary | ICD-10-CM | POA: Diagnosis present

## 2022-01-04 DIAGNOSIS — Z7984 Long term (current) use of oral hypoglycemic drugs: Secondary | ICD-10-CM

## 2022-01-04 DIAGNOSIS — T148XXA Other injury of unspecified body region, initial encounter: Secondary | ICD-10-CM | POA: Diagnosis not present

## 2022-01-04 DIAGNOSIS — K219 Gastro-esophageal reflux disease without esophagitis: Secondary | ICD-10-CM | POA: Diagnosis present

## 2022-01-04 DIAGNOSIS — D6832 Hemorrhagic disorder due to extrinsic circulating anticoagulants: Secondary | ICD-10-CM | POA: Diagnosis present

## 2022-01-04 DIAGNOSIS — D62 Acute posthemorrhagic anemia: Secondary | ICD-10-CM | POA: Diagnosis not present

## 2022-01-04 DIAGNOSIS — T45515A Adverse effect of anticoagulants, initial encounter: Secondary | ICD-10-CM | POA: Diagnosis present

## 2022-01-04 DIAGNOSIS — Z794 Long term (current) use of insulin: Secondary | ICD-10-CM | POA: Diagnosis not present

## 2022-01-04 DIAGNOSIS — E869 Volume depletion, unspecified: Secondary | ICD-10-CM | POA: Diagnosis present

## 2022-01-04 DIAGNOSIS — N1831 Chronic kidney disease, stage 3a: Secondary | ICD-10-CM | POA: Diagnosis present

## 2022-01-04 DIAGNOSIS — I480 Paroxysmal atrial fibrillation: Secondary | ICD-10-CM | POA: Diagnosis present

## 2022-01-04 DIAGNOSIS — E1122 Type 2 diabetes mellitus with diabetic chronic kidney disease: Secondary | ICD-10-CM | POA: Diagnosis present

## 2022-01-04 DIAGNOSIS — Z833 Family history of diabetes mellitus: Secondary | ICD-10-CM

## 2022-01-04 DIAGNOSIS — Y9289 Other specified places as the place of occurrence of the external cause: Secondary | ICD-10-CM

## 2022-01-04 DIAGNOSIS — S9701XA Crushing injury of right ankle, initial encounter: Secondary | ICD-10-CM | POA: Diagnosis present

## 2022-01-04 DIAGNOSIS — I4892 Unspecified atrial flutter: Secondary | ICD-10-CM | POA: Diagnosis present

## 2022-01-04 DIAGNOSIS — S7002XA Contusion of left hip, initial encounter: Secondary | ICD-10-CM | POA: Diagnosis present

## 2022-01-04 DIAGNOSIS — E538 Deficiency of other specified B group vitamins: Secondary | ICD-10-CM | POA: Diagnosis present

## 2022-01-04 DIAGNOSIS — Z87891 Personal history of nicotine dependence: Secondary | ICD-10-CM | POA: Diagnosis not present

## 2022-01-04 DIAGNOSIS — M79604 Pain in right leg: Secondary | ICD-10-CM | POA: Diagnosis not present

## 2022-01-04 DIAGNOSIS — L97919 Non-pressure chronic ulcer of unspecified part of right lower leg with unspecified severity: Secondary | ICD-10-CM | POA: Diagnosis not present

## 2022-01-04 DIAGNOSIS — I3139 Other pericardial effusion (noninflammatory): Secondary | ICD-10-CM | POA: Diagnosis present

## 2022-01-04 DIAGNOSIS — S8011XA Contusion of right lower leg, initial encounter: Secondary | ICD-10-CM | POA: Diagnosis present

## 2022-01-04 DIAGNOSIS — Z91041 Radiographic dye allergy status: Secondary | ICD-10-CM

## 2022-01-04 DIAGNOSIS — Z9841 Cataract extraction status, right eye: Secondary | ICD-10-CM

## 2022-01-04 DIAGNOSIS — M069 Rheumatoid arthritis, unspecified: Secondary | ICD-10-CM | POA: Diagnosis present

## 2022-01-04 DIAGNOSIS — I129 Hypertensive chronic kidney disease with stage 1 through stage 4 chronic kidney disease, or unspecified chronic kidney disease: Secondary | ICD-10-CM | POA: Diagnosis present

## 2022-01-04 DIAGNOSIS — I2510 Atherosclerotic heart disease of native coronary artery without angina pectoris: Secondary | ICD-10-CM | POA: Diagnosis present

## 2022-01-04 DIAGNOSIS — E785 Hyperlipidemia, unspecified: Secondary | ICD-10-CM | POA: Diagnosis present

## 2022-01-04 DIAGNOSIS — E039 Hypothyroidism, unspecified: Secondary | ICD-10-CM | POA: Diagnosis present

## 2022-01-04 DIAGNOSIS — Z9842 Cataract extraction status, left eye: Secondary | ICD-10-CM

## 2022-01-04 DIAGNOSIS — M109 Gout, unspecified: Secondary | ICD-10-CM | POA: Diagnosis present

## 2022-01-04 DIAGNOSIS — Z886 Allergy status to analgesic agent status: Secondary | ICD-10-CM

## 2022-01-04 DIAGNOSIS — E1142 Type 2 diabetes mellitus with diabetic polyneuropathy: Secondary | ICD-10-CM | POA: Diagnosis present

## 2022-01-04 DIAGNOSIS — S300XXA Contusion of lower back and pelvis, initial encounter: Secondary | ICD-10-CM | POA: Diagnosis present

## 2022-01-04 DIAGNOSIS — Z8249 Family history of ischemic heart disease and other diseases of the circulatory system: Secondary | ICD-10-CM

## 2022-01-04 DIAGNOSIS — Z882 Allergy status to sulfonamides status: Secondary | ICD-10-CM

## 2022-01-04 DIAGNOSIS — Z888 Allergy status to other drugs, medicaments and biological substances status: Secondary | ICD-10-CM

## 2022-01-04 DIAGNOSIS — Z79890 Hormone replacement therapy (postmenopausal): Secondary | ICD-10-CM

## 2022-01-04 DIAGNOSIS — Z95818 Presence of other cardiac implants and grafts: Secondary | ICD-10-CM

## 2022-01-04 DIAGNOSIS — N183 Chronic kidney disease, stage 3 unspecified: Secondary | ICD-10-CM | POA: Diagnosis present

## 2022-01-04 LAB — LACTIC ACID, PLASMA: Lactic Acid, Venous: 0.9 mmol/L (ref 0.5–1.9)

## 2022-01-04 LAB — COMPREHENSIVE METABOLIC PANEL
ALT: 18 U/L (ref 0–44)
AST: 20 U/L (ref 15–41)
Albumin: 4.1 g/dL (ref 3.5–5.0)
Alkaline Phosphatase: 51 U/L (ref 38–126)
Anion gap: 7 (ref 5–15)
BUN: 23 mg/dL (ref 8–23)
CO2: 23 mmol/L (ref 22–32)
Calcium: 9.6 mg/dL (ref 8.9–10.3)
Chloride: 108 mmol/L (ref 98–111)
Creatinine, Ser: 1.2 mg/dL — ABNORMAL HIGH (ref 0.44–1.00)
GFR, Estimated: 45 mL/min — ABNORMAL LOW (ref >60)
Glucose, Bld: 125 mg/dL — ABNORMAL HIGH (ref 70–99)
Potassium: 4 mmol/L (ref 3.5–5.1)
Sodium: 138 mmol/L (ref 135–145)
Total Bilirubin: 0.6 mg/dL (ref 0.3–1.2)
Total Protein: 7.7 g/dL (ref 6.5–8.1)

## 2022-01-04 LAB — CBC
HCT: 33 % — ABNORMAL LOW (ref 36.0–46.0)
Hemoglobin: 10.2 g/dL — ABNORMAL LOW (ref 12.0–15.0)
MCH: 29.1 pg (ref 26.0–34.0)
MCHC: 30.9 g/dL (ref 30.0–36.0)
MCV: 94 fL (ref 80.0–100.0)
Platelets: 201 10*3/uL (ref 150–400)
RBC: 3.51 MIL/uL — ABNORMAL LOW (ref 3.87–5.11)
RDW: 15 % (ref 11.5–15.5)
WBC: 8.1 10*3/uL (ref 4.0–10.5)
nRBC: 0 % (ref 0.0–0.2)

## 2022-01-04 LAB — I-STAT CHEM 8, ED
BUN: 19 mg/dL (ref 8–23)
Calcium, Ion: 1.32 mmol/L (ref 1.15–1.40)
Chloride: 106 mmol/L (ref 98–111)
Creatinine, Ser: 1.3 mg/dL — ABNORMAL HIGH (ref 0.44–1.00)
Glucose, Bld: 120 mg/dL — ABNORMAL HIGH (ref 70–99)
HCT: 32 % — ABNORMAL LOW (ref 36.0–46.0)
Hemoglobin: 10.9 g/dL — ABNORMAL LOW (ref 12.0–15.0)
Potassium: 4 mmol/L (ref 3.5–5.1)
Sodium: 140 mmol/L (ref 135–145)
TCO2: 26 mmol/L (ref 22–32)

## 2022-01-04 LAB — CK: Total CK: 89 U/L (ref 38–234)

## 2022-01-04 LAB — ETHANOL: Alcohol, Ethyl (B): <10 mg/dL (ref <10)

## 2022-01-04 LAB — PROTIME-INR
INR: 1.3 — ABNORMAL HIGH (ref 0.8–1.2)
Prothrombin Time: 16.5 seconds — ABNORMAL HIGH (ref 11.4–15.2)

## 2022-01-04 LAB — SAMPLE TO BLOOD BANK

## 2022-01-04 LAB — CBG MONITORING, ED: Glucose-Capillary: 114 mg/dL — ABNORMAL HIGH (ref 70–99)

## 2022-01-04 MED ORDER — ALBUTEROL SULFATE (2.5 MG/3ML) 0.083% IN NEBU: 2.5000 mg | INHALATION_SOLUTION | RESPIRATORY_TRACT | Status: DC | PRN

## 2022-01-04 MED ORDER — FERROUS SULFATE 325 (65 FE) MG PO TABS
650.0000 mg | ORAL_TABLET | Freq: Every day | ORAL | Status: DC
  Administered 2022-01-05 – 2022-01-11 (×6): 650 mg via ORAL
  Filled 2022-01-04 (×8): qty 2

## 2022-01-04 MED ORDER — ROSUVASTATIN CALCIUM 10 MG PO TABS
10.0000 mg | ORAL_TABLET | Freq: Every day | ORAL | Status: DC
  Administered 2022-01-05 – 2022-01-11 (×7): 10 mg via ORAL
  Filled 2022-01-04 (×7): qty 1

## 2022-01-04 MED ORDER — ACETAMINOPHEN 500 MG PO TABS
1000.0000 mg | ORAL_TABLET | Freq: Once | ORAL | Status: AC
  Administered 2022-01-04: 1000 mg via ORAL
  Filled 2022-01-04: qty 2

## 2022-01-04 MED ORDER — SODIUM CHLORIDE 0.9 % IV BOLUS
500.0000 mL | Freq: Once | INTRAVENOUS | Status: AC
  Administered 2022-01-04: 500 mL via INTRAVENOUS

## 2022-01-04 MED ORDER — ALLOPURINOL 100 MG PO TABS
100.0000 mg | ORAL_TABLET | Freq: Every day | ORAL | Status: DC
  Administered 2022-01-05 – 2022-01-11 (×7): 100 mg via ORAL
  Filled 2022-01-04 (×7): qty 1

## 2022-01-04 MED ORDER — ONDANSETRON HCL 4 MG/2ML IJ SOLN: 4.0000 mg | Freq: Four times a day (QID) | INTRAMUSCULAR | Status: DC | PRN

## 2022-01-04 MED ORDER — DILTIAZEM HCL ER COATED BEADS 180 MG PO CP24
360.0000 mg | ORAL_CAPSULE | Freq: Every day | ORAL | Status: DC
  Administered 2022-01-05 – 2022-01-11 (×7): 360 mg via ORAL
  Filled 2022-01-04 (×5): qty 2
  Filled 2022-01-04: qty 3
  Filled 2022-01-04: qty 2

## 2022-01-04 MED ORDER — PANTOPRAZOLE SODIUM 40 MG PO TBEC
40.0000 mg | DELAYED_RELEASE_TABLET | Freq: Every day | ORAL | Status: DC
  Administered 2022-01-05 – 2022-01-11 (×7): 40 mg via ORAL
  Filled 2022-01-04 (×7): qty 1

## 2022-01-04 MED ORDER — NITROGLYCERIN 0.4 MG SL SUBL: 0.4000 mg | SUBLINGUAL_TABLET | SUBLINGUAL | Status: DC | PRN

## 2022-01-04 MED ORDER — CARVEDILOL 25 MG PO TABS
25.0000 mg | ORAL_TABLET | Freq: Two times a day (BID) | ORAL | Status: DC
  Administered 2022-01-05 – 2022-01-11 (×13): 25 mg via ORAL
  Filled 2022-01-04 (×2): qty 1
  Filled 2022-01-04: qty 2
  Filled 2022-01-04 (×2): qty 1
  Filled 2022-01-04: qty 2
  Filled 2022-01-04 (×7): qty 1

## 2022-01-04 MED ORDER — ONDANSETRON HCL 4 MG PO TABS: 4.0000 mg | ORAL_TABLET | Freq: Four times a day (QID) | ORAL | Status: DC | PRN

## 2022-01-04 MED ORDER — LEVOTHYROXINE SODIUM 112 MCG PO TABS
112.0000 ug | ORAL_TABLET | Freq: Every day | ORAL | Status: DC
  Administered 2022-01-05 – 2022-01-11 (×7): 112 ug via ORAL
  Filled 2022-01-04 (×7): qty 1

## 2022-01-04 MED ORDER — ACETAMINOPHEN 325 MG PO TABS
650.0000 mg | ORAL_TABLET | Freq: Three times a day (TID) | ORAL | Status: DC | PRN
  Administered 2022-01-04 – 2022-01-06 (×3): 650 mg via ORAL
  Filled 2022-01-04 (×3): qty 2

## 2022-01-04 MED ORDER — SODIUM CHLORIDE 0.9 % IV SOLN: INTRAVENOUS | Status: AC

## 2022-01-04 MED ORDER — INSULIN ASPART 100 UNIT/ML IJ SOLN
0.0000 [IU] | Freq: Three times a day (TID) | INTRAMUSCULAR | Status: DC
  Administered 2022-01-07: 2 [IU] via SUBCUTANEOUS
  Administered 2022-01-07 – 2022-01-08 (×2): 1 [IU] via SUBCUTANEOUS
  Administered 2022-01-09: 2 [IU] via SUBCUTANEOUS
  Administered 2022-01-09 – 2022-01-11 (×4): 1 [IU] via SUBCUTANEOUS
  Administered 2022-01-11: 2 [IU] via SUBCUTANEOUS
  Filled 2022-01-04: qty 0.09

## 2022-01-04 NOTE — ED Notes (Signed)
Pt ambulating in hall. Tolerated well for about 20 feet with a walker, then began feeling dizzy and weak, stating she was going to pass out. Pt placed in W/C and taken back to room. Upon entering patient's room, she had a syncopal event and eyes rolled back. Pt unresponsive for about 3 seconds, then came to. Pt does not recall event. Pt maintained respirations and had a strong radial pulse throughout event. CBG obtained. Pt assisted back to bed and MD notified of event.
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
## 2022-01-04 NOTE — ED Notes (Addendum)
Delay in lab draw. Patient in X-ray.

## 2022-01-04 NOTE — ED Provider Notes (Signed)
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 recorder   External hemorrhoids 06/07/2010   Family history of adverse reaction to anesthesia    Daughter has severe N&V   Gastric antral vascular ectasia    source for gi bleed in 07/2011 - Xarelto stopped   GERD (gastroesophageal reflux disease)    Gout    Hyperlipidemia    Hypertension    Hypothyroidism    Neuromuscular disorder ([place])    neuropathy in feet   Obesity    Personal history of colonic polyps 06/06/2009   cecal polyp   Rheumatoid arthritis ([place])    Shingles 2023   Sleep apnea    lost weight no longer needs   Type 2 diabetes mellitus with diabetic chronic kidney disease ([place]) 04/25/2006          Dispostion: Disposition decision including need for  hospitalization was considered, and patient was signed out to Dr. Roderic Palau pending CK, and re-assessment.     Final Clinical Impression(s) / ED Diagnoses Final diagnoses:  Contusion of right lower extremity, initial encounter     This chart was dictated using voice recognition software.  Despite best efforts to proofread,  errors can occur which can change the documentation meaning.    Cristie Hem, MD 01/04/22 1724
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
## 2022-01-04 NOTE — ED Notes (Signed)
Pt care taken, asked the doctor and he said she could eat. Awaiting admission.

## 2022-01-05 ENCOUNTER — Ambulatory Visit: Payer: Medicare Other | Admitting: Student

## 2022-01-05 ENCOUNTER — Inpatient Hospital Stay (HOSPITAL_COMMUNITY): Payer: Medicare Other

## 2022-01-05 DIAGNOSIS — R55 Syncope and collapse: Secondary | ICD-10-CM | POA: Diagnosis not present

## 2022-01-05 DIAGNOSIS — S8011XA Contusion of right lower leg, initial encounter: Secondary | ICD-10-CM

## 2022-01-05 DIAGNOSIS — S7002XA Contusion of left hip, initial encounter: Secondary | ICD-10-CM

## 2022-01-05 LAB — CK: Total CK: 96 U/L (ref 38–234)

## 2022-01-05 LAB — COMPREHENSIVE METABOLIC PANEL
ALT: 15 U/L (ref 0–44)
AST: 16 U/L (ref 15–41)
Albumin: 3.3 g/dL — ABNORMAL LOW (ref 3.5–5.0)
Alkaline Phosphatase: 42 U/L (ref 38–126)
Anion gap: 4 — ABNORMAL LOW (ref 5–15)
BUN: 26 mg/dL — ABNORMAL HIGH (ref 8–23)
CO2: 23 mmol/L (ref 22–32)
Calcium: 9 mg/dL (ref 8.9–10.3)
Chloride: 111 mmol/L (ref 98–111)
Creatinine, Ser: 1.29 mg/dL — ABNORMAL HIGH (ref 0.44–1.00)
GFR, Estimated: 42 mL/min — ABNORMAL LOW (ref >60)
Glucose, Bld: 143 mg/dL — ABNORMAL HIGH (ref 70–99)
Potassium: 3.5 mmol/L (ref 3.5–5.1)
Sodium: 138 mmol/L (ref 135–145)
Total Bilirubin: 0.7 mg/dL (ref 0.3–1.2)
Total Protein: 5.7 g/dL — ABNORMAL LOW (ref 6.5–8.1)

## 2022-01-05 LAB — CBC
HCT: 24.9 % — ABNORMAL LOW (ref 36.0–46.0)
Hemoglobin: 7.7 g/dL — ABNORMAL LOW (ref 12.0–15.0)
MCH: 29.3 pg (ref 26.0–34.0)
MCHC: 30.9 g/dL (ref 30.0–36.0)
MCV: 94.7 fL (ref 80.0–100.0)
Platelets: 166 10*3/uL (ref 150–400)
RBC: 2.63 MIL/uL — ABNORMAL LOW (ref 3.87–5.11)
RDW: 15 % (ref 11.5–15.5)
WBC: 5 10*3/uL (ref 4.0–10.5)
nRBC: 0 % (ref 0.0–0.2)

## 2022-01-05 LAB — HEMOGLOBIN A1C
Hgb A1c MFr Bld: 6.4 % — ABNORMAL HIGH (ref 4.8–5.6)
Mean Plasma Glucose: 136.98 mg/dL

## 2022-01-05 LAB — GLUCOSE, CAPILLARY: Glucose-Capillary: 143 mg/dL — ABNORMAL HIGH (ref 70–99)

## 2022-01-05 LAB — TROPONIN I (HIGH SENSITIVITY)
Troponin I (High Sensitivity): 5 ng/L (ref <18)
Troponin I (High Sensitivity): 5 ng/L (ref <18)

## 2022-01-05 LAB — CBG MONITORING, ED
Glucose-Capillary: 86 mg/dL (ref 70–99)
Glucose-Capillary: 143 mg/dL — ABNORMAL HIGH (ref 70–99)

## 2022-01-05 LAB — ECHOCARDIOGRAM COMPLETE
Area-P 1/2: 3.53 cm2
Calc EF: 63.2 %
S' Lateral: 2.8 cm
Single Plane A2C EF: 65.7 %
Single Plane A4C EF: 67.6 %

## 2022-01-05 LAB — HEMOGLOBIN AND HEMATOCRIT, BLOOD
HCT: 23.7 % — ABNORMAL LOW (ref 36.0–46.0)
Hemoglobin: 7.3 g/dL — ABNORMAL LOW (ref 12.0–15.0)

## 2022-01-05 LAB — TSH: TSH: 2.317 u[IU]/mL (ref 0.350–4.500)

## 2022-01-05 MED ORDER — MELATONIN 3 MG PO TABS
3.0000 mg | ORAL_TABLET | Freq: Once | ORAL | Status: DC
  Filled 2022-01-05: qty 1

## 2022-01-05 NOTE — ED Notes (Signed)
Pt stated R lower leg is becoming more swollen and painful. Warm to touch. DO aware.

## 2022-01-05 NOTE — ED Notes (Signed)
Pt in bed, pt reports positive sensation to touch in R foot, pt has less than three sec cap refill, pt has pedal and post tibial pulses via doppler, sites marked.

## 2022-01-05 NOTE — Progress Notes (Signed)
Echocardiogram 2D Echocardiogram has been performed.  Fidel Levy 01/05/2022, 2:11 PM

## 2022-01-05 NOTE — ED Notes (Signed)
Pt ambulatory to and from the bathroom with steady gait.

## 2022-01-05 NOTE — ED Notes (Signed)
CBG 97 
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
## 2022-01-05 NOTE — ED Notes (Addendum)
Pt given dinner tray.

## 2022-01-06 ENCOUNTER — Inpatient Hospital Stay (HOSPITAL_COMMUNITY): Payer: Medicare Other

## 2022-01-06 DIAGNOSIS — R55 Syncope and collapse: Secondary | ICD-10-CM | POA: Diagnosis not present

## 2022-01-06 LAB — CBC
HCT: 23.1 % — ABNORMAL LOW (ref 36.0–46.0)
Hemoglobin: 7.2 g/dL — ABNORMAL LOW (ref 12.0–15.0)
MCH: 29.4 pg (ref 26.0–34.0)
MCHC: 31.2 g/dL (ref 30.0–36.0)
MCV: 94.3 fL (ref 80.0–100.0)
Platelets: 153 10*3/uL (ref 150–400)
RBC: 2.45 MIL/uL — ABNORMAL LOW (ref 3.87–5.11)
RDW: 15.2 % (ref 11.5–15.5)
WBC: 5.1 10*3/uL (ref 4.0–10.5)
nRBC: 0 % (ref 0.0–0.2)

## 2022-01-06 LAB — URINALYSIS, ROUTINE W REFLEX MICROSCOPIC
Bacteria, UA: NONE SEEN
Bilirubin Urine: NEGATIVE
Glucose, UA: NEGATIVE mg/dL
Hgb urine dipstick: NEGATIVE
Ketones, ur: NEGATIVE mg/dL
Nitrite: NEGATIVE
Protein, ur: NEGATIVE mg/dL
Specific Gravity, Urine: 1.018 (ref 1.005–1.030)
Trans Epithel, UA: 1
pH: 5 (ref 5.0–8.0)

## 2022-01-06 LAB — BASIC METABOLIC PANEL
Anion gap: 4 — ABNORMAL LOW (ref 5–15)
BUN: 22 mg/dL (ref 8–23)
CO2: 24 mmol/L (ref 22–32)
Calcium: 9.2 mg/dL (ref 8.9–10.3)
Chloride: 113 mmol/L — ABNORMAL HIGH (ref 98–111)
Creatinine, Ser: 1 mg/dL (ref 0.44–1.00)
GFR, Estimated: 57 mL/min — ABNORMAL LOW (ref >60)
Glucose, Bld: 140 mg/dL — ABNORMAL HIGH (ref 70–99)
Potassium: 4 mmol/L (ref 3.5–5.1)
Sodium: 141 mmol/L (ref 135–145)

## 2022-01-06 LAB — GLUCOSE, CAPILLARY
Glucose-Capillary: 116 mg/dL — ABNORMAL HIGH (ref 70–99)
Glucose-Capillary: 92 mg/dL (ref 70–99)
Glucose-Capillary: 87 mg/dL (ref 70–99)
Glucose-Capillary: 118 mg/dL — ABNORMAL HIGH (ref 70–99)

## 2022-01-06 MED ORDER — ZINC SULFATE 220 (50 ZN) MG PO CAPS
220.0000 mg | ORAL_CAPSULE | Freq: Every day | ORAL | Status: DC
  Administered 2022-01-06 – 2022-01-11 (×5): 220 mg via ORAL
  Filled 2022-01-06 (×6): qty 1

## 2022-01-06 MED ORDER — VITAMIN D 25 MCG (1000 UNIT) PO TABS
2000.0000 [IU] | ORAL_TABLET | Freq: Two times a day (BID) | ORAL | Status: DC
  Administered 2022-01-06 – 2022-01-11 (×9): 2000 [IU] via ORAL
  Filled 2022-01-06 (×11): qty 2

## 2022-01-06 MED ORDER — VITAMIN C 500 MG PO TABS
1000.0000 mg | ORAL_TABLET | Freq: Every day | ORAL | Status: DC
  Administered 2022-01-06 – 2022-01-11 (×5): 1000 mg via ORAL
  Filled 2022-01-06 (×6): qty 2

## 2022-01-06 MED ORDER — ACETAMINOPHEN 325 MG PO TABS
650.0000 mg | ORAL_TABLET | Freq: Four times a day (QID) | ORAL | Status: DC | PRN
  Administered 2022-01-06 – 2022-01-08 (×7): 650 mg via ORAL
  Filled 2022-01-06 (×8): qty 2

## 2022-01-06 MED ORDER — SODIUM CHLORIDE 0.9 % IV SOLN: INTRAVENOUS | Status: DC

## 2022-01-06 MED ORDER — PREGABALIN 50 MG PO CAPS
50.0000 mg | ORAL_CAPSULE | Freq: Three times a day (TID) | ORAL | Status: DC
  Administered 2022-01-06 – 2022-01-07 (×3): 50 mg via ORAL
  Filled 2022-01-06 (×5): qty 1

## 2022-01-06 NOTE — Progress Notes (Signed)
R calf measuring 41 cm
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
## 2022-01-06 NOTE — Progress Notes (Signed)
PT Cancellation Note  Patient Details Name: Courtney Pena MRN: [id number] DOB: 01/29/1941   Cancelled Treatment:    Reason Eval/Treat Not Completed: Other (comment);Patient not medically ready Pt about to go to CT (r/o fx) will continue efforts to complete PT eval  [facility] 01/06/2022, 3:37 PM

## 2022-01-07 DIAGNOSIS — R55 Syncope and collapse: Secondary | ICD-10-CM | POA: Diagnosis not present

## 2022-01-07 LAB — RENAL FUNCTION PANEL
Albumin: 3 g/dL — ABNORMAL LOW (ref 3.5–5.0)
Anion gap: 6 (ref 5–15)
BUN: 20 mg/dL (ref 8–23)
CO2: 22 mmol/L (ref 22–32)
Calcium: 8.6 mg/dL — ABNORMAL LOW (ref 8.9–10.3)
Chloride: 111 mmol/L (ref 98–111)
Creatinine, Ser: 0.96 mg/dL (ref 0.44–1.00)
GFR, Estimated: 59 mL/min — ABNORMAL LOW (ref >60)
Glucose, Bld: 109 mg/dL — ABNORMAL HIGH (ref 70–99)
Phosphorus: 3.5 mg/dL (ref 2.5–4.6)
Potassium: 3.8 mmol/L (ref 3.5–5.1)
Sodium: 139 mmol/L (ref 135–145)

## 2022-01-07 LAB — CBC WITH DIFFERENTIAL/PLATELET
Abs Immature Granulocytes: 0.06 10*3/uL (ref 0.00–0.07)
Basophils Absolute: 0 10*3/uL (ref 0.0–0.1)
Basophils Relative: 1 %
Eosinophils Absolute: 0.2 10*3/uL (ref 0.0–0.5)
Eosinophils Relative: 3 %
HCT: 21.1 % — ABNORMAL LOW (ref 36.0–46.0)
Hemoglobin: 6.5 g/dL — CL (ref 12.0–15.0)
Immature Granulocytes: 1 %
Lymphocytes Relative: 19 %
Lymphs Abs: 1 10*3/uL (ref 0.7–4.0)
MCH: 29.7 pg (ref 26.0–34.0)
MCHC: 30.8 g/dL (ref 30.0–36.0)
MCV: 96.3 fL (ref 80.0–100.0)
Monocytes Absolute: 0.5 10*3/uL (ref 0.1–1.0)
Monocytes Relative: 10 %
Neutro Abs: 3.6 10*3/uL (ref 1.7–7.7)
Neutrophils Relative %: 66 %
Platelets: 145 10*3/uL — ABNORMAL LOW (ref 150–400)
RBC: 2.19 MIL/uL — ABNORMAL LOW (ref 3.87–5.11)
RDW: 15.5 % (ref 11.5–15.5)
WBC: 5.4 10*3/uL (ref 4.0–10.5)
nRBC: 0 % (ref 0.0–0.2)

## 2022-01-07 LAB — GLUCOSE, CAPILLARY
Glucose-Capillary: 99 mg/dL (ref 70–99)
Glucose-Capillary: 121 mg/dL — ABNORMAL HIGH (ref 70–99)
Glucose-Capillary: 161 mg/dL — ABNORMAL HIGH (ref 70–99)
Glucose-Capillary: 158 mg/dL — ABNORMAL HIGH (ref 70–99)

## 2022-01-07 LAB — HEPATIC FUNCTION PANEL
ALT: 24 U/L (ref 0–44)
AST: 30 U/L (ref 15–41)
Albumin: 2.9 g/dL — ABNORMAL LOW (ref 3.5–5.0)
Alkaline Phosphatase: 46 U/L (ref 38–126)
Bilirubin, Direct: 0.1 mg/dL (ref 0.0–0.2)
Indirect Bilirubin: 0.3 mg/dL (ref 0.3–0.9)
Total Bilirubin: 0.4 mg/dL (ref 0.3–1.2)
Total Protein: 5.7 g/dL — ABNORMAL LOW (ref 6.5–8.1)

## 2022-01-07 LAB — PREPARE RBC (CROSSMATCH)

## 2022-01-07 MED ORDER — SODIUM CHLORIDE 0.9% IV SOLUTION: Freq: Once | INTRAVENOUS | Status: DC

## 2022-01-07 MED ORDER — DIPHENHYDRAMINE HCL 25 MG PO CAPS
25.0000 mg | ORAL_CAPSULE | Freq: Once | ORAL | Status: AC
  Administered 2022-01-07: 25 mg via ORAL
  Filled 2022-01-07: qty 1

## 2022-01-07 NOTE — Progress Notes (Signed)
PT Cancellation Note  Patient Details Name: Courtney Pena MRN: [id number] DOB: 09/05/1940   Cancelled Treatment:    Reason Eval/Treat Not Completed: Medical issues which prohibited therapy  Pt's Hgb dropped to 6.5 and per RN, awaiting PRBCs still at this time.  Pt with R LE hematoma.  Will check back as schedule permits.   [name] 01/07/2022, 2:48 PM Jannette Spanner PT, DPT Physical Therapist [facility] Preferred contact method: Secure Chat Weekend Pager Only: [phone] Office: [phone]
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
## 2022-01-07 NOTE — Progress Notes (Signed)
Orthopaedic Trauma Service (OTS)      Subjective: Patient reports pain as improved.    Objective: Current Vitals Blood pressure 128/62, pulse 81, temperature 98.2 F (36.8 C), temperature source Oral, resp. rate 16, height '5\' 5"'$  (1.651 m), weight 80.3 kg, SpO2 97 %. Vital signs in last 24 hours: Temp:  [97.9 F (36.6 C)-98.2 F (36.8 C)] 98.2 F (36.8 C) (11/12 0500) Pulse Rate:  [76-81] 81 (11/11 2200) Resp:  [14-25] 16 (11/12 0500) BP: (120-142)/(62-80) 128/62 (11/12 0500) SpO2:  [95 %-97 %] 97 % (11/12 0500) Weight:  [80.3 kg] 80.3 kg (11/12 0500)  Intake/Output from previous day: 11/11 0701 - 11/12 0700 In: 215.2 [I.V.:215.2] Out: 350 [Urine:350]  LABS Recent Labs    01/04/22 1353 01/04/22 2355 01/05/22 1750 01/06/22 0528 01/07/22 0434  HGB 10.9* 7.7* 7.3* 7.2* 6.5*   Recent Labs    01/06/22 0528 01/07/22 0434  WBC 5.1 5.4  RBC 2.45* 2.19*  HCT 23.1* 21.1*  PLT 153 145*   Recent Labs    01/06/22 0528 01/07/22 0440  NA 141 139  K 4.0 3.8  CL 113* 111  CO2 24 22  BUN 22 20  CREATININE 1.00 0.96  GLUCOSE 140* 109*  CALCIUM 9.2 8.6*   Recent Labs    01/04/22 1340  INR 1.3*     Physical Exam RLE No wound, contusion stable.  Edema/ swelling improved somewhat.  Sens: DPN, SPN, TN intact  Motor: EHL, FHL, and lessor toe ext and flex all intact grossly  Brisk cap refill, warm to touch  Assessment/Plan: Acute blood loss anemia --> transfusion ordered by Dr. Marthenia Rolling  CT demonstrates contusion right lower leg; no evidence of fracture 1. PT/OT with WBAT and AROM, PROM to tolerance, boot for support and comfort only and not necessary 2. Holding Eliquis 3. Continue Lyrica for short term 4. F/u 8-14 days with [name] if symptoms warrant o/w may cancel  Altamese [place], MD [facility], [facility] [phone]

## 2022-01-08 ENCOUNTER — Inpatient Hospital Stay (HOSPITAL_COMMUNITY): Payer: Medicare Other

## 2022-01-08 DIAGNOSIS — T148XXA Other injury of unspecified body region, initial encounter: Secondary | ICD-10-CM

## 2022-01-08 LAB — TYPE AND SCREEN
ABO/RH(D): O POS
Antibody Screen: POSITIVE
Unit division: 0

## 2022-01-08 LAB — CBC WITH DIFFERENTIAL/PLATELET
Abs Immature Granulocytes: 0.02 10*3/uL (ref 0.00–0.07)
Basophils Absolute: 0 10*3/uL (ref 0.0–0.1)
Basophils Relative: 1 %
Eosinophils Absolute: 0.2 10*3/uL (ref 0.0–0.5)
Eosinophils Relative: 2 %
HCT: 26.6 % — ABNORMAL LOW (ref 36.0–46.0)
Hemoglobin: 8.3 g/dL — ABNORMAL LOW (ref 12.0–15.0)
Immature Granulocytes: 0 %
Lymphocytes Relative: 13 %
Lymphs Abs: 0.9 10*3/uL (ref 0.7–4.0)
MCH: 29.4 pg (ref 26.0–34.0)
MCHC: 31.2 g/dL (ref 30.0–36.0)
MCV: 94.3 fL (ref 80.0–100.0)
Monocytes Absolute: 0.6 10*3/uL (ref 0.1–1.0)
Monocytes Relative: 9 %
Neutro Abs: 5.3 10*3/uL (ref 1.7–7.7)
Neutrophils Relative %: 75 %
Platelets: 155 10*3/uL (ref 150–400)
RBC: 2.82 MIL/uL — ABNORMAL LOW (ref 3.87–5.11)
RDW: 16.2 % — ABNORMAL HIGH (ref 11.5–15.5)
WBC: 7.1 10*3/uL (ref 4.0–10.5)
nRBC: 0 % (ref 0.0–0.2)

## 2022-01-08 LAB — GLUCOSE, CAPILLARY
Glucose-Capillary: 123 mg/dL — ABNORMAL HIGH (ref 70–99)
Glucose-Capillary: 129 mg/dL — ABNORMAL HIGH (ref 70–99)
Glucose-Capillary: 96 mg/dL (ref 70–99)
Glucose-Capillary: 125 mg/dL — ABNORMAL HIGH (ref 70–99)

## 2022-01-08 LAB — PROCALCITONIN: Procalcitonin: <0.1 ng/mL

## 2022-01-08 LAB — BPAM RBC
Blood Product Expiration Date: 202312092359
ISSUE DATE / TIME: 202311122156
Unit Type and Rh: 5100

## 2022-01-08 LAB — FERRITIN: Ferritin: 31 ng/mL (ref 11–307)

## 2022-01-08 LAB — HEMOGLOBIN AND HEMATOCRIT, BLOOD
HCT: 25.6 % — ABNORMAL LOW (ref 36.0–46.0)
Hemoglobin: 8.1 g/dL — ABNORMAL LOW (ref 12.0–15.0)

## 2022-01-08 LAB — CK: Total CK: 69 U/L (ref 38–234)

## 2022-01-08 LAB — VITAMIN B12: Vitamin B-12: 187 pg/mL (ref 180–914)

## 2022-01-08 LAB — FOLATE: Folate: 11.5 ng/mL (ref >5.9)

## 2022-01-08 MED ORDER — ORAL CARE MOUTH RINSE: 15.0000 mL | OROMUCOSAL | Status: DC | PRN

## 2022-01-08 MED ORDER — ACETAMINOPHEN-CODEINE 300-30 MG PO TABS
1.0000 | ORAL_TABLET | Freq: Three times a day (TID) | ORAL | Status: DC | PRN
  Administered 2022-01-11: 1 via ORAL
  Filled 2022-01-08 (×2): qty 1

## 2022-01-08 MED ORDER — ACETAMINOPHEN 325 MG PO TABS
650.0000 mg | ORAL_TABLET | Freq: Four times a day (QID) | ORAL | Status: DC | PRN
  Administered 2022-01-08 – 2022-01-09 (×2): 650 mg via ORAL
  Filled 2022-01-08 (×2): qty 2

## 2022-01-08 MED ORDER — METHOCARBAMOL 500 MG PO TABS
250.0000 mg | ORAL_TABLET | Freq: Three times a day (TID) | ORAL | Status: DC | PRN
  Administered 2022-01-08: 250 mg via ORAL
  Filled 2022-01-08: qty 1

## 2022-01-08 MED ORDER — BISACODYL 10 MG RE SUPP
10.0000 mg | Freq: Every day | RECTAL | Status: DC | PRN
  Administered 2022-01-09: 10 mg via RECTAL
  Filled 2022-01-08: qty 1

## 2022-01-08 MED ORDER — CYANOCOBALAMIN 1000 MCG/ML IJ SOLN
1000.0000 ug | Freq: Once | INTRAMUSCULAR | Status: AC
  Administered 2022-01-08: 1000 ug via SUBCUTANEOUS
  Filled 2022-01-08: qty 1

## 2022-01-08 MED ORDER — POLYETHYLENE GLYCOL 3350 17 G PO PACK
17.0000 g | PACK | Freq: Every day | ORAL | Status: DC
  Administered 2022-01-08 – 2022-01-11 (×4): 17 g via ORAL
  Filled 2022-01-08 (×4): qty 1

## 2022-01-08 MED ORDER — SENNOSIDES-DOCUSATE SODIUM 8.6-50 MG PO TABS
1.0000 | ORAL_TABLET | Freq: Two times a day (BID) | ORAL | Status: DC
  Administered 2022-01-08 – 2022-01-10 (×4): 1 via ORAL
  Filled 2022-01-08 (×4): qty 1
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
## 2022-01-08 NOTE — [place] FL2 (Signed)
[place] MEDICAID FL2 LEVEL OF CARE SCREENING TOOL     IDENTIFICATION  Patient Name: Courtney Pena Birthdate: February 07, 1941 Sex: female Admission Date (Current Location): 01/04/2022  [facility] and [place] Number:  Herbalist and Address:  [facility],  [address] [place], [place]      Provider Number: [id number]  Attending Physician Name and Address:  Lavina Hamman, MD  Relative Name and Phone Number:  Zonia Kief Daughter [phone],SNKNLZ,JQBHA Son [phone],FORD-CURTIS,TRACIE Daughter [phone]    Current Level of Care: SNF Recommended Level of Care: [place] Prior Approval Number:    Date Approved/Denied:   PASRR Number: [id number]  Discharge Plan: SNF    Current Diagnoses: Patient Active Problem List   Diagnosis Date Noted   Syncope 01/04/2022   Cold intolerance of hand 12/18/2021   Adenomatous polyp of colon 10/23/2021   Acquired deformity of the abdomen 01/31/2021   Secondary hypercoagulable state ([place]) 10/03/2020   Rheumatoid arthritis ([place]) 03/21/2020   Tinnitus 03/21/2020   CKD (chronic kidney disease), stage IIIa 12/18/2019   Idiopathic chronic gout of multiple sites without tophus 12/09/2019   Lumbar back pain with radiculopathy affecting left lower extremity 07/30/2019   Preventative health care 11/24/2018   History of CVA (cerebrovascular accident) 11/24/2018   Rotator cuff syndrome of right shoulder 11/24/2018   Hypertensive retinopathy of both eyes 10/14/2018   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 10/14/2018   Vitreomacular adhesion of left eye 10/14/2018   Lower abdominal pain 08/28/2018   Myalgia due to statin 08/28/2018   Diabetic retinopathy, nonproliferative, moderate ([place]) 03/07/2018   Headache 11/28/2017   Chronic diastolic CHF (congestive heart failure) ([place]) 08/16/2017   Post herpetic neuralgia 03/13/2017   Type 2 diabetes mellitus with diabetic neuropathy,  unspecified ([place]) 10/17/2016   Sudden hearing loss, left 09/25/2016   Transaminitis 11/10/2015   History of cholecystectomy 11/09/2015   Chronic anticoag - Xarelto 05//2017, CHADS2CVASC=5 Eliquis 9/20 07/07/2015   Degenerative arthritis of thumb 02/12/2014   Vitamin D deficiency 09/18/2013   Hyperlipidemia associated with type 2 diabetes mellitus ([place]) 08/22/2012   Pseudophakia, both eyes 03/12/2012   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   Anemia due to GI blood loss 08/12/2011   Pulmonary nodule 12/26/2010   Paroxysmal atrial fibrillation ([place]) 12/14/2010   After-cataract obscuring vision 11/20/2010   Hypothyroidism 04/25/2006   Type 2 diabetes mellitus with diabetic chronic kidney disease ([place]) 04/25/2006   HYPERCHOLESTEROLEMIA 04/25/2006   Obesity (BMI 30.0-34.9) 04/25/2006   HYPERTENSION, BENIGN SYSTEMIC 04/25/2006   Coronary atherosclerosis 04/25/2006   Reflux esophagitis 04/25/2006   DIVERTICULOSIS OF COLON 04/25/2006   DJD, UNSPECIFIED 04/25/2006   VERTIGO NOS OR DIZZINESS 04/25/2006   Sleep apnea 04/25/2006    Orientation RESPIRATION BLADDER Height & Weight     Self, Time, Situation, Place  Normal Continent Weight: 80.3 kg Height:  '5\' 5"'$  (165.1 cm)  BEHAVIORAL SYMPTOMS/MOOD NEUROLOGICAL BOWEL NUTRITION STATUS      Continent Diet (Heart Healthy)  AMBULATORY STATUS COMMUNICATION OF NEEDS Skin   Extensive Assist Verbally Skin abrasions, Bruising (Right leg and ankle, Abrasion, Ecchymosis)                       Personal Care Assistance Level of Assistance  Bathing, Feeding, Dressing Bathing Assistance: Limited assistance Feeding assistance: Independent Dressing Assistance: Maximum assistance     Functional Limitations Info  Sight, Hearing, Speech Sight Info: Impaired (Glasses) Hearing Info: Adequate Speech Info: Adequate  SPECIAL CARE FACTORS FREQUENCY  PT (By licensed PT), OT (By licensed OT)     PT Frequency: 5x week OT Frequency: 5x week             Contractures Contractures Info: Not present    Additional Factors Info  Code Status, Allergies Code Status Info: FULL Allergies Info: Actos (Pioglitazone), Lisinopril, Nsaids, Penicillins, Sulfamethoxazole, Flecainide, Hctz (Hydrochlorothiazide), Iodinated Contrast Media, Prednisone, Statins, Gabapentin, Losartan Potassium, Zetia (Ezetimibe)           Current Medications (01/08/2022):  This is the current hospital active medication list Current Facility-Administered Medications  Medication Dose Route Frequency Provider Last Rate Last Admin   acetaminophen (TYLENOL) tablet 650 mg  650 mg Oral Q6H PRN Lavina Hamman, MD       acetaminophen-codeine (TYLENOL #3) 300-30 MG per tablet 1 tablet  1 tablet Oral Q8H PRN Lavina Hamman, MD       albuterol (PROVENTIL) (2.5 MG/3ML) 0.083% nebulizer solution 2.5 mg  2.5 mg Nebulization Q2H PRN Clance Boll, MD       allopurinol (ZYLOPRIM) tablet 100 mg  100 mg Oral Daily Myles Rosenthal A, MD   100 mg at 01/08/22 1020   ascorbic acid (VITAMIN C) tablet 1,000 mg  1,000 mg Oral Daily Ainsley Spinner, PA-C   1,000 mg at 01/08/22 1021   carvedilol (COREG) tablet 25 mg  25 mg Oral BID WC Myles Rosenthal A, MD   25 mg at 01/08/22 1019   cholecalciferol (VITAMIN D3) 25 MCG (1000 UNIT) tablet 2,000 Units  2,000 Units Oral BID Ainsley Spinner, PA-C   2,000 Units at 01/08/22 1020   diltiazem (CARDIZEM CD) 24 hr capsule 360 mg  360 mg Oral Daily Myles Rosenthal A, MD   360 mg at 01/08/22 1020   ferrous sulfate tablet 650 mg  650 mg Oral Daily Myles Rosenthal A, MD   650 mg at 01/08/22 1021   insulin aspart (novoLOG) injection 0-9 Units  0-9 Units Subcutaneous TID WC Clance Boll, MD   1 Units at 01/08/22 1406   levothyroxine (SYNTHROID) tablet 112 mcg  112 mcg Oral Q0600 Myles Rosenthal A, MD   112 mcg at 01/08/22 0551   melatonin tablet 3 mg  3 mg Oral Once Raenette Rover, NP       methocarbamol (ROBAXIN) tablet 250 mg  250 mg Oral  Q8H PRN Lavina Hamman, MD       nitroGLYCERIN (NITROSTAT) SL tablet 0.4 mg  0.4 mg Sublingual Q5 min PRN Clance Boll, MD       ondansetron [facility]) tablet 4 mg  4 mg Oral Q6H PRN Clance Boll, MD       Or   ondansetron [facility]) injection 4 mg  4 mg Intravenous Q6H PRN Clance Boll, MD       Oral care mouth rinse  15 mL Mouth Rinse PRN Dana Allan I, MD       pantoprazole (PROTONIX) EC tablet 40 mg  40 mg Oral Daily Myles Rosenthal A, MD   40 mg at 01/08/22 1020   rosuvastatin (CRESTOR) tablet 10 mg  10 mg Oral Daily Myles Rosenthal A, MD   10 mg at 01/08/22 1021   zinc sulfate capsule 220 mg  220 mg Oral Daily Ainsley Spinner, PA-C   220 mg at 01/08/22 1019     Discharge Medications: Please see discharge summary for a list of discharge medications.  Relevant Imaging Results:  Relevant Lab Results:  Additional Information SS#[id number]  Purcell Mouton, RN

## 2022-01-08 NOTE — TOC Progression Note (Signed)
Transition of Care [facility]) - Progression Note    Patient Details  Name: Courtney Pena MRN: [id number] Date of Birth: Aug 08, 1940  Transition of [facility]) CM/SW Contact  Purcell Mouton, RN Phone Number: 01/08/2022, 10:22 AM  Clinical Narrative:     Spoke with pt's daughter Courtney Pena concerning pt's disposition. PT is with pt at present time.   Expected Discharge Plan: [place] Barriers to Discharge: No Barriers Identified  Expected Discharge Plan and Services Expected Discharge Plan: [place]   Discharge Planning Services: CM Consult Post Acute Care Choice: [place] arrangements for the past 2 months: Single Family Home                                       Social Determinants of Health (SDOH) Interventions    Readmission Risk Interventions     No data to display
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
## 2022-01-08 NOTE — TOC Progression Note (Addendum)
Transition of Care [facility]) - Progression Note    Patient Details  Name: Courtney Pena MRN: [id number] Date of Birth: Apr 21, 1940  Transition of Care [facility]) CM/SW Contact  Purcell Mouton, RN Phone Number: 01/08/2022, 11:40 AM  Clinical Narrative:    Spoke with pt concerning SNF. Pt really do not want to go to SNF. Pt's daughter asked if pt qualify for CIR. Will ask CIR to review pt.    Expected Discharge Plan: [place] Barriers to Discharge: No Barriers Identified  Expected Discharge Plan and Services Expected Discharge Plan: [place]   Discharge Planning Services: CM Consult Post Acute Care Choice: [place] arrangements for the past 2 months: Single Family Home                                       Social Determinants of Health (SDOH) Interventions    Readmission Risk Interventions     No data to display
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
## 2022-01-09 ENCOUNTER — Telehealth: Payer: Self-pay

## 2022-01-09 DIAGNOSIS — D62 Acute posthemorrhagic anemia: Secondary | ICD-10-CM | POA: Diagnosis present

## 2022-01-09 DIAGNOSIS — S8011XA Contusion of right lower leg, initial encounter: Secondary | ICD-10-CM | POA: Diagnosis present

## 2022-01-09 DIAGNOSIS — J9811 Atelectasis: Secondary | ICD-10-CM | POA: Diagnosis present

## 2022-01-09 DIAGNOSIS — R55 Syncope and collapse: Secondary | ICD-10-CM | POA: Diagnosis not present

## 2022-01-09 LAB — CBC WITH DIFFERENTIAL/PLATELET
Abs Immature Granulocytes: 0.03 10*3/uL (ref 0.00–0.07)
Basophils Absolute: 0 10*3/uL (ref 0.0–0.1)
Basophils Relative: 1 %
Eosinophils Absolute: 0.2 10*3/uL (ref 0.0–0.5)
Eosinophils Relative: 3 %
HCT: 26.2 % — ABNORMAL LOW (ref 36.0–46.0)
Hemoglobin: 8.1 g/dL — ABNORMAL LOW (ref 12.0–15.0)
Immature Granulocytes: 1 %
Lymphocytes Relative: 14 %
Lymphs Abs: 0.9 10*3/uL (ref 0.7–4.0)
MCH: 28.8 pg (ref 26.0–34.0)
MCHC: 30.9 g/dL (ref 30.0–36.0)
MCV: 93.2 fL (ref 80.0–100.0)
Monocytes Absolute: 0.5 10*3/uL (ref 0.1–1.0)
Monocytes Relative: 8 %
Neutro Abs: 4.8 10*3/uL (ref 1.7–7.7)
Neutrophils Relative %: 73 %
Platelets: 170 10*3/uL (ref 150–400)
RBC: 2.81 MIL/uL — ABNORMAL LOW (ref 3.87–5.11)
RDW: 16 % — ABNORMAL HIGH (ref 11.5–15.5)
WBC: 6.5 10*3/uL (ref 4.0–10.5)
nRBC: 0 % (ref 0.0–0.2)

## 2022-01-09 LAB — MAGNESIUM: Magnesium: 1.8 mg/dL (ref 1.7–2.4)

## 2022-01-09 LAB — COMPREHENSIVE METABOLIC PANEL
ALT: 46 U/L — ABNORMAL HIGH (ref 0–44)
AST: 43 U/L — ABNORMAL HIGH (ref 15–41)
Albumin: 2.8 g/dL — ABNORMAL LOW (ref 3.5–5.0)
Alkaline Phosphatase: 69 U/L (ref 38–126)
Anion gap: 7 (ref 5–15)
BUN: 22 mg/dL (ref 8–23)
CO2: 22 mmol/L (ref 22–32)
Calcium: 9.1 mg/dL (ref 8.9–10.3)
Chloride: 109 mmol/L (ref 98–111)
Creatinine, Ser: 1.17 mg/dL — ABNORMAL HIGH (ref 0.44–1.00)
GFR, Estimated: 47 mL/min — ABNORMAL LOW (ref >60)
Glucose, Bld: 141 mg/dL — ABNORMAL HIGH (ref 70–99)
Potassium: 4.4 mmol/L (ref 3.5–5.1)
Sodium: 138 mmol/L (ref 135–145)
Total Bilirubin: 0.8 mg/dL (ref 0.3–1.2)
Total Protein: 6.1 g/dL — ABNORMAL LOW (ref 6.5–8.1)

## 2022-01-09 LAB — GLUCOSE, CAPILLARY
Glucose-Capillary: 134 mg/dL — ABNORMAL HIGH (ref 70–99)
Glucose-Capillary: 141 mg/dL — ABNORMAL HIGH (ref 70–99)
Glucose-Capillary: 186 mg/dL — ABNORMAL HIGH (ref 70–99)
Glucose-Capillary: 105 mg/dL — ABNORMAL HIGH (ref 70–99)

## 2022-01-09 MED ORDER — FLEET ENEMA 7-19 GM/118ML RE ENEM
1.0000 | ENEMA | Freq: Once | RECTAL | Status: DC | PRN
  Filled 2022-01-09: qty 1

## 2022-01-09 MED ORDER — B COMPLEX-C PO TABS
1.0000 | ORAL_TABLET | Freq: Every day | ORAL | Status: DC
  Administered 2022-01-09 – 2022-01-11 (×2): 1 via ORAL
  Filled 2022-01-09 (×3): qty 1

## 2022-01-09 MED ORDER — ENSURE ENLIVE PO LIQD
237.0000 mL | Freq: Two times a day (BID) | ORAL | Status: DC
  Administered 2022-01-09 – 2022-01-11 (×2): 237 mL via ORAL

## 2022-01-09 MED ORDER — ACETAMINOPHEN 500 MG PO TABS
500.0000 mg | ORAL_TABLET | Freq: Three times a day (TID) | ORAL | Status: DC
  Administered 2022-01-09 – 2022-01-11 (×6): 500 mg via ORAL
  Filled 2022-01-09 (×6): qty 1

## 2022-01-09 MED ORDER — PROSOURCE PLUS PO LIQD
30.0000 mL | Freq: Two times a day (BID) | ORAL | Status: DC
  Administered 2022-01-09 – 2022-01-11 (×3): 30 mL via ORAL
  Filled 2022-01-09 (×3): qty 30
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
## 2022-01-09 NOTE — Progress Notes (Signed)
Inpatient Rehab Admissions Coordinator:   Per therapy recommendations, patient was screened for CIR candidacy by Zadia Uhde, MS, CCC-SLP. At this time, Pt. does not appear to demonstrate medical necessity to justify in hospital rehabilitation/CIR. will not pursue a rehab consult for this Pt.   Recommend other rehab venues to be pursued.  Please contact me with any questions.  Konnor Jorden, MS, CCC-SLP Rehab Admissions Coordinator  [phone] (celll) [phone] (office)   

## 2022-01-09 NOTE — TOC Progression Note (Signed)
Transition of Care [facility]) - Progression Note    Patient Details  Name: Courtney Pena MRN: [id number] Date of Birth: November 10, 1940  Transition of Care [facility]) CM/SW Contact  Purcell Mouton, RN Phone Number: 01/09/2022, 11:58 AM  Clinical Narrative:     Spoke with pt's daughter concerning SNF. Sharyn Lull asked to fax pt to [facility], [employer], [employer].   Expected Discharge Plan: [place] Barriers to Discharge: No Barriers Identified  Expected Discharge Plan and Services Expected Discharge Plan: [place]   Discharge Planning Services: CM Consult Post Acute Care Choice: [place] arrangements for the past 2 months: Single Family Home                                       Social Determinants of Health (SDOH) Interventions    Readmission Risk Interventions     No data to display

## 2022-01-09 NOTE — Progress Notes (Signed)
Patient c/o new urinary incontinence. Per patient, she never has incontinent issues before admission. His afternoon, patient sat on the [facility] earlier and did not urinate much. Once back in bed, patient sneezed and urinated through the bedpad and sheets. Pt now states urine continues to "squirt out" on its own. Bladder scan done showing 223 ml (after incontinence in bed). Pt has not had a BM since 01/04/22. Miralax and Senna has been given without success. MD Posey Pronto notified who recommended Dulcolax supp. Pt agreed. Dulc supp given at [place].

## 2022-01-09 NOTE — Consult Note (Signed)
[place] Nurse Consult Note: Reason for Consult: WOC consult requested for right leg.  Performed remotely after review of progress notes and photos in the EMR.  Pt has been evaluated by the ortho service for contusion/hematoma to right lower leg.  Generalized edema, bruising, edema, and posterior leg with a full thickness abrasion which is red and moist.  Dressing procedure/placement/frequency: Topical treatment plan provided for bedside nurses to perform as follows to promote moist healing: Apply Xeroform gauze to open wounds on right leg Q day, then cover with foam dressings.  (Change foam dressings Q 3 days or PRN soiling.) Please re-consult if further assistance is needed.  Thank-you,  Julien Girt MSN, [place], [place], [place], [place]
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
## 2022-01-09 NOTE — Telephone Encounter (Signed)
Mailed [place] re-enrollment application to pt's home.
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
## 2022-01-10 LAB — MISC LABCORP TEST (SEND OUT): Labcorp test code: 81950

## 2022-01-10 LAB — CBC
HCT: 24.3 % — ABNORMAL LOW (ref 36.0–46.0)
HCT: 24.5 % — ABNORMAL LOW (ref 36.0–46.0)
Hemoglobin: 7.7 g/dL — ABNORMAL LOW (ref 12.0–15.0)
Hemoglobin: 7.7 g/dL — ABNORMAL LOW (ref 12.0–15.0)
MCH: 29.2 pg (ref 26.0–34.0)
MCH: 29.2 pg (ref 26.0–34.0)
MCHC: 31.7 g/dL (ref 30.0–36.0)
MCHC: 31.4 g/dL (ref 30.0–36.0)
MCV: 92 fL (ref 80.0–100.0)
MCV: 92.8 fL (ref 80.0–100.0)
Platelets: 192 10*3/uL (ref 150–400)
Platelets: 199 10*3/uL (ref 150–400)
RBC: 2.64 MIL/uL — ABNORMAL LOW (ref 3.87–5.11)
RBC: 2.64 MIL/uL — ABNORMAL LOW (ref 3.87–5.11)
RDW: 15.9 % — ABNORMAL HIGH (ref 11.5–15.5)
RDW: 15.9 % — ABNORMAL HIGH (ref 11.5–15.5)
WBC: 5.5 10*3/uL (ref 4.0–10.5)
WBC: 4.9 10*3/uL (ref 4.0–10.5)
nRBC: 0 % (ref 0.0–0.2)
nRBC: 0 % (ref 0.0–0.2)

## 2022-01-10 LAB — GLUCOSE, CAPILLARY
Glucose-Capillary: 109 mg/dL — ABNORMAL HIGH (ref 70–99)
Glucose-Capillary: 120 mg/dL — ABNORMAL HIGH (ref 70–99)
Glucose-Capillary: 130 mg/dL — ABNORMAL HIGH (ref 70–99)
Glucose-Capillary: 134 mg/dL — ABNORMAL HIGH (ref 70–99)

## 2022-01-10 LAB — COMPREHENSIVE METABOLIC PANEL
ALT: 43 U/L (ref 0–44)
AST: 35 U/L (ref 15–41)
Albumin: 2.6 g/dL — ABNORMAL LOW (ref 3.5–5.0)
Alkaline Phosphatase: 73 U/L (ref 38–126)
Anion gap: 6 (ref 5–15)
BUN: 23 mg/dL (ref 8–23)
CO2: 23 mmol/L (ref 22–32)
Calcium: 9 mg/dL (ref 8.9–10.3)
Chloride: 109 mmol/L (ref 98–111)
Creatinine, Ser: 0.98 mg/dL (ref 0.44–1.00)
GFR, Estimated: 58 mL/min — ABNORMAL LOW (ref >60)
Glucose, Bld: 130 mg/dL — ABNORMAL HIGH (ref 70–99)
Potassium: 4.4 mmol/L (ref 3.5–5.1)
Sodium: 138 mmol/L (ref 135–145)
Total Bilirubin: 0.9 mg/dL (ref 0.3–1.2)
Total Protein: 6 g/dL — ABNORMAL LOW (ref 6.5–8.1)

## 2022-01-10 LAB — MAGNESIUM: Magnesium: 1.9 mg/dL (ref 1.7–2.4)

## 2022-01-10 MED ORDER — SENNOSIDES-DOCUSATE SODIUM 8.6-50 MG PO TABS
2.0000 | ORAL_TABLET | Freq: Two times a day (BID) | ORAL | Status: DC
  Administered 2022-01-11: 2 via ORAL
  Filled 2022-01-10: qty 2

## 2022-01-10 MED ORDER — APIXABAN 5 MG PO TABS
5.0000 mg | ORAL_TABLET | Freq: Two times a day (BID) | ORAL | Status: DC
  Administered 2022-01-10 – 2022-01-11 (×2): 5 mg via ORAL
  Filled 2022-01-10 (×2): qty 1

## 2022-01-10 NOTE — Care Management Important Message (Signed)
Important Message  Patient Details IM Letter given to the Patient Name: Courtney Pena MRN: [id number] Date of Birth: 11-Oct-1940   Medicare Important Message Given:  Yes     Kerin Salen 01/10/2022, 12:12 PM
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
## 2022-01-10 NOTE — Progress Notes (Addendum)
Pt refused Fleets enema and will rest at current time, decided to wait and keep on hand for later. May decide later in shift or am. Will follow up or hand off to oncoming RN SP, RN

## 2022-01-11 ENCOUNTER — Inpatient Hospital Stay (HOSPITAL_COMMUNITY): Payer: Medicare Other

## 2022-01-11 LAB — CBC
HCT: 23.9 % — ABNORMAL LOW (ref 36.0–46.0)
HCT: 27.3 % — ABNORMAL LOW (ref 36.0–46.0)
Hemoglobin: 7.4 g/dL — ABNORMAL LOW (ref 12.0–15.0)
Hemoglobin: 8.5 g/dL — ABNORMAL LOW (ref 12.0–15.0)
MCH: 29.2 pg (ref 26.0–34.0)
MCH: 29.3 pg (ref 26.0–34.0)
MCHC: 31 g/dL (ref 30.0–36.0)
MCHC: 31.1 g/dL (ref 30.0–36.0)
MCV: 94.5 fL (ref 80.0–100.0)
MCV: 94.1 fL (ref 80.0–100.0)
Platelets: 210 10*3/uL (ref 150–400)
Platelets: 254 10*3/uL (ref 150–400)
RBC: 2.53 MIL/uL — ABNORMAL LOW (ref 3.87–5.11)
RBC: 2.9 MIL/uL — ABNORMAL LOW (ref 3.87–5.11)
RDW: 15.8 % — ABNORMAL HIGH (ref 11.5–15.5)
RDW: 15.9 % — ABNORMAL HIGH (ref 11.5–15.5)
WBC: 4.9 10*3/uL (ref 4.0–10.5)
WBC: 4.9 10*3/uL (ref 4.0–10.5)
nRBC: 0 % (ref 0.0–0.2)
nRBC: 0 % (ref 0.0–0.2)

## 2022-01-11 LAB — RETICULOCYTES
Immature Retic Fract: 25.1 % — ABNORMAL HIGH (ref 2.3–15.9)
RBC.: 2.89 MIL/uL — ABNORMAL LOW (ref 3.87–5.11)
Retic Count, Absolute: 83.2 10*3/uL (ref 19.0–186.0)
Retic Ct Pct: 2.9 % (ref 0.4–3.1)

## 2022-01-11 LAB — IRON AND TIBC
Iron: 43 ug/dL (ref 28–170)
Saturation Ratios: 14 % (ref 10.4–31.8)
TIBC: 298 ug/dL (ref 250–450)
UIBC: 255 ug/dL

## 2022-01-11 LAB — GLUCOSE, CAPILLARY
Glucose-Capillary: 123 mg/dL — ABNORMAL HIGH (ref 70–99)
Glucose-Capillary: 165 mg/dL — ABNORMAL HIGH (ref 70–99)

## 2022-01-11 MED ORDER — ZINC SULFATE 220 (50 ZN) MG PO CAPS: 220.0000 mg | ORAL_CAPSULE | Freq: Every day | ORAL | 0 refills | Status: DC

## 2022-01-11 MED ORDER — ENSURE ENLIVE PO LIQD: 237.0000 mL | Freq: Two times a day (BID) | ORAL | 0 refills | Status: DC

## 2022-01-11 MED ORDER — VITAMIN B-12 1000 MCG PO TABS: 1000.0000 ug | ORAL_TABLET | Freq: Every day | ORAL | 0 refills | Status: DC

## 2022-01-11 MED ORDER — ACETAMINOPHEN-CODEINE 300-30 MG PO TABS: 1.0000 | ORAL_TABLET | Freq: Three times a day (TID) | ORAL | 0 refills | Status: DC | PRN

## 2022-01-11 MED ORDER — POLYETHYLENE GLYCOL 3350 17 G PO PACK: 17.0000 g | PACK | Freq: Every day | ORAL | 0 refills | Status: DC

## 2022-01-11 MED ORDER — SENNA 8.6 MG PO TABS: 1.0000 | ORAL_TABLET | Freq: Two times a day (BID) | ORAL | 0 refills | Status: DC | PRN

## 2022-01-11 MED ORDER — DOCUSATE SODIUM 100 MG PO CAPS: 100.0000 mg | ORAL_CAPSULE | Freq: Two times a day (BID) | ORAL | 0 refills | Status: DC

## 2022-01-11 MED ORDER — METHOCARBAMOL 500 MG PO TABS: 250.0000 mg | ORAL_TABLET | Freq: Three times a day (TID) | ORAL | 0 refills | Status: DC | PRN

## 2022-01-11 MED ORDER — ASCORBIC ACID 1000 MG PO TABS: 1000.0000 mg | ORAL_TABLET | Freq: Every day | ORAL | 0 refills | Status: DC

## 2022-01-11 MED ORDER — BISACODYL 10 MG RE SUPP: 10.0000 mg | Freq: Every day | RECTAL | 0 refills | Status: DC | PRN

## 2022-01-11 NOTE — TOC Progression Note (Signed)
Transition of Care [facility]) - Progression Note    Patient Details  Name: Courtney Pena MRN: [id number] Date of Birth: 19-May-1940  Transition of Care [facility]) CM/SW Contact  Purcell Mouton, RN Phone Number: 01/11/2022, 2:44 PM  Clinical Narrative:     [name] was called, RN, pt, and daughter are aware.    Expected Discharge Plan: [place] Barriers to Discharge: No Barriers Identified  Expected Discharge Plan and Services Expected Discharge Plan: [place]   Discharge Planning Services: CM Consult Post Acute Care Choice: [place] arrangements for the past 2 months: Single Family Home Expected Discharge Date: 01/11/22                                     Social Determinants of Health (SDOH) Interventions    Readmission Risk Interventions     No data to display
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
## 2022-01-13 LAB — CULTURE, BLOOD (ROUTINE X 2)
Culture: NO GROWTH
Culture: NO GROWTH
Special Requests: ADEQUATE
Special Requests: ADEQUATE

## 2022-01-27 NOTE — Progress Notes (Signed)
[facility] Summary Report / Loop Recorder 

## 2022-01-29 ENCOUNTER — Ambulatory Visit: Payer: Medicare Other | Attending: Cardiovascular Disease

## 2022-01-29 DIAGNOSIS — I480 Paroxysmal atrial fibrillation: Secondary | ICD-10-CM | POA: Diagnosis not present

## 2022-01-30 LAB — CUP PACEART REMOTE DEVICE CHECK
Date Time Interrogation Session: 20231203230728
Implantable Pulse Generator Implant Date: 20220525

## 2022-02-01 ENCOUNTER — Emergency Department (HOSPITAL_COMMUNITY)
Admission: EM | Admit: 2022-02-01 | Discharge: 2022-02-01 | Disposition: A | Discharge status: Skilled Nursing Facility | Payer: Medicare Other | Attending: Emergency Medicine | Admitting: Emergency Medicine

## 2022-02-01 ENCOUNTER — Encounter (HOSPITAL_COMMUNITY): Payer: Self-pay

## 2022-02-01 ENCOUNTER — Encounter: Payer: Self-pay | Admitting: Family Medicine

## 2022-02-01 ENCOUNTER — Other Ambulatory Visit: Payer: Self-pay

## 2022-02-01 ENCOUNTER — Emergency Department (HOSPITAL_COMMUNITY): Payer: Medicare Other

## 2022-02-01 DIAGNOSIS — E1122 Type 2 diabetes mellitus with diabetic chronic kidney disease: Secondary | ICD-10-CM | POA: Insufficient documentation

## 2022-02-01 DIAGNOSIS — K5900 Constipation, unspecified: Secondary | ICD-10-CM

## 2022-02-01 DIAGNOSIS — I129 Hypertensive chronic kidney disease with stage 1 through stage 4 chronic kidney disease, or unspecified chronic kidney disease: Secondary | ICD-10-CM | POA: Diagnosis not present

## 2022-02-01 DIAGNOSIS — R1031 Right lower quadrant pain: Secondary | ICD-10-CM | POA: Diagnosis present

## 2022-02-01 DIAGNOSIS — Z8679 Personal history of other diseases of the circulatory system: Secondary | ICD-10-CM

## 2022-02-01 DIAGNOSIS — R0789 Other chest pain: Secondary | ICD-10-CM | POA: Insufficient documentation

## 2022-02-01 DIAGNOSIS — E039 Hypothyroidism, unspecified: Secondary | ICD-10-CM | POA: Insufficient documentation

## 2022-02-01 DIAGNOSIS — N189 Chronic kidney disease, unspecified: Secondary | ICD-10-CM | POA: Insufficient documentation

## 2022-02-01 DIAGNOSIS — Z7901 Long term (current) use of anticoagulants: Secondary | ICD-10-CM | POA: Insufficient documentation

## 2022-02-01 DIAGNOSIS — Z7985 Long-term (current) use of injectable non-insulin antidiabetic drugs: Secondary | ICD-10-CM | POA: Diagnosis not present

## 2022-02-01 DIAGNOSIS — Z79899 Other long term (current) drug therapy: Secondary | ICD-10-CM | POA: Diagnosis not present

## 2022-02-01 DIAGNOSIS — I4891 Unspecified atrial fibrillation: Secondary | ICD-10-CM | POA: Insufficient documentation

## 2022-02-01 DIAGNOSIS — Z7984 Long term (current) use of oral hypoglycemic drugs: Secondary | ICD-10-CM | POA: Diagnosis not present

## 2022-02-01 DIAGNOSIS — I2510 Atherosclerotic heart disease of native coronary artery without angina pectoris: Secondary | ICD-10-CM | POA: Diagnosis not present

## 2022-02-01 LAB — COMPREHENSIVE METABOLIC PANEL
ALT: 11 U/L (ref 0–44)
AST: 13 U/L — ABNORMAL LOW (ref 15–41)
Albumin: 3.4 g/dL — ABNORMAL LOW (ref 3.5–5.0)
Alkaline Phosphatase: 80 U/L (ref 38–126)
Anion gap: 9 (ref 5–15)
BUN: 16 mg/dL (ref 8–23)
CO2: 26 mmol/L (ref 22–32)
Calcium: 9.9 mg/dL (ref 8.9–10.3)
Chloride: 103 mmol/L (ref 98–111)
Creatinine, Ser: 0.84 mg/dL (ref 0.44–1.00)
GFR, Estimated: >60 mL/min (ref >60)
Glucose, Bld: 140 mg/dL — ABNORMAL HIGH (ref 70–99)
Potassium: 4 mmol/L (ref 3.5–5.1)
Sodium: 138 mmol/L (ref 135–145)
Total Bilirubin: 0.7 mg/dL (ref 0.3–1.2)
Total Protein: 7.3 g/dL (ref 6.5–8.1)

## 2022-02-01 LAB — CBC WITH DIFFERENTIAL/PLATELET
Abs Immature Granulocytes: 0.02 10*3/uL (ref 0.00–0.07)
Basophils Absolute: 0 10*3/uL (ref 0.0–0.1)
Basophils Relative: 1 %
Eosinophils Absolute: 0.2 10*3/uL (ref 0.0–0.5)
Eosinophils Relative: 3 %
HCT: 31.8 % — ABNORMAL LOW (ref 36.0–46.0)
Hemoglobin: 9.6 g/dL — ABNORMAL LOW (ref 12.0–15.0)
Immature Granulocytes: 0 %
Lymphocytes Relative: 11 %
Lymphs Abs: 0.7 10*3/uL (ref 0.7–4.0)
MCH: 28.8 pg (ref 26.0–34.0)
MCHC: 30.2 g/dL (ref 30.0–36.0)
MCV: 95.5 fL (ref 80.0–100.0)
Monocytes Absolute: 0.3 10*3/uL (ref 0.1–1.0)
Monocytes Relative: 5 %
Neutro Abs: 5.4 10*3/uL (ref 1.7–7.7)
Neutrophils Relative %: 80 %
Platelets: 270 10*3/uL (ref 150–400)
RBC: 3.33 MIL/uL — ABNORMAL LOW (ref 3.87–5.11)
RDW: 17 % — ABNORMAL HIGH (ref 11.5–15.5)
WBC: 6.6 10*3/uL (ref 4.0–10.5)
nRBC: 0 % (ref 0.0–0.2)

## 2022-02-01 LAB — LIPASE, BLOOD: Lipase: 40 U/L (ref 11–51)

## 2022-02-01 LAB — TROPONIN I (HIGH SENSITIVITY)
Troponin I (High Sensitivity): 4 ng/L (ref <18)
Troponin I (High Sensitivity): 3 ng/L (ref <18)

## 2022-02-01 MED ORDER — SODIUM CHLORIDE 0.9 % IV BOLUS
1000.0000 mL | Freq: Once | INTRAVENOUS | Status: AC
  Administered 2022-02-01: 1000 mL via INTRAVENOUS

## 2022-02-01 MED ORDER — MORPHINE SULFATE (PF) 4 MG/ML IV SOLN: 4.0000 mg | Freq: Once | INTRAVENOUS | Status: DC

## 2022-02-01 NOTE — ED Notes (Signed)
Patient needs to be transport by [facility] back to facility, they state she will be the second one on the list.

## 2022-02-01 NOTE — ED Triage Notes (Signed)
BIB [facility] from [facility] for RLQ abd tightness x2 days.  Denies n/v/d LBM-2 days ago.  Pt states still has appendix.

## 2022-02-01 NOTE — ED Provider Notes (Signed)
[facility] DEPT Provider Note   CSN: [id number] Arrival date & time: 02/01/22  3662     History  Chief Complaint  Patient presents with   Abdominal Pain    Courtney Pena is a 81 y.o. female. Past Medical History:  Diagnosis Date   Allergy    Arthritis    Atrial flutter ([place]) 12/07/2010   converted in ED with 300 mg flecainide   CAD (coronary artery disease)    a. mild per cath in 2004;  b. nonischemic Myoview in March 2012;  c. Lex MV 1/14:  EF 66%, no ischemia   Cataract    Dysrhythmia    A-Fib. 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 Worse more recently (last 3 days). Tightness on right side began a few days ago. Endorses pain and pressure as well. Pain 6 or 7 out of 10. At night and when lying down, pain is worse. Pain is on the right side. No radiation or migration. Doesn't hurt worse after eating. Mild improvement after BM this evening.    Had shingles last year and developed knot that bulged out afterward. 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 Did exploratory surgery during cholecystectomy and did not find anything.   Also came in today for afib after episode that bothered her with tightness in chest. Takes diltiazem and eliquis. Takes  higher dose diltiazem if symptomatic.     Home Medications Prior to Admission medications   Medication Sig Start Date End Date Taking? Authorizing Provider  acetaminophen (TYLENOL) 650 MG CR tablet Take 650 mg by mouth every 8 (eight) hours as needed for pain.    [provider]  acetaminophen-codeine (TYLENOL #3) 300-30 MG tablet Take 1 tablet by mouth every 8 (eight) hours as needed for moderate pain or severe pain. 01/11/22   Lavina Hamman, MD  allopurinol (ZYLOPRIM) 100 MG tablet Take 1 tablet by mouth daily. 10/17/21   [provider]  apixaban (ELIQUIS) 5 MG TABS tablet Take 1 tablet (5 mg total) by mouth 2 (two) times daily. 03/21/20   [name], MD  Apoaequorin (PREVAGEN PO) Take 1 tablet by mouth daily.    [provider]  ascorbic acid (VITAMIN C) 1000 MG tablet Take 1 tablet (1,000 mg total) by mouth daily. 01/12/22   Lavina Hamman, MD  bisacodyl (DULCOLAX) 10 MG suppository Place 1 suppository (10 mg total) rectally daily as needed for moderate constipation. 01/11/22   Lavina Hamman, MD  blood glucose meter kit and supplies Dispense based on patient and insurance preference. Use up to four times daily as directed. (FOR ICD-10 E10.9, E11.9). 09/14/19  [name], MD  carvedilol (COREG) 25 MG tablet Take 1 tablet (25 mg total) by mouth 2 (two) times daily with a meal. 12/18/21   Hensel, Jamal Collin, MD  Cholecalciferol (VITAMIN D3) 2000 units TABS Take 4,000 Units by mouth daily.    [provider]  Coenzyme Q10 (COQ10) 100 MG CAPS Take 100 mg by mouth daily. 06/28/05   [provider]  colchicine 0.6 MG tablet Take 1 tablet (0.6 mg total) by mouth as needed. Patient taking differently: Take 0.6 mg by mouth daily as needed (gout). 12/05/20   [name], MD  cyanocobalamin (VITAMIN B12) 1000 MCG tablet Take 1 tablet (1,000 mcg total) by mouth daily. 01/11/22   Lavina Hamman, MD  diltiazem (CARDIZEM CD) 360 MG 24 hr  capsule TAKE 1 CAPSULE BY MOUTH  DAILY Patient taking differently: Take 360 mg by mouth daily. 05/29/21   [name], MD  docusate sodium (COLACE) 100 MG capsule Take 1 capsule (100 mg total) by mouth 2 (two) times daily. 01/11/22   Lavina Hamman, MD  feeding supplement (ENSURE ENLIVE / ENSURE PLUS) LIQD Take 237 mLs by mouth 2 (two) times daily between meals. 01/11/22   Lavina Hamman, MD  ferrous sulfate 325 (65 FE) MG EC tablet Take 650 mg by mouth daily.     [provider]  glucose blood (ONETOUCH ULTRA) test strip 1 Container by Other route every morning. 05/21/16   [provider]  Insulin Pen Needle 31G X 8 MM MISC Use with pen to inject once daily. 05/13/18   [name], MD  Lancets [facility] ULTRASOFT) lancets Use to check blood sugar daily. 02/13/17   [name], MD  levothyroxine (SYNTHROID) 112 MCG tablet TAKE 1 TABLET BY MOUTH DAILY  BEFORE BREAKFAST Patient taking differently: Take 112 mcg by mouth daily before breakfast. 03/27/21   Hensel, Jamal Collin, MD  meclizine (ANTIVERT) 25 MG tablet TAKE 1 TABLET (25 MG TOTAL) BY MOUTH AS NEEDED. Patient taking differently: Take 25 mg by mouth daily as needed for dizziness. 09/29/20   [name], MD  metFORMIN (GLUCOPHAGE-XR) 500 MG 24 hr tablet TAKE 4 TABLETS BY MOUTH  DAILY Patient taking differently: Take 2,000 mg by mouth every evening. 05/29/21   [name], MD  methocarbamol (ROBAXIN) 500 MG tablet Take 0.5 tablets (250 mg total) by mouth every 8 (eight) hours as needed for muscle spasms. 01/11/22   Lavina Hamman, MD  Multiple Vitamin (MULTI-VITAMINS) TABS Take 1 tablet by mouth daily. 06/28/05   [provider]  nitroGLYCERIN (NITROSTAT) 0.4 MG SL tablet Place 1 tablet (0.4 mg total) under the tongue every 5 (five) minutes as needed for chest pain (Call 911 if chest pain after three doses). 01/03/22   Josue Hector, MD  omeprazole (PRILOSEC) 40 MG capsule TAKE 1 CAPSULE BY MOUTH   DAILY Patient taking differently: Take 40 mg by mouth daily. 02/17/21   [name], MD  ONETOUCH ULTRA test strip USE TO TEST BLOOD SUGAR 3  TIMES DAILY 11/21/20   [name], MD  polyethylene glycol (MIRALAX / GLYCOLAX) 17 g packet Take 17 g by mouth daily. 01/12/22   Lavina Hamman, MD  rosuvastatin (CRESTOR) 10 MG tablet TAKE 1 TABLET BY MOUTH  DAILY Patient taking differently: Take 10 mg by mouth daily. 02/17/21   [name], MD  Semaglutide, 1 MG/DOSE, 2 MG/1.5ML SOPN Inject 1 mg into the skin once  a week. 10/23/21   [name], MD  senna (SENOKOT) 8.6 MG TABS tablet Take 1 tablet (8.6 mg total) by mouth 2 (two) times daily as needed for mild constipation. 01/11/22   Lavina Hamman, MD  zinc sulfate 220 (50 Zn) MG capsule Take 1 capsule (220 mg total) by mouth daily. 01/12/22   Lavina Hamman, MD      Allergies    Actos [pioglitazone], Lisinopril, Nsaids, Penicillins, Sulfamethoxazole, Flecainide, Hctz [hydrochlorothiazide], Iodinated contrast media, Prednisone, Statins, Gabapentin, Losartan potassium, and Zetia [ezetimibe]    Review of Systems   Review of Systems  Gastrointestinal:  Positive for abdominal pain.    Physical Exam Updated Vital Signs BP (!) 145/74   Pulse 63   Temp 97.6 F (36.4 C) (Oral)   Resp 19   SpO2 98%  Physical Exam Constitutional:      General: She is not in acute distress. HENT:     Head: Normocephalic and atraumatic.  Cardiovascular:     Rate and Rhythm: Normal rate and regular rhythm.     Heart sounds: No murmur heard. Pulmonary:     Effort: Pulmonary effort is normal.     Breath sounds: Normal breath sounds.  Abdominal:     Palpations: Abdomen is soft.     Tenderness: There is no abdominal tenderness.  Skin:    General: Skin is warm and dry.  Neurological:     General: No focal deficit present.     Mental Status: She is alert and oriented to person, place, and time.  Psychiatric:        Mood and Affect: Mood  normal.        Behavior: Behavior normal.     ED Results / Procedures / Treatments   Labs (all labs ordered are listed, but only abnormal results are displayed) Labs Reviewed  COMPREHENSIVE METABOLIC PANEL - Abnormal; Notable for the following components:      Result Value   Glucose, Bld 140 (*)    Albumin 3.4 (*)    AST 13 (*)    All other components within normal limits  CBC WITH DIFFERENTIAL/PLATELET - Abnormal; Notable for the following components:   RBC 3.33 (*)    Hemoglobin 9.6 (*)    HCT 31.8 (*)    RDW 17.0 (*)    All other components within normal limits  LIPASE, BLOOD  TROPONIN I (HIGH SENSITIVITY)  TROPONIN I (HIGH SENSITIVITY)    EKG EKG Interpretation  Date/Time:  Thursday February 01 2022 10:27:51 EST Ventricular Rate:  67 PR Interval:  197 QRS Duration: 109 QT Interval:  409 QTC Calculation: 432 R Axis:   46 Text Interpretation: Sinus rhythm Anterior infarct, old No significant change since last tracing Confirmed by Wandra Arthurs [id number] on 02/01/2022 5:33:08 PM  Radiology CT ABDOMEN PELVIS WO CONTRAST  Result Date: 02/01/2022 CLINICAL DATA:  Abdominal pain, acute, nonlocalized. Previous cholecystectomy. EXAM: CT ABDOMEN AND PELVIS WITHOUT CONTRAST TECHNIQUE: Multidetector CT imaging of the abdomen and pelvis was performed following the standard protocol without IV contrast. RADIATION DOSE REDUCTION: This exam was performed according to the departmental dose-optimization program which includes automated exposure control, adjustment of the mA and/or kV according to patient size and/or use of iterative reconstruction technique. COMPARISON:  Radiographs 01/11/2022. Abdominopelvic CT 01/04/2022. Abdominal MRI 02/10/2021. FINDINGS: Lower chest: Trace pleural effusions with mildly increased atelectasis at both lung bases. No confluent airspace opacity. Stable cardiomegaly, small pericardial effusion and coronary artery atherosclerosis. Hepatobiliary: The liver appears  unremarkable as imaged in the noncontrast state. No evidence of biliary dilatation status post cholecystectomy. Pancreas: Mild atrophy. No ductal dilatation or surrounding inflammation. Spleen: Normal in size without focal abnormality. Adrenals/Urinary Tract: Both adrenal glands appear normal. No evidence of urinary tract calculus, suspicious renal lesion or hydronephrosis. Multiple hyperdense and low-density renal lesions are again noted bilaterally, unchanged from the recent prior CT. These correspond with cysts of varying complexity on previous MRI. No specific follow-up imaging recommended. The bladder appears unremarkable for its degree of distention. Stomach/Bowel: Enteric contrast was administered and has passed into the mid colon. The stomach appears unremarkable for its degree of distension. No evidence of bowel wall thickening, distention or surrounding inflammatory change. The appendix appears normal. There is a moderate to large amount of stool in the distal colon, especially the rectum. Vascular/Lymphatic: There are no enlarged abdominal or pelvic lymph nodes. Aortic and branch vessel atherosclerosis without evidence of aneurysm. Reproductive: The uterus and ovaries appear unchanged. Mild uterine lobularity consistent with fibroids. No suspicious adnexal findings. Other: No evidence of abdominal wall mass or hernia. No ascites. Musculoskeletal: No acute or significant osseous findings. Multilevel thoracolumbar spondylosis. Generalized subcutaneous edema has mildly progressed compared with the previous CT. IMPRESSION: 1. No acute findings or clear explanation for the patient's symptoms. 2. Moderate to large amount of stool in the distal colon, especially the rectum, suggesting constipation. 3. Stable cardiomegaly, small pericardial effusion and small pleural effusions. Mildly increased generalized subcutaneous edema. 4. Grossly unchanged renal cystic lesions of varying complexity. 5.  Aortic  Atherosclerosis (ICD10-I70.0). Electronically Signed   By: Richardean Sale M.D.   On: 02/01/2022 13:27    Procedures Procedures    Medications Ordered in ED Medications  sodium chloride 0.9 % bolus 1,000 mL (0 mLs Intravenous Stopped 02/01/22 2047)    ED Course/ Medical Decision Making/ A&P                           Medical Decision Making  Patient presents with months-long history of tightness in her right lower quadrant. CTAP with heavy stool burden but otherwise without acute pathology. Does not appear impacted on DRE. Recently added senakot to home regimen which includes colace and miralax. Had two bowel movements here and endorses some relief. No need for enema or disimpaction at this time. Will discharge on pre-existing bowel regimen.   Patient also with episode of afib with rvr today. Took short acting diltiazem with resolution of symptoms. However, also reported some chest tightness and ECG, troponins ordered. ECG nonischemic. Troponin 4>3. Currently in NSR, asymptomatic. Adherent to eliquis 5 mg daily and diltiazem 360 mg daily.         Final Clinical Impression(s) / ED Diagnoses Final diagnoses:  None    Rx / DC Orders ED Discharge Orders     None         Linward Natal, MD 02/01/22 2109    Drenda Freeze, MD 02/01/22 [phone]

## 2022-02-01 NOTE — ED Provider Triage Note (Signed)
Emergency Medicine Provider Triage Evaluation Note  Courtney Pena , a 81 y.o. female  was evaluated in triage.  Pt complains of RLQ pain x2 days. Hx of cholecystectomy. Reports it feels tight and is constant.  Review of Systems  Positive: Abdominal pain Negative: N/V  Physical Exam  BP 121/73 (BP Location: Left Arm)   Pulse 73   Temp 98.4 F (36.9 C) (Oral)   Resp 16   SpO2 100%  Gen:   Awake, no distress   Resp:  Normal effort  MSK:   Moves extremities without difficulty  Other:  Ttp of R flank  Medical Decision Making  Medically screening exam initiated at 10:08 AM.  Appropriate orders placed.  Bethann Punches [place] was informed that the remainder of the evaluation will be completed by another provider, this initial triage assessment does not replace that evaluation, and the importance of remaining in the ED until their evaluation is complete.   Osvaldo Shipper, [place] 02/01/22 1010

## 2022-02-01 NOTE — Discharge Instructions (Addendum)
You came in with abdominal pain likely related to constipation. Please continue taking your mirilax, colace, and senakot. Please seek medical attention if your symptoms worsen or fail to improve. You also had an episode of atrial fibrillation today with a high heart rate. This has resolved with your usual medications. Additionally, I have no concerns for blockages in the blood vessels in your heart.

## 2022-02-12 ENCOUNTER — Telehealth: Payer: Self-pay

## 2022-02-12 NOTE — Telephone Encounter (Signed)
Transition Care Management Follow-up Telephone Call Date of discharge and from where: [facility] How have you been since you were released from the hospital? better Any questions or concerns? No  Items Reviewed: Did the pt receive and understand the discharge instructions provided? Yes  Medications obtained and verified? Yes  Other? No  Any new allergies since your discharge? No  Dietary orders reviewed? Yes Do you have support at home? Yes   Home Care and Equipment/Supplies: Were home health services ordered? yes If so, what is the name of the agency?unknown  Has the agency set up a time to come to the patient's home? yes Were any new equipment or medical supplies ordered?  Yes: wheelchair shower chair beside comode What is the name of the medical supply agency? unknown Were you able to get the supplies/equipment? yes Do you have any questions related to the use of the equipment or supplies? No  Functional Questionnaire: (I = Independent and D = Dependent) ADLs: D  Bathing/Dressing- D  Meal Prep- D  Eating- I  Maintaining continence- I  Transferring/Ambulation- D  Managing Meds- I  Follow up appointments reviewed:  PCP Hospital f/u appt confirmed? Yes  Scheduled to see Dr Andria Frames on 02/22/2022 @ 8:30. [facility] f/u appt confirmed? Yes  Scheduled to see Dr Layne Benton on 02/14/2022 @ 11:00. Are transportation arrangements needed? No  If their condition worsens, is the pt aware to call PCP or go to the Emergency Dept.? Yes Was the patient provided with contact information for the PCP's office or ED? Yes Was to pt encouraged to call back with questions or concerns? Yes  Juanda Crumble, LPN [facility] [phone]

## 2022-02-13 ENCOUNTER — Telehealth: Payer: Self-pay

## 2022-02-13 NOTE — Telephone Encounter (Signed)
Received notification from [place] regarding approval for OZEMPIC '1MG'$  DOSE PEN. Patient assistance approved from 02/12/22 to 02/26/23.  MEDICATION WILL SHIP TO OFFICE  Phone: [phone]

## 2022-02-15 ENCOUNTER — Other Ambulatory Visit: Payer: Self-pay | Admitting: Family Medicine

## 2022-02-15 DIAGNOSIS — E1122 Type 2 diabetes mellitus with diabetic chronic kidney disease: Secondary | ICD-10-CM

## 2022-02-15 NOTE — Telephone Encounter (Signed)
Noted and appreciate teamwork. 

## 2022-02-22 ENCOUNTER — Encounter: Payer: Self-pay | Admitting: Family Medicine

## 2022-02-22 ENCOUNTER — Ambulatory Visit (INDEPENDENT_AMBULATORY_CARE_PROVIDER_SITE_OTHER): Payer: Medicare Other | Admitting: Family Medicine

## 2022-02-22 VITALS — BP 112/60 | HR 70 | Ht 65.0 in | Wt 157.6 lb

## 2022-02-22 DIAGNOSIS — Z23 Encounter for immunization: Secondary | ICD-10-CM

## 2022-02-22 DIAGNOSIS — I480 Paroxysmal atrial fibrillation: Secondary | ICD-10-CM | POA: Diagnosis not present

## 2022-02-22 DIAGNOSIS — E7800 Pure hypercholesterolemia, unspecified: Secondary | ICD-10-CM | POA: Diagnosis not present

## 2022-02-22 DIAGNOSIS — E1140 Type 2 diabetes mellitus with diabetic neuropathy, unspecified: Secondary | ICD-10-CM

## 2022-02-22 DIAGNOSIS — I5032 Chronic diastolic (congestive) heart failure: Secondary | ICD-10-CM

## 2022-02-22 DIAGNOSIS — I2510 Atherosclerotic heart disease of native coronary artery without angina pectoris: Secondary | ICD-10-CM

## 2022-02-22 NOTE — Assessment & Plan Note (Signed)
Great weight loss.  Good control

## 2022-02-22 NOTE — Patient Instructions (Addendum)
Your blood pressure is great, maybe even too low.  For now, no changes.  Call me if you have any  lightheaded spells and I would decrease one of your medications.   You are due for a colonoscopy and an eye exam.  Please schedule those.   You will be due for an A1C in early February.  See me one more time before I leave on 2/15.
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
## 2022-02-22 NOTE — Assessment & Plan Note (Signed)
>>  ASSESSMENT AND PLAN FOR TYPE 2 DIABETES MELLITUS WITH DIABETIC NEUROPATHY, UNSPECIFIED (HCC) WRITTEN ON 02/22/2022  2:11 PM BY HENSEL, Santiago Bumpers, MD  Great weight loss.  Good control

## 2022-02-22 NOTE — Assessment & Plan Note (Signed)
Well controled on current meds 

## 2022-02-22 NOTE — Assessment & Plan Note (Signed)
Stable off diuretics.  Appears euvolemic on exam.

## 2022-02-22 NOTE — Assessment & Plan Note (Signed)
We have finally found a statin she can take.  On eliquis, not ASA.  Modifiable risk factors controled.

## 2022-02-23 LAB — LIPID PANEL
Chol/HDL Ratio: 1.8 ratio (ref 0.0–4.4)
Cholesterol, Total: 109 mg/dL (ref 100–199)
HDL: 61 mg/dL (ref >39)
LDL Chol Calc (NIH): 29 mg/dL (ref 0–99)
Triglycerides: 107 mg/dL (ref 0–149)
VLDL Cholesterol Cal: 19 mg/dL (ref 5–40)

## 2022-03-05 ENCOUNTER — Ambulatory Visit (INDEPENDENT_AMBULATORY_CARE_PROVIDER_SITE_OTHER): Payer: Medicare Other

## 2022-03-05 DIAGNOSIS — I480 Paroxysmal atrial fibrillation: Secondary | ICD-10-CM | POA: Diagnosis not present

## 2022-03-06 LAB — CUP PACEART REMOTE DEVICE CHECK
Date Time Interrogation Session: 20240107231347
Implantable Pulse Generator Implant Date: 20220525

## 2022-03-09 NOTE — Progress Notes (Signed)
[facility] Summary Report / Loop Recorder

## 2022-03-13 ENCOUNTER — Telehealth: Payer: Self-pay

## 2022-03-13 ENCOUNTER — Ambulatory Visit: Payer: Medicare Other | Admitting: Podiatry

## 2022-03-13 ENCOUNTER — Encounter: Payer: Self-pay | Admitting: Podiatry

## 2022-03-13 ENCOUNTER — Ambulatory Visit (INDEPENDENT_AMBULATORY_CARE_PROVIDER_SITE_OTHER): Payer: Medicare Other

## 2022-03-13 VITALS — BP 122/72 | HR 77

## 2022-03-13 DIAGNOSIS — S9032XA Contusion of left foot, initial encounter: Secondary | ICD-10-CM

## 2022-03-13 DIAGNOSIS — M21371 Foot drop, right foot: Secondary | ICD-10-CM

## 2022-03-13 DIAGNOSIS — S9031XA Contusion of right foot, initial encounter: Secondary | ICD-10-CM

## 2022-03-13 DIAGNOSIS — M722 Plantar fascial fibromatosis: Secondary | ICD-10-CM

## 2022-03-13 MED ORDER — TRIAMCINOLONE ACETONIDE 40 MG/ML IJ SUSP
20.0000 mg | Freq: Once | INTRAMUSCULAR | Status: AC
  Administered 2022-03-13: 20 mg

## 2022-03-13 NOTE — Telephone Encounter (Signed)
Informed pt her novo nordisk order of Ozempic is ready for pickup.   4 pens of ozempic '1mg'$  dose pens are labeled and ready in med room fridge.
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
## 2022-03-21 ENCOUNTER — Ambulatory Visit (HOSPITAL_COMMUNITY)
Admission: RE | Admit: 2022-03-21 | Discharge: 2022-03-21 | Disposition: A | Discharge status: Home / Self Care | Payer: Medicare Other | Source: Ambulatory Visit | Attending: Physician Assistant | Admitting: Physician Assistant

## 2022-03-21 ENCOUNTER — Encounter (HOSPITAL_COMMUNITY): Payer: Self-pay | Admitting: Physician Assistant

## 2022-03-21 VITALS — BP 134/78 | HR 75 | Ht 65.0 in | Wt 153.8 lb

## 2022-03-21 DIAGNOSIS — E119 Type 2 diabetes mellitus without complications: Secondary | ICD-10-CM | POA: Insufficient documentation

## 2022-03-21 DIAGNOSIS — D6869 Other thrombophilia: Secondary | ICD-10-CM | POA: Insufficient documentation

## 2022-03-21 DIAGNOSIS — Z7901 Long term (current) use of anticoagulants: Secondary | ICD-10-CM | POA: Diagnosis not present

## 2022-03-21 DIAGNOSIS — I10 Essential (primary) hypertension: Secondary | ICD-10-CM | POA: Diagnosis not present

## 2022-03-21 DIAGNOSIS — E669 Obesity, unspecified: Secondary | ICD-10-CM | POA: Insufficient documentation

## 2022-03-21 DIAGNOSIS — I480 Paroxysmal atrial fibrillation: Secondary | ICD-10-CM | POA: Insufficient documentation

## 2022-03-21 DIAGNOSIS — I4892 Unspecified atrial flutter: Secondary | ICD-10-CM | POA: Diagnosis not present

## 2022-03-21 DIAGNOSIS — G4733 Obstructive sleep apnea (adult) (pediatric): Secondary | ICD-10-CM | POA: Insufficient documentation

## 2022-03-21 DIAGNOSIS — E785 Hyperlipidemia, unspecified: Secondary | ICD-10-CM | POA: Diagnosis not present
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
## 2022-03-23 ENCOUNTER — Telehealth: Payer: Self-pay

## 2022-03-23 NOTE — Telephone Encounter (Signed)
Tiffany from [employer] for PT and Home health aide verbal orders as follows:  PT orders:  1 time(s) weekly for 3 week(s), then 2 time(s) weekly for 4 week(s), then  1 time weekly for 2 weeks.   HH Aide:  1 time weekly for 2 weeks 2 times weekly for 4 weeks 1 time weekly for 2 weeks.   Verbal orders given per [facility] protocol  Talbot Grumbling, RN

## 2022-03-24 NOTE — Telephone Encounter (Signed)
Noted and agree. 

## 2022-03-27 ENCOUNTER — Other Ambulatory Visit: Payer: Self-pay | Admitting: Family Medicine

## 2022-03-27 DIAGNOSIS — E7800 Pure hypercholesterolemia, unspecified: Secondary | ICD-10-CM

## 2022-03-27 DIAGNOSIS — D500 Iron deficiency anemia secondary to blood loss (chronic): Secondary | ICD-10-CM

## 2022-04-03 ENCOUNTER — Encounter: Payer: Self-pay | Admitting: Family Medicine

## 2022-04-04 ENCOUNTER — Other Ambulatory Visit: Payer: Self-pay | Admitting: Family Medicine

## 2022-04-04 ENCOUNTER — Telehealth: Payer: Self-pay | Admitting: Cardiovascular Disease

## 2022-04-04 DIAGNOSIS — Z1231 Encounter for screening mammogram for malignant neoplasm of breast: Secondary | ICD-10-CM

## 2022-04-04 NOTE — Telephone Encounter (Signed)
When returned call to patient she explains that she checked mychart while awaiting office to call back, and she saw the instructions there, which answered her questions. She appreciates the return call. Advised to call back if she needs anything else/questions. Pt agreeable to plan.
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
## 2022-04-05 ENCOUNTER — Encounter (HOSPITAL_COMMUNITY): Payer: Self-pay | Admitting: *Deleted

## 2022-04-09 ENCOUNTER — Ambulatory Visit: Payer: Medicare Other | Admitting: Family Medicine

## 2022-04-09 ENCOUNTER — Ambulatory Visit: Payer: Medicare Other

## 2022-04-09 ENCOUNTER — Encounter: Payer: Self-pay | Admitting: Family Medicine

## 2022-04-09 VITALS — BP 124/68 | HR 61 | Wt 155.0 lb

## 2022-04-09 DIAGNOSIS — E1140 Type 2 diabetes mellitus with diabetic neuropathy, unspecified: Secondary | ICD-10-CM | POA: Diagnosis not present

## 2022-04-09 DIAGNOSIS — I480 Paroxysmal atrial fibrillation: Secondary | ICD-10-CM

## 2022-04-09 DIAGNOSIS — R531 Weakness: Secondary | ICD-10-CM | POA: Diagnosis not present

## 2022-04-09 MED ORDER — DILTIAZEM HCL 30 MG PO TABS: ORAL_TABLET | ORAL | 6 refills | Status: DC

## 2022-04-09 NOTE — Patient Instructions (Signed)
You look wonderful.   Keep living your best life. Check back in in three months.

## 2022-04-10 ENCOUNTER — Encounter: Payer: Self-pay | Admitting: Family Medicine

## 2022-04-10 LAB — CUP PACEART REMOTE DEVICE CHECK
Date Time Interrogation Session: 20240209230544
Implantable Pulse Generator Implant Date: 20220525
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
## 2022-04-16 NOTE — Telephone Encounter (Signed)
Open in error

## 2022-04-16 NOTE — Progress Notes (Signed)
[facility] Summary Report / Loop Recorder 

## 2022-04-17 ENCOUNTER — Telehealth: Payer: Self-pay

## 2022-04-17 NOTE — Telephone Encounter (Signed)
Submitted re-enrollment for Eliquis PAP to [employer] 03/07/22.  Received notification from [employer] ([place]) regarding patient assistance DENIAL for [employer].   PATIENT HASN'T MET 3% OUT OF POCKET PRESCRIPTION EXPENSE, BASED ON HOUSEHOLD INCOME  Phone:[phone]

## 2022-04-27 ENCOUNTER — Other Ambulatory Visit: Payer: Self-pay | Admitting: Family Medicine

## 2022-04-27 ENCOUNTER — Other Ambulatory Visit (HOSPITAL_COMMUNITY): Payer: Self-pay

## 2022-04-27 DIAGNOSIS — Z7901 Long term (current) use of anticoagulants: Secondary | ICD-10-CM

## 2022-04-27 DIAGNOSIS — I480 Paroxysmal atrial fibrillation: Secondary | ICD-10-CM

## 2022-04-27 MED ORDER — APIXABAN 5 MG PO TABS: 5.0000 mg | ORAL_TABLET | Freq: Two times a day (BID) | ORAL | 0 refills | Status: DC

## 2022-04-27 NOTE — Telephone Encounter (Signed)
Reached out to patient to explain PAP denial. Informed patient that when running a test claim, copay is $25 for a month supply.   Patient would like a 90 day supply of medication sent to [facility] mail order pharmacy.

## 2022-04-27 NOTE — Telephone Encounter (Signed)
90 day supply sent to [facility].  Orginally sent to [facility], but called and cancelled this.  Yehuda Savannah MD

## 2022-04-27 NOTE — Progress Notes (Signed)
Eliquis 5 mg BID - 90day supply sent to home delivery pharmacy.

## 2022-04-27 NOTE — Telephone Encounter (Signed)
Patient calls nurse line in regards to Eliquis.   I see her medication assistance appears to have been denied. Patient stated, "I know it was approved and I need my medicine."   Will forward to [facility] for further clarification.

## 2022-05-02 ENCOUNTER — Ambulatory Visit: Payer: Medicare Other | Admitting: Gastroenterology

## 2022-05-04 ENCOUNTER — Telehealth (HOSPITAL_COMMUNITY): Payer: Self-pay | Admitting: *Deleted

## 2022-05-04 NOTE — Telephone Encounter (Signed)
Reaching out to patient to offer assistance regarding upcoming cardiac imaging study; pt verbalizes understanding of appt date/time, parking situation and where to check in, pre-test NPO status, and verified current allergies; name and call back number provided for further questions should they arise  Agustina Witzke RN Navigator Cardiac Imaging [facility] [phone] office [phone] cell  Patient aware to avoid caffeine 12 hours prior to her cardiac PET scan.  

## 2022-05-07 ENCOUNTER — Ambulatory Visit: Payer: Medicare Other | Admitting: Neurology

## 2022-05-07 ENCOUNTER — Encounter: Payer: Self-pay | Admitting: Neurology

## 2022-05-07 VITALS — BP 139/74 | HR 65 | Ht 65.0 in | Wt 155.0 lb

## 2022-05-07 DIAGNOSIS — M21371 Foot drop, right foot: Secondary | ICD-10-CM
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
## 2022-05-08 ENCOUNTER — Encounter (HOSPITAL_COMMUNITY): Admission: RE | Admit: 2022-05-08 | Payer: Medicare Other | Source: Ambulatory Visit

## 2022-05-14 ENCOUNTER — Ambulatory Visit (INDEPENDENT_AMBULATORY_CARE_PROVIDER_SITE_OTHER): Payer: Medicare Other

## 2022-05-14 DIAGNOSIS — I480 Paroxysmal atrial fibrillation: Secondary | ICD-10-CM

## 2022-05-14 NOTE — Telephone Encounter (Signed)
Patient calls nurse line checking the status of Eliquis prescription.  It appears the prescription sent in on 3/1 was set as "sample."   Patient reports she is completely out of medication.   I called the pharmacy ([facility]) and spoke with pharmacist (edward.)   Original prescription was phoned in by myself. He reports the 90 day supply will be 50.00 dollars. He also reports he will wave the 20.00 dollar overnight shipping fee for patient.   Spoke with patient and she was appreciative. Patient to let us know if she has not received prescription in the next few days.

## 2022-05-16 LAB — CUP PACEART REMOTE DEVICE CHECK
Date Time Interrogation Session: 20240317230614
Implantable Pulse Generator Implant Date: 20220525

## 2022-05-23 ENCOUNTER — Ambulatory Visit: Payer: Medicare Other

## 2022-05-24 NOTE — Progress Notes (Signed)
[facility] Summary Report / Loop Recorder 

## 2022-05-31 ENCOUNTER — Telehealth (INDEPENDENT_AMBULATORY_CARE_PROVIDER_SITE_OTHER): Payer: Medicare Other | Admitting: Family Medicine

## 2022-05-31 ENCOUNTER — Encounter: Payer: Self-pay | Admitting: Family Medicine

## 2022-05-31 DIAGNOSIS — Z20822 Contact with and (suspected) exposure to covid-19: Secondary | ICD-10-CM | POA: Diagnosis not present

## 2022-05-31 NOTE — Telephone Encounter (Signed)
Called patient and scheduled for virtual appointment this afternoon with Dr. Larae Grooms. Instructed on use of mychart video visit. If possible, patient prefers to speak over the phone.   Talbot Grumbling, RN

## 2022-05-31 NOTE — Progress Notes (Signed)
Virtual Visit via Telephone Note  I connected with [name] on 05/31/22 at  2:10 PM EDT by telephone and verified that I am speaking with the correct person using two identifiers.  Location: [place] Patient: Courtney Pena Provider: Donney Dice, DO   I discussed the limitations, risks, security and privacy concerns of performing an evaluation and management service by telephone and the availability of in person appointments. I also discussed with the patient that there may be a patient responsible charge related to this service. The patient expressed understanding and agreed to proceed.   History of Present Illness: Patient has concern of respiratory illness. Recently tested positive for COVID 2 days ago and she took another test that same morning and it was negative. Reports symptoms of headaches, myalgias, rhinorrhea, strange taste to foods and congestion. Her smelling sense is intact. Believes she may have had a fever but did not take her temperature. Denies chest pain and shortness of breath. Denies nausea, vomiting and diarrhea.  Reports that she went to a play at the [facility] this weekend and may have been exposed to sick contacts there. She makes a habit to keep her mask on in public but still ended up having symptoms a few days after.   Observations/Objective: General: Patient in no acute distress. Resp: normal work of breathing noted, speaking in complete sentences without distress, no signs of respiratory distress  Psych: mood appropriate   Assessment and Plan: Symptoms likely secondary to viral illness, very possibly COVID. Patient remains stable with normal respiratory effort. Exam limited given virtual status however seems most consistent with viral etiology. We discussed paxlovid which I advised against this given her chronic anticoagulation on eliquis. Advised her to continue to take all her medications as prescribed, especially her eliquis given  increased hypercoagulable state with COVID infection. Not on insulin so  no adjustments needed to DM regimen. Reassurance provided along and extensively discussed support care measures.   Follow Up Instructions: Strict return and ED precautions discussed, schedule clinic follow up in 1 week if symptoms are not improving. Patient voiced understanding of plan and was appreciative of the visit.    I discussed the assessment and treatment plan with the patient. The patient was provided an opportunity to ask questions and all were answered. The patient agreed with the plan and demonstrated an understanding of the instructions.   The patient was advised to call back or seek an in-person evaluation if the symptoms worsen or if the condition fails to improve as anticipated.  I provided 20 minutes of non-face-to-face time during this encounter.   Donney Dice, DO

## 2022-05-31 NOTE — Telephone Encounter (Signed)
Please call patient--please offer virtual or in person visit with upper level resident (Dr. Larae Grooms has opening in ATC today)  Thank you Dorris Singh, MD  [facility] Medicine Teaching Service

## 2022-06-01 ENCOUNTER — Encounter: Payer: Self-pay | Admitting: Family Medicine

## 2022-06-08 ENCOUNTER — Telehealth (HOSPITAL_COMMUNITY): Payer: Self-pay | Admitting: Emergency Medicine

## 2022-06-08 ENCOUNTER — Encounter (HOSPITAL_COMMUNITY): Payer: Self-pay

## 2022-06-08 NOTE — Telephone Encounter (Signed)
Reaching out to patient to offer assistance regarding upcoming cardiac imaging study; pt verbalizes understanding of appt date/time, parking situation and where to check in, pre-test NPO status and medications ordered, and verified current allergies; name and call back number provided for further questions should they arise Jacqui Headen RN Navigator Cardiac Imaging [facility] [phone] office [phone] cell 

## 2022-06-12 ENCOUNTER — Encounter (HOSPITAL_COMMUNITY)
Admission: RE | Admit: 2022-06-12 | Discharge: 2022-06-12 | Disposition: A | Discharge status: Home / Self Care | Payer: Medicare Other | Source: Ambulatory Visit | Attending: Cardiovascular Disease | Admitting: Cardiovascular Disease

## 2022-06-12 DIAGNOSIS — R072 Precordial pain: Secondary | ICD-10-CM | POA: Insufficient documentation

## 2022-06-12 LAB — NM PET CT CARDIAC PERFUSION MULTI W/ABSOLUTE BLOODFLOW
MBFR: 2.18
Nuc Rest EF: 58 %
Nuc Stress EF: 64 %
Rest MBF: 0.96 ml/g/min
Rest Nuclear Isotope Dose: 17.6 mCi
ST Depression (mm): 0 mm
Stress MBF: 2.09 ml/g/min
Stress Nuclear Isotope Dose: 17.6 mCi

## 2022-06-12 MED ORDER — RUBIDIUM RB82 GENERATOR (RUBYFILL)
17.6000 | PACK | Freq: Once | INTRAVENOUS | Status: AC
  Administered 2022-06-12: 17.6 via INTRAVENOUS

## 2022-06-12 MED ORDER — REGADENOSON 0.4 MG/5ML IV SOLN
INTRAVENOUS | Status: AC
  Filled 2022-06-12: qty 5

## 2022-06-12 MED ORDER — REGADENOSON 0.4 MG/5ML IV SOLN
0.4000 mg | Freq: Once | INTRAVENOUS | Status: AC
  Administered 2022-06-12: 0.4 mg via INTRAVENOUS

## 2022-06-18 ENCOUNTER — Ambulatory Visit (INDEPENDENT_AMBULATORY_CARE_PROVIDER_SITE_OTHER): Payer: Medicare Other

## 2022-06-18 DIAGNOSIS — I480 Paroxysmal atrial fibrillation: Secondary | ICD-10-CM

## 2022-06-19 LAB — CUP PACEART REMOTE DEVICE CHECK
Date Time Interrogation Session: 20240419230338
Implantable Pulse Generator Implant Date: 20220525

## 2022-06-22 ENCOUNTER — Ambulatory Visit: Payer: Medicare Other | Admitting: Neurology

## 2022-06-22 DIAGNOSIS — M21371 Foot drop, right foot: Secondary | ICD-10-CM
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
## 2022-06-26 ENCOUNTER — Encounter: Payer: Self-pay | Admitting: Neurology

## 2022-06-27 NOTE — Progress Notes (Signed)
[facility] Summary Report / Loop Recorder 

## 2022-06-29 NOTE — Progress Notes (Unsigned)
    SUBJECTIVE:   CHIEF COMPLAINT / HPI:   ***  PERTINENT  PMH / PSH: HFpEF (last ECHO G1DD 12/2021), T2DM with diabetic neuropathy and retinopathy, rheumatoid arthritis, paroxysmal Afib, gout, HTN, h/o CVA, HLD, h/o GI bleed, h/o colon polyp  OBJECTIVE:   There were no vitals taken for this visit.  General: A&O, NAD HEENT: No sign of trauma, EOM grossly intact Cardiac: RRR, no m/r/g Respiratory: CTAB, normal WOB, no w/c/r GI: Soft, NTTP, non-distended  Extremities: NTTP, no peripheral edema. Neuro: Normal gait, moves all four extremities appropriately. Psych: Appropriate mood and affect   ASSESSMENT/PLAN:   There are no diagnoses linked to this encounter.   There are no Patient Instructions on file for this visit.   Billey Co, MD [facility]

## 2022-07-02 ENCOUNTER — Ambulatory Visit: Payer: Medicare Other | Admitting: Pharmacist

## 2022-07-02 ENCOUNTER — Ambulatory Visit (HOSPITAL_COMMUNITY)
Admission: RE | Admit: 2022-07-02 | Discharge: 2022-07-02 | Disposition: A | Discharge status: Home / Self Care | Payer: Medicare Other | Source: Ambulatory Visit | Attending: Family Medicine | Admitting: Family Medicine

## 2022-07-02 ENCOUNTER — Encounter: Payer: Self-pay | Admitting: Family Medicine

## 2022-07-02 ENCOUNTER — Encounter: Payer: Self-pay | Admitting: Pharmacist

## 2022-07-02 ENCOUNTER — Ambulatory Visit: Payer: Medicare Other | Admitting: Family Medicine

## 2022-07-02 VITALS — Wt 156.0 lb

## 2022-07-02 VITALS — BP 110/74 | HR 74 | Ht 65.0 in | Wt 157.0 lb

## 2022-07-02 DIAGNOSIS — E1122 Type 2 diabetes mellitus with diabetic chronic kidney disease: Secondary | ICD-10-CM | POA: Diagnosis not present

## 2022-07-02 DIAGNOSIS — J301 Allergic rhinitis due to pollen: Secondary | ICD-10-CM | POA: Diagnosis not present

## 2022-07-02 DIAGNOSIS — E1140 Type 2 diabetes mellitus with diabetic neuropathy, unspecified: Secondary | ICD-10-CM

## 2022-07-02 DIAGNOSIS — R42 Dizziness and giddiness: Secondary | ICD-10-CM | POA: Insufficient documentation

## 2022-07-02 DIAGNOSIS — I10 Essential (primary) hypertension: Secondary | ICD-10-CM

## 2022-07-02 DIAGNOSIS — I480 Paroxysmal atrial fibrillation: Secondary | ICD-10-CM

## 2022-07-02 DIAGNOSIS — E785 Hyperlipidemia, unspecified: Secondary | ICD-10-CM

## 2022-07-02 DIAGNOSIS — R052 Subacute cough: Secondary | ICD-10-CM

## 2022-07-02 DIAGNOSIS — D500 Iron deficiency anemia secondary to blood loss (chronic): Secondary | ICD-10-CM

## 2022-07-02 DIAGNOSIS — E1169 Type 2 diabetes mellitus with other specified complication: Secondary | ICD-10-CM

## 2022-07-02 LAB — POCT GLYCOSYLATED HEMOGLOBIN (HGB A1C): HbA1c, POC (controlled diabetic range): 6 % (ref 0.0–7.0)

## 2022-07-02 MED ORDER — DICLOFENAC SODIUM 1 % EX GEL: 2.0000 g | Freq: Four times a day (QID) | CUTANEOUS | 1 refills | Status: DC | PRN

## 2022-07-02 MED ORDER — FLUTICASONE PROPIONATE 50 MCG/ACT NA SUSP
2.0000 | Freq: Every day | NASAL | 6 refills | Status: DC
Start: 2022-07-02 — End: 2023-01-01

## 2022-07-02 NOTE — Assessment & Plan Note (Signed)
>>  ASSESSMENT AND PLAN FOR TYPE 2 DIABETES MELLITUS WITH DIABETIC NEUROPATHY, UNSPECIFIED (HCC) WRITTEN ON 07/02/2022  3:50 PM BY AY, MARGARET E, MD  A1c today at goal, congratulated patient Seeing Dr Raymondo Band later for full med review, discussed we could consider titrating down on metformin or decreasing Ozempic in future Denies signs/sx of hypoglycemia

## 2022-07-02 NOTE — Assessment & Plan Note (Signed)
LDL at goal, continue statin. 

## 2022-07-02 NOTE — Assessment & Plan Note (Signed)
Continue home medications, Eliquis Coreg Cardizem

## 2022-07-02 NOTE — Assessment & Plan Note (Signed)
Suspect based on patient history possible overheating/mild dehydration causing spell Reassuringly, VSS and has not happened since, ddx includes cardiac related vs orthostasis/vasovagal EKG ordered and reviewed by myself today- NSR, no ST segment changes or T wave inversions Will get BMP, CBC, TSH to check on chronic problems/potential causes as he has a history of anemia and hypothyroidism

## 2022-07-02 NOTE — Patient Instructions (Addendum)
It was wonderful to see you today.  Please bring ALL of your medications with you to every visit.   Today we talked about:  We checked your Hgb A1c- was 6.0 today, looks great.  For your sinus drainage and congestion- have sent Flonase to your pharmacy. You can do two sprays in each nostril twice daily, and then at night use nasal saline before bed. This should help with your coughing and post-nasal drip.  We are also checking other labwork, I will call you with results. We did an EKG today and your heart rate/rhythm was normal.   Then you have an appointment with Dr Raymondo Band.   Thank you for choosing [facility].   Please call [phone] with any questions about today's appointment.  Please arrive at least 15 minutes prior to your scheduled appointments.   If you had blood work today, I will send you a MyChart message or a letter if results are normal. Otherwise, I will give you a call.   If you had a referral placed, they will call you to set up an appointment. Please give [name] a call if you don't hear back in the next 2 weeks.   If you need additional refills before your next appointment, please call your pharmacy first.   [name], MD  Family Medicine

## 2022-07-02 NOTE — Patient Instructions (Signed)
It was nice to see you today!  Your goal blood sugar is 80-130 before eating and less than 180 after eating.  Medication Changes: Continue all medications as previous.  Monitor blood sugars at home and keep a log (glucometer or piece of paper) to bring with you to your next visit.  Keep up the good work with diet and exercise. Aim for a diet full of vegetables, fruit and lean meats (chicken, [place], fish). Try to limit salt intake by eating fresh or frozen vegetables (instead of canned), rinse canned vegetables prior to cooking and do not add any additional salt to meals.

## 2022-07-02 NOTE — Assessment & Plan Note (Signed)
Getting CBC to check for stability, no signs/symptoms of bleeding Has h/o colon polyps, has appt in June with GI for consideration of repeat colonoscopy

## 2022-07-02 NOTE — Assessment & Plan Note (Signed)
A1c today at goal, congratulated patient Seeing Dr Raymondo Band later for full med review, discussed we could consider titrating down on metformin or decreasing Ozempic in future Denies signs/sx of hypoglycemia

## 2022-07-02 NOTE — Assessment & Plan Note (Signed)
Based on history and exam suspect 2/2 post nasal drip Flonase qAM and nasal saline qPM Call in 2 weeks if not improved, consider further w/u at that time No red flag signs/sx including chest pain, SOB, hemoptysis, dyspnea, fevers, or sputum production

## 2022-07-02 NOTE — Progress Notes (Signed)
S:     Chief Complaint  Patient presents with   Medication Management    Diabetes/HTN   82 y.o. female who presents for diabetes evaluation, education, and management.  PMH is significant for polypharmacy and multiple medical problems.  Patient was referred and last seen by Primary Care Provider, Dr. Miquel Dunn today.  Plans to start with Dr. Linwood Dibbles in near future.  At last visit, with Dr. Leveda Anna in 2.12.2024 minimal changes were made.   Today, patient arrives in good spirits and presents without any assistance.    Current diabetes medications include: Ozempic (semaglutide) 1mg  weekly and metforminXR  2000mg  (4X500) daily Current hypertension medications include: Diltiazem and Carvedilol (25mg  BID) Current hyperlipidemia medications include: Rosuvastatin 10mg   Patient reports adherence to taking all medications as prescribed.  Requested refill of diclofenac gel for should pain.    Do you feel that your medications are working for you? yes Have you been experiencing any side effects to the medications prescribed? no Do you have any problems obtaining medications due to transportation or finances? no  Patient denies hypoglycemic events.  Patient-reported exercise habits: back to normal activities following recent illness.    O:   Review of Systems  Musculoskeletal:  Positive for joint pain (right shoulder).  All other systems reviewed and are negative.   Physical Exam Constitutional:      Appearance: Normal appearance. She is normal weight.  Pulmonary:     Effort: Pulmonary effort is normal.  Neurological:     Mental Status: She is alert.  Psychiatric:        Mood and Affect: Mood normal.        Behavior: Behavior normal.        Thought Content: Thought content normal.        Judgment: Judgment normal.     Lab Results  Component Value Date   HGBA1C 6.0 07/02/2022   Vitals performed at PCP visit - reviewed.   Lipid Panel     Component Value Date/Time    CHOL 109 02/22/2022 1102   TRIG 107 02/22/2022 1102   HDL 61 02/22/2022 1102   CHOLHDL 1.8 02/22/2022 1102   CHOLHDL 4.7 01/27/2015 1238   VLDL 28 01/27/2015 1238   LDLCALC 29 02/22/2022 1102   LDLDIRECT 62 11/24/2018 1211   LDLDIRECT 178 (H) 05/12/2015 1229    Clinical Atherosclerotic Cardiovascular Disease (ASCVD): Yes  The ASCVD Risk score (Arnett DK, et al., 2019) failed to calculate for the following reasons:   The 2019 ASCVD risk score is only valid for ages 59 to 52   A/P Diabetes longstanding currently well controlled with recent A1c of 6. Patient is able to verbalize appropriate hypoglycemia management plan. Medication adherence appears good.  -Continued GLP-1 Ozempic (semaglutide) at 1mg  weekly.  Consider possible dose decrease vs. interval extension in the future to maintain weight and diabetes control.  -Continued metformin 2000mg  daily. -Extensively discussed pathophysiology of diabetes, recommended lifestyle interventions, dietary effects on blood sugar control.   Right shoulder pain  - refilled previously effective diclofenac gel for ongoing shoulder pain.   Chronic acid suppression therapy following history of GI bleed.  Currently taking omeprazole 40mg  daily.  - Future- consider dose reduction to 20mg  once to minimize PPI related side effects including bone density.    Written patient instructions provided. Patient verbalized understanding of treatment plan.  Total time in face to face counseling 23 minutes.    Follow-up:  Pharmacist PRN. PCP clinic visit to meet new  doctor in 1-3 months.  Patient seen with Haze Boyden PharmD Candidate.

## 2022-07-02 NOTE — Assessment & Plan Note (Signed)
Diabetes longstanding currently well controlled with recent A1c of 6. Patient is able to verbalize appropriate hypoglycemia management plan. Medication adherence appears good.  -Continued GLP-1 Ozempic (semaglutide) at 1mg  weekly.  Consider possible dose decrease vs. interval extension in the future to maintain weight and diabetes control.  -Continued metformin 2000mg  daily. -Extensively discussed pathophysiology of diabetes, recommended lifestyle interventions, dietary effects on blood sugar control.

## 2022-07-03 ENCOUNTER — Telehealth: Payer: Self-pay | Admitting: Family Medicine

## 2022-07-03 DIAGNOSIS — D500 Iron deficiency anemia secondary to blood loss (chronic): Secondary | ICD-10-CM

## 2022-07-03 DIAGNOSIS — I10 Essential (primary) hypertension: Secondary | ICD-10-CM

## 2022-07-03 LAB — BASIC METABOLIC PANEL
BUN/Creatinine Ratio: 18 (ref 12–28)
BUN: 22 mg/dL (ref 8–27)
CO2: 21 mmol/L (ref 20–29)
Calcium: 9.8 mg/dL (ref 8.7–10.3)
Chloride: 104 mmol/L (ref 96–106)
Creatinine, Ser: 1.24 mg/dL — ABNORMAL HIGH (ref 0.57–1.00)
Glucose: 126 mg/dL — ABNORMAL HIGH (ref 70–99)
Potassium: 4.3 mmol/L (ref 3.5–5.2)
Sodium: 140 mmol/L (ref 134–144)
eGFR: 43 mL/min/{1.73_m2} — ABNORMAL LOW (ref >59)

## 2022-07-03 LAB — CBC
Hematocrit: 29.6 % — ABNORMAL LOW (ref 34.0–46.6)
Hemoglobin: 9.4 g/dL — ABNORMAL LOW (ref 11.1–15.9)
MCH: 29.6 pg (ref 26.6–33.0)
MCHC: 31.8 g/dL (ref 31.5–35.7)
MCV: 93 fL (ref 79–97)
Platelets: 187 10*3/uL (ref 150–450)
RBC: 3.18 x10E6/uL — ABNORMAL LOW (ref 3.77–5.28)
RDW: 15.6 % — ABNORMAL HIGH (ref 11.7–15.4)
WBC: 4.9 10*3/uL (ref 3.4–10.8)

## 2022-07-03 LAB — T4F: T4,Free (Direct): 1 ng/dL (ref 0.82–1.77)

## 2022-07-03 LAB — TSH RFX ON ABNORMAL TO FREE T4: TSH: 5.04 u[IU]/mL — ABNORMAL HIGH (ref 0.450–4.500)

## 2022-07-03 NOTE — Telephone Encounter (Signed)
Called and verified speaking with Courtney Pena.  Discussed elevated Cr, she denies any decreased urination. Encouraged hydration, reviewed med list and no nephrotoxic meds noted, will repeat BMP later this week to ensure stability and not worsening before her trip. Cr 5 months ago was 0.8.  Discussed low Hgb 9.4, stable from 5 months ago. She denies blood in stool or urine, no melena. Not taking iron every day, just when she remembers it. Will check iron studies with follow up labs.  Hypothyroidism - TSH elevated, takes 112 mcg levothyroxine daily, notes she misses several days per week, she notes she may take it 4/7 days. We discussed taking it more regularly and no dose changes at this time.  She will come to lab tomorrow 07/04/22 for repeat- BMP, ferritin, iron and TIBC.   [name] MD

## 2022-07-04 ENCOUNTER — Other Ambulatory Visit: Payer: Medicare Other

## 2022-07-04 DIAGNOSIS — I10 Essential (primary) hypertension: Secondary | ICD-10-CM

## 2022-07-04 DIAGNOSIS — D500 Iron deficiency anemia secondary to blood loss (chronic): Secondary | ICD-10-CM

## 2022-07-05 LAB — BASIC METABOLIC PANEL
BUN/Creatinine Ratio: 16 (ref 12–28)
BUN: 18 mg/dL (ref 8–27)
CO2: 21 mmol/L (ref 20–29)
Calcium: 9.7 mg/dL (ref 8.7–10.3)
Chloride: 103 mmol/L (ref 96–106)
Creatinine, Ser: 1.11 mg/dL — ABNORMAL HIGH (ref 0.57–1.00)
Glucose: 111 mg/dL — ABNORMAL HIGH (ref 70–99)
Potassium: 4.1 mmol/L (ref 3.5–5.2)
Sodium: 140 mmol/L (ref 134–144)
eGFR: 50 mL/min/{1.73_m2} — ABNORMAL LOW (ref >59)

## 2022-07-05 LAB — IRON AND TIBC
Iron Saturation: 24 % (ref 15–55)
Iron: 71 ug/dL (ref 27–139)
Total Iron Binding Capacity: 300 ug/dL (ref 250–450)
UIBC: 229 ug/dL (ref 118–369)

## 2022-07-05 LAB — FERRITIN: Ferritin: 91 ng/mL (ref 15–150)

## 2022-07-06 ENCOUNTER — Telehealth: Payer: Self-pay | Admitting: Family Medicine

## 2022-07-06 ENCOUNTER — Ambulatory Visit: Payer: Medicare Other | Admitting: Cardiology

## 2022-07-06 DIAGNOSIS — I10 Essential (primary) hypertension: Secondary | ICD-10-CM

## 2022-07-06 NOTE — Telephone Encounter (Signed)
Called and discussed Cr improved. Will recheck in 2 weeks to ensure stability. She has been checking her BP at home and has been 130s/80s.   Discussed lab work not consistent with iron deficiency, she does not take iron regularly and I discussed I don't think we need to do that at this time. Has f/u with GI in June.  Discussed if in future having orthostasis,numbness, weakness, vision changes, vertigo, or other dizziness again please call for appt.  [name] MD

## 2022-07-09 ENCOUNTER — Ambulatory Visit: Payer: Medicare Other

## 2022-07-09 NOTE — Progress Notes (Signed)
Reviewed and agree with Dr Koval's plan.   

## 2022-07-17 ENCOUNTER — Telehealth: Payer: Self-pay

## 2022-07-17 NOTE — Telephone Encounter (Signed)
Gave f/u call regarding med pickup (forgot to document first one 4/46/24).  Left message for patient.   4 boxes of ozempic 1mg  dose pens are ready for pickup.

## 2022-07-19 LAB — CUP PACEART REMOTE DEVICE CHECK
Date Time Interrogation Session: 20240522230149
Implantable Pulse Generator Implant Date: 20220525

## 2022-07-20 ENCOUNTER — Other Ambulatory Visit: Payer: Self-pay

## 2022-07-20 ENCOUNTER — Other Ambulatory Visit: Payer: Self-pay | Admitting: Family Medicine

## 2022-07-20 DIAGNOSIS — I480 Paroxysmal atrial fibrillation: Secondary | ICD-10-CM

## 2022-07-20 DIAGNOSIS — Z7901 Long term (current) use of anticoagulants: Secondary | ICD-10-CM

## 2022-07-20 MED ORDER — DILTIAZEM HCL ER COATED BEADS 360 MG PO CP24: 360.0000 mg | ORAL_CAPSULE | Freq: Every day | ORAL | 3 refills | Status: DC

## 2022-07-24 ENCOUNTER — Ambulatory Visit (INDEPENDENT_AMBULATORY_CARE_PROVIDER_SITE_OTHER): Payer: Medicare Other

## 2022-07-24 DIAGNOSIS — I480 Paroxysmal atrial fibrillation: Secondary | ICD-10-CM

## 2022-07-25 NOTE — Progress Notes (Signed)
[facility] Summary Report / Loop Recorder 

## 2022-08-01 ENCOUNTER — Encounter: Payer: Self-pay | Admitting: Family Medicine

## 2022-08-01 ENCOUNTER — Ambulatory Visit (INDEPENDENT_AMBULATORY_CARE_PROVIDER_SITE_OTHER): Payer: Medicare Other | Admitting: Family Medicine

## 2022-08-01 VITALS — BP 133/81 | HR 87 | Ht 65.0 in | Wt 150.4 lb

## 2022-08-01 DIAGNOSIS — I10 Essential (primary) hypertension: Secondary | ICD-10-CM

## 2022-08-01 DIAGNOSIS — I951 Orthostatic hypotension: Secondary | ICD-10-CM

## 2022-08-01 DIAGNOSIS — I480 Paroxysmal atrial fibrillation: Secondary | ICD-10-CM | POA: Diagnosis not present

## 2022-08-01 DIAGNOSIS — R109 Unspecified abdominal pain: Secondary | ICD-10-CM | POA: Diagnosis not present

## 2022-08-01 MED ORDER — METOPROLOL SUCCINATE ER 50 MG PO TB24
50.0000 mg | ORAL_TABLET | Freq: Two times a day (BID) | ORAL | 1 refills | Status: DC
Start: 2022-08-01 — End: 2022-08-13

## 2022-08-01 NOTE — Patient Instructions (Addendum)
It was wonderful to see you today.  Please bring ALL of your medications with you to every visit.   Today we talked about:  I want you to call back and make an appt with Dr Royanne Foots to follow up on this abdominal wall pseudohernia. [address]  [address]  [place], [place] [address]  Phone: [phone]  We can also refer you to physical therapy to help with this.  We will recheck your kidney function today.  We checked your vitals with changing positions, they showed your blood pressure drops low with position changes. I think we need to decrease your medicines that affect your blood pressure.   Stop the carvedilol Restart metoprolol 50mg  BID  Check your heart rate and give me a call if it is consistently > 100.   Schedule a 1 week follow up   Thank you for choosing [facility].   Please call [phone] with any questions about today's appointment.  Please arrive at least 15 minutes prior to your scheduled appointments.   If you had blood work today, I will send you a MyChart message or a letter if results are normal. Otherwise, I will give you a call.   If you had a referral placed, they will call you to set up an appointment. Please give [name] a call if you don't hear back in the next 2 weeks.   If you need additional refills before your next appointment, please call your pharmacy first.   [name], MD  Family Medicine

## 2022-08-01 NOTE — Progress Notes (Unsigned)
    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 She notes it is present daily and feels like a tightening and has been going on for past year. From right side to naval down to her pelvis. Surgeon Dr Royanne Foots told her it would get better with time but present for entire year. No fevers, chills, nausea, vomiting, has daily soft BM, no diarrhea constipation or blood in stool. No dysuria or blood in urine. Not worse with eating or other trigger.   Dizziness with position changes- for the past week or two she notes when she goes from laying to sitting she has dizziness that lasts for a few seconds and then goes away. NO falls, NO shortness of breath or chest pain or palpitations.   PERTINENT  PMH / PSH: HFpEF (last ECHO G1DD 12/2021), T2DM with diabetic neuropathy and retinopathy, rheumatoid arthritis, paroxysmal Afib, gout, HTN, h/o CVA, HLD, h/o GI bleed, h/o colon polyp, hypothyroidism   OBJECTIVE:   BP 133/81   Pulse 87   Ht 5\' 5"  (1.651 m)   Wt 150 lb 6.4 oz (68.2 kg)   SpO2 100%   BMI 25.03 kg/m   General: A&O, NAD HEENT: No sign of trauma, EOM grossly intact Cardiac: RRR, no m/r/g Respiratory: CTAB, normal WOB, no w/c/r GI: Soft, mild TTP in RLQ and RUQ, + bowel sounds, negative Murphys, no guarding or rebound, no abdominal wall defect palpated, previously healed scar of zoster infection noted, non-distended  Extremities: NTTP, no peripheral edema. Neuro: Normal gait, moves all four extremities appropriately. Psych: Appropriate mood and affect  Orthostatic vitals positive-  BP 131/73 pulse 76 laying BP 103/73 with pulse 84 sitting BP 90/58s pulse 91 with standing. ASSESSMENT/PLAN:   Orthostatic hypotension Orthostatic vitals positive today and elliciting patient symptoms We determined she was previously on metoprolol and switched to carvedilol when she needed better BP control in past Will stop  carvedilol, switch back to metoprolol but lower dose 50mg  BID (was previously on 100mg  BID) She will check pulse at home and call if having palpitations or HR > 100 consistently  Continue diltiazem, could also consider decreasing this dose in future if needed Fall precautions discussed Repeat BMP today Cr slightly elevated, repeat in 1 week at f/u visit wonder if related to hypotension  Abdominal wall pain Chronic over past year, Suspect due to chronicity and timing related to zoster complication pseudohernia Discussed PT and supportive care vs trial of medications, she would prefer to discuss with her previous general surgeon, given number to call Discussed I am happy to place PT referral at any time, could also trial low dose gapapentin if suspect related to neuropathic pain Benign abd exam without peritoneal signs and no red flag symptoms per history Abd CT 01/2022 reviewed that was done when she was having these symptoms with no hepatobiliary dilation, do not feel further imaging warranted at this time     Billey Co, MD [facility]

## 2022-08-02 DIAGNOSIS — R109 Unspecified abdominal pain: Secondary | ICD-10-CM | POA: Insufficient documentation

## 2022-08-02 DIAGNOSIS — I951 Orthostatic hypotension: Secondary | ICD-10-CM | POA: Insufficient documentation

## 2022-08-02 LAB — BASIC METABOLIC PANEL
BUN/Creatinine Ratio: 18 (ref 12–28)
BUN: 23 mg/dL (ref 8–27)
CO2: 25 mmol/L (ref 20–29)
Calcium: 9.7 mg/dL (ref 8.7–10.3)
Chloride: 103 mmol/L (ref 96–106)
Creatinine, Ser: 1.25 mg/dL — ABNORMAL HIGH (ref 0.57–1.00)
Glucose: 147 mg/dL — ABNORMAL HIGH (ref 70–99)
Potassium: 4.5 mmol/L (ref 3.5–5.2)
Sodium: 142 mmol/L (ref 134–144)
eGFR: 43 mL/min/{1.73_m2} — ABNORMAL LOW (ref >59)

## 2022-08-02 NOTE — Assessment & Plan Note (Signed)
Orthostatic vitals positive today and elliciting patient symptoms We determined she was previously on metoprolol and switched to carvedilol when she needed better BP control in past Will stop carvedilol, switch back to metoprolol but lower dose 50mg  BID (was previously on 100mg  BID) She will check pulse at home and call if having palpitations or HR > 100 consistently  Continue diltiazem, could also consider decreasing this dose in future if needed Fall precautions discussed Repeat BMP today Cr slightly elevated, repeat in 1 week at f/u visit wonder if related to hypotension

## 2022-08-02 NOTE — Assessment & Plan Note (Addendum)
Chronic over past year, Suspect due to chronicity and timing related to zoster complication pseudohernia Discussed PT and supportive care vs trial of medications, she would prefer to discuss with her previous general surgeon, given number to call Discussed I am happy to place PT referral at any time, could also trial low dose gapapentin if suspect related to neuropathic pain Benign abd exam without peritoneal signs and no red flag symptoms per history Abd CT 01/2022 reviewed that was done when she was having these symptoms with no hepatobiliary dilation, do not feel further imaging warranted at this time

## 2022-08-07 NOTE — Progress Notes (Signed)
Courtney Pena    [id number]    August 31, 1940  Primary Care Physician:Pray, Milus Mallick, MD  Referring Physician: Moses Manners, MD No address on file   Chief complaint:  R side abdominal pain Chief Complaint  Patient presents with   Abdominal Pain    Pt states she has pain in her LRQ. Pt states it feels like a tight knot.    HPI: 82 year old very pleasant African-American female here for follow-up visit for persistent right-sided abdominal pain.    I last saw her on 05-02-21. At that time, she complained of RUQ pain. She also lost significant weight since starting Ozempic, her A1c has improved to below 6.5 from 9 2 years ago  Today, she complains of RLQ pain that occasionally radiates to the right upper quadrant and epigastric region. She states the pain feels like a tight knot. We also discussed her previous diagnosis of shingles and how it's effecting her current pain. We also reviewed her latest cholecystectomy and imaging.  she denies diarrhea, constipation, nausea, blood in stool, black stool, vomiting, bloating, unintentional weight loss, reflux, dysphagia.  GI Hx:  CT Abdomen Pelvis wo contrast 02-01-22 1. No acute findings or clear explanation for the patient's symptoms. 2. Moderate to large amount of stool in the distal colon, especially the rectum, suggesting constipation. 3. Stable cardiomegaly, small pericardial effusion and small pleural effusions. Mildly increased generalized subcutaneous edema. 4. Grossly unchanged renal cystic lesions of varying complexity. 5.  Aortic Atherosclerosis (ICD10-I70.0).  DG Abd portable 1V 01-11-22 Nonobstructive bowel gas pattern. Small amount of stool in the colon.   HIDA scan 04/03/21 Little to no subjective emptying of tracer from gallbladder following fatty meal stimulation.   Calculated gallbladder ejection fraction is -2%, gallbladder continued to fill not empty following Ensure.   MRI abdomen  02/10/21 1. Probable small gallstones layering in the gallbladder given the somewhat granular appearance on the T2 weighted images. No biliary dilatation. No focal hepatic lesion is identified. 2. 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 The patient has height adjusted total kidney volume places the patient in [facility] class 1A (low risk of renal progression related to the polycystic kidney disease). Based on patient history, the patient does have known chronic diabetic nephropathy. 3.  Aortic Atherosclerosis (ICD10-I70.0). 4. Thoracolumbar spondylosis and degenerative disc disease. 5. 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   Colonoscopy March 26, 2018 - Three 5 to 8 mm polyps in the transverse colon and in the ascending colon, removed with a cold snare. Resected and retrieved. - Diverticulosis in the sigmoid colon and in the descending colon. - Non-bleeding internal hemorrhoids.   Surgical [P], transverse and ascending; polyp (3) - TUBULAR ADENOMA (X2 FRAGMENTS). - SESSILE SERRATED POLYP WITHOUT DYSPLASIA. - NO HIGH GRADE DYSPLASIA OR MALIGNANCY.   EGD March 26, 2018: Normal   Capsule endoscopy for iron deficiency anemia 04/15/2018 unremarkable   EGD May 2017 was normal exam with no evidence of GAVE.     Colonoscopy April 2012 showed sigmoid diverticulosis and had removal of diminutive polyp but was not adenoma but benign colonic mucosa based on pathology report.     HIDA scan 04/10/2017 showed decreased gallbladder EF 20% suggestive of biliary dyskinesia but no other abnormality.   Current Outpatient Medications:    acetaminophen (TYLENOL) 650 MG CR tablet, Take 650 mg by mouth every 8 (eight) hours as needed for pain., Disp: , Rfl:    allopurinol (ZYLOPRIM)  100 MG tablet, Take 1 tablet (100 mg total) by mouth daily., Disp: 90 tablet, Rfl: 0   Apoaequorin (PREVAGEN PO), Take 1 tablet by mouth daily., Disp: , Rfl:    ascorbic acid (VITAMIN C)  1000 MG tablet, Take 1 tablet (1,000 mg total) by mouth daily., Disp: 120 tablet, Rfl: 0   blood glucose meter kit and supplies, Dispense based on patient and insurance preference. Use up to four times daily as directed. (FOR ICD-10 E10.9, E11.9)., Disp: 1 each, Rfl: 0   Cholecalciferol (VITAMIN D3) 2000 units TABS, Take 4,000 Units by mouth daily., Disp: , Rfl:    Coenzyme Q10 (COQ10) 100 MG CAPS, Take 100 mg by mouth daily., Disp: , Rfl:    colchicine 0.6 MG tablet, Take 1 tablet (0.6 mg total) by mouth as needed., Disp: 30 tablet, Rfl: 6   cyanocobalamin (VITAMIN B12) 1000 MCG tablet, Take 1 tablet (1,000 mcg total) by mouth daily., Disp: 30 tablet, Rfl: 0   diclofenac Sodium (VOLTAREN) 1 % GEL, Apply 2 g topically 4 (four) times daily as needed., Disp: 60 g, Rfl: 1   diltiazem (CARDIZEM CD) 360 MG 24 hr capsule, Take 1 capsule (360 mg total) by mouth daily., Disp: 90 capsule, Rfl: 3   diltiazem (CARDIZEM) 30 MG tablet, One tab by mouth every six hours as needed for atrial fib, Disp: 30 tablet, Rfl: 6   ELIQUIS 5 MG TABS tablet, TAKE 1 TABLET BY MOUTH TWICE  DAILY, Disp: 180 tablet, Rfl: 3   ferrous sulfate 325 (65 FE) MG EC tablet, Take 650 mg by mouth daily. , Disp: , Rfl:    fluticasone (FLONASE) 50 MCG/ACT nasal spray, Place 2 sprays into both nostrils daily., Disp: 16 g, Rfl: 6   glucose blood (ONETOUCH ULTRA) test strip, 1 Container by Other route every morning., Disp: , Rfl:    Insulin Pen Needle 31G X 8 MM MISC, Use with pen to inject once daily., Disp: 100 each, Rfl: 3   Lancets (ONETOUCH ULTRASOFT) lancets, Use to check blood sugar daily., Disp: 100 each, Rfl: 3   levothyroxine (SYNTHROID) 112 MCG tablet, TAKE 1 TABLET BY MOUTH DAILY  BEFORE BREAKFAST, Disp: 90 tablet, Rfl: 3   meclizine (ANTIVERT) 25 MG tablet, TAKE 1 TABLET (25 MG TOTAL) BY MOUTH AS NEEDED., Disp: 30 tablet, Rfl: 3   metFORMIN (GLUCOPHAGE-XR) 500 MG 24 hr tablet, TAKE 4 TABLETS BY MOUTH  DAILY, Disp: 360 tablet, Rfl:  3   metoprolol succinate (TOPROL-XL) 50 MG 24 hr tablet, Take 1 tablet (50 mg total) by mouth in the morning and at bedtime. Take with or immediately following a meal., Disp: 60 tablet, Rfl: 1   Multiple Vitamin (MULTI-VITAMINS) TABS, Take 1 tablet by mouth daily., Disp: , Rfl:    nitroGLYCERIN (NITROSTAT) 0.4 MG SL tablet, Place 1 tablet (0.4 mg total) under the tongue every 5 (five) minutes as needed for chest pain (Call 911 if chest pain after three doses)., Disp: 25 tablet, Rfl: 3   omeprazole (PRILOSEC) 40 MG capsule, TAKE 1 CAPSULE BY MOUTH DAILY, Disp: 90 capsule, Rfl: 3   ONETOUCH ULTRA test strip, CHECK BLOOD SUGAR 3 TIMES  DAILY, Disp: 300 strip, Rfl: 3   rosuvastatin (CRESTOR) 10 MG tablet, TAKE 1 TABLET BY MOUTH DAILY, Disp: 90 tablet, Rfl: 3   Semaglutide, 1 MG/DOSE, 2 MG/1.5ML SOPN, Inject 1 mg into the skin once a week., Disp: , Rfl:     Allergies as of 08/15/2022 - Review Complete 08/13/2022  Allergen Reaction Noted   Actos [pioglitazone] Swelling 01/07/2006   Lisinopril Swelling 01/07/2006   Nsaids Other (See Comments) 08/12/2011   Penicillins Shortness Of Breath and Swelling 12/26/2010   Sulfamethoxazole Hives and Itching 01/07/2006   Flecainide Other (See Comments) 10/13/2015   Hctz [hydrochlorothiazide] Other (See Comments) 02/04/2019   Iodinated contrast media Itching 12/29/2015   Prednisone Other (See Comments) 10/13/2021   Statins Other (See Comments) 01/31/2017   Gabapentin Other (See Comments) 06/23/2015   Losartan potassium Swelling 10/13/2015   Zetia [ezetimibe] Other (See Comments) 09/19/2017    Past Medical History:  Diagnosis Date   Allergy    Arthritis    Atrial flutter (HCC) 12/07/2010   converted in ED with 300 mg flecainide   CAD (coronary artery disease)    a. mild per cath in 2004;  b. nonischemic Myoview in March 2012;  c. Lex MV 1/14:  EF 66%, no ischemia   Cataract    Dysrhythmia    A-Fib. has a loop recorder   External hemorrhoids  06/07/2010   Family history of adverse reaction to anesthesia    Daughter has severe N&V   Gastric antral vascular ectasia    source for gi bleed in 07/2011 - Xarelto stopped   GERD (gastroesophageal reflux disease)    Gout    Hyperlipidemia    Hypertension    Hypothyroidism    Neuromuscular disorder (HCC)    neuropathy in feet   Obesity    Personal history of colonic polyps 06/06/2009   cecal polyp   Rheumatoid arthritis (HCC)    Shingles 2023   Sleep apnea    lost weight no longer needs   Type 2 diabetes mellitus with diabetic chronic kidney disease (HCC) 04/25/2006        Past Surgical History:  Procedure Laterality Date   ARTERY BIOPSY N/A 08/31/2021   Procedure: LEFT TEMPORAL ARTERY BIOPSY;  Surgeon: Abigail Miyamoto, MD;  Location: [facility];  Service: General;  Laterality: N/A;   BREAST BIOPSY Left    BREAST EXCISIONAL BIOPSY Left    benign   CARDIAC CATHETERIZATION  1999&2004   CARPAL TUNNEL RELEASE Bilateral 2003   CATARACT EXTRACTION Bilateral 1990   CHOLECYSTECTOMY N/A 07/05/2021   Procedure: LAPAROSCOPIC CHOLECYSTECTOMY;  Surgeon: Quentin Ore, MD;  Location: [facility];  Service: General;  Laterality: N/A;   COLONOSCOPY     ESOPHAGOGASTRODUODENOSCOPY  08/13/2011   Procedure: ESOPHAGOGASTRODUODENOSCOPY (EGD);  Surgeon: Iva Boop, MD;  Location: [facility] ENDOSCOPY;  Service: Endoscopy;  Laterality: N/A;   implantable loop recorder placement  07/20/2020   Medtronic Reveal Linq model LNQ 22 ([place] [id number]) implantable loop recorder by Dr Johney Frame    Family History  Problem Relation Age of Onset   Heart disease Father    Diabetes Maternal Grandfather    Hypertension Mother    Other Mother        brain tumor-benign   Diabetes Daughter        pre-diabeties   Colon polyps Daughter    Thyroid disease Daughter        x 2   Colon cancer Neg Hx    Esophageal cancer Neg Hx    Stomach cancer Neg Hx    Rectal cancer Neg Hx     Social History   Socioeconomic  History   Marital status: Widowed    Spouse name: Greggory Stallion   Number of children: 4   Years of education: some colle   Highest education level: Not on file  Occupational History   Occupation: Administrator, Civil Service: UNEMPLOYED  Tobacco Use   Smoking status: Former    Packs/day: 2.00    Years: 30.00    Additional pack years: 0.00    Total pack years: 60.00    Types: Cigarettes    Quit date: 02/26/1993    Years since quitting: 29.4   Smokeless tobacco: Never   Tobacco comments:    Former smoker 03/21/22  Vaping Use   Vaping Use: Never used  Substance and Sexual Activity   Alcohol use: No    Alcohol/week: 0.0 standard drinks of alcohol   Drug use: No   Sexual activity: Not Currently  Other Topics Concern   Not on file  Social History Narrative   Not on file   Social Determinants of Health   Financial Resource Strain: Not on file  Food Insecurity: No Food Insecurity (01/05/2022)   Hunger Vital Sign    Worried About Running Out of Food in the Last Year: Never true    Ran Out of Food in the Last Year: Never true  Transportation Needs: No Transportation Needs (01/05/2022)   PRAPARE - Administrator, Civil Service (Medical): No    Lack of Transportation (Non-Medical): No  Physical Activity: Not on file  Stress: Not on file  Social Connections: Not on file  Intimate Partner Violence: Not At Risk (01/05/2022)   Humiliation, Afraid, Rape, and Kick questionnaire    Fear of Current or Ex-Partner: No    Emotionally Abused: No    Physically Abused: No    Sexually Abused: No      Review of systems: Review of Systems  Constitutional:  Negative for unexpected weight change.  HENT:  Negative for trouble swallowing.   Gastrointestinal:  Positive for abdominal pain. Negative for abdominal distention, anal bleeding, blood in stool, constipation, diarrhea, nausea, rectal pain and vomiting.      Physical Exam: There were no vitals filed for this visit.  Body mass  index is 25.13 kg/m. General: well-appearing   Eyes: sclera anicteric, no redness ENT: oral mucosa moist without lesions, no cervical or supraclavicular lymphadenopathy CV: RRR, no JVD, no peripheral edema Resp: clear to auscultation bilaterally, normal RR and effort noted GI: soft, no tenderness, with active bowel sounds. No guarding or palpable organomegaly noted. Skin; warm and dry, no rash or jaundice noted Neuro: awake, alert and oriented x 3. Normal gross motor function and fluent speech   Data Reviewed:  Reviewed labs, radiology imaging, old records and pertinent past GI work up   Assessment and Plan/Recommendations:  82 year-old very pleasant female with history of chronic iron deficiency anemia, type 2 diabetes, hypothyroidism, thoracolumbar degenerative disc disease with radiculopathy with cholelithiasis and cholecystitis s/p cholecystectomy with hernia repair with right-sided abdominal pain   Right side abdominal pain secondary to postop changes and possible adhesions  Continue to monitor May benefit from physical therapy, will need to exclude musculoskeletal etiology   History of colon polyps: No recall colonoscopy due to age  Chronic idiopathic constipation: Increase dietary fiber and water intake Return as needed   The patient was provided an opportunity to ask questions and all were answered. The patient agreed with the plan and demonstrated an understanding of the instructions.  Iona Beard , MD    CC: Moses Manners, MD  Ladona Mow Hewitt Shorts as a scribe for Marsa Aris, MD.,have documented all relevant documentation on the behalf of Marsa Aris, MD,as directed by  Marsa Aris, MD while in the presence of Marsa Aris, MD.   I, Marsa Aris, MD, have reviewed all documentation for this visit. The documentation on 08/15/22 for the exam, diagnosis, procedures, and orders are all accurate and complete.

## 2022-08-10 ENCOUNTER — Ambulatory Visit
Admission: RE | Admit: 2022-08-10 | Discharge: 2022-08-10 | Disposition: A | Discharge status: Home / Self Care | Payer: Medicare Other | Source: Ambulatory Visit | Attending: Family Medicine | Admitting: Family Medicine

## 2022-08-10 DIAGNOSIS — Z1231 Encounter for screening mammogram for malignant neoplasm of breast: Secondary | ICD-10-CM

## 2022-08-13 ENCOUNTER — Encounter: Payer: Self-pay | Admitting: Family Medicine

## 2022-08-13 ENCOUNTER — Ambulatory Visit: Payer: Medicare Other | Admitting: Family Medicine

## 2022-08-13 VITALS — BP 126/74 | HR 73 | Ht 65.0 in | Wt 154.4 lb

## 2022-08-13 DIAGNOSIS — M75101 Unspecified rotator cuff tear or rupture of right shoulder, not specified as traumatic: Secondary | ICD-10-CM | POA: Diagnosis not present

## 2022-08-13 DIAGNOSIS — I951 Orthostatic hypotension: Secondary | ICD-10-CM

## 2022-08-13 DIAGNOSIS — I480 Paroxysmal atrial fibrillation: Secondary | ICD-10-CM

## 2022-08-13 MED ORDER — METOPROLOL SUCCINATE ER 50 MG PO TB24: 50.0000 mg | ORAL_TABLET | Freq: Two times a day (BID) | ORAL | 1 refills | Status: DC

## 2022-08-13 MED ORDER — DICLOFENAC SODIUM 1 % EX GEL: 2.0000 g | Freq: Four times a day (QID) | CUTANEOUS | 1 refills | Status: DC | PRN

## 2022-08-13 MED ORDER — ALLOPURINOL 100 MG PO TABS: 100.0000 mg | ORAL_TABLET | Freq: Every day | ORAL | 0 refills | Status: DC

## 2022-08-13 NOTE — Patient Instructions (Addendum)
It was great seeing you today!  I am glad your dizziness has improved! You can take Metoprolol 50mg  twice a day. If your dizziness worsens then go back to once a day and let us know.  We are also checking your kidney function again, we will call you if anything is abnormal.  Placed referral for physical therapy, someone will call you in the next couple of weeks with the details and to schedule the appointments.  I have also sent in for the medications we discussed to your mail order pharmacy.  Feel free to call with any questions or concerns at any time, at [phone].   Take care,  Dr. Cora Collum [facility]

## 2022-08-13 NOTE — Progress Notes (Unsigned)
    SUBJECTIVE:   CHIEF COMPLAINT / HPI:   Patient presents for folllow up on orthostatic hypotension. Was seen by PCP 2 weeks ago and orthostatics were positive. Coreg was discontinued and she was started on Metoprolol 50mg  daily (not BID) but today asks if she should take it twice daily. States dizziness has improved and she hasn't had an episode since her last visit.  When discussing her last labs mentioned about hydration status and she does state she hasn't been hydrating well and will work on that.   States rhematologist said arthritis is bad particularly on right side and recommends against further injections.  Rheumatologist gave Rx for PT but she states she lost it, requests another Rx. Prefers to come to [facility] for therapy   Requests refill of Diclofenac,  Metoprolol, and Allopurinol to mail order in pharmacy   PERTINENT  PMH / PSH: Reviewed   OBJECTIVE:   BP 126/74   Pulse 73   Ht 5\' 5"  (1.651 m)   Wt 154 lb 6 oz (70 kg)   SpO2 99%   BMI 25.69 kg/m    Physical exam General: well appearing, NAD Cardiovascular: RRR, no murmurs Lungs: CTAB. Normal WOB Abdomen: soft, non-distended Skin: warm, dry. No edema  ASSESSMENT/PLAN:   Orthostatic hypotension Improved since last visit 2 weeks ago when Coreg was discontinued and she was started on metoprolol 50mg  BID though she was only taking it daily. Advised that she could take it twice a day given her vitals can support that, but if she was to become symptomatic again to go back down to taking it daily. Will recheck kidney function since it was elevated at last check. Also discussed staying well hydrated which should help with orthostasis and possibly her kidney function   Arthritis Placed PT referral per patient's request   Cora Collum, DO [facility]

## 2022-08-14 ENCOUNTER — Other Ambulatory Visit: Payer: Self-pay

## 2022-08-14 LAB — BASIC METABOLIC PANEL
BUN/Creatinine Ratio: 17 (ref 12–28)
BUN: 26 mg/dL (ref 8–27)
CO2: 23 mmol/L (ref 20–29)
Calcium: 9.8 mg/dL (ref 8.7–10.3)
Chloride: 102 mmol/L (ref 96–106)
Creatinine, Ser: 1.5 mg/dL — ABNORMAL HIGH (ref 0.57–1.00)
Glucose: 122 mg/dL — ABNORMAL HIGH (ref 70–99)
Potassium: 5.1 mmol/L (ref 3.5–5.2)
Sodium: 140 mmol/L (ref 134–144)
eGFR: 35 mL/min/{1.73_m2} — ABNORMAL LOW (ref >59)

## 2022-08-14 NOTE — Assessment & Plan Note (Signed)
Improved since last visit 2 weeks ago when Coreg was discontinued and she was started on metoprolol 50mg  BID though she was only taking it daily. Advised that she could take it twice a day given her vitals can support that, but if she was to become symptomatic again to go back down to taking it daily. Will recheck kidney function since it was elevated at last check. Also discussed staying well hydrated which should help with orthostasis and possibly her kidney function

## 2022-08-15 ENCOUNTER — Encounter: Payer: Self-pay | Admitting: Gastroenterology

## 2022-08-15 ENCOUNTER — Ambulatory Visit: Payer: Medicare Other | Admitting: Gastroenterology

## 2022-08-15 VITALS — BP 130/68 | HR 73 | Ht 65.0 in | Wt 151.0 lb

## 2022-08-15 DIAGNOSIS — R1011 Right upper quadrant pain: Secondary | ICD-10-CM

## 2022-08-15 NOTE — Patient Instructions (Addendum)
Follow up as needed   Continue High Fiber diet  Increase water intake   _______________________________________________________  If your blood pressure at your visit was 140/90 or greater, please contact your primary care physician to follow up on this.  _______________________________________________________  If you are age 82 or older, your body mass index should be between 23-30. Your Body mass index is 25.13 kg/m. If this is out of the aforementioned range listed, please consider follow up with your Primary Care Provider.  If you are age 54 or younger, your body mass index should be between 19-25. Your Body mass index is 25.13 kg/m. If this is out of the aformentioned range listed, please consider follow up with your Primary Care Provider.   ________________________________________________________  The [facility] providers would like to encourage you to use [facility] to communicate with providers for non-urgent requests or questions.  Due to long hold times on the telephone, sending your provider a message by [facility] may be a faster and more efficient way to get a response.  Please allow 48 business hours for a response.  Please remember that this is for non-urgent requests.  _______________________________________________________   I appreciate the  opportunity to care for you  Thank You   Marsa Aris , MD

## 2022-08-16 ENCOUNTER — Other Ambulatory Visit (INDEPENDENT_AMBULATORY_CARE_PROVIDER_SITE_OTHER): Payer: Medicare Other | Admitting: Family Medicine

## 2022-08-16 DIAGNOSIS — N179 Acute kidney failure, unspecified: Secondary | ICD-10-CM | POA: Diagnosis not present

## 2022-08-16 MED ORDER — METFORMIN HCL ER 500 MG PO TB24: 500.0000 mg | ORAL_TABLET | Freq: Two times a day (BID) | ORAL | 1 refills | Status: DC

## 2022-08-16 NOTE — Progress Notes (Signed)
Office Visit    Patient Name: Courtney Pena Date of Encounter: 08/16/2022  PCP:  Billey Co, MD   [facility]  Cardiologist:  Charlton Haws, MD  Electrophysiologist: Allred/Camnitz  Chief Complaint    Courtney Pena is a 82 y.o. female with a hx of DM, hypertension, hyperlipidemia, obesity, aflutter (off xarelto 2013 secondary to UGIB now on Eliquis), CHADS2VASC of 5 Last seen in May to clear for Laparoscopic cholecystectomy which she had with Dr Dossie Der uneventfully on 07/05/21   She has ILR in place with low PAF burden. She has been intolerant to flecainide with visual changes  She had is on Eliquis and cardizem She had some bleeding issues with xarelto in past. She continues to have some PAF once during coloscopy and once with steroid injection They can last 3-4 hours then she converts No CAD with normal myovue EF 66% 05/10/20 TTE 12/18/19 EF 65-70% mild LAE 4.2 cm no valve dx.    Has had left sided headaches Had left temporal artery biopsy with Dr Magnus Ivan on 08/31/21 Rx with steroids but biopsy negative Her BS;s elevated on steroids. Neuropathy not helped by Cymbalta and intolerant to gabapentin   Seen by EP Dr Elberta Fortis 09/18/21 Linq monitor showed last episode of flutter/fib was December 2022 Burden 0% although she thinks she feels some episodes Linq changed to pick up episodes 150 bpm or higher Offered ablation or Rythmol and deferred   Normal PET/CT 06/12/22 EF 45% normal MBFR 2.18  PACEART 07/19/22 no PAF burden Had more swelling with losartan and D/C   Has good support system with 4 kids nearby and son across the street   Past Medical History    Past Medical History:  Diagnosis Date   Allergy    Arthritis    Atrial flutter (HCC) 12/07/2010   converted in ED with 300 mg flecainide   CAD (coronary artery disease)    a. mild per cath in 2004;  b. nonischemic Myoview in March 2012;  c. Lex MV 1/14:  EF 66%, no ischemia    Cataract    Dysrhythmia    A-Fib. has a loop recorder   External hemorrhoids 06/07/2010   Family history of adverse reaction to anesthesia    Daughter has severe N&V   Gastric antral vascular ectasia    source for gi bleed in 07/2011 - Xarelto stopped   GERD (gastroesophageal reflux disease)    Gout    Hyperlipidemia    Hypertension    Hypothyroidism    Neuromuscular disorder (HCC)    neuropathy in feet   Obesity    Personal history of colonic polyps 06/06/2009   cecal polyp   Rheumatoid arthritis (HCC)    Shingles 2023   Sleep apnea    lost weight no longer needs   Type 2 diabetes mellitus with diabetic chronic kidney disease (HCC) 04/25/2006       Past Surgical History:  Procedure Laterality Date   ARTERY BIOPSY N/A 08/31/2021   Procedure: LEFT TEMPORAL ARTERY BIOPSY;  Surgeon: Abigail Miyamoto, MD;  Location: [facility];  Service: General;  Laterality: N/A;   BREAST BIOPSY Left    BREAST EXCISIONAL BIOPSY Left    benign   CARDIAC CATHETERIZATION  1999&2004   CARPAL TUNNEL RELEASE Bilateral 2003   CATARACT EXTRACTION Bilateral 1990   CHOLECYSTECTOMY N/A 07/05/2021   Procedure: LAPAROSCOPIC CHOLECYSTECTOMY;  Surgeon: Quentin Ore, MD;  Location: [facility];  Service: General;  Laterality: N/A;  COLONOSCOPY     ESOPHAGOGASTRODUODENOSCOPY  08/13/2011   Procedure: ESOPHAGOGASTRODUODENOSCOPY (EGD);  Surgeon: Iva Boop, MD;  Location: [facility] END[facility]COPY;  Service: Endoscopy;  Laterality: N/A;   implantable loop recorder placement  07/20/2020   Medtronic Reveal Linq model LNQ 22 ([place] [id number]) implantable loop recorder by Dr Johney Frame    Allergies  Allergies  Allergen Reactions   Actos [Pioglitazone] Swelling    Swelling all over body and moonface   Lisinopril Swelling    Swelling, may have had some breathing involvement. Angioedema   Nsaids Other (See Comments)    Patient reports internal bleeding. Experienced with tolmetin.    Penicillins Shortness Of Breath and Swelling     Arm Swelling with Penicillin (Occurred in 1960s) Breathing - throat swelling with Amoxicillin (Occurred prior to 2002)   Sulfamethoxazole Hives and Itching    "welps all over" immediately after dose   Flecainide Other (See Comments)    Blurry  vision   Hctz [Hydrochlorothiazide] Other (See Comments)    Gout   Iodinated Contrast Media Itching    Pt. Developed mild itching after receiving IV cm; pt. Held; Dr. Sheldon Silvan recomended she take 50 mg of benadryl when she goes home-if necessary; Dr Marene Lenz recommends benadryl prior to future exams requiring contrast media, but stated other doctors may recommend another premedication prep.   Prednisone Other (See Comments)    Agitation, vivid dreams, anxiety does not want to take again.    Statins Other (See Comments)    Muscle aches with multiple statins. Able to tolerate rosuvastatin.   Gabapentin Other (See Comments)    Caused dysphoria    Losartan Potassium Swelling    Lower extremity swelling   Zetia [Ezetimibe] Other (See Comments)    Cramps      EKGs/Labs/Other Studies Reviewed:   The following studies were reviewed today:  Lexiscan Myoview 05/10/2020  Nuclear stress EF: 66%. The left ventricular ejection fraction is hyperdynamic (>65%). There was no ST segment deviation noted during stress. The study is normal. This is a low risk study.   No ischemia or infarction on perfusion images. Mild perfusion defect at segment 17 apical cap - likely attenuation artifact. Normal wall motion.   Echocardiogram 12/18/19  IMPRESSIONS     1. Left ventricular ejection fraction, by estimation, is 65 to 70%. The  left ventricle has normal function. The left ventricle has no regional  wall motion abnormalities. There is mild left ventricular hypertrophy.  Left ventricular diastolic parameters  are indeterminate. Elevated left atrial pressure.   2. Right ventricular systolic function is normal. The right ventricular  size is normal.    3. A small pericardial effusion is present. There is no evidence of  cardiac tamponade.   4. The mitral valve is degenerative. Trivial mitral valve regurgitation.  No evidence of mitral stenosis.   5. The aortic valve has an indeterminant number of cusps. Aortic valve  regurgitation is not visualized. No aortic stenosis is present.   6. The inferior vena cava is normal in size with greater than 50%  respiratory variability, suggesting right atrial pressure of 3 mmHg.   EKG:  EKG is  ordered today.  The ekg ordered today demonstrates NSR, rate 64 bpm  Recent Labs: 01/10/2022: Magnesium 1.9 02/01/2022: ALT 11 07/02/2022: Hemoglobin 9.4; Platelets 187; TSH 5.040 08/13/2022: BUN 26; Creatinine, Ser 1.50; Potassium 5.1; Sodium 140  Recent Lipid Panel    Component Value Date/Time   CHOL 109 02/22/2022 1102   TRIG 107 02/22/2022 1102  HDL 61 02/22/2022 1102   CHOLHDL 1.8 02/22/2022 1102   CHOLHDL 4.7 01/27/2015 1238   VLDL 28 01/27/2015 1238   LDLCALC 29 02/22/2022 1102   LDLDIRECT 62 11/24/2018 1211   LDLDIRECT 178 (H) 05/12/2015 1229    Risk Assessment/Calculations:   CHA2DS2-VASc Score = 6   This indicates a 9.7% annual risk of stroke. The patient's score is based upon: CHF History: 0 HTN History: 1 Diabetes History: 1 Stroke History: 0 Vascular Disease History: 1 Age Score: 2 Gender Score: 1    Home Medications   No outpatient medications have been marked as taking for the 08/27/22 encounter (Appointment) with Wendall Stade, MD.     Review of Systems      All other systems reviewed and are otherwise negative except as noted above.  Physical Exam    VS:  There were no vitals taken for this visit. , BMI There is no height or weight on file to calculate BMI.  Wt Readings from Last 3 Encounters:  08/15/22 151 lb (68.5 kg)  08/13/22 154 lb 6 oz (70 kg)  08/01/22 150 lb 6.4 oz (68.2 kg)    Affect appropriate Healthy:  appears stated age HEENT: normal Neck  supple with no adenopathy JVP normal no bruits no thyromegaly Lungs clear with no wheezing and good diaphragmatic motion Heart:  S1/S2 no murmur, no rub, gallop or click PMI normal Abdomen: benighn, BS positve, no tenderness, no AAA no bruit.  No HSM or HJR Distal pulses intact with no bruits No edema Neuro non-focal Skin warm and dry No muscular weakness   Assessment & Plan    Headache:   - negative temporal artery biopsy f/u primary   Paroxysmal Afib/Chronic anticoagulation -continue Eliquis and cardizem [place] VASC 6 -Flecainide d/c due to blurred vision Defers ablation or Rhythmol - F/U Camnitz no burden since December 2022 - Linq monitoring parameters changed to detect 150 bpm or higher  - No PAF burden now   Hypertension -Well controlled.  Continue current medications and low sodium Dash type diet.  Swelling with losartan not on ARB/ACE currently  Diabetes Mellitus -elevated when on steroids - continue insulin, glucophage and Semaglutide  -  A1c 64 07/02/22    Hyperlipidemia -PCP appointment this week and would recommend a lipid panel and LFTs - LDL 69 last checked 07/14/20 labs with primary  - continue crestor   6. Thyroid:   -on synthroid replacement TSH 0.779 10/23/21   7. Chest Pain:   - normal PET CT 06/12/22 observe   Disposition: Follow up  in  a year F/U Camnitz 6 months   Signed, Charlton Haws, MD 08/16/2022, 3:48 PM [facility]

## 2022-08-17 ENCOUNTER — Other Ambulatory Visit: Payer: Medicare Other

## 2022-08-17 ENCOUNTER — Encounter: Payer: Self-pay | Admitting: Gastroenterology

## 2022-08-17 DIAGNOSIS — R8290 Unspecified abnormal findings in urine: Secondary | ICD-10-CM

## 2022-08-17 LAB — POCT URINALYSIS DIP (MANUAL ENTRY)
Bilirubin, UA: NEGATIVE
Blood, UA: NEGATIVE
Glucose, UA: NEGATIVE mg/dL
Ketones, POC UA: NEGATIVE mg/dL
Nitrite, UA: NEGATIVE
Spec Grav, UA: 1.02 (ref 1.010–1.025)
Urobilinogen, UA: 0.2 E.U./dL
pH, UA: 5 (ref 5.0–8.0)

## 2022-08-17 NOTE — Telephone Encounter (Signed)
Picked up by patient

## 2022-08-17 NOTE — Progress Notes (Signed)
[facility] Summary Report / Loop Recorder 

## 2022-08-19 ENCOUNTER — Encounter (HOSPITAL_COMMUNITY): Payer: Self-pay

## 2022-08-19 ENCOUNTER — Ambulatory Visit (HOSPITAL_COMMUNITY)
Admission: EM | Admit: 2022-08-19 | Discharge: 2022-08-19 | Disposition: A | Discharge status: Home / Self Care | Payer: Medicare Other | Attending: Emergency Medicine | Admitting: Emergency Medicine

## 2022-08-19 DIAGNOSIS — M19011 Primary osteoarthritis, right shoulder: Secondary | ICD-10-CM | POA: Diagnosis not present

## 2022-08-19 LAB — URINE CULTURE

## 2022-08-19 MED ORDER — KETOROLAC TROMETHAMINE 30 MG/ML IJ SOLN
30.0000 mg | Freq: Once | INTRAMUSCULAR | Status: AC
  Administered 2022-08-19: 30 mg via INTRAMUSCULAR

## 2022-08-19 MED ORDER — KETOROLAC TROMETHAMINE 30 MG/ML IJ SOLN
INTRAMUSCULAR | Status: AC
  Filled 2022-08-19: qty 1

## 2022-08-19 NOTE — ED Triage Notes (Signed)
Right shoulder pain. Patient has a history of arthritis. States the right shoulder and down into the arm is having increased pain.   Onset Friday and worse this morning.

## 2022-08-19 NOTE — ED Provider Notes (Signed)
[facility]    CSN: [id number] Arrival date & time: 08/19/22  1218      History   Chief Complaint Chief Complaint  Patient presents with   Shoulder Pain    HPI Courtney Pena is a 82 y.o. female.   Patient presents for evaluation of right-sided shoulder pain present for 7 days, known arthritis.  Denies change in activity, injury or trauma.  Limited range of motion unable to lift arm above head.,  Rating pain a 10 out of 10, worsening today, radiating down the arm to the wrist.  Has attempted use of Tylenol which has been ineffective.  Denies numbness or tingling.    Past Medical History:  Diagnosis Date   Allergy    Arthritis    Atrial flutter (HCC) 12/07/2010   converted in ED with 300 mg flecainide   CAD (coronary artery disease)    a. mild per cath in 2004;  b. nonischemic Myoview in March 2012;  c. Lex MV 1/14:  EF 66%, no ischemia   Cataract    Dysrhythmia    A-Fib. has a loop recorder   External hemorrhoids 06/07/2010   Family history of adverse reaction to anesthesia    Daughter has severe N&V   Gastric antral vascular ectasia    source for gi bleed in 07/2011 - Xarelto stopped   GERD (gastroesophageal reflux disease)    Gout    Hyperlipidemia    Hypertension    Hypothyroidism    Neuromuscular disorder (HCC)    neuropathy in feet   Obesity    Personal history of colonic polyps 06/06/2009   cecal polyp   Rheumatoid arthritis (HCC)    Shingles 2023   Sleep apnea    lost weight no longer needs   Type 2 diabetes mellitus with diabetic chronic kidney disease (HCC) 04/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        Patient Active Problem List   Diagnosis Date Noted   Orthostatic hypotension 08/02/2022   Abdominal wall pain 08/02/2022   Dizziness, nonspecific 07/02/2022   Subacute cough 07/02/2022   Right foot drop 05/07/2022   Cold intolerance of hand 12/18/2021   Adenomatous polyp of colon 10/23/2021   Allergic conjunctivitis of both eyes 10/13/2021    Tenosynovitis of left foot 10/19/2020   Hypercoagulable state due to paroxysmal atrial fibrillation (HCC) 10/03/2020   Rheumatoid arthritis (HCC) 03/21/2020   Tinnitus 03/21/2020   Idiopathic chronic gout of multiple sites without tophus 12/09/2019   Lumbar back pain with radiculopathy affecting left lower extremity 07/30/2019   History of CVA (cerebrovascular accident) 11/24/2018   Rotator cuff syndrome of right shoulder 11/24/2018   Hypertensive retinopathy of both eyes 10/14/2018   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 10/14/2018   Vitreomacular adhesion of left eye 10/14/2018   Diabetic retinopathy, nonproliferative, moderate (HCC) 03/07/2018   Headache 11/28/2017   Chronic diastolic CHF (congestive heart failure) (HCC) 08/16/2017   Type 2 diabetes mellitus with diabetic neuropathy, unspecified (HCC) 10/17/2016   Sudden hearing loss, left 09/25/2016   History of cholecystectomy 11/09/2015   Chronic anticoag - Xarelto 05//2017, CHADS2CVASC=5 Eliquis 9/20 07/07/2015   Weakness 06/24/2014   Degenerative arthritis of thumb 02/12/2014   Vitamin D deficiency 09/18/2013   Hyperlipidemia associated with type 2 diabetes mellitus (HCC) 08/22/2012   Pseudophakia, both eyes 03/12/2012   Presence of intraocular lens 03/12/2012   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   Anemia due to GI blood loss 08/12/2011   Pulmonary nodule 12/26/2010  Paroxysmal atrial fibrillation (HCC) 12/14/2010   After-cataract obscuring vision 11/20/2010   Hypothyroidism 04/25/2006   Type 2 diabetes mellitus with diabetic chronic kidney disease (HCC) 04/25/2006   HYPERCHOLESTEROLEMIA 04/25/2006   HYPERTENSION, BENIGN SYSTEMIC 04/25/2006   Coronary atherosclerosis 04/25/2006   Reflux esophagitis 04/25/2006   DIVERTICULOSIS OF COLON 04/25/2006   DJD, UNSPECIFIED 04/25/2006   VERTIGO NOS OR DIZZINESS 04/25/2006   Disequilibrium 04/25/2006    Past Surgical History:  Procedure Laterality Date    ARTERY BIOPSY N/A 08/31/2021   Procedure: LEFT TEMPORAL ARTERY BIOPSY;  Surgeon: Abigail Miyamoto, MD;  Location: [facility];  Service: General;  Laterality: N/A;   BREAST BIOPSY Left    BREAST EXCISIONAL BIOPSY Left    benign   CARDIAC CATHETERIZATION  1999&2004   CARPAL TUNNEL RELEASE Bilateral 2003   CATARACT EXTRACTION Bilateral 1990   CHOLECYSTECTOMY N/A 07/05/2021   Procedure: LAPAROSCOPIC CHOLECYSTECTOMY;  Surgeon: Quentin Ore, MD;  Location: [facility];  Service: General;  Laterality: N/A;   COLONOSCOPY     ESOPHAGOGASTRODUODENOSCOPY  08/13/2011   Procedure: ESOPHAGOGASTRODUODENOSCOPY (EGD);  Surgeon: Iva Boop, MD;  Location: [facility] ENDOSCOPY;  Service: Endoscopy;  Laterality: N/A;   implantable loop recorder placement  07/20/2020   Medtronic Reveal Linq model LNQ 22 ([place] [id number]) implantable loop recorder by Dr Johney Frame    OB History     Gravida  6   Para  4   Term      Preterm      AB  2   Living  4      SAB  2   IAB      Ectopic      Multiple      Live Births               Home Medications    Prior to Admission medications   Medication Sig Start Date End Date Taking? Authorizing Provider  acetaminophen (TYLENOL) 650 MG CR tablet Take 650 mg by mouth every 8 (eight) hours as needed for pain.   Yes [provider]  allopurinol (ZYLOPRIM) 100 MG tablet Take 1 tablet (100 mg total) by mouth daily. 08/13/22  Yes Paige, Lucas Mallow, DO  Apoaequorin (PREVAGEN PO) Take 1 tablet by mouth daily.   Yes [provider]  ascorbic acid (VITAMIN C) 1000 MG tablet Take 1 tablet (1,000 mg total) by mouth daily. 01/12/22  Yes Rolly Salter, MD  blood glucose meter kit and supplies Dispense based on patient and insurance preference. Use up to four times daily as directed. (FOR ICD-10 E10.9, E11.9). 09/14/19  Yes Hensel, Santiago Bumpers, MD  Cholecalciferol (VITAMIN D3) 2000 units TABS Take 4,000 Units by mouth daily.   Yes [provider]   Coenzyme Q10 (COQ10) 100 MG CAPS Take 100 mg by mouth daily. 06/28/05  Yes [provider]  colchicine 0.6 MG tablet Take 1 tablet (0.6 mg total) by mouth as needed. 12/05/20  Yes Hensel, Santiago Bumpers, MD  cyanocobalamin (VITAMIN B12) 1000 MCG tablet Take 1 tablet (1,000 mcg total) by mouth daily. 01/11/22  Yes Rolly Salter, MD  diclofenac Sodium (VOLTAREN) 1 % GEL Apply 2 g topically 4 (four) times daily as needed. 08/13/22  Yes Paige, Victoria J, DO  diltiazem (CARDIZEM CD) 360 MG 24 hr capsule Take 1 capsule (360 mg total) by mouth daily. 07/20/22  Yes Pray, Milus Mallick, MD  diltiazem (CARDIZEM) 30 MG tablet One tab by mouth every six hours as needed for atrial fib  04/09/22  Yes Hensel, Santiago Bumpers, MD  ELIQUIS 5 MG TABS tablet TAKE 1 TABLET BY MOUTH TWICE  DAILY 07/20/22  Yes Westley Chandler, MD  ferrous sulfate 325 (65 FE) MG EC tablet Take 650 mg by mouth daily.    Yes [provider]  fluticasone (FLONASE) 50 MCG/ACT nasal spray Place 2 sprays into both nostrils daily. 07/02/22  Yes Pray, Milus Mallick, MD  glucose blood (ONETOUCH ULTRA) test strip 1 Container by Other route every morning. 05/21/16  Yes [provider]  Insulin Pen Needle 31G X 8 MM MISC Use with pen to inject once daily. 05/13/18  Yes Hensel, Santiago Bumpers, MD  Lancets [facility] ULTRASOFT) lancets Use to check blood sugar daily. 02/13/17  Yes Moses Manners, MD  levothyroxine (SYNTHROID) 112 MCG tablet TAKE 1 TABLET BY MOUTH DAILY  BEFORE BREAKFAST 03/27/21  Yes Hensel, Santiago Bumpers, MD  meclizine (ANTIVERT) 25 MG tablet TAKE 1 TABLET (25 MG TOTAL) BY MOUTH AS NEEDED. 09/29/20  Yes Hensel, Santiago Bumpers, MD  metFORMIN (GLUCOPHAGE-XR) 500 MG 24 hr tablet Take 1 tablet (500 mg total) by mouth 2 (two) times daily with a meal. Decreased dose for renal function 08/16/22 10/15/22 Yes Pray, Milus Mallick, MD  metoprolol succinate (TOPROL-XL) 50 MG 24 hr tablet Take 1 tablet (50 mg total) by mouth in the morning and at bedtime. Take with  or immediately following a meal. 08/13/22  Yes Paige, Lucas Mallow, DO  Multiple Vitamin (MULTI-VITAMINS) TABS Take 1 tablet by mouth daily. 06/28/05  Yes [provider]  nitroGLYCERIN (NITROSTAT) 0.4 MG SL tablet Place 1 tablet (0.4 mg total) under the tongue every 5 (five) minutes as needed for chest pain (Call 911 if chest pain after three doses). 01/03/22  Yes Wendall Stade, MD  omeprazole (PRILOSEC) 40 MG capsule TAKE 1 CAPSULE BY MOUTH DAILY 03/27/22  Yes Hensel, Santiago Bumpers, MD  ONETOUCH ULTRA test strip CHECK BLOOD SUGAR 3 TIMES  DAILY 02/16/22  Yes Hensel, Santiago Bumpers, MD  rosuvastatin (CRESTOR) 10 MG tablet TAKE 1 TABLET BY MOUTH DAILY 03/27/22  Yes Hensel, Santiago Bumpers, MD  Semaglutide, 1 MG/DOSE, 2 MG/1.5ML SOPN Inject 1 mg into the skin once a week. 10/23/21  Yes Hensel, Santiago Bumpers, MD    Family History Family History  Problem Relation Age of Onset   Heart disease Father    Diabetes Maternal Grandfather    Hypertension Mother    Other Mother        brain tumor-benign   Diabetes Daughter        pre-diabeties   Colon polyps Daughter    Thyroid disease Daughter        x 2   Colon cancer Neg Hx    Esophageal cancer Neg Hx    Stomach cancer Neg Hx    Rectal cancer Neg Hx     Social History Social History   Tobacco Use   Smoking status: Former    Packs/day: 2.00    Years: 30.00    Additional pack years: 0.00    Total pack years: 60.00    Types: Cigarettes    Quit date: 02/26/1993    Years since quitting: 29.4   Smokeless tobacco: Never   Tobacco comments:    Former smoker 03/21/22  Vaping Use   Vaping Use: Never used  Substance Use Topics   Alcohol use: No    Alcohol/week: 0.0 standard drinks of alcohol   Drug use: No     Allergies  Actos [pioglitazone], Lisinopril, Nsaids, Penicillins, Sulfamethoxazole, Flecainide, Hctz [hydrochlorothiazide], Iodinated contrast media, Prednisone, Statins, Gabapentin, Losartan potassium, and Zetia [ezetimibe]   Review of  Systems Review of Systems   Physical Exam Triage Vital Signs ED Triage Vitals  Enc Vitals Group     BP 08/19/22 1252 (!) 151/88     Pulse Rate 08/19/22 1252 74     Resp 08/19/22 1252 16     Temp 08/19/22 1252 98.2 F (36.8 C)     Temp Source 08/19/22 1252 Oral     SpO2 08/19/22 1252 93 %     Weight 08/19/22 1252 151 lb 0.2 oz (68.5 kg)     Height 08/19/22 1252 5\' 5"  (1.651 m)     Head Circumference --      Peak Flow --      Pain Score 08/19/22 1251 10     Pain Loc --      Pain Edu? --      Excl. in GC? --    No data found.  Updated Vital Signs BP (!) 151/88 (BP Location: Right Arm)   Pulse 74   Temp 98.2 F (36.8 C) (Oral)   Resp 16   Ht 5\' 5"  (1.651 m)   Wt 151 lb 0.2 oz (68.5 kg)   SpO2 93%   BMI 25.13 kg/m   Visual Acuity Right Eye Distance:   Left Eye Distance:   Bilateral Distance:    Right Eye Near:   Left Eye Near:    Bilateral Near:     Physical Exam Constitutional:      Appearance: Normal appearance.  Eyes:     Extraocular Movements: Extraocular movements intact.  Pulmonary:     Effort: Pulmonary effort is normal.  Musculoskeletal:     Comments: Tenderness present to the anterior and posterior right shoulder without ecchymosis, swelling or deformity, limited range of motion, unable to fully abduct, can extend to approximately 45 degrees, unable to assess, 2+ brachial pulse   Neurological:     Mental Status: She is alert and oriented to person, place, and time. Mental status is at baseline.      UC Treatments / Results  Labs (all labs ordered are listed, but only abnormal results are displayed) Labs Reviewed - No data to display  EKG   Radiology No results found.  Procedures Procedures (including critical care time)  Medications Ordered in UC Medications - No data to display  Initial Impression / Assessment and Plan / UC Course  I have reviewed the triage vital signs and the nursing notes.  Pertinent labs & imaging results  that were available during my care of the patient were reviewed by me and considered in my medical decision making (see chart for details).  Primary osteoarthritis of the right shoulder  Imaging deferred due to lack of new injury, presentation consistent with inflammatory process, discussed with patient, does receive cortisone intra-articular injections by rheumatologist, discussed with patient unable to complete here in office therefore given injection of Toradol and recommended continued supportive measures through [facility], over-the-counter Tylenol and diclofenac gel, advised to avoid NSAIDs due to use of Eliquis, will follow-up with her rheumatologist for reevaluation if symptoms continue to persist Final Clinical Impressions(s) / UC Diagnoses   Final diagnoses:  None   Discharge Instructions   None    ED Prescriptions   None    PDMP not reviewed this encounter.   Valinda Hoar, NP 08/19/22 1340

## 2022-08-19 NOTE — Discharge Instructions (Signed)
Today you are being treated for arthritic flare  You were given an injection of Toradol into your muscle which helps to reduce inflammation and ideally will start to tone down some of your discomfort in about 30 minutes to an hour  You may continue use of Tylenol taking 500 to 1000 mg every 6 hours as needed  You may use heat over the affected area in 10 to 15-minute intervals  You may prop arm on pillows whenever sitting and lying for additional support and comfort  Please notify your rheumatologist of your discomfort for follow-up and evaluation for cortisone joint injection

## 2022-08-21 LAB — CUP PACEART REMOTE DEVICE CHECK
Date Time Interrogation Session: 20240624230126
Implantable Pulse Generator Implant Date: 20220525

## 2022-08-24 ENCOUNTER — Other Ambulatory Visit: Payer: Self-pay | Admitting: Family Medicine

## 2022-08-24 DIAGNOSIS — I480 Paroxysmal atrial fibrillation: Secondary | ICD-10-CM

## 2022-08-27 ENCOUNTER — Encounter: Payer: Self-pay | Admitting: Cardiovascular Disease

## 2022-08-27 ENCOUNTER — Ambulatory Visit: Payer: Medicare Other | Attending: Cardiovascular Disease | Admitting: Cardiovascular Disease

## 2022-08-27 ENCOUNTER — Ambulatory Visit (INDEPENDENT_AMBULATORY_CARE_PROVIDER_SITE_OTHER): Payer: Medicare Other

## 2022-08-27 VITALS — BP 132/82 | HR 67 | Ht 65.0 in | Wt 157.0 lb

## 2022-08-27 DIAGNOSIS — I480 Paroxysmal atrial fibrillation: Secondary | ICD-10-CM

## 2022-08-27 DIAGNOSIS — E782 Mixed hyperlipidemia: Secondary | ICD-10-CM

## 2022-08-27 DIAGNOSIS — I10 Essential (primary) hypertension: Secondary | ICD-10-CM | POA: Diagnosis not present

## 2022-08-27 NOTE — Progress Notes (Unsigned)
    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 Renal US scheduled for 08/31/22. Urinalysis with trace protein, culture negative. Denies problems with urination. Staying hydrated.   Orthostatic hypotension- taking metoprolol 50mg  daily and feeling well. Denies symptoms of orthostasis at home.   Abdominal wall pain- saw gastroenterology, discussed may be due to adhesions. PT offered. Has appt with surgeon in July.  HLD- on statin. Recommended repeat LFT per cardiology. Recent Lipid panel in Dec 2023 with LDL 29.   Offered Medicare annual wellness visit but she notes already well connected and following up.  PERTINENT  PMH / PSH: HFpEF (last ECHO G1DD 12/2021), T2DM with diabetic neuropathy and retinopathy, rheumatoid arthritis, paroxysmal Afib, gout, HTN, h/o CVA, HLD, h/o GI bleed, h/o colon polyp, hypothyroidism   OBJECTIVE:   BP 128/70   Pulse 71   Ht 5\' 5"  (1.651 m)   Wt 158 lb (71.7 kg)   SpO2 98%   BMI 26.29 kg/m   General: A&O, NAD HEENT: No sign of trauma, EOM grossly intact Cardiac: RRR, no m/r/g Respiratory: CTAB, normal WOB, no w/c/r GI: non-distended Extremities: NTTP, no peripheral edema. Neuro: Normal gait, moves all four extremities appropriately. Psych: Appropriate mood and affect   ASSESSMENT/PLAN:   Orthostatic hypotension Symptoms resolved, will continue metoprolol 50mg  daily, updated med list  Paroxysmal atrial fibrillation (HCC) Well controlled, continue diltiazem, metoprolol, and Eliquis  Abdominal wall pain Related to previous shingles outbreak Seen by GI, concern for adhesions, PT offered Seeing surgeon  in July at [facility]  History of gout Recheck uric acid today, continue daily allopurinol per rheumatology, goal uric acid < 6  Elevated serum creatinine AKI vs CKD, no troubles with urination Urinalysis with only trace protein Renal US scheduled for 08/31/22 Repeat Cr to ensure stability,  nephrology referral has been placed Med list reviewed and no nephrotoxic meds  Hyperlipidemia associated with type 2 diabetes mellitus (HCC) LDL at goal, checking LFTs per cardiology recommendations, lipid panel up to date     Billey Co, MD [facility]

## 2022-08-27 NOTE — Patient Instructions (Signed)
Medication Instructions:  Your physician recommends that you continue on your current medications as directed. Please refer to the Current Medication list given to you today.  *If you need a refill on your cardiac medications before your next appointment, please call your pharmacy*  Lab Work: If you have labs (blood work) drawn today and your tests are completely normal, you will receive your results only by: MyChart Message (if you have MyChart) OR A paper copy in the mail If you have any lab test that is abnormal or we need to change your treatment, we will call you to review the results.  Testing/Procedures: None ordered today.  Follow-Up: At [facility], you and your health needs are our priority.  As part of our continuing mission to provide you with exceptional heart care, we have created designated Provider Care Teams.  These Care Teams include your primary Cardiologist (physician) and Advanced Practice Providers (APPs -  Physician Assistants and Nurse Practitioners) who all work together to provide you with the care you need, when you need it.  We recommend signing up for the patient portal called "MyChart".  Sign up information is provided on this After Visit Summary.  MyChart is used to connect with patients for Virtual Visits (Telemedicine).  Patients are able to view lab/test results, encounter notes, upcoming appointments, etc.  Non-urgent messages can be sent to your provider as well.   To learn more about what you can do with MyChart, go to [email or web address].    Your next appointment:   1 year(s)  Provider:   Peter Nishan, MD      

## 2022-08-28 ENCOUNTER — Encounter: Payer: Self-pay | Admitting: Family Medicine

## 2022-08-28 ENCOUNTER — Ambulatory Visit: Payer: Medicare Other | Admitting: Family Medicine

## 2022-08-28 VITALS — BP 128/70 | HR 71 | Ht 65.0 in | Wt 158.0 lb

## 2022-08-28 DIAGNOSIS — I480 Paroxysmal atrial fibrillation: Secondary | ICD-10-CM | POA: Diagnosis not present

## 2022-08-28 DIAGNOSIS — E785 Hyperlipidemia, unspecified: Secondary | ICD-10-CM

## 2022-08-28 DIAGNOSIS — I951 Orthostatic hypotension: Secondary | ICD-10-CM | POA: Diagnosis not present

## 2022-08-28 DIAGNOSIS — E1169 Type 2 diabetes mellitus with other specified complication: Secondary | ICD-10-CM

## 2022-08-28 DIAGNOSIS — R7989 Other specified abnormal findings of blood chemistry: Secondary | ICD-10-CM | POA: Diagnosis not present

## 2022-08-28 DIAGNOSIS — Z8739 Personal history of other diseases of the musculoskeletal system and connective tissue: Secondary | ICD-10-CM | POA: Diagnosis not present

## 2022-08-28 DIAGNOSIS — R109 Unspecified abdominal pain: Secondary | ICD-10-CM

## 2022-08-28 MED ORDER — METOPROLOL SUCCINATE ER 50 MG PO TB24: 50.0000 mg | ORAL_TABLET | Freq: Every day | ORAL | 3 refills | Status: DC

## 2022-08-28 NOTE — Assessment & Plan Note (Signed)
LDL at goal, checking LFTs per cardiology recommendations, lipid panel up to date

## 2022-08-28 NOTE — Assessment & Plan Note (Signed)
Symptoms resolved, will continue metoprolol 50mg  daily, updated med list

## 2022-08-28 NOTE — Assessment & Plan Note (Signed)
Related to previous shingles outbreak Seen by GI, concern for adhesions, PT offered Seeing surgeon  in July at [facility]

## 2022-08-28 NOTE — Assessment & Plan Note (Signed)
AKI vs CKD, no troubles with urination Urinalysis with only trace protein Renal US scheduled for 08/31/22 Repeat Cr to ensure stability, nephrology referral has been placed Med list reviewed and no nephrotoxic meds

## 2022-08-28 NOTE — Patient Instructions (Addendum)
It was wonderful to see you today.  Please bring ALL of your medications with you to every visit.   Today we talked about:  - We will check your lab work for kidney functino and gout, And I will call you with results  -The nephrologist should be reaching out to your for an appointment  Please call back in 1 month to make an appt in early October 2024  Thank you for choosing [facility].   Please call [phone] with any questions about today's appointment.  Please arrive at least 15 minutes prior to your scheduled appointments.   If you had blood work today, I will send you a MyChart message or a letter if results are normal. Otherwise, I will give you a call.   If you had a referral placed, they will call you to set up an appointment. Please give [name] a call if you don't hear back in the next 2 weeks.   If you need additional refills before your next appointment, please call your pharmacy first.   [name], MD  Family Medicine

## 2022-08-28 NOTE — Assessment & Plan Note (Addendum)
Recheck uric acid today, continue daily allopurinol per rheumatology, goal uric acid < 6

## 2022-08-28 NOTE — Assessment & Plan Note (Signed)
Well controlled, continue diltiazem, metoprolol, and Eliquis

## 2022-08-29 ENCOUNTER — Telehealth: Payer: Self-pay | Admitting: Family Medicine

## 2022-08-29 ENCOUNTER — Other Ambulatory Visit: Payer: Medicare Other

## 2022-08-29 DIAGNOSIS — E875 Hyperkalemia: Secondary | ICD-10-CM

## 2022-08-29 LAB — BASIC METABOLIC PANEL
BUN/Creatinine Ratio: 26 (ref 12–28)
BUN: 32 mg/dL — ABNORMAL HIGH (ref 8–27)
CO2: 24 mmol/L (ref 20–29)
Calcium: 10.4 mg/dL — ABNORMAL HIGH (ref 8.7–10.3)
Chloride: 102 mmol/L (ref 96–106)
Creatinine, Ser: 1.25 mg/dL — ABNORMAL HIGH (ref 0.57–1.00)
Glucose: 199 mg/dL — ABNORMAL HIGH (ref 70–99)
Potassium: 5.2 mmol/L (ref 3.5–5.2)
Sodium: 136 mmol/L (ref 134–144)
eGFR: 43 mL/min/{1.73_m2} — ABNORMAL LOW (ref >59)

## 2022-08-29 LAB — COMPREHENSIVE METABOLIC PANEL
ALT: 18 IU/L (ref 0–32)
AST: 16 IU/L (ref 0–40)
Albumin: 4.4 g/dL (ref 3.7–4.7)
Alkaline Phosphatase: 60 IU/L (ref 44–121)
BUN/Creatinine Ratio: 25 (ref 12–28)
BUN: 31 mg/dL — ABNORMAL HIGH (ref 8–27)
Bilirubin Total: <0.2 mg/dL (ref 0.0–1.2)
CO2: 21 mmol/L (ref 20–29)
Calcium: 10.3 mg/dL (ref 8.7–10.3)
Chloride: 101 mmol/L (ref 96–106)
Creatinine, Ser: 1.23 mg/dL — ABNORMAL HIGH (ref 0.57–1.00)
Globulin, Total: 2.6 g/dL (ref 1.5–4.5)
Glucose: 162 mg/dL — ABNORMAL HIGH (ref 70–99)
Potassium: 5.3 mmol/L — ABNORMAL HIGH (ref 3.5–5.2)
Sodium: 137 mmol/L (ref 134–144)
Total Protein: 7 g/dL (ref 6.0–8.5)
eGFR: 44 mL/min/{1.73_m2} — ABNORMAL LOW (ref >59)

## 2022-08-29 LAB — URIC ACID: Uric Acid: 5 mg/dL (ref 3.1–7.9)

## 2022-08-29 NOTE — Telephone Encounter (Signed)
Called patient, repeat STAT K 5.2, within normal limits. Cr stable.  Discussed low potassium diet. Continue with plans for renal US on Friday and nephrology referral.  [name] MD

## 2022-08-29 NOTE — Telephone Encounter (Signed)
Called patient, discussed hyperkalemia on labs and she is willing to come back in for lab draw in 15 min to repeat. Will send STAT as tomorrow is holiday weekend so we can get labs back today.  If remains elevated, will do lokelma 10mg  x 2 days and repeat BMP on Friday.  Not on any meds that cause hyperkalemia.  [name] MD

## 2022-08-31 ENCOUNTER — Ambulatory Visit
Admission: RE | Admit: 2022-08-31 | Discharge: 2022-08-31 | Disposition: A | Discharge status: Home / Self Care | Payer: Medicare Other | Source: Ambulatory Visit | Attending: Family Medicine | Admitting: Family Medicine

## 2022-08-31 DIAGNOSIS — N179 Acute kidney failure, unspecified: Secondary | ICD-10-CM

## 2022-09-05 ENCOUNTER — Ambulatory Visit: Payer: Medicare Other | Admitting: Physical Therapy

## 2022-09-05 ENCOUNTER — Encounter: Payer: Self-pay | Admitting: Physical Therapy

## 2022-09-05 ENCOUNTER — Other Ambulatory Visit: Payer: Self-pay

## 2022-09-05 DIAGNOSIS — M25511 Pain in right shoulder: Secondary | ICD-10-CM

## 2022-09-05 DIAGNOSIS — M25611 Stiffness of right shoulder, not elsewhere classified: Secondary | ICD-10-CM

## 2022-09-05 DIAGNOSIS — R293 Abnormal posture: Secondary | ICD-10-CM | POA: Diagnosis not present

## 2022-09-05 DIAGNOSIS — G8929 Other chronic pain: Secondary | ICD-10-CM | POA: Diagnosis not present
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
## 2022-09-07 ENCOUNTER — Encounter: Payer: Self-pay | Admitting: Family Medicine

## 2022-09-10 ENCOUNTER — Telehealth: Payer: Self-pay | Admitting: Family Medicine

## 2022-09-10 NOTE — Telephone Encounter (Signed)
Patient is calling stating she has received her results on her MyChart but doesn't understand and states doctor said she was going to call patient and she hasn't heard anything patient would like for doctor to contact her.   Please Advise.   Thanks!

## 2022-09-11 ENCOUNTER — Telehealth: Payer: Self-pay | Admitting: Family Medicine

## 2022-09-11 NOTE — Telephone Encounter (Signed)
See phone note, called and discsused with patient

## 2022-09-11 NOTE — Telephone Encounter (Signed)
Called patient and discussed renal US results. 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 Answered all questions and concerns, no changes to medications at this time. Discussed that letter was sent as results did not show any urgent concerns, and apologized for any confusion about how results would be delivered. Discussed that in the future if results are normal without urgency I often sent a patient letter, and that if anything urgent I will call and discuss over the phone. Also delay in reading [name] it was not read until 09/06/22, apologized for that delay to patient.  Blood pressure has been low recently with orthostatic hypotension and she is already on some medications for HR, but could consider low dose ARB or SGLT2 in future for renal protection if BP will tolerated.  She notes she has not gotten call for nephrology appt yet, message sent to referrals to clarify. Discussed if she does not hear from nephrology office in 2 weeks to give our office a call back.  [name] MD

## 2022-09-13 ENCOUNTER — Other Ambulatory Visit: Payer: Self-pay | Admitting: Family Medicine

## 2022-09-17 NOTE — Progress Notes (Signed)
[facility] Summary Report / Loop Recorder 

## 2022-09-24 ENCOUNTER — Encounter: Payer: Self-pay | Admitting: Physical Therapy

## 2022-09-24 ENCOUNTER — Ambulatory Visit: Payer: Medicare Other | Admitting: Physical Therapy

## 2022-09-24 DIAGNOSIS — G8929 Other chronic pain: Secondary | ICD-10-CM

## 2022-09-24 DIAGNOSIS — M25611 Stiffness of right shoulder, not elsewhere classified: Secondary | ICD-10-CM

## 2022-09-24 DIAGNOSIS — R293 Abnormal posture: Secondary | ICD-10-CM

## 2022-09-24 DIAGNOSIS — M25511 Pain in right shoulder: Secondary | ICD-10-CM | POA: Diagnosis not present
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
## 2022-09-28 ENCOUNTER — Encounter: Payer: Self-pay | Admitting: Rehabilitative and Restorative Service Providers"

## 2022-09-28 ENCOUNTER — Ambulatory Visit: Payer: Medicare Other | Admitting: Rehabilitative and Restorative Service Providers"

## 2022-09-28 DIAGNOSIS — M25511 Pain in right shoulder: Secondary | ICD-10-CM | POA: Diagnosis not present

## 2022-09-28 DIAGNOSIS — R293 Abnormal posture: Secondary | ICD-10-CM

## 2022-09-28 DIAGNOSIS — M25611 Stiffness of right shoulder, not elsewhere classified: Secondary | ICD-10-CM

## 2022-09-28 DIAGNOSIS — G8929 Other chronic pain: Secondary | ICD-10-CM | POA: Diagnosis not present
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
## 2022-10-01 ENCOUNTER — Ambulatory Visit (INDEPENDENT_AMBULATORY_CARE_PROVIDER_SITE_OTHER): Payer: Medicare Other

## 2022-10-01 DIAGNOSIS — I480 Paroxysmal atrial fibrillation: Secondary | ICD-10-CM | POA: Diagnosis not present

## 2022-10-03 ENCOUNTER — Encounter: Payer: Medicare Other | Admitting: Rehabilitative and Restorative Service Providers"
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
## 2022-10-08 ENCOUNTER — Ambulatory Visit: Payer: Medicare Other | Admitting: Physical Therapy

## 2022-10-08 ENCOUNTER — Encounter: Payer: Self-pay | Admitting: Physical Therapy

## 2022-10-08 DIAGNOSIS — M25611 Stiffness of right shoulder, not elsewhere classified: Secondary | ICD-10-CM | POA: Diagnosis not present

## 2022-10-08 DIAGNOSIS — G8929 Other chronic pain: Secondary | ICD-10-CM

## 2022-10-08 DIAGNOSIS — M25511 Pain in right shoulder: Secondary | ICD-10-CM

## 2022-10-08 DIAGNOSIS — R293 Abnormal posture: Secondary | ICD-10-CM

## 2022-10-16 NOTE — Progress Notes (Signed)
[facility] Summary Report / Loop Recorder 

## 2022-10-23 ENCOUNTER — Encounter: Payer: Self-pay | Admitting: Pharmacist

## 2022-10-23 ENCOUNTER — Ambulatory Visit: Payer: Medicare Other | Admitting: Pharmacist

## 2022-10-23 ENCOUNTER — Telehealth: Payer: Self-pay

## 2022-10-23 ENCOUNTER — Ambulatory Visit: Payer: Medicare Other | Admitting: Family Medicine

## 2022-10-23 VITALS — BP 162/77 | HR 69 | Ht 64.0 in | Wt 154.8 lb

## 2022-10-23 VITALS — BP 122/78 | HR 69 | Ht 64.0 in | Wt 154.0 lb

## 2022-10-23 DIAGNOSIS — E038 Other specified hypothyroidism: Secondary | ICD-10-CM | POA: Diagnosis not present

## 2022-10-23 DIAGNOSIS — Z794 Long term (current) use of insulin: Secondary | ICD-10-CM | POA: Diagnosis not present

## 2022-10-23 DIAGNOSIS — N1832 Chronic kidney disease, stage 3b: Secondary | ICD-10-CM

## 2022-10-23 DIAGNOSIS — E1122 Type 2 diabetes mellitus with diabetic chronic kidney disease: Secondary | ICD-10-CM

## 2022-10-23 DIAGNOSIS — I10 Essential (primary) hypertension: Secondary | ICD-10-CM

## 2022-10-23 DIAGNOSIS — I480 Paroxysmal atrial fibrillation: Secondary | ICD-10-CM

## 2022-10-23 DIAGNOSIS — E1140 Type 2 diabetes mellitus with diabetic neuropathy, unspecified: Secondary | ICD-10-CM

## 2022-10-23 LAB — POCT GLYCOSYLATED HEMOGLOBIN (HGB A1C): HbA1c, POC (controlled diabetic range): 6.8 % (ref 0.0–7.0)

## 2022-10-23 MED ORDER — DICLOFENAC SODIUM 1 % EX GEL: 2.0000 g | Freq: Four times a day (QID) | CUTANEOUS | 3 refills | Status: DC | PRN

## 2022-10-23 NOTE — Progress Notes (Signed)
S:     Chief Complaint  Patient presents with   Diabetes Management Plan   Hypertension   82 y.o. female who presents for diabetes evaluation, education, and management. Patient arrives in good spirits and presents without any assistance.   Patient was referred and last seen by Primary Care Provider, Dr. Miquel Dunn, on 08/28/22.  Patient is also scheduled to see Dr. Miquel Dunn today.   PMH is significant for polypharmacy and multiple medical problems.  At last visit, continued all medications as before and noted to consider dose reduction to 20 mg once daily to minimize PPI related side effects including bone density.   Current diabetes medications include: Ozempic (semaglutide) 1 mg once weekly, metformin XR 500 mg once daily due to decreased renal function Current hypertension medications include: diltiazem 360 mg once daily, diltiazem 30 mg q6h prn Afib, metoprolol succinate 50 mg once daily Current hyperlipidemia medications include: Crestor (rosuvastatin) 10 mg once daily  Patient reports adherence to taking all medications as prescribed.   Do you feel that your medications are working for you? yes Have you been experiencing any side effects to the medications prescribed? yes Do you have any problems obtaining medications due to transportation or finances? No  Insurance coverage: [employer]  Reports at-home blood sugars stay under 140 mg/dL unless patient receives steroid shots for shoulder arthritis.  Patient reports occasional nocturia (nighttime urination).  Patient reports vision changes. Is seeing eye doctor to get new glasses.  Patient reports experiencing dizziness to prior blood pressure medication regimen. Stated Dr. Miquel Dunn had switched the metoprolol to carvedilol and then back to metoprolol 50 mg once daily. Patient reports Dr. Miquel Dunn reducing the metformin to 500 mg once daily due to decreased renal function. Reports tolerating these changes well.   O:    Review of Systems  All other systems reviewed and are negative.   Physical Exam Constitutional:      Appearance: Normal appearance. She is normal weight.  Neurological:     Mental Status: She is alert.  Psychiatric:        Mood and Affect: Mood normal.        Behavior: Behavior normal.        Thought Content: Thought content normal.        Judgment: Judgment normal.     Lab Results  Component Value Date   HGBA1C 6.8 10/23/2022   Vitals:   10/23/22 0916 10/23/22 0919  BP: (!) 152/73 (!) 162/77  Pulse: 69   SpO2: 100%     Lipid Panel     Component Value Date/Time   CHOL 109 02/22/2022 1102   TRIG 107 02/22/2022 1102   HDL 61 02/22/2022 1102   CHOLHDL 1.8 02/22/2022 1102   CHOLHDL 4.7 01/27/2015 1238   VLDL 28 01/27/2015 1238   LDLCALC 29 02/22/2022 1102   LDLDIRECT 62 11/24/2018 1211   LDLDIRECT 178 (H) 05/12/2015 1229    Clinical Atherosclerotic Cardiovascular Disease (ASCVD): Yes  Known Coronary atherosclerosis The ASCVD Risk score (Arnett DK, et al., 2019) failed to calculate for the following reasons:   The 2019 ASCVD risk score is only valid for ages 61 to 77   A/P: Diabetes longstanding currently well controlled with recent A1c of 6. Patient is able to verbalize appropriate hypoglycemia management plan. Medication adherence appears good.  -Continued GLP-1 Ozempic (semaglutide) at 1mg  weekly.  Consider possible dose decrease vs. interval extension in the future.  -Continued metformin 500 mg once daily due to  decreased renal function. -Consider SGLT2 for renal protection. -Extensively discussed pathophysiology of diabetes, recommended lifestyle interventions, dietary effects on blood sugar control.  -Patient educated on purpose, proper use, and potential adverse effects..  -Extensively discussed pathophysiology of diabetes, recommended lifestyle interventions, dietary effects on blood sugar control.  -Counseled on s/sx of and management of hypoglycemia.    ASCVD risk - primary prevention in patient with diabetes. Last LDL is 29 at goal of <70 mg/dL.  -Continued rosuvastatin 10 mg once daily.  Hypertension longstanding currently uncontrolled with office reading of 162/77 mm Hg. Blood pressure goal of <130  mmHg if tolerated. Given patient denies orthostasis and is tolerating this regimen, will continue same. Medication adherence optimal.  - Continued diltiazem 360 mg once daily. - Continued diltiazem 30 mg q6h prn Afib. - Continued metoprolol succinate 50 mg once daily.  Gastric antral vascular ectasia - patient inquired if PPI therapy was indefinite.  -Discussed trial of reduced dose to three times per week and return to daily dosing if any symptoms returned.  -attempt dose reduction to three times weekly for omeprazole 40mg .   Written patient instructions provided. Patient verbalized understanding of treatment plan.  Total time in face to face counseling 34 minutes.    Follow-up:  Pharmacist PRN PCP clinic visit in PRN Patient seen with Andee Poles, PharmD Candidate.

## 2022-10-23 NOTE — Assessment & Plan Note (Signed)
Diabetes longstanding currently well controlled with recent A1c of 6. Patient is able to verbalize appropriate hypoglycemia management plan. Medication adherence appears good.  -Continued GLP-1 Ozempic (semaglutide) at 1mg  weekly.  Consider possible dose decrease vs. interval extension in the future.  -Continued metformin 500 mg once daily due to decreased renal function. -Consider SGLT2 for renal protection. -Extensively discussed pathophysiology of diabetes, recommended lifestyle interventions, dietary effects on blood sugar control.  -Patient educated on purpose, proper use, and potential adverse effects..  -Extensively discussed pathophysiology of diabetes, recommended lifestyle interventions, dietary effects on blood sugar control.  -Counseled on s/sx of and management of hypoglycemia.   Hypertension longstanding currently uncontrolled with office reading of 162/77 mm Hg. Blood pressure goal of <130/80  mmHg. Given patient denies orthostasis and is tolerating this regimen, will continue this. Medication adherence optimal.  - Continued diltiazem 360 mg once daily. - Continued diltiazem 30 mg q6h prn Afib. - Continued metoprolol succinate 50 mg once daily.

## 2022-10-23 NOTE — Assessment & Plan Note (Signed)
BP improved on recheck in office, continue home medications, no further symptoms of orthostasis

## 2022-10-23 NOTE — Assessment & Plan Note (Addendum)
>>  ASSESSMENT AND PLAN FOR TYPE 2 DIABETES MELLITUS WITH DIABETIC CHRONIC KIDNEY DISEASE (HCC) WRITTEN ON 10/23/2022  5:54 PM BY Javarian Jakubiak E, MD  Check A1c today Discussed SGTL2 inhibitor for diabetes management and stopping metformin, pt would like to consider this before making changes Getting eye exam next month, follows with podiatry Appt with nephrology in Oct for CKD, avoid nephrotoxic medications   >>ASSESSMENT AND PLAN FOR TYPE 2 DIABETES MELLITUS WITH DIABETIC NEUROPATHY, UNSPECIFIED (HCC) WRITTEN ON 10/23/2022  5:52 PM BY Elaf Clauson E, MD  A1c 6.8 today, at goal, discussed stopping metformin and switching to SGLT2 for renal protection, she would like to consider this

## 2022-10-23 NOTE — Assessment & Plan Note (Signed)
Well controlled, continue home meds

## 2022-10-23 NOTE — Assessment & Plan Note (Signed)
A1c 6.8 today, at goal, discussed stopping metformin and switching to SGLT2 for renal protection, she would like to consider this

## 2022-10-23 NOTE — Patient Instructions (Signed)
It was nice to see you today!  Your goal blood sugar is 80-130 before eating and less than 180 after eating.  Medication Changes: Decrease omeprazole to 3 days a week.  Continue all other medication the same.   Monitor blood sugars at home and keep a log (glucometer or piece of paper) to bring with you to your next visit.  Keep up the good work with diet and exercise. Aim for a diet full of vegetables, fruit and lean meats (chicken, [place], fish). Try to limit salt intake by eating fresh or frozen vegetables (instead of canned), rinse canned vegetables prior to cooking and do not add any additional salt to meals.

## 2022-10-23 NOTE — Assessment & Plan Note (Signed)
Repeat TSH today, pt notes feeling cold often, taking all but once or twice per week

## 2022-10-23 NOTE — Assessment & Plan Note (Signed)
Hypertension longstanding currently uncontrolled with office reading of 162/77 mm Hg. Improved slightly on repeat to 152/73 mmHg.  Blood pressure goal of <130 mmHg systolic if tolerated. Given patient denies orthostasis and is tolerating this regimen, will continue same. Medication adherence optimal.  - Continued diltiazem 360 mg once daily. - Continued diltiazem 30 mg q6h prn Afib. - Continued metoprolol succinate 50 mg once daily.

## 2022-10-23 NOTE — Telephone Encounter (Signed)
Pt due for refills of Ozempic 1mg  dose pens.   Last shipment never sent (Dr. Leveda Anna state license expired).   Refill form in Dr. Carlena Bjornstad box.

## 2022-10-23 NOTE — Progress Notes (Signed)
    SUBJECTIVE:   CHIEF COMPLAINT / HPI:   [id number] - referred to nephrology, has not seen them yet. Has appt in October.   Hypothyroidism- taking 112 mcg regularly, does feel cold all the time. Misses dose once or twice per week.   pAfib- on diltiazem, metoprolol 50mg  once daily, Eliquis. No blood in stool or dark black stools. About three weeks ago needed to take a PRN diltiazem for palpitations which then stopped, none since. NO chest pain.   T2DM- on metformin 500mg  once daily. Checks BG but not every day. Notes blood sugars run from 120s-140s.  PERTINENT  PMH / PSH: HFpEF (last ECHO G1DD 12/2021), T2DM with diabetic neuropathy and retinopathy, rheumatoid arthritis, paroxysmal Afib, gout, HTN, h/o CVA, HLD, h/o GI bleed, h/o colon polyp, hypothyroidism   OBJECTIVE:   BP 122/78   Pulse 69   Ht 5\' 4"  (1.626 m)   Wt 154 lb (69.9 kg)   SpO2 100%   BMI 26.43 kg/m   General: A&O, NAD HEENT: No sign of trauma, EOM grossly intact Cardiac: RRR, no m/r/g Respiratory: CTAB, normal WOB, no w/c/r GI: Soft, NTTP, non-distended  Extremities: NTTP, no peripheral edema. Neuro: Normal gait, moves all four extremities appropriately. Psych: Appropriate mood and affect   ASSESSMENT/PLAN:   Type 2 diabetes mellitus with diabetic chronic kidney disease (HCC) Check A1c today Discussed SGTL2 inhibitor for diabetes management and stopping metformin, pt would like to consider this before making changes Getting eye exam next month, follows with podiatry Appt with nephrology in Oct for CKD, avoid nephrotoxic medications  Type 2 diabetes mellitus with diabetic neuropathy, unspecified (HCC) A1c 6.8 today, at goal, discussed stopping metformin and switching to SGLT2 for renal protection, she would like to consider this  HYPERTENSION, BENIGN SYSTEMIC BP improved on recheck in office, continue home medications, no further symptoms of orthostasis  Hypothyroidism Repeat TSH today, pt notes feeling  cold often, taking all but once or twice per week  Paroxysmal atrial fibrillation (HCC) Well controlled, continue home meds     Billey Co, MD [facility]

## 2022-10-23 NOTE — Patient Instructions (Addendum)
It was wonderful to see you today.  Please bring ALL of your medications with you to every visit.   Today we talked about:  We will check your A1c today. Your blood pressure looked good. I refilled your diclofenac. We can also check your TSH today.  Lets see you back in 2-3 months  Thank you for choosing [facility].   Please call [phone] with any questions about today's appointment.  Please arrive at least 15 minutes prior to your scheduled appointments.   If you had blood work today, I will send you a MyChart message or a letter if results are normal. Otherwise, I will give you a call.   If you had a referral placed, they will call you to set up an appointment. Please give [name] a call if you don't hear back in the next 2 weeks.   If you need additional refills before your next appointment, please call your pharmacy first.   [name], MD  Family Medicine

## 2022-10-24 ENCOUNTER — Emergency Department
Admission: EM | Admit: 2022-10-24 | Discharge: 2022-10-24 | Disposition: A | Discharge status: Home / Self Care | Payer: Medicare Other | Attending: Emergency Medicine | Admitting: Emergency Medicine

## 2022-10-24 ENCOUNTER — Emergency Department: Payer: Medicare Other

## 2022-10-24 ENCOUNTER — Encounter: Payer: Self-pay | Admitting: *Deleted

## 2022-10-24 ENCOUNTER — Telehealth: Payer: Self-pay | Admitting: Family Medicine

## 2022-10-24 ENCOUNTER — Other Ambulatory Visit: Payer: Self-pay

## 2022-10-24 DIAGNOSIS — I10 Essential (primary) hypertension: Secondary | ICD-10-CM | POA: Diagnosis not present

## 2022-10-24 DIAGNOSIS — E119 Type 2 diabetes mellitus without complications: Secondary | ICD-10-CM | POA: Insufficient documentation

## 2022-10-24 DIAGNOSIS — R930 Abnormal findings on diagnostic imaging of skull and head, not elsewhere classified: Secondary | ICD-10-CM | POA: Diagnosis not present

## 2022-10-24 DIAGNOSIS — H5713 Ocular pain, bilateral: Secondary | ICD-10-CM | POA: Insufficient documentation

## 2022-10-24 LAB — TSH RFX ON ABNORMAL TO FREE T4: TSH: 4.29 u[IU]/mL (ref 0.450–4.500)

## 2022-10-24 LAB — CBG MONITORING, ED: Glucose-Capillary: 88 mg/dL (ref 70–99)

## 2022-10-24 MED ORDER — ACETAMINOPHEN 500 MG PO TABS
1000.0000 mg | ORAL_TABLET | Freq: Once | ORAL | Status: AC
  Administered 2022-10-24: 1000 mg via ORAL
  Filled 2022-10-24: qty 2

## 2022-10-24 NOTE — ED Notes (Signed)
Provided pt with diet shasta, graham crackers, saltines, and peanut butter packets as requested.

## 2022-10-24 NOTE — ED Notes (Signed)
Provided pt with discharge instructions and education. All of pt questions answered. Pt in possession of all belongings. Pt AAOX4 and stable at time of discharge. Pt wheeled to family vehicle

## 2022-10-24 NOTE — Discharge Instructions (Signed)
Your testing today was overall reassuring.  Please contact your ophthalmologist to arrange close follow-up regarding your symptoms.  Return to the ER for new or worsening symptoms.

## 2022-10-24 NOTE — Telephone Encounter (Signed)
Called patient to discuss TSH results.  She shares that she had her eyes dilated yesterday and since then has has severe pain, blurred vision, and unable to see well. She notes she called eye doctor and spoke to RN and was supposed to hear back from eye doctor but has not yet. I advised to call ophthalmologist line back to discuss with on-call provider for advice, and that if she could not reach provider advised being seen at eye emergency care/urgent care to check pressure in her eyes as I worry about increased intraocular pressure versus infection with recent eye dilation. Pt in agreement and appreciated the call.  [name] MD

## 2022-10-24 NOTE — ED Notes (Signed)
Assumed care of pt at this time. Pt is AAXO4, ambulated w/o assistance from triage. Pt c/o eye pain since yesterday after getting eyes dilated at the optometrist. Pt also states she has not taken her evening meds to include BP medicine.

## 2022-10-24 NOTE — ED Notes (Signed)
Pt requesting food, stated she has not eaten anything and feels like her blood sugar is getting low. Informed pt no food until CT head results are back but offered to check blood sugar.

## 2022-10-24 NOTE — ED Notes (Signed)
Patient transported to CT 

## 2022-10-24 NOTE — Progress Notes (Signed)
Reviewed and agree with Dr Koval's plan.   

## 2022-10-24 NOTE — ED Triage Notes (Signed)
Pt saw eye doctor yesterday and had eyes dilated.  Pt woke up today with eye pain and photophobia.  Pt wearing sunglasses.   Pt alert, speech clear.

## 2022-10-24 NOTE — ED Notes (Signed)
ED Provider at bedside. 

## 2022-10-24 NOTE — ED Provider Notes (Signed)
[facility] Provider Note    Event Date/Time   First MD Initiated Contact with Patient 10/24/22 1919     (approximate)   History   Eye Pain   HPI  Courtney Pena is a 82 y.o. female with history of T2DM, hypertension, cataracts, diabetic retinopathy presenting to the emergency department for evaluation of eye pain.  Patient saw her eye specialist yesterday and had a dilated eye exam.  She felt slight light sensitivity yesterday, but when she woke up this morning had significant sensitivity to light with some pain around her bilateral eyes, not specifically worse on one side.  No noted visual changes.  Otherwise denies headache.  No numbness, tingling, focal weakness.  No fevers or chills.      Physical Exam   Triage Vital Signs: ED Triage Vitals  Encounter Vitals Group     BP 10/24/22 1914 (!) 163/96     Systolic BP Percentile --      Diastolic BP Percentile --      Pulse Rate 10/24/22 1914 84     Resp 10/24/22 1914 18     Temp 10/24/22 1914 98.4 F (36.9 C)     Temp Source 10/24/22 1914 Oral     SpO2 10/24/22 1914 100 %     Weight 10/24/22 1911 152 lb (68.9 kg)     Height 10/24/22 1911 5\' 5"  (1.651 m)     Head Circumference --      Peak Flow --      Pain Score 10/24/22 1911 10     Pain Loc --      Pain Education --      Exclude from Growth Chart --     Most recent vital signs: Vitals:   10/24/22 1914  BP: (!) 163/96  Pulse: 84  Resp: 18  Temp: 98.4 F (36.9 C)  SpO2: 100%     General: Awake, interactive  Eye: Visual Acuity: Right 20/40, Left 20/25 with corrective lenses IOP: 10 on the right, 11 on the left  Visual fields: Intact Pupils: 3 mm, round, reactive EOMI: No nystagmus Orbit, lids, lashes: Normal Sclera: White CV:  Regular rate, good peripheral perfusion.  Resp:  Lungs clear, unlabored respirations.  Abd:  Soft, nondistended.  Neuro:  Symmetric facial movement, fluid speech   ED Results / Procedures /  Treatments   Labs (all labs ordered are listed, but only abnormal results are displayed) Labs Reviewed  CBG MONITORING, ED     EKG EKG independently reviewed interpreted by myself (ER attending) demonstrates:    RADIOLOGY Imaging independently reviewed and interpreted by myself demonstrates:  CT head without acute bleed  PROCEDURES:  Critical Care performed: No  Procedures   MEDICATIONS ORDERED IN ED: Medications  acetaminophen (TYLENOL) tablet 1,000 mg (1,000 mg Oral Given 10/24/22 2038)     IMPRESSION / MDM / ASSESSMENT AND PLAN / ED COURSE  I reviewed the triage vital signs and the nursing notes.  Differential diagnosis includes, but is not limited to, light sensitivity in the setting of recent dilation, exam not consistent with acute angle-closure glaucoma given normal intraocular pressure, no focal deficits suggestive of acute stroke, consideration for complex migraine  Patient's presentation is most consistent with acute presentation with potential threat to life or bodily function.  82 year old female presenting with bilateral eye pain after recent eye dilation.  Visual acuity slightly impaired, possibly baseline per patient's report.  Head CT reassuring.  Normal intraocular pressures.  Discussed headache  cocktail, but patient requested only Tylenol.  Did report improvement in her headache following this.  Overall lower suspicion for significant emergent pathology.  Do think she is stable for discharge with close follow-up with her ophthalmologist.  Patient comfortable this plan.  Strict return precautions provided.  Patient discharged stable condition.      FINAL CLINICAL IMPRESSION(S) / ED DIAGNOSES   Final diagnoses:  Pain of both eyes     Rx / DC Orders   ED Discharge Orders     None        Note:  This document was prepared using Dragon voice recognition software and may include unintentional dictation errors.   Trinna Post, MD 10/24/22 2231

## 2022-10-25 ENCOUNTER — Telehealth: Payer: Self-pay | Admitting: Family Medicine

## 2022-10-25 NOTE — Telephone Encounter (Signed)
Patient is calling and would like for Dr. Miquel Dunn to call her. She said it is concerning her eye issues and having to go to ED last night.   The best call back is [phone].

## 2022-10-25 NOTE — Telephone Encounter (Signed)
Called and discussed with patient- she was seen at ED last night and recommended f/u with eye doctor today. She has called her eye doctor and they are able to see her next Tuesday in [place] but more emergently in [place]. I discussed if continued symptoms I would recommend seeing eye doctor in [facility] today. She was appreciative of call.  [name] MD

## 2022-10-30 ENCOUNTER — Encounter: Payer: Medicare Other | Admitting: Cardiology

## 2022-10-30 NOTE — Telephone Encounter (Signed)
Faxed refills to [facility].

## 2022-11-05 ENCOUNTER — Ambulatory Visit: Payer: Medicare Other

## 2022-11-05 DIAGNOSIS — I480 Paroxysmal atrial fibrillation: Secondary | ICD-10-CM | POA: Diagnosis not present

## 2022-11-05 LAB — CUP PACEART REMOTE DEVICE CHECK
Date Time Interrogation Session: 20240906230542
Implantable Pulse Generator Implant Date: 20220525

## 2022-11-09 ENCOUNTER — Telehealth: Payer: Self-pay

## 2022-11-09 NOTE — Telephone Encounter (Signed)
Mailing BMS application to pt's home to attempt assistance for Eliquis medication.

## 2022-11-20 NOTE — Telephone Encounter (Signed)
Informed patient her novo nordisk shipment is ready for pickup.  4 boxes of ozempic 1mg  dose pens are labeled and ready in med room fridge.

## 2022-11-22 NOTE — Progress Notes (Signed)
[facility] Summary Report / Loop Recorder 

## 2022-11-28 ENCOUNTER — Other Ambulatory Visit: Payer: Self-pay

## 2022-11-29 MED ORDER — ALLOPURINOL 100 MG PO TABS: 100.0000 mg | ORAL_TABLET | Freq: Every day | ORAL | 0 refills | Status: DC

## 2022-12-05 ENCOUNTER — Ambulatory Visit: Payer: Medicare Other | Admitting: Cardiology

## 2022-12-10 ENCOUNTER — Ambulatory Visit (INDEPENDENT_AMBULATORY_CARE_PROVIDER_SITE_OTHER): Payer: Medicare Other

## 2022-12-10 DIAGNOSIS — I480 Paroxysmal atrial fibrillation: Secondary | ICD-10-CM | POA: Diagnosis not present

## 2022-12-10 LAB — CUP PACEART REMOTE DEVICE CHECK
Date Time Interrogation Session: 20241013230301
Implantable Pulse Generator Implant Date: 20220525

## 2022-12-11 NOTE — Progress Notes (Unsigned)
    SUBJECTIVE:   CHIEF COMPLAINT / HPI:   T2DM- urine due today. Previously discussed SGLT2 inhibitor for kidney protection, on metformin 500mg  once daily and GLP 1mg  weekly . Last A1c on 10/23/22 was 6.8. For past four weeks has been checking BG more, had one that was fasting 211, this morning was 141. NO signs of hypoglycemia. Notes she wasn't previously checking her sugars as much. She is still not sure about the SGLT2 inhibitor.  Hypothyroidism- always cold, tolerating medication well. Needs refills. Had recent TSH  HTN- not checking at home. Taking diltiazem and metoprolol as described. NO symptoms of orthostasis, chest pain or shortnes of breath.   PERTINENT  PMH / PSH: T2DM, HTN with orthostasis, history of CVA, HLD, HFpEF  OBJECTIVE:   BP (!) 142/84   Pulse 77   Wt 159 lb (72.1 kg)   SpO2 100%   BMI 26.46 kg/m   General: A&O, NAD HEENT: No sign of trauma, EOM grossly intact Cardiac: RRR, no m/r/g Respiratory: CTAB, normal WOB, no w/c/r GI: Soft, NTTP, non-distended  Extremities: NTTP Neuro: Normal gait, moves all four extremities appropriately. Psych: Appropriate mood and affect   ASSESSMENT/PLAN:   Assessment & Plan Type 2 diabetes mellitus with diabetic neuropathy, without long-term current use of insulin (HCC) Noticing higher sugars at home, denies hypoglycemia. Discussed addition of SGLT2 inhibitor, she prefers to check her BG more regularly for next month and let me know if consistently fasting >200, she is going to work on diet Continue metformin 500mg  once daily Hypothyroidism, unspecified type Recent TSH wnl, refilled, tolerating without side effects Encounter for immunization Flu vaccine given HYPERTENSION, BENIGN SYSTEMIC Repeat BP improved, will leave medications as previously she had severe orthostasis and was overtreated and is essentially at goal    Billey Co, MD [facility]

## 2022-12-12 ENCOUNTER — Ambulatory Visit (INDEPENDENT_AMBULATORY_CARE_PROVIDER_SITE_OTHER): Payer: Medicare Other | Admitting: Family Medicine

## 2022-12-12 VITALS — BP 142/84 | HR 77 | Wt 159.0 lb

## 2022-12-12 DIAGNOSIS — Z23 Encounter for immunization: Secondary | ICD-10-CM

## 2022-12-12 DIAGNOSIS — E039 Hypothyroidism, unspecified: Secondary | ICD-10-CM

## 2022-12-12 DIAGNOSIS — Z7985 Long-term (current) use of injectable non-insulin antidiabetic drugs: Secondary | ICD-10-CM | POA: Diagnosis not present

## 2022-12-12 DIAGNOSIS — I10 Essential (primary) hypertension: Secondary | ICD-10-CM

## 2022-12-12 DIAGNOSIS — E1140 Type 2 diabetes mellitus with diabetic neuropathy, unspecified: Secondary | ICD-10-CM

## 2022-12-12 MED ORDER — LEVOTHYROXINE SODIUM 112 MCG PO TABS
112.0000 ug | ORAL_TABLET | Freq: Every day | ORAL | 3 refills | Status: DC
Start: 2022-12-12 — End: 2023-10-15

## 2022-12-12 MED ORDER — METFORMIN HCL ER 500 MG PO TB24
500.0000 mg | ORAL_TABLET | Freq: Two times a day (BID) | ORAL | 1 refills | Status: DC
Start: 2022-12-12 — End: 2022-12-12

## 2022-12-12 MED ORDER — METFORMIN HCL ER 500 MG PO TB24
500.0000 mg | ORAL_TABLET | Freq: Every day | ORAL | 1 refills | Status: DC
Start: 2022-12-12 — End: 2023-05-29

## 2022-12-12 MED ORDER — LEVOTHYROXINE SODIUM 112 MCG PO TABS
112.0000 ug | ORAL_TABLET | Freq: Every day | ORAL | 3 refills | Status: DC
Start: 2022-12-12 — End: 2022-12-12

## 2022-12-12 NOTE — Assessment & Plan Note (Signed)
>>  ASSESSMENT AND PLAN FOR TYPE 2 DIABETES MELLITUS WITH DIABETIC NEUROPATHY, UNSPECIFIED (HCC) WRITTEN ON 12/12/2022  4:19 PM BY PRAY, MARGARET E, MD  Noticing higher sugars at home, denies hypoglycemia. Discussed addition of SGLT2 inhibitor, she prefers to check her BG more regularly for next month and let me know if consistently fasting >200, she is going to work on diet Continue metformin 500mg  once daily

## 2022-12-12 NOTE — Patient Instructions (Addendum)
It was wonderful to see you today.  Please bring ALL of your medications with you to every visit.   Today we talked about:  For your metformin, lets keep watching your blood sugars on metformin once a day. Please call and let me know if BG > 200 often  Your repeat BP looked good!  Thank you for choosing [facility].   Please call [phone] with any questions about today's appointment.  Please arrive at least 15 minutes prior to your scheduled appointments.   If you had blood work today, I will send you a MyChart message or a letter if results are normal. Otherwise, I will give you a call.   If you had a referral placed, they will call you to set up an appointment. Please give [name] a call if you don't hear back in the next 2 weeks.   If you need additional refills before your next appointment, please call your pharmacy first.   [name], MD  Family Medicine

## 2022-12-12 NOTE — Assessment & Plan Note (Signed)
Recent TSH wnl, refilled, tolerating without side effects

## 2022-12-12 NOTE — Assessment & Plan Note (Addendum)
Noticing higher sugars at home, denies hypoglycemia. Discussed addition of SGLT2 inhibitor, she prefers to check her BG more regularly for next month and let me know if consistently fasting >200, she is going to work on diet Continue metformin 500mg  once daily

## 2022-12-12 NOTE — Assessment & Plan Note (Signed)
Repeat BP improved, will leave medications as previously she had severe orthostasis and was overtreated and is essentially at goal

## 2022-12-18 NOTE — Telephone Encounter (Signed)
Rec'd completed patient portion.  Will still need 2024 out of pocket medication expense sheet from pharmacy. Pt aware.  Application in pcp box for signature.

## 2022-12-24 NOTE — Telephone Encounter (Signed)
Patient presents to clinic to pick up medication.   Provided with medication per note from [place].   Veronda Prude, RN

## 2022-12-27 NOTE — Progress Notes (Signed)
[facility] Summary Report / Loop Recorder 

## 2022-12-28 ENCOUNTER — Telehealth: Payer: Self-pay

## 2022-12-28 NOTE — Telephone Encounter (Signed)
Patient calls nurse line in regards to continued eye problem.  She reports continued itching in both eyes and she reports the symptoms have now "traveled" to her cheeks, mouth and scalp.   She reports a "light brownish" discharge coming from both eyes. She reports "incredible deep" itching on infected areas.   She reports vision distortion in her right eye. She reports she see things "crooked." She denies any vision loss.  She denies any fevers or chills.   She reports she discussed this with PCP at last visit in October. She rpeorts she has seen her Retina Specialist and her Eye Specialist numerous times for condition, however no resolution.   Advised will forward to PCP.   Precautions discussed with patient in the meantime.

## 2022-12-31 NOTE — Telephone Encounter (Signed)
Patient scheduled with [facility] for tomorrow in [place].   Patient requests an apt with [facility] to discuss medication management. She reports medication changes and wants him to be aware.   Patient scheduled with Koval for 11/8.

## 2023-01-01 ENCOUNTER — Ambulatory Visit (INDEPENDENT_AMBULATORY_CARE_PROVIDER_SITE_OTHER): Payer: Medicare Other | Admitting: Student

## 2023-01-01 ENCOUNTER — Other Ambulatory Visit: Payer: Self-pay

## 2023-01-01 VITALS — BP 140/64 | HR 66 | Ht 65.0 in | Wt 158.4 lb

## 2023-01-01 DIAGNOSIS — H579 Unspecified disorder of eye and adnexa: Secondary | ICD-10-CM

## 2023-01-01 MED ORDER — AZELASTINE HCL 0.1 % NA SOLN
2.0000 | Freq: Two times a day (BID) | NASAL | 12 refills | Status: DC
Start: 2023-01-01 — End: 2023-03-22

## 2023-01-01 MED ORDER — FLUTICASONE PROPIONATE 50 MCG/ACT NA SUSP
2.0000 | Freq: Every day | NASAL | 6 refills | Status: DC
Start: 2023-01-01 — End: 2023-02-15

## 2023-01-01 NOTE — Patient Instructions (Addendum)
I want you to go back to taking the Zyrtec every day.    I think this problem may be best handled by your general ophthalmology team (Dr. Margaretmary Eddy office). I worry when we get too many "cooks in the kitchen." I want you to be sure that you tell the ophthalmologist's office about the [facility] and your interest in this. I also want you to ask them if you need to be using the prednisone eye drops or not. I do not think you're getting any benefit from these and they can be detrimental in the long run.   In the meantime, let's try introducing two nasal sprays: Flonase and Azelastine. Use these twice daily and see if this helps with the itchiness and the discharge.   Eliezer Mccoy, MD

## 2023-01-01 NOTE — Progress Notes (Unsigned)
SUBJECTIVE:   CHIEF COMPLAINT / HPI:   Itchy Eyes  Patient is here today to discuss itchy eyes which have been a longstanding issue for her. She occasionally also gets crusting of the bilateral eyelids/lashes.  She is frustrated at having seen multiple providers both here in our office and her ophthalmologist and retinal specialist offices.  She has tried topical Pataday drops, multiple different lubricating eyedrops, and most recently has been using topical steroid eyedrops for ?iridocyclitis prescribed by Dr. Sherryll Burger.  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 Based on concerns for persistent blurring of vision and light sensitivity, Dr. Clelia Croft had ordered an MRI brain and orbits face and/or neck with and without contrast 10/30/2022.  This is not yet been done. Of note, when recounting her recent history, she notes consulting at least three ophthalmologists Sherryll Burger, [place], and [place]) in addition to our office for this same issue.  By chart review has also seen Drs. Francoise Ceo, and Cobb recently.   When asked about other potential allergic symptoms, she does endorse some itchiness of her scalp in addition to some runny nose and congestion.  She has in the past taken Zyrtec as needed but has never tried regular use.  She was supposed to have follow-up with Dr. Sherryll Burger today, but tells me that she canceled that visit in favor of coming to see [name].  OBJECTIVE:   BP (!) 140/64   Pulse 66   Ht 5\' 5"  (1.651 m)   Wt 158 lb 6.4 oz (71.8 kg)   SpO2 100%   BMI 26.36 kg/m   Gen: Appears younger than stated age, NAD Eyes: Bilateral conjunctivae are clear. There is no discharge or crusting about the eyes. No excess tearing. Attempted fundoscopic exam but she did not tolerate 2/2 light sensitivity. Nose: Nasal turbinates are pale  and boggy, mild rhinorrhea noted on exam Oropharyngeal: oropharynx is clear, without exudate or tonsillar hypertrophy Neck: Supple and without LAD Skin: Without rash or excoriation  ASSESSMENT/PLAN:   Itchy eyes Longstanding issue.  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 Benign exam today but remains with significant itchiness.  I question whether this may be histamine mediated given her concomitant itchy scalp and runny nose, therefore we will treat this as though it is allergic in nature.  However, given her complex ophthalmologic history, she certainly needs to be following up with her ophthalmologist regularly.  I also worry about this being a case of having "too many cooks in the kitchen" with her seeing so many ophthalmologist in addition to our office for the same issue.  I have advised her to identify a primary ophthalmologist and try to schedule all of her follow-up with the same physician for continuity of care and better problem-solving along this longitudinal complex issue.  She identifies Dr. Valere Dross as this person and plans to follow-up with him ASAP. - Daily Zyrtec - Introducing BID Flonase and Azelastine nasal sprays  - Follow-up with Dr. Valere Dross ASAP, need to discuss with him the appropriateness of ongoing topical steroid use. I fear this may lead to more problems down the line. 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

## 2023-01-02 DIAGNOSIS — H579 Unspecified disorder of eye and adnexa: Secondary | ICD-10-CM | POA: Insufficient documentation

## 2023-01-02 NOTE — Assessment & Plan Note (Addendum)
Longstanding issue.  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 Benign exam today but remains with significant itchiness.  I question whether this may be histamine mediated given her concomitant itchy scalp and runny nose, therefore we will treat this as though it is allergic in nature.  However, given her complex ophthalmologic history, she certainly needs to be following up with her ophthalmologist regularly.  I also worry about this being a case of having "too many cooks in the kitchen" with her seeing so many ophthalmologist in addition to our office for the same issue.  I have advised her to identify a primary ophthalmologist and try to schedule all of her follow-up with the same physician for continuity of care and better problem-solving along this longitudinal complex issue.  She identifies Dr. Valere Dross as this person and plans to follow-up with him ASAP. - Daily Zyrtec - Introducing BID Flonase and Azelastine nasal sprays  - Follow-up with Dr. Valere Dross ASAP, need to discuss with him the appropriateness of ongoing topical steroid use. I fear this may lead to more problems down the line. 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

## 2023-01-04 ENCOUNTER — Ambulatory Visit: Payer: Medicare Other | Admitting: Pharmacist

## 2023-01-12 ENCOUNTER — Emergency Department
Admission: EM | Admit: 2023-01-12 | Discharge: 2023-01-12 | Disposition: A | Discharge status: Home / Self Care | Payer: Medicare Other | Attending: Student in an Organized Health Care Education/Training Program | Admitting: Student in an Organized Health Care Education/Training Program

## 2023-01-12 ENCOUNTER — Other Ambulatory Visit: Payer: Self-pay

## 2023-01-12 DIAGNOSIS — R220 Localized swelling, mass and lump, head: Secondary | ICD-10-CM | POA: Diagnosis present

## 2023-01-12 DIAGNOSIS — L299 Pruritus, unspecified: Secondary | ICD-10-CM | POA: Insufficient documentation

## 2023-01-12 LAB — CBC WITH DIFFERENTIAL/PLATELET
Abs Immature Granulocytes: 0.01 10*3/uL (ref 0.00–0.07)
Basophils Absolute: 0.1 10*3/uL (ref 0.0–0.1)
Basophils Relative: 1 %
Eosinophils Absolute: 0.2 10*3/uL (ref 0.0–0.5)
Eosinophils Relative: 5 %
HCT: 30.5 % — ABNORMAL LOW (ref 36.0–46.0)
Hemoglobin: 9.5 g/dL — ABNORMAL LOW (ref 12.0–15.0)
Immature Granulocytes: 0 %
Lymphocytes Relative: 24 %
Lymphs Abs: 1.1 10*3/uL (ref 0.7–4.0)
MCH: 29.2 pg (ref 26.0–34.0)
MCHC: 31.1 g/dL (ref 30.0–36.0)
MCV: 93.8 fL (ref 80.0–100.0)
Monocytes Absolute: 0.4 10*3/uL (ref 0.1–1.0)
Monocytes Relative: 8 %
Neutro Abs: 2.9 10*3/uL (ref 1.7–7.7)
Neutrophils Relative %: 62 %
Platelets: 199 10*3/uL (ref 150–400)
RBC: 3.25 MIL/uL — ABNORMAL LOW (ref 3.87–5.11)
RDW: 14.9 % (ref 11.5–15.5)
WBC: 4.7 10*3/uL (ref 4.0–10.5)
nRBC: 0 % (ref 0.0–0.2)

## 2023-01-12 LAB — COMPREHENSIVE METABOLIC PANEL
ALT: 17 U/L (ref 0–44)
AST: 19 U/L (ref 15–41)
Albumin: 3.8 g/dL (ref 3.5–5.0)
Alkaline Phosphatase: 54 U/L (ref 38–126)
Anion gap: 7 (ref 5–15)
BUN: 25 mg/dL — ABNORMAL HIGH (ref 8–23)
CO2: 26 mmol/L (ref 22–32)
Calcium: 9.4 mg/dL (ref 8.9–10.3)
Chloride: 106 mmol/L (ref 98–111)
Creatinine, Ser: 0.94 mg/dL (ref 0.44–1.00)
GFR, Estimated: >60 mL/min (ref >60)
Glucose, Bld: 104 mg/dL — ABNORMAL HIGH (ref 70–99)
Potassium: 4 mmol/L (ref 3.5–5.1)
Sodium: 139 mmol/L (ref 135–145)
Total Bilirubin: 0.8 mg/dL (ref <1.2)
Total Protein: 6.6 g/dL (ref 6.5–8.1)

## 2023-01-12 MED ORDER — HYDROXYZINE HCL 10 MG PO TABS
10.0000 mg | ORAL_TABLET | Freq: Once | ORAL | Status: AC
  Administered 2023-01-12: 10 mg via ORAL
  Filled 2023-01-12: qty 1

## 2023-01-12 MED ORDER — HYDROXYZINE HCL 10 MG PO TABS: 10.0000 mg | ORAL_TABLET | Freq: Three times a day (TID) | ORAL | 0 refills | Status: DC | PRN

## 2023-01-12 MED ORDER — PREDNISONE 20 MG PO TABS: 20.0000 mg | ORAL_TABLET | Freq: Every day | ORAL | 0 refills | Status: AC

## 2023-01-12 NOTE — ED Triage Notes (Addendum)
Pt c/o facial itchy, skin dryness, and swelling. Right eye is red- pt states she's being seen for a retina issue. Pt states s/s have been going on for 2 months and has gotten worse. No redness/rash noted, mild swelling noted on face. Respirations even and unlabored. Pt takes metoprolol.

## 2023-01-14 ENCOUNTER — Ambulatory Visit (INDEPENDENT_AMBULATORY_CARE_PROVIDER_SITE_OTHER): Payer: Medicare Other

## 2023-01-14 VITALS — BP 172/88 | HR 71 | Ht 65.0 in | Wt 157.6 lb

## 2023-01-14 DIAGNOSIS — L299 Pruritus, unspecified: Secondary | ICD-10-CM | POA: Diagnosis not present

## 2023-01-14 DIAGNOSIS — I480 Paroxysmal atrial fibrillation: Secondary | ICD-10-CM

## 2023-01-14 LAB — CUP PACEART REMOTE DEVICE CHECK
Date Time Interrogation Session: 20241117231009
Implantable Pulse Generator Implant Date: 20220525

## 2023-01-14 NOTE — Patient Instructions (Signed)
It was wonderful to see you today.  Please bring ALL of your medications with you to every visit.   Today we talked about:  Itching - I believe this is all coming from the inflammation in your eyes. Please take the 5 day course of prednisone. Let us know if you have any agitation symptoms. Please stop taking the hydroxyzine/atarax as it did not help and has more risk than benefit at this point.   For your dry skin use a moisturizer without any fragrance and then top it with vaseline or baby oil. For your scalp even if just using water, use a conditioner after to avoid drying out the skin.    Thank you for choosing [facility].   Please call [phone] with any questions about today's appointment.   Lockie Mola, MD  Family Medicine

## 2023-01-14 NOTE — Progress Notes (Cosign Needed Addendum)
    SUBJECTIVE:   CHIEF COMPLAINT / HPI:   Pruritus  Patient reports itching of face, eyes, scalp. Feels like the itchiness is moving all over her body. Was recently in the ED for the same symptoms two days ago .Labs only indicated anemia of 9.5 similar to prior. Elevated Bun of 25 similar to prior  She was prescribed hydroxyzine and prednisone 20 mg.  Did not start any new meications recently.    PERTINENT  PMH / PSH: HTN, CAD, PAF, H/o CVA, Anemia s/t GI loss, chronic gout with tophus, H/o cholecystectomy   OBJECTIVE:   Ht 5\' 5"  (1.651 m)   Wt 157 lb 9.6 oz (71.5 kg)   BMI 26.23 kg/m   General: well appearing, in no acute distress HEENT: right eye with some erythema in conjunctiva, left with milder erythema, no discharge on exam, no abnormality of the skin of the face or scalp.  CV: RRR, radial pulses equal and palpable, no BLE edema  Resp: Normal work of breathing on room air, CTAB Abd: Soft, non tender, non distended  Neuro: Alert & Oriented x 4    ASSESSMENT/PLAN:   Assessment & Plan Pruritus Pruritus of face and scalp most likely referred from the eyes. 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 Unlikely allergic component as patient has tried zyrtec and atarax and failed. Could be exacerbated by dry skin and cold weather, but eyes continue to be a major component. Patient can start the prednisone that was prescribed in the ED despite allergic reaction listed as patient had agitation and abnormal dreams; however, that was while patient had been admitted to the hospital and could have had concomitant delirium in the hospital. Will attempt prednisone as patient has failed many other treatments. Unlikely uric acid related as patient has had cholecystectomy and only has pruritus in localized area.  - Prednisone 20 mg for 5 days  - Stop atarax  - Follow up with Dr. Miquel Dunn in 2 weeks.      Lockie Mola, MD [facility]

## 2023-01-15 ENCOUNTER — Encounter: Payer: Medicare Other | Admitting: Cardiology

## 2023-01-15 NOTE — Progress Notes (Deleted)
   Electrophysiology Office Note:   Date:  01/15/2023  ID:  LUANDA KIRSCHENMAN, DOB 1941/02/24, MRN [id number]  Primary Cardiologist: Charlton Haws, MD Electrophysiologist: Jaksen Fiorella Jorja Loa, MD  {Click to update primary MD,subspecialty MD or APP then REFRESH:1}    History of Present Illness:   Courtney Pena is a 82 y.o. female with h/o diabetes, hypertension, hyperlipidemia, obesity, atrial fibrillation seen today for routine electrophysiology followup.   Since last being seen in our clinic the patient reports doing ***.  she denies chest pain, palpitations, dyspnea, PND, orthopnea, nausea, vomiting, dizziness, syncope, edema, weight gain, or early satiety.   Review of systems complete and found to be negative unless listed in HPI.   Device History: Medtronic loop recorder implanted *** for Atrial fibrillation  EP Information / Studies Reviewed:    {EKGtoday:28818}        Risk Assessment/Calculations:    CHA2DS2-VASc Score = 6  {Confirm score is correct.  If not, click here to update score.  REFRESH note.  :1} This indicates a 9.7% annual risk of stroke. The patient's score is based upon: CHF History: 0 HTN History: 1 Diabetes History: 1 Stroke History: 0 Vascular Disease History: 1 Age Score: 2 Gender Score: 1   {This patient has a significant risk of stroke if diagnosed with atrial fibrillation.  Please consider VKA or DOAC agent for anticoagulation if the bleeding risk is acceptable.   You can also use the SmartPhrase .[email or web address] for documentation.   :[id number]}          Physical Exam:   VS:  There were no vitals taken for this visit.   Wt Readings from Last 3 Encounters:  01/14/23 157 lb 9.6 oz (71.5 kg)  01/01/23 158 lb 6.4 oz (71.8 kg)  12/12/22 159 lb (72.1 kg)     GEN: Well nourished, well developed in no acute distress NECK: No JVD; No carotid bruits CARDIAC: {EPRHYTHM:28826}, no murmurs, rubs, gallops RESPIRATORY:  Clear to  auscultation without rales, wheezing or rhonchi  ABDOMEN: Soft, non-tender, non-distended EXTREMITIES:  No edema; No deformity   ILR Interrogation- reviewed in detail today,  See PACEART report  ASSESSMENT AND PLAN:    Atrial fibrillation s/p Medtronic Loop recorder Normal device function See Pace Art report No changes today  2.  Secondary hypercoagulable state: Currently on Eliquis for atrial fibrillation  3.  Hypertension:***  4.  Obstructive sleep apnea: CPAP compliance encouraged  {Click here to Review PMH, Prob List, Meds, Allergies, SHx, FHx  :1}   Follow up with {TFTDD:22025} {EPFOLLOW [place]:70623}  Signed, Ozias Dicenzo Jorja Loa, MD

## 2023-01-17 ENCOUNTER — Other Ambulatory Visit: Payer: Self-pay

## 2023-01-17 ENCOUNTER — Telehealth: Payer: Self-pay

## 2023-01-17 DIAGNOSIS — D500 Iron deficiency anemia secondary to blood loss (chronic): Secondary | ICD-10-CM

## 2023-01-17 DIAGNOSIS — E7800 Pure hypercholesterolemia, unspecified: Secondary | ICD-10-CM

## 2023-01-17 MED ORDER — OMEPRAZOLE 40 MG PO CPDR
40.0000 mg | DELAYED_RELEASE_CAPSULE | Freq: Every day | ORAL | 3 refills | Status: DC
Start: 2023-01-17 — End: 2023-05-29

## 2023-01-17 MED ORDER — ROSUVASTATIN CALCIUM 10 MG PO TABS
10.0000 mg | ORAL_TABLET | Freq: Every day | ORAL | 3 refills | Status: DC
Start: 2023-01-17 — End: 2023-05-29

## 2023-01-17 NOTE — Telephone Encounter (Signed)
Received a Medical Clarification Request form from [facility] about medication Glucophage tab 500mg   Placed in PCP's box   Drusilla Kanner, CMA

## 2023-01-17 NOTE — Telephone Encounter (Deleted)
Received a Medical Clarification Request from [facility] about medication Glucophage XR tab 500mg   Placed in PCP's box

## 2023-01-18 NOTE — ED Provider Notes (Signed)
[facility] Provider Note    Event Date/Time   First MD Initiated Contact with Patient 01/12/23 1402     (approximate)   History   Facial Swelling   HPI  Courtney Pena is a 82 y.o. female presents the ER for evaluation of generalized itching particular the face of dry eyes.  Followed with ophthalmology for this.  No fevers or chills.  Has tried some Zyrtec without improvement.  Has listed allergy to prednisone and prefer to avoid this if at all possible at this time.  Denies any abdominal pain nausea or vomiting.     Physical Exam   Triage Vital Signs: ED Triage Vitals  Encounter Vitals Group     BP 01/12/23 1353 (!) 179/92     Systolic BP Percentile --      Diastolic BP Percentile --      Pulse Rate 01/12/23 1353 70     Resp 01/12/23 1353 16     Temp 01/12/23 1353 98.4 F (36.9 C)     Temp Source 01/12/23 1353 Oral     SpO2 01/12/23 1353 98 %     Weight --      Height --      Head Circumference --      Peak Flow --      Pain Score 01/12/23 1354 0     Pain Loc --      Pain Education --      Exclude from Growth Chart --     Most recent vital signs: Vitals:   01/12/23 1353  BP: (!) 179/92  Pulse: 70  Resp: 16  Temp: 98.4 F (36.9 C)  SpO2: 98%     Constitutional: Alert  Eyes: Conjunctivae are normal.  Head: Atraumatic. Nose: No congestion/rhinnorhea. Mouth/Throat: Mucous membranes are moist.   Neck: Painless ROM.  Cardiovascular:   Good peripheral circulation. Respiratory: Normal respiratory effort.  No retractions.  Gastrointestinal: Soft and nontender.  Musculoskeletal:  no deformity Neurologic:  MAE spontaneously. No gross focal neurologic deficits are appreciated.  Skin:  Skin is warm, dry and intact. No rash noted. Psychiatric: Mood and affect are normal. Speech and behavior are normal.    ED Results / Procedures / Treatments   Labs (all labs ordered are listed, but only abnormal results are  displayed) Labs Reviewed  CBC WITH DIFFERENTIAL/PLATELET - Abnormal; Notable for the following components:      Result Value   RBC 3.25 (*)    Hemoglobin 9.5 (*)    HCT 30.5 (*)    All other components within normal limits  COMPREHENSIVE METABOLIC PANEL - Abnormal; Notable for the following components:   Glucose, Bld 104 (*)    BUN 25 (*)    All other components within normal limits     EKG     RADIOLOGY    PROCEDURES:  Critical Care performed:   Procedures   MEDICATIONS ORDERED IN ED: Medications  hydrOXYzine (ATARAX) tablet 10 mg (10 mg Oral Given 01/12/23 1528)     IMPRESSION / MDM / ASSESSMENT AND PLAN / ED COURSE  I reviewed the triage vital signs and the nursing notes.                              Differential diagnosis includes, but is not limited to, pruritus, urticaria, allergic reaction, cellulitis, dermatitis  Patient presenting to the ER for evaluation of symptoms as described above.  Based on symptoms, risk factors and considered above differential, this presenting complaint could reflect a potentially life-threatening illness therefore the patient will be placed on continuous pulse oximetry and telemetry for monitoring.  Laboratory evaluation will be sent to evaluate for the above complaints.  Clinically very well-appearing.  Blood work is reassuring.  Bilirubin normal.  No leukocytosis no fever not consistent with infectious process.  Did have some mild improvement with Atarax given here in the ER.  Does have close outpatient follow-up.  Appropriate for outpatient management.       FINAL CLINICAL IMPRESSION(S) / ED DIAGNOSES   Final diagnoses:  Itching     Rx / DC Orders   ED Discharge Orders          Ordered    predniSONE (DELTASONE) 20 MG tablet  Daily        01/12/23 1604    hydrOXYzine (ATARAX) 10 MG tablet  Every 8 hours PRN        01/12/23 1604             Note:  This document was prepared using Dragon voice recognition  software and may include unintentional dictation errors.    Willy Eddy, MD 01/18/23 [phone]

## 2023-01-28 ENCOUNTER — Ambulatory Visit: Payer: Medicare Other | Admitting: Family Medicine

## 2023-02-08 NOTE — Progress Notes (Signed)
[facility] Summary Report / Loop Recorder 

## 2023-02-11 ENCOUNTER — Encounter: Payer: Self-pay | Admitting: Family Medicine

## 2023-02-11 ENCOUNTER — Ambulatory Visit: Payer: Medicare Other | Admitting: Family Medicine

## 2023-02-11 VITALS — BP 172/92 | HR 73 | Ht 65.0 in | Wt 161.4 lb

## 2023-02-11 DIAGNOSIS — I10 Essential (primary) hypertension: Secondary | ICD-10-CM

## 2023-02-11 DIAGNOSIS — E1140 Type 2 diabetes mellitus with diabetic neuropathy, unspecified: Secondary | ICD-10-CM | POA: Diagnosis not present

## 2023-02-11 DIAGNOSIS — R350 Frequency of micturition: Secondary | ICD-10-CM | POA: Diagnosis not present

## 2023-02-11 LAB — POCT GLYCOSYLATED HEMOGLOBIN (HGB A1C): HbA1c, POC (controlled diabetic range): 7 % (ref 0.0–7.0)

## 2023-02-11 LAB — POCT UA - MICROSCOPIC ONLY

## 2023-02-11 LAB — POCT URINALYSIS DIP (MANUAL ENTRY)
Bilirubin, UA: NEGATIVE
Glucose, UA: NEGATIVE mg/dL
Ketones, POC UA: NEGATIVE mg/dL
Nitrite, UA: NEGATIVE
Protein Ur, POC: NEGATIVE mg/dL
Spec Grav, UA: 1.025 (ref 1.010–1.025)
Urobilinogen, UA: 0.2 U/dL
pH, UA: 6 (ref 5.0–8.0)

## 2023-02-11 MED ORDER — CARVEDILOL 12.5 MG PO TABS: 12.5000 mg | ORAL_TABLET | Freq: Two times a day (BID) | ORAL | 0 refills | Status: DC

## 2023-02-11 NOTE — Assessment & Plan Note (Signed)
Not orthostatic today, and not at goal, neurologically intact Discussed with patient and will switch back to lower dose Coreg 12.5 mg BID (was previously on 25 mg BID) and STOP metoprolol, will have her monitor home BP and discussed signs/symptoms of orthostasis to watch for F/u with Dr Raymondo Band later this week, can cancel appt if BP well controlled

## 2023-02-11 NOTE — Progress Notes (Signed)
    SUBJECTIVE:   CHIEF COMPLAINT / HPI:   T2DM- on metformin 500mg  once daily and GLP 1 mg once weekly. Last A1c 6.8. Today was 7.0. Discussed other medications including for kidney protection and she does not want to start new medications at this time.   Increased urinary frequency - has been drinking more water recently. Denies fevers, chills, dysuria, flank pain, blood in urine, or odor. Denies excess caffeine use. NO changes in medications recently, Been for about 3 weeks.   HTN- takes metoprolol 50mg  daily, diltiazem 360mg  daily. When she checks at home her BP runs 170s. No orthostasis or dizzy symptoms. Denies HA.  PERTINENT  PMH / PSH: T2DM, HTN with orthostasis, history of CVA, HLD, HFpEF   OBJECTIVE:   BP (!) 172/92   Pulse 73   Ht 5\' 5"  (1.651 m)   Wt 161 lb 6.4 oz (73.2 kg)   SpO2 99%   BMI 26.86 kg/m   General: A&O, NAD HEENT: No sign of trauma, PERRL, EOMI Cardiac: RRR, no m/r/g Respiratory: CTAB, normal WOB, no w/c/r GI: Soft, NTTP, non-distended , no CVA tenderness Extremities: NTTP, no peripheral edema. Neuro: Memory: Intact . PEERLA. Cranial nerves: II through XII are intact.  Sensation normal upper and lower ext's bilaterally. Strength 5/5 in upper and lower ext's bilaterally.  Gait normal Psych: Appropriate mood and affect  Orthostatic vitals: Bp sitting 172/92 BP standing at 3 min 172/86 ASSESSMENT/PLAN:   Assessment & Plan Type 2 diabetes mellitus with diabetic neuropathy, without long-term current use of insulin (HCC) A1c at goal, discussed SGLT2 which she does not want to start at this time, follows with nephrology for CKD Urinary frequency Urinalysis showed trace leukocytes, trace blood but 0-2 RBCs and trace bacteria, without other convincing symptoms for infection, will not treat and will send for culture Decrease PO intake esp in evenings before bed to avoid nocturia Essential hypertension, benign Not orthostatic today, and not at goal,  neurologically intact Discussed with patient and will switch back to lower dose Coreg 12.5 mg BID (was previously on 25 mg BID) and STOP metoprolol, will have her monitor home BP and discussed signs/symptoms of orthostasis to watch for F/u with Dr Raymondo Band later this week, can cancel appt if BP well controlled     Billey Co, MD [facility]

## 2023-02-11 NOTE — Assessment & Plan Note (Signed)
A1c at goal, discussed SGLT2 which she does not want to start at this time, follows with nephrology for CKD

## 2023-02-11 NOTE — Assessment & Plan Note (Signed)
>>  ASSESSMENT AND PLAN FOR TYPE 2 DIABETES MELLITUS WITH DIABETIC NEUROPATHY, UNSPECIFIED (HCC) WRITTEN ON 02/11/2023  4:24 PM BY PRAY, MARGARET E, MD  A1c at goal, discussed SGLT2 which she does not want to start at this time, follows with nephrology for CKD

## 2023-02-11 NOTE — Patient Instructions (Addendum)
It was wonderful to see you today.  Please bring ALL of your medications with you to every visit.   Today we talked about:  - We checked your urine and it showed just trace white blood cells- not convincing for infection ,I will send a culture and call you with results. If you have fevers, chills, flank pain, blood in urine, or other systemic symptoms please call ASAP  - Your blood pressure is not controlled. We will STOP your metoprolol We will start carvedilol Coreg 12.5 mg twice daily You can check your BP and if they are improved: SBP < 150 and DBP < 90, you can call and cancel your appt, otherwise Dr Raymondo Band will see you Friday 12/20 at 11:00 AM. If you feel dizzy, lightheaded, if you fall, please stop and call our clinic.  Your A1c is 7.0, you can continue your metformin daily.  Follow up with your dermatologist on Friday.  Please make a follow up with me in one month.    Thank you for choosing [facility].   Please call [phone] with any questions about today's appointment.  Please arrive at least 15 minutes prior to your scheduled appointments.   If you had blood work today, I will send you a MyChart message or a letter if results are normal. Otherwise, I will give you a call.   If you had a referral placed, they will call you to set up an appointment. Please give [name] a call if you don't hear back in the next 2 weeks.   If you need additional refills before your next appointment, please call your pharmacy first.   [name], MD  Family Medicine

## 2023-02-13 ENCOUNTER — Telehealth: Payer: Self-pay | Admitting: Family Medicine

## 2023-02-13 LAB — URINE CULTURE

## 2023-02-13 NOTE — Telephone Encounter (Signed)
Called and left VM discussing urine culture not showing abcteria. Recommended decreasing water especially in evening if urinating more frequently, and decreasing caffeine use to monitor for symptoms.  Discussed if fevers, chills, flank pain, blood in urine, dysuria, or other new symptoms to call clinic back for an appointment.  [name] MD

## 2023-02-14 NOTE — Telephone Encounter (Signed)
Enrollment for 2024 ended 02/08/23.

## 2023-02-15 ENCOUNTER — Ambulatory Visit: Payer: Medicare Other | Admitting: Pharmacist

## 2023-02-15 ENCOUNTER — Encounter: Payer: Self-pay | Admitting: Pharmacist

## 2023-02-15 VITALS — BP 154/77 | HR 68 | Wt 158.0 lb

## 2023-02-15 DIAGNOSIS — I10 Essential (primary) hypertension: Secondary | ICD-10-CM

## 2023-02-15 NOTE — Progress Notes (Signed)
Reviewed and agree with Dr Koval's plan.   

## 2023-02-15 NOTE — Progress Notes (Signed)
S:     Chief Complaint  Patient presents with   Medication Management    Hypertension   82 y.o. female who presents for hypertension evaluation, education, and management.  PMH is significant for diabetes, hypertension and recently itching with rash.   Patient was referred and last seen by Primary Care Provider, Dr. Miquel Dunn, on 02/11/2023.   At last visit, concerned about blood pressure and switched from metoprolol to carvedilol. .   Today, patient arrives in spirits and presents without assistance. Denies dizziness, headache, blurred vision, swelling.   Family/Social history:  Following some conversation, the patient currently has her brother living in her home while he has some work done on his house.  He has been living with her for ~ 3-4 weeks and hopefully will return to living in his home in about 7-10 days.  His presence in her space has created some level of anxiety.  Patient also reports receiving a steroid shot within the last 2-3 weeks.   Medication adherence good . Patient has taken BP (carvedilol) medications today.   Reported home BP readings: 130s to mid 150s systolic and 60s to 90s diastolic   O:  Review of Systems  Skin:  Positive for itching and rash.  Neurological:  Negative for dizziness and headaches.  Psychiatric/Behavioral:  The patient is nervous/anxious.     Physical Exam Vitals reviewed.  Constitutional:      Appearance: Normal appearance.  Pulmonary:     Effort: Pulmonary effort is normal.  Neurological:     Mental Status: She is alert. Mental status is at baseline.  Psychiatric:        Mood and Affect: Mood normal.        Behavior: Behavior normal.        Thought Content: Thought content normal.        Judgment: Judgment normal.     Last 3 Office BP readings: BP Readings from Last 3 Encounters:  02/15/23 (!) 154/77  02/11/23 (!) 172/92  01/14/23 (!) 172/88    BMET    Component Value Date/Time   NA 139 01/12/2023 1518   NA 136  08/29/2022 1040   K 4.0 01/12/2023 1518   CL 106 01/12/2023 1518   CO2 26 01/12/2023 1518   GLUCOSE 104 (H) 01/12/2023 1518   BUN 25 (H) 01/12/2023 1518   BUN 32 (H) 08/29/2022 1040   CREATININE 0.94 01/12/2023 1518   CREATININE 1.07 (H) 12/22/2015 1051   CALCIUM 9.4 01/12/2023 1518   GFRNONAA >60 01/12/2023 1518   GFRNONAA 51 (L) 12/22/2015 1051   GFRAA 57 (L) 03/21/2020 0936   GFRAA 59 (L) 12/22/2015 1051     A/P: Hypertension long-standing history, currently likely worsened control due to multiple factors including increased stress related to an ongoing rash which was treated with steroid injection.  Additionally, patient shared an increase in her anxiety/stress related to having her brother living in her home temporarily. (Moving out in the next 7-10 days).  Goal BP <140 mmHg systolic if possible and lower if tolerated.  Medication adherence appears. -Continued carvedilol 12.5mg  BID.  -Counseled on lifestyle modifications for blood pressure control including reduced dietary sodium, increased exercise, adequate sleep. -Encouraged patient to check BP at home and bring log of readings to next visit. Asked to check only 2-3 days per week to follow trend in blood pressure.   Results reviewed and written information provided.    Written patient instructions provided. Patient verbalized understanding of treatment plan.  Total  time in face to face counseling 23 minutes.    Follow-up: MId- January with either Dr. Miquel Dunn or myself if she continues to have systolic readings > 140 consistently.  No visit scheduled.  Pharmacist PRN. PCP clinic visit in PRN.

## 2023-02-15 NOTE — Patient Instructions (Signed)
It was nice to see you today!  Your goal blood pressure is 140 mmHg.  Best of luck with your brother.    Medication Changes: None today  Continue all other medication the same.   Follow with Dr. Miquel Dunn or back with me in Mid-late-January   If your blood pressure remains elevated.    Monitor blood pressure at home daily and keep a log (on your phone or piece of paper) to bring with you to your next visit. Write down date, time, blood pressure and pulse.  Keep up the good work with diet and exercise. Aim for a diet full of vegetables, fruit and lean meats (chicken, [place], fish). Try to limit salt intake by eating fresh or frozen vegetables (instead of canned), rinse canned vegetables prior to cooking and do not add any additional salt to meals.

## 2023-02-15 NOTE — Assessment & Plan Note (Signed)
Hypertension long-standing history, currently likely worsened control due to multiple factors including increased stress related to an ongoing rash which was treated with steroid injection.  Additionally, patient shared an increase in her anxiety/stress related to having her brother living in her home temporarily. (Moving out in the next 7-10 days).  Goal BP <140 mmHg systolic if possible and lower if tolerated.  Medication adherence appears. -Continued carvedilol 12.5mg  BID.

## 2023-02-18 ENCOUNTER — Ambulatory Visit (INDEPENDENT_AMBULATORY_CARE_PROVIDER_SITE_OTHER): Payer: Medicare Other

## 2023-02-18 DIAGNOSIS — I480 Paroxysmal atrial fibrillation: Secondary | ICD-10-CM | POA: Diagnosis not present

## 2023-02-19 LAB — CUP PACEART REMOTE DEVICE CHECK
Date Time Interrogation Session: 20241222230912
Implantable Pulse Generator Implant Date: 20220525

## 2023-02-25 ENCOUNTER — Telehealth: Payer: Self-pay

## 2023-02-25 ENCOUNTER — Other Ambulatory Visit (HOSPITAL_COMMUNITY): Payer: Self-pay

## 2023-02-25 NOTE — Progress Notes (Unsigned)
Pharmacy Medication Assistance Program Note    02/25/2023  Patient ID: Courtney Pena, female   DOB: Sep 21, 1940, 82 y.o.   MRN: [id number]     02/25/2023  [employer]reach [employer]  Manufacturer [employer] [facility] Ozempic  Type of Radiographer, therapeutic Assistance  Date Application Submitted to Manufacturer 02/25/2023  Method Application Sent to [employer]     Submitted patient portion online. Will place pcp pg in box asap.   Patient was on 1mg , unsure if dose increasing.

## 2023-03-04 DIAGNOSIS — E113292 Type 2 diabetes mellitus with mild nonproliferative diabetic retinopathy without macular edema, left eye: Secondary | ICD-10-CM | POA: Diagnosis not present

## 2023-03-04 DIAGNOSIS — H34831 Tributary (branch) retinal vein occlusion, right eye, with macular edema: Secondary | ICD-10-CM | POA: Diagnosis not present

## 2023-03-04 DIAGNOSIS — H43822 Vitreomacular adhesion, left eye: Secondary | ICD-10-CM | POA: Diagnosis not present

## 2023-03-04 DIAGNOSIS — H43813 Vitreous degeneration, bilateral: Secondary | ICD-10-CM | POA: Diagnosis not present

## 2023-03-04 DIAGNOSIS — E113211 Type 2 diabetes mellitus with mild nonproliferative diabetic retinopathy with macular edema, right eye: Secondary | ICD-10-CM | POA: Diagnosis not present

## 2023-03-07 NOTE — Progress Notes (Signed)
 SUBJECTIVE:   CHIEF COMPLAINT / HPI:   HTN- continue carvedilol  12.5 mg BID took at home, and diltiazem  360 mg daily. Home BP are 130s/70s. No signs of orthostasis. No dizziness. Tolerating well.  Left toe pain, left knee pain- for past several days, has been aching. Was sensitive to touch. NO trauma or fall, NO redness or swelling, no fevers or chills. Has not had gout flare in many years, wondering if it could be gout. Able to put on socks and shoes.   Urinary frequency- continues to have nocturia x8. Drinking 4-5 bottles of water throughout the day, the last one with pills right before bed. Eats dinner at 4-5 pm. No urge or stress incontinence. No dysuria, no fevers or chills. Did not get last VM about whether infection.  Headaches- noticing some increased frequency of headaches, has been having eye injections and working with retina specialist so wondering if it is related to her vision. She always has issues with her vision due to her retina, letters appear wavy and jumping on the page, got an injection last week. She notes they are sometimes unilateral sometimes bilateral, and at temples like a band. She notes wavy letters in her vision and blurry vision with them. She notes the headache does go away with tylenol  and vision changes can persist. She notes they are mild, denies numbness, weakness, other neurologic symptoms. No vomiting. In the past would on get headaches every few months, now having 3-4 per week for last 2 weeks since having issues with her vision, but do resolve quickly with tylenol  and just wanting to check if any other concenr. No headache currently.   OBJECTIVE:   BP 136/70   Pulse 74   Wt 159 lb (72.1 kg)   SpO2 95%   BMI 26.46 kg/m   General: A&O, NAD HEENT: No sign of trauma, subconjunctival hemorrhage noted in eye with recent injection, without drainage Cardiac: RRR, no m/r/g Respiratory: CTAB, normal WOB, no w/c/r GI: Soft, NTTP, non-distended   Extremities: left toe with bunion, no erythema, induration, non-tender to touch and able to tolerate manipulation, left knee without effusion, erythema or swelling and similar in appearance to right knee, non-tender to palpation, calf without erythema swelling or streaking.  Neuro: Normal gait, moves all four extremities appropriately. Psych: Appropriate mood and affect   Neuro: Memory: Intact .  CN II: PERRL CN III, IV,VI: EOMI CV V: Normal sensation in V1, V2, V3 CVII: Symmetric smile and brow raise CN VIII: Normal hearing CN IX,X: Symmetric palate raise  CN XI: 5/5 shoulder shrug CN XII: Symmetric tongue protrusion  UE and LE strength 5/5  Sensation normal upper and lower ext's bilaterally. Strength 5/5 in upper and lower ext's bilaterally. Gait normal.  ASSESSMENT/PLAN:   Assessment & Plan Nocturia Urinalysis with 2+ leuks but without other infectious symptoms and going several week, will send for culture but low suspicion for infection Toe pain, left Suspect arthritis, discussed without erythema, tenderness, warmth, or other signs of gout Left knee also without signs of gout- no erythema, warmth Discussed this seems most likely arthritis to me, discussed if worsening pain, redness, or tenderness could consider prednisone  burst for gout Also no redness, warmth, fevers or chills thus low concern for infection of left knee or left toe Will check uric acid as she has been on allopurinol  for years Instructed to call if changes in symptoms  Chronic tension-type headache, not intractable Overall, suspect this is in setting of recent retinal  issues and getting eye injections and strain on eyes, as vision changes present all the time, headaches are mild, and resolve with tylenol  Did get POC ESR which was negative, 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

## 2023-03-08 ENCOUNTER — Encounter: Payer: Self-pay | Admitting: Family Medicine

## 2023-03-08 ENCOUNTER — Ambulatory Visit (INDEPENDENT_AMBULATORY_CARE_PROVIDER_SITE_OTHER): Payer: Medicare Other | Admitting: Family Medicine

## 2023-03-08 VITALS — BP 136/70 | HR 74 | Wt 159.0 lb

## 2023-03-08 DIAGNOSIS — M79675 Pain in left toe(s): Secondary | ICD-10-CM | POA: Diagnosis not present

## 2023-03-08 DIAGNOSIS — R351 Nocturia: Secondary | ICD-10-CM

## 2023-03-08 DIAGNOSIS — G44229 Chronic tension-type headache, not intractable: Secondary | ICD-10-CM | POA: Diagnosis not present

## 2023-03-08 DIAGNOSIS — I10 Essential (primary) hypertension: Secondary | ICD-10-CM | POA: Diagnosis not present

## 2023-03-08 LAB — POCT URINALYSIS DIP (MANUAL ENTRY)
Bilirubin, UA: NEGATIVE
Blood, UA: NEGATIVE
Glucose, UA: NEGATIVE mg/dL
Ketones, POC UA: NEGATIVE mg/dL
Nitrite, UA: NEGATIVE
Protein Ur, POC: 100 mg/dL — AB
Spec Grav, UA: 1.02 (ref 1.010–1.025)
Urobilinogen, UA: 0.2 U/dL
pH, UA: 7 (ref 5.0–8.0)

## 2023-03-08 LAB — POCT SEDIMENTATION RATE: POCT SED RATE: 22 mm/h (ref 0–22)

## 2023-03-08 MED ORDER — DICLOFENAC SODIUM 1 % EX GEL
4.0000 g | Freq: Four times a day (QID) | CUTANEOUS | 1 refills | Status: AC
Start: 2023-03-08 — End: ?

## 2023-03-08 NOTE — Assessment & Plan Note (Signed)
 Overall, suspect this is in setting of recent retinal issues and getting eye injections and strain on eyes, as vision changes present all the time, headaches are mild, and resolve with tylenol  Did get POC ESR which was negative, pt had history of negative temporal artery biopsy in 2023 but with negative ESR this makes temporal arteritis highly unlikely, and she is describing mostly bilateral bandlike headaches that resolve with tylenol  Continue to follow with ophthomology, normal neurologic exam today for other cranial nerves, discussed red flag signs/symptoms and reasons to return

## 2023-03-08 NOTE — Assessment & Plan Note (Signed)
 Bp at goal, without signs or symptoms of orthostasis Continue current medications

## 2023-03-08 NOTE — Patient Instructions (Addendum)
 It was wonderful to see you today.  Please bring ALL of your medications with you to every visit.   Today we talked about:  For your frequent urination, try drinking less.  The carvedilol  is for your blood pressure and you can take it twice a day.  Your toe and knee don't look like a gout flare to me today, but we will check your uric acid level (looking in your blood). You can take tylenol  650 mg as needed every 8 hours, and use Voltaren  gel up to four times a day. If your toe starts getting red or more painful, we can try prednisone . If you start having fevers or chills, or redness spreading up your leg.  Thank you for choosing [facility].   Please call [phone] with any questions about today's appointment.  Please arrive at least 15 minutes prior to your scheduled appointments.   If you had blood work today, I will send you a MyChart message or a letter if results are normal. Otherwise, I will give you a call.   If you had a referral placed, they will call you to set up an appointment. Please give us  a call if you don't hear back in the next 2 weeks.   If you need additional refills before your next appointment, please call your pharmacy first.   Rollene Keeling, MD  Family Medicine

## 2023-03-09 ENCOUNTER — Other Ambulatory Visit: Payer: Self-pay | Admitting: Family Medicine

## 2023-03-09 DIAGNOSIS — I10 Essential (primary) hypertension: Secondary | ICD-10-CM

## 2023-03-09 LAB — URIC ACID: Uric Acid: 6.5 mg/dL (ref 3.1–7.9)

## 2023-03-10 LAB — URINE CULTURE

## 2023-03-22 ENCOUNTER — Encounter: Payer: Self-pay | Admitting: Internal Medicine

## 2023-03-22 ENCOUNTER — Ambulatory Visit: Payer: Self-pay | Admitting: Family Medicine

## 2023-03-22 ENCOUNTER — Ambulatory Visit (INDEPENDENT_AMBULATORY_CARE_PROVIDER_SITE_OTHER): Payer: Medicare Other | Admitting: Internal Medicine

## 2023-03-22 VITALS — BP 136/84 | HR 76 | Temp 98.3°F | Ht 65.0 in | Wt 159.0 lb

## 2023-03-22 DIAGNOSIS — I480 Paroxysmal atrial fibrillation: Secondary | ICD-10-CM | POA: Diagnosis not present

## 2023-03-22 DIAGNOSIS — R42 Dizziness and giddiness: Secondary | ICD-10-CM | POA: Diagnosis not present

## 2023-03-22 DIAGNOSIS — I10 Essential (primary) hypertension: Secondary | ICD-10-CM | POA: Diagnosis not present

## 2023-03-22 NOTE — Assessment & Plan Note (Addendum)
Still regular now--does have pacer but was regular on EKG early this morning On the diltizem and carvedilol Eliquis 5 bid also

## 2023-03-22 NOTE — Assessment & Plan Note (Signed)
Non specific Had some face burning as well---but that was transient Unclear if it could be related to her retinal issues No other neurologic symptoms Nothing to suggest acute infectious illness Is better now

## 2023-03-22 NOTE — Telephone Encounter (Signed)
Okay I will assess at the OV

## 2023-03-22 NOTE — Telephone Encounter (Signed)
  Chief Complaint: Elevated BP Symptoms: Dizziness, SOB, Shaky Frequency: One episode this AM 0200 Pertinent Negatives: Patient denies CP, SOB, dizziness now Disposition: [] ED /[] Urgent Care (no appt availability in office) / [x] Appointment(In office/virtual)/ []  [place] [facility]/ [] Home Care/ [] Refused Recommended Disposition /[] [facility] [facility]/ []  Follow-up with PCP Additional Notes: Pt reports she had an episode around 0200 this AM where she was feeling dizzy, SOB and shaky. She called EMS and her BP was 224/109, she reports EMTs stayed with her for some time, advised she was stable and it was up to her whether to go to ED and she declined at that time. Pt reports she is no longer symptomatic aside from mild intermittent palpitations and fatigue. Pt denies SOB, CP, dizziness, N/V, HA, numbness or weakness. OV scheduled for this AM. This RN educated pt on home care, new-worsening symptoms, when to call back/seek emergent care. Pt verbalized understanding and agrees to plan.    Copied from CRM [id number]. Topic: Clinical - Red Word Triage >> Mar 22, 2023  9:27 AM Steele Sizer wrote: Red Word that prompted transfer to Nurse Triage: Pt stated that she had an episode this morning around 1 or 2 am causing dizziness, could not breath, shaky, and running a fever. She said she contacted 911 and they came to check on her. She said her blood preassure was 224/109. Reason for Disposition  [1] Systolic BP  >= 200 OR Diastolic >= 120 AND [2] having NO cardiac or neurologic symptoms  Answer Assessment - Initial Assessment Questions 1. BLOOD PRESSURE: "What is the blood pressure?" "Did you take at least two measurements 5 minutes apart?"     224/109 2. ONSET: "When did you take your blood pressure?"     Taken by EMS around 0200 3. HOW: "How did you take your blood pressure?" (e.g., automatic home BP monitor, visiting nurse)     EMS obtained 4. HISTORY: "Do you have a history of high blood  pressure?"     Yes, History of A-fib 5. MEDICINES: "Are you taking any medicines for blood pressure?" "Have you missed any doses recently?"      6. OTHER SYMPTOMS: "Do you have any symptoms?" (e.g., blurred vision, chest pain, difficulty breathing, headache, weakness)     Dizziness, SOB, fever, fatigue, mild palpitations intermittently  Protocols used: Blood Pressure - High-A-AH

## 2023-03-22 NOTE — Assessment & Plan Note (Signed)
BP Readings from Last 3 Encounters:  03/22/23 136/84  03/08/23 136/70  02/15/23 (!) 154/77   Repeat by me on right is 138/78  Doing okay on cardizem 360 and carvedilol 12.5 bid

## 2023-03-22 NOTE — Progress Notes (Signed)
Subjective:    Patient ID: Courtney Pena, female    DOB: Apr 23, 1940, 83 y.o.   MRN: [id number]  HPI Here due to elevated blood pressure  Woke up to use bathroom about 1:30AM---had feeling like left side of face was burning Very dizzy --hard to walk Went to bathroom and then to living room. Called son (next door)---he called EMTs Had temp 99.4 BP 230/110 and then 224/106 Sats 96% Had palpitations --but HR 92 (EKG showed sinus and no ischemia (just poor R wave progression across the precordium) No chest pain Brief SOB--then it resolved  Decided not to go to ER--they didn't push it They checked COVID and it was negative Was able to get back to sleep  Didn't check today No recurrence of face burning or dizziness  Current Outpatient Medications on File Prior to Visit  Medication Sig Dispense Refill   acetaminophen (TYLENOL) 650 MG CR tablet Take 650 mg by mouth every 8 (eight) hours as needed for pain.     allopurinol (ZYLOPRIM) 100 MG tablet Take 1 tablet (100 mg total) by mouth daily. 90 tablet 0   Apoaequorin (PREVAGEN PO) Take 1 tablet by mouth daily.     ascorbic acid (VITAMIN C) 1000 MG tablet Take 1 tablet (1,000 mg total) by mouth daily. 120 tablet 0   blood glucose meter kit and supplies Dispense based on patient and insurance preference. Use up to four times daily as directed. (FOR ICD-10 E10.9, E11.9). 1 each 0   carvedilol (COREG) 12.5 MG tablet TAKE 1 TABLET (12.5MG  TOTAL) BY MOUTH TWICE A DAY WITH MEALS 180 tablet 1   Cholecalciferol (VITAMIN D3) 2000 units TABS Take 4,000 Units by mouth daily.     Coenzyme Q10 (COQ10) 100 MG CAPS Take 100 mg by mouth daily.     cyanocobalamin (VITAMIN B12) 1000 MCG tablet Take 1 tablet (1,000 mcg total) by mouth daily. 30 tablet 0   diclofenac Sodium (VOLTAREN) 1 % GEL Apply 4 g topically 4 (four) times daily. 100 g 1   diltiazem (CARDIZEM CD) 360 MG 24 hr capsule Take 1 capsule (360 mg total) by mouth daily. 90 capsule 3    diltiazem (CARDIZEM) 30 MG tablet One tab by mouth every six hours as needed for atrial fib 30 tablet 6   ELIQUIS 5 MG TABS tablet TAKE 1 TABLET BY MOUTH TWICE  DAILY 180 tablet 3   ferrous sulfate 325 (65 FE) MG EC tablet Take 650 mg by mouth daily.     glucose blood (ONETOUCH ULTRA) test strip 1 Container by Other route every morning.     Insulin Pen Needle 31G X 8 MM MISC Use with pen to inject once daily. 100 each 3   Lancets (ONETOUCH ULTRASOFT) lancets Use to check blood sugar daily. 100 each 3   levocetirizine (XYZAL) 5 MG tablet Take 5 mg by mouth every evening.     levothyroxine (SYNTHROID) 112 MCG tablet Take 1 tablet (112 mcg total) by mouth daily before breakfast. 90 tablet 3   meclizine (ANTIVERT) 25 MG tablet TAKE 1 TABLET (25 MG TOTAL) BY MOUTH AS NEEDED. 30 tablet 3   metFORMIN (GLUCOPHAGE-XR) 500 MG 24 hr tablet Take 1 tablet (500 mg total) by mouth daily with breakfast. Take 500 mg once daily due to decreased renal function 180 tablet 1   Multiple Vitamin (MULTI-VITAMINS) TABS Take 1 tablet by mouth daily.     nitroGLYCERIN (NITROSTAT) 0.4 MG SL tablet Place 1 tablet (0.4 mg total)  under the tongue every 5 (five) minutes as needed for chest pain (Call 911 if chest pain after three doses). 25 tablet 3   omeprazole (PRILOSEC) 40 MG capsule Take 1 capsule (40 mg total) by mouth daily. 90 capsule 3   ONETOUCH ULTRA test strip CHECK BLOOD SUGAR 3 TIMES  DAILY 300 strip 3   rosuvastatin (CRESTOR) 10 MG tablet Take 1 tablet (10 mg total) by mouth daily. 90 tablet 3   Semaglutide, 1 MG/DOSE, 2 MG/1.5ML SOPN Inject 1 mg into the skin once a week.     fluticasone (CUTIVATE) 0.05 % cream Apply 1 Application topically 2 (two) times daily. (Patient not taking: Reported on 03/22/2023)     hydrOXYzine (ATARAX) 10 MG tablet Take 1 tablet (10 mg total) by mouth every 8 (eight) hours as needed for itching. (Patient not taking: Reported on 03/22/2023) 21 tablet 0   No current facility-administered  medications on file prior to visit.    Allergies  Allergen Reactions   Actos [Pioglitazone] Swelling    Swelling all over body and moonface   Lisinopril Swelling    Swelling, may have had some breathing involvement. Angioedema   Nsaids Other (See Comments)    Patient reports internal bleeding. Experienced with tolmetin.    Penicillins Shortness Of Breath and Swelling    Arm Swelling with Penicillin (Occurred in 1960s) Breathing - throat swelling with Amoxicillin (Occurred prior to 2002)   Sulfamethoxazole Hives and Itching    "welps all over" immediately after dose   Flecainide Other (See Comments)    Blurry  vision   Hctz [Hydrochlorothiazide] Other (See Comments)    Gout   Iodinated Contrast Media Itching    Pt. Developed mild itching after receiving IV cm; pt. Held; Dr. Sheldon Silvan recomended she take 50 mg of benadryl when she goes home-if necessary; Dr Marene Lenz recommends benadryl prior to future exams requiring contrast media, but stated other doctors may recommend another premedication prep.   Prednisone Other (See Comments)    Agitation, vivid dreams, anxiety does not want to take again.    Statins Other (See Comments)    Muscle aches with multiple statins. Able to tolerate rosuvastatin.   Gabapentin Other (See Comments)    Caused dysphoria    Losartan Potassium Swelling    Lower extremity swelling   Zetia [Ezetimibe] Other (See Comments)    Cramps    Past Medical History:  Diagnosis Date   Allergy    Arthritis    Atrial flutter (HCC) 12/07/2010   converted in ED with 300 mg flecainide   CAD (coronary artery disease)    a. mild per cath in 2004;  b. nonischemic Myoview in March 2012;  c. Lex MV 1/14:  EF 66%, no ischemia   Cataract    Dysrhythmia    A-Fib. 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        Past Surgical History:  Procedure Laterality Date   ARTERY BIOPSY N/A 08/31/2021   Procedure: LEFT TEMPORAL ARTERY BIOPSY;  Surgeon: Abigail Miyamoto, MD;  Location: [facility];  Service: General;  Laterality: N/A;   BREAST BIOPSY Left    BREAST EXCISIONAL BIOPSY Left    benign   CARDIAC CATHETERIZATION  1999&2004   CARPAL TUNNEL RELEASE Bilateral 2003   CATARACT EXTRACTION Bilateral 1990   CHOLECYSTECTOMY N/A 07/05/2021   Procedure: LAPAROSCOPIC CHOLECYSTECTOMY;  Surgeon: Quentin Ore, MD;  Location: [facility];  Service: General;  Laterality: N/A;   COLONOSCOPY     ESOPHAGOGASTRODUODENOSCOPY  08/13/2011   Procedure: ESOPHAGOGASTRODUODENOSCOPY (EGD);  Surgeon: Iva Boop, MD;  Location: [facility];  Service: Endoscopy;  Laterality: N/A;   implantable loop recorder placement  07/20/2020   Medtronic Reveal Linq model LNQ 22 ([place] [id number]) implantable loop recorder by Dr Johney Frame    Family History  Problem Relation Age of Onset   Heart disease Father    Diabetes Maternal Grandfather    Hypertension Mother    Other Mother        brain tumor-benign   Diabetes Daughter        pre-diabeties   Colon polyps Daughter    Thyroid disease Daughter        x 2   Colon cancer Neg Hx    Esophageal cancer Neg Hx    Stomach cancer Neg Hx    Rectal cancer Neg Hx     Social History   Socioeconomic History   Marital status: Widowed    Spouse name: Greggory Stallion   Number of children: 4   Years of education: some colle   Highest education level: Not on file   Occupational History   Occupation: Management    Employer: UNEMPLOYED  Tobacco Use   Smoking status: Former    Current packs/day: 0.00    Average packs/day: 2.0 packs/day for 30.0 years (60.0 ttl pk-yrs)    Types: Cigarettes    Start date: 02/27/1963    Quit date: 02/26/1993    Years since quitting: 30.0    Passive exposure: Past   Smokeless tobacco: Never   Tobacco comments:    Former smoker 03/21/22  Vaping Use   Vaping status: Never Used  Substance and Sexual Activity   Alcohol use: No    Alcohol/week: 0.0 standard drinks of alcohol   Drug use: No   Sexual activity: Not Currently  Other Topics Concern   Not on file  Social History Narrative   Not on file   Social Drivers of Health   Financial Resource Strain: Not on file  Food Insecurity: No Food Insecurity (01/05/2022)   Hunger Vital Sign    Worried About Running Out of Food in the Last Year: Never true    Ran Out of Food in the Last Year: Never true  Transportation Needs: No Transportation Needs (01/05/2022)   PRAPARE - Administrator, Civil Service (Medical): No    Lack of Transportation (Non-Medical): No  Physical Activity: Not on file  Stress: Not on file  Social Connections: Not on file  Intimate Partner Violence: Not At Risk (01/05/2022)   Humiliation, Afraid, Rape, and Kick questionnaire    Fear of Current or Ex-Partner: No    Emotionally Abused: No    Physically Abused: No    Sexually Abused: No   Review of Systems  No recent cough Had slight GI rumbling yesterday--but not like an illness No dysuria or urgency--just increased frequency    Objective:   Physical Exam Constitutional:      Appearance: Normal appearance.  Cardiovascular:     Rate and Rhythm: Normal rate and regular rhythm.     Heart sounds: No murmur heard.    No gallop.  Pulmonary:     Effort: Pulmonary effort is normal.     Breath sounds: Normal breath sounds. No wheezing or rales.  Musculoskeletal:     Cervical back:  Neck supple.     Right lower leg: No edema.     Left lower leg: No edema.  Lymphadenopathy:     Cervical: No cervical adenopathy.  Neurological:     General: No focal deficit present.     Mental Status: She is alert.            Assessment & Plan:

## 2023-03-25 ENCOUNTER — Ambulatory Visit (INDEPENDENT_AMBULATORY_CARE_PROVIDER_SITE_OTHER): Payer: Medicare Other

## 2023-03-25 DIAGNOSIS — I480 Paroxysmal atrial fibrillation: Secondary | ICD-10-CM | POA: Diagnosis not present

## 2023-03-25 LAB — CUP PACEART REMOTE DEVICE CHECK
Date Time Interrogation Session: 20250126230830
Implantable Pulse Generator Implant Date: 20220525

## 2023-03-29 ENCOUNTER — Encounter: Payer: Self-pay | Admitting: Cardiovascular Disease

## 2023-04-01 DIAGNOSIS — H43813 Vitreous degeneration, bilateral: Secondary | ICD-10-CM | POA: Diagnosis not present

## 2023-04-01 DIAGNOSIS — E113213 Type 2 diabetes mellitus with mild nonproliferative diabetic retinopathy with macular edema, bilateral: Secondary | ICD-10-CM | POA: Diagnosis not present

## 2023-04-01 DIAGNOSIS — H34831 Tributary (branch) retinal vein occlusion, right eye, with macular edema: Secondary | ICD-10-CM | POA: Diagnosis not present

## 2023-04-01 DIAGNOSIS — H43822 Vitreomacular adhesion, left eye: Secondary | ICD-10-CM | POA: Diagnosis not present

## 2023-04-01 NOTE — Progress Notes (Signed)
 [facility] Summary Report / Loop Recorder

## 2023-04-01 NOTE — Addendum Note (Signed)
Addended by: Geralyn Flash D on: 04/01/2023 10:48 AM   Modules accepted: Orders

## 2023-04-09 ENCOUNTER — Ambulatory Visit: Payer: Medicare Other | Admitting: General Practice

## 2023-04-16 ENCOUNTER — Telehealth: Payer: Self-pay

## 2023-04-16 NOTE — Telephone Encounter (Signed)
Informed patient her novo nordisk shipment is ready for pickup.  4 boxes of Ozempic 1mg  dose pens are labeled and ready in med room fridge - 4 month supply.

## 2023-04-24 DIAGNOSIS — H0288A Meibomian gland dysfunction right eye, upper and lower eyelids: Secondary | ICD-10-CM | POA: Diagnosis not present

## 2023-04-24 DIAGNOSIS — H0288B Meibomian gland dysfunction left eye, upper and lower eyelids: Secondary | ICD-10-CM | POA: Diagnosis not present

## 2023-04-24 DIAGNOSIS — Z9842 Cataract extraction status, left eye: Secondary | ICD-10-CM | POA: Diagnosis not present

## 2023-04-24 DIAGNOSIS — H5213 Myopia, bilateral: Secondary | ICD-10-CM | POA: Diagnosis not present

## 2023-04-24 DIAGNOSIS — Z9841 Cataract extraction status, right eye: Secondary | ICD-10-CM | POA: Diagnosis not present

## 2023-04-29 ENCOUNTER — Ambulatory Visit (INDEPENDENT_AMBULATORY_CARE_PROVIDER_SITE_OTHER): Payer: Medicare Other

## 2023-04-29 DIAGNOSIS — I480 Paroxysmal atrial fibrillation: Secondary | ICD-10-CM | POA: Diagnosis not present

## 2023-04-29 DIAGNOSIS — H43822 Vitreomacular adhesion, left eye: Secondary | ICD-10-CM | POA: Diagnosis not present

## 2023-04-29 DIAGNOSIS — H34831 Tributary (branch) retinal vein occlusion, right eye, with macular edema: Secondary | ICD-10-CM | POA: Diagnosis not present

## 2023-04-29 DIAGNOSIS — E113213 Type 2 diabetes mellitus with mild nonproliferative diabetic retinopathy with macular edema, bilateral: Secondary | ICD-10-CM | POA: Diagnosis not present

## 2023-04-29 DIAGNOSIS — H43813 Vitreous degeneration, bilateral: Secondary | ICD-10-CM | POA: Diagnosis not present

## 2023-04-29 LAB — CUP PACEART REMOTE DEVICE CHECK
Date Time Interrogation Session: 20250302230520
Implantable Pulse Generator Implant Date: 20220525

## 2023-04-29 LAB — HM DIABETES EYE EXAM

## 2023-05-01 ENCOUNTER — Encounter: Payer: Self-pay | Admitting: Cardiovascular Disease

## 2023-05-06 NOTE — Progress Notes (Signed)
 [facility] Summary Report / Loop Recorder

## 2023-05-15 ENCOUNTER — Ambulatory Visit (INDEPENDENT_AMBULATORY_CARE_PROVIDER_SITE_OTHER): Payer: Medicare Other | Admitting: General Practice

## 2023-05-15 ENCOUNTER — Encounter: Payer: Self-pay | Admitting: General Practice

## 2023-05-15 VITALS — BP 134/80 | HR 89 | Temp 97.4°F | Ht 64.0 in | Wt 159.0 lb

## 2023-05-15 DIAGNOSIS — E113399 Type 2 diabetes mellitus with moderate nonproliferative diabetic retinopathy without macular edema, unspecified eye: Secondary | ICD-10-CM

## 2023-05-15 DIAGNOSIS — N1832 Chronic kidney disease, stage 3b: Secondary | ICD-10-CM

## 2023-05-15 DIAGNOSIS — H34831 Tributary (branch) retinal vein occlusion, right eye, with macular edema: Secondary | ICD-10-CM | POA: Insufficient documentation

## 2023-05-15 DIAGNOSIS — I10 Essential (primary) hypertension: Secondary | ICD-10-CM | POA: Diagnosis not present

## 2023-05-15 DIAGNOSIS — M069 Rheumatoid arthritis, unspecified: Secondary | ICD-10-CM

## 2023-05-15 DIAGNOSIS — I5032 Chronic diastolic (congestive) heart failure: Secondary | ICD-10-CM

## 2023-05-15 DIAGNOSIS — Z7689 Persons encountering health services in other specified circumstances: Secondary | ICD-10-CM | POA: Insufficient documentation

## 2023-05-15 DIAGNOSIS — E1169 Type 2 diabetes mellitus with other specified complication: Secondary | ICD-10-CM | POA: Diagnosis not present

## 2023-05-15 DIAGNOSIS — R350 Frequency of micturition: Secondary | ICD-10-CM

## 2023-05-15 DIAGNOSIS — I2510 Atherosclerotic heart disease of native coronary artery without angina pectoris: Secondary | ICD-10-CM

## 2023-05-15 DIAGNOSIS — E785 Hyperlipidemia, unspecified: Secondary | ICD-10-CM | POA: Diagnosis not present

## 2023-05-15 DIAGNOSIS — E1140 Type 2 diabetes mellitus with diabetic neuropathy, unspecified: Secondary | ICD-10-CM | POA: Diagnosis not present

## 2023-05-15 DIAGNOSIS — E7800 Pure hypercholesterolemia, unspecified: Secondary | ICD-10-CM

## 2023-05-15 DIAGNOSIS — Z8739 Personal history of other diseases of the musculoskeletal system and connective tissue: Secondary | ICD-10-CM

## 2023-05-15 DIAGNOSIS — E1122 Type 2 diabetes mellitus with diabetic chronic kidney disease: Secondary | ICD-10-CM | POA: Diagnosis not present

## 2023-05-15 LAB — POC URINALSYSI DIPSTICK (AUTOMATED)
Bilirubin, UA: POSITIVE
Blood, UA: 10
Glucose, UA: NEGATIVE
Ketones, UA: NEGATIVE
Nitrite, UA: NEGATIVE
Protein, UA: POSITIVE — AB
Spec Grav, UA: >=1.03 — AB (ref 1.010–1.025)
Urobilinogen, UA: 0.2 U/dL
pH, UA: 5 (ref 5.0–8.0)

## 2023-05-15 LAB — COMPREHENSIVE METABOLIC PANEL
ALT: 8 U/L (ref 0–35)
AST: 13 U/L (ref 0–37)
Albumin: 4.4 g/dL (ref 3.5–5.2)
Alkaline Phosphatase: 62 U/L (ref 39–117)
BUN: 32 mg/dL — ABNORMAL HIGH (ref 6–23)
CO2: 29 meq/L (ref 19–32)
Calcium: 10.1 mg/dL (ref 8.4–10.5)
Chloride: 100 meq/L (ref 96–112)
Creatinine, Ser: 1.37 mg/dL — ABNORMAL HIGH (ref 0.40–1.20)
GFR: 35.82 mL/min — ABNORMAL LOW (ref >60.00)
Glucose, Bld: 147 mg/dL — ABNORMAL HIGH (ref 70–99)
Potassium: 4.2 meq/L (ref 3.5–5.1)
Sodium: 136 meq/L (ref 135–145)
Total Bilirubin: 0.4 mg/dL (ref 0.2–1.2)
Total Protein: 7.6 g/dL (ref 6.0–8.3)

## 2023-05-15 LAB — HEMOGLOBIN A1C: Hgb A1c MFr Bld: 7.8 % — ABNORMAL HIGH (ref 4.6–6.5)

## 2023-05-15 NOTE — Assessment & Plan Note (Signed)
Continue Crestor 10 mg once daily. 

## 2023-05-15 NOTE — Progress Notes (Signed)
 New Patient Office Visit  Subjective    Patient ID: Courtney Pena, female    DOB: 02-Sep-1940  Age: 83 y.o. MRN: [id number]  CC:  Chief Complaint  Patient presents with   New Patient (Initial Visit)    Discuss meds; doesn't know if she needs to be on metoprolol, metformin, or carvedilol   Urinary Frequency    And pressure since September. Does see urology and has been told everything's fine and patient doesn't know if it has to do with her kidney issues or not.     HPI Courtney Pena is a 83 y.o. female presents to establish care, discuss chronic care management, medications and urinary frequency.   Previous PCP- [name], MD.   Hypertension/Paroxysmal atrial fibrillation/Chronic diastolic CHF/hypercholesteremia: sees cardiology. Currently managed on Carvedilol 12.5 BID, Diltiazem 360 mg once daily, Eliquis 5 mg BID, Rosuvastatin 10 mg once daily. Takes her medication as prescribed. She also has a prescription for Diltiazem 30 mg to take as needed for Afib. She has not had to use that. No concerns today. No chest pain, shortness of breath or difficulty breathing. She is going to establish with new cardiology next month due to location.   Type 2 DM : Current medications include: Metformin XR 500 mg once daily and semaglutide 1 mg once weekly. She  She is checking her blood glucose once weekly or biweekly and is getting readings of 110-170s. However, this week her reading was 246.  Last A1C: 7.0 on 02/11/23 Last Eye Exam:04/29/23 Last Foot Exam: due Pneumonia Vaccination:  2015 Urine Microalbumin: due Statin:Rosuvastatin  Urinary Frequency: Symptom onset was September. She has had multiple urinalysis and urine cultures tested which the last one being on 03/08/23 which showed moderate leukocytes and protein; urine culture showed mixed flora but no infection. Today she reports, she continues to the urge to go all the time especially at night. When she goes to  the bathroom, she usually have a few drops. She drinks about 4 of 16 oz of water daily. She is not sexually active, denies any vaginal itching or discharge. She denies any visible bleeding or hematuria. No fever ,chills, nausea or vomiting.  Outpatient Encounter Medications as of 05/15/2023  Medication Sig   allopurinol (ZYLOPRIM) 100 MG tablet Take 1 tablet (100 mg total) by mouth daily.   Apoaequorin (PREVAGEN PO) Take 1 tablet by mouth daily.   blood glucose meter kit and supplies Dispense based on patient and insurance preference. Use up to four times daily as directed. (FOR ICD-10 E10.9, E11.9).   carvedilol (COREG) 12.5 MG tablet TAKE 1 TABLET (12.5MG  TOTAL) BY MOUTH TWICE A DAY WITH MEALS   Coenzyme Q10 (COQ10) 100 MG CAPS Take 100 mg by mouth daily.   diclofenac Sodium (VOLTAREN) 1 % GEL Apply 4 g topically 4 (four) times daily.   diltiazem (CARDIZEM CD) 360 MG 24 hr capsule Take 1 capsule (360 mg total) by mouth daily.   diltiazem (CARDIZEM) 30 MG tablet One tab by mouth every six hours as needed for atrial fib   glucose blood (ONETOUCH ULTRA) test strip 1 Container by Other route every morning.   Insulin Pen Needle 31G X 8 MM MISC Use with pen to inject once daily.   Lancets (ONETOUCH ULTRASOFT) lancets Use to check blood sugar daily.   levothyroxine (SYNTHROID) 112 MCG tablet Take 1 tablet (112 mcg total) by mouth daily before breakfast.   meclizine (ANTIVERT) 25 MG tablet TAKE 1 TABLET (25 MG TOTAL)  BY MOUTH AS NEEDED.   metFORMIN (GLUCOPHAGE-XR) 500 MG 24 hr tablet Take 1 tablet (500 mg total) by mouth daily with breakfast. Take 500 mg once daily due to decreased renal function   Multiple Vitamin (MULTI-VITAMINS) TABS Take 1 tablet by mouth daily.   nitroGLYCERIN (NITROSTAT) 0.4 MG SL tablet Place 1 tablet (0.4 mg total) under the tongue every 5 (five) minutes as needed for chest pain (Call 911 if chest pain after three doses).   omeprazole (PRILOSEC) 40 MG capsule Take 1 capsule (40  mg total) by mouth daily.   ONETOUCH ULTRA test strip CHECK BLOOD SUGAR 3 TIMES  DAILY   rosuvastatin (CRESTOR) 10 MG tablet Take 1 tablet (10 mg total) by mouth daily.   [DISCONTINUED] acetaminophen (TYLENOL) 650 MG CR tablet Take 650 mg by mouth every 8 (eight) hours as needed for pain.   [DISCONTINUED] ascorbic acid (VITAMIN C) 1000 MG tablet Take 1 tablet (1,000 mg total) by mouth daily.   [DISCONTINUED] Cholecalciferol (VITAMIN D3) 2000 units TABS Take 4,000 Units by mouth daily.   [DISCONTINUED] cyanocobalamin (VITAMIN B12) 1000 MCG tablet Take 1 tablet (1,000 mcg total) by mouth daily.   [DISCONTINUED] ELIQUIS 5 MG TABS tablet TAKE 1 TABLET BY MOUTH TWICE  DAILY   [DISCONTINUED] ferrous sulfate 325 (65 FE) MG EC tablet Take 650 mg by mouth daily.   [DISCONTINUED] fluticasone (CUTIVATE) 0.05 % cream Apply 1 Application topically 2 (two) times daily. (Patient not taking: Reported on 03/22/2023)   [DISCONTINUED] hydrOXYzine (ATARAX) 10 MG tablet Take 1 tablet (10 mg total) by mouth every 8 (eight) hours as needed for itching. (Patient not taking: Reported on 03/22/2023)   [DISCONTINUED] levocetirizine (XYZAL) 5 MG tablet Take 5 mg by mouth every evening.   [DISCONTINUED] Semaglutide, 1 MG/DOSE, 2 MG/1.5ML SOPN Inject 1 mg into the skin once a week.   No facility-administered encounter medications on file as of 05/15/2023.    Past Medical History:  Diagnosis Date   Adenomatous polyp of colon 10/23/2021   Last colonoscopy was 02/2018 recommended 3 year follow up.       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  Did not tolerate zaralto for PAF.  Seems to tolerate ASA.Marland Kitchen  Likely needs long term iron to keep up with ongoing loss.     Arthritis    Atrial flutter (HCC) 12/07/2010   converted in ED with 300 mg flecainide   CAD (coronary  artery disease)    a. mild per cath in 2004;  b. nonischemic Myoview in March 2012;  c. Lex MV 1/14:  EF 66%, no ischemia   Cataract    Cold intolerance of hand 12/18/2021   Degenerative arthritis of thumb 02/12/2014   Disequilibrium 04/25/2006   Formatting of this note might be different from the original. Formatting of this note might be different from the original. Qualifier: Diagnosis of By: Leveda Anna MD, Chrissie Noa Last Assessment & Plan: Formatting of this note might be different from the original. Now describing ear fullness more on left side.  Perhaps the removal of cerumen impaction will help.  No further WU given previously seen by ENT   Diverticulosis of colon 04/25/2006   Qualifier: Diagnosis of   By: Leveda Anna MD, Chrissie Noa      Replacing diagnoses that were inactivated after the 05/28/22 regulatory import  DJD, UNSPECIFIED 04/25/2006   Qualifier: Diagnosis of   By: Leveda Anna MD, Chrissie Noa      Replacing diagnoses that were inactivated after the 05/28/22 regulatory import     Dysrhythmia    A-Fib. 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     Hyperlipidemia    Hypertension    Hypothyroidism    Neuromuscular disorder (HCC)    neuropathy in feet   Obesity    Personal history of colonic polyps 06/06/2009   cecal polyp   Rheumatoid arthritis (HCC)    Shingles 2023   Sleep apnea    lost weight no longer needs   Type 2 diabetes mellitus with diabetic chronic kidney disease (HCC) 04/25/2006        Past Surgical History:  Procedure Laterality Date   ARTERY BIOPSY N/A 08/31/2021   Procedure: LEFT TEMPORAL ARTERY BIOPSY;  Surgeon: Abigail Miyamoto, MD;   Location: [facility];  Service: General;  Laterality: N/A;   BREAST BIOPSY Left    BREAST EXCISIONAL BIOPSY Left    benign   CARDIAC CATHETERIZATION  1999&2004   CARPAL TUNNEL RELEASE Bilateral 2003   CATARACT EXTRACTION Bilateral 1990   CHOLECYSTECTOMY N/A 07/05/2021   Procedure: LAPAROSCOPIC CHOLECYSTECTOMY;  Surgeon: Quentin Ore, MD;  Location: [facility];  Service: General;  Laterality: N/A;   COLONOSCOPY     ESOPHAGOGASTRODUODENOSCOPY  08/13/2011   Procedure: ESOPHAGOGASTRODUODENOSCOPY (EGD);  Surgeon: Iva Boop, MD;  Location: [facility] END[facility];  Service: Endoscopy;  Laterality: N/A;   implantable loop recorder placement  07/20/2020   Medtronic Reveal Linq model LNQ 22 ([place] [id number]) implantable loop recorder by Dr Johney Frame    Family History  Problem Relation Age of Onset   Heart disease Father    Diabetes Maternal Grandfather    Hypertension Mother    Other Mother        brain tumor-benign   Diabetes Daughter        pre-diabeties   Colon polyps Daughter    Thyroid disease Daughter        x 2   Colon cancer Neg Hx    Esophageal cancer Neg Hx    Stomach cancer Neg Hx    Rectal cancer Neg Hx     Social History   Socioeconomic History   Marital status: Widowed    Spouse name: Greggory Stallion   Number of children: 4   Years of education: some colle   Highest education level: Not on file  Occupational History   Occupation: Management    Employer: UNEMPLOYED  Tobacco Use   Smoking status: Former    Current packs/day: 0.00    Average packs/day: 2.0 packs/day for 30.0 years (60.0 ttl pk-yrs)    Types: Cigarettes    Start date: 02/27/1963    Quit date: 02/26/1993    Years since quitting: 30.2    Passive exposure: Past   Smokeless tobacco: Never   Tobacco comments:    Former smoker 03/21/22  Vaping Use   Vaping status: Never Used  Substance and Sexual Activity   Alcohol use: No    Alcohol/week: 0.0 standard drinks of alcohol  Drug use: No   Sexual activity: Not Currently   Other Topics Concern   Not on file  Social History Narrative   Not on file   Social Drivers of Health   Financial Resource Strain: Not on file  Food Insecurity: No Food Insecurity (01/05/2022)   Hunger Vital Sign    Worried About Running Out of Food in the Last Year: Never true    Ran Out of Food in the Last Year: Never true  Transportation Needs: No Transportation Needs (01/05/2022)   PRAPARE - Administrator, Civil Service (Medical): No    Lack of Transportation (Non-Medical): No  Physical Activity: Not on file  Stress: Not on file  Social Connections: Not on file  Intimate Partner Violence: Not At Risk (01/05/2022)   Humiliation, Afraid, Rape, and Kick questionnaire    Fear of Current or Ex-Partner: No    Emotionally Abused: No    Physically Abused: No    Sexually Abused: No    Review of Systems  Constitutional:  Negative for chills and fever.  Respiratory:  Negative for shortness of breath.   Cardiovascular:  Negative for chest pain.  Gastrointestinal:  Negative for abdominal pain, constipation, diarrhea, heartburn, nausea and vomiting.  Genitourinary:  Positive for frequency and urgency. Negative for dysuria.  Neurological:  Negative for dizziness and headaches.  Endo/Heme/Allergies:  Negative for polydipsia.  Psychiatric/Behavioral:  Negative for depression and suicidal ideas. The patient is not nervous/anxious.         Objective    BP 134/80 (BP Location: Left Arm, Patient Position: Sitting, Cuff Size: Normal)   Pulse 89   Temp (!) 97.4 F (36.3 C) (Oral)   Ht 5\' 4"  (1.626 m)   Wt 159 lb (72.1 kg)   SpO2 99%   BMI 27.29 kg/m   Physical Exam Vitals and nursing note reviewed.  Constitutional:      Appearance: Normal appearance.  Cardiovascular:     Rate and Rhythm: Normal rate and regular rhythm.     Pulses: Normal pulses.     Heart sounds: Normal heart sounds.  Pulmonary:     Effort: Pulmonary effort is normal.     Breath sounds:  Normal breath sounds.  Abdominal:     General: Bowel sounds are normal. There is no distension.     Palpations: There is no mass.     Tenderness: There is no abdominal tenderness. There is no right CVA tenderness, left CVA tenderness or guarding.     Hernia: No hernia is present.  Neurological:     Mental Status: She is alert and oriented to person, place, and time.  Psychiatric:        Mood and Affect: Mood normal.        Behavior: Behavior normal.        Thought Content: Thought content normal.        Judgment: Judgment normal.         Assessment & Plan:  Establishing care with new doctor, encounter for Assessment & Plan: EMR reviewed briefly.   Type 2 diabetes mellitus with diabetic neuropathy, without long-term current use of insulin (HCC) -     Comprehensive metabolic panel -     Hemoglobin A1c  Urinary frequency Assessment & Plan: Unclear etiology.   Chronic.   POC UA- moderate leukocytes and protein.  Urine culture pending.   Consider urology referral.   Orders: -     POCT Urinalysis Dipstick (Automated) -  Urine Culture  Essential hypertension, benign Assessment & Plan: BP at goal today.   Discussed at length with patient.  Continue Cardizem 360 and carvedilol 12.5 mg BID.  Discussed to keep BP log at home and bring back in 2 weeks.  Orders: -     Comprehensive metabolic panel  Rheumatoid arthritis involving both feet, unspecified whether rheumatoid factor present (HCC)  Chronic diastolic CHF (congestive heart failure) (HCC) Assessment & Plan: Controlled.  Continue off meds.   Scheduled to see cardiology.   Type 2 diabetes mellitus with stage 3b chronic kidney disease, without long-term current use of insulin (HCC)  Branch retinal vein occlusion of right eye with macular edema Assessment & Plan: Follows with ophthalmology.    Type 2 diabetes mellitus with chronic kidney disease, without long-term current use of insulin, unspecified  CKD stage (HCC) Assessment & Plan: Chronic and controlled.   Repeat A1c pending.  Continue GLP-1 Ozempic at 1 mg weekly, metformin 500 mg once daily. Consider SGLT2 for renal protection.   Urine ACR and Foot exam due and will be completed at next visit.   CMP pending.   Atherosclerosis of native coronary artery of native heart without angina pectoris Assessment & Plan: Continue Crestor 10 mg once daily.   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   Followed by ophthalmology.    History of gout Assessment & Plan: Continue Allopurinol.  Reviewed labs from January, 2025.   Per rheumatology, goal uric acid < 6.   HYPERCHOLESTEROLEMIA Assessment & Plan: Continue Rosuvastatin.  Scheduled to see cardiology.   Hyperlipidemia associated with type 2 diabetes mellitus (HCC) Assessment & Plan: Continue rosuvastatin 10 mg daily.  Reviewed lipid panel.   Cmp pending.     Return in about 2 weeks (around 05/29/2023) for urinary symptoms, chronic care management- foot exam..   Modesto Charon, NP

## 2023-05-15 NOTE — Assessment & Plan Note (Signed)
 Follows with ophthalmology

## 2023-05-15 NOTE — Assessment & Plan Note (Signed)
 Unclear etiology.   Chronic.   POC UA- moderate leukocytes and protein.  Urine culture pending.   Consider urology referral.

## 2023-05-15 NOTE — Patient Instructions (Addendum)
 Stop by the lab prior to leaving today. I will notify you of your results once received.   Schedule tetanus (tdap) at pharmacy or health department.   Monitor BP at home and bring log back. Please report readings consistently above 140/90 and below 90/60.  Start checking your blood sugar levels.  Appropriate times to check your blood sugar levels are:  -Before any meal (breakfast, lunch, dinner) -Two hours after any meal (breakfast, lunch, dinner) -Bedtime  Record your readings and notify me if you continue to consistently run at or above 150.    Continue taking medications as prescribed.   Try Azo over the counter for the urinary symptoms.   Follow up in 2 weeks.  It was a pleasure meeting you!

## 2023-05-15 NOTE — Assessment & Plan Note (Signed)
 Chronic and controlled.   Repeat A1c pending.  Continue GLP-1 Ozempic at 1 mg weekly, metformin 500 mg once daily. Consider SGLT2 for renal protection.   Urine ACR and Foot exam due and will be completed at next visit.   CMP pending.

## 2023-05-15 NOTE — Assessment & Plan Note (Addendum)
 BP at goal today.   Discussed at length with patient.  Continue Cardizem 360 and carvedilol 12.5 mg BID.  Discussed to keep BP log at home and bring back in 2 weeks.

## 2023-05-15 NOTE — Assessment & Plan Note (Signed)
 Continue Rosuvastatin.  Scheduled to see cardiology.

## 2023-05-15 NOTE — Assessment & Plan Note (Signed)
 Continue Allopurinol.  Reviewed labs from January, 2025.   Per rheumatology, goal uric acid < 6.

## 2023-05-15 NOTE — Assessment & Plan Note (Signed)
 Hemoglobin A1c pending.   Followed by ophthalmology.

## 2023-05-15 NOTE — Assessment & Plan Note (Signed)
 Continue rosuvastatin 10 mg daily.  Reviewed lipid panel.   Cmp pending.

## 2023-05-15 NOTE — Assessment & Plan Note (Signed)
 EMR reviewed briefly.

## 2023-05-15 NOTE — Assessment & Plan Note (Signed)
 Controlled.  Continue off meds.   Scheduled to see cardiology.

## 2023-05-16 ENCOUNTER — Other Ambulatory Visit: Payer: Self-pay | Admitting: General Practice

## 2023-05-16 DIAGNOSIS — I480 Paroxysmal atrial fibrillation: Secondary | ICD-10-CM

## 2023-05-16 DIAGNOSIS — I10 Essential (primary) hypertension: Secondary | ICD-10-CM

## 2023-05-16 DIAGNOSIS — E1169 Type 2 diabetes mellitus with other specified complication: Secondary | ICD-10-CM

## 2023-05-16 DIAGNOSIS — N1832 Chronic kidney disease, stage 3b: Secondary | ICD-10-CM

## 2023-05-16 DIAGNOSIS — E1122 Type 2 diabetes mellitus with diabetic chronic kidney disease: Secondary | ICD-10-CM

## 2023-05-16 LAB — URINE CULTURE
MICRO NUMBER:: 16221023
SPECIMEN QUALITY:: ADEQUATE

## 2023-05-17 ENCOUNTER — Other Ambulatory Visit: Payer: Self-pay | Admitting: General Practice

## 2023-05-17 ENCOUNTER — Encounter: Payer: Self-pay | Admitting: General Practice

## 2023-05-17 DIAGNOSIS — R350 Frequency of micturition: Secondary | ICD-10-CM

## 2023-05-23 ENCOUNTER — Telehealth: Payer: Self-pay | Admitting: General Practice

## 2023-05-23 ENCOUNTER — Other Ambulatory Visit

## 2023-05-23 ENCOUNTER — Encounter: Payer: Self-pay | Admitting: Pharmacist

## 2023-05-23 NOTE — Progress Notes (Deleted)
 05/23/2023 Name: Courtney Pena MRN: [id number] DOB: August 10, 1940  Subjective  No chief complaint on file.   Reason for visit: ?  Courtney Pena is a 83 y.o. female with a history of diabetes (type 2), who presents today for an initial visit for patient assistance surrounding Ozempic.? Pertinent PMH also includes HTN, PAF, CHF, hypothyroid, HLD, RA.  Known DM Complications: retinopathy   Care Team: Primary Care Provider: Modesto Charon, NP   Recent Summary of Change: 3/19: Transition of Care to new PCP. Has been receiving Ozempic through PAP. Requires assistance transitioning PAP to new PCP office.   Medication Access/Adherence: Prescription drug coverage: Payor: [employer] MEDICARE / Plan: [facility] MEDICARE / Product Type: *No Product type* / .  - Reports that all medications are affordable.  - Current Patient Assistance: [employer] (Ozempic). Enrolled through previous PCP.  - Last shipment: 72-month supply delivered on 04/16/23   Since Last visit / History of Present Illness: ?  Patient reports implementing*** plan from last visit. Denies adverse effects with titration of ***.   Reported DM Regimen: ?  Metformin XR 500 mg daily Ozempic 1 mg weekly   SMBG {Blank single:19197::"Per BG meter","Per patient provided BG log","Per patient memory","***"}: ?  ***   Hypo/Hyperglycemia: ?  Symptoms of hypoglycemia since last visit:? {Blank multiple:19197::"yes","no","n/a","***"}  If yes, it was treated by: {Blank multiple:19197::"***","n/a"}  Symptoms of hyperglycemia since last visit:? {Blank multiple:19197::"yes","no","n/a","***"} - {symptoms; hyperglycemia:17903}   DM Prevention:  Statin: Taking; moderate intensity.?  History of chronic kidney disease? yes History of albuminuria? yes, last UACR on 08/28/21 >30 mg/g ACE/ARB - Not taking; Urine MA/CR Ratio - Elevated.  Last eye exam: 04/29/23; Retinopathy Present Last foot exam: 02/22/2022 Tobacco Use: Former  smoker  Immunizations:? Flu: {LZDUEUPTODATE:31033} (Last: 12/12/2022); Pneumococcal: {LZVAXpneumococcal:31032:::0} - {LZVAXpneumo RISKAge19-64:31093}; Shingrix: {LZDUEUPTODATE:31033} (Last: 03/23/2022, 10/31/2021); Covid (***)  Cardiovascular Risk Reduction History of clinical ASCVD? {Blank single:19197::"yes","no"} History of heart failure? yes History of hyperlipidemia?  Current BMI: 27.3 kg/m2 (Ht 64 in, Wt 72.1 kg) Taking statin? yes; moderate intensity Taking aspirin? Not taking  Taking SGLT-2i? no Taking GLP- 1 RA? yes     _______________________________________________  Objective    Vitals:  Wt Readings from Last 3 Encounters:  05/15/23 159 lb (72.1 kg)  03/22/23 159 lb (72.1 kg)  03/08/23 159 lb (72.1 kg)   BP Readings from Last 3 Encounters:  05/15/23 134/80  03/22/23 136/84  03/08/23 136/70   Pulse Readings from Last 3 Encounters:  05/15/23 89  03/22/23 76  03/08/23 74     Labs:?  Lab Results  Component Value Date   HGBA1C 7.8 (H) 05/15/2023   HGBA1C 7.0 02/11/2023   HGBA1C 6.8 10/23/2022   GLUCOSE 147 (H) 05/15/2023   MICRALBCREAT 30-300mg  08/28/2021   CREATININE 1.37 (H) 05/15/2023   CREATININE 0.94 01/12/2023   CREATININE 1.25 (H) 08/29/2022   GFR 35.82 (L) 05/15/2023   GFR 57.88 (L) 03/21/2021   GFR 75.20 03/20/2017   Lab Results  Component Value Date   CHOL 109 02/22/2022   LDLCALC 29 02/22/2022   LDLCALC 69 07/14/2020   LDLCALC 56 05/25/2019   LDLDIRECT 62 11/24/2018   LDLDIRECT 181 (H) 08/28/2018   HDL 61 02/22/2022   TRIG 107 02/22/2022   TRIG 77 07/14/2020   TRIG 128 05/25/2019   ALT 8 05/15/2023   ALT 17 01/12/2023   AST 13 05/15/2023   AST 19 01/12/2023      Chemistry      Component Value Date/Time  NA 136 05/15/2023 1017   NA 136 08/29/2022 1040   K 4.2 05/15/2023 1017   CL 100 05/15/2023 1017   CO2 29 05/15/2023 1017   BUN 32 (H) 05/15/2023 1017   BUN 32 (H) 08/29/2022 1040   CREATININE 1.37 (H) 05/15/2023 1017    CREATININE 1.07 (H) 12/22/2015 1051      Component Value Date/Time   CALCIUM 10.1 05/15/2023 1017   ALKPHOS 62 05/15/2023 1017   AST 13 05/15/2023 1017   ALT 8 05/15/2023 1017   BILITOT 0.4 05/15/2023 1017   BILITOT <0.2 08/28/2022 1150       The ASCVD Risk score (Arnett DK, et al., 2019) failed to calculate for the following reasons:   The 2019 ASCVD risk score is only valid for ages 27 to 59  Assessment and Plan:   1. Diabetes, type 2: Worsened control per last A1c of 7.8% (05/15/23), increased from previous 7.0%. Reasonable less-stringent goal given significant comorbidity, <7.5-8% without hypoglycemia.  Current Regimen: metformin XR 500 mg daily, Ozempic 1 mg weekly  HCM: UACR Continue medications PAP Application for Ozempic initiated via [employer].   Future Consideration: SGLT2i: Reasonable consideration in the setting of renal disease. Potential for increased urination and worsening of current bladder symptoms, though less likely in the setting of euglycemia. If she qualifies for Ozempic PAP, likely eligible for Farxiga PAP.  Metformin:  Max dose 500 mg BID for renal dysfunction, though sugar has been well controlled on Ozempic. Optimization of renal-protective therapies preferred.       2. Healthcare Maintenance:  Pneumococcal - Current status: ***  Shingles - Current status: *** Influenza - Current status: ***  Due to receive the following vaccines: {LZAPIMMUNIZATION:31028}   Follow Up Follow up with clinical pharmacist via *** in *** weeks Patient given direct line for questions regarding medication therapy and MAP applications***  Future Appointments  Date Time Provider Department Center  05/23/2023  9:00 AM [facility] PHARMACIST [facility]  05/29/2023 11:40 AM Modesto Charon, NP [facility]  06/03/2023  7:40 AM [facility] DEVICE REMOTES [facility]  06/03/2023 11:20 AM Debbe Odea, MD [facility] None  07/08/2023  7:40 AM [facility] DEVICE  REMOTES [facility]  08/12/2023  7:40 AM [facility] DEVICE REMOTES [facility]  09/16/2023  7:40 AM [facility] DEVICE REMOTES [facility]  10/21/2023  7:40 AM [facility] DEVICE REMOTES [facility]    Loree Fee, PharmD Clinical Pharmacist [facility] [phone]

## 2023-05-23 NOTE — Progress Notes (Signed)
 Patient requested change regarding scheduled appointment for medication management. Left HIPAA compliant message for patient to return my call at their convenience.   Provided direct callback number.   Will attempt second outreach tomorrow if no response

## 2023-05-23 NOTE — Telephone Encounter (Signed)
 Copied from CRM [id number]. Topic: Appointments - Scheduling Inquiry for Clinic >> May 23, 2023  8:41 AM Courtney Pena wrote: Reason for CRM: Patient called in regarding phone appointment today with Pharmacist at 9 o'clock , stated she thought it was at 8 and may not be able to make it because she has to work, would like for someone to give her a callback later so she can reschedule

## 2023-05-27 DIAGNOSIS — I129 Hypertensive chronic kidney disease with stage 1 through stage 4 chronic kidney disease, or unspecified chronic kidney disease: Secondary | ICD-10-CM | POA: Diagnosis not present

## 2023-05-27 DIAGNOSIS — E1122 Type 2 diabetes mellitus with diabetic chronic kidney disease: Secondary | ICD-10-CM | POA: Diagnosis not present

## 2023-05-27 DIAGNOSIS — R350 Frequency of micturition: Secondary | ICD-10-CM | POA: Diagnosis not present

## 2023-05-27 DIAGNOSIS — E039 Hypothyroidism, unspecified: Secondary | ICD-10-CM | POA: Diagnosis not present

## 2023-05-27 DIAGNOSIS — R42 Dizziness and giddiness: Secondary | ICD-10-CM | POA: Diagnosis not present

## 2023-05-27 DIAGNOSIS — N1831 Chronic kidney disease, stage 3a: Secondary | ICD-10-CM | POA: Diagnosis not present

## 2023-05-27 DIAGNOSIS — E785 Hyperlipidemia, unspecified: Secondary | ICD-10-CM | POA: Diagnosis not present

## 2023-05-29 ENCOUNTER — Encounter: Payer: Self-pay | Admitting: Pharmacist

## 2023-05-29 ENCOUNTER — Encounter: Payer: Self-pay | Admitting: General Practice

## 2023-05-29 ENCOUNTER — Other Ambulatory Visit: Payer: Self-pay | Admitting: General Practice

## 2023-05-29 ENCOUNTER — Ambulatory Visit (INDEPENDENT_AMBULATORY_CARE_PROVIDER_SITE_OTHER): Admitting: General Practice

## 2023-05-29 VITALS — BP 156/82 | HR 80 | Temp 97.7°F | Ht 64.0 in | Wt 157.0 lb

## 2023-05-29 DIAGNOSIS — R350 Frequency of micturition: Secondary | ICD-10-CM

## 2023-05-29 DIAGNOSIS — E1122 Type 2 diabetes mellitus with diabetic chronic kidney disease: Secondary | ICD-10-CM

## 2023-05-29 DIAGNOSIS — Z7985 Long-term (current) use of injectable non-insulin antidiabetic drugs: Secondary | ICD-10-CM

## 2023-05-29 DIAGNOSIS — E1140 Type 2 diabetes mellitus with diabetic neuropathy, unspecified: Secondary | ICD-10-CM | POA: Diagnosis not present

## 2023-05-29 DIAGNOSIS — I10 Essential (primary) hypertension: Secondary | ICD-10-CM | POA: Diagnosis not present

## 2023-05-29 DIAGNOSIS — K2100 Gastro-esophageal reflux disease with esophagitis, without bleeding: Secondary | ICD-10-CM

## 2023-05-29 DIAGNOSIS — Z8739 Personal history of other diseases of the musculoskeletal system and connective tissue: Secondary | ICD-10-CM

## 2023-05-29 DIAGNOSIS — Z7984 Long term (current) use of oral hypoglycemic drugs: Secondary | ICD-10-CM | POA: Diagnosis not present

## 2023-05-29 DIAGNOSIS — E7800 Pure hypercholesterolemia, unspecified: Secondary | ICD-10-CM

## 2023-05-29 DIAGNOSIS — I480 Paroxysmal atrial fibrillation: Secondary | ICD-10-CM | POA: Diagnosis not present

## 2023-05-29 MED ORDER — DAPAGLIFLOZIN PROPANEDIOL 5 MG PO TABS: 5.0000 mg | ORAL_TABLET | Freq: Every day | ORAL | 0 refills | Status: DC

## 2023-05-29 MED ORDER — ALLOPURINOL 100 MG PO TABS
100.0000 mg | ORAL_TABLET | Freq: Every day | ORAL | 0 refills | Status: AC
Start: 2023-05-29 — End: ?

## 2023-05-29 MED ORDER — OMEPRAZOLE 40 MG PO CPDR
40.0000 mg | DELAYED_RELEASE_CAPSULE | Freq: Every day | ORAL | 1 refills | Status: DC
Start: 2023-05-29 — End: 2023-12-05

## 2023-05-29 MED ORDER — ROSUVASTATIN CALCIUM 10 MG PO TABS
10.0000 mg | ORAL_TABLET | Freq: Every day | ORAL | 1 refills | Status: AC
Start: 2023-05-29 — End: ?

## 2023-05-29 MED ORDER — METFORMIN HCL ER 500 MG PO TB24
500.0000 mg | ORAL_TABLET | Freq: Every day | ORAL | 1 refills | Status: DC
Start: 2023-05-29 — End: 2023-05-30

## 2023-05-29 MED ORDER — DILTIAZEM HCL ER COATED BEADS 360 MG PO CP24
360.0000 mg | ORAL_CAPSULE | Freq: Every day | ORAL | 1 refills | Status: DC
Start: 2023-05-29 — End: 2023-07-15

## 2023-05-29 MED ORDER — CARVEDILOL 12.5 MG PO TABS
12.5000 mg | ORAL_TABLET | Freq: Two times a day (BID) | ORAL | 1 refills | Status: DC
Start: 2023-05-29 — End: 2023-06-03

## 2023-05-29 NOTE — Telephone Encounter (Signed)
 Looks like the farxiga is not covered. Lillia Abed can you help this patient with patient assistance? I believe a pharmacy referral was done.

## 2023-05-29 NOTE — Patient Instructions (Addendum)
 Discussion with pharmacist notes:  Discuss eliquis patient assistance and ozempic patient assistance.  Also need to medication assistance for diabetes and high blood pressure. Let her know that I want to put you on jardiance or farxiga and both of those costs a lot as well.  Make her aware that you were working with Dr. Raymondo Band and he was helping with adjusting medications.   Follow up 2 weeks.  You will either be contacted via phone regarding your referral to urologist, or you may receive a letter on your MyChart portal from our referral team with instructions for scheduling an appointment. Please let us know if you have not been contacted by anyone within two weeks.  It was a pleasure to see you today!

## 2023-05-29 NOTE — Assessment & Plan Note (Signed)
 Uncontrolled. Hemoglobin A1c 7.8 on 05/15/2023.  Discussed medication options at length with patient.  Agreeable to start SGL 2. Prescription sent for Farxiga 5 mg once daily. Continue metformin 500 mg once daily.  Urgent referral placed for pharmacy to help with medication assistance.  Continue Ozempic 1 mg once weekly.  Urine ACR and foot exam are due and will be completed at next visit.

## 2023-05-29 NOTE — Assessment & Plan Note (Signed)
 Continue rosuvastatin 10 mg once daily.  Refill sent.  Will repeat lipid panel at next appointment.

## 2023-05-29 NOTE — Assessment & Plan Note (Signed)
 Uncontrolled with both readings.  Scheduled to see cardiology.  Given her kidney functions at this time continue Cardizem 360 mg once daily, carvedilol 12.5 twice daily.  Continue checking her blood pressures at home.  Continue Cardizem 30 mg as needed.  Refill provided on Cardizem 360 mg and carvedilol 12.5 mg. Follow-up in 2 weeks, consider increasing carvedilol to 25 mg.

## 2023-05-29 NOTE — Telephone Encounter (Signed)
 Rx is not covered and pharmacy is asking for alt rx. Was she getting a pharmacy referral?

## 2023-05-29 NOTE — Assessment & Plan Note (Addendum)
 Controlled. Continue continue Eliquis 5 mg twice daily, diltiazem 30 mg as needed.

## 2023-05-29 NOTE — Assessment & Plan Note (Signed)
 Continue allopurinol.  Refill sent.

## 2023-05-29 NOTE — Assessment & Plan Note (Signed)
 Uncontrolled. Unclear etiology.  Urine culture and urinalysis performed at urology on 05/27/2023.  No results in Care Everywhere.  Referral placed for urology.

## 2023-05-29 NOTE — Progress Notes (Signed)
 Established Patient Office Visit  Subjective   Patient ID: Courtney Pena, female    DOB: 26-Sep-1940  Age: 83 y.o. MRN: [id number]  Chief Complaint  Patient presents with   Urinary Frequency    Patient here to follow up on urinary issues. Patient was seen at kidney doctor yesterday but has not received any results yet. Patient has not gotten any better.    Medication Refill    Needs refills and wants to discuss some of them     HPI  Courtney Pena is a 83 year old female with past medical history of HTN, reflux, pulmonary nodule, hypothyroidism, type 2 DM, hypercholesteremia presents today to discuss urinary symptoms.   Urinary frequency: has been going on since September. She has had multiple U.A and urine culture which have been all within normal range.  She was evaluated on 05/15/2023 when she had a urine culture which showed less than 10,000 CFU per milliliter of single gram-positive organism isolated.  No further testing needs done and recommended to recollect.  She saw her nephrologist on 05/27/2023 who has repeated her urinalysis and urine culture.  Results are still pending although this.  As this has been going on since September the nephrologist has also recommended a referral to urologist.  She denies any visible blood in urine, fever, chills, nausea, vomiting or abdominal pain.  HTN: Diagnosed several years ago.  Currently managed on Cardizem 360 mg once daily, carvedilol 12.5 twice daily.  She has been checking her blood pressure at home and they range between 140s over 80s.  She denies any headaches, blurred vision, unilateral weakness.  Pharmacy consult was placed last time to help with polypharmacy.  She needs refill on her carvedilol and diltiazem.  Type 2 diabetes: Diagnosed several years ago.  Currently managed on metformin 500 mg daily.  Her previous PCP recommended switching to SGLT2 due to her renal functions.  Patient agreeable to the change.  She has  not been checking her blood sugars at home.  She denies any hypoglycemic events.  She needs refill on her metformin.  Gout: Diagnosed several years ago.  Currently managed on allopurinol 100 mg tablet once daily.  She needs a refill today.  Reflux esophagitis: Diagnosed several years ago.  Currently managed on omeprazole 40 mg once daily.  She would like a refill today.  Hypercholesterolemia: Diagnosed several years ago.  Currently managed on Crestor 10 mg once daily.  She would like a refill today.    Patient Active Problem List   Diagnosis Date Noted   Branch retinal vein occlusion of right eye with macular edema 05/15/2023   Itchy eyes 01/02/2023   History of gout 08/28/2022   Orthostatic hypotension 08/02/2022   Dizziness 07/02/2022   Subacute cough 07/02/2022   Right foot drop 05/07/2022   Tenosynovitis of left foot 10/19/2020   Hypercoagulable state due to paroxysmal atrial fibrillation (HCC) 10/03/2020   Rheumatoid arthritis (HCC) 03/21/2020   Tinnitus 03/21/2020   Idiopathic chronic gout of multiple sites without tophus 12/09/2019   Lumbar back pain with radiculopathy affecting left lower extremity 07/30/2019   Rotator cuff syndrome of right shoulder 11/24/2018   Hypertensive retinopathy of both eyes 10/14/2018   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 10/14/2018   Vitreomacular adhesion of left eye 10/14/2018   Diabetic retinopathy, nonproliferative, moderate (HCC) 03/07/2018   Chronic diastolic CHF (congestive heart failure) (HCC) 08/16/2017   Sudden hearing loss, left 09/25/2016   Chronic anticoag - Xarelto  05//2017, CHADS2CVASC=5 Eliquis 9/20 07/07/2015   Vitamin D deficiency 09/18/2013   Hyperlipidemia associated with type 2 diabetes mellitus (HCC) 08/22/2012   Pseudophakia, both eyes 03/12/2012   Presence of intraocular lens 03/12/2012   Pulmonary nodule 12/26/2010   Paroxysmal atrial fibrillation (HCC) 12/14/2010   Urinary frequency 07/13/2010    Hypothyroidism 04/25/2006   Type 2 diabetes mellitus with diabetic chronic kidney disease (HCC) 04/25/2006   HYPERCHOLESTEROLEMIA 04/25/2006   HYPERTENSION, BENIGN SYSTEMIC 04/25/2006   Coronary atherosclerosis 04/25/2006   Reflux esophagitis 04/25/2006   VERTIGO NOS OR DIZZINESS 04/25/2006   Past Medical History:  Diagnosis Date   Adenomatous polyp of colon 10/23/2021   Last colonoscopy was 02/2018 recommended 3 year follow up.       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  Did not tolerate zaralto for PAF.  Seems to tolerate ASA.Marland Kitchen  Likely needs long term iron to keep up with ongoing loss.     Arthritis    Atrial flutter (HCC) 12/07/2010   converted in ED with 300 mg flecainide   CAD (coronary artery disease)    a. mild per cath in 2004;  b. nonischemic Myoview in March 2012;  c. Lex MV 1/14:  EF 66%, no ischemia   Cataract    Cold intolerance of hand 12/18/2021   Degenerative arthritis of thumb 02/12/2014   Disequilibrium 04/25/2006   Formatting of this note might be different from the original. Formatting of this note might be different from the original. Qualifier: Diagnosis of By: Leveda Anna MD, Chrissie Noa Last Assessment & Plan: Formatting of this note might be different from the original. Now describing ear fullness more on left side.  Perhaps the removal of cerumen impaction will help.  No further WU given previously seen by ENT   Diverticulosis of colon 04/25/2006   Qualifier: Diagnosis of   By: Leveda Anna MD, Chrissie Noa      Replacing diagnoses that were inactivated after the 05/28/22 regulatory import     DJD, UNSPECIFIED 04/25/2006   Qualifier: Diagnosis of   By: Leveda Anna MD, Chrissie Noa      Replacing diagnoses that were inactivated after the 05/28/22 regulatory import     Dysrhythmia    A-Fib. 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     Hyperlipidemia    Hypertension    Hypothyroidism    Neuromuscular disorder (HCC)    neuropathy in feet   Obesity    Personal history of colonic polyps 06/06/2009   cecal polyp   Rheumatoid arthritis (HCC)    Shingles 2023   Sleep apnea    lost weight no longer needs   Type 2 diabetes mellitus with diabetic chronic kidney disease (HCC) 04/25/2006       Past Surgical History:  Procedure Laterality Date   ARTERY BIOPSY N/A 08/31/2021   Procedure: LEFT TEMPORAL ARTERY BIOPSY;  Surgeon: Abigail Miyamoto, MD;  Location: [facility];  Service: General;  Laterality: N/A;  BREAST BIOPSY Left    BREAST EXCISIONAL BIOPSY Left    benign   CARDIAC CATHETERIZATION  1999&2004   CARPAL TUNNEL RELEASE Bilateral 2003   CATARACT EXTRACTION Bilateral 1990   CHOLECYSTECTOMY N/A 07/05/2021   Procedure: LAPAROSCOPIC CHOLECYSTECTOMY;  Surgeon: Quentin Ore, MD;  Location: [facility];  Service: General;  Laterality: N/A;   COLONOSCOPY     ESOPHAGOGASTRODUODENOSCOPY  08/13/2011   Procedure: ESOPHAGOGASTRODUODENOSCOPY (EGD);  Surgeon: Iva Boop, MD;  Location: [facility] ENDOSCOPY;  Service: Endoscopy;  Laterality: N/A;   implantable loop recorder placement  07/20/2020   Medtronic Reveal Linq model LNQ 22 ([place] [id number]) implantable loop recorder by Dr Johney Frame   Allergies  Allergen Reactions   Actos [Pioglitazone] Swelling    Swelling all over body and moonface   Lisinopril Swelling    Swelling, may have had some breathing involvement. Angioedema   Nsaids Other (See Comments)    Patient reports internal bleeding. Experienced with tolmetin.    Penicillins  Shortness Of Breath and Swelling    Arm Swelling with Penicillin (Occurred in 1960s) Breathing - throat swelling with Amoxicillin (Occurred prior to 2002)   Sulfamethoxazole Hives and Itching    "welps all over" immediately after dose   Flecainide Other (See Comments)    Blurry  vision   Hctz [Hydrochlorothiazide] Other (See Comments)    Gout   Iodinated Contrast Media Itching    Pt. Developed mild itching after receiving IV cm; pt. Held; Dr. Sheldon Silvan recomended she take 50 mg of benadryl when she goes home-if necessary; Dr Marene Lenz recommends benadryl prior to future exams requiring contrast media, but stated other doctors may recommend another premedication prep.   Prednisone Other (See Comments)    Agitation, vivid dreams, anxiety does not want to take again.    Statins Other (See Comments)    Muscle aches with multiple statins. Able to tolerate rosuvastatin.   Gabapentin Other (See Comments)    Caused dysphoria    Losartan Potassium Swelling    Lower extremity swelling   Zetia [Ezetimibe] Other (See Comments)    Cramps         05/15/2023   10:08 AM 03/08/2023   10:52 AM 02/15/2023   11:07 AM  Depression screen PHQ 2/9  Decreased Interest 0 0 0  Down, Depressed, Hopeless 0 0 0  PHQ - 2 Score 0 0 0  Altered sleeping 0 0 0  Tired, decreased energy 0 0 0  Change in appetite 0 0 0  Feeling bad or failure about yourself  0 0 0  Trouble concentrating 0 0 0  Moving slowly or fidgety/restless 0 0 0  Suicidal thoughts 0 0 0  PHQ-9 Score 0 0 0  Difficult doing work/chores Not difficult at all         05/15/2023   10:08 AM  GAD 7 : Generalized Anxiety Score  Nervous, Anxious, on Edge 0  Control/stop worrying 0  Worry too much - different things 0  Trouble relaxing 0  Restless 0  Easily annoyed or irritable 0  Afraid - awful might happen 0  Total GAD 7 Score 0  Anxiety Difficulty Not difficult at all      ROS    Objective:     BP (!) 156/82 (BP Location: Left  Arm, Patient Position: Sitting, Cuff Size: Normal)   Pulse 80   Temp 97.7 F (36.5 C) (Oral)   Ht 5\' 4"  (1.626 m)   Wt 157 lb (71.2 kg)   SpO2  97%   BMI 26.95 kg/m  BP Readings from Last 3 Encounters:  05/29/23 (!) 156/82  05/15/23 134/80  03/22/23 136/84   Wt Readings from Last 3 Encounters:  05/29/23 157 lb (71.2 kg)  05/15/23 159 lb (72.1 kg)  03/22/23 159 lb (72.1 kg)      Physical Exam   No results found for any visits on 05/29/23.     The ASCVD Risk score (Arnett DK, et al., 2019) failed to calculate for the following reasons:   The 2019 ASCVD risk score is only valid for ages 59 to 71    Assessment & Plan:  Type 2 diabetes mellitus with chronic kidney disease, without long-term current use of insulin, unspecified CKD stage (HCC) Assessment & Plan: Uncontrolled. Hemoglobin A1c 7.8 on 05/15/2023.  Discussed medication options at length with patient.  Agreeable to start SGL 2. Prescription sent for Farxiga 5 mg once daily. Continue metformin 500 mg once daily.  Urgent referral placed for pharmacy to help with medication assistance.  Continue Ozempic 1 mg once weekly.  Urine ACR and foot exam are due and will be completed at next visit.   Orders: -     Dapagliflozin Propanediol; Take 1 tablet (5 mg total) by mouth daily before breakfast.  Dispense: 30 tablet; Refill: 0  HYPERTENSION, BENIGN SYSTEMIC Assessment & Plan: Uncontrolled with both readings.  Scheduled to see cardiology.  Given her kidney functions at this time continue Cardizem 360 mg once daily, carvedilol 12.5 twice daily.  Continue checking her blood pressures at home.  Continue Cardizem 30 mg as needed.  Refill provided on Cardizem 360 mg and carvedilol 12.5 mg. Follow-up in 2 weeks, consider increasing carvedilol to 25 mg.   Orders: -     Carvedilol; Take 1 tablet (12.5 mg total) by mouth 2 (two) times daily with a meal.  Dispense: 180 tablet; Refill: 1 -     dilTIAZem HCl ER Coated  Beads; Take 1 capsule (360 mg total) by mouth daily.  Dispense: 90 capsule; Refill: 1  Urinary frequency Assessment & Plan: Uncontrolled. Unclear etiology.  Urine culture and urinalysis performed at urology on 05/27/2023.  No results in Care Everywhere.  Referral placed for urology.  Orders: -     Ambulatory referral to Urology  History of gout Assessment & Plan: Continue allopurinol.  Refill sent.  Orders: -     Allopurinol; Take 1 tablet (100 mg total) by mouth daily.  Dispense: 90 tablet; Refill: 0  Type 2 diabetes mellitus with diabetic neuropathy, without long-term current use of insulin (HCC) -     metFORMIN HCl ER; Take 1 tablet (500 mg total) by mouth daily with breakfast. Take 500 mg once daily due to decreased renal function  Dispense: 90 tablet; Refill: 1  HYPERCHOLESTEROLEMIA Assessment & Plan: Continue rosuvastatin 10 mg once daily.  Refill sent.  Will repeat lipid panel at next appointment.  Orders: -     Rosuvastatin Calcium; Take 1 tablet (10 mg total) by mouth daily.  Dispense: 90 tablet; Refill: 1  Paroxysmal atrial fibrillation (HCC) Assessment & Plan: Controlled. Continue continue Eliquis 5 mg twice daily, diltiazem 30 mg as needed.   Gastroesophageal reflux disease with esophagitis without hemorrhage -     Omeprazole; Take 1 capsule (40 mg total) by mouth daily.  Dispense: 90 capsule; Refill: 1     Return in about 2 weeks (around 06/12/2023) for chronic care management.[place] Charon, NP

## 2023-05-30 ENCOUNTER — Other Ambulatory Visit: Admitting: Pharmacist

## 2023-05-30 ENCOUNTER — Other Ambulatory Visit: Payer: Self-pay | Admitting: Pharmacist

## 2023-05-30 ENCOUNTER — Encounter: Payer: Self-pay | Admitting: Pharmacist

## 2023-05-30 DIAGNOSIS — E1122 Type 2 diabetes mellitus with diabetic chronic kidney disease: Secondary | ICD-10-CM

## 2023-05-30 DIAGNOSIS — I5032 Chronic diastolic (congestive) heart failure: Secondary | ICD-10-CM

## 2023-05-30 NOTE — Progress Notes (Cosign Needed)
   05/30/2023 Name: Courtney Pena MRN: [id number] DOB: 04-14-1940  Subjective  Chief Complaint  Patient presents with   Medication Access   Care Team: Primary Care Provider: Mod[place] Charon, NP  Reason for visit: ?  Courtney Pena is a 83 y.o. female who presents today for a telephone visit with the pharmacist due to medication access concerns.  HPI: Patient has been receiving Ozempic through [employer], initially enrolled through her [facility] PCP. She has transitioned care to [facility] and would like assistance getting her Novo application resituated under her new PCP with delivery of medication to [facility].   Patient has also recently been prescribed Farxiga and would like to see if there is medication assistance options for her.    Medication Access: ?  Denies missed doses of medication due to cost at this time.   Prescription drug coverage: YES Payor: Multimedia programmer / Plan: UHC MEDICARE / Product Type: *No Product type* /   Current Patient Assistance: Novo Nordisk [employer])  Patient lives in a household of 1 with an estimated combined monthly income of ~$100 + $2200  via Dance movement psychotherapist and Social Security Retirement.  Medicare LIS Eligible: No ; Income exceeded  Diabetes: Reports doing well overall.Feels confident that her recent A1c increase was 2/2 dietary changes. Has ~3 months of Ozempic remaining at home (1.0 mg).  Has not started taking metformin 500 mg daily at this time. Reports it is ready to pick up at her pharmacy.  Assessment and Plan:   1. Medication Access NovoNordisk Provider change request form completed. Uploaded to PCP eFax folder for review/signature.  AZ&Me application initiated for [place]. Patient plans to sign in office tomorrow.  PAP details documented in separate documentation encounter.   2. Diabetes Marcelline Deist application started today, expect a couple weeks before approval/medication delivery. Given change in  kidney function, May be able to get a 24-month free trial of Jardiance to bridge her until Marcelline Deist is delivered (instead of having her resume metformin). Will discuss preferences with PCP. A1c historically very well controlled on current regimen/dose of Ozempic. Will plan for continuation of Ozempic 1.0 mg dose on re-order form.  Farxiga 10 mg daily (AZ&Me) Consider free trial card for Jardiance 10 mg daily to bridge to [place] approval/shipment in place of metformin resumption   Future Appointments  Date Time Provider Department Center  06/03/2023  7:40 AM [facility] DEVICE REMOTES [facility]  06/03/2023 11:20 AM Debbe Odea, MD [facility] None  06/12/2023 12:20 PM Modesto Charon, NP [facility]  07/02/2023 11:30 AM [facility] ANNUAL WELLNESS VISIT 2 [facility]  07/08/2023  7:40 AM [facility] DEVICE REMOTES [facility]  07/08/2023 10:00 AM Lonna Cobb, Verna Czech, MD [facility] None  08/12/2023  7:40 AM [facility] DEVICE REMOTES [facility]  09/16/2023  7:40 AM [facility] DEVICE REMOTES [facility]  10/21/2023  7:40 AM [facility] DEVICE REMOTES [facility]    Loree Fee, PharmD Clinical Pharmacist [facility] [phone]

## 2023-05-30 NOTE — Progress Notes (Addendum)
 Patient Assistance Program (PAP) Application   Manufacturer: [employer] (AZ&Me)    (New enrollment) Medication(s): Farxiga 10 mg  Patient Portion of Application:  05/30/23: Filled out and uploaded to clinic eFax folder for patient signature in front office. Plans to sign 05/31/23 Income Documentation: N/A - Electronic verification elected.  Provider Portion of Application:  4/3:  PharmD completed, kept with patient pages Prescription(s): Included in MAP application. Farxiga 10 mg daily #90, 56yr refill  4/15: Called AZ&Me. They report application has been approved 06/03/23, 02/26/2024 --- First shipment went out yesterday   APPROVED 4/7 - 02/26/2024  Next Steps: [x]    Application filled out and uploaded to eFax folder for patient signature in front office 05/31/23 [x]    Once patient signs, front office to place application in PCP folder [x]    Upon PCP signature Application to be faxed to AZ&Me Fax: [phone] AND scanned into chart    Patient Assistance Program (PAP) Application   Manufacturer: Novo Nordisk    (Currently enrolled  -  dose change request) - PCP change Medication(s): Ozempic 1.0 mg   Per Novo, Next refill due 06/28/23  Novo Refill/Reorder/Change Request Form Filled out by PharmD 05/30/23, uploaded to PCP eFax folder for review-signature   Next Steps: [x]    Form filled out and uploaded to PCP eFax folder for review/signature [x]    Upon PCP signature Application to be faxed to NovoNordisk Fax: [phone] and scanned into patient chart  Forwarded to [facility] Patient Advocate Team for future correspondences/re-enrollment.  Note routed to PCP Clinic Pool to ensure PCP signature is obtained and application is faxed.  *[facility] clinic team - Please Addend/update this note as the "Next Steps" are completed in office*

## 2023-05-30 NOTE — Progress Notes (Signed)
 Called patient to schedule medication assistance visit. She is driving at the time of call though would like a call back after 2:30 pm today.

## 2023-05-31 NOTE — Progress Notes (Signed)
 So the [place] one came directly to the provider folder and did not have a page for patient to sign; it was only three pages. I have not seen anything from Novo unless its in the front folder; I dont have access to that folder to confirm.

## 2023-05-31 NOTE — Progress Notes (Addendum)
 I received the Az&me forms in [place] folder for her to sign; I have also faxed this back signed. I have not received any novo ppw; was this sent to the front staff or me? I didn't see where the patient needed to sign the Az&me forms so I'm confused but the message on what all is needed if anything as I just faxed them back. Can you clarify about the novo ppw?

## 2023-05-31 NOTE — Progress Notes (Signed)
 Novo ppw received and given to provider to sign. Will fax back once done.

## 2023-05-31 NOTE — Progress Notes (Signed)
 Novo ppw has been faxed. Awaiting new set of ppw for [place] to be sent over for patient and provider to sign.

## 2023-05-31 NOTE — Telephone Encounter (Signed)
 Patient signed form placed in providers box for signature

## 2023-06-03 ENCOUNTER — Ambulatory Visit (INDEPENDENT_AMBULATORY_CARE_PROVIDER_SITE_OTHER): Payer: Medicare Other

## 2023-06-03 ENCOUNTER — Encounter: Payer: Self-pay | Admitting: Cardiology

## 2023-06-03 ENCOUNTER — Ambulatory Visit: Payer: Medicare Other | Attending: Cardiology | Admitting: Cardiology

## 2023-06-03 VITALS — BP 124/74 | HR 65 | Ht 64.0 in | Wt 159.6 lb

## 2023-06-03 DIAGNOSIS — I480 Paroxysmal atrial fibrillation: Secondary | ICD-10-CM

## 2023-06-03 DIAGNOSIS — I10 Essential (primary) hypertension: Secondary | ICD-10-CM | POA: Diagnosis not present

## 2023-06-03 MED ORDER — EMPAGLIFLOZIN 10 MG PO TABS: 10.0000 mg | ORAL_TABLET | Freq: Every day | ORAL | 0 refills | Status: DC

## 2023-06-03 NOTE — Telephone Encounter (Signed)
 Az&me has also been faxed and sent to scan

## 2023-06-03 NOTE — Progress Notes (Signed)
 Cardiology Office Note:    Date:  06/03/2023   ID:  Courtney Pena, DOB 02-May-1940, MRN [id number]  PCP:  Modesto Charon, NP   [facility] Providers Cardiologist:  None Electrophysiologist:  Will Jorja Loa, MD     Referring MD: No ref. provider found   No chief complaint on file.   History of Present Illness:    Courtney Pena is a 83 y.o. female with a hx of hypertension, hyperlipidemia, diabetes, A-fib/flutter on Eliquis who presents for follow-up and to establish care  Previously evaluated from a cardiac perspective at our [facility] practice.  Switching to [facility] for easier commute.  She has an ILR in place which has shown low A-fib burden.  Last documented A-fib episode was December 2022.  Takes Eliquis 5 mg twice daily no bleeding issues.  Also takes Cardizem CD 360 mg daily.  Was previously on carvedilol, has not taken carvedilol over the past 2 months.  Recently saw her primary care physician who recommended she restarts carvedilol.  States her blood pressure at home ranges in the 160s at times.  She denies palpitations.  Endorses occasional dizziness when she moves from sitting to standing.  Prior notes/testing Cardiac PET/2024 normal perfusion with no significant ischemia. Echo 12/2021 EF 65 to 70% Stopped flecainide due to blurred vision.  Deferred ablation or propafenone. Previously did not tolerate Xarelto.  Past Medical History:  Diagnosis Date   Adenomatous polyp of colon 10/23/2021   Last colonoscopy was 02/2018 recommended 3 year follow up.       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  Did not tolerate zaralto for PAF.  Seems to tolerate ASA.Marland Kitchen  Likely needs long term iron to keep up with ongoing loss.     Arthritis    Atrial flutter (HCC) 12/07/2010    converted in ED with 300 mg flecainide   CAD (coronary artery disease)    a. mild per cath in 2004;  b. nonischemic Myoview in March 2012;  c. Lex MV 1/14:  EF 66%, no ischemia   Cataract    Cold intolerance of hand 12/18/2021   Degenerative arthritis of thumb 02/12/2014   Disequilibrium 04/25/2006   Formatting of this note might be different from the original. Formatting of this note might be different from the original. Qualifier: Diagnosis of By: Leveda Anna MD, Chrissie Noa Last Assessment & Plan: Formatting of this note might be different from the original. Now describing ear fullness more on left side.  Perhaps the removal of cerumen impaction will help.  No further WU given previously seen by ENT   Diverticulosis of colon 04/25/2006   Qualifier: Diagnosis of   By: Leveda Anna MD, Chrissie Noa      Replacing diagnoses that were inactivated after the 05/28/22 regulatory import     DJD, UNSPECIFIED 04/25/2006   Qualifier: Diagnosis of   By: Leveda Anna MD, Chrissie Noa      Replacing diagnoses that were inactivated after the 05/28/22 regulatory import     Dysrhythmia    A-Fib. 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     Hyperlipidemia    Hypertension    Hypothyroidism    Neuromuscular disorder (HCC)    neuropathy in feet   Obesity    Personal history of colonic polyps 06/06/2009   cecal polyp   Rheumatoid arthritis (HCC)    Shingles 2023   Sleep apnea    lost weight no longer needs   Type 2 diabetes mellitus with diabetic chronic kidney disease (HCC) 04/25/2006        Past Surgical History:  Procedure Laterality Date   ARTERY BIOPSY N/A 08/31/2021   Procedure: LEFT  TEMPORAL ARTERY BIOPSY;  Surgeon: Abigail Miyamoto, MD;  Location: [facility];  Service: General;  Laterality: N/A;   BREAST BIOPSY Left    BREAST EXCISIONAL BIOPSY Left    benign   CARDIAC CATHETERIZATION  1999&2004   CARPAL TUNNEL RELEASE Bilateral 2003   CATARACT EXTRACTION Bilateral 1990   CHOLECYSTECTOMY N/A 07/05/2021   Procedure: LAPAROSCOPIC CHOLECYSTECTOMY;  Surgeon: Quentin Ore, MD;  Location: [facility];  Service: General;  Laterality: N/A;   COLONOSCOPY     ESOPHAGOGASTRODUODENOSCOPY  08/13/2011   Procedure: ESOPHAGOGASTRODUODENOSCOPY (EGD);  Surgeon: Iva Boop, MD;  Location: [facility] END[facility]COPY;  Service: Endoscopy;  Laterality: N/A;   implantable loop recorder placement  07/20/2020   Medtronic Reveal Linq model LNQ 22 ([place] [id number]) implantable loop recorder by Dr Johney Frame    Current Medications: Current Meds  Medication Sig   allopurinol (ZYLOPRIM) 100 MG tablet Take 1 tablet (100 mg total) by mouth daily.   apixaban (ELIQUIS) 5 MG TABS tablet Take 5 mg by mouth 2 (two) times daily.   Apoaequorin (PREVAGEN PO) Take 1 tablet by mouth daily.   blood glucose meter kit and supplies Dispense based on patient and insurance preference. Use up to four times daily as directed. (FOR ICD-10 E10.9, E11.9).   Coenzyme Q10 (COQ10) 100 MG CAPS Take 100 mg by mouth daily.   diclofenac Sodium (VOLTAREN) 1 % GEL Apply 4 g topically 4 (four) times daily.   diltiazem (CARDIZEM CD) 360 MG 24 hr capsule Take 1 capsule (360 mg total) by mouth daily.   diltiazem (CARDIZEM) 30 MG tablet One tab by mouth every six hours as needed for atrial fib   glucose blood (ONETOUCH ULTRA) test strip 1 Container by Other route every morning.   Insulin Pen Needle 31G X 8 MM MISC Use with pen to inject once daily.   Lancets (ONETOUCH ULTRASOFT) lancets Use to check blood sugar daily.   levothyroxine (SYNTHROID) 112 MCG tablet Take 1 tablet (112 mcg total) by mouth daily before breakfast.   meclizine (ANTIVERT)  25 MG tablet TAKE 1 TABLET (25 MG TOTAL) BY MOUTH AS NEEDED.   metFORMIN (GLUCOPHAGE) 500 MG tablet Take by mouth 2 (two) times daily with a meal.   Multiple Vitamin (MULTI-VITAMINS) TABS Take 1 tablet by mouth daily.   nitroGLYCERIN (NITROSTAT) 0.4 MG SL tablet Place 1 tablet (0.4 mg total) under the tongue every 5 (five) minutes as needed for chest pain (Call 911 if chest pain after three doses).   omeprazole (PRILOSEC) 40 MG capsule Take 1 capsule (40 mg total) by mouth daily.   ONETOUCH ULTRA test strip CHECK BLOOD SUGAR 3 TIMES  DAILY   rosuvastatin (CRESTOR) 10 MG tablet Take 1 tablet (10 mg total) by mouth daily.   VITAMIN D, CHOLECALCIFEROL, PO Take by mouth. 4000mg    [  DISCONTINUED] carvedilol (COREG) 12.5 MG tablet Take 1 tablet (12.5 mg total) by mouth 2 (two) times daily with a meal.     Allergies:   Actos [pioglitazone], Lisinopril, Nsaids, Penicillins, Sulfamethoxazole, Flecainide, Hctz [hydrochlorothiazide], Iodinated contrast media, Prednisone, Statins, Gabapentin, Losartan potassium, and Zetia [ezetimibe]   Social History   Socioeconomic History   Marital status: Widowed    Spouse name: Greggory Stallion   Number of children: 4   Years of education: some colle   Highest education level: Not on file  Occupational History   Occupation: Management    Employer: UNEMPLOYED  Tobacco Use   Smoking status: Former    Current packs/day: 0.00    Average packs/day: 2.0 packs/day for 30.0 years (60.0 ttl pk-yrs)    Types: Cigarettes    Start date: 02/27/1963    Quit date: 02/26/1993    Years since quitting: 30.2    Passive exposure: Past   Smokeless tobacco: Never   Tobacco comments:    Former smoker 03/21/22  Vaping Use   Vaping status: Never Used  Substance and Sexual Activity   Alcohol use: No    Alcohol/week: 0.0 standard drinks of alcohol   Drug use: No   Sexual activity: Not Currently  Other Topics Concern   Not on file  Social History Narrative   Not on file   Social  Drivers of Health   Financial Resource Strain: Not on file  Food Insecurity: No Food Insecurity (01/05/2022)   Hunger Vital Sign    Worried About Running Out of Food in the Last Year: Never true    Ran Out of Food in the Last Year: Never true  Transportation Needs: No Transportation Needs (01/05/2022)   PRAPARE - Administrator, Civil Service (Medical): No    Lack of Transportation (Non-Medical): No  Physical Activity: Not on file  Stress: Not on file  Social Connections: Not on file     Family History: The patient's family history includes Colon polyps in her daughter; Diabetes in her daughter and maternal grandfather; Heart disease in her father; Hypertension in her mother; Other in her mother; Thyroid disease in her daughter. There is no history of Colon cancer, Esophageal cancer, Stomach cancer, or Rectal cancer.  ROS:   Please see the history of present illness.     All other systems reviewed and are negative.  EKGs/Labs/Other Studies Reviewed:    The following studies were reviewed today:       Recent Labs: 10/23/2022: TSH 4.290 01/12/2023: Hemoglobin 9.5; Platelets 199 05/15/2023: ALT 8; BUN 32; Creatinine, Ser 1.37; Potassium 4.2; Sodium 136  Recent Lipid Panel    Component Value Date/Time   CHOL 109 02/22/2022 1102   TRIG 107 02/22/2022 1102   HDL 61 02/22/2022 1102   CHOLHDL 1.8 02/22/2022 1102   CHOLHDL 4.7 01/27/2015 1238   VLDL 28 01/27/2015 1238   LDLCALC 29 02/22/2022 1102   LDLDIRECT 62 11/24/2018 1211   LDLDIRECT 178 (H) 05/12/2015 1229     Risk Assessment/Calculations:             Physical Exam:    VS:  BP 124/74   Pulse 65   Ht 5\' 4"  (1.626 m)   Wt 159 lb 9.6 oz (72.4 kg)   SpO2 97%   BMI 27.40 kg/m     Wt Readings from Last 3 Encounters:  06/03/23 159 lb 9.6 oz (72.4 kg)  05/29/23 157 lb (71.2 kg)  05/15/23 159 lb (72.1 kg)  GEN:  Well nourished, well developed in no acute distress HEENT: Normal NECK: No JVD; No  carotid bruits CARDIAC: RRR, no murmurs, rubs, gallops RESPIRATORY:  Clear to auscultation without rales, wheezing or rhonchi  ABDOMEN: Soft, non-tender, non-distended MUSCULOSKELETAL:  No edema; No deformity  SKIN: Warm and dry NEUROLOGIC:  Alert and oriented x 3 PSYCHIATRIC:  Normal affect   ASSESSMENT:    1. Paroxysmal atrial fibrillation (HCC)   2. Primary hypertension    PLAN:    In order of problems listed above:  Paroxysmal atrial fibrillation, currently in sinus rhythm.  Continue diltiazem 360 mg daily, Eliquis 5 mg twice daily.  BP normal, heart rate 65.  Hold carvedilol for now.  Obtain echo.  Establish care with EP/A-fib clinic.  Has ILR in place. Hypertension, blood pressure controlled.  States BP is elevated at home.  Continue diltiazem 360 mg daily.  Check BP at home and keep log, will consider adding carvedilol if BP is elevated.  Home BP machine likely not accurate. Hyperlipidemia, tolerating low-dose Crestor.  Continue Crestor 10 mg daily.      Medication Adjustments/Labs and Tests Ordered: Current medicines are reviewed at length with the patient today.  Concerns regarding medicines are outlined above.  Orders Placed This Encounter  Procedures   ECHOCARDIOGRAM COMPLETE   No orders of the defined types were placed in this encounter.   Patient Instructions  Medication Instructions: Your physician recommends the following medication changes.  STOP TAKING: Carvedilol (coreg)  *If you need a refill on your cardiac medications before your next appointment, please call your pharmacy*  Lab Work: No labs ordered today   Testing/Procedures: Your physician has requested that you have an echocardiogram. Echocardiography is a painless test that uses sound waves to create images of your heart. It provides your doctor with information about the size and shape of your heart and how well your heart's chambers and valves are working.   You may receive an ultrasound  enhancing agent through an IV if needed to better visualize your heart during the echo. This procedure takes approximately one hour.  There are no restrictions for this procedure.  This will take place at [address] [facility] [address]) [address], [place] [address]  Please note: We ask at that you not bring children with you during ultrasound (echo/ vascular) testing. Due to room size and safety concerns, children are not allowed in the ultrasound rooms during exams. Our front office staff cannot provide observation of children in our lobby area while testing is being conducted. An adult accompanying a patient to their appointment will only be allowed in the ultrasound room at the discretion of the ultrasound technician under special circumstances. We apologize for any inconvenience.    Follow-Up: At [facility], you and your health needs are our priority.  As part of our continuing mission to provide you with exceptional heart care, our providers are all part of one team.  This team includes your primary Cardiologist (physician) and Advanced Practice Providers or APPs (Physician Assistants and Nurse Practitioners) who all work together to provide you with the care you need, when you need it.  Your next appointment:   2-3 month(s)  Provider:   You may see Debbe Odea, MD or one of the following Advanced Practice Providers on your designated Care Team:   Nicolasa Ducking, NP Ames Dura, PA-C Eula Listen, PA-C Cadence Rockwall, PA-C Charlsie Quest, NP Carlos Levering, NP    We recommend signing up for  the patient portal called "MyChart".  Sign up information is provided on this After Visit Summary.  MyChart is used to connect with patients for Virtual Visits (Telemedicine).  Patients are able to view lab/test results, encounter notes, upcoming appointments, etc.  Non-urgent messages can be sent to your provider as well.   To learn more about what you can do with MyChart,  go to [email or web address].   Other Instructions Please schedule a follow up appointment with the afib clinic. You are due for a visit.          Signed, Debbe Odea, MD  06/03/2023 12:26 PM    [facility]

## 2023-06-03 NOTE — Progress Notes (Signed)
 [facility] Summary Report / Loop Recorder

## 2023-06-03 NOTE — Progress Notes (Signed)
 Az&Me ppw has been faxed.

## 2023-06-03 NOTE — Telephone Encounter (Signed)
Form placed in providers folder to sign

## 2023-06-03 NOTE — Patient Instructions (Addendum)
 Medication Instructions: Your physician recommends the following medication changes.  STOP TAKING: Carvedilol (coreg)  *If you need a refill on your cardiac medications before your next appointment, please call your pharmacy*  Lab Work: No labs ordered today   Testing/Procedures: Your physician has requested that you have an echocardiogram. Echocardiography is a painless test that uses sound waves to create images of your heart. It provides your doctor with information about the size and shape of your heart and how well your heart's chambers and valves are working.   You may receive an ultrasound enhancing agent through an IV if needed to better visualize your heart during the echo. This procedure takes approximately one hour.  There are no restrictions for this procedure.  This will take place at [address] [facility] [address]) [address], [place] [address]  Please note: We ask at that you not bring children with you during ultrasound (echo/ vascular) testing. Due to room size and safety concerns, children are not allowed in the ultrasound rooms during exams. Our front office staff cannot provide observation of children in our lobby area while testing is being conducted. An adult accompanying a patient to their appointment will only be allowed in the ultrasound room at the discretion of the ultrasound technician under special circumstances. We apologize for any inconvenience.    Follow-Up: At [facility], you and your health needs are our priority.  As part of our continuing mission to provide you with exceptional heart care, our providers are all part of one team.  This team includes your primary Cardiologist (physician) and Advanced Practice Providers or APPs (Physician Assistants and Nurse Practitioners) who all work together to provide you with the care you need, when you need it.  Your next appointment:   2-3 month(s)  Provider:   You may see Debbe Odea, MD or  one of the following Advanced Practice Providers on your designated Care Team:   Nicolasa Ducking, NP Ames Dura, PA-C Eula Listen, PA-C Cadence Wildwood, PA-C Charlsie Quest, NP Carlos Levering, NP    We recommend signing up for the patient portal called "MyChart".  Sign up information is provided on this After Visit Summary.  MyChart is used to connect with patients for Virtual Visits (Telemedicine).  Patients are able to view lab/test results, encounter notes, upcoming appointments, etc.  Non-urgent messages can be sent to your provider as well.   To learn more about what you can do with MyChart, go to [email or web address].   Other Instructions Please schedule a follow up appointment with the afib clinic. You are due for a visit.

## 2023-06-04 LAB — CUP PACEART REMOTE DEVICE CHECK
Date Time Interrogation Session: 20250406230507
Implantable Pulse Generator Implant Date: 20220525

## 2023-06-09 ENCOUNTER — Encounter: Payer: Self-pay | Admitting: Cardiovascular Disease

## 2023-06-12 ENCOUNTER — Other Ambulatory Visit (INDEPENDENT_AMBULATORY_CARE_PROVIDER_SITE_OTHER): Payer: Self-pay | Admitting: Pharmacist

## 2023-06-12 ENCOUNTER — Ambulatory Visit: Admitting: General Practice

## 2023-06-12 DIAGNOSIS — E1169 Type 2 diabetes mellitus with other specified complication: Secondary | ICD-10-CM

## 2023-06-12 DIAGNOSIS — H43822 Vitreomacular adhesion, left eye: Secondary | ICD-10-CM | POA: Diagnosis not present

## 2023-06-12 DIAGNOSIS — E1122 Type 2 diabetes mellitus with diabetic chronic kidney disease: Secondary | ICD-10-CM

## 2023-06-12 DIAGNOSIS — H43813 Vitreous degeneration, bilateral: Secondary | ICD-10-CM | POA: Diagnosis not present

## 2023-06-12 DIAGNOSIS — H34831 Tributary (branch) retinal vein occlusion, right eye, with macular edema: Secondary | ICD-10-CM | POA: Diagnosis not present

## 2023-06-12 DIAGNOSIS — E113213 Type 2 diabetes mellitus with mild nonproliferative diabetic retinopathy with macular edema, bilateral: Secondary | ICD-10-CM | POA: Diagnosis not present

## 2023-06-12 DIAGNOSIS — E7800 Pure hypercholesterolemia, unspecified: Secondary | ICD-10-CM

## 2023-06-12 DIAGNOSIS — I5032 Chronic diastolic (congestive) heart failure: Secondary | ICD-10-CM

## 2023-06-12 DIAGNOSIS — I10 Essential (primary) hypertension: Secondary | ICD-10-CM

## 2023-06-12 NOTE — Progress Notes (Signed)
 06/12/2023 Name: Courtney Pena MRN: [id number] DOB: [birth year]  Subjective  Chief Complaint  Patient presents with   Diabetes   Congestive Heart Failure   Hypertension   Hyperlipidemia   Reason for visit: ?  Courtney Pena is a 83 y.o. female with a history of diabetes (type 2), who presents today for an initial pharmacotherapy visit to review chronic conditions.? Pertinent PMH includes DM2, HTN, CHF, AF, HLD, orthostatic hypotension, hypothyroid, urinary frequency.  Known DM Complications: retinopathy   Care Team: Primary Care Provider: Modesto Charon, NP Nephrology: [facility] in [place] Cardiology: Johnnette Gourd, MD   Medication Access/Adherence: Prescription drug coverage: Payor: [employer] MEDICARE / Plan: [facility] MEDICARE / Product Type: *No Product type* / .  - Reports that all medications are affordable.  - Current Patient Assistance: Novo (Ozempic), [place] Marcelline Deist) - Medication Adherence: Patient denies missing doses of their medication.    Since Last visit / History of Present Illness: ?   Diabetes Patient reports partially implementing plan from last visit. States she was taking metformin though notes stopping recently due to previous discussions w previous provider. Wanted to again confirm her regimen before resuming. She notes the pharmacy never called her about the new medicine to replace metformin (Jardiance w free-trial card).   Reported DM Regimen: ?  Metformin 500 mg daily (held today) Ozempic 1 mg weekly   SMBG: FBG: 140-150s.  Evening BG: 125 no average.    Hypo/Hyperglycemia: ?  Symptoms of hypoglycemia since last visit:? no  If yes, it was treated by: n/a  Symptoms of hyperglycemia since last visit:? no   Reported Diet: Patient typically eats 1 larger meal most days.  Breakfast: skips Lunch (12-2pm): vegetables, salads, a lot of chicken, sometimes fish Dinner: often skips/snacks Snacks: Apple/banana or  fruit. 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  History of chronic kidney disease? yes History of albuminuria? yes, last UACR 08/2021 = 30-300 mg/g (likely checking at [facility], not connected with Epic) ACE/ARB - Not taking Last eye exam: 04/29/23; Retinopathy Present Last foot exam: 02/22/2022 Tobacco Use: Former smoker  Immunizations:? Flu: Up to date (Last: 12/12/2022); Pneumococcal: Up to Date (Last: PPSV 2000, 2008; PCV 13 2015 after age 16); Shingrix: Up to date (Last: 03/23/2022, 10/31/2021); Tdap (last 2012 - DUE)  Cardiovascular Risk Reduction History of clinical ASCVD? no History of heart failure? yes History of hyperlipidemia? yes Current BMI: 27.3 kg/m2 (Ht 64 in, Wt 72.4 kg) Taking statin? yes; moderate intensity (rosuvastatin 10 mg) Taking aspirin? not indicated; Not taking Taking SGLT-2i?  No, in process of obtaining via PAP Taking GLP- 1 RA? no   Reported HTN/CHF Regimen: ?  None aside form PAF regimen Carvedilol (held at recent cardiology visit 4/7 for HR 65 bpm) Reported AF Regimen:  Diltiazem CD 360 mg q24h (am) Diltiazem 30 mg q6h PRN AF (has not needed to use recently) Eliquis 5 mg twice daily  (Age >80, though SCR <1.5, weight >60 kg) CHA2DS2-VASc = 5 (Age>74, CHF, HTN, DM2, Sex)  Patient takes their blood pressure medications in the morning Patient is checking their blood pressure at home regularly though cardiologist had some concerns of cuff accuracy given age.   Patient denies hypotensive s/sx. No dizziness, lightheadedness aside from baseline.  Patient  denies hypertensive symptoms. No headache, chest pain, shortness of breath, visual changes.      _______________________________________________  Objective     Review of Systems:? Limited in the setting of virtual visit  Constitutional:? No fever, chills or unintentional weight loss  Cardiovascular:? No chest pain or pressure, shortness of breath, dyspnea on exertion, orthopnea GI:? No nausea, vomiting, constipation, diarrhea, abdominal pain Endocrine:? No polyuria, polyphagia or blurred vision    Physical Examination:  Vitals:  Wt Readings from Last 3 Encounters:  06/03/23 159 lb 9.6 oz (72.4 kg)  05/29/23 157 lb (71.2 kg)  05/15/23 159 lb (72.1 kg)   BP Readings from Last 3 Encounters:  06/03/23 124/74  05/29/23 (!) 156/82  05/15/23 134/80   Pulse Readings from Last 3 Encounters:  06/03/23 65  05/29/23 80  05/15/23 89     Labs:?  Lab Results  Component Value Date   HGBA1C 7.8 (H) 05/15/2023   HGBA1C 7.0 02/11/2023   HGBA1C 6.8 10/23/2022   GLUCOSE 147 (H) 05/15/2023   MICRALBCREAT 30-300mg  08/28/2021   CREATININE 1.37 (H) 05/15/2023   CREATININE 0.94 01/12/2023   CREATININE 1.25 (H) 08/29/2022   GFR 35.82 (L) 05/15/2023   GFR 57.88 (L) 03/21/2021   GFR 75.20 03/20/2017    Lab Results  Component Value Date   CHOL 109 02/22/2022   LDLCALC 29 02/22/2022   LDLCALC 69 07/14/2020   LDLCALC 56 05/25/2019   LDLDIRECT 62 11/24/2018   LDLDIRECT 181 (H) 08/28/2018   HDL 61 02/22/2022   TRIG 107 02/22/2022   TRIG 77 07/14/2020   TRIG 128 05/25/2019   ALT 8 05/15/2023   ALT 17 01/12/2023   AST 13 05/15/2023   AST 19 01/12/2023      Chemistry      Component Value Date/Time   NA 136 05/15/2023 1017   NA 136 08/29/2022 1040   K 4.2 05/15/2023 1017   CL 100 05/15/2023 1017   CO2 29 05/15/2023 1017   BUN 32 (H) 05/15/2023 1017   BUN 32 (H) 08/29/2022 1040   CREATININE 1.37 (H) 05/15/2023 1017   CREATININE 1.07 (H) 12/22/2015 1051      Component Value Date/Time   CALCIUM 10.1 05/15/2023 1017   ALKPHOS 62 05/15/2023 1017   AST 13 05/15/2023 1017   ALT 8 05/15/2023 1017   BILITOT 0.4 05/15/2023 1017   BILITOT  <0.2 08/28/2022 1150     The ASCVD Risk score (Arnett DK, et al., 2019) failed to calculate for the following reasons:   The 2019 ASCVD risk score is only valid for ages 97 to 21  Assessment and Plan:   1. Diabetes, type 2: uncontrolled per last A1c of 7.8% (05/15/23), increased from previous 7.0% (02/11/23). Goal <7% without hypoglycemia, reasonable target <7.5% given significant comorbidity. Approved for Farxiga via PAP, has not yet received. Was not notified by pharmacy of Jardiance prescription/free trial which was intended to bridge her until Marcelline Deist is delivered. Called pharmacy and requested Jardiance fill via free trial card that was included on prescription originally..  confirmed copay is now $0.  Start Jardiance 10 mg daily - repeat BMP ~4 weeks Stop metformin  Continue Ozempic 1.0 mg sq weekly  Patient verbalizes understanding: Not to start [place] until she runs out of Jardiance.  Diet: Consider adding protein with snacks given primarily having sugar (via fruit). E.g. sugar-free yogurt, cheese, nuts, hummus, etc.   Future Consideration: GLP1-RA: Technically room for titration though goal to avoid further weight loss/appetite suppression. Historically  well-controlled on current regimen. Goal to regain glycemic control with SGLT2i/lifestyle modification.  Metformin: Avoid in setting of renal dysfunction. Not contraindicated, though optimization of renal-protective agents preferred.   SU: Not unreasonable, though defer given alternative options with lower risk of hypoglycemia.  TZD: Avoiding due to possible increase in fracture risk and CHF diagnosis Insulin: Avoid at this time given risk of hypoglycemia. Historically well-controlled on non-insulin therapies    2. HTN/AF/HFpEF: HTN/AF controlled based on last clinic BP of 124/74 mmHg (06/03/23), goal <130/80 mmHg. Carvedilol held at cardiology f/u 2/2 HR 65 bpm. Does monitor BP at home, though cardiology instructed her to consider a  new monitor d/t c/f inaccuracy. Still has not obtained cuff. Continue current regimen as prescribed Continue to hold carvedilol until PCP follow up next week The patient has a diagnosis of hypertension and it is medically necessary for them to have access to a home device to monitor blood pressure.  The patient does not have readily available insurance access to a device and cannot afford to purchase a device at this time.  The patient has been counseled that they do not need to continue to receive services from [facility] to receive a device.  The patient will be given a device free of charge. (Plans to pick up 06/13/23, front office)  Future Consideration (CHF preserved EF): RAASi: Long history of orthostasis/dizziness likely precludes RAASi. Documented Hx of possible angioedema. Not appropriate at this time.  BB: N/A LVEF >40%. AF well-controlled. No compelling indication for BB aside from AF/uncontrolled HTN. Patient will start to check BP at home with new cuff.  SGLT2i: Started today MRA: Previously prescribed spironolactone. Stopped 2018. Renal function/positional dizziness and potassium consistently at ULN preclude use at this time.    3. ASCVD (primary prevention): No recent LDL in chart. Last documented was well-controlled, 29 mg/dL (8119). LDL goal <70 mg/dL (primary prevention, diabetes).  Key risk factors include: diabetes, hypertension (well-controlled), hyperlipidemia, and former smoker Current Regimen: rosuvastatin 10 mg daily Continue medications today without changes. Consider repeat lipid panel with next routine labs   4. Healthcare Maintenance:  Pneumococcal - Current status: Complete  Shingles - Current status: Complete Influenza - Current status: Complete 2024  Due to receive the following vaccines: Tdap (last 2012)   Follow Up PCP follow up scheduled 1.5 weeks Follow up with clinical pharmacist via ~2-4 weeks after PCP visit - office visit per pt preference (will  schedule after PCP visit) Patient given direct line for questions regarding medication therapy  Future Appointments  Date Time Provider Department Center  06/18/2023 12:00 PM Jolanda Nation, NP [facility]  07/01/2023 10:30 AM [facility] US  2 [facility] None  07/02/2023 11:30 AM [facility] ANNUAL WELLNESS VISIT 2 [facility]  07/08/2023  7:40 AM [facility] DEVICE REMOTES [facility]  07/08/2023 10:00 AM Stoioff, Kizzie Perks, MD [facility] None  08/05/2023 11:40 AM Constancia Delton, MD [facility] None  08/12/2023  7:40 AM [facility] DEVICE REMOTES [facility]  09/16/2023  7:40 AM [facility] DEVICE REMOTES [facility]  10/21/2023  7:40 AM [facility] DEVICE REMOTES [facility]   Daron Ellen, PharmD Clinical Pharmacist [facility] [phone]

## 2023-06-17 ENCOUNTER — Ambulatory Visit: Payer: Self-pay

## 2023-06-17 NOTE — Telephone Encounter (Signed)
 Jolanda Nation, NP is out of the office sick today and tomorrow and scheduled with Dr. Joelle Musca on 06/19/23. Sending fyi for appt.

## 2023-06-17 NOTE — Telephone Encounter (Signed)
 Copied from CRM [id number]. Topic: Clinical - Red Word Triage >> Jun 17, 2023  3:40 PM Chuck Crater wrote: [facility] that prompted transfer to Nurse Triage: Patient states that she is in extreme pain (sometimes she can bare it) on the right side at the bottom of stomach like its going into her womb. She has heavy frequent urination, she states that it happens all day and night.   Chief Complaint: Abdominal pain  Symptoms: Right lower abdominal pain, increased urinary frequency  Frequency: Constant  Disposition: [] ED /[] Urgent Care (no appt availability in office) / [x] Appointment(In office/virtual)/ []  [facility]/ [] Home Care/ [] Refused Recommended Disposition /[] [facility]/ []  Follow-up with PCP Additional Notes: Patient reports she has had right lower abdominal pain, stating her pain goes below her waistline "like it's my womb" since September. She states that she has also been experiencing increased urinary frequency with her pain. She states that she has been evaluated for the same and that she has an upcoming appointment with her urologist but that she would like to be seen in the office before then if possible. Appointment made for the patient on Wednesday. Patient instructed to call back for new or worsening symptoms. Patient verbalized understanding and agreement with this plan.    Reason for Disposition  Abdominal pain is a chronic symptom (recurrent or ongoing AND present > 4 weeks)  Answer Assessment - Initial Assessment Questions 1. LOCATION: "Where does it hurt?"      Right lower abdomen pain  2. RADIATION: "Does the pain shoot anywhere else?" (e.g., chest, back)     Does not radiate  3. ONSET: "When did the pain begin?" (e.g., minutes, hours or days ago)      Since September  5. PATTERN "Does the pain come and go, or is it constant?"    - If it comes and goes: "How long does it last?" "Do you have pain now?"     (Note: Comes and goes means the pain is  intermittent. It goes away completely between bouts.)    - If constant: "Is it getting better, staying the same, or getting worse?"      (Note: Constant means the pain never goes away completely; most serious pain is constant and gets worse.)      Constant  6. SEVERITY: "How bad is the pain?"  (e.g., Scale 1-10; mild, moderate, or severe)    - MILD (1-3): Doesn't interfere with normal activities, abdomen soft and not tender to touch.     - MODERATE (4-7): Interferes with normal activities or awakens from sleep, abdomen tender to touch.     - SEVERE (8-10): Excruciating pain, doubled over, unable to do any normal activities.       4/10 currently  7. RECURRENT SYMPTOM: "Have you ever had this type of stomach pain before?" If Yes, ask: "When was the last time?" and "What happened that time?"      Yes, ongoing since September  8. CAUSE: "What do you think is causing the stomach pain?"     Unsure  10. OTHER SYMPTOMS: "Do you have any other symptoms?" (e.g., back pain, diarrhea, fever, urination pain, vomiting)       Frequent urination  Protocols used: Abdominal Pain - Female-A-AH

## 2023-06-18 ENCOUNTER — Ambulatory Visit: Admitting: General Practice

## 2023-06-18 ENCOUNTER — Other Ambulatory Visit: Payer: Self-pay | Admitting: Pharmacist

## 2023-06-18 DIAGNOSIS — E1122 Type 2 diabetes mellitus with diabetic chronic kidney disease: Secondary | ICD-10-CM

## 2023-06-18 MED ORDER — ONETOUCH ULTRA 2 W/DEVICE KIT: 1.0000 | PACK | Freq: Every day | 0 refills | Status: AC

## 2023-06-18 MED ORDER — ONETOUCH ULTRA VI STRP: ORAL_STRIP | 3 refills | Status: DC

## 2023-06-18 MED ORDER — ONETOUCH ULTRASOFT LANCETS MISC: 3 refills | Status: DC

## 2023-06-18 NOTE — Progress Notes (Unsigned)
 Brief Telephone Documentation Reason for Call: Patient called to report she has received her AZ&Me shipment  Summary of Call: Patient states she received the Farxiga  in the mail. She never did start taking Jardiance . Agreed to start Farxiga  and skip the Jardiance  altogether as to avoid confusion.   Advised her to start Farxiga  tomorrow given recommended administration in the morning. She plans to take with her early afternoon medications each day (only morning medication is levothyroxine ). For compliance okay to take in afternoon.  She also notes that her glucometer has stopped working altogether. No clear if batteries needed though she states she has not received a new glucometer in many years. Likely covered by insurance.   Pended to PCP for review: OneTouch glucometer  Follow Up: SGLT2i labs due 4 weeks (Farxiga  start 06/19/23) Can align with PharmD follow up as needed given patient requests in-person visits.  Patient given direct line for further questions/concerns  Daron Ellen, PharmD Clinical Pharmacist [facility] [phone]

## 2023-06-18 NOTE — Telephone Encounter (Signed)
 Noted---I will check her tomorrow at the visit

## 2023-06-19 ENCOUNTER — Encounter: Payer: Self-pay | Admitting: Internal Medicine

## 2023-06-19 ENCOUNTER — Ambulatory Visit (INDEPENDENT_AMBULATORY_CARE_PROVIDER_SITE_OTHER): Admitting: Internal Medicine

## 2023-06-19 VITALS — BP 154/72 | HR 2 | Temp 98.4°F | Ht 64.0 in | Wt 163.0 lb

## 2023-06-19 DIAGNOSIS — E1122 Type 2 diabetes mellitus with diabetic chronic kidney disease: Secondary | ICD-10-CM

## 2023-06-19 DIAGNOSIS — R350 Frequency of micturition: Secondary | ICD-10-CM

## 2023-06-19 DIAGNOSIS — N1831 Chronic kidney disease, stage 3a: Secondary | ICD-10-CM | POA: Diagnosis not present

## 2023-06-19 DIAGNOSIS — Z7984 Long term (current) use of oral hypoglycemic drugs: Secondary | ICD-10-CM | POA: Diagnosis not present

## 2023-06-19 DIAGNOSIS — I5032 Chronic diastolic (congestive) heart failure: Secondary | ICD-10-CM

## 2023-06-19 LAB — POC URINALSYSI DIPSTICK (AUTOMATED)
Bilirubin, UA: NEGATIVE
Blood, UA: NEGATIVE
Glucose, UA: NEGATIVE
Nitrite, UA: NEGATIVE
Protein, UA: POSITIVE — AB
Spec Grav, UA: >=1.03 — AB (ref 1.010–1.025)
Urobilinogen, UA: 0.2 U/dL
pH, UA: 5.5 (ref 5.0–8.0)

## 2023-06-19 MED ORDER — NITROFURANTOIN MONOHYD MACRO 100 MG PO CAPS
100.0000 mg | ORAL_CAPSULE | Freq: Two times a day (BID) | ORAL | 0 refills | Status: DC
Start: 2023-06-19 — End: 2023-06-28

## 2023-06-19 NOTE — Progress Notes (Signed)
 Subjective:    Patient ID: Courtney Pena, female    DOB: 12/31/1940, 83 y.o.   MRN: [id number]  HPI Here due to ongoing urinary issues  Has symptoms in her RLQ--constant pain that worsens at times This goes back about 6 months Did see her doctor at [facility] about this also  Urinary frequency--as much as 10 times at night Never less than 5-6 times No fever No blood in urine No burning dysuria  Has never taken jardiance  or farxiga  Stopped metformin  --was told to stop when starting SGLT-2 meds. Told her to restart  Does have appointment with urologist 5/12  No caffeine or coffee Only soda is diet ginger ale  Current Outpatient Medications on File Prior to Visit  Medication Sig Dispense Refill   allopurinol  (ZYLOPRIM ) 100 MG tablet Take 1 tablet (100 mg total) by mouth daily. 90 tablet 0   apixaban  (ELIQUIS ) 5 MG TABS tablet Take 5 mg by mouth 2 (two) times daily.     Apoaequorin (PREVAGEN PO) Take 1 tablet by mouth daily.     blood glucose meter kit and supplies Dispense based on patient and insurance preference. Use up to four times daily as directed. (FOR ICD-10 E10.9, E11.9). 1 each 0   Blood Glucose Monitoring Suppl (ONE TOUCH ULTRA 2) w/Device KIT 1 each by Does not apply route daily. Use glucometer to check blood sugar 3 times daily. E11.22 1 kit 0   Coenzyme Q10 (COQ10) 100 MG CAPS Take 100 mg by mouth daily.     diclofenac  Sodium (VOLTAREN ) 1 % GEL Apply 4 g topically 4 (four) times daily. 100 g 1   diltiazem  (CARDIZEM  CD) 360 MG 24 hr capsule Take 1 capsule (360 mg total) by mouth daily. 90 capsule 1   diltiazem  (CARDIZEM ) 30 MG tablet One tab by mouth every six hours as needed for atrial fib 30 tablet 6   glucose blood (ONETOUCH ULTRA) test strip 1 Container by Other route every morning.     glucose blood (ONETOUCH ULTRA) test strip Use to check blood sugar up to three times daily as instructed. E11.22 300 strip 3   Insulin  Pen Needle 31G X 8 MM  MISC Use with pen to inject once daily. 100 each 3   Lancets (ONETOUCH ULTRASOFT) lancets Use to check blood sugar up to 3 times daily. E11.22 300 each 3   levothyroxine  (SYNTHROID ) 112 MCG tablet Take 1 tablet (112 mcg total) by mouth daily before breakfast. 90 tablet 3   meclizine  (ANTIVERT ) 25 MG tablet TAKE 1 TABLET (25 MG TOTAL) BY MOUTH AS NEEDED. 30 tablet 3   metFORMIN  (GLUCOPHAGE ) 500 MG tablet Take by mouth 2 (two) times daily with a meal.     Multiple Vitamin (MULTI-VITAMINS) TABS Take 1 tablet by mouth daily.     nitroGLYCERIN  (NITROSTAT ) 0.4 MG SL tablet Place 1 tablet (0.4 mg total) under the tongue every 5 (five) minutes as needed for chest pain (Call 911 if chest pain after three doses). 25 tablet 3   omeprazole  (PRILOSEC) 40 MG capsule Take 1 capsule (40 mg total) by mouth daily. 90 capsule 1   rosuvastatin  (CRESTOR ) 10 MG tablet Take 1 tablet (10 mg total) by mouth daily. 90 tablet 1   VITAMIN D , CHOLECALCIFEROL , PO Take by mouth. 4000mg      dapagliflozin  propanediol (FARXIGA ) 10 MG TABS tablet Take 10 mg by mouth daily. 1 tablet by mouth each morning (AZ&Me PAP) (Patient not taking: Reported on 06/19/2023)  No current facility-administered medications on file prior to visit.    Allergies  Allergen Reactions   Actos [Pioglitazone] Swelling    Swelling all over body and moonface   Lisinopril Swelling    Swelling, may have had some breathing involvement. Angioedema   Nsaids Other (See Comments)    Patient reports internal bleeding. Experienced with tolmetin.    Penicillins Shortness Of Breath and Swelling    Arm Swelling with Penicillin (Occurred in 1960s) Breathing - throat swelling with Amoxicillin (Occurred prior to 2002)   Sulfamethoxazole Hives and Itching    "welps all over" immediately after dose   Flecainide  Other (See Comments)    Blurry  vision   Hctz [Hydrochlorothiazide ] Other (See Comments)    Gout   Iodinated Contrast Media Itching    Pt. Developed  mild itching after receiving IV cm; pt. Held; Dr. Kirke Pepper recomended she take 50 mg of benadryl  when she goes home-if necessary; Dr Marchia Seton recommends benadryl  prior to future exams requiring contrast media, but stated other doctors may recommend another premedication prep.   Prednisone  Other (See Comments)    Agitation, vivid dreams, anxiety does not want to take again.    Statins Other (See Comments)    Muscle aches with multiple statins. Able to tolerate rosuvastatin .   Gabapentin  Other (See Comments)    Caused dysphoria    Losartan  Potassium Swelling    Lower extremity swelling   Zetia  [Ezetimibe ] Other (See Comments)    Cramps    Past Medical History:  Diagnosis Date   Adenomatous polyp of colon 10/23/2021   Last colonoscopy was 02/2018 recommended 3 year follow up.       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  Did not tolerate zaralto for PAF.  Seems to tolerate ASA.[name]  Likely needs long term iron  to keep up with ongoing loss.     Arthritis    Atrial flutter (HCC) 12/07/2010   converted in ED with 300 mg flecainide    CAD (coronary artery disease)    a. mild per cath in 2004;  b. nonischemic Myoview  in March 2012;  c. Lex MV 1/14:  EF 66%, no ischemia   Cataract    Cold intolerance of hand 12/18/2021   Degenerative arthritis of thumb 02/12/2014   Disequilibrium 04/25/2006   Formatting of this note might be different from the original. Formatting of this note might be different from the original. Qualifier: Diagnosis of By: Bronson Canny MD, Sammie Crigler Last Assessment & Plan: Formatting of this note might be different from the original. Now describing ear fullness more on left side.  Perhaps the removal of cerumen impaction will help.  No further WU given previously seen by ENT   Diverticulosis of colon 04/25/2006   Qualifier:  Diagnosis of   By: Bronson Canny MD, Sammie Crigler      Replacing diagnoses that were inactivated after the 05/28/22 regulatory import     DJD, UNSPECIFIED 04/25/2006   Qualifier: Diagnosis of   By: Bronson Canny MD, Sammie Crigler      Replacing diagnoses that were inactivated after the 05/28/22 regulatory import     Dysrhythmia    A-Fib. 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     Hyperlipidemia    Hypertension    Hypothyroidism    Neuromuscular disorder (HCC)    neuropathy in feet   Obesity    Personal history of colonic polyps 06/06/2009   cecal polyp   Rheumatoid arthritis (HCC)    Shingles 2023   Sleep apnea    lost weight no longer needs   Type 2 diabetes mellitus with diabetic chronic kidney disease (HCC) 04/25/2006        Past Surgical History:  Procedure Laterality Date   ARTERY BIOPSY N/A 08/31/2021   Procedure: LEFT TEMPORAL ARTERY BIOPSY;  Surgeon: Oza Blumenthal, MD;  Location: [facility];  Service: General;  Laterality: N/A;   BREAST BIOPSY Left    BREAST EXCISIONAL BIOPSY Left    benign   CARDIAC CATHETERIZATION  1999&2004   CARPAL TUNNEL RELEASE Bilateral 2003   CATARACT EXTRACTION Bilateral 1990   CHOLECYSTECTOMY N/A 07/05/2021   Procedure: LAPAROSCOPIC CHOLECYSTECTOMY;  Surgeon: Junie Olds, MD;  Location: [facility];  Service: General;  Laterality: N/A;   COLONOSCOPY     ESOPHAGOGASTRODUODENOSCOPY  08/13/2011   Procedure: ESOPHAGOGASTRODUODENOSCOPY (EGD);  Surgeon: Kenney Peacemaker, MD;  Location: [facility] ENDOSCOPY;  Service: Endoscopy;  Laterality: N/A;   implantable loop recorder placement  07/20/2020   Medtronic Reveal Linq model LNQ 22 ([place]  [id number]) implantable loop recorder by Dr Nunzio Belch    Family History  Problem Relation Age of Onset   Heart disease Father    Diabetes Maternal Grandfather    Hypertension Mother    Other Mother        brain tumor-benign   Diabetes Daughter        pre-diabeties   Colon polyps Daughter    Thyroid  disease Daughter        x 2   Colon cancer Neg Hx    Esophageal cancer Neg Hx    Stomach cancer Neg Hx    Rectal cancer Neg Hx     Social History   Socioeconomic History   Marital status: Widowed    Spouse name: Caretha Chapel   Number of children: 4   Years of education: some colle   Highest education level: Not on file  Occupational History   Occupation: Management    Employer: UNEMPLOYED  Tobacco Use   Smoking status: Former    Current packs/day: 0.00    Average packs/day: 2.0 packs/day for 30.0 years (60.0 ttl pk-yrs)    Types: Cigarettes    Start date: 02/27/1963    Quit date: 02/26/1993    Years since quitting: 30.3    Passive exposure: Past   Smokeless tobacco: Never   Tobacco comments:    Former smoker 03/21/22  Vaping Use   Vaping status: Never Used  Substance and Sexual Activity   Alcohol use: No    Alcohol/week: 0.0 standard drinks of alcohol   Drug use: No   Sexual activity: Not Currently  Other Topics Concern   Not on file  Social History Narrative   Not on file   Social Drivers of Health   Financial Resource Strain: Not on file  Food Insecurity: No Food Insecurity (01/05/2022)   Hunger Vital Sign    Worried About Running Out of Food in the Last Year: Never true  Ran Out of Food in the Last Year: Never true  Transportation Needs: No Transportation Needs (01/05/2022)   PRAPARE - Administrator, Civil Service (Medical): No    Lack of Transportation (Non-Medical): No  Physical Activity: Not on file  Stress: Not on file  Social Connections: Not on file  Intimate Partner Violence: Not At Risk (01/05/2022)   Humiliation, Afraid, Rape, and Kick  questionnaire    Fear of Current or Ex-Partner: No    Emotionally Abused: No    Physically Abused: No    Sexually Abused: No   Review of Systems Bowels are slow---needs prune juice prn. Goes every day and empties pretty well    Objective:   Physical Exam Constitutional:      Appearance: Normal appearance.  Abdominal:     Palpations: Abdomen is soft.     Comments: Mild suprapubic tenderness and along right flank  Neurological:     Mental Status: She is alert.            Assessment & Plan:

## 2023-06-19 NOTE — Assessment & Plan Note (Addendum)
 Urine is concentrated---seems to have bladder irritability and may not be drinking enough Urinalysis does show 1+ leuk Due for urology evaluation--?interstitial cystitis No infections documented but will send culture and try empiric Rx with macrodantin  100 bid x 3 days

## 2023-06-19 NOTE — Patient Instructions (Addendum)
 Please hold off on both jardiance  and the farxiga . Stay off the carvedilol  but monitor your blood pressure at home and let Donnel Gail know if it stays over 150/90 regularly Don't take the metoprolol  at all Do take the metformin  for now

## 2023-06-19 NOTE — Assessment & Plan Note (Signed)
 Discussed holding off on the jardiance /farxiga  due to her urinary symptoms Go back on the metformin 

## 2023-06-19 NOTE — Addendum Note (Signed)
 Addended by: Franne Ivory on: 06/19/2023 11:25 AM   Modules accepted: Orders

## 2023-06-19 NOTE — Assessment & Plan Note (Signed)
 BP Readings from Last 3 Encounters:  06/19/23 (!) 154/72  06/03/23 124/74  05/29/23 (!) 156/82   BP up some Dr Junnie Olives suggests she restart the carvedilol  if it stays up. She will monitor at home

## 2023-06-26 DIAGNOSIS — H26492 Other secondary cataract, left eye: Secondary | ICD-10-CM | POA: Diagnosis not present

## 2023-06-28 ENCOUNTER — Ambulatory Visit (INDEPENDENT_AMBULATORY_CARE_PROVIDER_SITE_OTHER): Admitting: General Practice

## 2023-06-28 ENCOUNTER — Encounter: Payer: Self-pay | Admitting: General Practice

## 2023-06-28 VITALS — BP 134/64 | HR 85 | Temp 97.9°F | Ht 64.0 in | Wt 159.0 lb

## 2023-06-28 DIAGNOSIS — I10 Essential (primary) hypertension: Secondary | ICD-10-CM

## 2023-06-28 DIAGNOSIS — E7800 Pure hypercholesterolemia, unspecified: Secondary | ICD-10-CM

## 2023-06-28 DIAGNOSIS — N1831 Chronic kidney disease, stage 3a: Secondary | ICD-10-CM

## 2023-06-28 DIAGNOSIS — Z7985 Long-term (current) use of injectable non-insulin antidiabetic drugs: Secondary | ICD-10-CM | POA: Diagnosis not present

## 2023-06-28 DIAGNOSIS — E1122 Type 2 diabetes mellitus with diabetic chronic kidney disease: Secondary | ICD-10-CM | POA: Diagnosis not present

## 2023-06-28 DIAGNOSIS — Z7984 Long term (current) use of oral hypoglycemic drugs: Secondary | ICD-10-CM | POA: Diagnosis not present

## 2023-06-28 DIAGNOSIS — R350 Frequency of micturition: Secondary | ICD-10-CM

## 2023-06-28 DIAGNOSIS — I5032 Chronic diastolic (congestive) heart failure: Secondary | ICD-10-CM

## 2023-06-28 MED ORDER — SEMAGLUTIDE (1 MG/DOSE) 4 MG/3ML ~~LOC~~ SOPN: 1.0000 mg | PEN_INJECTOR | SUBCUTANEOUS | Status: DC

## 2023-06-28 NOTE — Patient Instructions (Addendum)
 Stop by the lab prior to leaving today. I will notify you of your results once received.   Continue to monitor to hold Carvedilol .   Continue Metformin  500 mg once daily.  Continue ozempic  1mg  weekly.   Heidi Llamas will be in touch with you regarding the ozempic .   Follow up with urology and cardiology later this month.   Schedule f/u with me in 3 months.   It was a pleasure to see you today!

## 2023-06-28 NOTE — Progress Notes (Signed)
 Established Patient Office Visit  Subjective   Patient ID: Courtney Pena, female    DOB: 21-Jan-1941  Age: 83 y.o. MRN: [id number]  Chief Complaint  Patient presents with   Hypertension    Patient following up on BP and other chronic conditions today. BP has mixed BP readings where its up and down. My reading today was 134/64 and her new machine read 145/76.     Hypertension Pertinent negatives include no chest pain, headaches or shortness of breath.    Courtney Pena is a 83 year old female with HTN, coronary atheroscleorsis, A fib, CHF, pulmonary nodule, hypothyroidism, type 2 DM, Hypercholesteremia, vitamin d  def. Presents today for a follow up on HTN.   HTN: currently managed on Diltiazem  360 mg once daily. She was asked to hold the carvedilol  by the cardiologist on 06/03/23. The home BP readings have been in the 140's-150's / 70's-80's range. There was a concern that her home cuff may not be working accurately. She just received a new cuff from the pharmacist. She reports that at home she does not relax prior to checking her BP. Usually is moving around. She had a high reading on 06/19/23 and told to continue to hold carvedilol . She denies any blurred vision, chest pain, shortness of breath or difficulty breathing.   DM: Current medications include: Metformin  500 mg once and Ozempic  1 mg once weekly. She has been getting ozempic  via patient assistance with her previous doctor. She would like to make sure that we can get her set up with that. She did not start Jardiance . She was started on Farxiga  via patient assistance. She did not start it due to her UTI. She was asked to not continue farxiga  and restart metformin . She continues to have urinary urgency and was given a prescription for nitrofurantoin  for three days.     Patient Active Problem List   Diagnosis Date Noted   Branch retinal vein occlusion of right eye with macular edema 05/15/2023   Itchy eyes  01/02/2023   History of gout 08/28/2022   Orthostatic hypotension 08/02/2022   Dizziness 07/02/2022   Subacute cough 07/02/2022   Right foot drop 05/07/2022   Tenosynovitis of left foot 10/19/2020   Hypercoagulable state due to paroxysmal atrial fibrillation (HCC) 10/03/2020   Rheumatoid arthritis (HCC) 03/21/2020   Tinnitus 03/21/2020   Idiopathic chronic gout of multiple sites without tophus 12/09/2019   Lumbar back pain with radiculopathy affecting left lower extremity 07/30/2019   Rotator cuff syndrome of right shoulder 11/24/2018   Hypertensive retinopathy of both eyes 10/14/2018   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 10/14/2018   Vitreomacular adhesion of left eye 10/14/2018   Diabetic retinopathy, nonproliferative, moderate (HCC) 03/07/2018   Chronic diastolic CHF (congestive heart failure) (HCC) 08/16/2017   Sudden hearing loss, left 09/25/2016   Chronic anticoag - Xarelto  05//2017, CHADS2CVASC=5 Eliquis  9/20 07/07/2015   Vitamin D  deficiency 09/18/2013   Hyperlipidemia associated with type 2 diabetes mellitus (HCC) 08/22/2012   Pseudophakia, both eyes 03/12/2012   Presence of intraocular lens 03/12/2012   Pulmonary nodule 12/26/2010   Paroxysmal atrial fibrillation (HCC) 12/14/2010   Urinary frequency 07/13/2010   Hypothyroidism 04/25/2006   Type 2 diabetes mellitus with diabetic chronic kidney disease (HCC) 04/25/2006   HYPERCHOLESTEROLEMIA 04/25/2006   HYPERTENSION, BENIGN SYSTEMIC 04/25/2006   Coronary atherosclerosis 04/25/2006   Reflux esophagitis 04/25/2006   VERTIGO NOS OR DIZZINESS 04/25/2006   Past Medical History:  Diagnosis Date   Adenomatous polyp  of colon 10/23/2021   Last colonoscopy was 02/2018 recommended 3 year follow up.       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  Did not  tolerate zaralto for PAF.  Seems to tolerate ASA.[name]  Likely needs long term iron  to keep up with ongoing loss.     Arthritis    Atrial flutter (HCC) 12/07/2010   converted in ED with 300 mg flecainide    CAD (coronary artery disease)    a. mild per cath in 2004;  b. nonischemic Myoview  in March 2012;  c. Lex MV 1/14:  EF 66%, no ischemia   Cataract    Cold intolerance of hand 12/18/2021   Degenerative arthritis of thumb 02/12/2014   Disequilibrium 04/25/2006   Formatting of this note might be different from the original. Formatting of this note might be different from the original. Qualifier: Diagnosis of By: Bronson Canny MD, Sammie Crigler Last Assessment & Plan: Formatting of this note might be different from the original. Now describing ear fullness more on left side.  Perhaps the removal of cerumen impaction will help.  No further WU given previously seen by ENT   Diverticulosis of colon 04/25/2006   Qualifier: Diagnosis of   By: Bronson Canny MD, Sammie Crigler      Replacing diagnoses that were inactivated after the 05/28/22 regulatory import     DJD, UNSPECIFIED 04/25/2006   Qualifier: Diagnosis of   By: Bronson Canny MD, Sammie Crigler      Replacing diagnoses that were inactivated after the 05/28/22 regulatory import     Dysrhythmia    A-Fib. 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     Hyperlipidemia    Hypertension    Hypothyroidism    Neuromuscular disorder (HCC)    neuropathy in feet   Obesity    Personal history of colonic polyps 06/06/2009   cecal polyp   Rheumatoid arthritis (HCC)    Shingles 2023   Sleep apnea    lost weight no longer needs   Type 2 diabetes mellitus  with diabetic chronic kidney disease (HCC) 04/25/2006       Past Surgical History:  Procedure Laterality Date   ARTERY BIOPSY N/A 08/31/2021   Procedure: LEFT TEMPORAL ARTERY BIOPSY;  Surgeon: Oza Blumenthal, MD;  Location: [facility];  Service: General;  Laterality: N/A;   BREAST BIOPSY Left    BREAST EXCISIONAL BIOPSY Left    benign   CARDIAC CATHETERIZATION  1999&2004   CARPAL TUNNEL RELEASE Bilateral 2003   CATARACT EXTRACTION Bilateral 1990   CHOLECYSTECTOMY N/A 07/05/2021   Procedure: LAPAROSCOPIC CHOLECYSTECTOMY;  Surgeon: Junie Olds, MD;  Location: [facility];  Service: General;  Laterality: N/A;   COLONOSCOPY     ESOPHAGOGASTRODUODENOSCOPY  08/13/2011   Procedure: ESOPHAGOGASTRODUODENOSCOPY (EGD);  Surgeon: Kenney Peacemaker, MD;  Location: [facility] ENDOSCOPY;  Service: Endoscopy;  Laterality: N/A;   implantable loop recorder placement  07/20/2020   Medtronic Reveal Linq model LNQ 22 ([place] [id number])  implantable loop recorder by Dr Nunzio Belch         06/28/2023    2:56 PM 06/19/2023   10:23 AM 05/15/2023   10:08 AM  Depression screen PHQ 2/9  Decreased Interest 0 0 0  Down, Depressed, Hopeless 0 0 0  PHQ - 2 Score 0 0 0  Altered sleeping 0  0  Tired, decreased energy 0  0  Change in appetite 0  0  Feeling bad or failure about yourself  0  0  Trouble concentrating 0  0  Moving slowly or fidgety/restless 0  0  Suicidal thoughts 0  0  PHQ-9 Score 0  0  Difficult doing work/chores Not difficult at all  Not difficult at all       06/28/2023    2:56 PM 05/15/2023   10:08 AM  GAD 7 : Generalized Anxiety Score  Nervous, Anxious, on Edge 0 0  Control/stop worrying 0 0  Worry too much - different things 0 0  Trouble relaxing 0 0  Restless 0 0  Easily annoyed or irritable 0 0  Afraid - awful might happen 0 0  Total GAD 7 Score 0 0  Anxiety Difficulty Not difficult at all Not difficult at all      Review of Systems  Constitutional:  Negative for chills and fever.  Respiratory:   Negative for shortness of breath.   Cardiovascular:  Negative for chest pain.  Gastrointestinal:  Negative for abdominal pain, constipation, diarrhea, heartburn, nausea and vomiting.  Genitourinary:  Negative for dysuria, frequency and urgency.  Neurological:  Negative for dizziness and headaches.  Endo/Heme/Allergies:  Negative for polydipsia.  Psychiatric/Behavioral:  Negative for depression and suicidal ideas. The patient is not nervous/anxious.       Objective:     BP 134/64 (BP Location: Left Arm, Patient Position: Sitting, Cuff Size: Normal)   Pulse 85   Temp 97.9 F (36.6 C) (Other (Comment))   Ht 5\' 4"  (1.626 m)   Wt 159 lb (72.1 kg)   SpO2 97%   BMI 27.29 kg/m  BP Readings from Last 3 Encounters:  06/28/23 134/64  06/19/23 (!) 154/72  06/03/23 124/74   Wt Readings from Last 3 Encounters:  06/28/23 159 lb (72.1 kg)  06/19/23 163 lb (73.9 kg)  06/03/23 159 lb 9.6 oz (72.4 kg)      Physical Exam Vitals and nursing note reviewed.  Constitutional:      Appearance: Normal appearance.  Cardiovascular:     Rate and Rhythm: Normal rate and regular rhythm.     Pulses: Normal pulses.     Heart sounds: Normal heart sounds.  Pulmonary:     Effort: Pulmonary effort is normal.     Breath sounds: Normal breath sounds.  Neurological:     Mental Status: She is alert and oriented to person, place, and time.  Psychiatric:        Mood and Affect: Mood normal.        Behavior: Behavior normal.        Thought Content: Thought content normal.        Judgment: Judgment normal.      No results found for any visits on 06/28/23.     The ASCVD Risk score (Arnett DK, et al., 2019) failed to calculate for the following reasons:   The 2019 ASCVD risk score is only valid for ages 7 to 50    Assessment & Plan:  Type 2 diabetes mellitus with stage 3a chronic kidney  disease, without long-term current use of insulin  [facility]) Assessment & Plan: Uncontrolled.   Notified  pharmacist regarding patient needing assistance with ozempic .  Unable to use SGLT2 given her urinary urgency and frequency. She has an appt to see urology on 07/08/23.  Continue Metformin  500 mg once daily.  Urine ACR pending.  Foot exam completed today.  F/u in three months.  Orders: -     Semaglutide  (1 MG/DOSE); Inject 1 mg as directed once a week. -     Microalbumin / creatinine urine ratio  HYPERTENSION, BENIGN SYSTEMIC Assessment & Plan: Improving.   Continue cardizem  360 mg once daily.  Hold carvedilol .  Continue checking readings as home.  Cardizem  30 mg PRN.   F/u with cardiology as scheduled in June.  F/u with me in three months.   Urinary frequency Assessment & Plan: Ongoing. Scheduled to see urology on 07/01/23.   HYPERCHOLESTEROLEMIA Assessment & Plan: Lipid panel pending.  Continue rosuvastatin  10 mg once daily.  Orders: -     Lipid panel; Future    Return in about 3 months (around 09/28/2023) for chronic care management.Jolanda Nation, NP

## 2023-06-28 NOTE — Assessment & Plan Note (Signed)
 Ongoing. Scheduled to see urology on 07/01/23.

## 2023-06-28 NOTE — Assessment & Plan Note (Signed)
 Uncontrolled.   Notified pharmacist regarding patient needing assistance with ozempic .  Unable to use SGLT2 given her urinary urgency and frequency. She has an appt to see urology on 07/08/23.  Continue Metformin  500 mg once daily.  Urine ACR pending.  Foot exam completed today.  F/u in three months.

## 2023-06-28 NOTE — Assessment & Plan Note (Signed)
 Improving.   Continue cardizem  360 mg once daily.  Hold carvedilol .  Continue checking readings as home.  Cardizem  30 mg PRN.   F/u with cardiology as scheduled in June.  F/u with me in three months.

## 2023-06-28 NOTE — Assessment & Plan Note (Signed)
 Lipid panel pending.  Continue rosuvastatin  10 mg once daily.

## 2023-06-29 LAB — MICROALBUMIN / CREATININE URINE RATIO
Creatinine, Urine: 194 mg/dL (ref 20–275)
Microalb Creat Ratio: 618 mg/g{creat} — ABNORMAL HIGH (ref <30)
Microalb, Ur: 119.9 mg/dL

## 2023-07-01 ENCOUNTER — Encounter: Payer: Self-pay | Admitting: General Practice

## 2023-07-01 ENCOUNTER — Ambulatory Visit: Attending: Cardiology

## 2023-07-01 ENCOUNTER — Other Ambulatory Visit (INDEPENDENT_AMBULATORY_CARE_PROVIDER_SITE_OTHER): Admitting: Pharmacist

## 2023-07-01 DIAGNOSIS — I480 Paroxysmal atrial fibrillation: Secondary | ICD-10-CM

## 2023-07-01 DIAGNOSIS — E1122 Type 2 diabetes mellitus with diabetic chronic kidney disease: Secondary | ICD-10-CM

## 2023-07-01 LAB — ECHOCARDIOGRAM COMPLETE
AR max vel: 2.06 cm2
AV Area VTI: 2.05 cm2
AV Area mean vel: 1.97 cm2
AV Mean grad: 5 mmHg
AV Peak grad: 9.4 mmHg
Ao pk vel: 1.53 m/s
Area-P 1/2: 4.06 cm2
S' Lateral: 3.17 cm

## 2023-07-01 NOTE — Progress Notes (Signed)
 Brief Telephone Documentation Reason for Call: Patient left message regarding Ozempic  refill via [employer] PAP  Summary of Call: Per [employer] automated system:  Last shipment: [phone] - delivered February Next shipment Due: 06/28/23 Current shipment status: N/A - not in process  Called and spoke w [employer] rep. Reports they did receive the refill form/provider change request form on 05/31/23 though medication was not refilled.   Refill request processed today. Should be delivered within 10-14 business days.   Novo ID: [id number]  Discussed with patient via phone.  She reports she has ~5 or 6 weeks of Ozempic  remaining at this time.   Daron Ellen, PharmD Clinical Pharmacist [facility] [phone]

## 2023-07-02 ENCOUNTER — Ambulatory Visit (INDEPENDENT_AMBULATORY_CARE_PROVIDER_SITE_OTHER)

## 2023-07-02 VITALS — BP 134/64 | Ht 64.0 in | Wt 155.0 lb

## 2023-07-02 DIAGNOSIS — Z0000 Encounter for general adult medical examination without abnormal findings: Secondary | ICD-10-CM | POA: Diagnosis not present

## 2023-07-02 NOTE — Patient Instructions (Signed)
 Ms. Tarpey , Thank you for taking time to come for your Medicare Wellness Visit. I appreciate your ongoing commitment to your health goals. Please review the following plan we discussed and let me know if I can assist you in the future.   Referrals/Orders/Follow-Ups/Clinician Recommendations: follow up as scheduled for next AWV  This is a list of the screening recommended for you and due dates:   Health Maintenance  Topic Date Due   DTaP/Tdap/Td vaccine (3 - Td or Tdap) 05/14/2024*   Flu Shot  09/27/2023   Hemoglobin A1C  11/15/2023   Eye exam for diabetics  04/28/2024   Yearly kidney function blood test for diabetes  05/14/2024   Yearly kidney health urinalysis for diabetes  06/27/2024   Complete foot exam   06/27/2024   Medicare Annual Wellness Visit  07/01/2024   Pneumonia Vaccine  Completed   DEXA scan (bone density measurement)  Completed   Zoster (Shingles) Vaccine  Completed   HPV Vaccine  Aged Out   Meningitis B Vaccine  Aged Out   Colon Cancer Screening  Discontinued   COVID-19 Vaccine  Discontinued   Hepatitis C Screening  Discontinued  *Topic was postponed. The date shown is not the original due date.    Advanced directives: (Declined) Advance directive discussed with you today. Even though you declined this today, please call our office should you change your mind, and we can give you the proper paperwork for you to fill out.  Next Medicare Annual Wellness Visit scheduled for next year: Yes  Have you seen your provider in the last 6 months (3 months if uncontrolled diabetes)? Yes

## 2023-07-02 NOTE — Progress Notes (Signed)
 Because this visit was a virtual/telehealth visit,  certain criteria was not obtained, such a blood pressure, CBG if applicable, and timed get up and go. Any medications not marked as "taking" were not mentioned during the medication reconciliation part of the visit. Any vitals not documented were not able to be obtained due to this being a telehealth visit or patient was unable to self-report a recent blood pressure reading due to a lack of equipment at home via telehealth. Vitals that have been documented are verbally provided by the patient.  Subjective:   Courtney Pena is a 83 y.o. who presents for a Medicare Wellness preventive visit.  Visit Complete: Virtual I connected with  Courtney Pena on 07/02/23 by a audio enabled telemedicine application and verified that I am speaking with the correct person using two identifiers.  Patient Location: Home  Provider Location: Home Office  I discussed the limitations of evaluation and management by telemedicine. The patient expressed understanding and agreed to proceed.  Vital Signs: Because this visit was a virtual/telehealth visit, some criteria may be missing or patient reported. Any vitals not documented were not able to be obtained and vitals that have been documented are patient reported.  VideoDeclined- This patient declined Librarian, academic. Therefore the visit was completed with audio only.  Persons Participating in Visit: Patient.  AWV Questionnaire: No: Patient Medicare AWV questionnaire was not completed prior to this visit.  Cardiac Risk Factors include: advanced age (>86men, >34 women);diabetes mellitus;dyslipidemia;hypertension     Objective:    Today's Vitals   07/02/23 1150  BP: 134/64  Weight: 155 lb (70.3 kg)  Height: 5\' 4"  (1.626 m)   Body mass index is 26.61 kg/m.     07/02/2023   11:49 AM 03/08/2023   10:52 AM 01/12/2023    1:56 PM 10/24/2022    7:12 PM  09/05/2022    1:09 PM 08/28/2022   10:02 AM 08/13/2022    2:15 PM  Advanced Directives  Does Patient Have a Medical Advance Directive? Yes No Yes No Yes No No  Type of Estate agent of [place];Living will  Healthcare Power of [place];Living will  Living will;Healthcare Power of Attorney    Does patient want to make changes to medical advance directive? No - Patient declined    No - Patient declined    Copy of Healthcare Power of Attorney in Chart? No - copy requested    No - copy requested    Would patient like information on creating a medical advance directive?  No - Patient declined   No - Patient declined No - Patient declined No - Patient declined    Current Medications (verified) Outpatient Encounter Medications as of 07/02/2023  Medication Sig   allopurinol  (ZYLOPRIM ) 100 MG tablet Take 1 tablet (100 mg total) by mouth daily.   apixaban  (ELIQUIS ) 5 MG TABS tablet Take 5 mg by mouth 2 (two) times daily.   Apoaequorin (PREVAGEN PO) Take 1 tablet by mouth daily.   blood glucose meter kit and supplies Dispense based on patient and insurance preference. Use up to four times daily as directed. (FOR ICD-10 E10.9, E11.9).   Blood Glucose Monitoring Suppl (ONE TOUCH ULTRA 2) w/Device KIT 1 each by Does not apply route daily. Use glucometer to check blood sugar 3 times daily. E11.22   Coenzyme Q10 (COQ10) 100 MG CAPS Take 100 mg by mouth daily.   diclofenac  Sodium (VOLTAREN ) 1 % GEL Apply 4 g topically 4 (  four) times daily.   diltiazem  (CARDIZEM  CD) 360 MG 24 hr capsule Take 1 capsule (360 mg total) by mouth daily.   diltiazem  (CARDIZEM ) 30 MG tablet One tab by mouth every six hours as needed for atrial fib   glucose blood (ONETOUCH ULTRA) test strip 1 Container by Other route every morning.   glucose blood (ONETOUCH ULTRA) test strip Use to check blood sugar up to three times daily as instructed. E11.22   Insulin  Pen Needle 31G X 8 MM MISC Use with pen to inject once daily.    Lancets (ONETOUCH ULTRASOFT) lancets Use to check blood sugar up to 3 times daily. E11.22   levothyroxine  (SYNTHROID ) 112 MCG tablet Take 1 tablet (112 mcg total) by mouth daily before breakfast.   meclizine  (ANTIVERT ) 25 MG tablet TAKE 1 TABLET (25 MG TOTAL) BY MOUTH AS NEEDED.   metFORMIN  (GLUCOPHAGE ) 500 MG tablet Take by mouth daily with breakfast.   Multiple Vitamin (MULTI-VITAMINS) TABS Take 1 tablet by mouth daily.   nitroGLYCERIN  (NITROSTAT ) 0.4 MG SL tablet Place 1 tablet (0.4 mg total) under the tongue every 5 (five) minutes as needed for chest pain (Call 911 if chest pain after three doses).   omeprazole  (PRILOSEC) 40 MG capsule Take 1 capsule (40 mg total) by mouth daily.   rosuvastatin  (CRESTOR ) 10 MG tablet Take 1 tablet (10 mg total) by mouth daily.   Semaglutide , 1 MG/DOSE, 4 MG/3ML SOPN Inject 1 mg as directed once a week.   VITAMIN D , CHOLECALCIFEROL , PO Take by mouth. 4000mg    dapagliflozin  propanediol (FARXIGA ) 10 MG TABS tablet Take 10 mg by mouth daily. 1 tablet by mouth each morning (AZ&Me PAP) (Patient not taking: Reported on 06/19/2023)   No facility-administered encounter medications on file as of 07/02/2023.    Allergies (verified) Actos [pioglitazone], Lisinopril, Nsaids, Penicillins, Sulfamethoxazole, Flecainide , Hctz [hydrochlorothiazide ], Iodinated contrast media, Prednisone , Statins, Gabapentin , Losartan  potassium, and Zetia  [ezetimibe ]   History: Past Medical History:  Diagnosis Date   Adenomatous polyp of colon 10/23/2021   Last colonoscopy was 02/2018 recommended 3 year follow up.       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  Did not tolerate zaralto for PAF.  Seems to tolerate ASA.[name]  Likely needs long term iron  to keep up with ongoing loss.     Arthritis    Atrial flutter (HCC) 12/07/2010    converted in ED with 300 mg flecainide    CAD (coronary artery disease)    a. mild per cath in 2004;  b. nonischemic Myoview  in March 2012;  c. Lex MV 1/14:  EF 66%, no ischemia   Cataract    Cold intolerance of hand 12/18/2021   Degenerative arthritis of thumb 02/12/2014   Disequilibrium 04/25/2006   Formatting of this note might be different from the original. Formatting of this note might be different from the original. Qualifier: Diagnosis of By: Bronson Canny MD, Sammie Crigler Last Assessment & Plan: Formatting of this note might be different from the original. Now describing ear fullness more on left side.  Perhaps the removal of cerumen impaction will help.  No further WU given previously seen by ENT   Diverticulosis of colon 04/25/2006   Qualifier: Diagnosis of   By: Bronson Canny MD, Sammie Crigler      Replacing diagnoses that were inactivated after the 05/28/22 regulatory import  DJD, UNSPECIFIED 04/25/2006   Qualifier: Diagnosis of   By: Bronson Canny MD, Sammie Crigler      Replacing diagnoses that were inactivated after the 05/28/22 regulatory import     Dysrhythmia    A-Fib. 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     Hyperlipidemia    Hypertension    Hypothyroidism    Neuromuscular disorder (HCC)    neuropathy in feet   Obesity    Personal history of colonic polyps 06/06/2009   cecal polyp   Rheumatoid arthritis (HCC)    Shingles 2023   Sleep apnea    lost weight no longer needs   Type 2 diabetes mellitus with diabetic chronic kidney disease (HCC) 04/25/2006       Past Surgical History:  Procedure Laterality Date   ARTERY BIOPSY N/A 08/31/2021   Procedure: LEFT  TEMPORAL ARTERY BIOPSY;  Surgeon: Oza Blumenthal, MD;  Location: [facility];  Service: General;  Laterality: N/A;   BREAST BIOPSY Left    BREAST EXCISIONAL BIOPSY Left    benign   CARDIAC CATHETERIZATION  1999&2004   CARPAL TUNNEL RELEASE Bilateral 2003   CATARACT EXTRACTION Bilateral 1990   CHOLECYSTECTOMY N/A 07/05/2021   Procedure: LAPAROSCOPIC CHOLECYSTECTOMY;  Surgeon: Junie Olds, MD;  Location: [facility];  Service: General;  Laterality: N/A;   COLONOSCOPY     ESOPHAGOGASTRODUODENOSCOPY  08/13/2011   Procedure: ESOPHAGOGASTRODUODENOSCOPY (EGD);  Surgeon: Kenney Peacemaker, MD;  Location: [facility];  Service: Endoscopy;  Laterality: N/A;   implantable loop recorder placement  07/20/2020   Medtronic Reveal Linq model LNQ 22 ([place] [id number]) implantable loop recorder by Dr Nunzio Belch   Family History  Problem Relation Age of Onset   Heart disease Father    Diabetes Maternal Grandfather    Hypertension Mother    Other Mother        brain tumor-benign   Diabetes Daughter        pre-diabeties   Colon polyps Daughter    Thyroid  disease Daughter        x 2   Colon cancer Neg Hx    Esophageal cancer Neg Hx    Stomach cancer Neg Hx    Rectal cancer Neg Hx    Social History   Socioeconomic History   Marital status: Widowed    Spouse name: Caretha Chapel   Number of children: 4   Years of education: some colle   Highest education level: Not on file  Occupational History   Occupation: Management    Employer: UNEMPLOYED  Tobacco Use   Smoking status: Former    Current packs/day: 0.00    Average packs/day: 2.0 packs/day for 30.0 years (60.0 ttl pk-yrs)    Types: Cigarettes    Start date: 02/27/1963    Quit date: 02/26/1993    Years since quitting: 30.3    Passive exposure: Past   Smokeless tobacco: Never   Tobacco comments:    Former smoker 03/21/22  Vaping Use   Vaping status: Never Used  Substance and Sexual Activity   Alcohol use: No    Alcohol/week: 0.0 standard drinks of  alcohol  Drug use: No   Sexual activity: Not Currently  Other Topics Concern   Not on file  Social History Narrative   Not on file   Social Drivers of Health   Financial Resource Strain: Low Risk  (07/02/2023)   Overall Financial Resource Strain (CARDIA)    Difficulty of Paying Living Expenses: Not hard at all  Food Insecurity: No Food Insecurity (07/02/2023)   Hunger Vital Sign    Worried About Running Out of Food in the Last Year: Never true    Ran Out of Food in the Last Year: Never true  Transportation Needs: No Transportation Needs (07/02/2023)   PRAPARE - Administrator, Civil Service (Medical): No    Lack of Transportation (Non-Medical): No  Physical Activity: Sufficiently Active (07/02/2023)   Exercise Vital Sign    Days of Exercise per Week: 7 days    Minutes of Exercise per Session: 40 min  Stress: No Stress Concern Present (07/02/2023)   [employer] of Occupational Health - Occupational Stress Questionnaire    Feeling of Stress : Not at all  Social Connections: Socially Integrated (07/02/2023)   Social Connection and Isolation Panel [NHANES]    Frequency of Communication with Friends and Family: More than three times a week    Frequency of Social Gatherings with Friends and Family: More than three times a week    Attends Religious Services: More than 4 times per year    Active Member of [employer] or Organizations: Yes    Attends Banker Meetings: More than 4 times per year    Marital Status: Married    Tobacco Counseling Counseling given: Not Answered Tobacco comments: Former smoker 03/21/22    Clinical Intake:  Pre-visit preparation completed: Yes  Pain : No/denies pain     BMI - recorded: 26.61 Nutritional Status: BMI 25 -29 Overweight Nutritional Risks: Other (Comment) Diabetes: Yes CBG done?: No Did pt. bring in CBG monitor from home?: No  Lab Results  Component Value Date   HGBA1C 7.8 (H) 05/15/2023   HGBA1C 7.0 02/11/2023    HGBA1C 6.8 10/23/2022     How often do you need to have someone help you when you read instructions, pamphlets, or other written materials from your doctor or pharmacy?: 1 - Never What is the last grade level you completed in school?: [employer]  Interpreter Needed?: No  Information entered by :: Juliann Ochoa   Activities of Daily Living     07/02/2023   11:54 AM  In your present state of health, do you have any difficulty performing the following activities:  Hearing? 0  Vision? 0  Difficulty concentrating or making decisions? 1  Comment forgets names  Walking or climbing stairs? 0  Dressing or bathing? 0  Doing errands, shopping? 0  Preparing Food and eating ? N  Using the Toilet? N  In the past six months, have you accidently leaked urine? N  Do you have problems with loss of bowel control? N  Managing your Medications? N  Managing your Finances? N  Housekeeping or managing your Housekeeping? N    Patient Care Team: Jolanda Nation, NP as PCP - General (General Practice) Lei Pump, MD as PCP - Electrophysiology (Cardiology) Loyde Rule, MD as Consulting Physician (Cardiology) Ladon Pickler, MD as Referring Physician (Ophthalmology) Alix Aquas, MD as Consulting Physician (Orthopedic Surgery) Mabelene Savannah, MD as Consulting Physician (Podiatry)  Indicate any recent Medical Services you may have received from other than [facility]  providers in the past year (date may be approximate).     Assessment:   This is a routine wellness examination for Courtney Pena.  Hearing/Vision screen Hearing Screening - Comments:: No hearing issues Vision Screening - Comments:: Wears glasses   Goals Addressed             This Visit's Progress    Patient Stated       Patient would like to keep moving and active       Depression Screen     07/02/2023   11:56 AM 06/28/2023    2:56 PM 06/19/2023   10:23 AM 05/15/2023   10:08 AM 03/08/2023   10:52 AM 02/15/2023    11:07 AM 02/11/2023   10:07 AM  PHQ 2/9 Scores  PHQ - 2 Score 0 0 0 0 0 0 0  PHQ- 9 Score 0 0  0 0 0 0    Fall Risk     07/02/2023   11:53 AM 06/28/2023    2:56 PM 06/19/2023   10:22 AM 05/15/2023   10:08 AM 03/08/2023   10:57 AM  Fall Risk   Falls in the past year? 0 0 1 1 0  Number falls in past yr: 0 0 0 0 0  Injury with Fall? 0 0 0 0 0  Risk for fall due to : No Fall Risks No Fall Risks No Fall Risks No Fall Risks No Fall Risks  Follow up Falls prevention discussed;Falls evaluation completed Falls evaluation completed Falls evaluation completed Falls evaluation completed Falls prevention discussed    MEDICARE RISK AT HOME:  Medicare Risk at Home Any stairs in or around the home?: Yes If so, are there any without handrails?: No Home free of loose throw rugs in walkways, pet beds, electrical cords, etc?: Yes Adequate lighting in your home to reduce risk of falls?: Yes Life alert?: No Use of a cane, walker or w/c?: No Grab bars in the bathroom?: Yes Shower chair or bench in shower?: Yes Elevated toilet seat or a handicapped toilet?: Yes  TIMED UP AND GO:  Was the test performed?  No  Cognitive Function: 6CIT completed      05/07/2022    9:00 AM  Montreal Cognitive Assessment   Visuospatial/ Executive (0/5) 3  Naming (0/3) 3  Attention: Read list of digits (0/2) 2  Attention: Read list of letters (0/1) 1  Attention: Serial 7 subtraction starting at 100 (0/3) 3  Language: Repeat phrase (0/2) 2  Language : Fluency (0/1) 1  Abstraction (0/2) 2  Delayed Recall (0/5) 2  Orientation (0/6) 6  Total 25      07/02/2023   11:52 AM  6CIT Screen  What Year? 0 points  What month? 0 points  What time? 0 points  Count back from 20 0 points  Months in reverse 0 points  Repeat phrase 0 points  Total Score 0 points    Immunizations Immunization History  Administered Date(s) Administered   Fluad Quad(high Dose 65+) 11/26/2019, 12/05/2020, 12/18/2021   Fluad  Trivalent(High Dose 65+) 12/12/2022   Influenza Split 11/22/2010, 11/08/2011   Influenza Whole 12/09/2006, 12/19/2007, 01/12/2009   Influenza, High Dose Seasonal PF 12/15/2016   Influenza,inj,Quad PF,6+ Mos 11/12/2012, 12/18/2013, 01/27/2015, 11/09/2015, 11/28/2017, 11/24/2018   Influenza-Unspecified 12/25/2016   PFIZER Comirnaty(Gray Top)Covid-19 Tri-Sucrose Vaccine 03/21/2020   PFIZER(Purple Top)SARS-COV-2 Vaccination 04/05/2019, 04/30/2019   Pfizer(Comirnaty)Fall Seasonal Vaccine 12 years and older 02/22/2022   Pneumococcal Conjugate-13 05/27/2013   Pneumococcal Polysaccharide-23 08/27/1998, 12/09/2006   Td  11/26/2000   Td (Adult),unspecified 10/02/1979   Tdap 11/22/2010   Zoster Recombinant(Shingrix) 10/31/2021, 03/23/2022   Zoster, Live 10/01/2007    Screening Tests Health Maintenance  Topic Date Due   DTaP/Tdap/Td (3 - Td or Tdap) 05/14/2024 (Originally 11/21/2020)   INFLUENZA VACCINE  09/27/2023   HEMOGLOBIN A1C  11/15/2023   OPHTHALMOLOGY EXAM  04/28/2024   Diabetic kidney evaluation - eGFR measurement  05/14/2024   Diabetic kidney evaluation - Urine ACR  06/27/2024   FOOT EXAM  06/27/2024   Medicare Annual Wellness (AWV)  07/01/2024   Pneumonia Vaccine 64+ Years old  Completed   DEXA SCAN  Completed   Zoster Vaccines- Shingrix  Completed   HPV VACCINES  Aged Out   Meningococcal B Vaccine  Aged Out   Colonoscopy  Discontinued   COVID-19 Vaccine  Discontinued   Hepatitis C Screening  Discontinued    Health Maintenance  There are no preventive care reminders to display for this patient. Health Maintenance Items Addressed:   Additional Screening:  Vision Screening: Recommended annual ophthalmology exams for early detection of glaucoma and other disorders of the eye.  Dental Screening: Recommended annual dental exams for proper oral hygiene  Community Resource Referral / Chronic Care Management: CRR required this visit?  No   CCM required this visit?  No      Plan:     I have personally reviewed and noted the following in the patient's chart:   Medical and social history Use of alcohol, tobacco or illicit drugs  Current medications and supplements including opioid prescriptions. Patient is not currently taking opioid prescriptions. Functional ability and status Nutritional status Physical activity Advanced directives List of other physicians Hospitalizations, surgeries, and ER visits in previous 12 months Vitals Screenings to include cognitive, depression, and falls Referrals and appointments  In addition, I have reviewed and discussed with patient certain preventive protocols, quality metrics, and best practice recommendations. A written personalized care plan for preventive services as well as general preventive health recommendations were provided to patient.     Courtney Pena, [place]   07/02/2023   After Visit Summary: (MyChart) Due to this being a telephonic visit, the after visit summary with patients personalized plan was offered to patient via MyChart   Notes: Nothing significant to report at this time.

## 2023-07-08 ENCOUNTER — Ambulatory Visit (INDEPENDENT_AMBULATORY_CARE_PROVIDER_SITE_OTHER): Payer: Medicare Other

## 2023-07-08 ENCOUNTER — Encounter: Payer: Self-pay | Admitting: Urology

## 2023-07-08 ENCOUNTER — Ambulatory Visit: Admitting: Urology

## 2023-07-08 VITALS — BP 182/95 | HR 76 | Ht 64.0 in | Wt 155.0 lb

## 2023-07-08 DIAGNOSIS — R3915 Urgency of urination: Secondary | ICD-10-CM

## 2023-07-08 DIAGNOSIS — R1031 Right lower quadrant pain: Secondary | ICD-10-CM | POA: Diagnosis not present

## 2023-07-08 DIAGNOSIS — I480 Paroxysmal atrial fibrillation: Secondary | ICD-10-CM | POA: Diagnosis not present

## 2023-07-08 DIAGNOSIS — N3941 Urge incontinence: Secondary | ICD-10-CM | POA: Diagnosis not present

## 2023-07-08 DIAGNOSIS — R350 Frequency of micturition: Secondary | ICD-10-CM

## 2023-07-08 DIAGNOSIS — I10 Essential (primary) hypertension: Secondary | ICD-10-CM | POA: Diagnosis not present

## 2023-07-08 LAB — URINALYSIS, COMPLETE
Bilirubin, UA: NEGATIVE
Glucose, UA: NEGATIVE
Ketones, UA: NEGATIVE
Nitrite, UA: NEGATIVE
RBC, UA: NEGATIVE
Specific Gravity, UA: 1.02 (ref 1.005–1.030)
Urobilinogen, Ur: 0.2 mg/dL (ref 0.2–1.0)
pH, UA: 6.5 (ref 5.0–7.5)

## 2023-07-08 LAB — MICROSCOPIC EXAMINATION: Epithelial Cells (non renal): >10 /HPF — AB (ref 0–10)

## 2023-07-08 MED ORDER — TROSPIUM CHLORIDE 20 MG PO TABS: 20.0000 mg | ORAL_TABLET | Freq: Two times a day (BID) | ORAL | 1 refills | Status: DC

## 2023-07-08 NOTE — Progress Notes (Signed)
 I, Maysun Jamey Mccallum, acting as a Neurosurgeon for Geraline Knapp, MD., have documented all relevant documentation on the behalf of Geraline Knapp, MD, as directed by Geraline Knapp, MD while in the presence of Geraline Knapp, MD.  Discussed the use of AI scribe software for clinical note transcription with the patient, who gave verbal consent to proceed.   07/08/2023 4:46 PM   Courtney Pena Mar 19, 1940 [id number]  Referring provider: Jolanda Nation, NP [address] [place],  [place] [address]  Chief Complaint  Patient presents with   New Patient (Initial Visit)    HPI: Courtney Pena is a 83 y.o. female referred for evaluation of urinary frequency.   She states in September 2024, she had onset of urinary frequency, urgency and urge incontinence. She also has nocturia several times per night She states her only treatment has been a 3 day course of Macrobid . Denies dysuria or gross hematuria.  She has right lower quadrant abdominal pain which radiates to the suprapupic region. She describes this as a pulling sensation.  A renal ultrasound July 2024 showed bilateral renal cysts and a small right angiomyolipoma.    PMH: Past Medical History:  Diagnosis Date   Adenomatous polyp of colon 10/23/2021   Last colonoscopy was 02/2018 recommended 3 year follow up.       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  Did not tolerate zaralto for PAF.  Seems to tolerate ASA.[name]  Likely needs long term iron  to keep up with ongoing loss.     Arthritis    Atrial flutter (HCC) 12/07/2010   converted in ED with 300 mg flecainide    CAD (coronary artery disease)    a. mild per cath in 2004;  b. nonischemic Myoview  in March 2012;  c. Lex MV 1/14:  EF 66%, no ischemia   Cataract    Cold intolerance of hand 12/18/2021    Degenerative arthritis of thumb 02/12/2014   Disequilibrium 04/25/2006   Formatting of this note might be different from the original. Formatting of this note might be different from the original. Qualifier: Diagnosis of By: Bronson Canny MD, Sammie Crigler Last Assessment & Plan: Formatting of this note might be different from the original. Now describing ear fullness more on left side.  Perhaps the removal of cerumen impaction will help.  No further WU given previously seen by ENT   Diverticulosis of colon 04/25/2006   Qualifier: Diagnosis of   By: Bronson Canny MD, Sammie Crigler      Replacing diagnoses that were inactivated after the 05/28/22 regulatory import     DJD, UNSPECIFIED 04/25/2006   Qualifier: Diagnosis of   By: Bronson Canny MD, Sammie Crigler      Replacing diagnoses that were inactivated after the 05/28/22 regulatory import     Dysrhythmia    A-Fib. 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     Hyperlipidemia    Hypertension  Hypothyroidism    Neuromuscular disorder (HCC)    neuropathy in feet   Obesity    Personal history of colonic polyps 06/06/2009   cecal polyp   Rheumatoid arthritis (HCC)    Shingles 2023   Sleep apnea    lost weight no longer needs   Type 2 diabetes mellitus with diabetic chronic kidney disease (HCC) 04/25/2006        Surgical History: Past Surgical History:  Procedure Laterality Date   ARTERY BIOPSY N/A 08/31/2021   Procedure: LEFT TEMPORAL ARTERY BIOPSY;  Surgeon: Oza Blumenthal, MD;  Location: [facility];  Service: General;  Laterality: N/A;   BREAST BIOPSY Left    BREAST EXCISIONAL BIOPSY Left    benign   CARDIAC CATHETERIZATION   1999&2004   CARPAL TUNNEL RELEASE Bilateral 2003   CATARACT EXTRACTION Bilateral 1990   CHOLECYSTECTOMY N/A 07/05/2021   Procedure: LAPAROSCOPIC CHOLECYSTECTOMY;  Surgeon: Junie Olds, MD;  Location: [facility];  Service: General;  Laterality: N/A;   COLONOSCOPY     ESOPHAGOGASTRODUODENOSCOPY  08/13/2011   Procedure: ESOPHAGOGASTRODUODENOSCOPY (EGD);  Surgeon: Kenney Peacemaker, MD;  Location: [facility] ENDOSCOPY;  Service: Endoscopy;  Laterality: N/A;   implantable loop recorder placement  07/20/2020   Medtronic Reveal Linq model LNQ 22 ([place] [id number]) implantable loop recorder by Dr Nunzio Belch    Home Medications:  Allergies as of 07/08/2023       Reactions   Actos [pioglitazone] Swelling   Swelling all over body and moonface   Lisinopril Swelling   Swelling, may have had some breathing involvement. Angioedema   Nsaids Other (See Comments)   Patient reports internal bleeding. Experienced with tolmetin.    Penicillins Shortness Of Breath, Swelling   Arm Swelling with Penicillin (Occurred in 1960s) Breathing - throat swelling with Amoxicillin (Occurred prior to 2002)   Sulfamethoxazole Hives, Itching   "welps all over" immediately after dose   Flecainide  Other (See Comments)   Blurry  vision   Hctz [hydrochlorothiazide ] Other (See Comments)   Gout   Iodinated Contrast Media Itching   Pt. Developed mild itching after receiving IV cm; pt. Held; Dr. Kirke Pepper recomended she take 50 mg of benadryl  when she goes home-if necessary; Dr Marchia Seton recommends benadryl  prior to future exams requiring contrast media, but stated other doctors may recommend another premedication prep.   Prednisone  Other (See Comments)   Agitation, vivid dreams, anxiety does not want to take again.   Statins Other (See Comments)   Muscle aches with multiple statins. Able to tolerate rosuvastatin .   Gabapentin  Other (See Comments)   Caused dysphoria   Losartan  Potassium Swelling   Lower extremity swelling   Zetia   [ezetimibe ] Other (See Comments)   Cramps        Medication List        Accurate as of Jul 08, 2023  4:46 PM. If you have any questions, ask your nurse or doctor.          allopurinol  100 MG tablet Commonly known as: ZYLOPRIM  Take 1 tablet (100 mg total) by mouth daily.   blood glucose meter kit and supplies Dispense based on patient and insurance preference. Use up to four times daily as directed. (FOR ICD-10 E10.9, E11.9).   CoQ10 100 MG Caps Take 100 mg by mouth daily.   diclofenac  Sodium 1 % Gel Commonly known as: VOLTAREN  Apply 4 g topically 4 (four) times daily.   diltiazem  30 MG tablet Commonly known as: Cardizem  One tab by mouth every six hours as  needed for atrial fib   diltiazem  360 MG 24 hr capsule Commonly known as: CARDIZEM  CD Take 1 capsule (360 mg total) by mouth daily.   Eliquis  5 MG Tabs tablet Generic drug: apixaban  Take 5 mg by mouth 2 (two) times daily.   Farxiga  10 MG Tabs tablet Generic drug: dapagliflozin  propanediol Take 10 mg by mouth daily. 1 tablet by mouth each morning (AZ&Me PAP)   Insulin  Pen Needle 31G X 8 MM Misc Use with pen to inject once daily.   levothyroxine  112 MCG tablet Commonly known as: SYNTHROID  Take 1 tablet (112 mcg total) by mouth daily before breakfast.   meclizine  25 MG tablet Commonly known as: ANTIVERT  TAKE 1 TABLET (25 MG TOTAL) BY MOUTH AS NEEDED.   metFORMIN  500 MG tablet Commonly known as: GLUCOPHAGE  Take by mouth daily with breakfast.   Multi-Vitamins Tabs Take 1 tablet by mouth daily.   nitroGLYCERIN  0.4 MG SL tablet Commonly known as: NITROSTAT  Place 1 tablet (0.4 mg total) under the tongue every 5 (five) minutes as needed for chest pain (Call 911 if chest pain after three doses).   omeprazole  40 MG capsule Commonly known as: PRILOSEC Take 1 capsule (40 mg total) by mouth daily.   ONE TOUCH ULTRA 2 w/Device Kit 1 each by Does not apply route daily. Use glucometer to check blood sugar 3  times daily. E11.22   OneTouch Ultra test strip Generic drug: glucose blood 1 Container by Other route every morning.   OneTouch Ultra test strip Generic drug: glucose blood Use to check blood sugar up to three times daily as instructed. E11.22   onetouch ultrasoft lancets Use to check blood sugar up to 3 times daily. E11.22   PREVAGEN PO Take 1 tablet by mouth daily.   rosuvastatin  10 MG tablet Commonly known as: CRESTOR  Take 1 tablet (10 mg total) by mouth daily.   Semaglutide  (1 MG/DOSE) 4 MG/3ML Sopn Inject 1 mg as directed once a week.   trospium 20 MG tablet Commonly known as: SANCTURA Take 1 tablet (20 mg total) by mouth 2 (two) times daily. Started by: Geraline Knapp   VITAMIN D  (CHOLECALCIFEROL ) PO Take by mouth. 4000mg         Allergies:  Allergies  Allergen Reactions   Actos [Pioglitazone] Swelling    Swelling all over body and moonface   Lisinopril Swelling    Swelling, may have had some breathing involvement. Angioedema   Nsaids Other (See Comments)    Patient reports internal bleeding. Experienced with tolmetin.    Penicillins Shortness Of Breath and Swelling    Arm Swelling with Penicillin (Occurred in 1960s) Breathing - throat swelling with Amoxicillin (Occurred prior to 2002)   Sulfamethoxazole Hives and Itching    "welps all over" immediately after dose   Flecainide  Other (See Comments)    Blurry  vision   Hctz [Hydrochlorothiazide ] Other (See Comments)    Gout   Iodinated Contrast Media Itching    Pt. Developed mild itching after receiving IV cm; pt. Held; Dr. Kirke Pepper recomended she take 50 mg of benadryl  when she goes home-if necessary; Dr Marchia Seton recommends benadryl  prior to future exams requiring contrast media, but stated other doctors may recommend another premedication prep.   Prednisone  Other (See Comments)    Agitation, vivid dreams, anxiety does not want to take again.    Statins Other (See Comments)    Muscle aches with  multiple statins. Able to tolerate rosuvastatin .   Gabapentin  Other (See Comments)  Caused dysphoria    Losartan  Potassium Swelling    Lower extremity swelling   Zetia  [Ezetimibe ] Other (See Comments)    Cramps    Family History: Family History  Problem Relation Age of Onset   Heart disease Father    Diabetes Maternal Grandfather    Hypertension Mother    Other Mother        brain tumor-benign   Diabetes Daughter        pre-diabeties   Colon polyps Daughter    Thyroid  disease Daughter        x 2   Colon cancer Neg Hx    Esophageal cancer Neg Hx    Stomach cancer Neg Hx    Rectal cancer Neg Hx     Social History:  reports that she quit smoking about 30 years ago. Her smoking use included cigarettes. She started smoking about 60 years ago. She has a 60 pack-year smoking history. She has been exposed to tobacco smoke. She has never used smokeless tobacco. She reports that she does not drink alcohol and does not use drugs.   Physical Exam: BP (!) 182/95   Pulse 76   Ht 5\' 4"  (1.626 m)   Wt 155 lb (70.3 kg)   BMI 26.61 kg/m   Constitutional:  Alert and oriented, No acute distress. HEENT: [place] AT Respiratory: Normal respiratory effort, no increased work of breathing. Psychiatric: Normal mood and affect.   Urinalysis Dipstick 2+ protein, microscopy 6-10 WBC/>10 epis   Pertinent Imaging: Ultrasound was personally reviewed and interpreted.   US  Renal  Narrative CLINICAL DATA:  Acute on chronic renal insufficiency  EXAM: RENAL / URINARY TRACT ULTRASOUND COMPLETE  COMPARISON:  02/01/2022, 11/06/2020  FINDINGS: Right Kidney:  Renal measurements: 12.1 x 6.4 x 6.2 cm = volume: 251.5 mL. Increased renal cortical echotexture consistent with medical renal disease. No hydronephrosis or nephrolithiasis. Multiple simple appearing renal cysts are identified, largest measuring 4.7 cm. A 1.2 x 0.8 x 1.0 cm hyperechoic focus within the anterior aspect upper pole right  kidney may reflect a small renal angiomyolipoma.  Left Kidney:  Renal measurements: 12.2 x 6.0 x 4.7 cm = volume: 179.9 mL. Increased renal cortical echotexture consistent with medical renal disease. Multiple simple appearing left renal cysts, largest measuring 2.4 cm. No hydronephrosis or nephrolithiasis.  Bladder:  Appears normal for degree of bladder distention.  Other:  None.  IMPRESSION: 1. Increased renal cortical echotexture consistent with medical renal disease. 2. Multiple simple appearing bilateral renal cysts. 3. Small stable right renal angiomyolipoma.   Electronically Signed By: Bobbye Burrow M.D. On: 09/06/2022 12:06   Assessment & Plan:    1. Lower urinary tract symptoms We discussed overactive bladder is the most common cause of her symptoms.  Trial trospium 20 mg BID PVR today 0 mL  2. Right lower quadrant abdominal pain/pelvic pain Schedule non-contrast CT abdomen pelvis for further evaluation.  I have reviewed the above documentation for accuracy and completeness, and I agree with the above.   Geraline Knapp, MD  [facility] [address] [place], [place] [address] [phone]

## 2023-07-09 LAB — CUP PACEART REMOTE DEVICE CHECK
Date Time Interrogation Session: 20250511233446
Implantable Pulse Generator Implant Date: 20220525

## 2023-07-11 ENCOUNTER — Other Ambulatory Visit: Payer: Self-pay | Admitting: General Practice

## 2023-07-11 DIAGNOSIS — E1122 Type 2 diabetes mellitus with diabetic chronic kidney disease: Secondary | ICD-10-CM

## 2023-07-11 DIAGNOSIS — I5032 Chronic diastolic (congestive) heart failure: Secondary | ICD-10-CM

## 2023-07-12 ENCOUNTER — Telehealth: Payer: Self-pay

## 2023-07-12 ENCOUNTER — Ambulatory Visit: Payer: Self-pay

## 2023-07-12 ENCOUNTER — Ambulatory Visit: Payer: Self-pay | Admitting: Cardiovascular Disease

## 2023-07-12 NOTE — Telephone Encounter (Signed)
 Copied from CRM [id number]. Topic: General - Other >> Jul 12, 2023  9:57 AM Albertha Alosa wrote: Reason for CRM: Patient called in wanting to speak  Heidi Llamas the Pharmacist, is asking that she gives her a callback

## 2023-07-12 NOTE — Telephone Encounter (Signed)
 Reason for Triage: Patient states she's having issues with body, skin from top of head to waist is itching like crazy for about a week, more so on arms, breast, chest ,skin looks shriveled up and bumps but the "itch is unreal"   Courtney Pena [phone]    Chief Complaint: Itching all over. No rash "yet." Has hydroxyzine  from "last year when this happened." Symptoms: above Frequency: this week Pertinent Negatives: Patient denies any new soaps or lotions, soap. Disposition: [] ED /[] Urgent Care (no appt availability in office) / [x] Appointment(In office/virtual)/ []  [facility]/ [x] Home Care/ [] Refused Recommended Disposition /[] [facility]/ []  Follow-up with PCP Additional Notes: agrees with appointment.  Reason for Disposition  [1] Widespread itching AND [2] cause unknown AND [3] present > 48 hours  (Exception: Caller knows the cause and can eliminate it.)  Answer Assessment - Initial Assessment Questions 1. DESCRIPTION: "Describe the itching you are having."     Itching 2. SEVERITY: "How bad is it?"    - MILD: Doesn't interfere with normal activities.   - MODERATE-SEVERE: Interferes with work, school, sleep, or other activities.      Moderate 3. SCRATCHING: "Are there any scratch marks? Bleeding?"     yes 4. ONSET: "When did this begin?"      1 week 5. CAUSE: "What do you think is causing the itching?" (ask about swimming pools, pollen, animals, soaps, etc.)     unsure 6. OTHER SYMPTOMS: "Do you have any other symptoms?"      No 7. PREGNANCY: "Is there any chance you are pregnant?" "When was your last menstrual period?"     No  Protocols used: Itching - [facility], [place]

## 2023-07-14 NOTE — Telephone Encounter (Signed)
 Noted

## 2023-07-15 ENCOUNTER — Other Ambulatory Visit: Payer: Self-pay | Admitting: Pharmacist

## 2023-07-15 ENCOUNTER — Encounter: Payer: Self-pay | Admitting: General Practice

## 2023-07-15 ENCOUNTER — Ambulatory Visit (INDEPENDENT_AMBULATORY_CARE_PROVIDER_SITE_OTHER): Admitting: General Practice

## 2023-07-15 VITALS — BP 124/68 | HR 73 | Temp 97.8°F | Ht 64.0 in | Wt 155.0 lb

## 2023-07-15 DIAGNOSIS — I10 Essential (primary) hypertension: Secondary | ICD-10-CM | POA: Diagnosis not present

## 2023-07-15 DIAGNOSIS — L299 Pruritus, unspecified: Secondary | ICD-10-CM | POA: Diagnosis not present

## 2023-07-15 MED ORDER — DILTIAZEM HCL ER COATED BEADS 360 MG PO CP24: 360.0000 mg | ORAL_CAPSULE | Freq: Every day | ORAL | 1 refills | Status: AC

## 2023-07-15 MED ORDER — HYDROXYZINE HCL 10 MG PO TABS: 10.0000 mg | ORAL_TABLET | Freq: Two times a day (BID) | ORAL | 0 refills | Status: AC | PRN

## 2023-07-15 NOTE — Progress Notes (Signed)
 Established Patient Office Visit  Subjective   Patient ID: Courtney Pena, female    DOB: 04-08-1940  Age: 83 y.o. MRN: [id number]  Chief Complaint  Patient presents with   Pruritis    From head to waist x 2 weeks. Patient states its very itchy and burning feeling. Took hydroxyzine  in the past which helped but took it again this time and has not helped at all. Has also used eucerin for itching and helps some but not enough and comes right back. Patient states rash is worse at night.     HPI  Courtney Pena is a 83 year old female with past medical history of type 2 DM  Pruritus: started two weeks ago. Significant itching of skin and "deep burning" subcutaneous (full body fromt op of head down to waist with no involvement of lower extremity). No visible rash or hives. She has not made any new changes to her diet, detergent or medication.  Has experienced similar reaction in the past. Saw dermatology and was given hydroxyzine  which was not effective. She has also tried cetrizine with no relief. Her last reaction persisted for months with similar pattern of skin involvement. She is not sure if the last reaction also occurred in spring time. She is concerned for an allergy.   She has been using Cereve moisturizer and hydroxyzine  for four days now and has increased her water intake.   Patient Active Problem List   Diagnosis Date Noted   Pruritus 07/15/2023   Branch retinal vein occlusion of right eye with macular edema 05/15/2023   Itchy eyes 01/02/2023   History of gout 08/28/2022   Orthostatic hypotension 08/02/2022   Dizziness 07/02/2022   Subacute cough 07/02/2022   Right foot drop 05/07/2022   Tenosynovitis of left foot 10/19/2020   Hypercoagulable state due to paroxysmal atrial fibrillation (HCC) 10/03/2020   Rheumatoid arthritis (HCC) 03/21/2020   Tinnitus 03/21/2020   Idiopathic chronic gout of multiple sites without tophus 12/09/2019   Lumbar back pain  with radiculopathy affecting left lower extremity 07/30/2019   Rotator cuff syndrome of right shoulder 11/24/2018   Hypertensive retinopathy of both eyes 10/14/2018   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 10/14/2018   Vitreomacular adhesion of left eye 10/14/2018   Diabetic retinopathy, nonproliferative, moderate (HCC) 03/07/2018   Chronic diastolic CHF (congestive heart failure) (HCC) 08/16/2017   Sudden hearing loss, left 09/25/2016   Chronic anticoag - Xarelto  05//2017, CHADS2CVASC=5 Eliquis  9/20 07/07/2015   Vitamin D  deficiency 09/18/2013   Hyperlipidemia associated with type 2 diabetes mellitus (HCC) 08/22/2012   Pseudophakia, both eyes 03/12/2012   Presence of intraocular lens 03/12/2012   Pulmonary nodule 12/26/2010   Paroxysmal atrial fibrillation (HCC) 12/14/2010   Urinary frequency 07/13/2010   Hypothyroidism 04/25/2006   Type 2 diabetes mellitus with diabetic chronic kidney disease (HCC) 04/25/2006   HYPERCHOLESTEROLEMIA 04/25/2006   HYPERTENSION, BENIGN SYSTEMIC 04/25/2006   Coronary atherosclerosis 04/25/2006   Reflux esophagitis 04/25/2006   VERTIGO NOS OR DIZZINESS 04/25/2006   Past Medical History:  Diagnosis Date   Adenomatous polyp of colon 10/23/2021   Last colonoscopy was 02/2018 recommended 3 year follow up.       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  Did not tolerate zaralto for PAF.  Seems to tolerate ASA.[name]  Likely needs long  term iron  to keep up with ongoing loss.     Arthritis    Atrial flutter (HCC) 12/07/2010   converted in ED with 300 mg flecainide    CAD (coronary artery disease)    a. mild per cath in 2004;  b. nonischemic Myoview  in March 2012;  c. Lex MV 1/14:  EF 66%, no ischemia   Cataract    Cold intolerance of hand 12/18/2021   Degenerative arthritis of thumb  02/12/2014   Disequilibrium 04/25/2006   Formatting of this note might be different from the original. Formatting of this note might be different from the original. Qualifier: Diagnosis of By: Bronson Canny MD, Sammie Crigler Last Assessment & Plan: Formatting of this note might be different from the original. Now describing ear fullness more on left side.  Perhaps the removal of cerumen impaction will help.  No further WU given previously seen by ENT   Diverticulosis of colon 04/25/2006   Qualifier: Diagnosis of   By: Bronson Canny MD, Sammie Crigler      Replacing diagnoses that were inactivated after the 05/28/22 regulatory import     DJD, UNSPECIFIED 04/25/2006   Qualifier: Diagnosis of   By: Bronson Canny MD, Sammie Crigler      Replacing diagnoses that were inactivated after the 05/28/22 regulatory import     Dysrhythmia    A-Fib. 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     Hyperlipidemia    Hypertension    Hypothyroidism    Neuromuscular disorder (HCC)    neuropathy in feet   Obesity    Personal history of colonic polyps 06/06/2009   cecal polyp   Rheumatoid arthritis (HCC)    Shingles 2023   Sleep apnea    lost weight no longer needs   Type 2 diabetes mellitus with diabetic chronic kidney disease (HCC) 04/25/2006       Past Surgical History:  Procedure Laterality Date   ARTERY BIOPSY N/A 08/31/2021   Procedure: LEFT TEMPORAL ARTERY BIOPSY;  Surgeon: Oza Blumenthal, MD;  Location: [facility];  Service: General;  Laterality: N/A;   BREAST BIOPSY Left    BREAST EXCISIONAL BIOPSY Left    benign   CARDIAC CATHETERIZATION  1999&2004   CARPAL TUNNEL RELEASE Bilateral 2003   CATARACT  EXTRACTION Bilateral 1990   CHOLECYSTECTOMY N/A 07/05/2021   Procedure: LAPAROSCOPIC CHOLECYSTECTOMY;  Surgeon: Junie Olds, MD;  Location: [facility];  Service: General;  Laterality: N/A;   COLONOSCOPY     ESOPHAGOGASTRODUODENOSCOPY  08/13/2011   Procedure: ESOPHAGOGASTRODUODENOSCOPY (EGD);  Surgeon: Kenney Peacemaker, MD;  Location: [facility] ENDOSCOPY;  Service: Endoscopy;  Laterality: N/A;   implantable loop recorder placement  07/20/2020   Medtronic Reveal Linq model LNQ 22 ([place] [id number]) implantable loop recorder by Dr Nunzio Belch   Allergies  Allergen Reactions   Actos [Pioglitazone] Swelling    Swelling all over body and moonface   Lisinopril Swelling    Swelling, may have had some breathing involvement. Angioedema   Nsaids Other (See Comments)    Patient reports internal bleeding. Experienced with tolmetin.    Penicillins Shortness Of Breath and Swelling    Arm Swelling with  Penicillin (Occurred in 1960s) Breathing - throat swelling with Amoxicillin (Occurred prior to 2002)   Sulfamethoxazole Hives and Itching    "welps all over" immediately after dose   Flecainide  Other (See Comments)    Blurry  vision   Hctz [Hydrochlorothiazide ] Other (See Comments)    Gout   Iodinated Contrast Media Itching    Pt. Developed mild itching after receiving IV cm; pt. Held; Dr. Kirke Pepper recomended she take 50 mg of benadryl  when she goes home-if necessary; Dr Marchia Seton recommends benadryl  prior to future exams requiring contrast media, but stated other doctors may recommend another premedication prep.   Prednisone  Other (See Comments)    Agitation, vivid dreams, anxiety does not want to take again.    Statins Other (See Comments)    Muscle aches with multiple statins. Able to tolerate rosuvastatin .   Gabapentin  Other (See Comments)    Caused dysphoria    Losartan  Potassium Swelling    Lower extremity swelling   Zetia  [Ezetimibe ] Other (See Comments)    Cramps         07/15/2023    3:00 PM  07/02/2023   11:56 AM 06/28/2023    2:56 PM  Depression screen PHQ 2/9  Decreased Interest 0 0 0  Down, Depressed, Hopeless 0 0 0  PHQ - 2 Score 0 0 0  Altered sleeping 0 0 0  Tired, decreased energy 0 0 0  Change in appetite 0 0 0  Feeling bad or failure about yourself  0 0 0  Trouble concentrating 0 0 0  Moving slowly or fidgety/restless 0 0 0  Suicidal thoughts 0 0 0  PHQ-9 Score 0 0 0  Difficult doing work/chores Not difficult at all Not difficult at all Not difficult at all       07/15/2023    3:00 PM 06/28/2023    2:56 PM 05/15/2023   10:08 AM  GAD 7 : Generalized Anxiety Score  Nervous, Anxious, on Edge 0 0 0  Control/stop worrying 0 0 0  Worry too much - different things 0 0 0  Trouble relaxing 0 0 0  Restless 0 0 0  Easily annoyed or irritable 0 0 0  Afraid - awful might happen 0 0 0  Total GAD 7 Score 0 0 0  Anxiety Difficulty Not difficult at all Not difficult at all Not difficult at all      Review of Systems  Constitutional:  Negative for chills and fever.  Respiratory:  Negative for shortness of breath.   Cardiovascular:  Negative for chest pain.  Gastrointestinal:  Negative for abdominal pain, constipation, diarrhea, heartburn, nausea and vomiting.  Genitourinary:  Negative for dysuria, frequency and urgency.  Skin:  Positive for itching.  Neurological:  Negative for dizziness and headaches.  Endo/Heme/Allergies:  Negative for polydipsia.  Psychiatric/Behavioral:  Negative for depression and suicidal ideas. The patient is not nervous/anxious.       Objective:     BP 124/68 (BP Location: Left Arm, Patient Position: Sitting, Cuff Size: Normal)   Pulse 73   Temp 97.8 F (36.6 C) (Oral)   Ht 5\' 4"  (1.626 m)   Wt 155 lb (70.3 kg)   SpO2 97%   BMI 26.61 kg/m  BP Readings from Last 3 Encounters:  07/15/23 124/68  07/08/23 (!) 182/95  07/02/23 134/64   Wt Readings from Last 3 Encounters:  07/15/23 155 lb (70.3 kg)  07/08/23 155 lb (70.3 kg)  07/02/23  155 lb (70.3 kg)  Physical Exam Vitals and nursing note reviewed.  Constitutional:      Appearance: Normal appearance.  Cardiovascular:     Rate and Rhythm: Normal rate and regular rhythm.     Pulses: Normal pulses.     Heart sounds: Normal heart sounds.  Pulmonary:     Effort: Pulmonary effort is normal.     Breath sounds: Normal breath sounds.  Neurological:     Mental Status: She is alert and oriented to person, place, and time.  Psychiatric:        Mood and Affect: Mood normal.        Behavior: Behavior normal.        Thought Content: Thought content normal.        Judgment: Judgment normal.      No results found for any visits on 07/15/23.     The ASCVD Risk score (Arnett DK, et al., 2019) failed to calculate for the following reasons:   The 2019 ASCVD risk score is only valid for ages 51 to 61    Assessment & Plan:  Pruritus Assessment & Plan: Unclear etiology.   Given that there were no changes, suspect it could be environmental.   Will refer to allergy for skin testing.   Recommend aveeno senstive skin body wash and lotion, continue cereve and Hydroxyzine  10 mg BID PRN. Refill sent.  Also recommend f/u with dermatology but patient declines at this time.  Orders: -     Ambulatory referral to Allergy -     hydrOXYzine  HCl; Take 1 tablet (10 mg total) by mouth 2 (two) times daily as needed for itching.  Dispense: 30 tablet; Refill: 0  HYPERTENSION, BENIGN SYSTEMIC Assessment & Plan: BP at goal today.  Continue Cardizem  360 mg once daily. Refill sent.  Continue Cardizem  30 mg PRN.   Reviewed echo results with the patient.   She has a f/u with cardiology in June.  Orders: -     dilTIAZem  HCl ER Coated Beads; Take 1 capsule (360 mg total) by mouth daily.  Dispense: 90 capsule; Refill: 1     Return in about 3 months (around 10/15/2023) for chronic care management. Jolanda Nation, NP

## 2023-07-15 NOTE — Assessment & Plan Note (Signed)
 BP at goal today.  Continue Cardizem  360 mg once daily. Refill sent.  Continue Cardizem  30 mg PRN.   Reviewed echo results with the patient.   She has a f/u with cardiology in June.

## 2023-07-15 NOTE — Patient Instructions (Addendum)
 Try Aveeno skin itch body wash.   Start Trospium  per urology.   Referral placed for allergy testing.   Continue using the lotion and hydroxyzine .   It was a pleasure to see you today!

## 2023-07-15 NOTE — Assessment & Plan Note (Signed)
 Unclear etiology.   Given that there were no changes, suspect it could be environmental.   Will refer to allergy for skin testing.   Recommend aveeno senstive skin body wash and lotion, continue cereve and Hydroxyzine  10 mg BID PRN. Refill sent.  Also recommend f/u with dermatology but patient declines at this time.

## 2023-07-15 NOTE — Progress Notes (Addendum)
 Brief Telephone Documentation Reason for Call: Patient left message regarding question for pharmacist Friday afternoon. Out of office.   Summary of Call: Returned patient call Monday morning. Patient reports she was called to request PCP appointment for acute visit which she was able to schedule on Friday for Monday afternoon.   No further action needed at this time. PCP visit today.   Patient-reported HPI: Dermatologic concern - Significant itching of the skin and "deep burning" subcutaneous (full body from top of head down to waist, no involvement of lower extremity) Denies involvement of any other systems (airway, musosa, eyes, etc.) Has experienced similar reaction in the past. Saw dermatology who prescribed hydroxyzine  which did not help. Patient notes seeing dermatology again for current flare for which they again gave her hydroxyzine  with no perceived benefit. No benefit with OTC cetirizine.  Her last reaction persisted for months with similar pattern of skin involvement. She notes it eventually self-resolved though she had lost significant amounts of hair from the scalp during the healing process. Affected eyes before.   Has been using Cereve moisturizer and hydroxyzine  x4 days. Increased water intake.   Denies changes  No new detergents, clothing items, cleaning products, skin products/shampoos.  No new activities such as gardening, time outside, extreme temperatures.  No new foods or changes in medications/supplements.   Current Med List Reviewed.  Considerations to rule out Allopurinol  - Can be associated with dermatologic reactions (Exfoliative dermatitis/erythroderma - diffuse erythema and scaling). Physical examination of skin needed. Appointment scheduled for this afternoon.  No hx documented in Epic of HLA testing for the HLA-B*5801 allele for risk of developing severe cutaneous adverse reactions (SCAR) -- unclear if obtained previously at dermatology office   Follow  Up: Patient given direct line for further questions/concerns.  Daron Ellen, PharmD Clinical Pharmacist [facility] [phone]

## 2023-07-16 ENCOUNTER — Telehealth: Payer: Self-pay | Admitting: General Practice

## 2023-07-16 NOTE — Telephone Encounter (Signed)
 Patient came to office and picked up her medication.

## 2023-07-18 ENCOUNTER — Encounter: Payer: Self-pay | Admitting: *Deleted

## 2023-07-18 ENCOUNTER — Other Ambulatory Visit: Payer: Self-pay

## 2023-07-18 ENCOUNTER — Emergency Department

## 2023-07-18 ENCOUNTER — Emergency Department
Admission: EM | Admit: 2023-07-18 | Discharge: 2023-07-18 | Disposition: A | Discharge status: Home / Self Care | Attending: Emergency Medicine | Admitting: Emergency Medicine

## 2023-07-18 DIAGNOSIS — E119 Type 2 diabetes mellitus without complications: Secondary | ICD-10-CM | POA: Insufficient documentation

## 2023-07-18 DIAGNOSIS — Y92009 Unspecified place in unspecified non-institutional (private) residence as the place of occurrence of the external cause: Secondary | ICD-10-CM | POA: Insufficient documentation

## 2023-07-18 DIAGNOSIS — G4489 Other headache syndrome: Secondary | ICD-10-CM | POA: Diagnosis not present

## 2023-07-18 DIAGNOSIS — I10 Essential (primary) hypertension: Secondary | ICD-10-CM | POA: Diagnosis not present

## 2023-07-18 DIAGNOSIS — E039 Hypothyroidism, unspecified: Secondary | ICD-10-CM | POA: Diagnosis not present

## 2023-07-18 DIAGNOSIS — M542 Cervicalgia: Secondary | ICD-10-CM | POA: Diagnosis not present

## 2023-07-18 DIAGNOSIS — M5030 Other cervical disc degeneration, unspecified cervical region: Secondary | ICD-10-CM | POA: Diagnosis not present

## 2023-07-18 DIAGNOSIS — M19011 Primary osteoarthritis, right shoulder: Secondary | ICD-10-CM | POA: Diagnosis not present

## 2023-07-18 DIAGNOSIS — W19XXXA Unspecified fall, initial encounter: Secondary | ICD-10-CM | POA: Diagnosis not present

## 2023-07-18 DIAGNOSIS — S0083XA Contusion of other part of head, initial encounter: Secondary | ICD-10-CM | POA: Diagnosis not present

## 2023-07-18 DIAGNOSIS — Z7901 Long term (current) use of anticoagulants: Secondary | ICD-10-CM | POA: Diagnosis not present

## 2023-07-18 DIAGNOSIS — S0003XA Contusion of scalp, initial encounter: Secondary | ICD-10-CM | POA: Diagnosis not present

## 2023-07-18 DIAGNOSIS — I672 Cerebral atherosclerosis: Secondary | ICD-10-CM | POA: Diagnosis not present

## 2023-07-18 DIAGNOSIS — S40011A Contusion of right shoulder, initial encounter: Secondary | ICD-10-CM | POA: Diagnosis not present

## 2023-07-18 DIAGNOSIS — M4802 Spinal stenosis, cervical region: Secondary | ICD-10-CM | POA: Diagnosis not present

## 2023-07-18 DIAGNOSIS — S199XXA Unspecified injury of neck, initial encounter: Secondary | ICD-10-CM | POA: Diagnosis not present

## 2023-07-18 DIAGNOSIS — R6889 Other general symptoms and signs: Secondary | ICD-10-CM | POA: Diagnosis not present

## 2023-07-18 DIAGNOSIS — S0990XA Unspecified injury of head, initial encounter: Secondary | ICD-10-CM | POA: Diagnosis not present

## 2023-07-18 DIAGNOSIS — M4312 Spondylolisthesis, cervical region: Secondary | ICD-10-CM | POA: Diagnosis not present

## 2023-07-18 DIAGNOSIS — W01198A Fall on same level from slipping, tripping and stumbling with subsequent striking against other object, initial encounter: Secondary | ICD-10-CM | POA: Insufficient documentation

## 2023-07-18 DIAGNOSIS — Z043 Encounter for examination and observation following other accident: Secondary | ICD-10-CM | POA: Diagnosis not present

## 2023-07-18 MED ORDER — APIXABAN 5 MG PO TABS: 5.0000 mg | ORAL_TABLET | Freq: Once | ORAL | Status: DC

## 2023-07-18 MED ORDER — ACETAMINOPHEN 325 MG PO TABS
650.0000 mg | ORAL_TABLET | Freq: Once | ORAL | Status: AC
  Administered 2023-07-18: 650 mg via ORAL
  Filled 2023-07-18: qty 2

## 2023-07-18 MED ORDER — DILTIAZEM HCL ER COATED BEADS 180 MG PO CP24
360.0000 mg | ORAL_CAPSULE | Freq: Once | ORAL | Status: AC
  Administered 2023-07-18: 360 mg via ORAL
  Filled 2023-07-18: qty 2

## 2023-07-18 MED ORDER — LIDOCAINE 5 % EX PTCH
1.0000 | MEDICATED_PATCH | Freq: Once | CUTANEOUS | Status: DC
  Administered 2023-07-18: 1 via TRANSDERMAL
  Filled 2023-07-18: qty 1

## 2023-07-18 NOTE — ED Triage Notes (Signed)
 First nurse note: Pt to ED via [facility] from home. Pt reports Mechanical fall after house alarm was sounding and was running back inside to turn it off. Pt fell and face hit TV stand. Pt has swelling to right side of face. Pt is on Eliquis . NO LOC. Did not take meds today.   EMS VS:  180/90 HR 80 99%  CBG 114

## 2023-07-18 NOTE — ED Triage Notes (Addendum)
 Pt brought in via ems from home.  Pt fell after tripping over a pair of shoes striking a coffee table.  No loc   no lac/abrasions.  Pt has hematoma to right side of head.  Pt also has pain in right side of face.  Pt alert  speech clear.  Denies neck and back pain.   Pt is on blood thinners

## 2023-07-18 NOTE — ED Provider Notes (Signed)
 [facility] Emergency Department Provider Note     Event Date/Time   First MD Initiated Contact with Patient 07/18/23 1748     (approximate)   History   Fall   HPI  Courtney Pena is a 83 y.o. female with a history of hypertension, diabetes, hypothyroidism, RA, and A-fib on Eliquis , presents to the ED following a mechanical fall at home.  Patient tripped on a pair shoes in her home, striking her right side including head and shoulder on a coffee table.  No reports of any LOC.  She presents with some swelling to the right side of the forehead but denies any frank weakness or difficulty.  She would endorse some pain to the right side of the neck as well as the right shoulder.  She presents to the ED via EMS for evaluation of her symptoms.  Her adult daughter is at bedside.  Patient is able to provide the full HPI.  Physical Exam   Triage Vital Signs: ED Triage Vitals  Encounter Vitals Group     BP 07/18/23 1530 (!) 194/107     Systolic BP Percentile --      Diastolic BP Percentile --      Pulse Rate 07/18/23 1530 73     Resp 07/18/23 1530 18     Temp 07/18/23 1530 98.6 F (37 C)     Temp Source 07/18/23 1530 Oral     SpO2 07/18/23 1530 100 %     Weight 07/18/23 1526 157 lb (71.2 kg)     Height 07/18/23 1526 5\' 4"  (1.626 m)     Head Circumference --      Peak Flow --      Pain Score 07/18/23 1526 8     Pain Loc --      Pain Education --      Exclude from Growth Chart --     Most recent vital signs: Vitals:   07/18/23 1530 07/18/23 1836  BP: (!) 194/107 (!) 198/84  Pulse: 73 72  Resp: 18 18  Temp: 98.6 F (37 C)   SpO2: 100% 100%    General Awake, no distress. NAD HEENT NCAT, except for small hematoma to the right forehead. PERRL. EOMI. No rhinorrhea. Mucous membranes are moist.  CV:  Good peripheral perfusion.  RESP:  Normal effort.  ABD:  No distention.  MSK:  Normal spinal alignment without midline tenderness, spasm,  vomiting, or step-off.  Patient tender to palpation to the right lateral neck.  Normal range of motion is noted.  Right shoulder without obvious deformity, dislocation, or sulcus sign.  Normal composite fist distally.   ED Results / Procedures / Treatments   Labs (all labs ordered are listed, but only abnormal results are displayed) Labs Reviewed - No data to display   EKG   RADIOLOGY  I personally viewed and evaluated these images as part of my medical decision making, as well as reviewing the written report by the radiologist.  ED Provider Interpretation: No acute findings of the CT images.  Right x-ray no evidence of fracture or dislocation  DG Shoulder Right Result Date: 07/18/2023 CLINICAL DATA:  Status post fall. EXAM: RIGHT SHOULDER - 2+ VIEW COMPARISON:  None Available. FINDINGS: There is no evidence of acute fracture or dislocation. Marked severity degenerative changes are seen involving the right acromioclavicular joint and right glenohumeral articulation mild, chronic subluxation of the right humeral head is also noted. Soft tissues are unremarkable. IMPRESSION: Marked  severity degenerative changes without evidence of an acute osseous abnormality. Electronically Signed   By: Virgle Grime M.D.   On: 07/18/2023 19:46   CT Cervical Spine Wo Contrast Result Date: 07/18/2023 CLINICAL DATA:  Neck trauma (Age >= 65y) Fall at home. EXAM: CT CERVICAL SPINE WITHOUT CONTRAST TECHNIQUE: Multidetector CT imaging of the cervical spine was performed without intravenous contrast. Multiplanar CT image reconstructions were also generated. RADIATION DOSE REDUCTION: This exam was performed according to the departmental dose-optimization program which includes automated exposure control, adjustment of the mA and/or kV according to patient size and/or use of iterative reconstruction technique. COMPARISON:  Cervical spine CT 01/04/2022 FINDINGS: Alignment: Straightening of normal lordosis. Again seen  2 mm anterolisthesis of C3 on C4. No traumatic subluxation. Skull base and vertebrae: No acute fracture. Vertebral body heights are maintained. The dens and skull base are intact. Moderate C1-C2 degenerative pannus. Soft tissues and spinal canal: No prevertebral fluid or swelling. No visible canal hematoma. Disc levels: Diffuse disc space narrowing and spurring, most prominently affecting C4-C5 and C5-C6. Mild multilevel facet hypertrophy. Upper chest: No acute findings. Other: Carotid calcifications. IMPRESSION: 1. No acute fracture or subluxation of the cervical spine. 2. Multilevel degenerative disc disease and facet hypertrophy. Electronically Signed   By: Chadwick Colonel M.D.   On: 07/18/2023 18:35   CT Head Wo Contrast Result Date: 07/18/2023 CLINICAL DATA:  Head trauma, minor (Age >= 65y) fall Tripped at home leading to fall.  Hematoma to right side of head. EXAM: CT HEAD WITHOUT CONTRAST TECHNIQUE: Contiguous axial images were obtained from the base of the skull through the vertex without intravenous contrast. RADIATION DOSE REDUCTION: This exam was performed according to the departmental dose-optimization program which includes automated exposure control, adjustment of the mA and/or kV according to patient size and/or use of iterative reconstruction technique. COMPARISON:  Head CT 10/24/2022 FINDINGS: Brain: No intracranial hemorrhage, mass effect, or midline shift. Brain volume is normal for age. No hydrocephalus. The basilar cisterns are patent. No evidence of territorial infarct or acute ischemia. No extra-axial or intracranial fluid collection. Vascular: Atherosclerosis of skullbase vasculature without hyperdense vessel or abnormal calcification. Skull: No fracture or focal lesion. Chronic small right occipital osteoma. Sinuses/Orbits: Partial opacification of lower left mastoid air cells. Paranasal sinuses are clear. Other: Small right frontal scalp hematoma. IMPRESSION: Small right frontal scalp  hematoma. No acute intracranial abnormality. No skull fracture. Electronically Signed   By: Chadwick Colonel M.D.   On: 07/18/2023 18:10    PROCEDURES:  Critical Care performed: No  Procedures   MEDICATIONS ORDERED IN ED: Medications  apixaban  (ELIQUIS ) tablet 5 mg (has no administration in time range)  acetaminophen  (TYLENOL ) tablet 650 mg (650 mg Oral Given 07/18/23 1925)  diltiazem  (CARDIZEM  CD) 24 hr capsule 360 mg (360 mg Oral Given 07/18/23 1924)     IMPRESSION / MDM / ASSESSMENT AND PLAN / ED COURSE  I reviewed the triage vital signs and the nursing notes.                              Differential diagnosis includes, but is not limited to, SDH, skull fracture, cervical strain, cervical fracture, radiculopathy  Patient's presentation is most consistent with acute presentation with potential threat to life or bodily function.  Patient's diagnosis is consistent with mechanical fall resulting in minor head contusion and right shoulder contusion.  Geriatric patient in no acute distress following mechanical fall.  Her exam  including x-rays and CT scans are normal and reassuring.  I reviewed and interpreted the available CT and plain films with without evidence of any acute injury or intracranial process.  Patient will be discharged home with instructions to take OTC Tylenol  as needed. Patient is to follow up with her primary provider as discussed, as needed or otherwise directed. Patient is given ED precautions to return to the ED for any worsening or new symptoms.     FINAL CLINICAL IMPRESSION(S) / ED DIAGNOSES   Final diagnoses:  Fall in home, initial encounter  Minor head injury, initial encounter  Contusion of right shoulder, initial encounter     Rx / DC Orders   ED Discharge Orders     None        Note:  This document was prepared using Dragon voice recognition software and may include unintentional dictation errors.    May Sparks, PA-C 07/18/23  2004    Twilla Galea, MD 07/18/23 [phone]

## 2023-07-18 NOTE — Discharge Instructions (Addendum)
 Your exam, labs, x-rays, and CT scans are normal and reassuring at this time.  No signs of any serious injury related to your fall.  Your x-ray of the shoulder shows arthritis but no obvious fracture or dislocation.  Take Tylenol  at home as needed for pain relief.  Follow-up with your primary provider or return to the ED as needed.

## 2023-07-23 ENCOUNTER — Other Ambulatory Visit: Payer: Self-pay | Admitting: General Practice

## 2023-07-23 DIAGNOSIS — L299 Pruritus, unspecified: Secondary | ICD-10-CM

## 2023-07-24 NOTE — Addendum Note (Signed)
 Addended by: Edra Govern D on: 07/24/2023 12:43 PM   Modules accepted: Orders

## 2023-07-24 NOTE — Progress Notes (Signed)
 [facility] Summary Report / Loop Recorder

## 2023-08-05 ENCOUNTER — Ambulatory Visit: Admitting: Cardiology

## 2023-08-07 ENCOUNTER — Ambulatory Visit: Attending: Cardiology | Admitting: Cardiology

## 2023-08-07 VITALS — BP 170/108 | HR 78 | Ht 64.0 in | Wt 161.8 lb

## 2023-08-07 DIAGNOSIS — I480 Paroxysmal atrial fibrillation: Secondary | ICD-10-CM

## 2023-08-07 DIAGNOSIS — I10 Essential (primary) hypertension: Secondary | ICD-10-CM

## 2023-08-07 DIAGNOSIS — E782 Mixed hyperlipidemia: Secondary | ICD-10-CM | POA: Diagnosis not present

## 2023-08-07 MED ORDER — CARVEDILOL 12.5 MG PO TABS: 12.5000 mg | ORAL_TABLET | Freq: Two times a day (BID) | ORAL | 3 refills | Status: AC

## 2023-08-07 NOTE — Progress Notes (Signed)
 Cardiology Office Note:    Date:  08/07/2023   ID:  JOHNNYE Pena, DOB 10-16-1940, MRN [id number]  PCP:  Jolanda Nation, NP   [facility] Providers Cardiologist:  None Electrophysiologist:  Will Cortland Ding, MD     Referring MD: Jolanda Nation, NP   Chief Complaint  Patient presents with   Follow-up    Needs appt with atrial fibrillation clinic. Was not able to see a fib clinic last time     History of Present Illness:    Courtney Pena is a 84 y.o. female with a hx of hypertension, hyperlipidemia, diabetes, A-fib/flutter on Eliquis  who presents for follow-up   Previously evaluated from a cardiac perspective at our [facility] practice.  Switching to [facility] for easier commute.She has an ILR in place which has shown low A-fib burden.  Last documented A-fib episode was December 2022.  Was scheduled to follow-up with A-fib clinic, has not been able to make appointment yet.  Compliant with Eliquis , diltiazem  as prescribed.  BP at home appears elevated with systolics in the 140s to 150s.  She is intolerant to several BP medications, has taken carvedilol  in the past with good effect.   Prior notes/testing Cardiac PET/2024 normal perfusion with no significant ischemia. Echo 12/2021 EF 65 to 70% Stopped flecainide  due to blurred vision.  Deferred ablation or propafenone. Previously did not tolerate Xarelto .  Past Medical History:  Diagnosis Date   Adenomatous polyp of colon 10/23/2021   Last colonoscopy was 02/2018 recommended 3 year follow up.       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  Did not tolerate zaralto for PAF.  Seems to tolerate ASA.[name]  Likely needs long term iron  to keep up with ongoing loss.     Arthritis    Atrial flutter (HCC) 12/07/2010   converted in ED with 300 mg  flecainide    CAD (coronary artery disease)    a. mild per cath in 2004;  b. nonischemic Myoview  in March 2012;  c. Lex MV 1/14:  EF 66%, no ischemia   Cataract    Cold intolerance of hand 12/18/2021   Degenerative arthritis of thumb 02/12/2014   Disequilibrium 04/25/2006   Formatting of this note might be different from the original. Formatting of this note might be different from the original. Qualifier: Diagnosis of By: Bronson Canny MD, Sammie Crigler Last Assessment & Plan: Formatting of this note might be different from the original. Now describing ear fullness more on left side.  Perhaps the removal of cerumen impaction will help.  No further WU given previously seen by ENT   Diverticulosis of colon 04/25/2006   Qualifier: Diagnosis of   By: Bronson Canny MD, Sammie Crigler      Replacing diagnoses that were inactivated after the 05/28/22 regulatory import     DJD, UNSPECIFIED 04/25/2006   Qualifier: Diagnosis of   By: Bronson Canny MD, Sammie Crigler      Replacing diagnoses that were inactivated after the 05/28/22 regulatory import     Dysrhythmia    A-Fib. 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     Hyperlipidemia    Hypertension    Hypothyroidism    Neuromuscular disorder (HCC)    neuropathy in feet   Obesity    Personal history of colonic polyps 06/06/2009   cecal polyp   Rheumatoid arthritis (HCC)    Shingles 2023   Sleep apnea    lost weight no longer needs   Type 2 diabetes mellitus with diabetic chronic kidney disease (HCC) 04/25/2006        Past Surgical History:  Procedure Laterality Date   ARTERY BIOPSY N/A 08/31/2021   Procedure: LEFT TEMPORAL ARTERY BIOPSY;   Surgeon: Oza Blumenthal, MD;  Location: [facility];  Service: General;  Laterality: N/A;   BREAST BIOPSY Left    BREAST EXCISIONAL BIOPSY Left    benign   CARDIAC CATHETERIZATION  1999&2004   CARPAL TUNNEL RELEASE Bilateral 2003   CATARACT EXTRACTION Bilateral 1990   CHOLECYSTECTOMY N/A 07/05/2021   Procedure: LAPAROSCOPIC CHOLECYSTECTOMY;  Surgeon: Junie Olds, MD;  Location: [facility];  Service: General;  Laterality: N/A;   COLONOSCOPY     ESOPHAGOGASTRODUODENOSCOPY  08/13/2011   Procedure: ESOPHAGOGASTRODUODENOSCOPY (EGD);  Surgeon: Kenney Peacemaker, MD;  Location: [facility] ENDOSCOPY;  Service: Endoscopy;  Laterality: N/A;   implantable loop recorder placement  07/20/2020   Medtronic Reveal Linq model LNQ 22 ([place] [id number]) implantable loop recorder by Dr Nunzio Belch    Current Medications: Current Meds  Medication Sig   allopurinol  (ZYLOPRIM ) 100 MG tablet Take 1 tablet (100 mg total) by mouth daily.   apixaban  (ELIQUIS ) 5 MG TABS tablet Take 5 mg by mouth 2 (two) times daily.   Apoaequorin (PREVAGEN PO) Take 1 tablet by mouth daily.   blood glucose meter kit and supplies Dispense based on patient and insurance preference. Use up to four times daily as directed. (FOR ICD-10 E10.9, E11.9).   Blood Glucose Monitoring Suppl (ONE TOUCH ULTRA 2) w/Device KIT 1 each by Does not apply route daily. Use glucometer to check blood sugar 3 times daily. E11.22   carvedilol  (COREG ) 12.5 MG tablet Take 1 tablet (12.5 mg total) by mouth 2 (two) times daily.   Coenzyme Q10 (COQ10) 100 MG CAPS Take 100 mg by mouth daily.   dapagliflozin  propanediol (FARXIGA ) 10 MG TABS tablet Take 10 mg by mouth daily. 1 tablet by mouth each morning (AZ&Me PAP)   diclofenac  Sodium (VOLTAREN ) 1 % GEL Apply 4 g topically 4 (four) times daily.   diltiazem  (CARDIZEM  CD) 360 MG 24 hr capsule Take 1 capsule (360 mg total) by mouth daily.   diltiazem  (CARDIZEM ) 30 MG tablet One tab by mouth every six hours as needed for atrial fib    glucose blood (ONETOUCH ULTRA) test strip 1 Container by Other route every morning.   glucose blood (ONETOUCH ULTRA) test strip Use to check blood sugar up to three times daily as instructed. E11.22   hydrOXYzine  (ATARAX ) 10 MG tablet Take 1 tablet (10 mg total) by mouth 2 (two) times daily as needed for itching.   Insulin  Pen Needle 31G X 8 MM MISC Use with pen to inject once daily.   Lancets (ONETOUCH ULTRASOFT) lancets Use to check blood sugar up to 3 times daily. E11.22   levothyroxine  (SYNTHROID ) 112 MCG tablet Take 1 tablet (112 mcg total) by mouth daily before breakfast.   meclizine  (ANTIVERT ) 25 MG tablet TAKE 1 TABLET (25  MG TOTAL) BY MOUTH AS NEEDED.   metFORMIN  (GLUCOPHAGE ) 500 MG tablet Take by mouth daily with breakfast.   Multiple Vitamin (MULTI-VITAMINS) TABS Take 1 tablet by mouth daily.   nitroGLYCERIN  (NITROSTAT ) 0.4 MG SL tablet Place 1 tablet (0.4 mg total) under the tongue every 5 (five) minutes as needed for chest pain (Call 911 if chest pain after three doses).   omeprazole  (PRILOSEC) 40 MG capsule Take 1 capsule (40 mg total) by mouth daily.   rosuvastatin  (CRESTOR ) 10 MG tablet Take 1 tablet (10 mg total) by mouth daily.   Semaglutide , 1 MG/DOSE, 4 MG/3ML SOPN Inject 1 mg as directed once a week.   trospium  (SANCTURA ) 20 MG tablet Take 1 tablet (20 mg total) by mouth 2 (two) times daily.   VITAMIN D , CHOLECALCIFEROL , PO Take by mouth. 4000mg      Allergies:   Actos [pioglitazone], Lisinopril, Nsaids, Penicillins, Sulfamethoxazole, Flecainide , Hctz [hydrochlorothiazide ], Iodinated contrast media, Prednisone , Statins, Gabapentin , Losartan  potassium, and Zetia  [ezetimibe ]   Social History   Socioeconomic History   Marital status: Widowed    Spouse name: Caretha Chapel   Number of children: 4   Years of education: some colle   Highest education level: Not on file  Occupational History   Occupation: Management    Employer: UNEMPLOYED  Tobacco Use   Smoking status: Former     Current packs/day: 0.00    Average packs/day: 2.0 packs/day for 30.0 years (60.0 ttl pk-yrs)    Types: Cigarettes    Start date: 02/27/1963    Quit date: 02/26/1993    Years since quitting: 30.4    Passive exposure: Past   Smokeless tobacco: Never   Tobacco comments:    Former smoker 03/21/22  Vaping Use   Vaping status: Never Used  Substance and Sexual Activity   Alcohol use: No    Alcohol/week: 0.0 standard drinks of alcohol   Drug use: No   Sexual activity: Not Currently  Other Topics Concern   Not on file  Social History Narrative   Not on file   Social Drivers of Health   Financial Resource Strain: Low Risk  (07/02/2023)   Overall Financial Resource Strain (CARDIA)    Difficulty of Paying Living Expenses: Not hard at all  Food Insecurity: No Food Insecurity (07/02/2023)   Hunger Vital Sign    Worried About Running Out of Food in the Last Year: Never true    Ran Out of Food in the Last Year: Never true  Transportation Needs: No Transportation Needs (07/02/2023)   PRAPARE - Administrator, Civil Service (Medical): No    Lack of Transportation (Non-Medical): No  Physical Activity: Sufficiently Active (07/02/2023)   Exercise Vital Sign    Days of Exercise per Week: 7 days    Minutes of Exercise per Session: 40 min  Stress: No Stress Concern Present (07/02/2023)   [employer] of Occupational Health - Occupational Stress Questionnaire    Feeling of Stress : Not at all  Social Connections: Socially Integrated (07/02/2023)   Social Connection and Isolation Panel [NHANES]    Frequency of Communication with Friends and Family: More than three times a week    Frequency of Social Gatherings with Friends and Family: More than three times a week    Attends Religious Services: More than 4 times per year    Active Member of [employer] or Organizations: Yes    Attends Banker Meetings: More than 4 times per year    Marital Status:  Married     Family History: The  patient's family history includes Colon polyps in her daughter; Diabetes in her daughter and maternal grandfather; Heart disease in her father; Hypertension in her mother; Other in her mother; Thyroid  disease in her daughter. There is no history of Colon cancer, Esophageal cancer, Stomach cancer, or Rectal cancer.  ROS:   Please see the history of present illness.     All other systems reviewed and are negative.  EKGs/Labs/Other Studies Reviewed:    The following studies were reviewed today:  EKG Interpretation Date/Time:  Wednesday August 07 2023 10:19:13 EDT Ventricular Rate:  78 PR Interval:  192 QRS Duration:  90 QT Interval:  404 QTC Calculation: 460 R Axis:   -15  Text Interpretation: Sinus rhythm with occasional Premature ventricular complexes Inferior infarct (cited on or before 21-Mar-2022) Confirmed by Constancia Delton ([id number]) on 08/07/2023 10:43:56 AM    Recent Labs: 10/23/2022: TSH 4.290 01/12/2023: Hemoglobin 9.5; Platelets 199 05/15/2023: ALT 8; BUN 32; Creatinine, Ser 1.37; Potassium 4.2; Sodium 136  Recent Lipid Panel    Component Value Date/Time   CHOL 109 02/22/2022 1102   TRIG 107 02/22/2022 1102   HDL 61 02/22/2022 1102   CHOLHDL 1.8 02/22/2022 1102   CHOLHDL 4.7 01/27/2015 1238   VLDL 28 01/27/2015 1238   LDLCALC 29 02/22/2022 1102   LDLDIRECT 62 11/24/2018 1211   LDLDIRECT 178 (H) 05/12/2015 1229     Risk Assessment/Calculations:            Physical Exam:    VS:  BP (!) 170/108 (BP Location: Left Arm, Patient Position: Sitting, Cuff Size: Normal)   Pulse 78   Ht 5' 4 (1.626 m)   Wt 161 lb 12.8 oz (73.4 kg)   SpO2 97%   BMI 27.77 kg/m     Wt Readings from Last 3 Encounters:  08/07/23 161 lb 12.8 oz (73.4 kg)  07/18/23 157 lb (71.2 kg)  07/15/23 155 lb (70.3 kg)     GEN:  Well nourished, well developed in no acute distress HEENT: Normal NECK: No JVD; No carotid bruits CARDIAC: RRR, no murmurs, rubs, gallops RESPIRATORY:  Clear to  auscultation without rales, wheezing or rhonchi  ABDOMEN: Soft, non-tender, non-distended MUSCULOSKELETAL:  No edema; No deformity  SKIN: Warm and dry NEUROLOGIC:  Alert and oriented x 3 PSYCHIATRIC:  Normal affect   ASSESSMENT:    1. Paroxysmal atrial fibrillation (HCC)   2. Primary hypertension   3. Mixed hyperlipidemia    PLAN:    In order of problems listed above:  Paroxysmal atrial fibrillation, currently in sinus rhythm.  BP elevated, start Coreg  12.5 mg twice daily.  Continue diltiazem  360 mg daily, Eliquis  5 mg twice daily.  Echo 5/25 EF 60 to 65%, severely dilated LA.  Establish care with EP/A-fib clinic.  Has ILR in place. Hypertension, blood pressure elevated.  Start Coreg  12.5 mg twice daily,  Continue diltiazem  360 mg daily.  Check BP at home and keep log.  Consider titrating Coreg  if BP not well-controlled.  Has several BP medication allergies. Hyperlipidemia, tolerating low-dose Crestor .  Cholesterol controlled.  Continue Crestor  10 mg daily.  Follow-up in 6 to 8 weeks for BP check.      Medication Adjustments/Labs and Tests Ordered: Current medicines are reviewed at length with the patient today.  Concerns regarding medicines are outlined above.  Orders Placed This Encounter  Procedures   Amb Referral to AFIB Clinic   EKG 12-Lead   Meds ordered this  encounter  Medications   carvedilol  (COREG ) 12.5 MG tablet    Sig: Take 1 tablet (12.5 mg total) by mouth 2 (two) times daily.    Dispense:  180 tablet    Refill:  3    Patient Instructions  Medication Instructions:  Your physician recommends the following medication changes.  START TAKING: Carvedilol  12.5 mg twice a daily   *If you need a refill on your cardiac medications before your next appointment, please call your pharmacy*  Lab Work: No labs ordered today  If you have labs (blood work) drawn today and your tests are completely normal, you will receive your results only by: MyChart Message (if you  have MyChart) OR A paper copy in the mail If you have any lab test that is abnormal or we need to change your treatment, we will call you to review the results.  Testing/Procedures: No test ordered today   Follow-Up: At [facility], you and your health needs are our priority.  As part of our continuing mission to provide you with exceptional heart care, our providers are all part of one team.  This team includes your primary Cardiologist (physician) and Advanced Practice Providers or APPs (Physician Assistants and Nurse Practitioners) who all work together to provide you with the care you need, when you need it.  Your next appointment:   8 week(s)  Provider:   You may see Dr. Junnie Olives or one of the following Advanced Practice Providers on your designated Care Team:   Laneta Pintos, NP Gildardo Labrador, PA-C Varney Gentleman, PA-C Cadence [place], PA-C Ronald Cockayne, NP Morey Ar, NP    We recommend signing up for the patient portal called MyChart.  Sign up information is provided on this After Visit Summary.  MyChart is used to connect with patients for Virtual Visits (Telemedicine).  Patients are able to view lab/test results, encounter notes, upcoming appointments, etc.  Non-urgent messages can be sent to your provider as well.   To learn more about what you can do with MyChart, go to [email or web address].     Signed, Constancia Delton, MD  08/07/2023 11:35 AM    [facility]

## 2023-08-07 NOTE — Patient Instructions (Signed)
 Medication Instructions:  Your physician recommends the following medication changes.  START TAKING: Carvedilol  12.5 mg twice a daily   *If you need a refill on your cardiac medications before your next appointment, please call your pharmacy*  Lab Work: No labs ordered today  If you have labs (blood work) drawn today and your tests are completely normal, you will receive your results only by: MyChart Message (if you have MyChart) OR A paper copy in the mail If you have any lab test that is abnormal or we need to change your treatment, we will call you to review the results.  Testing/Procedures: No test ordered today   Follow-Up: At [facility], you and your health needs are our priority.  As part of our continuing mission to provide you with exceptional heart care, our providers are all part of one team.  This team includes your primary Cardiologist (physician) and Advanced Practice Providers or APPs (Physician Assistants and Nurse Practitioners) who all work together to provide you with the care you need, when you need it.  Your next appointment:   8 week(s)  Provider:   You may see Dr. Junnie Olives or one of the following Advanced Practice Providers on your designated Care Team:   Laneta Pintos, NP Gildardo Labrador, PA-C Varney Gentleman, PA-C Cadence [place], PA-C Ronald Cockayne, NP Morey Ar, NP    We recommend signing up for the patient portal called MyChart.  Sign up information is provided on this After Visit Summary.  MyChart is used to connect with patients for Virtual Visits (Telemedicine).  Patients are able to view lab/test results, encounter notes, upcoming appointments, etc.  Non-urgent messages can be sent to your provider as well.   To learn more about what you can do with MyChart, go to [email or web address].

## 2023-08-08 ENCOUNTER — Ambulatory Visit (INDEPENDENT_AMBULATORY_CARE_PROVIDER_SITE_OTHER)

## 2023-08-08 ENCOUNTER — Encounter (HOSPITAL_COMMUNITY): Payer: Self-pay

## 2023-08-08 DIAGNOSIS — I480 Paroxysmal atrial fibrillation: Secondary | ICD-10-CM | POA: Diagnosis not present

## 2023-08-08 LAB — CUP PACEART REMOTE DEVICE CHECK
Date Time Interrogation Session: 20250611231251
Implantable Pulse Generator Implant Date: 20220525

## 2023-08-10 ENCOUNTER — Ambulatory Visit: Payer: Self-pay | Admitting: Cardiovascular Disease

## 2023-08-14 ENCOUNTER — Other Ambulatory Visit (INDEPENDENT_AMBULATORY_CARE_PROVIDER_SITE_OTHER): Payer: Self-pay | Admitting: Pharmacist

## 2023-08-14 DIAGNOSIS — I480 Paroxysmal atrial fibrillation: Secondary | ICD-10-CM

## 2023-08-14 NOTE — Progress Notes (Signed)
 Brief Telephone Documentation Reason for Call: Diabetes BG check-in  Summary of Call: Called patient 08/14/23 to f/u on blood glucose  Patient reports she has been doing well though has not been checking BG as frequently.  Is not able to recall specific numbers. Notes some days FBG as low as 115s-120s, though other times is higher. Somewhat difficult to discern glycemic control given lack of SMBG. Suspect need for further control.   Not on SGLT2i in the setting of urinary frequency.  Currently Taking metformin  500 mg daily and Ozempic  1.0 mg.   Medication options are very limited in this patient with CKD Ozempic  titration limited by preference to avoid weight loss Metformin  use limited by renal dysfunction SGLT2i: Avoiding at present time in the setting of urinary frequency. Defer to urology appropriateness of use.  TZD contraindicated in setting of CHF diagnosis DPP4i not appropriate while on GLP1RA.  Insulin  or SU not the most ideal in terms of hypoglycemia risk though may be needed to achieve adequate glycemic control. This would require increase in SMBG. Could consider sample CGM though would not be a long-term solution. With essentially no BG data today, plan for defer new medication start until adequate SMBG reviewed, or A1c worsening.   Continues to note urinary symptoms. Is confused about urology referral. States she has not been contacted for an appointment despite referral several months ago.  Per chart, she saw urology on 07/08/23.  Follow Up: Patient given direct line for further questions/concerns.  Daron Ellen, PharmD Clinical Pharmacist [facility] [phone]

## 2023-08-19 DIAGNOSIS — H43822 Vitreomacular adhesion, left eye: Secondary | ICD-10-CM | POA: Diagnosis not present

## 2023-08-19 DIAGNOSIS — H43813 Vitreous degeneration, bilateral: Secondary | ICD-10-CM | POA: Diagnosis not present

## 2023-08-19 DIAGNOSIS — E113213 Type 2 diabetes mellitus with mild nonproliferative diabetic retinopathy with macular edema, bilateral: Secondary | ICD-10-CM | POA: Diagnosis not present

## 2023-08-19 DIAGNOSIS — H34831 Tributary (branch) retinal vein occlusion, right eye, with macular edema: Secondary | ICD-10-CM | POA: Diagnosis not present

## 2023-08-27 NOTE — Progress Notes (Signed)
 [facility] Summary Report / Loop Recorder

## 2023-08-27 NOTE — Addendum Note (Signed)
 Addended by: TAWNI DRILLING D on: 08/27/2023 12:37 PM   Modules accepted: Orders

## 2023-09-02 ENCOUNTER — Telehealth: Payer: Self-pay

## 2023-09-02 NOTE — Telephone Encounter (Signed)
 Copied from CRM [id number]. Topic: Clinical - Prescription Issue >> Sep 02, 2023  4:12 PM Chasity T wrote: Reason for CRM: Patient is calling to speak with pharmacist Manuelita Kobs. She wants a call back at [phone]

## 2023-09-04 ENCOUNTER — Other Ambulatory Visit: Payer: Self-pay | Admitting: Pharmacist

## 2023-09-04 NOTE — Progress Notes (Signed)
 Brief Telephone Documentation Reason for Call: Patient called stating she needs a new prescription for Eliquis   Summary of Call: Patient requesting new Rx for Eliquis .   States she has a full bottle of Eliquis  though checked the bottle and states it is out of date.  Current bottle has a fill date of 04/14/2020. Expiration date of 02/28/2023. Historically prescribed by cardiology.   Patient confirms she continues to follow with cardiology. Last cardiology visit encounter in chart 08/07/23 with Dr. Darliss.  Follow Up: Unable to locate cardiology provider pool -- Message send directly prescriber (cardiology Madalyn Silvan, RN) Advised patient to contact cardiology office as well to inquire of samples or rx refill if she has not heard from pharmacy quickly  Patient given PharmD direct line for further questions/concerns  Courtney Pena, PharmD Clinical Pharmacist [facility] [phone]

## 2023-09-09 ENCOUNTER — Other Ambulatory Visit: Payer: Self-pay

## 2023-09-09 ENCOUNTER — Ambulatory Visit (INDEPENDENT_AMBULATORY_CARE_PROVIDER_SITE_OTHER)

## 2023-09-09 ENCOUNTER — Telehealth: Payer: Self-pay

## 2023-09-09 DIAGNOSIS — I480 Paroxysmal atrial fibrillation: Secondary | ICD-10-CM | POA: Diagnosis not present

## 2023-09-09 LAB — CUP PACEART REMOTE DEVICE CHECK
Date Time Interrogation Session: 20250713233100
Implantable Pulse Generator Implant Date: 20220525

## 2023-09-09 MED ORDER — APIXABAN 5 MG PO TABS: 5.0000 mg | ORAL_TABLET | Freq: Two times a day (BID) | ORAL | 3 refills | Status: DC

## 2023-09-09 NOTE — Telephone Encounter (Signed)
-----   Message from Manuelita JONELLE Kobs sent at 09/04/2023  2:28 PM EDT ----- Patient called primary care for Eliquis  refill (prescribed by cards). Ran out of Eliquis  this week.  Notes she may have told you she did not need a refill @June  appointment though realized her unopened bottle at home is from 2022/Expired. Notes she has contacted her pharmacy numerous times though has not made progress with getting refill.   Thank you!

## 2023-09-09 NOTE — Telephone Encounter (Signed)
 Called patient to inquire about eliquis  prescription. Noted that it said eliquis  ran out last week so followed up with patient on this., she stated that she has about 2 days left so she has not completely ran out and stopped taking this. Just wanted to check with her on that to make sure she was taking it. I told her that the medication had been refilled for [facility] through [facility] and she stated some other medications needed a refill and stated the name and those were non-cardiac and I told her she would just have to get these filled through her PCP. Pt verbalized understanding of information and had no other questions at this time.

## 2023-09-10 NOTE — Telephone Encounter (Signed)
 Copied from CRM [id number]. Topic: Clinical - Medication Question >> Sep 10, 2023  8:47 AM Aleatha BROCKS wrote: Reason for CRM: Patient went to go get her Eliquis , and the pharmacy is saying its cost 500 now that is the [facility] [phone]

## 2023-09-10 NOTE — Telephone Encounter (Signed)
 Can patient get help with her eliquis ?

## 2023-09-11 ENCOUNTER — Other Ambulatory Visit: Payer: Self-pay | Admitting: Pharmacist

## 2023-09-11 ENCOUNTER — Other Ambulatory Visit (HOSPITAL_COMMUNITY): Payer: Self-pay

## 2023-09-11 ENCOUNTER — Telehealth: Payer: Self-pay

## 2023-09-11 MED ORDER — APIXABAN 5 MG PO TABS: 5.0000 mg | ORAL_TABLET | Freq: Two times a day (BID) | ORAL | Status: DC

## 2023-09-11 NOTE — Telephone Encounter (Signed)
 She can call the plan and ask them for a payment plan and they will break the deductible up

## 2023-09-11 NOTE — Telephone Encounter (Signed)
 Hi Summer! I attempted to test claim Eliquis  5 mg, but it shows refill too soon due to pharmacy recently billing claim so unable to see cost details of current fill. However, I test billed Eliquis  2.5 mg since it should be the same tier on patient's plan and copay is $47. So if the patient picks up the current fill of Eliquis  5 mg, they will meet their deductible and copay will be $47 going forward.   Patient has a Medicare plan so they don't qualify to use a copay card. They will have to spend at least 3% of their annual income towards medication copay's in order to qualify for PAP with BMS. Advise patient call the customer service number on their insurance card and request a medicare payment plan if they cannot afford deductible.

## 2023-09-11 NOTE — Addendum Note (Signed)
 Addended by: BRIEN SALM on: 09/11/2023 01:41 PM   Modules accepted: Orders

## 2023-09-11 NOTE — Telephone Encounter (Signed)
 Pharmacy Patient Advocate Encounter  Insurance verification completed.   The patient is insured through [employer] test claim for ELIQUIS . Currently a quantity of 60 is a 30 day supply and the co-pay is NA .  ELIQUIS  5 MG SHOWS REFILL TOO SOON, PHARMACY RECENTLY BILLED CLAIM, UNABLE TO SEE COST. TEST BILLED ELIQUIS  2.5 MG SINCE IT SHOULD BE THE SAME TIER ON PLAN TO GET AN IDEA OF COST, COPAY IS $47. SO IF PATIENT PICKS UP THE CURRENT FILL OF ELIQUIS  5 MG, THEY WILL MEET THEIR DEDUCTIBLE.   PT HAS A MEDICARE PLAN SO DOESN'T QUALIFY FOR COPAY CARD. WILL HAVE TO SPEND 3% OF INCOME ON MEDS THIS YEAR IN ORDER TO QUALIFY FOR PAP WITH BMS. ADVISE PATIENT CALL THEIR PLAN AND REQUEST A MEDICARE PAYMENT PLAN IF THEY CANNOT AFFORD DEDUCTIBLE.   This test claim was processed through [facility]- copay amounts may vary at other pharmacies due to pharmacy/plan contracts, or as the patient moves through the different stages of their insurance plan.

## 2023-09-11 NOTE — Progress Notes (Signed)
 Brief Telephone Documentation Reason for Call: Incoming call E2C2  Summary of Call: Patient notes she ran out of Eliquis . Last dose yesterday, missed AM dose.  Notes she was getting through PAP in 2024 though 3% spend requirement is too high for eligibility this year.   Believes she only paid ~$30-50 for her refill in March at [facility] though [facility] refill was estimated at >$500 which would not suggest deductible issue.  In [facility] portal, there is no record of Eliquis  being filled in 2025. More concerning for possible high cost due to deductible.   Prior GIB on Xarelto  unfortunately so may not be best alternative.    Prescription Benefits: Payor: UHC Medicare Advantage [place]-0016 (PPO) Summary of Benefit: [email or web address] Annual prescription deductible $0 for Tiers 1-2 $420 for Tiers 3-5 Copay (after deductible) Preferred Brand: $47/month   Follow Up: Patient given direct line for further questions/concerns. Msgd cardiology RN to inquire if their practice accepts samples  Courtney Pena, PharmD Clinical Pharmacist [facility] [phone]

## 2023-09-12 ENCOUNTER — Other Ambulatory Visit: Payer: Self-pay | Admitting: Pharmacist

## 2023-09-12 ENCOUNTER — Telehealth: Payer: Self-pay

## 2023-09-12 NOTE — Telephone Encounter (Signed)
 Patient called regarding assistance for Eliquis .   Patient informed me she has moved to a new PCP office at [facility] but wanted to reach out to me about the Eliquis  help (since I've helped her in the past).   Informed patient of the 3% of income rule that [employer] PAP requires. Patient say she makes about $4100 monthly ($49200 annually), which would require around ~$1400 be spent on meds for calendar year. Patient doesn't think she has spent this yet.   Gave patient LIS/Extra Help phone number to apply. Pt aware that if she's approved this will start immediately and apply towards all her meds. If denied, she will reach back out.  Lastly told patient to reach out to her insurance about a payment plan if the LIS is denied. This could possibly help her copay in the meantime.

## 2023-09-12 NOTE — Progress Notes (Signed)
 Brief Telephone Documentation Reason for Call: Medication Access  Summary of Call: Patient confirms picking up sample of Eliquis  from cardiology office yesterday.   We reviewed Medicare LIS requirements including annual income <$23,000. Patient verbalizes understanding that her income exceeds this.   We also discussed Medicare Payment plan in detail as this is usually not a feasible for patients with fixed income.  Medicare payment plan divides drug cost over the remaining months in the year.   Patient's deductible: $420 Copay thereafter $47 / month   Payment would increase (compounded) each month making drug copay more and more expensive each month through the end of the year.   General Example, not exact: If pharmacy charging her $500, then first bill = $500/5 months Aug =$100  Sept, Next month would be responsible for remaining cost ($400) + copay ($47) / 4 months = $447/4 = $112 Oct, remaining cost $335 + $47 / 3 = $127  Nov, remaining cost $255 + $47 /2 = $151 Dec remaining cost $ 151 + $47 = $198   PLAN: Responded to cardiology inbasket message informing them that patient cannot afford the Medicare payment plan.  Alternative medication may be needed.   Courtney Pena, PharmD Clinical Pharmacist [facility] [phone]

## 2023-09-16 NOTE — Telephone Encounter (Signed)
 Patient has taken warfarin in the past and said this medication did not work out for her, she also had the GI bleed on xarelto , not sure what other options there are for her.. please advise. She will not be able to afford eliquis .

## 2023-09-20 ENCOUNTER — Other Ambulatory Visit: Payer: Self-pay | Admitting: General Practice

## 2023-09-20 ENCOUNTER — Other Ambulatory Visit (HOSPITAL_COMMUNITY): Payer: Self-pay

## 2023-09-20 DIAGNOSIS — Z1231 Encounter for screening mammogram for malignant neoplasm of breast: Secondary | ICD-10-CM

## 2023-09-20 NOTE — Telephone Encounter (Signed)
 Patient almost out of Eliquis  samples.   Still unsure what she needs to do about her medication. Patient aware she doesn't qualify for PAP yet, doesn't qualify for LIS, and cannot afford the medicare payment plan.   Doesn't want to use Warfarin again, says it caused Afib episodes.   Aware Xarelto  could be an option and would like to discuss.

## 2023-09-27 ENCOUNTER — Other Ambulatory Visit: Payer: Self-pay | Admitting: Pharmacist

## 2023-09-27 DIAGNOSIS — E1122 Type 2 diabetes mellitus with diabetic chronic kidney disease: Secondary | ICD-10-CM

## 2023-09-27 NOTE — Telephone Encounter (Signed)
 Copied from CRM [id number]. Topic: Clinical - Prescription Issue >> Sep 27, 2023 10:33 AM Lavanda D wrote: Reason for CRM: Patient is looking to speak with Manuelita concerning her issue with the medication eliquis  - she said she doesn't have the medication and she needs help with getting it. Manuelita told Ms. Horton to give her a call when she runs out of the medication. She said Manuelita is helping her to get this medication until she is able to afford it.

## 2023-09-27 NOTE — Progress Notes (Signed)
 Brief Telephone Documentation Reason for Call: Question about Eliquis   Summary of Call: Patient states she has three more doses of Eliquis , enough to get her through to Sunday.  Has not contacted cardiology regarding samples or next steps.   Reports she has been trying to get an appointment with AF clinic per cariology recommendation. Referral notes suggest multiple outreach attempts to patient though she denies receiving call.   Advised her we still do not accept samples, she can reach out to cardiology to see if there are any further sample options. Provided her with cardiology clinic number.  Darliss Rogue, MD [address], [place] [address] Phone: [phone]   Provided her with AF clinic number so she can call and schedule her AF clinic visit [address] Phone: [phone]  Discussed that cardiology sent her a mychart message regarding alternative option, warfarin if Eliquis  is too costly. Patient notes that warfarin did not good for me when I was on it. I was having a lot of episodes.  Reviewed in detail that Eliquis /warfarin are not affecting her AF or her heart rhythm, rather just reducing risk of stroke in the setting of possible clot formation in AF. Assured her if transitioned to warfarin, her carvedilol  and dilt would continue working as they are now to control her rate/rhythm.    Follow Up: Patient given direct line for further questions/concerns.  Manuelita FABIENE Kobs, PharmD Clinical Pharmacist [facility] [phone]

## 2023-10-02 NOTE — Progress Notes (Signed)
 [facility] Summary Report / Loop Recorder

## 2023-10-04 ENCOUNTER — Ambulatory Visit: Attending: Cardiology | Admitting: Cardiology

## 2023-10-04 VITALS — BP 170/90 | HR 72 | Ht 65.0 in | Wt 165.6 lb

## 2023-10-04 DIAGNOSIS — I10 Essential (primary) hypertension: Secondary | ICD-10-CM | POA: Diagnosis not present

## 2023-10-04 DIAGNOSIS — E782 Mixed hyperlipidemia: Secondary | ICD-10-CM | POA: Diagnosis not present

## 2023-10-04 DIAGNOSIS — I480 Paroxysmal atrial fibrillation: Secondary | ICD-10-CM | POA: Diagnosis not present

## 2023-10-04 MED ORDER — APIXABAN 5 MG PO TABS: 5.0000 mg | ORAL_TABLET | Freq: Two times a day (BID) | ORAL | Status: DC

## 2023-10-04 NOTE — Progress Notes (Signed)
 Cardiology Office Note:    Date:  10/04/2023   ID:  Courtney Pena, DOB June 15, 1940, MRN [id number]  PCP:  Vincente Shivers, NP    HeartCare Providers Cardiologist:  None Electrophysiologist:  Will Gladis Norton, MD     Referring MD: Vincente Shivers, NP   Chief Complaint  Patient presents with   PHQ-9 8 Week Follow-up    Patient out of eliquis . Too expensive even with the insurance coverage.     History of Present Illness:    Courtney Pena is a 83 y.o. female with a hx of hypertension, hyperlipidemia, diabetes, A-fib/flutter on Eliquis  who presents for follow-up   Patient states doing okay, cannot afford Eliquis  due to lack of insurance coverage.  Needs to need a copayment/deductible which she cannot afford at this point.  Carvedilol  started after last visit to help with BP control A-fib.  States blood pressure at home is usually in the 130s to 140s systolic.  BP elevated today as patient is anxious due to not able to afford medications.  Denies palpitations.  Previously did not tolerate Xarelto  for warfarin.  Prior notes/testing Cardiac PET/2024 normal perfusion with no significant ischemia. Echo 12/2021 EF 65 to 70% Stopped flecainide  due to blurred vision.  Deferred ablation or propafenone. Previously did not tolerate Xarelto .  Past Medical History:  Diagnosis Date   Adenomatous polyp of colon 10/23/2021   Last colonoscopy was 02/2018 recommended 3 year follow up.       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  Did not tolerate zaralto for PAF.  Seems to tolerate ASA.[name]  Likely needs long term iron  to keep up with ongoing loss.     Arthritis    Atrial flutter (HCC) 12/07/2010   converted in ED with 300 mg flecainide    CAD (coronary artery disease)    a. mild per cath in 2004;  b.  nonischemic Myoview  in March 2012;  c. Lex MV 1/14:  EF 66%, no ischemia   Cataract    Cold intolerance of hand 12/18/2021   Degenerative arthritis of thumb 02/12/2014   Disequilibrium 04/25/2006   Formatting of this note might be different from the original. Formatting of this note might be different from the original. Qualifier: Diagnosis of By: Scarlet MD, Elsie Last Assessment & Plan: Formatting of this note might be different from the original. Now describing ear fullness more on left side.  Perhaps the removal of cerumen impaction will help.  No further WU given previously seen by ENT   Diverticulosis of colon 04/25/2006   Qualifier: Diagnosis of   By: Scarlet MD, Elsie      Replacing diagnoses that were inactivated after the 05/28/22 regulatory import     DJD, UNSPECIFIED 04/25/2006   Qualifier: Diagnosis of   By: Scarlet MD, Elsie      Replacing diagnoses that were inactivated after the 05/28/22 regulatory import     Dysrhythmia    A-Fib. has a loop recorder   External hemorrhoids 06/07/2010   Family history of adverse reaction to anesthesia    Daughter has severe N&V   Gastric antral vascular ectasia    source for gi bleed in 07/2011 - Xarelto  stopped   GERD (gastroesophageal reflux disease)    Gout    History of cholecystectomy 11/09/2015   History of CVA (cerebrovascular  accident) 11/24/2018   Affecting right eye.     Hyperlipidemia    Hypertension    Hypothyroidism    Neuromuscular disorder (HCC)    neuropathy in feet   Obesity    Personal history of colonic polyps 06/06/2009   cecal polyp   Rheumatoid arthritis (HCC)    Shingles 2023   Sleep apnea    lost weight no longer needs   Type 2 diabetes mellitus with diabetic chronic kidney disease (HCC) 04/25/2006        Past Surgical History:  Procedure Laterality Date   ARTERY BIOPSY N/A 08/31/2021   Procedure: LEFT TEMPORAL ARTERY BIOPSY;  Surgeon: Vernetta Berg, MD;  Location: [facility];  Service: General;  Laterality:  N/A;   BREAST BIOPSY Left    BREAST EXCISIONAL BIOPSY Left    benign   CARDIAC CATHETERIZATION  1999&2004   CARPAL TUNNEL RELEASE Bilateral 2003   CATARACT EXTRACTION Bilateral 1990   CHOLECYSTECTOMY N/A 07/05/2021   Procedure: LAPAROSCOPIC CHOLECYSTECTOMY;  Surgeon: Lyndel Deward PARAS, MD;  Location: [facility];  Service: General;  Laterality: N/A;   COLONOSCOPY     ESOPHAGOGASTRODUODENOSCOPY  08/13/2011   Procedure: ESOPHAGOGASTRODUODENOSCOPY (EGD);  Surgeon: Lupita FORBES Commander, MD;  Location: [facility]COPY;  Service: Endoscopy;  Laterality: N/A;   implantable loop recorder placement  07/20/2020   Medtronic Reveal Linq model LNQ 22 ([place] [id number]) implantable loop recorder by Dr Kelsie    Current Medications: Current Meds  Medication Sig   allopurinol  (ZYLOPRIM ) 100 MG tablet Take 1 tablet (100 mg total) by mouth daily.   Apoaequorin (PREVAGEN PO) Take 1 tablet by mouth daily.   blood glucose meter kit and supplies Dispense based on patient and insurance preference. Use up to four times daily as directed. (FOR ICD-10 E10.9, E11.9).   Blood Glucose Monitoring Suppl (ONE TOUCH ULTRA 2) w/Device KIT 1 each by Does not apply route daily. Use glucometer to check blood sugar 3 times daily. E11.22   carvedilol  (COREG ) 12.5 MG tablet Take 1 tablet (12.5 mg total) by mouth 2 (two) times daily.   Coenzyme Q10 (COQ10) 100 MG CAPS Take 100 mg by mouth daily.   diclofenac  Sodium (VOLTAREN ) 1 % GEL Apply 4 g topically 4 (four) times daily.   diltiazem  (CARDIZEM  CD) 360 MG 24 hr capsule Take 1 capsule (360 mg total) by mouth daily.   diltiazem  (CARDIZEM ) 30 MG tablet One tab by mouth every six hours as needed for atrial fib   glucose blood (ONETOUCH ULTRA) test strip 1 Container by Other route every morning.   glucose blood (ONETOUCH ULTRA) test strip Use to check blood sugar up to three times daily as instructed. E11.22   hydrOXYzine  (ATARAX ) 10 MG tablet Take 1 tablet (10 mg total) by mouth 2 (two) times  daily as needed for itching.   Insulin  Pen Needle 31G X 8 MM MISC Use with pen to inject once daily.   Lancets (ONETOUCH ULTRASOFT) lancets Use to check blood sugar up to 3 times daily. E11.22   levothyroxine  (SYNTHROID ) 112 MCG tablet Take 1 tablet (112 mcg total) by mouth daily before breakfast.   meclizine  (ANTIVERT ) 25 MG tablet TAKE 1 TABLET (25 MG TOTAL) BY MOUTH AS NEEDED.   metFORMIN  (GLUCOPHAGE ) 500 MG tablet Take by mouth daily with breakfast.   Multiple Vitamin (MULTI-VITAMINS) TABS Take 1 tablet by mouth daily.   nitroGLYCERIN  (NITROSTAT ) 0.4 MG SL tablet Place 1 tablet (0.4 mg total) under the tongue every 5 (five) minutes as needed  for chest pain (Call 911 if chest pain after three doses).   omeprazole  (PRILOSEC) 40 MG capsule Take 1 capsule (40 mg total) by mouth daily.   rosuvastatin  (CRESTOR ) 10 MG tablet Take 1 tablet (10 mg total) by mouth daily.   Semaglutide , 1 MG/DOSE, 4 MG/3ML SOPN Inject 1 mg as directed once a week.   VITAMIN D , CHOLECALCIFEROL , PO Take by mouth. 4000mg    [DISCONTINUED] apixaban  (ELIQUIS ) 5 MG TABS tablet Take 1 tablet (5 mg total) by mouth 2 (two) times daily.   [DISCONTINUED] apixaban  (ELIQUIS ) 5 MG TABS tablet Take 1 tablet (5 mg total) by mouth 2 (two) times daily.     Allergies:   Actos [pioglitazone], Lisinopril, Nsaids, Penicillins, Sulfamethoxazole, Flecainide , Hctz [hydrochlorothiazide ], Iodinated contrast media, Prednisone , Statins, Gabapentin , Losartan  potassium, and Zetia  [place].Fries ]   Social History   Socioeconomic History   Marital status: Widowed    Spouse name: Zachary   Number of children: 4   Years of education: some colle   Highest education level: Not on file  Occupational History   Occupation: Management    Employer: UNEMPLOYED  Tobacco Use   Smoking status: Former    Current packs/day: 0.00    Average packs/day: 2.0 packs/day for 30.0 years (60.0 ttl pk-yrs)    Types: Cigarettes    Start date: 02/27/1963    Quit date:  02/26/1993    Years since quitting: 30.6    Passive exposure: Past   Smokeless tobacco: Never   Tobacco comments:    Former smoker 03/21/22  Vaping Use   Vaping status: Never Used  Substance and Sexual Activity   Alcohol use: No    Alcohol/week: 0.0 standard drinks of alcohol   Drug use: No   Sexual activity: Not Currently  Other Topics Concern   Not on file  Social History Narrative   Not on file   Social Drivers of Health   Financial Resource Strain: Low Risk  (07/02/2023)   Overall Financial Resource Strain (CARDIA)    Difficulty of Paying Living Expenses: Not hard at all  Food Insecurity: No Food Insecurity (07/02/2023)   Hunger Vital Sign    Worried About Running Out of Food in the Last Year: Never true    Ran Out of Food in the Last Year: Never true  Transportation Needs: No Transportation Needs (07/02/2023)   PRAPARE - Administrator, Civil Service (Medical): No    Lack of Transportation (Non-Medical): No  Physical Activity: Sufficiently Active (07/02/2023)   Exercise Vital Sign    Days of Exercise per Week: 7 days    Minutes of Exercise per Session: 40 min  Stress: No Stress Concern Present (07/02/2023)   [employer] of Occupational Health - Occupational Stress Questionnaire    Feeling of Stress : Not at all  Social Connections: Socially Integrated (07/02/2023)   Social Connection and Isolation Panel    Frequency of Communication with Friends and Family: More than three times a week    Frequency of Social Gatherings with Friends and Family: More than three times a week    Attends Religious Services: More than 4 times per year    Active Member of [employer] or Organizations: Yes    Attends Engineer, structural: More than 4 times per year    Marital Status: Married     Family History: The patient's family history includes Colon polyps in her daughter; Diabetes in her daughter and maternal grandfather; Heart disease in her father; Hypertension in her  mother; Other in her mother; Thyroid  disease in her daughter. There is no history of Colon cancer, Esophageal cancer, Stomach cancer, or Rectal cancer.  ROS:   Please see the history of present illness.     All other systems reviewed and are negative.  EKGs/Labs/Other Studies Reviewed:    The following studies were reviewed today:       Recent Labs: 10/23/2022: TSH 4.290 01/12/2023: Hemoglobin 9.5; Platelets 199 05/15/2023: ALT 8; BUN 32; Creatinine, Ser 1.37; Potassium 4.2; Sodium 136  Recent Lipid Panel    Component Value Date/Time   CHOL 109 02/22/2022 1102   TRIG 107 02/22/2022 1102   HDL 61 02/22/2022 1102   CHOLHDL 1.8 02/22/2022 1102   CHOLHDL 4.7 01/27/2015 1238   VLDL 28 01/27/2015 1238   LDLCALC 29 02/22/2022 1102   LDLDIRECT 62 11/24/2018 1211   LDLDIRECT 178 (H) 05/12/2015 1229     Risk Assessment/Calculations:            Physical Exam:    VS:  BP (S) (!) 170/90 (BP Location: Right Arm, Patient Position: Sitting, Cuff Size: Normal)   Pulse 72   Ht 5' 5 (1.651 m)   Wt 165 lb 9.6 oz (75.1 kg)   SpO2 98%   BMI 27.56 kg/m     Wt Readings from Last 3 Encounters:  10/04/23 165 lb 9.6 oz (75.1 kg)  08/07/23 161 lb 12.8 oz (73.4 kg)  07/18/23 157 lb (71.2 kg)     GEN:  Well nourished, well developed in no acute distress HEENT: Normal NECK: No JVD; No carotid bruits CARDIAC: RRR, no murmurs, rubs, gallops RESPIRATORY:  Clear to auscultation without rales, wheezing or rhonchi  ABDOMEN: Soft, non-tender, non-distended MUSCULOSKELETAL:  No edema; No deformity  SKIN: Warm and dry NEUROLOGIC:  Alert and oriented x 3 PSYCHIATRIC:  Normal affect   ASSESSMENT:    1. Paroxysmal atrial fibrillation (HCC)   2. Primary hypertension   3. Mixed hyperlipidemia    PLAN:    In order of problems listed above:  Paroxysmal atrial fibrillation, has ILR in place.  Currently in sinus rhythm.  BP elevated, better controlled at home.  Continue Coreg  12.5 mg twice  daily.  Continue diltiazem  360 mg daily, Eliquis  5 mg twice daily.  Samples of Eliquis  were given.  Office in contact with patient assistance to see if Eliquis  can be covered.  Echo 5/25 EF 60 to 65%, severely dilated LA.  Hypertension, blood pressure elevated.  Better controlled at home.  Anxious continue Coreg  12.5 mg twice daily, diltiazem  360 mg daily. Has several BP medication allergies. Hyperlipidemia, Cholesterol controlled.  Continue Crestor  10 mg daily.  Follow-up in 3-6 months      Medication Adjustments/Labs and Tests Ordered: Current medicines are reviewed at length with the patient today.  Concerns regarding medicines are outlined above.  No orders of the defined types were placed in this encounter.  Meds ordered this encounter  Medications   apixaban  (ELIQUIS ) 5 MG TABS tablet    Sig: Take 1 tablet (5 mg total) by mouth 2 (two) times daily.    Dispense:  28 tablet    Lot Number?:   [id number]    Expiration Date?:   05/26/2024    Quantity:   28    Patient Instructions  Medication Instructions:  Your physician recommends that you continue on your current medications as directed. Please refer to the Current Medication list given to you today.   *If you need a refill on  your cardiac medications before your next appointment, please call your pharmacy*  Lab Work: No labs ordered today  If you have labs (blood work) drawn today and your tests are completely normal, you will receive your results only by: MyChart Message (if you have MyChart) OR A paper copy in the mail If you have any lab test that is abnormal or we need to change your treatment, we will call you to review the results.  Testing/Procedures: No test ordered today   Follow-Up: At [facility], you and your health needs are our priority.  As part of our continuing mission to provide you with exceptional heart care, our providers are all part of one team.  This team includes your primary Cardiologist  (physician) and Advanced Practice Providers or APPs (Physician Assistants and Nurse Practitioners) who all work together to provide you with the care you need, when you need it.  Your next appointment:   3 month(s)  Provider:   You may see Dr. Darliss or one of the following Advanced Practice Providers on your designated Care Team:   Lonni Meager, NP Lesley Maffucci, PA-C Bernardino Bring, PA-C Cadence Barceloneta, PA-C Tylene Lunch, NP Barnie Hila, NP    We recommend signing up for the patient portal called MyChart.  Sign up information is provided on this After Visit Summary.  MyChart is used to connect with patients for Virtual Visits (Telemedicine).  Patients are able to view lab/test results, encounter notes, upcoming appointments, etc.  Non-urgent messages can be sent to your provider as well.   To learn more about what you can do with MyChart, go to [email or web address].         Signed, Redell Darliss, MD  10/04/2023 12:03 PM    [facility]

## 2023-10-04 NOTE — Patient Instructions (Signed)
 Medication Instructions:  Your physician recommends that you continue on your current medications as directed. Please refer to the Current Medication list given to you today.   *If you need a refill on your cardiac medications before your next appointment, please call your pharmacy*  Lab Work: No labs ordered today  If you have labs (blood work) drawn today and your tests are completely normal, you will receive your results only by: MyChart Message (if you have MyChart) OR A paper copy in the mail If you have any lab test that is abnormal or we need to change your treatment, we will call you to review the results.  Testing/Procedures: No test ordered today   Follow-Up: At [facility], you and your health needs are our priority.  As part of our continuing mission to provide you with exceptional heart care, our providers are all part of one team.  This team includes your primary Cardiologist (physician) and Advanced Practice Providers or APPs (Physician Assistants and Nurse Practitioners) who all work together to provide you with the care you need, when you need it.  Your next appointment:   3 month(s)  Provider:   You may see Dr. Darliss or one of the following Advanced Practice Providers on your designated Care Team:   Lonni Meager, NP Lesley Maffucci, PA-C Bernardino Bring, PA-C Cadence Dorothy, PA-C Tylene Lunch, NP Barnie Hila, NP    We recommend signing up for the patient portal called MyChart.  Sign up information is provided on this After Visit Summary.  MyChart is used to connect with patients for Virtual Visits (Telemedicine).  Patients are able to view lab/test results, encounter notes, upcoming appointments, etc.  Non-urgent messages can be sent to your provider as well.   To learn more about what you can do with MyChart, go to [email or web address].

## 2023-10-07 ENCOUNTER — Ambulatory Visit
Admission: RE | Admit: 2023-10-07 | Discharge: 2023-10-07 | Disposition: A | Discharge status: Home / Self Care | Source: Ambulatory Visit | Attending: General Practice | Admitting: General Practice

## 2023-10-07 DIAGNOSIS — Z1231 Encounter for screening mammogram for malignant neoplasm of breast: Secondary | ICD-10-CM

## 2023-10-09 ENCOUNTER — Ambulatory Visit: Payer: Self-pay | Admitting: General Practice

## 2023-10-10 ENCOUNTER — Encounter

## 2023-10-14 ENCOUNTER — Telehealth: Payer: Self-pay | Admitting: Cardiology

## 2023-10-14 NOTE — Telephone Encounter (Signed)
   Pre-operative Risk Assessment    Patient Name: Courtney Pena  DOB: November 27, 1940 MRN: [id number]   Date of last office visit: unknown Date of next office visit: 01/06/2024  Request for Surgical Clearance    Procedure:  excisional biopsies of the maxilla gingiva  Date of Surgery:  Clearance TBD                                Surgeon:  TBD Surgeon's Group or Practice Name:  [facility] Phone number:  [phone] Fax number:  [phone]   Type of Clearance Requested:   - Pharmacy:  Hold Apixaban  (Eliquis ) instuction history of A fib   Type of Anesthesia:  Local    Additional requests/questions:    [place]acar[place], Evelyn   10/14/2023, 12:14 PM

## 2023-10-15 ENCOUNTER — Ambulatory Visit (INDEPENDENT_AMBULATORY_CARE_PROVIDER_SITE_OTHER): Admitting: General Practice

## 2023-10-15 ENCOUNTER — Other Ambulatory Visit: Payer: Self-pay | Admitting: Pharmacist

## 2023-10-15 ENCOUNTER — Encounter: Payer: Self-pay | Admitting: General Practice

## 2023-10-15 ENCOUNTER — Ambulatory Visit: Payer: Self-pay | Admitting: General Practice

## 2023-10-15 ENCOUNTER — Encounter: Payer: Self-pay | Admitting: Pharmacist

## 2023-10-15 VITALS — BP 128/60 | HR 65 | Temp 97.6°F | Ht 65.0 in | Wt 164.4 lb

## 2023-10-15 DIAGNOSIS — E039 Hypothyroidism, unspecified: Secondary | ICD-10-CM

## 2023-10-15 DIAGNOSIS — M21622 Bunionette of left foot: Secondary | ICD-10-CM

## 2023-10-15 DIAGNOSIS — R350 Frequency of micturition: Secondary | ICD-10-CM | POA: Diagnosis not present

## 2023-10-15 DIAGNOSIS — I10 Essential (primary) hypertension: Secondary | ICD-10-CM

## 2023-10-15 DIAGNOSIS — E1122 Type 2 diabetes mellitus with diabetic chronic kidney disease: Secondary | ICD-10-CM

## 2023-10-15 DIAGNOSIS — I480 Paroxysmal atrial fibrillation: Secondary | ICD-10-CM

## 2023-10-15 DIAGNOSIS — I130 Hypertensive heart and chronic kidney disease with heart failure and stage 1 through stage 4 chronic kidney disease, or unspecified chronic kidney disease: Secondary | ICD-10-CM | POA: Diagnosis not present

## 2023-10-15 DIAGNOSIS — Z7984 Long term (current) use of oral hypoglycemic drugs: Secondary | ICD-10-CM | POA: Diagnosis not present

## 2023-10-15 DIAGNOSIS — E1169 Type 2 diabetes mellitus with other specified complication: Secondary | ICD-10-CM | POA: Diagnosis not present

## 2023-10-15 DIAGNOSIS — I5032 Chronic diastolic (congestive) heart failure: Secondary | ICD-10-CM

## 2023-10-15 DIAGNOSIS — E785 Hyperlipidemia, unspecified: Secondary | ICD-10-CM

## 2023-10-15 DIAGNOSIS — Z7985 Long-term (current) use of injectable non-insulin antidiabetic drugs: Secondary | ICD-10-CM | POA: Diagnosis not present

## 2023-10-15 LAB — LIPID PANEL
Cholesterol: 165 mg/dL (ref 0–200)
HDL: 85.6 mg/dL (ref >39.00)
LDL Cholesterol: 70 mg/dL (ref 0–99)
NonHDL: 79.32
Total CHOL/HDL Ratio: 2
Triglycerides: 46 mg/dL (ref 0.0–149.0)
VLDL: 9.2 mg/dL (ref 0.0–40.0)

## 2023-10-15 LAB — COMPREHENSIVE METABOLIC PANEL WITH GFR
ALT: 11 U/L (ref 0–35)
AST: 13 U/L (ref 0–37)
Albumin: 4.1 g/dL (ref 3.5–5.2)
Alkaline Phosphatase: 62 U/L (ref 39–117)
BUN: 27 mg/dL — ABNORMAL HIGH (ref 6–23)
CO2: 27 meq/L (ref 19–32)
Calcium: 9.4 mg/dL (ref 8.4–10.5)
Chloride: 105 meq/L (ref 96–112)
Creatinine, Ser: 1.32 mg/dL — ABNORMAL HIGH (ref 0.40–1.20)
GFR: 37.34 mL/min — ABNORMAL LOW (ref >60.00)
Glucose, Bld: 146 mg/dL — ABNORMAL HIGH (ref 70–99)
Potassium: 4.2 meq/L (ref 3.5–5.1)
Sodium: 138 meq/L (ref 135–145)
Total Bilirubin: 0.3 mg/dL (ref 0.2–1.2)
Total Protein: 6.7 g/dL (ref 6.0–8.3)

## 2023-10-15 LAB — HEMOGLOBIN A1C: Hgb A1c MFr Bld: 7.4 % — ABNORMAL HIGH (ref 4.6–6.5)

## 2023-10-15 LAB — TSH: TSH: 6.2 u[IU]/mL — ABNORMAL HIGH (ref 0.35–5.50)

## 2023-10-15 MED ORDER — LEVOTHYROXINE SODIUM 112 MCG PO TABS: 112.0000 ug | ORAL_TABLET | Freq: Every day | ORAL | 3 refills | Status: DC

## 2023-10-15 MED ORDER — DILTIAZEM HCL 30 MG PO TABS: ORAL_TABLET | ORAL | 2 refills | Status: AC

## 2023-10-15 MED ORDER — LEVOTHYROXINE SODIUM 125 MCG PO TABS
125.0000 ug | ORAL_TABLET | Freq: Every day | ORAL | 1 refills | Status: AC
Start: 2023-10-15 — End: ?

## 2023-10-15 NOTE — Progress Notes (Signed)
 Brief Telephone Documentation Reason: Patient requests Novo PAP medication refill  Summary: Last shipment picked up from office 07/16/23.  Should be out of medication at the end of September.   Re-order form uploaded to PCP eFax folder for review/signature.  Once signed, CMA may fax directly to Novo Nordisk and scan to chart.   Follow Up: Patient given direct line for further questions/concerns.  Courtney Pena, PharmD Clinical Pharmacist [facility] [phone]

## 2023-10-15 NOTE — Progress Notes (Signed)
 Which medication is this for? None are mentioned in this note nor checked off on the form that was sent to the s-drive for [place] to sign off on?

## 2023-10-15 NOTE — Progress Notes (Signed)
 Brief Telephone Documentation Reason for Call: Patient visit with PCP today. Continues to report concern w Eliquis  access  Summary of Call: Patient notes that she knows she was denied for Eliquis  PAP but would like to try applying again to see if she can get them to waive the spending requirement. She believes they had waived it for her in previous years.  Chart notes from PAP team historically do note OOP spending requirements though does not hurt to reapply. Patient aware she will be denied initially. It is her preference to then call and speak w the program.   Application uploaded to front office eFax folder.  Patient preference to stop by clinic front desk to sign and provide income information.   Once signed, to be placed in [facility] folder for completion of provider page  Follow Up: Patient given direct line for further questions/concerns.  Manuelita FABIENE Kobs, PharmD Clinical Pharmacist [facility] [phone]

## 2023-10-15 NOTE — Patient Instructions (Addendum)
 Stop by the lab prior to leaving today. I will notify you of your results once received.   Please call urologist - [phone]  and ask about the CT scan and if you should continue Trospium  Chloride.   Manuelita will call you regarding your ozempic .   Continue medications as prescribed. I will be in touch if I need to make any changes after blood work.   You will either be contacted via phone regarding your referral to podiatry, or you may receive a letter on your MyChart portal from our referral team with instructions for scheduling an appointment. Please let us  know if you have not been contacted by anyone within two weeks.  Follow up in 3 months.   It was a pleasure to see you today!

## 2023-10-15 NOTE — Progress Notes (Signed)
 Established Patient Office Visit  Subjective   Patient ID: Courtney Pena, female    DOB: [birth year]  Age: 83 y.o. MRN: [id number]  Chief Complaint  Patient presents with   chronic care management    Patient here today for follow up on chronic conditions. Patient states she has not checked her BS in 2 weeks but when she was they were either normal or very high.    HPI  Discussed the use of AI scribe software for clinical note transcription with the patient, who gave verbal consent to proceed.  History of Present Illness Courtney Pena is an 83 year old female with past medical history of type 2 diabetes, hypertension, hypothyroidism, RA, hyperlipidemia, retinopathy presents today for a routine follow-up visit.  She manages her type 2 diabetes with metformin  500 mg once daily and Ozempic  1 mg weekly. She has resolved previous insurance issues with Ozempic  and reports no current problems. Her last A1c was checked in March 2025.  She is not checking her blood sugars at home.  She is taking Crestor  for CVD protection.  She is up-to-date on her pneumonia vaccine, foot exam, urine ACR.  She is experiencing difficulties with her atrial fibrillation medication, Eliquis , due to insurance coverage issues, resulting in significant out-of-pocket expenses. She previously used warfarin but found it unsuitable due to frequent episodes. She is concerned about the cost of Eliquis  and is exploring options for assistance. She follows with a cardiologist and an AFib clinic for her atrial fibrillation management.  She is on levothyroxine  112 mcg for hypothyroidism and requires a new prescription as she is running low. Her thyroid  levels were last checked in 2024.  Hypertension: she is also on diltiazem  30 mg as needed, and carvedilol  12.5 mg twice daily for blood pressure management.   Hyperlipidemia: She has a history of hyperlipidemia and is on Crestor  10 mg, which she tolerates well.    New growth on left foot: She mentions a bunion on her foot, which is causing pain and pressure. 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  Urine frequency: She has been experiencing urinary symptoms and was seen by a urologist in May 2025. She was prescribed trospium  chloride 20 mg twice daily, but she did not take it. A CT scan was ordered but not completed. She reports ongoing urinary frequency.  Diabetic retinopathy: She has a history of retinal issues and receives injections for her vision, which have significantly improved her symptoms. She is concerned about the cost of these injections.   Patient Active Problem List   Diagnosis Date Noted   Bunionette of left foot 10/15/2023   Pruritus 07/15/2023   Branch retinal vein occlusion of right eye with macular edema 05/15/2023   Itchy eyes 01/02/2023   History of gout 08/28/2022   Orthostatic hypotension 08/02/2022   Dizziness 07/02/2022   Subacute cough 07/02/2022   Right foot drop 05/07/2022   Tenosynovitis of left foot 10/19/2020   Hypercoagulable state due to paroxysmal atrial fibrillation (HCC) 10/03/2020   Rheumatoid arthritis (HCC) 03/21/2020   Tinnitus 03/21/2020   Idiopathic chronic gout of multiple sites without tophus 12/09/2019   Lumbar back pain with radiculopathy affecting left lower extremity 07/30/2019   Rotator cuff syndrome of right shoulder 11/24/2018   Hypertensive retinopathy of both eyes 10/14/2018   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 10/14/2018   Vitreomacular adhesion of left eye 10/14/2018  Diabetic retinopathy, nonproliferative, moderate (HCC) 03/07/2018   Chronic diastolic CHF (congestive heart failure) (HCC) 08/16/2017   Sudden hearing loss, left 09/25/2016   Chronic anticoag - Xarelto  05//2017, CHADS2CVASC=5 Eliquis  9/20 07/07/2015   Vitamin D  deficiency 09/18/2013   Hyperlipidemia associated with type 2 diabetes  mellitus (HCC) 08/22/2012   Pseudophakia, both eyes 03/12/2012   Presence of intraocular lens 03/12/2012   Pulmonary nodule 12/26/2010   Paroxysmal atrial fibrillation (HCC) 12/14/2010   Urinary frequency 07/13/2010   Hypothyroidism 04/25/2006   Type 2 diabetes mellitus with diabetic chronic kidney disease (HCC) 04/25/2006   HYPERCHOLESTEROLEMIA 04/25/2006   HYPERTENSION, BENIGN SYSTEMIC 04/25/2006   Coronary atherosclerosis 04/25/2006   Reflux esophagitis 04/25/2006   VERTIGO NOS OR DIZZINESS 04/25/2006   Past Medical History:  Diagnosis Date   Adenomatous polyp of colon 10/23/2021   Last colonoscopy was 02/2018 recommended 3 year follow up.       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  Did not tolerate zaralto for PAF.  Seems to tolerate ASA.[name]  Likely needs long term iron  to keep up with ongoing loss.     Arthritis    Atrial flutter (HCC) 12/07/2010   converted in ED with 300 mg flecainide    CAD (coronary artery disease)    a. mild per cath in 2004;  b. nonischemic Myoview  in March 2012;  c. Lex MV 1/14:  EF 66%, no ischemia   Cataract    Cold intolerance of hand 12/18/2021   Degenerative arthritis of thumb 02/12/2014   Disequilibrium 04/25/2006   Formatting of this note might be different from the original. Formatting of this note might be different from the original. Qualifier: Diagnosis of By: Scarlet MD, Elsie Last Assessment & Plan: Formatting of this note might be different from the original. Now describing ear fullness more on left side.  Perhaps the removal of cerumen impaction will help.  No further WU given previously seen by ENT   Diverticulosis of colon 04/25/2006   Qualifier: Diagnosis of   By: Scarlet MD, Elsie      Replacing diagnoses that were inactivated after the 05/28/22 regulatory import     DJD,  UNSPECIFIED 04/25/2006   Qualifier: Diagnosis of   By: Scarlet MD, Elsie      Replacing diagnoses that were inactivated after the 05/28/22 regulatory import     Dysrhythmia    A-Fib. 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     Hyperlipidemia    Hypertension    Hypothyroidism    Neuromuscular disorder (HCC)    neuropathy in feet   Obesity    Personal history of colonic polyps 06/06/2009   cecal polyp   Rheumatoid arthritis (HCC)    Shingles 2023   Sleep apnea    lost weight no longer needs   Type 2 diabetes mellitus with diabetic chronic kidney disease (HCC) 04/25/2006       Past Surgical History:  Procedure Laterality Date  ARTERY BIOPSY N/A 08/31/2021   Procedure: LEFT TEMPORAL ARTERY BIOPSY;  Surgeon: Vernetta Berg, MD;  Location: [facility];  Service: General;  Laterality: N/A;   BREAST BIOPSY Left    BREAST EXCISIONAL BIOPSY Left    benign   CARDIAC CATHETERIZATION  1999&2004   CARPAL TUNNEL RELEASE Bilateral 2003   CATARACT EXTRACTION Bilateral 1990   CHOLECYSTECTOMY N/A 07/05/2021   Procedure: LAPAROSCOPIC CHOLECYSTECTOMY;  Surgeon: Lyndel Deward PARAS, MD;  Location: [facility];  Service: General;  Laterality: N/A;   COLONOSCOPY     ESOPHAGOGASTRODUODENOSCOPY  08/13/2011   Procedure: ESOPHAGOGASTRODUODENOSCOPY (EGD);  Surgeon: Lupita FORBES Commander, MD;  Location: [facility];  Service: Endoscopy;  Laterality: N/A;   implantable loop recorder placement  07/20/2020   Medtronic Reveal Linq model LNQ 22 ([place] [id number]) implantable loop recorder by Dr Kelsie   Allergies  Allergen Reactions   Actos [Pioglitazone] Swelling    Swelling all  over body and moonface   Lisinopril Swelling    Swelling, may have had some breathing involvement. Angioedema   Nsaids Other (See Comments)    Patient reports internal bleeding. Experienced with tolmetin.    Penicillins Shortness Of Breath and Swelling    Arm Swelling with Penicillin (Occurred in 1960s) Breathing - throat swelling with Amoxicillin (Occurred prior to 2002)   Sulfamethoxazole Hives and Itching    welps all over immediately after dose   Flecainide  Other (See Comments)    Blurry  vision   Hctz [Hydrochlorothiazide ] Other (See Comments)    Gout   Iodinated Contrast Media Itching    Pt. Developed mild itching after receiving IV cm; pt. Held; Dr. Garner recomended she take 50 mg of benadryl  when she goes home-if necessary; Dr Tarry recommends benadryl  prior to future exams requiring contrast media, but stated other doctors may recommend another premedication prep.   Prednisone  Other (See Comments)    Agitation, vivid dreams, anxiety does not want to take again.    Statins Other (See Comments)    Muscle aches with multiple statins. Able to tolerate rosuvastatin .   Gabapentin  Other (See Comments)    Caused dysphoria    Losartan  Potassium Swelling    Lower extremity swelling   Zetia  [Ezetimibe ] Other (See Comments)    Cramps         10/15/2023   10:35 AM 07/15/2023    3:00 PM 07/02/2023   11:56 AM  Depression screen PHQ 2/9  Decreased Interest 0 0 0  Down, Depressed, Hopeless 0 0 0  PHQ - 2 Score 0 0 0  Altered sleeping 0 0 0  Tired, decreased energy 0 0 0  Change in appetite 0 0 0  Feeling bad or failure about yourself  0 0 0  Trouble concentrating 0 0 0  Moving slowly or fidgety/restless 0 0 0  Suicidal thoughts 0 0 0  PHQ-9 Score 0 0 0  Difficult doing work/chores Not difficult at all Not difficult at all Not difficult at all       10/15/2023   10:36 AM 07/15/2023    3:00 PM 06/28/2023    2:56 PM 05/15/2023   10:08 AM  GAD 7 : Generalized Anxiety Score   Nervous, Anxious, on Edge 0 0 0 0  Control/stop worrying 0 0 0 0  Worry too much - different things 0 0 0 0  Trouble relaxing 0 0 0 0  Restless 0 0 0 0  Easily annoyed or irritable 0 0 0 0  Afraid - awful might happen  0 0 0 0  Total GAD 7 Score 0 0 0 0  Anxiety Difficulty Not difficult at all Not difficult at all Not difficult at all Not difficult at all      Review of Systems  Constitutional:  Negative for chills and fever.  Respiratory:  Negative for shortness of breath.   Cardiovascular:  Negative for chest pain.  Gastrointestinal:  Negative for abdominal pain, constipation, diarrhea, heartburn, nausea and vomiting.  Genitourinary:  Negative for dysuria, frequency and urgency.  Neurological:  Negative for dizziness and headaches.  Endo/Heme/Allergies:  Negative for polydipsia.  Psychiatric/Behavioral:  Negative for depression and suicidal ideas. The patient is not nervous/anxious.       Objective:     BP 128/60   Pulse 65   Temp 97.6 F (36.4 C) (Oral)   Ht 5' 5 (1.651 m)   Wt 164 lb 6.4 oz (74.6 kg)   SpO2 96%   BMI 27.36 kg/m  BP Readings from Last 3 Encounters:  10/15/23 128/60  10/04/23 (S) (!) 170/90  08/07/23 (!) 170/108   Wt Readings from Last 3 Encounters:  10/15/23 164 lb 6.4 oz (74.6 kg)  10/04/23 165 lb 9.6 oz (75.1 kg)  08/07/23 161 lb 12.8 oz (73.4 kg)      Physical Exam Vitals and nursing note reviewed.  Constitutional:      Appearance: Normal appearance.  Cardiovascular:     Rate and Rhythm: Normal rate and regular rhythm.     Pulses: Normal pulses.     Heart sounds: Normal heart sounds.  Pulmonary:     Effort: Pulmonary effort is normal.     Breath sounds: Normal breath sounds.  Musculoskeletal:       Feet:  Neurological:     Mental Status: She is alert and oriented to person, place, and time.  Psychiatric:        Mood and Affect: Mood normal.        Behavior: Behavior normal.        Thought Content: Thought content normal.         Judgment: Judgment normal.      No results found for any visits on 10/15/23.     The ASCVD Risk score (Arnett DK, et al., 2019) failed to calculate for the following reasons:   The 2019 ASCVD risk score is only valid for ages 37 to 92    Assessment & Plan:  Type 2 diabetes mellitus with chronic kidney disease, without long-term current use of insulin , unspecified CKD stage (HCC) -     Hemoglobin A1c -     Comprehensive metabolic panel with GFR  Chronic diastolic CHF (congestive heart failure) (HCC)  HYPERTENSION, BENIGN SYSTEMIC  Hypothyroidism, unspecified type -     Levothyroxine  Sodium; Take 1 tablet (112 mcg total) by mouth daily before breakfast.  Dispense: 90 tablet; Refill: 3 -     TSH  Paroxysmal atrial fibrillation (HCC) -     dilTIAZem  HCl; One tab by mouth every six hours as needed for atrial fib  Dispense: 30 tablet; Refill: 2  Hyperlipidemia associated with type 2 diabetes mellitus (HCC) -     Lipid panel  Bunionette of left foot -     Ambulatory referral to Podiatry  Urinary frequency    Assessment and Plan Assessment & Plan Type 2 diabetes mellitus Managed with metformin  and Ozempic .  - Check A1c and CMP today. - Continue metformin  500 mg daily. - Continue Ozempic  1 mg weekly. - Check blood  glucose biweekly. - Follow-up in 3 months.  Paroxysmal atrial fibrillation Managed with Eliquis . Financial difficulty due to insurance issues. Previous warfarin use was ineffective. Plans to review Medicare plan in September. - Discuss with AFib clinic about alternative medications. - Consider appealing to manufacturer for Eliquis  assistance through pharmacist  Hypertension Well-controlled with current medication regimen. - Continue Cardizem  360 mg daily. - Continue Cardizem  30 mg as needed. - Continue carvedilol  12.5 mg twice daily. - Follow-up with cardiology.  Reviewed notes.  Hypothyroidism Managed with levothyroxine . Last thyroid  function test  in 2024. - Check TSH levels today. - Refill levothyroxine  112 mcg. - Adjust levothyroxine  dose if TSH levels indicate.  Hyperlipidemia Managed with Crestor . Tolerates medication well. - Check cholesterol levels today. - Continue Crestor  10 mg daily.  Bunionette of left foot Causing pain and pressure. Surgical removal of extra toe at birth reported. - Refer to podiatry in [place] for evaluation and management.  Urinary frequency Previous urology consultation. Prescribed trospium  chloride but not taken. CT scan ordered but not completed. - Call urologist to inquire about CT scan and medication plan. - Consider scheduling follow-up appointment with urologist.  Chronic kidney disease No recent nephrologist follow-up. Last seen at the beginning of the year. - Check kidney function tests today. - Refer to nephrologist if kidney function tests are abnormal.   Return in about 3 months (around 01/15/2024) for chronic care management.[name] Carrol [place], NP

## 2023-10-15 NOTE — Progress Notes (Signed)
 Bableen filled out form and is increasing patient to 2 mg. Form has been faxed back [facility].

## 2023-10-16 ENCOUNTER — Ambulatory Visit (HOSPITAL_COMMUNITY)
Admission: RE | Admit: 2023-10-16 | Discharge: 2023-10-16 | Disposition: A | Discharge status: Home / Self Care | Source: Ambulatory Visit | Attending: Physician Assistant | Admitting: Physician Assistant

## 2023-10-16 VITALS — BP 180/90 | HR 64 | Ht 65.0 in | Wt 167.6 lb

## 2023-10-16 DIAGNOSIS — I10 Essential (primary) hypertension: Secondary | ICD-10-CM

## 2023-10-16 DIAGNOSIS — D6869 Other thrombophilia: Secondary | ICD-10-CM

## 2023-10-16 DIAGNOSIS — I4891 Unspecified atrial fibrillation: Secondary | ICD-10-CM | POA: Diagnosis not present

## 2023-10-16 DIAGNOSIS — I480 Paroxysmal atrial fibrillation: Secondary | ICD-10-CM

## 2023-10-16 NOTE — Patient Instructions (Addendum)
 DISCUSS WITH PCP ABOUT   PRADAXA-DABIGATRAN
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
## 2023-10-18 NOTE — Telephone Encounter (Signed)
   Patient Name: Courtney Pena  DOB: August 13, 1940 MRN: [id number]  Primary Cardiologist: Darliss MD  Clinical pharmacists have reviewed the patient's past medical history, labs, and current medications as part of preoperative protocol coverage. The following recommendations have been made:  Patient with diagnosis of afib on Eliquis  for anticoagulation.     Procedure:  excisional biopsies of the maxilla gingiva  Date of procedure: TBD     CHA2DS2-VASc Score = 6   This indicates a 9.7% annual risk of stroke. The patient's score is based upon: CHF History: 0 HTN History: 1 Diabetes History: 1 Stroke History: 0 Vascular Disease History: 1 Age Score: 2 Gender Score: 1  CrCl 32 ml/min Platelet count 199   Patient has not had an Afib/aflutter ablation within the last 3 months or DCCV within the last 30 days   Patient does not require pre-op antibiotics for dental procedure.   Per office protocol, patient can hold Eliquis  for 1 day prior to procedure.    I will route this recommendation to the requesting party via Epic fax function and remove from pre-op pool.  Please call with questions.  Lamarr Satterfield, NP 10/18/2023, 4:19 PM

## 2023-10-18 NOTE — Telephone Encounter (Signed)
 Patient with diagnosis of afib on Eliquis  for anticoagulation.    Procedure:  excisional biopsies of the maxilla gingiva  Date of procedure: TBD   CHA2DS2-VASc Score = 6   This indicates a 9.7% annual risk of stroke. The patient's score is based upon: CHF History: 0 HTN History: 1 Diabetes History: 1 Stroke History: 0 Vascular Disease History: 1 Age Score: 2 Gender Score: 1      CrCl 32 ml/min Platelet count 199  Patient has not had an Afib/aflutter ablation within the last 3 months or DCCV within the last 30 days  Patient does not require pre-op antibiotics for dental procedure.  Per office protocol, patient can hold Eliquis  for 1 day prior to procedure.    **This guidance is not considered finalized until pre-operative APP has relayed final recommendations.**

## 2023-10-25 ENCOUNTER — Other Ambulatory Visit (HOSPITAL_COMMUNITY): Payer: Self-pay

## 2023-10-25 MED ORDER — APIXABAN 5 MG PO TABS: 5.0000 mg | ORAL_TABLET | Freq: Two times a day (BID) | ORAL | Status: DC

## 2023-10-29 DIAGNOSIS — H34831 Tributary (branch) retinal vein occlusion, right eye, with macular edema: Secondary | ICD-10-CM | POA: Diagnosis not present

## 2023-10-29 DIAGNOSIS — E113213 Type 2 diabetes mellitus with mild nonproliferative diabetic retinopathy with macular edema, bilateral: Secondary | ICD-10-CM | POA: Diagnosis not present

## 2023-10-29 DIAGNOSIS — H26491 Other secondary cataract, right eye: Secondary | ICD-10-CM | POA: Diagnosis not present

## 2023-10-29 DIAGNOSIS — H43822 Vitreomacular adhesion, left eye: Secondary | ICD-10-CM | POA: Diagnosis not present

## 2023-10-29 DIAGNOSIS — H43813 Vitreous degeneration, bilateral: Secondary | ICD-10-CM | POA: Diagnosis not present

## 2023-11-04 ENCOUNTER — Other Ambulatory Visit: Payer: Self-pay | Admitting: Pharmacist

## 2023-11-04 DIAGNOSIS — E1122 Type 2 diabetes mellitus with diabetic chronic kidney disease: Secondary | ICD-10-CM

## 2023-11-04 NOTE — Progress Notes (Signed)
 Brief Telephone Documentation Reason for Call: Incoming call from patient regarding question related to PAP   Summary of Call: Patient notes she is on her last Ozempic  pen. Wanted to see how to request a refill.   Patient's enrollment remains active. Refill form was sent in early August, Nothing else should be needed at this time.   Per automated Nov[employer] phone system, no Ozempic  2 mg refill NOT received.  Per media tab, appears that order form was filled out for quantity of 3 pens so the reorder form was denied. Order qty for Ozempic  must = 4.   Reorder form uploaded to PCP eFax folder, Clinical pool msgd.

## 2023-11-11 ENCOUNTER — Ambulatory Visit (INDEPENDENT_AMBULATORY_CARE_PROVIDER_SITE_OTHER)

## 2023-11-11 DIAGNOSIS — I480 Paroxysmal atrial fibrillation: Secondary | ICD-10-CM | POA: Diagnosis not present

## 2023-11-12 LAB — CUP PACEART REMOTE DEVICE CHECK
Date Time Interrogation Session: 20250913230412
Implantable Pulse Generator Implant Date: 20220525

## 2023-11-16 ENCOUNTER — Ambulatory Visit: Payer: Self-pay | Admitting: Cardiovascular Disease

## 2023-11-18 ENCOUNTER — Ambulatory Visit: Admitting: Podiatry

## 2023-11-18 NOTE — Progress Notes (Signed)
 Remote Loop Recorder Transmission

## 2023-11-21 ENCOUNTER — Encounter

## 2023-11-21 ENCOUNTER — Ambulatory Visit: Payer: Self-pay

## 2023-11-21 NOTE — Telephone Encounter (Signed)
 FYI Only or Action Required?: FYI only for provider.  Patient was last seen in primary care on 10/15/2023 by Vincente Shivers, NP.  Called Nurse Triage reporting Dizziness.  Symptoms began 2 weeks .  Interventions attempted: Rest, hydration, or home remedies.  Symptoms are: gradually worsening.  Triage Disposition: See Physician Within 24 Hours  Patient/caregiver understands and will follow disposition?: yes   Copied from CRM #[id number]. Topic: Clinical - Red Word Triage >> Nov 21, 2023  8:34 AM Rea ORN wrote: Red Word that prompted transfer to Nurse Triage: Dizziness for the past two weeks intermittent and getting worse Reason for Disposition  [1] MODERATE dizziness (e.g., interferes with normal activities) AND [2] has NOT been evaluated by doctor (or NP/PA) for this  (Exception: Dizziness caused by heat exposure, sudden standing, or poor fluid intake.)  Answer Assessment - Initial Assessment Questions 1. DESCRIPTION: Describe your dizziness.     lightheadeness 2. LIGHTHEADED: Do you feel lightheaded? (e.g., somewhat faint, woozy, weak upon standing)     Comes and goes sitting standing 3. VERTIGO: Do you feel like either you or the room is spinning or tilting? (i.e., vertigo)     H/o vertigo  4. SEVERITY: How bad is it?  Do you feel like you are going to faint? Can you stand and walk?     Can stand and walk /no  5. ONSET:  When did the dizziness begin?     2 weeks  6. AGGRAVATING FACTORS: Does anything make it worse? (e.g., standing, change in head position)     Standing  7. HEART RATE: Can you tell me your heart rate? How many beats in 15 seconds?  (Note: Not all patients can do this.)       BP last night 145/81 h/o a fib  8. CAUSE: What do you think is causing the dizziness? (e.g., decreased fluids or food, diarrhea, emotional distress, heat exposure, new medicine, sudden standing, vomiting; unknown)     Sudden standing  10. OTHER SYMPTOMS: Do you have any  other symptoms? (e.g., fever, chest pain, vomiting, diarrhea, bleeding)       Forehead headache BS nl 130's-- took meclizine  without effect-ear popps comes and goes . Facial pain low grade pain  Protocols used: Dizziness - Lightheadedness-A-AH

## 2023-11-22 ENCOUNTER — Encounter: Payer: Self-pay | Admitting: Family Medicine

## 2023-11-22 ENCOUNTER — Ambulatory Visit: Admitting: Family Medicine

## 2023-11-22 VITALS — BP 150/90 | HR 71 | Temp 98.0°F | Ht 65.0 in | Wt 163.0 lb

## 2023-11-22 DIAGNOSIS — R42 Dizziness and giddiness: Secondary | ICD-10-CM

## 2023-11-22 LAB — CBC WITH DIFFERENTIAL/PLATELET
Basophils Absolute: 0.1 K/uL (ref 0.0–0.1)
Basophils Relative: 1.2 % (ref 0.0–3.0)
Eosinophils Absolute: 0.2 K/uL (ref 0.0–0.7)
Eosinophils Relative: 4.1 % (ref 0.0–5.0)
HCT: 33.7 % — ABNORMAL LOW (ref 36.0–46.0)
Hemoglobin: 11 g/dL — ABNORMAL LOW (ref 12.0–15.0)
Lymphocytes Relative: 21.3 % (ref 12.0–46.0)
Lymphs Abs: 1.1 K/uL (ref 0.7–4.0)
MCHC: 32.5 g/dL (ref 30.0–36.0)
MCV: 89.7 fl (ref 78.0–100.0)
Monocytes Absolute: 0.4 K/uL (ref 0.1–1.0)
Monocytes Relative: 7.3 % (ref 3.0–12.0)
Neutro Abs: 3.5 K/uL (ref 1.4–7.7)
Neutrophils Relative %: 66.1 % (ref 43.0–77.0)
Platelets: 214 K/uL (ref 150.0–400.0)
RBC: 3.75 Mil/uL — ABNORMAL LOW (ref 3.87–5.11)
RDW: 16.1 % — ABNORMAL HIGH (ref 11.5–15.5)
WBC: 5.4 K/uL (ref 4.0–10.5)

## 2023-11-22 LAB — COMPREHENSIVE METABOLIC PANEL WITH GFR
ALT: 9 U/L (ref 0–35)
AST: 13 U/L (ref 0–37)
Albumin: 4.3 g/dL (ref 3.5–5.2)
Alkaline Phosphatase: 59 U/L (ref 39–117)
BUN: 29 mg/dL — ABNORMAL HIGH (ref 6–23)
CO2: 33 meq/L — ABNORMAL HIGH (ref 19–32)
Calcium: 10 mg/dL (ref 8.4–10.5)
Chloride: 103 meq/L (ref 96–112)
Creatinine, Ser: 1.52 mg/dL — ABNORMAL HIGH (ref 0.40–1.20)
GFR: 31.5 mL/min — ABNORMAL LOW (ref >60.00)
Glucose, Bld: 134 mg/dL — ABNORMAL HIGH (ref 70–99)
Potassium: 4.3 meq/L (ref 3.5–5.1)
Sodium: 142 meq/L (ref 135–145)
Total Bilirubin: 0.4 mg/dL (ref 0.2–1.2)
Total Protein: 6.9 g/dL (ref 6.0–8.3)

## 2023-11-22 LAB — GLUCOSE, POCT (MANUAL RESULT ENTRY): POC Glucose: 166 mg/dL — AB (ref 70–99)

## 2023-11-22 LAB — VITAMIN B12: Vitamin B-12: 213 pg/mL (ref 211–911)

## 2023-11-22 LAB — TSH: TSH: 3.67 u[IU]/mL (ref 0.35–5.50)

## 2023-11-22 MED ORDER — FLUTICASONE PROPIONATE 50 MCG/ACT NA SUSP: 2.0000 | Freq: Every day | NASAL | 2 refills | Status: DC

## 2023-11-22 NOTE — Progress Notes (Signed)
 Patient ID: Courtney Pena, female    DOB: 12-Oct-1940, 83 y.o.   MRN: [id number]  This visit was conducted in person.  BP (!) 150/90   Pulse 71   Temp 98 F (36.7 C) (Temporal)   Ht 5' 5 (1.651 m)   Wt 163 lb (73.9 kg)   SpO2 97%   BMI 27.12 kg/m    CC:  Chief Complaint  Patient presents with   Dizziness    Comes and Go    Subjective:   HPI: Courtney Pena is a 83 y.o. female presenting on 11/22/2023 for Dizziness (Comes and Go)  New onset  dizziness in last 2 weeks.  Describes as  feeling off balance.. lasts about 30 min to 1 hour  Notes when going from  lying to standing. No issues rolling over in bed,  slightly worse at time with turning head.  Few times felt ears full in lat few months.  No SOB, no fatigued, no CP, only occ palpitations BP at home lately 135/75  No N/V, does not drink enough water but this is not new.  No bleeding.  Mild frontal headache associated.  No presyncope.  Has history of Pafib.[name] occ using additional episode of diltiazem  prn.  Has had episodes of vertigo in past every 3-4 years... has used meclizine  in past.  She took some meclizine  .. Helped some.    She has not taken meds yet today... feels somewhat anxious. BP Readings from Last 3 Encounters:  11/29/23 124/68  11/26/23 (!) 144/87  11/22/23 (!) 150/90     Relevant past medical, surgical, family and social history reviewed and updated as indicated. Interim medical history since our last visit reviewed. Allergies and medications reviewed and updated. Outpatient Medications Prior to Visit  Medication Sig Dispense Refill   allopurinol  (ZYLOPRIM ) 100 MG tablet Take 1 tablet (100 mg total) by mouth daily. 90 tablet 0   apixaban  (ELIQUIS ) 5 MG TABS tablet Take 1 tablet (5 mg total) by mouth 2 (two) times daily. 28 tablet    Apoaequorin (PREVAGEN PO) Take 1 tablet by mouth daily.     blood glucose meter kit and supplies Dispense based on patient and insurance  preference. Use up to four times daily as directed. (FOR ICD-10 E10.9, E11.9). 1 each 0   Blood Glucose Monitoring Suppl (ONE TOUCH ULTRA 2) w/Device KIT 1 each by Does not apply route daily. Use glucometer to check blood sugar 3 times daily. E11.22 1 kit 0   carvedilol  (COREG ) 12.5 MG tablet Take 1 tablet (12.5 mg total) by mouth 2 (two) times daily. 180 tablet 3   Coenzyme Q10 (COQ10) 100 MG CAPS Take 100 mg by mouth daily.     diclofenac  Sodium (VOLTAREN ) 1 % GEL Apply 4 g topically 4 (four) times daily. 100 g 1   diltiazem  (CARDIZEM  CD) 360 MG 24 hr capsule Take 1 capsule (360 mg total) by mouth daily. 90 capsule 1   diltiazem  (CARDIZEM ) 30 MG tablet One tab by mouth every six hours as needed for atrial fib 30 tablet 2   glucose blood (ONETOUCH ULTRA) test strip 1 Container by Other route every morning.     glucose blood (ONETOUCH ULTRA) test strip Use to check blood sugar up to three times daily as instructed. E11.22 300 strip 3   hydrOXYzine  (ATARAX ) 10 MG tablet Take 1 tablet (10 mg total) by mouth 2 (two) times daily as needed for itching. 30 tablet 0   Insulin   Pen Needle 31G X 8 MM MISC Use with pen to inject once daily. 100 each 3   Lancets (ONETOUCH ULTRASOFT) lancets Use to check blood sugar up to 3 times daily. E11.22 300 each 3   levothyroxine  (SYNTHROID ) 125 MCG tablet Take 1 tablet (125 mcg total) by mouth daily. 90 tablet 1   meclizine  (ANTIVERT ) 25 MG tablet TAKE 1 TABLET (25 MG TOTAL) BY MOUTH AS NEEDED. 30 tablet 3   metFORMIN  (GLUCOPHAGE ) 500 MG tablet Take by mouth daily with breakfast.     Multiple Vitamin (MULTI-VITAMINS) TABS Take 1 tablet by mouth daily.     nitroGLYCERIN  (NITROSTAT ) 0.4 MG SL tablet Place 1 tablet (0.4 mg total) under the tongue every 5 (five) minutes as needed for chest pain (Call 911 if chest pain after three doses). 25 tablet 3   rosuvastatin  (CRESTOR ) 10 MG tablet Take 1 tablet (10 mg total) by mouth daily. 90 tablet 1   Semaglutide , 1 MG/DOSE, 4  MG/3ML SOPN Inject 1 mg as directed once a week.     VITAMIN D , CHOLECALCIFEROL , PO Take by mouth. 4000mg      omeprazole  (PRILOSEC) 40 MG capsule Take 1 capsule (40 mg total) by mouth daily. 90 capsule 1   trospium  (SANCTURA ) 20 MG tablet Take 1 tablet (20 mg total) by mouth 2 (two) times daily. (Patient not taking: Reported on 11/22/2023) 60 tablet 1   No facility-administered medications prior to visit.     Per HPI unless specifically indicated in ROS section below Review of Systems  Constitutional:  Negative for fatigue and fever.  HENT:  Negative for congestion.   Eyes:  Negative for pain.  Respiratory:  Negative for cough and shortness of breath.   Cardiovascular:  Negative for chest pain, palpitations and leg swelling.  Gastrointestinal:  Negative for abdominal pain.  Genitourinary:  Negative for dysuria and vaginal bleeding.  Musculoskeletal:  Negative for back pain.  Neurological:  Positive for dizziness. Negative for syncope, light-headedness and headaches.  Psychiatric/Behavioral:  Negative for dysphoric mood.    Objective:  BP (!) 150/90   Pulse 71   Temp 98 F (36.7 C) (Temporal)   Ht 5' 5 (1.651 m)   Wt 163 lb (73.9 kg)   SpO2 97%   BMI 27.12 kg/m   Wt Readings from Last 3 Encounters:  11/29/23 160 lb (72.6 kg)  11/22/23 163 lb (73.9 kg)  10/16/23 167 lb 9.6 oz (76 kg)      Physical Exam Constitutional:      General: She is not in acute distress.    Appearance: Normal appearance. She is well-developed. She is not ill-appearing or toxic-appearing.  HENT:     Head: Normocephalic.     Right Ear: Hearing, tympanic membrane, ear canal and external ear normal. Tympanic membrane is not erythematous, retracted or bulging.     Left Ear: Hearing, tympanic membrane, ear canal and external ear normal. Tympanic membrane is not erythematous, retracted or bulging.     Nose: No mucosal edema or rhinorrhea.     Right Sinus: No maxillary sinus tenderness or frontal sinus  tenderness.     Left Sinus: No maxillary sinus tenderness or frontal sinus tenderness.     Mouth/Throat:     Pharynx: Uvula midline.  Eyes:     General: Lids are normal. Lids are everted, no foreign bodies appreciated.     Conjunctiva/sclera: Conjunctivae normal.     Pupils: Pupils are equal, round, and reactive to light.  Neck:  Thyroid : No thyroid  mass or thyromegaly.     Vascular: No carotid bruit.     Trachea: Trachea normal.  Cardiovascular:     Rate and Rhythm: Normal rate and regular rhythm.     Pulses: Normal pulses.     Heart sounds: Normal heart sounds, S1 normal and S2 normal. No murmur heard.    No friction rub. No gallop.  Pulmonary:     Effort: Pulmonary effort is normal. No tachypnea or respiratory distress.     Breath sounds: Normal breath sounds. No decreased breath sounds, wheezing, rhonchi or rales.  Abdominal:     General: Bowel sounds are normal.     Palpations: Abdomen is soft.     Tenderness: There is no abdominal tenderness.  Musculoskeletal:     Cervical back: Normal range of motion and neck supple.  Skin:    General: Skin is warm and dry.     Findings: No rash.  Neurological:     Mental Status: She is alert.  Psychiatric:        Mood and Affect: Mood is not anxious or depressed.        Speech: Speech normal.        Behavior: Behavior normal. Behavior is cooperative.        Thought Content: Thought content normal.        Judgment: Judgment normal.       Results for orders placed or performed in visit on 11/22/23  POCT glucose (manual entry)   Collection Time: 11/22/23 12:16 PM  Result Value Ref Range   POC Glucose 166 (A) 70 - 99 mg/dl  Comprehensive metabolic panel with GFR   Collection Time: 11/22/23 12:49 PM  Result Value Ref Range   Sodium 142 135 - 145 mEq/L   Potassium 4.3 3.5 - 5.1 mEq/L   Chloride 103 96 - 112 mEq/L   CO2 33 (H) 19 - 32 mEq/L   Glucose, Bld 134 (H) 70 - 99 mg/dL   BUN 29 (H) 6 - 23 mg/dL   Creatinine, Ser 8.47  (H) 0.40 - 1.20 mg/dL   Total Bilirubin 0.4 0.2 - 1.2 mg/dL   Alkaline Phosphatase 59 39 - 117 U/L   AST 13 0 - 37 U/L   ALT 9 0 - 35 U/L   Total Protein 6.9 6.0 - 8.3 g/dL   Albumin 4.3 3.5 - 5.2 g/dL   GFR 68.49 (L) >39.99 mL/min   Calcium  10.0 8.4 - 10.5 mg/dL  CBC with Differential/Platelet   Collection Time: 11/22/23 12:49 PM  Result Value Ref Range   WBC 5.4 4.0 - 10.5 K/uL   RBC 3.75 (L) 3.87 - 5.11 Mil/uL   Hemoglobin 11.0 (L) 12.0 - 15.0 g/dL   HCT 66.2 (L) 63.9 - 53.9 %   MCV 89.7 78.0 - 100.0 fl   MCHC 32.5 30.0 - 36.0 g/dL   RDW 83.8 (H) 88.4 - 84.4 %   Platelets 214.0 150.0 - 400.0 K/uL   Neutrophils Relative % 66.1 43.0 - 77.0 %   Lymphocytes Relative 21.3 12.0 - 46.0 %   Monocytes Relative 7.3 3.0 - 12.0 %   Eosinophils Relative 4.1 0.0 - 5.0 %   Basophils Relative 1.2 0.0 - 3.0 %   Neutro Abs 3.5 1.4 - 7.7 K/uL   Lymphs Abs 1.1 0.7 - 4.0 K/uL   Monocytes Absolute 0.4 0.1 - 1.0 K/uL   Eosinophils Absolute 0.2 0.0 - 0.7 K/uL   Basophils Absolute 0.1 0.0 - 0.1  K/uL  Vitamin B12   Collection Time: 11/22/23 12:49 PM  Result Value Ref Range   Vitamin B-12 213 211 - 911 pg/mL  TSH   Collection Time: 11/22/23 12:49 PM  Result Value Ref Range   TSH 3.67 0.35 - 5.50 uIU/mL   *Note: Due to a large number of results and/or encounters for the requested time period, some results have not been displayed. A complete set of results can be found in Results Review.    Assessment and Plan  Dizziness Assessment & Plan:  Acute, most likely secondary to BPPV.  She does have some evidence of eustachian tube dysfunction.  Will check labs to rule out secondary causes.  Start Flonase  2 spray per nostril daily.  Start home balance retraining exercises.  Can use meclizine  every 8 hours as needed but this can make you sleepy. Call if not improving as expected.  Orders: -     POCT glucose (manual entry) -     Comprehensive metabolic panel with GFR -     CBC with  Differential/Platelet -     Vitamin B12 -     TSH  Other orders -     Fluticasone  Propionate; Place 2 sprays into both nostrils daily.  Dispense: 16 g; Refill: 2    No follow-ups on file.   Greig Ring, MD

## 2023-11-22 NOTE — Patient Instructions (Addendum)
 Please stop at the lab to have labs drawn... we will call with the results.  Start Flonase  2 spray per nostril daily.  Start home balance retraining exercises.  Can use meclizine  every 8 hours as needed but this can make you sleepy. Call if not improving as expected.

## 2023-11-26 ENCOUNTER — Ambulatory Visit: Admitting: General Practice

## 2023-11-26 ENCOUNTER — Emergency Department
Admission: EM | Admit: 2023-11-26 | Discharge: 2023-11-26 | Disposition: A | Discharge status: Home / Self Care | Attending: Emergency Medicine | Admitting: Emergency Medicine

## 2023-11-26 ENCOUNTER — Ambulatory Visit: Admitting: Family Medicine

## 2023-11-26 ENCOUNTER — Ambulatory Visit: Payer: Self-pay

## 2023-11-26 ENCOUNTER — Ambulatory Visit: Payer: Self-pay | Admitting: Family Medicine

## 2023-11-26 ENCOUNTER — Emergency Department

## 2023-11-26 DIAGNOSIS — D259 Leiomyoma of uterus, unspecified: Secondary | ICD-10-CM | POA: Insufficient documentation

## 2023-11-26 DIAGNOSIS — N3000 Acute cystitis without hematuria: Secondary | ICD-10-CM | POA: Insufficient documentation

## 2023-11-26 DIAGNOSIS — K449 Diaphragmatic hernia without obstruction or gangrene: Secondary | ICD-10-CM | POA: Diagnosis not present

## 2023-11-26 DIAGNOSIS — E119 Type 2 diabetes mellitus without complications: Secondary | ICD-10-CM | POA: Diagnosis not present

## 2023-11-26 DIAGNOSIS — I10 Essential (primary) hypertension: Secondary | ICD-10-CM | POA: Insufficient documentation

## 2023-11-26 DIAGNOSIS — K5730 Diverticulosis of large intestine without perforation or abscess without bleeding: Secondary | ICD-10-CM | POA: Diagnosis not present

## 2023-11-26 DIAGNOSIS — Q613 Polycystic kidney, unspecified: Secondary | ICD-10-CM | POA: Diagnosis not present

## 2023-11-26 DIAGNOSIS — Z7901 Long term (current) use of anticoagulants: Secondary | ICD-10-CM | POA: Diagnosis not present

## 2023-11-26 DIAGNOSIS — R1031 Right lower quadrant pain: Secondary | ICD-10-CM

## 2023-11-26 DIAGNOSIS — I2510 Atherosclerotic heart disease of native coronary artery without angina pectoris: Secondary | ICD-10-CM | POA: Diagnosis not present

## 2023-11-26 DIAGNOSIS — N281 Cyst of kidney, acquired: Secondary | ICD-10-CM | POA: Diagnosis not present

## 2023-11-26 LAB — URINALYSIS, ROUTINE W REFLEX MICROSCOPIC
Bilirubin Urine: NEGATIVE
Glucose, UA: NEGATIVE mg/dL
Hgb urine dipstick: NEGATIVE
Ketones, ur: NEGATIVE mg/dL
Nitrite: NEGATIVE
Protein, ur: >=300 mg/dL — AB
Specific Gravity, Urine: 1.022 (ref 1.005–1.030)
pH: 5 (ref 5.0–8.0)

## 2023-11-26 LAB — CBC
HCT: 32.9 % — ABNORMAL LOW (ref 36.0–46.0)
Hemoglobin: 10.4 g/dL — ABNORMAL LOW (ref 12.0–15.0)
MCH: 29.6 pg (ref 26.0–34.0)
MCHC: 31.6 g/dL (ref 30.0–36.0)
MCV: 93.7 fL (ref 80.0–100.0)
Platelets: 211 K/uL (ref 150–400)
RBC: 3.51 MIL/uL — ABNORMAL LOW (ref 3.87–5.11)
RDW: 15.6 % — ABNORMAL HIGH (ref 11.5–15.5)
WBC: 5.3 K/uL (ref 4.0–10.5)
nRBC: 0 % (ref 0.0–0.2)

## 2023-11-26 LAB — COMPREHENSIVE METABOLIC PANEL WITH GFR
ALT: 11 U/L (ref 0–44)
AST: 16 U/L (ref 15–41)
Albumin: 3.5 g/dL (ref 3.5–5.0)
Alkaline Phosphatase: 56 U/L (ref 38–126)
Anion gap: 8 (ref 5–15)
BUN: 25 mg/dL — ABNORMAL HIGH (ref 8–23)
CO2: 26 mmol/L (ref 22–32)
Calcium: 9.4 mg/dL (ref 8.9–10.3)
Chloride: 104 mmol/L (ref 98–111)
Creatinine, Ser: 1.26 mg/dL — ABNORMAL HIGH (ref 0.44–1.00)
GFR, Estimated: 42 mL/min — ABNORMAL LOW (ref >60)
Glucose, Bld: 132 mg/dL — ABNORMAL HIGH (ref 70–99)
Potassium: 3.9 mmol/L (ref 3.5–5.1)
Sodium: 138 mmol/L (ref 135–145)
Total Bilirubin: 0.7 mg/dL (ref 0.0–1.2)
Total Protein: 7 g/dL (ref 6.5–8.1)

## 2023-11-26 LAB — LIPASE, BLOOD: Lipase: 60 U/L — ABNORMAL HIGH (ref 11–51)

## 2023-11-26 MED ORDER — LACTATED RINGERS IV BOLUS
1000.0000 mL | Freq: Once | INTRAVENOUS | Status: AC
  Administered 2023-11-26: 1000 mL via INTRAVENOUS

## 2023-11-26 MED ORDER — CEPHALEXIN 500 MG PO CAPS
500.0000 mg | ORAL_CAPSULE | Freq: Four times a day (QID) | ORAL | 0 refills | Status: AC
Start: 2023-11-26 — End: 2023-12-03

## 2023-11-26 MED ORDER — CEPHALEXIN 500 MG PO CAPS
500.0000 mg | ORAL_CAPSULE | Freq: Once | ORAL | Status: AC
  Administered 2023-11-26: 500 mg via ORAL
  Filled 2023-11-26: qty 1

## 2023-11-26 NOTE — Telephone Encounter (Signed)
 FYI Only or Action Required?: FYI only for provider.  Patient was last seen in primary care on 11/22/2023 by Avelina Greig BRAVO, MD.  Called Nurse Triage reporting Dizziness and Abdominal Pain.  Symptoms began several days ago.  Interventions attempted: Rest, hydration, or home remedies.  Symptoms are: rapidly worsening.  Triage Disposition: Go to ED Now (Notify PCP)  Patient/caregiver understands and will follow disposition?: Yes Reason for Disposition  [1] SEVERE pain AND [2] age > 60 years  [1] SEVERE pain (e.g., excruciating) AND [2] present > 1 hour  Answer Assessment - Initial Assessment Questions Patient was transferred to this RN for telephone triage. States that her dizziness improves slightly when taking meclizine , but is still present.  She then  mentioned severe abdominal pain described as someone tying a knot that radiates from the right lumbar quadrant to her right side and down to her groin. States that there was a gradual onset over 4-5 days and that it is now a constant 9/10. Endorses an occasional pulsing sensation.   Patient was advised to go to the ED via another driver or EMS. Stated she will call a family member to drive her now.   LOCATION: Where does it hurt?      To the right of her belly button, radiates to side and down to groin  ONSET: When did the pain begin? (e.g., minutes, hours or days ago)      4-5 days, now severe  SUDDEN: Gradual or sudden onset?     Gradual  PATTERN Does the pain come and go, or is it constant?     Constant  SEVERITY: How bad is the pain?  (e.g., Scale 1-10; mild, moderate, or severe)     9/10  Protocols used: Abdominal Pain - [facility] Copied from CRM #[id number]. Topic: Clinical - Red Word Triage >> Nov 26, 2023  1:01 PM Taleah C wrote: Red Word that prompted transfer to Nurse Triage: persistent dizziness and issues with hernia

## 2023-11-26 NOTE — ED Provider Notes (Signed)
 [facility] Provider Note    Event Date/Time   First MD Initiated Contact with Patient 11/26/23 1851     (approximate)   History   Chief Complaint Flank Pain and Dizziness   HPI  Courtney Pena is a 83 y.o. female with past medical history of hypertension, diabetes, CAD, atrial fibrillation on Eliquis , and rheumatoid arthritis who presents to the ED complaining of flank pain.  Patient reports that she has been dealing with intermittent squeezing discomfort in her right flank and the right lower quadrant of her abdomen for about the past year.  Pain has been mild and manageable up until the past couple of days, when it has seemed to get acutely worse.  She denies any associated nausea or vomiting and has not had any changes in her bowel movements.  She does endorse urinary frequency but denies any hematuria, dysuria, or fever.  She reports prior right-sided inguinal hernia surgery 2 years ago.  Patient also endorses dizziness and lightheadedness when standing for the past couple of weeks, denies chest pain or shortness of breath.     Physical Exam   Triage Vital Signs: ED Triage Vitals  Encounter Vitals Group     BP 11/26/23 1511 (!) 153/95     Girls Systolic BP Percentile --      Girls Diastolic BP Percentile --      Boys Systolic BP Percentile --      Boys Diastolic BP Percentile --      Pulse Rate 11/26/23 1511 79     Resp 11/26/23 1511 17     Temp 11/26/23 1511 98.3 F (36.8 C)     Temp src --      SpO2 11/26/23 1511 97 %     Weight --      Height --      Head Circumference --      Peak Flow --      Pain Score 11/26/23 1512 8     Pain Loc --      Pain Education --      Exclude from Growth Chart --     Most recent vital signs: Vitals:   11/26/23 1931 11/26/23 1933  BP:  (!) 178/83  Pulse:    Resp:    Temp: 98.3 F (36.8 C)   SpO2:      Constitutional: Alert and oriented. Eyes: Conjunctivae are normal. Head:  Atraumatic. Nose: No congestion/rhinnorhea. Mouth/Throat: Mucous membranes are moist.  Cardiovascular: Normal rate, regular rhythm. Grossly normal heart sounds.  2+ radial pulses bilaterally. Respiratory: Normal respiratory effort.  No retractions. Lungs CTAB. Gastrointestinal: Soft and tender to palpation in the right lower quadrant with no rebound or guarding.  No CVA tenderness bilaterally. No distention. Musculoskeletal: No lower extremity tenderness nor edema.  Neurologic:  Normal speech and language. No gross focal neurologic deficits are appreciated.    ED Results / Procedures / Treatments   Labs (all labs ordered are listed, but only abnormal results are displayed) Labs Reviewed  LIPASE, BLOOD - Abnormal; Notable for the following components:      Result Value   Lipase 60 (*)    All other components within normal limits  COMPREHENSIVE METABOLIC PANEL WITH GFR - Abnormal; Notable for the following components:   Glucose, Bld 132 (*)    BUN 25 (*)    Creatinine, Ser 1.26 (*)    GFR, Estimated 42 (*)    All other components within normal limits  CBC -  Abnormal; Notable for the following components:   RBC 3.51 (*)    Hemoglobin 10.4 (*)    HCT 32.9 (*)    RDW 15.6 (*)    All other components within normal limits  URINALYSIS, ROUTINE W REFLEX MICROSCOPIC - Abnormal; Notable for the following components:   Color, Urine YELLOW (*)    APPearance HAZY (*)    Protein, ur >=300 (*)    Leukocytes,Ua SMALL (*)    Bacteria, UA FEW (*)    All other components within normal limits  URINE CULTURE  CBG MONITORING, ED     EKG  ED ECG REPORT I, Carlin Palin, the attending physician, personally viewed and interpreted this ECG.   Date: 11/26/2023  EKG Time: 15:19  Rate: 72  Rhythm: normal sinus rhythm  Axis: Normal  Intervals:none  ST&T Change: None  RADIOLOGY CT abdomen/pelvis reviewed and interpreted by me with no inflammatory changes, focal fluid collections, or  dilated bowel loops.  PROCEDURES:  Critical Care performed: No  Procedures   MEDICATIONS ORDERED IN ED: Medications  cephALEXin  (KEFLEX ) capsule 500 mg (has no administration in time range)  lactated ringers  bolus 1,000 mL (1,000 mLs Intravenous New Bag/Given 11/26/23 1929)     IMPRESSION / MDM / ASSESSMENT AND PLAN / ED COURSE  I reviewed the triage vital signs and the nursing notes.                              83 y.o. female with past medical history of hypertension, diabetes, CAD, atrial fibrillation on Eliquis , and rheumatoid of the right is who presents to the ED with 2 days of acute on chronic right-sided abdominal pain with some dizziness and lightheadedness.  Patient's presentation is most consistent with acute presentation with potential threat to life or bodily function.  Differential diagnosis includes, but is not limited to, appendicitis, recurrent hernia, kidney stone, pyelonephritis, diverticulitis, cholecystitis, biliary colic, hepatitis, pancreatitis.  Patient nontoxic-appearing and in no acute distress, vital signs are unremarkable.  EKG shows no evidence of arrhythmia or ischemia and lightheadedness seems consistent with orthostatic hypotension.  No neurologic deficits to suggest stroke and symptoms do not sound consistent with vertigo.  Labs without significant anemia, leukocytosis, electrolyte abnormality, or AKI.  LFTs are unremarkable, urinalysis with possible UTI and we will send for culture.  Plan to further assess with CT imaging of her abdomen/pelvis, hydrate with IV fluid bolus.  CT imaging is negative for acute finding, does show polycystic kidney disease as well as uterine fibroids, which could be contributing to her symptoms.  With borderline urinalysis and urinary frequency, will treat for UTI.  She is appropriate for outpatient management and referral provided to OB/GYN, she was also counseled to follow-up with her PCP.  She was counseled to return to the  ED for new or worsening symptoms, patient agrees with plan.      FINAL CLINICAL IMPRESSION(S) / ED DIAGNOSES   Final diagnoses:  RLQ abdominal pain  Uterine leiomyoma, unspecified location  Polycystic kidney  Acute cystitis without hematuria     Rx / DC Orders   ED Discharge Orders          Ordered    cephALEXin  (KEFLEX ) 500 MG capsule  4 times daily        11/26/23 2152             Note:  This document was prepared using Dragon voice recognition software and may  include unintentional dictation errors.   Willo Dunnings, MD 11/26/23 2153

## 2023-11-26 NOTE — Telephone Encounter (Signed)
 Noted; agree with ED disposition.   -Carrol Aurora, DNP, AGNP-C 11/26/2023 1:25 PM

## 2023-11-26 NOTE — ED Triage Notes (Addendum)
 Pt reports she has been experiencing right sided flank pain that radiates to lower abdomen for the past year. She states over the past couple of days the pain has worsened. She reports concern that the pain is related to past hernia repair. Pt AxOx4. Denies n/v/d. She also reports intermittent dizziness for the past couple of weeks.

## 2023-11-27 ENCOUNTER — Ambulatory Visit: Payer: Self-pay

## 2023-11-27 LAB — URINE CULTURE

## 2023-11-27 NOTE — Telephone Encounter (Signed)
  Reason for Disposition  [1] Condition / symptoms BETTER (improving) AND [2] caller has additional questions triager can answer  Answer Assessment - Initial Assessment Questions 1. REASON FOR CALL: What is the main reason for your call? or How can I best help you?      Patient called to speak to this RN to thank me for assisting her yesterday and recommending that she go to the ED.   Patient has a hospital f/u scheduled already. Will also f/u with nephrology and urology.  This RN reviewed instructions for cephalexin  that she was discharged on, recommended that she take it with food to help prevent GI upset, and to call if she experiences any side effects.   Patient reports some improvement in her symptoms since hospital discharge. This RN advised her to call back with any additional questions or if she does not continue to experience improvement.  4. HOSPITALIZATION: How long were you hospitalized? (e.g., days)     Not admitted  5. DISCHARGE DIAGNOSIS:  What problem or disease were you hospitalized for?     RLQ abdominal pain     Uterine leiomyoma, unspecified location     Polycystic kidney     Acute cystitis without hematuria  6. DISCHARGE DATE: What date were you discharged from the hospital?     11/26/23  7. DISCHARGE APPOINTMENT: Have you scheduled a follow-up discharge appointment with your doctor?     Yes 11/29/23  8. DISCHARGE MEDICINES: Did the doctor (or NP/PA) who discharged you order any new medicines for you to use? If yes, have you filled the prescription and started taking the medicine?      Cephalexin   9. PAIN: Is there any pain?        Yes, improving since yesterday  Protocols used: Post-Hospitalization Follow-up Call-[facility] Copied from CRM #[id number]. Topic: Clinical - Medical Advice >> Nov 27, 2023 11:04 AM Thersia BROCKS wrote: Reason for CRM: Patient called in stated she needed to speak with Nurse Karna regarding her speaking with her yesterday, informed  her that it isnt a guarantee that she may get the call back from [facility] but patient wanted me to send the message anyway to see if she could give her a callback

## 2023-11-29 ENCOUNTER — Encounter: Payer: Self-pay | Admitting: General Practice

## 2023-11-29 ENCOUNTER — Ambulatory Visit (INDEPENDENT_AMBULATORY_CARE_PROVIDER_SITE_OTHER): Admitting: General Practice

## 2023-11-29 VITALS — BP 124/68 | HR 67 | Temp 98.0°F | Ht 65.0 in | Wt 160.0 lb

## 2023-11-29 DIAGNOSIS — Z09 Encounter for follow-up examination after completed treatment for conditions other than malignant neoplasm: Secondary | ICD-10-CM | POA: Diagnosis not present

## 2023-11-29 DIAGNOSIS — D219 Benign neoplasm of connective and other soft tissue, unspecified: Secondary | ICD-10-CM

## 2023-11-29 DIAGNOSIS — Q613 Polycystic kidney, unspecified: Secondary | ICD-10-CM | POA: Diagnosis not present

## 2023-11-29 NOTE — Progress Notes (Signed)
 Established Patient Office Visit  Subjective   Patient ID: Courtney Pena, female    DOB: 11-02-1940  Age: 83 y.o. MRN: [id number]  Chief Complaint  Patient presents with   Hospitalization Follow-up    Doing better since visit. Discuss CT done at hospital.    HPI  Courtney Pena. Beets is a 83 year old female with past medical history of HTN, pulmonary nodule, hypothyroidism, DM2, RA, HLD presents today for a hospital f/u.   Discussed the use of AI scribe software for clinical note transcription with the patient, who gave verbal consent to proceed.  History of Present Illness Courtney Pena is an 83 year old female with chronic kidney disease who presents with right lower quadrant pain and urinary symptoms.  She experiences significant right lower quadrant pain, described as a 'twisting knot situation,' radiating from the groin area upwards on the right side, with no pain on the left side, particularly around the navel. No fevers or chills accompany the pain. The pain led to an emergency room visit where she received IV fluids, which alleviated her dizziness.  She has a history of dizziness, previously unresponsive to Flonase , but resolved after receiving IV fluids in the emergency room. The dizziness would subside when she lowered her head but returned upon standing or moving.  She has chronic kidney disease and follows up with a kidney doctor.   She experiences frequent urination and a strong urge to urinate. She was referred to a urologist who initially planned a procedure but later deemed it unnecessary. A CT scan was discussed, but she is unsure if it was completed. She had a CT scan abd/pelvis without contrast at the ER.   She has a history of polycystic kidney disease, as indicated by a CT scan. She also has a small hiatal hernia, diverticulosis, and fibroids, which were noted on the CT scan.     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) 05/15/2023   Itchy eyes 01/02/2023   History of gout 08/28/2022   Orthostatic hypotension 08/02/2022   Dizziness 07/02/2022   Subacute cough 07/02/2022   Right foot drop 05/07/2022   Tenosynovitis of left foot 10/19/2020   Hypercoagulable state due to paroxysmal atrial fibrillation (HCC) 10/03/2020   Rheumatoid arthritis (HCC) 03/21/2020   Tinnitus 03/21/2020   Idiopathic chronic gout of multiple sites without tophus 12/09/2019   Lumbar back pain with radiculopathy affecting left lower extremity 07/30/2019   Rotator cuff syndrome of right shoulder 11/24/2018   Hypertensive retinopathy of both eyes 10/14/2018   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 (HCC) 10/14/2018   Vitreomacular adhesion of left eye 10/14/2018   Diabetic retinopathy, nonproliferative, moderate (HCC) 03/07/2018   Chronic diastolic CHF (congestive heart failure) (HCC) 08/16/2017   Sudden hearing loss, left 09/25/2016   Chronic anticoag - Xarelto  05//2017, CHADS2CVASC=5 Eliquis  9/20 07/07/2015   Vitamin D  deficiency 09/18/2013   Hyperlipidemia associated with type 2 diabetes mellitus (HCC) 08/22/2012   Pseudophakia, both eyes 03/12/2012   Presence of intraocular lens 03/12/2012   Pulmonary nodule 12/26/2010   Paroxysmal atrial fibrillation (HCC) 12/14/2010   Urinary frequency 07/13/2010   Hypothyroidism 04/25/2006   Type 2 diabetes mellitus with diabetic chronic kidney disease (HCC) 04/25/2006   HYPERCHOLESTEROLEMIA 04/25/2006   HYPERTENSION, BENIGN SYSTEMIC 04/25/2006  Coronary atherosclerosis 04/25/2006   Reflux esophagitis 04/25/2006   VERTIGO NOS OR DIZZINESS 04/25/2006   Past Medical History:  Diagnosis Date   Adenomatous polyp of colon 10/23/2021   Last colonoscopy was 02/2018 recommended 3 year follow  up.       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  Did not tolerate zaralto for PAF.  Seems to tolerate ASA.[name]  Likely needs long term iron  to keep up with ongoing loss.     Arthritis    Atrial flutter (HCC) 12/07/2010   converted in ED with 300 mg flecainide    CAD (coronary artery disease)    a. mild per cath in 2004;  b. nonischemic Myoview  in March 2012;  c. Lex MV 1/14:  EF 66%, no ischemia   Cataract    Cold intolerance of hand 12/18/2021   Degenerative arthritis of thumb 02/12/2014   Disequilibrium 04/25/2006   Formatting of this note might be different from the original. Formatting of this note might be different from the original. Qualifier: Diagnosis of By: Scarlet MD, Elsie Last Assessment & Plan: Formatting of this note might be different from the original. Now describing ear fullness more on left side.  Perhaps the removal of cerumen impaction will help.  No further WU given previously seen by ENT   Diverticulosis of colon 04/25/2006   Qualifier: Diagnosis of   By: Scarlet MD, Elsie      Replacing diagnoses that were inactivated after the 05/28/22 regulatory import     DJD, UNSPECIFIED 04/25/2006   Qualifier: Diagnosis of   By: Scarlet MD, Elsie      Replacing diagnoses that were inactivated after the 05/28/22 regulatory import     Dysrhythmia    A-Fib. 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     Hyperlipidemia    Hypertension    Hypothyroidism    Neuromuscular disorder  (HCC)    neuropathy in feet   Obesity    Personal history of colonic polyps 06/06/2009   cecal polyp   Rheumatoid arthritis (HCC)    Shingles 2023   Sleep apnea    lost weight no longer needs   Type 2 diabetes mellitus with diabetic chronic kidney disease (HCC) 04/25/2006       Past Surgical History:  Procedure Laterality Date   ARTERY BIOPSY N/A 08/31/2021   Procedure: LEFT TEMPORAL ARTERY BIOPSY;  Surgeon: Vernetta Berg, MD;  Location: [facility];  Service: General;  Laterality: N/A;   BREAST BIOPSY Left    BREAST EXCISIONAL BIOPSY Left    benign   CARDIAC CATHETERIZATION  1999&2004   CARPAL TUNNEL RELEASE Bilateral 2003   CATARACT EXTRACTION Bilateral 1990   CHOLECYSTECTOMY N/A 07/05/2021   Procedure: LAPAROSCOPIC CHOLECYSTECTOMY;  Surgeon: Lyndel Deward PARAS, MD;  Location: [facility];  Service: General;  Laterality: N/A;   COLONOSCOPY     ESOPHAGOGASTRODUODENOSCOPY  08/13/2011   Procedure: ESOPHAGOGASTRODUODENOSCOPY (EGD);  Surgeon: Lupita FORBES Commander, MD;  Location: [facility];  Service: Endoscopy;  Laterality: N/A;   implantable loop recorder placement  07/20/2020   Medtronic Reveal Linq model LNQ 22 ([place] [id number]) implantable loop recorder by Dr Kelsie   Allergies  Allergen Reactions   Actos [Pioglitazone] Swelling    Swelling all over body and moonface   Lisinopril Swelling    Swelling, may have had some breathing involvement. Angioedema   Nsaids Other (See Comments)    Patient reports internal bleeding. Experienced with tolmetin.    Penicillins Shortness Of Breath and Swelling    Arm Swelling with Penicillin (Occurred in 1960s) Breathing - throat swelling with Amoxicillin (Occurred prior to 2002)   Sulfamethoxazole Hives and Itching    welps all over immediately after dose   Flecainide  Other (See Comments)    Blurry  vision   Hctz [Hydrochlorothiazide ] Other (See Comments)    Gout   Iodinated Contrast Media Itching    Pt. Developed mild itching after receiving IV  cm; pt. Held; Dr. Garner recomended she take 50 mg of benadryl  when she goes home-if necessary; Dr Tarry recommends benadryl  prior to future exams requiring contrast media, but stated other doctors may recommend another premedication prep.   Prednisone  Other (See Comments)    Agitation, vivid dreams, anxiety does not want to take again.    Statins Other (See Comments)    Muscle aches with multiple statins. Able to tolerate rosuvastatin .   Gabapentin  Other (See Comments)    Caused dysphoria    Losartan  Potassium Swelling    Lower extremity swelling   Zetia  [Ezetimibe ] Other (See Comments)    Cramps         10/15/2023   10:35 AM 07/15/2023    3:00 PM 07/02/2023   11:56 AM  Depression screen PHQ 2/9  Decreased Interest 0 0 0  Down, Depressed, Hopeless 0 0 0  PHQ - 2 Score 0 0 0  Altered sleeping 0 0 0  Tired, decreased energy 0 0 0  Change in appetite 0 0 0  Feeling bad or failure about yourself  0 0 0  Trouble concentrating 0 0 0  Moving slowly or fidgety/restless 0 0 0  Suicidal thoughts 0 0 0  PHQ-9 Score 0 0 0  Difficult doing work/chores Not difficult at all Not difficult at all Not difficult at all       10/15/2023   10:36 AM 07/15/2023    3:00 PM 06/28/2023    2:56 PM 05/15/2023   10:08 AM  GAD 7 : Generalized Anxiety Score  Nervous, Anxious, on Edge 0 0 0 0  Control/stop worrying 0 0 0 0  Worry too much - different things 0 0 0 0  Trouble relaxing 0 0 0 0  Restless 0 0 0 0  Easily annoyed or irritable 0 0 0 0  Afraid - awful might happen 0 0 0 0  Total GAD 7 Score 0 0 0 0  Anxiety Difficulty Not difficult at all Not difficult at all Not difficult at all Not difficult at all      Review of Systems  Constitutional:  Negative for chills and fever.  Respiratory:  Negative for shortness of breath.   Cardiovascular:  Negative for chest pain.  Gastrointestinal:  Negative for abdominal pain, constipation, diarrhea, heartburn, nausea and vomiting.  Genitourinary:   Negative for dysuria, frequency and urgency.  Neurological:  Negative for dizziness and headaches.  Endo/Heme/Allergies:  Negative for polydipsia.  Psychiatric/Behavioral:  Negative for depression and suicidal ideas.  The patient is not nervous/anxious.       Objective:     BP 124/68   Pulse 67   Temp 98 F (36.7 C) (Oral)   Ht 5' 5 (1.651 m)   Wt 160 lb (72.6 kg)   SpO2 98%   BMI 26.63 kg/m  BP Readings from Last 3 Encounters:  11/29/23 124/68  11/26/23 (!) 144/87  11/22/23 (!) 150/90   Wt Readings from Last 3 Encounters:  11/29/23 160 lb (72.6 kg)  11/22/23 163 lb (73.9 kg)  10/16/23 167 lb 9.6 oz (76 kg)      Physical Exam Vitals and nursing note reviewed.  Constitutional:      Appearance: Normal appearance.  Cardiovascular:     Rate and Rhythm: Normal rate and regular rhythm.     Pulses: Normal pulses.     Heart sounds: Normal heart sounds.  Pulmonary:     Effort: Pulmonary effort is normal.     Breath sounds: Normal breath sounds.  Neurological:     Mental Status: She is alert and oriented to person, place, and time.  Psychiatric:        Mood and Affect: Mood normal.        Behavior: Behavior normal.        Thought Content: Thought content normal.        Judgment: Judgment normal.      No results found for any visits on 11/29/23.     The ASCVD Risk score (Arnett DK, et al., 2019) failed to calculate for the following reasons:   The 2019 ASCVD risk score is only valid for ages 42 to 74    Assessment & Plan:  Hospital discharge follow-up Assessment & Plan: Reviewed hospital notes, labs and imaging.   Polycystic kidney disease  Fibroid Assessment & Plan: - Schedule appointment with OB GYN for further evaluation.      Assessment and Plan Assessment & Plan chronic kidney disease and polycystic kidney disease Chronic kidney disease stable with creatinine and BUN at baseline. Polycystic kidney disease confirmed on CT, stable kidney  function. - Follow up with nephrology annually. Ensure nephrologist faxes records to primary care.  Urinary tract infection with urinary frequency and urgency Urinary frequency and urgency persist. Urinalysis inconclusive for UTI. Keflex  prescribed in ER without adverse reaction despite potential penicillin allergy. Referral to OB GYN for further evaluation. - Take Keflex  500 mg, two tablets twice daily with food for 7 days. - Follow up with urologist to discuss CT scan results and further management.   Return if symptoms worsen or fail to improve.    Carrol Aurora, NP

## 2023-11-29 NOTE — Assessment & Plan Note (Addendum)
-   Schedule appointment with OB GYN for further evaluation.

## 2023-11-29 NOTE — Assessment & Plan Note (Signed)
 Reviewed hospital notes, labs and imaging

## 2023-11-29 NOTE — Patient Instructions (Addendum)
 Call and schedule appointment Harlene Macario Cisco, CNM [facility] at [facility] [address] [place], [place] [address] [phone]  Call your urologist and let them know you had the CT scan. Dr. Glendia Barba.  Call and follow up with nephrologist (kidney specialist) for your yearly visit.   Continue antibiotics for the 7 days.   Follow up as scheduled.   It was a pleasure to see you today!

## 2023-12-02 ENCOUNTER — Encounter: Payer: Self-pay | Admitting: Pharmacist

## 2023-12-02 ENCOUNTER — Telehealth: Payer: Self-pay

## 2023-12-02 NOTE — Telephone Encounter (Signed)
 Gave pt a call pt is coming up due for reenrollemnt on AZ&ME Farxiga  spoke with pt explain she is no longer taking this medication. No need to do PAP.

## 2023-12-04 ENCOUNTER — Other Ambulatory Visit: Payer: Self-pay | Admitting: General Practice

## 2023-12-04 DIAGNOSIS — K2100 Gastro-esophageal reflux disease with esophagitis, without bleeding: Secondary | ICD-10-CM

## 2023-12-05 NOTE — Progress Notes (Signed)
 Remote Loop Recorder Transmission

## 2023-12-10 ENCOUNTER — Other Ambulatory Visit

## 2023-12-11 ENCOUNTER — Ambulatory Visit: Admitting: Podiatry

## 2023-12-11 DIAGNOSIS — M21622 Bunionette of left foot: Secondary | ICD-10-CM

## 2023-12-12 ENCOUNTER — Encounter

## 2023-12-13 ENCOUNTER — Ambulatory Visit

## 2023-12-13 DIAGNOSIS — I480 Paroxysmal atrial fibrillation: Secondary | ICD-10-CM | POA: Diagnosis not present

## 2023-12-14 NOTE — Assessment & Plan Note (Signed)
 Acute, most likely secondary to BPPV.  She does have some evidence of eustachian tube dysfunction.  Will check labs to rule out secondary causes.  Start Flonase  2 spray per nostril daily.  Start home balance retraining exercises.  Can use meclizine  every 8 hours as needed but this can make you sleepy. Call if not improving as expected.

## 2023-12-15 LAB — CUP PACEART REMOTE DEVICE CHECK
Date Time Interrogation Session: 20251016231347
Implantable Pulse Generator Implant Date: 20220525

## 2023-12-17 NOTE — Progress Notes (Signed)
 Remote Loop Recorder Transmission

## 2023-12-23 ENCOUNTER — Encounter

## 2023-12-23 ENCOUNTER — Ambulatory Visit: Payer: Self-pay | Admitting: Cardiovascular Disease

## 2023-12-30 ENCOUNTER — Ambulatory Visit: Admitting: General Practice

## 2024-01-06 ENCOUNTER — Encounter: Payer: Self-pay | Admitting: Cardiology

## 2024-01-06 ENCOUNTER — Ambulatory Visit: Attending: Cardiology | Admitting: Cardiology

## 2024-01-06 VITALS — BP 128/70 | HR 67 | Ht 65.0 in | Wt 163.8 lb

## 2024-01-06 DIAGNOSIS — I10 Essential (primary) hypertension: Secondary | ICD-10-CM

## 2024-01-06 DIAGNOSIS — I480 Paroxysmal atrial fibrillation: Secondary | ICD-10-CM

## 2024-01-06 DIAGNOSIS — E782 Mixed hyperlipidemia: Secondary | ICD-10-CM | POA: Diagnosis not present

## 2024-01-06 NOTE — Patient Instructions (Signed)
 Medication Instructions:  Your physician recommends that you continue on your current medications as directed. Please refer to the Current Medication list given to you today.   *If you need a refill on your cardiac medications before your next appointment, please call your pharmacy*  Lab Work: No labs ordered today  If you have labs (blood work) drawn today and your tests are completely normal, you will receive your results only by: MyChart Message (if you have MyChart) OR A paper copy in the mail If you have any lab test that is abnormal or we need to change your treatment, we will call you to review the results.  Testing/Procedures: No test ordered today   Follow-Up: At [facility], you and your health needs are our priority.  As part of our continuing mission to provide you with exceptional heart care, our providers are all part of one team.  This team includes your primary Cardiologist (physician) and Advanced Practice Providers or APPs (Physician Assistants and Nurse Practitioners) who all work together to provide you with the care you need, when you need it.  Your next appointment:   1 year(s)  Provider:   You may see Dr. Darliss or one of the following Advanced Practice Providers on your designated Care Team:   Lonni Meager, NP Lesley Maffucci, PA-C Bernardino Bring, PA-C Cadence Le Roy, PA-C Tylene Lunch, NP Barnie Hila, NP    We recommend signing up for the patient portal called MyChart.  Sign up information is provided on this After Visit Summary.  MyChart is used to connect with patients for Virtual Visits (Telemedicine).  Patients are able to view lab/test results, encounter notes, upcoming appointments, etc.  Non-urgent messages can be sent to your provider as well.   To learn more about what you can do with MyChart, go to [email or web address].

## 2024-01-06 NOTE — Progress Notes (Signed)
 Cardiology Office Note:    Date:  01/06/2024   ID:  Courtney Pena, DOB August 20, 1940, MRN [id number]  PCP:  Vincente Shivers, NP   [facility] Providers Cardiologist:  None Electrophysiologist:  Will Gladis Norton, MD     Referring MD: Vincente Shivers, NP   Chief Complaint  Patient presents with   Follow-up    2 month follow up visit. Patient is doing well on today. Meds reviewed.     History of Present Illness:    Courtney Pena is a 83 y.o. female with a hx of hypertension, hyperlipidemia, diabetes, A-fib/flutter on Eliquis  who presents for follow-up.  Doing okay, denies chest pain or shortness of breath.  Denies any bleeding issues with Eliquis .  Ophthalmic medications as prescribed.  Feels well, has no concerns at this time.  Prior notes/testing Cardiac PET/2024 normal perfusion with no significant ischemia. Echo 12/2021 EF 65 to 70% Stopped flecainide  due to blurred vision.  Deferred ablation or propafenone. Previously did not tolerate Xarelto  or warfarin.  Past Medical History:  Diagnosis Date   Adenomatous polyp of colon 10/23/2021   Last colonoscopy was 02/2018 recommended 3 year follow up.       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  Did not tolerate zaralto for PAF.  Seems to tolerate ASA.Courtney Pena  Likely needs long term iron  to keep up with ongoing loss.     Arthritis    Atrial flutter (HCC) 12/07/2010   converted in ED with 300 mg flecainide    CAD (coronary artery disease)    a. mild per cath in 2004;  b. nonischemic Myoview  in March 2012;  c. Lex MV 1/14:  EF 66%, no ischemia   Cataract    Cold intolerance of hand 12/18/2021   Degenerative arthritis of thumb 02/12/2014   Disequilibrium 04/25/2006   Formatting of this note might be different from the original. Formatting of  this note might be different from the original. Qualifier: Diagnosis of By: Scarlet MD, Elsie Last Assessment & Plan: Formatting of this note might be different from the original. Now describing ear fullness more on left side.  Perhaps the removal of cerumen impaction will help.  No further WU given previously seen by ENT   Diverticulosis of colon 04/25/2006   Qualifier: Diagnosis of   By: Scarlet MD, Elsie      Replacing diagnoses that were inactivated after the 05/28/22 regulatory import     DJD, UNSPECIFIED 04/25/2006   Qualifier: Diagnosis of   By: Scarlet MD, Elsie      Replacing diagnoses that were inactivated after the 05/28/22 regulatory import     Dysrhythmia    A-Fib. 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     Hyperlipidemia    Hypertension    Hypothyroidism    Neuromuscular disorder (HCC)    neuropathy in feet   Obesity    Personal history of colonic polyps 06/06/2009   cecal polyp  Rheumatoid arthritis (HCC)    Shingles 2023   Sleep apnea    lost weight no longer needs   Type 2 diabetes mellitus with diabetic chronic kidney disease (HCC) 04/25/2006        Past Surgical History:  Procedure Laterality Date   ARTERY BIOPSY N/A 08/31/2021   Procedure: LEFT TEMPORAL ARTERY BIOPSY;  Surgeon: Vernetta Berg, MD;  Location: [facility];  Service: General;  Laterality: N/A;   BREAST BIOPSY Left    BREAST EXCISIONAL BIOPSY Left    benign   CARDIAC CATHETERIZATION  1999&2004   CARPAL TUNNEL RELEASE Bilateral 2003   CATARACT EXTRACTION Bilateral 1990   CHOLECYSTECTOMY N/A 07/05/2021   Procedure: LAPAROSCOPIC CHOLECYSTECTOMY;  Surgeon:  Lyndel Deward PARAS, MD;  Location: [facility];  Service: General;  Laterality: N/A;   COLONOSCOPY     ESOPHAGOGASTRODUODENOSCOPY  08/13/2011   Procedure: ESOPHAGOGASTRODUODENOSCOPY (EGD);  Surgeon: Lupita FORBES Commander, MD;  Location: [facility];  Service: Endoscopy;  Laterality: N/A;   implantable loop recorder placement  07/20/2020   Medtronic Reveal Linq model LNQ 22 ([place] [id number]) implantable loop recorder by Dr Kelsie    Current Medications: Current Meds  Medication Sig   allopurinol  (ZYLOPRIM ) 100 MG tablet Take 1 tablet (100 mg total) by mouth daily.   apixaban  (ELIQUIS ) 5 MG TABS tablet Take 1 tablet (5 mg total) by mouth 2 (two) times daily.   Apoaequorin (PREVAGEN PO) Take 1 tablet by mouth daily.   blood glucose meter kit and supplies Dispense based on patient and insurance preference. Use up to four times daily as directed. (FOR ICD-10 E10.9, E11.9).   Blood Glucose Monitoring Suppl (ONE TOUCH ULTRA 2) w/Device KIT 1 each by Does not apply route daily. Use glucometer to check blood sugar 3 times daily. E11.22   carvedilol  (COREG ) 12.5 MG tablet Take 1 tablet (12.5 mg total) by mouth 2 (two) times daily.   Coenzyme Q10 (COQ10) 100 MG CAPS Take 100 mg by mouth daily.   diclofenac  Sodium (VOLTAREN ) 1 % GEL Apply 4 g topically 4 (four) times daily.   diltiazem  (CARDIZEM  CD) 360 MG 24 hr capsule Take 1 capsule (360 mg total) by mouth daily.   diltiazem  (CARDIZEM ) 30 MG tablet One tab by mouth every six hours as needed for atrial fib   fluticasone  (FLONASE ) 50 MCG/ACT nasal spray Place 2 sprays into both nostrils daily.   glucose blood (ONETOUCH ULTRA) test strip 1 Container by Other route every morning.   glucose blood (ONETOUCH ULTRA) test strip Use to check blood sugar up to three times daily as instructed. E11.22   hydrOXYzine  (ATARAX ) 10 MG tablet Take 1 tablet (10 mg total) by mouth 2 (two) times daily as needed for itching.   Insulin  Pen Needle 31G X 8 MM MISC Use with pen to inject  once daily.   Lancets (ONETOUCH ULTRASOFT) lancets Use to check blood sugar up to 3 times daily. E11.22   levothyroxine  (SYNTHROID ) 125 MCG tablet Take 1 tablet (125 mcg total) by mouth daily.   meclizine  (ANTIVERT ) 25 MG tablet TAKE 1 TABLET (25 MG TOTAL) BY MOUTH AS NEEDED.   metFORMIN  (GLUCOPHAGE ) 500 MG tablet Take by mouth daily with breakfast.   Multiple Vitamin (MULTI-VITAMINS) TABS Take 1 tablet by mouth daily.   nitroGLYCERIN  (NITROSTAT ) 0.4 MG SL tablet Place 1 tablet (0.4 mg total) under the tongue every 5 (five) minutes as needed for chest pain (Call 911 if chest pain after three doses).   omeprazole  (PRILOSEC) 40 MG  capsule TAKE 1 CAPSULE (40 MG TOTAL) BY MOUTH DAILY.   rosuvastatin  (CRESTOR ) 10 MG tablet Take 1 tablet (10 mg total) by mouth daily.   Semaglutide , 1 MG/DOSE, 4 MG/3ML SOPN Inject 1 mg as directed once a week.   VITAMIN D , CHOLECALCIFEROL , PO Take by mouth. 4000mg      Allergies:   Actos [pioglitazone], Lisinopril, Nsaids, Penicillins, Sulfamethoxazole, Flecainide , Hctz [hydrochlorothiazide ], Iodinated contrast media, Prednisone , Statins, Gabapentin , Losartan  potassium, and Zetia  [ezetimibe ]   Social History   Socioeconomic History   Marital status: Widowed    Spouse name: Zachary   Number of children: 4   Years of education: some colle   Highest education level: Not on file  Occupational History   Occupation: Management    Employer: UNEMPLOYED  Tobacco Use   Smoking status: Former    Current packs/day: 0.00    Average packs/day: 2.0 packs/day for 30.0 years (60.0 ttl pk-yrs)    Types: Cigarettes    Start date: 02/27/1963    Quit date: 02/26/1993    Years since quitting: 30.8    Passive exposure: Past   Smokeless tobacco: Never   Tobacco comments:    Former smoker 03/21/22  Vaping Use   Vaping status: Never Used  Substance and Sexual Activity   Alcohol use: No    Alcohol/week: 0.0 standard drinks of alcohol   Drug use: No   Sexual activity: Not  Currently  Other Topics Concern   Not on file  Social History Narrative   Not on file   Social Drivers of Health   Financial Resource Strain: Low Risk  (07/02/2023)   Overall Financial Resource Strain (CARDIA)    Difficulty of Paying Living Expenses: Not hard at all  Food Insecurity: No Food Insecurity (07/02/2023)   Hunger Vital Sign    Worried About Running Out of Food in the Last Year: Never true    Ran Out of Food in the Last Year: Never true  Transportation Needs: No Transportation Needs (07/02/2023)   PRAPARE - Administrator, Civil Service (Medical): No    Lack of Transportation (Non-Medical): No  Physical Activity: Sufficiently Active (07/02/2023)   Exercise Vital Sign    Days of Exercise per Week: 7 days    Minutes of Exercise per Session: 40 min  Stress: No Stress Concern Present (07/02/2023)   [employer] of Occupational Health - Occupational Stress Questionnaire    Feeling of Stress : Not at all  Social Connections: Socially Integrated (07/02/2023)   Social Connection and Isolation Panel    Frequency of Communication with Friends and Family: More than three times a week    Frequency of Social Gatherings with Friends and Family: More than three times a week    Attends Religious Services: More than 4 times per year    Active Member of [employer] or Organizations: Yes    Attends Engineer, Structural: More than 4 times per year    Marital Status: Married     Family History: The patient's family history includes Colon polyps in her daughter; Diabetes in her daughter and maternal grandfather; Heart disease in her father; Hypertension in her mother; Other in her mother; Thyroid  disease in her daughter. There is no history of Colon cancer, Esophageal cancer, Stomach cancer, or Rectal cancer.  ROS:   Please see the history of present illness.     All other systems reviewed and are negative.  EKGs/Labs/Other Studies Reviewed:    The following studies were  reviewed  today:       Recent Labs: 11/22/2023: TSH 3.67 11/26/2023: ALT 11; BUN 25; Creatinine, Ser 1.26; Hemoglobin 10.4; Platelets 211; Potassium 3.9; Sodium 138  Recent Lipid Panel    Component Value Date/Time   CHOL 165 10/15/2023 1038   CHOL 109 02/22/2022 1102   TRIG 46.0 10/15/2023 1038   HDL 85.60 10/15/2023 1038   HDL 61 02/22/2022 1102   CHOLHDL 2 10/15/2023 1038   VLDL 9.2 10/15/2023 1038   LDLCALC 70 10/15/2023 1038   LDLCALC 29 02/22/2022 1102   LDLDIRECT 62 11/24/2018 1211   LDLDIRECT 178 (H) 05/12/2015 1229     Risk Assessment/Calculations:            Physical Exam:    VS:  BP 128/70   Pulse 67   Ht 5' 5 (1.651 m)   Wt 163 lb 12.8 oz (74.3 kg)   SpO2 96%   BMI 27.26 kg/m     Wt Readings from Last 3 Encounters:  01/06/24 163 lb 12.8 oz (74.3 kg)  11/29/23 160 lb (72.6 kg)  11/22/23 163 lb (73.9 kg)     GEN:  Well nourished, well developed in no acute distress HEENT: Normal NECK: No JVD; No carotid bruits CARDIAC: RRR, no murmurs, rubs, gallops RESPIRATORY:  Clear to auscultation without rales, wheezing or rhonchi  ABDOMEN: Soft, non-tender, non-distended MUSCULOSKELETAL:  No edema; No deformity  SKIN: Warm and dry NEUROLOGIC:  Alert and oriented x 3 PSYCHIATRIC:  Normal affect   ASSESSMENT:    1. Paroxysmal atrial fibrillation (HCC)   2. Primary hypertension   3. Mixed hyperlipidemia     PLAN:    In order of problems listed above:  Paroxysmal atrial fibrillation, has ILR in place.  Regular rhythm on exam, indicating sinus.  Continue Coreg  12.5 mg twice daily, diltiazem  360 mg daily, Eliquis  5 mg twice daily.  Samples of Eliquis  were given.  Echo 5/25 EF 60 to 65%, severely dilated LA.  Hypertension, blood pressure controlled. continue Coreg  12.5 mg twice daily, diltiazem  360 mg daily. Hyperlipidemia, Cholesterol controlled.  Continue Crestor  10 mg daily.  Follow-up in 12 months      Medication Adjustments/Labs and Tests  Ordered: Current medicines are reviewed at length with the patient today.  Concerns regarding medicines are outlined above.  No orders of the defined types were placed in this encounter.  No orders of the defined types were placed in this encounter.   Patient Instructions  Medication Instructions:  Your physician recommends that you continue on your current medications as directed. Please refer to the Current Medication list given to you today.   *If you need a refill on your cardiac medications before your next appointment, please call your pharmacy*  Lab Work: No labs ordered today  If you have labs (blood work) drawn today and your tests are completely normal, you will receive your results only by: MyChart Message (if you have MyChart) OR A paper copy in the mail If you have any lab test that is abnormal or we need to change your treatment, we will call you to review the results.  Testing/Procedures: No test ordered today   Follow-Up: At [facility], you and your health needs are our priority.  As part of our continuing mission to provide you with exceptional heart care, our providers are all part of one team.  This team includes your primary Cardiologist (physician) and Advanced Practice Providers or APPs (Physician Assistants and Nurse Practitioners) who all work together  to provide you with the care you need, when you need it.  Your next appointment:   1 year(s)  Provider:   You may see Dr Darliss or one of the following Advanced Practice Providers on your designated Care Team:   Lonni Meager, NP Lesley Maffucci, PA-C Bernardino Bring, PA-C Cadence Wentworth, PA-C Tylene Lunch, NP Barnie Hila, NP    We recommend signing up for the patient portal called MyChart.  Sign up information is provided on this After Visit Summary.  MyChart is used to connect with patients for Virtual Visits (Telemedicine).  Patients are able to view lab/test results, encounter notes,  upcoming appointments, etc.  Non-urgent messages can be sent to your provider as well.   To learn more about what you can do with MyChart, go to [email or web address].              Signed, Redell Darliss, MD  01/06/2024 11:48 AM    [facility]

## 2024-01-13 ENCOUNTER — Encounter

## 2024-01-14 ENCOUNTER — Other Ambulatory Visit: Payer: Self-pay

## 2024-01-15 ENCOUNTER — Other Ambulatory Visit: Payer: Self-pay

## 2024-01-15 MED ORDER — FLUZONE 0.5 ML IM SUSY
0.5000 mL | PREFILLED_SYRINGE | Freq: Once | INTRAMUSCULAR | 0 refills | Status: DC
  Filled 2024-01-15: qty 0.5, 1d supply, fill #0

## 2024-01-15 MED ORDER — FLUZONE HIGH-DOSE 0.5 ML IM SUSY
0.5000 mL | PREFILLED_SYRINGE | Freq: Once | INTRAMUSCULAR | 0 refills | Status: AC
  Filled 2024-01-15: qty 0.5, 1d supply, fill #0

## 2024-01-16 ENCOUNTER — Ambulatory Visit: Attending: Cardiovascular Disease

## 2024-01-16 DIAGNOSIS — I480 Paroxysmal atrial fibrillation: Secondary | ICD-10-CM

## 2024-01-16 LAB — CUP PACEART REMOTE DEVICE CHECK
Date Time Interrogation Session: 20251119230945
Implantable Pulse Generator Implant Date: 20220525

## 2024-01-20 NOTE — Progress Notes (Signed)
 Remote Loop Recorder Transmission

## 2024-01-21 ENCOUNTER — Ambulatory Visit (INDEPENDENT_AMBULATORY_CARE_PROVIDER_SITE_OTHER): Admitting: General Practice

## 2024-01-21 ENCOUNTER — Encounter: Payer: Self-pay | Admitting: General Practice

## 2024-01-21 VITALS — BP 136/78 | HR 76 | Temp 98.1°F | Ht 65.0 in | Wt 159.0 lb

## 2024-01-21 DIAGNOSIS — N1831 Chronic kidney disease, stage 3a: Secondary | ICD-10-CM

## 2024-01-21 DIAGNOSIS — E1169 Type 2 diabetes mellitus with other specified complication: Secondary | ICD-10-CM

## 2024-01-21 DIAGNOSIS — I480 Paroxysmal atrial fibrillation: Secondary | ICD-10-CM

## 2024-01-21 DIAGNOSIS — E1122 Type 2 diabetes mellitus with diabetic chronic kidney disease: Secondary | ICD-10-CM

## 2024-01-21 DIAGNOSIS — Z7984 Long term (current) use of oral hypoglycemic drugs: Secondary | ICD-10-CM

## 2024-01-21 DIAGNOSIS — I10 Essential (primary) hypertension: Secondary | ICD-10-CM

## 2024-01-21 DIAGNOSIS — E039 Hypothyroidism, unspecified: Secondary | ICD-10-CM

## 2024-01-21 DIAGNOSIS — E785 Hyperlipidemia, unspecified: Secondary | ICD-10-CM

## 2024-01-21 MED ORDER — PEN NEEDLES 31G X 6 MM MISC: 2 refills | Status: AC

## 2024-01-21 NOTE — Patient Instructions (Signed)
 Continue medications as prescribed.   Call lindsay about the ozempic  1 mg.   I have sent in the needles; let me know if they do not work.   Follow up in 3 months.   It was a pleasure to see you today!

## 2024-01-21 NOTE — Progress Notes (Signed)
 Established Patient Office Visit  Subjective   Patient ID: Courtney Pena, female    DOB: [birth year]  Age: 83 y.o. MRN: [id number]  Chief Complaint  Patient presents with   Diabetes    Patient here today to f/u on DM. Patient hasn't been checking BS as she should; checked last week and was in the 170s.     Diabetes Pertinent negatives for hypoglycemia include no dizziness, headaches or nervousness/anxiousness. Pertinent negatives for diabetes include no chest pain and no polydipsia.    ROSABEL Pena is a 83 year old female with past medical history HTN, afib, pulmonary nodule, DM2, hypothyroidism, RA, hypercholesteremia presents today for an acute visit to discuss blood sugars.   Discussed the use of AI scribe software for clinical note transcription with the patient, who gave verbal consent to proceed.  History of Present Illness Courtney Pena is an 83 year old female with diabetes and polycystic kidney disease who presents for medication management and follow-up.  She is experiencing issues with the needles provided for her Ozempic  injections, stating that they are too short and have broken during use.   She has been on Ozempic  1 mg once weekly for diabetes management. Recently, her prescription was increased to 2 mg, but she experienced an adverse reaction after the first dose and reverted to the 1 mg dose. Courtney Pena Her last A1c in August was 7.4, down from 7.8 in March.  She has a history of polycystic kidney disease and is scheduled to see a kidney specialist in January. She reports a persistent sensation of pulling and tightening on her right side, which began after a hiatal hernia repair concurrent with gallbladder removal. A recent CT scan showed fibroids, arthritis, kidney disease, a hernia, and diverticulosis, but no acute findings related to her symptoms.  She has a history of AFib and is attending an AFib clinic. She is on Eliquis  for  anticoagulation and has previously tried warfarin, which was not well-tolerated. She is also on carvedilol  and diltiazem  for blood pressure and heart rate control.    Patient Active Problem List   Diagnosis Date Noted   Fibroid 11/29/2023   Hospital discharge follow-up 11/29/2023   Bunionette of left foot 10/15/2023   Pruritus 07/15/2023   Itchy eyes 01/02/2023   History of gout 08/28/2022   Orthostatic hypotension 08/02/2022   Dizziness 07/02/2022   Subacute cough 07/02/2022   Right foot drop 05/07/2022   Tenosynovitis of left foot 10/19/2020   Hypercoagulable state due to paroxysmal atrial fibrillation (HCC) 10/03/2020   Rheumatoid arthritis (HCC) 03/21/2020   Tinnitus 03/21/2020   Idiopathic chronic gout of multiple sites without tophus 12/09/2019   Lumbar back pain with radiculopathy affecting left lower extremity 07/30/2019   Rotator cuff syndrome of right shoulder 11/24/2018   Hypertensive retinopathy of both eyes 10/14/2018   Vitreomacular adhesion of left eye 10/14/2018   Diabetic retinopathy, nonproliferative, moderate (HCC) 03/07/2018   Chronic diastolic CHF (congestive heart failure) (HCC) 08/16/2017   Sudden hearing loss, left 09/25/2016   Chronic anticoag - Xarelto  05//2017, CHADS2CVASC=5 Eliquis  9/20 07/07/2015   Vitamin D  deficiency 09/18/2013   Hyperlipidemia associated with type 2 diabetes mellitus (HCC) 08/22/2012   Pseudophakia, both eyes 03/12/2012   Presence of intraocular lens 03/12/2012   Pulmonary nodule 12/26/2010   Paroxysmal atrial fibrillation (HCC) 12/14/2010   Urinary frequency 07/13/2010   Hypothyroidism 04/25/2006   Type 2 diabetes mellitus with diabetic chronic kidney disease (HCC) 04/25/2006   HYPERCHOLESTEROLEMIA 04/25/2006   HYPERTENSION, BENIGN  SYSTEMIC 04/25/2006   Coronary atherosclerosis 04/25/2006   Reflux esophagitis 04/25/2006   VERTIGO NOS OR DIZZINESS 04/25/2006   Past Medical History:  Diagnosis Date   Adenomatous polyp of  colon 10/23/2021   Last colonoscopy was 02/2018 recommended 3 year follow up.       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  Did not tolerate zaralto for PAF.  Seems to tolerate ASA.Courtney Pena  Likely needs long term iron  to keep up with ongoing loss.     Arthritis    Atrial flutter (HCC) 12/07/2010   converted in ED with 300 mg flecainide    CAD (coronary artery disease)    a. mild per cath in 2004;  b. nonischemic Myoview  in March 2012;  c. Lex MV 1/14:  EF 66%, no ischemia   Cataract    Cold intolerance of hand 12/18/2021   Degenerative arthritis of thumb 02/12/2014   Disequilibrium 04/25/2006   Formatting of this note might be different from the original. Formatting of this note might be different from the original. Qualifier: Diagnosis of By: Scarlet MD, Elsie Last Assessment & Plan: Formatting of this note might be different from the original. Now describing ear fullness more on left side.  Perhaps the removal of cerumen impaction will help.  No further WU given previously seen by ENT   Diverticulosis of colon 04/25/2006   Qualifier: Diagnosis of   By: Scarlet MD, Elsie      Replacing diagnoses that were inactivated after the 05/28/22 regulatory import     DJD, UNSPECIFIED 04/25/2006   Qualifier: Diagnosis of   By: Scarlet MD, Elsie      Replacing diagnoses that were inactivated after the 05/28/22 regulatory import     Dysrhythmia    A-Fib. 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      Hyperlipidemia    Hypertension    Hypothyroidism    Neuromuscular disorder (HCC)    neuropathy in feet   Obesity    Personal history of colonic polyps 06/06/2009   cecal polyp   Rheumatoid arthritis (HCC)    Shingles 2023   Sleep apnea    lost weight no longer needs   Type 2 diabetes mellitus with diabetic chronic kidney disease (HCC) 04/25/2006       Past Surgical History:  Procedure Laterality Date   ARTERY BIOPSY N/A 08/31/2021   Procedure: LEFT TEMPORAL ARTERY BIOPSY;  Surgeon: Vernetta Berg, MD;  Location: [facility];  Service: General;  Laterality: N/A;   BREAST BIOPSY Left    BREAST EXCISIONAL BIOPSY Left    benign   CARDIAC CATHETERIZATION  1999&2004   CARPAL TUNNEL RELEASE Bilateral 2003   CATARACT EXTRACTION Bilateral 1990   CHOLECYSTECTOMY N/A 07/05/2021   Procedure: LAPAROSCOPIC CHOLECYSTECTOMY;  Surgeon: Lyndel Deward PARAS, MD;  Location: [facility];  Service: General;  Laterality: N/A;   COLONOSCOPY     ESOPHAGOGASTRODUODENOSCOPY  08/13/2011   Procedure: ESOPHAGOGASTRODUODENOSCOPY (EGD);  Surgeon: Lupita  FORBES Commander, MD;  Location: [facility];  Service: Endoscopy;  Laterality: N/A;   implantable loop recorder placement  07/20/2020   Medtronic Reveal Linq model LNQ 22 ([place] [id number]) implantable loop recorder by Dr Kelsie   Allergies  Allergen Reactions   Actos [Pioglitazone] Swelling    Swelling all over body and moonface   Lisinopril Swelling    Swelling, may have had some breathing involvement. Angioedema   Nsaids Other (See Comments)    Patient reports internal bleeding. Experienced with tolmetin.    Penicillins Shortness Of Breath and Swelling    Arm Swelling with Penicillin (Occurred in 1960s) Breathing - throat swelling with Amoxicillin (Occurred prior to 2002)   Sulfamethoxazole Hives and Itching    welps all over immediately after dose   Flecainide  Other (See Comments)    Blurry  vision   Hctz [Hydrochlorothiazide ] Other (See Comments)    Gout    Iodinated Contrast Media Itching    Pt. Developed mild itching after receiving IV cm; pt. Held; Dr. Garner recomended she take 50 mg of benadryl  when she goes home-if necessary; Dr Tarry recommends benadryl  prior to future exams requiring contrast media, but stated other doctors may recommend another premedication prep.   Prednisone  Other (See Comments)    Agitation, vivid dreams, anxiety does not want to take again.    Statins Other (See Comments)    Muscle aches with multiple statins. Able to tolerate rosuvastatin .   Gabapentin  Other (See Comments)    Caused dysphoria    Losartan  Potassium Swelling    Lower extremity swelling   Zetia  [Ezetimibe ] Other (See Comments)    Cramps         01/21/2024    3:25 PM 10/15/2023   10:35 AM 07/15/2023    3:00 PM  Depression screen PHQ 2/9  Decreased Interest 0 0 0  Down, Depressed, Hopeless 0 0 0  PHQ - 2 Score 0 0 0  Altered sleeping 0 0 0  Tired, decreased energy 0 0 0  Change in appetite 0 0 0  Feeling bad or failure about yourself  0 0 0  Trouble concentrating 0 0 0  Moving slowly or fidgety/restless 0 0 0  Suicidal thoughts 0 0 0  PHQ-9 Score 0 0  0   Difficult doing work/chores Not difficult at all Not difficult at all Not difficult at all     Data saved with a previous flowsheet row definition       01/21/2024    3:25 PM 10/15/2023   10:36 AM 07/15/2023    3:00 PM 06/28/2023    2:56 PM  GAD 7 : Generalized Anxiety Score  Nervous, Anxious, on Edge 0 0 0 0  Control/stop worrying 0 0 0 0  Worry too much - different things 0 0 0 0  Trouble relaxing 0 0 0 0  Restless 0 0 0 0  Easily annoyed or irritable 0 0 0 0  Afraid - awful might happen 0 0 0 0  Total GAD 7 Score 0 0 0 0  Anxiety Difficulty Not difficult at all Not difficult at all Not difficult at all Not difficult at all      Review of Systems  Constitutional:  Negative for chills and fever.  Respiratory:  Negative for shortness of breath.   Cardiovascular:   Negative for chest pain.  Gastrointestinal:  Negative for abdominal pain, constipation, diarrhea, heartburn, nausea and vomiting.  Genitourinary:  Negative for dysuria, frequency and urgency.  Neurological:  Negative for dizziness and headaches.  Endo/Heme/Allergies:  Negative for polydipsia.  Psychiatric/Behavioral:  Negative for depression and suicidal ideas. The patient is not nervous/anxious.       Objective:     BP 136/78   Pulse 76   Temp 98.1 F (36.7 C) (Oral)   Ht 5' 5 (1.651 m)   Wt 159 lb (72.1 kg)   SpO2 98%   BMI 26.46 kg/m  BP Readings from Last 3 Encounters:  01/21/24 136/78  01/06/24 128/70  11/29/23 124/68   Wt Readings from Last 3 Encounters:  01/21/24 159 lb (72.1 kg)  01/06/24 163 lb 12.8 oz (74.3 kg)  11/29/23 160 lb (72.6 kg)      Physical Exam Vitals and nursing note reviewed.  Constitutional:      Appearance: Normal appearance.  Cardiovascular:     Rate and Rhythm: Normal rate and regular rhythm.     Pulses: Normal pulses.     Heart sounds: Normal heart sounds.  Pulmonary:     Effort: Pulmonary effort is normal.     Breath sounds: Normal breath sounds.  Neurological:     Mental Status: She is alert and oriented to person, place, and time.  Psychiatric:        Mood and Affect: Mood normal.        Behavior: Behavior normal.        Thought Content: Thought content normal.        Judgment: Judgment normal.      No results found for any visits on 01/21/24.     The ASCVD Risk score (Arnett DK, et al., 2019) failed to calculate for the following reasons:   The 2019 ASCVD risk score is only valid for ages 70 to 77    Assessment & Plan:  Type 2 diabetes mellitus with chronic kidney disease, without long-term current use of insulin , unspecified CKD stage (HCC) -     Pen Needles; Use nightly with insulin .  Dispense: 100 each; Refill: 2  Type 2 diabetes mellitus with stage 3a chronic kidney disease, without long-term current use of  insulin  (HCC)  HYPERTENSION, BENIGN SYSTEMIC  Paroxysmal atrial fibrillation (HCC)  Hypothyroidism, unspecified type  Hyperlipidemia associated with type 2 diabetes mellitus (HCC)    Assessment and Plan Assessment & Plan Type 2 diabetes mellitus on Ozempic  and metformin  Type 2 diabetes mellitus managed with Ozempic  and metformin . A1c improved from 7.8% to 7.4%. Adverse reaction to increased Ozempic  dose, reverted to 1 mg. Needle size concern due to breakage. - Continue Ozempic  1 mg once weekly. - Sent prescription for 6 mm needles for Ozempic  administration. - Continue metformin  500 mg once daily.  Chronic kidney disease and polycystic kidney disease Chronic kidney disease and polycystic kidney disease. Right-sided pain possibly related to kidney issues. Previous CT scan showed fibroids, arthritis, kidney disease, hernia, and diverticulosis. - Ensure follow-up with nephrologist in January. - Ensure follow-up with urologist in January.  Atrial fibrillation and hypertension Atrial fibrillation managed with Eliquis . Hypertension managed with diltiazem  and carvedilol . Blood pressure well-controlled. Eliquis  cost expected to decrease after January. - Continue Eliquis  as prescribed. - Continue diltiazem  360 mg daily with additional dose if AFib event occurs. - Continue carvedilol  12.5 mg twice daily.  Hyperlipidemia Managed with Crestor . - Continue Crestor  as prescribed.  Hypothyroidism Managed with levothyroxine . - Continue levothyroxine  as prescribed.   Return in about 3 months (around 04/22/2024) for chronic care management.Courtney Pena Carrol [place], NP

## 2024-01-23 ENCOUNTER — Encounter

## 2024-01-27 ENCOUNTER — Ambulatory Visit: Payer: Self-pay | Admitting: Cardiovascular Disease

## 2024-02-10 ENCOUNTER — Telehealth: Payer: Self-pay | Admitting: Pharmacist

## 2024-02-10 ENCOUNTER — Other Ambulatory Visit (HOSPITAL_COMMUNITY): Payer: Self-pay

## 2024-02-10 DIAGNOSIS — E1122 Type 2 diabetes mellitus with diabetic chronic kidney disease: Secondary | ICD-10-CM

## 2024-02-10 MED ORDER — ONETOUCH ULTRA VI STRP: ORAL_STRIP | 11 refills | Status: AC

## 2024-02-10 MED ORDER — ONETOUCH ULTRASOFT LANCETS MISC: 11 refills | Status: AC

## 2024-02-10 NOTE — Addendum Note (Signed)
 Addended by: GERONIMO MANUELITA SAUNDERS on: 02/10/2024 04:22 PM   Modules accepted: Orders

## 2024-02-10 NOTE — Progress Notes (Signed)
 Brief Telephone Documentation Reason for Call: Patient left message regarding question for pharmacist   Summary of Call: Questions surrounding Ozempic  and test strips for her glucometer  Pen Needles Patient states she has run out of needles for Ozempic  (states the needles that come in the Ozempic  are too short and tend to bend so she runs out early. Instead, has been using pen needles from her late husbands stash which are longer though has run out of these as well). Notes she tried to purchase OTC needles and the pharmacy told her the cost would be $100  Stardarnd NovoFine needle = 4 mm.  Can try a 6mm pen needle at the pharmacy to see if this works for her.   Testing supplies Test strip refill needed. Patient confirms current glucometer = OneTouch. Will also sent lancet refill.   Ozempic  / Patient Assistance  Still has Ozempic  on hand and has been doing well with it. Reports 2 boxes left, one has been opened (just under 2 month supply remaining).  Reviewed Ozempic  program changes again with patient (no longer able to receive Ozempic  or Rybelsus  through PAP in 2026).   Insurance Patient notes she has elected to stay with the same Medicare plan for 2026  2025 Benefit: [email or web address] Preferred brand = $47/month ($420 deductible)     Plan:  Pen needles = Tier 3, expensive through insurance [facility] price = $10 for qty 50 [facility] = <$9 qty 100 Test strips and lancets sent for OneTouch meter (test claim in Epic shows $0, though for future reference, [employer] has preferred = Accu-chek, Contour) Will need to discuss alternative(s) to Ozempic  in the new year. Patient notes she has elected to go with the same Medicare health plan as this year (brand meds have all been unaffordable with this plan).  Can consider DPP4i Januvia  or Tradjenta in 2026 (tolerated Januvia  well in the past, though was switched to GLP1 for greater efficacy. Do not  expect comparable A1c benefit with current Ozempic  regimen though is an option through PAP if needed.  Pen needles showing as Tier 3 on [employer]. Test claims therefore are not $0. [facility] price through [facility] or local pharmacy may be cheaper than through insurance.   Follow Up: Patient given direct line for further questions/concerns. Phone visit ~1 month to review insurance coverage/Diabetes medication options in the new year   Future Appointments  Date Time Provider Department Center  02/16/2024  7:00 AM [facility] DEVICE REMOTES [facility]  03/02/2024 11:00 AM Stoioff, Glendia BROCKS, MD [facility] None  03/04/2024 10:30 AM [facility] PHARMACIST [facility] [address]  03/18/2024  7:10 AM [facility] DEVICE REMOTES [facility]  04/22/2024  2:00 PM Vincente Shivers, NP [facility] [address]    Courtney Pena, PharmD Clinical Pharmacist [facility] [phone]

## 2024-02-11 ENCOUNTER — Other Ambulatory Visit: Payer: Self-pay

## 2024-02-11 ENCOUNTER — Other Ambulatory Visit (HOSPITAL_COMMUNITY): Payer: Self-pay

## 2024-02-11 MED ORDER — INSUPEN SENSITIVE 32G X 6 MM MISC
1 refills | Status: AC
  Filled 2024-02-11: qty 100, 90d supply, fill #0

## 2024-02-13 ENCOUNTER — Encounter

## 2024-02-14 ENCOUNTER — Encounter: Payer: Self-pay | Admitting: Pharmacist

## 2024-02-14 NOTE — Progress Notes (Signed)
 Chart Review Reason: Medication Access  Summary: Previously discussed with patient: Ozempic  no longer offered through [facility] 2026  This week, [facility] Announced they will offer Trulicity  for new patients which is a reasonable alternative.  Discussed with patient it appears she did try Trulicity  in the past (~2018). Tolerated well though at the time felt it was not super effective. Reasonable to re-trial in the new new year given all other GLP1s are cost-prohibitive.    Considerations: [facility] 2026 application started online [phone])   Manuelita FABIENE Kobs, PharmD Clinical Pharmacist [facility] [phone]

## 2024-02-16 ENCOUNTER — Ambulatory Visit

## 2024-02-16 DIAGNOSIS — I480 Paroxysmal atrial fibrillation: Secondary | ICD-10-CM

## 2024-02-18 LAB — CUP PACEART REMOTE DEVICE CHECK
Date Time Interrogation Session: 20251220230631
Implantable Pulse Generator Implant Date: 20220525

## 2024-02-19 NOTE — Progress Notes (Signed)
 Remote Loop Recorder Transmission

## 2024-02-21 ENCOUNTER — Other Ambulatory Visit: Payer: Self-pay | Admitting: Family Medicine

## 2024-02-24 ENCOUNTER — Encounter

## 2024-02-25 ENCOUNTER — Ambulatory Visit: Payer: Self-pay

## 2024-02-25 NOTE — Telephone Encounter (Signed)
 Noted. Looks like patient is scheduled with me tomorrow. Will evaluate.

## 2024-02-25 NOTE — Telephone Encounter (Signed)
 FYI Only or Action Required?: FYI only for provider: UC advised .  Patient was last seen in primary care on 01/21/2024 by Vincente Shivers, NP.  Called Nurse Triage reporting Foot Swelling.  Symptoms began several days ago.  Interventions attempted: OTC medications: tylenol  .  Symptoms are: stable.  Triage Disposition: See HCP Within 4 Hours (Or PCP Triage)  Patient/caregiver understands and will follow disposition?: Yes                Reason for Disposition  [1] Looks infected (e.g., spreading redness, pus) AND [2] large red area (> 2 inches or 5 cm)  Answer Assessment - Initial Assessment Questions Swelling in left foot 4 days maybe 5 days. Doesn't seem to be going down. Hurts. Barely put shoes on . Has had before in the past doesn't remember this bad. No known injury. Did foot examination little warm left , and little red. One area top near toes and 1/2 way to middle of foot coloring red. Pain 6-7/10 mostly pressure applied and bearing weight but able to walk. Limping a little.  No appointment today in office or regional offices this RN advised urgent care . Patient agreeable [facility] [address]      1. ONSET: When did the pain start?      4-5 days ago no known injuy 2. LOCATION: Where is the pain located?      Left foot  3. PAIN: How bad is the pain?    (Scale 1-10; or mild, moderate, severe)     6-7/10 , increased with pressure applied   5. CAUSE: What do you think is causing the foot pain?     Patient  reports This has happened before but not as bad in the past . Type II Diabetes per chart  6. OTHER SYMPTOMS: Do you have any other symptoms? (e.g., leg pain, rash, fever, numbness)     Able to walk but limping a little. Thinks has issues with arthritis in arm. Whole foot is swollen , mostly the foot swollen big and puffy    Patient denies chest pain, shortness of breath. Fever  Protocols used: Foot Pain-A-AH  Copied from CRM  [id number]. Topic: Clinical - Red Word Triage >> Feb 25, 2024  8:43 AM Avram MATSU wrote: Red Word that prompted transfer to Nurse Triage: huge swelling/pain on left foot for 3-4 days. Difficult to walk and putting on shoes.

## 2024-02-26 ENCOUNTER — Encounter: Payer: Self-pay | Admitting: General Practice

## 2024-02-26 ENCOUNTER — Ambulatory Visit (HOSPITAL_COMMUNITY)
Admission: RE | Admit: 2024-02-26 | Discharge: 2024-02-26 | Disposition: A | Discharge status: Home / Self Care | Source: Ambulatory Visit | Attending: Vascular Surgery | Admitting: Vascular Surgery

## 2024-02-26 ENCOUNTER — Ambulatory Visit: Admitting: General Practice

## 2024-02-26 ENCOUNTER — Ambulatory Visit: Payer: Self-pay | Admitting: Cardiovascular Disease

## 2024-02-26 ENCOUNTER — Telehealth: Payer: Self-pay | Admitting: General Practice

## 2024-02-26 ENCOUNTER — Ambulatory Visit: Payer: Self-pay | Admitting: General Practice

## 2024-02-26 VITALS — BP 120/60 | HR 67 | Temp 97.7°F | Ht 65.0 in | Wt 169.8 lb

## 2024-02-26 DIAGNOSIS — M7989 Other specified soft tissue disorders: Secondary | ICD-10-CM | POA: Insufficient documentation

## 2024-02-26 DIAGNOSIS — M75101 Unspecified rotator cuff tear or rupture of right shoulder, not specified as traumatic: Secondary | ICD-10-CM

## 2024-02-26 NOTE — Telephone Encounter (Signed)
 Copied from CRM [id number]. Topic: Clinical - Medical Advice >> Feb 26, 2024 11:35 AM Anairis L wrote: Reason for CRM: Patient is calling in because she got the blot clot exam done and no blot clot was found.She would like a call back as soon as possible to see how she should move forward.

## 2024-02-26 NOTE — Patient Instructions (Addendum)
 Someone will call you to schedule the ultrasound hopefully for today or tomorrow.   In the meantime, continue compression stockings and warm compresses.   Take tylenol  arthritis 500 mg twice daily.  Use ice on the shoulder to help with the pain.  Let me know if you want to do physical therapy or shoulder injections by Dr. Watt after talking to rheumatology.   Call and let me know about the elquis refill; it looks like you have one waiting at the pharmacy.   Call and discuss ozempic  with Manuelita.   It was a pleasure to see you today!

## 2024-02-26 NOTE — Progress Notes (Signed)
 "  Established Patient Office Visit  Subjective   Patient ID: Courtney Pena, female    DOB: 1941-01-05  Age: 83 y.o. MRN: [id number]  Chief Complaint  Patient presents with   Edema    In left foot but is better today. Patient states yesterday it was so bad she could not put her shoe on. Patient states her foot was also very red.    Shoulder Pain    Down to her elbow pain. Patient states this has been going on x months but getting worse. Patient does have an appt with rheumatology on Friday. Patient stated that she has to lift her arm manually with her other arm in order to rise her arm.    Medication Refill    Patient wants to see if she can get her eliquis  refilled before the new year begins and she has to pay for it again. Patient states it very expensive.     Shoulder Pain  Pertinent negatives include no fever.  Medication Refill Pertinent negatives include no abdominal pain, chest pain, chills, fever, headaches, nausea or vomiting.   Discussed the use of AI scribe software for clinical note transcription with the patient, who gave verbal consent to proceed.  History of Present Illness Courtney Pena is an 83 year old female who presents with left foot and leg swelling.  She has significant swelling in her left foot, which began about two days before Christmas. The swelling was severe enough that she attempted to visit the emergency room but was unable to be seen due to high patient volume. The swelling has occurred in the past, though not frequently, and without any known injury or prolonged sitting. It was at its worst yesterday but has improved today. She applied Voltaren  to the area yesterday, which provided some relief. The swelling was initially present in the leg as well, but not as severely as in the foot, and it has not reached the knee.  She experiences pain in the foot, particularly when moving it up and down. The pain persists despite the reduction in  swelling. No recent trauma to the foot, such as dropping an object on it or stubbing it. She has a history of neuropathy, characterized by tingling in her feet, but distinguishes this from the current pain.  She is currently on a blood thinner, Eliquis , and has a history of being unable to take certain medications due to allergies, including prednisone , which causes her agitation and vivid dreams.  In addition to the foot issue, she reports right shoulder pain, which she has experienced before. The pain radiates down to her elbow and has been worsening over the past few months, making it difficult to lift her arm. She has previously received injections for this pain, which provided relief, and has undergone physical therapy. She is scheduled to see her rheumatologist on Friday for further evaluation.    Patient Active Problem List   Diagnosis Date Noted   Left leg swelling 02/26/2024   Fibroid 11/29/2023   Hospital discharge follow-up 11/29/2023   Bunionette of left foot 10/15/2023   Pruritus 07/15/2023   Itchy eyes 01/02/2023   History of gout 08/28/2022   Orthostatic hypotension 08/02/2022   Dizziness 07/02/2022   Subacute cough 07/02/2022   Right foot drop 05/07/2022   Tenosynovitis of left foot 10/19/2020   Hypercoagulable state due to paroxysmal atrial fibrillation (HCC) 10/03/2020   Rheumatoid arthritis (HCC) 03/21/2020   Tinnitus 03/21/2020   Idiopathic chronic gout of multiple  sites without tophus 12/09/2019   Lumbar back pain with radiculopathy affecting left lower extremity 07/30/2019   Rotator cuff syndrome of right shoulder 11/24/2018   Hypertensive retinopathy of both eyes 10/14/2018   Vitreomacular adhesion of left eye 10/14/2018   Diabetic retinopathy, nonproliferative, moderate (HCC) 03/07/2018   Chronic diastolic CHF (congestive heart failure) (HCC) 08/16/2017   Sudden hearing loss, left 09/25/2016   Chronic anticoag - Xarelto  05//2017, CHADS2CVASC=5 Eliquis  9/20  07/07/2015   Vitamin D  deficiency 09/18/2013   Hyperlipidemia associated with type 2 diabetes mellitus (HCC) 08/22/2012   Pseudophakia, both eyes 03/12/2012   Presence of intraocular lens 03/12/2012   Pulmonary nodule 12/26/2010   Paroxysmal atrial fibrillation (HCC) 12/14/2010   Urinary frequency 07/13/2010   Hypothyroidism 04/25/2006   Type 2 diabetes mellitus with diabetic chronic kidney disease (HCC) 04/25/2006   HYPERCHOLESTEROLEMIA 04/25/2006   HYPERTENSION, BENIGN SYSTEMIC 04/25/2006   Coronary atherosclerosis 04/25/2006   Reflux esophagitis 04/25/2006   VERTIGO NOS OR DIZZINESS 04/25/2006   Past Medical History:  Diagnosis Date   Adenomatous polyp of colon 10/23/2021   Last colonoscopy was 02/2018 recommended 3 year follow up.       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  Did not tolerate zaralto for PAF.  Seems to tolerate ASA.[name]  Likely needs long term iron  to keep up with ongoing loss.     Arthritis    Atrial flutter (HCC) 12/07/2010   converted in ED with 300 mg flecainide    CAD (coronary artery disease)    a. mild per cath in 2004;  b. nonischemic Myoview  in March 2012;  c. Lex MV 1/14:  EF 66%, no ischemia   Cataract    Cold intolerance of hand 12/18/2021   Degenerative arthritis of thumb 02/12/2014   Disequilibrium 04/25/2006   Formatting of this note might be different from the original. Formatting of this note might be different from the original. Qualifier: Diagnosis of By: Scarlet MD, Elsie Last Assessment & Plan: Formatting of this note might be different from the original. Now describing ear fullness more on left side.  Perhaps the removal of cerumen impaction will help.  No further WU given previously seen by ENT   Diverticulosis of colon 04/25/2006   Qualifier: Diagnosis of   By: Scarlet MD, Elsie       Replacing diagnoses that were inactivated after the 05/28/22 regulatory import     DJD, UNSPECIFIED 04/25/2006   Qualifier: Diagnosis of   By: Scarlet MD, Elsie      Replacing diagnoses that were inactivated after the 05/28/22 regulatory import     Dysrhythmia    A-Fib. 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     Hyperlipidemia    Hypertension    Hypothyroidism    Neuromuscular disorder (HCC)    neuropathy in feet   Obesity    Personal history of colonic polyps 06/06/2009   cecal polyp   Rheumatoid arthritis (HCC)  Shingles 2023   Sleep apnea    lost weight no longer needs   Type 2 diabetes mellitus with diabetic chronic kidney disease (HCC) 04/25/2006       Past Surgical History:  Procedure Laterality Date   ARTERY BIOPSY N/A 08/31/2021   Procedure: LEFT TEMPORAL ARTERY BIOPSY;  Surgeon: Vernetta Berg, MD;  Location: [facility];  Service: General;  Laterality: N/A;   BREAST BIOPSY Left    BREAST EXCISIONAL BIOPSY Left    benign   CARDIAC CATHETERIZATION  1999&2004   CARPAL TUNNEL RELEASE Bilateral 2003   CATARACT EXTRACTION Bilateral 1990   CHOLECYSTECTOMY N/A 07/05/2021   Procedure: LAPAROSCOPIC CHOLECYSTECTOMY;  Surgeon: Lyndel Deward PARAS, MD;  Location: [facility];  Service: General;  Laterality: N/A;   COLONOSCOPY     ESOPHAGOGASTRODUODENOSCOPY  08/13/2011   Procedure: ESOPHAGOGASTRODUODENOSCOPY (EGD);  Surgeon: Lupita FORBES Commander, MD;  Location: [facility];  Service: Endoscopy;  Laterality: N/A;   implantable loop recorder placement  07/20/2020   Medtronic Reveal Linq model LNQ 22 ([place] [id number]) implantable loop recorder by Dr  Kelsie   Allergies[1]       02/26/2024    9:49 AM 01/21/2024    3:25 PM 10/15/2023   10:35 AM  Depression screen PHQ 2/9  Decreased Interest 0 0 0  Down, Depressed, Hopeless 0 0 0  PHQ - 2 Score 0 0 0  Altered sleeping 0 0 0  Tired, decreased energy 0 0 0  Change in appetite 0 0 0  Feeling bad or failure about yourself  0 0 0  Trouble concentrating 0 0 0  Moving slowly or fidgety/restless 0 0 0  Suicidal thoughts 0 0 0  PHQ-9 Score 0 0 0   Difficult doing work/chores Not difficult at all Not difficult at all Not difficult at all     Data saved with a previous flowsheet row definition       02/26/2024    9:50 AM 01/21/2024    3:25 PM 10/15/2023   10:36 AM 07/15/2023    3:00 PM  GAD 7 : Generalized Anxiety Score  Nervous, Anxious, on Edge 0 0 0 0  Control/stop worrying 0 0 0 0  Worry too much - different things 0 0 0 0  Trouble relaxing 0 0 0 0  Restless 0 0 0 0  Easily annoyed or irritable 0 0 0 0  Afraid - awful might happen 0 0 0 0  Total GAD 7 Score 0 0 0 0  Anxiety Difficulty Not difficult at all Not difficult at all Not difficult at all Not difficult at all      Review of Systems  Constitutional:  Negative for chills and fever.  Respiratory:  Negative for shortness of breath.   Cardiovascular:  Positive for leg swelling. Negative for chest pain.  Gastrointestinal:  Negative for abdominal pain, constipation, diarrhea, heartburn, nausea and vomiting.  Genitourinary:  Negative for dysuria, frequency and urgency.  Musculoskeletal:  Positive for joint pain.  Neurological:  Negative for dizziness and headaches.  Endo/Heme/Allergies:  Negative for polydipsia.  Psychiatric/Behavioral:  Negative for depression and suicidal ideas. The patient is not nervous/anxious.       Objective:     BP 120/60   Pulse 67   Temp 97.7 F (36.5 C) (Temporal)   Ht 5' 5 (1.651 m)   Wt 169 lb 12.8 oz (77 kg)   SpO2 97%   BMI 28.26 kg/m  BP Readings from Last 3 Encounters:   02/26/24 120/60  01/21/24 136/78  01/06/24 128/70   Wt Readings from Last 3 Encounters:  02/26/24 169 lb 12.8 oz (77 kg)  01/21/24 159 lb (72.1 kg)  01/06/24 163 lb 12.8 oz (74.3 kg)      Physical Exam Vitals and nursing note reviewed.  Constitutional:      Appearance: Normal appearance.  Cardiovascular:     Rate and Rhythm: Normal rate and regular rhythm.     Pulses:          Posterior tibial pulses are 2+ on the right side and 1+ on the left side.     Heart sounds: Normal heart sounds.  Pulmonary:     Effort: Pulmonary effort is normal.     Breath sounds: Normal breath sounds.  Musculoskeletal:     Right shoulder: No swelling or bony tenderness. Decreased range of motion. Normal strength.     Left shoulder: No swelling or bony tenderness. Normal range of motion.     Right lower leg: No swelling.     Left lower leg: Swelling present. No tenderness or bony tenderness. 3+ Edema present.     Left ankle: Swelling present. Decreased range of motion.     Right foot: Normal range of motion.     Left foot: Decreased range of motion. Swelling present.  Feet:     Left foot:     Skin integrity: No ulcer, erythema or dry skin.  Neurological:     Mental Status: She is alert and oriented to person, place, and time.  Psychiatric:        Mood and Affect: Mood normal.        Behavior: Behavior normal.        Thought Content: Thought content normal.        Judgment: Judgment normal.      No results found for any visits on 02/26/24.     The ASCVD Risk score (Arnett DK, et al., 2019) failed to calculate for the following reasons:   The 2019 ASCVD risk score is only valid for ages 2 to 59   * - Cholesterol units were assumed    Assessment & Plan:  Left leg swelling -     VAS US  LOWER EXTREMITY VENOUS (DVT); Future  Rotator cuff syndrome of right shoulder    Assessment and Plan Assessment & Plan Left lower extremity swelling, rule out deep vein thrombosis Acute swelling  with low suspicion for DVT due to current anticoagulation therapy. - Ordered left leg vascular ultrasound to rule out DVT. - Advised use of compression stockings and warm compresses. - Instructed to go to the ER if swelling worsens or does not improve.  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 Prednisone  not prescribed due to side effects. - Advised taking Tylenol  Arthritis Extra Strength twice daily for pain management. - Recommended icing the shoulder and using warm compresses. - Instructed to discuss potential physical therapy with rheumatology. - Advised follow-up with rheumatology on Friday for further evaluation and potential injection therapy.   Return if symptoms worsen or fail to improve.    Carrol Aurora, NP     [1]  Allergies Allergen Reactions   Actos [Pioglitazone] Swelling    Swelling all over body and moonface   Lisinopril Swelling    Swelling, may have had some breathing involvement. Angioedema   Nsaids Other (See Comments)    Patient reports internal bleeding. Experienced with tolmetin.    Penicillins Shortness Of  Breath and Swelling    Arm Swelling with Penicillin (Occurred in 1960s) Breathing - throat swelling with Amoxicillin (Occurred prior to 2002)   Sulfamethoxazole Hives and Itching    welps all over immediately after dose   Flecainide  Other (See Comments)    Blurry  vision   Hctz [Hydrochlorothiazide ] Other (See Comments)    Gout   Iodinated Contrast Media Itching    Pt. Developed mild itching after receiving IV cm; pt. Held; Dr. Garner recomended she take 50 mg of benadryl  when she goes home-if necessary; Dr Tarry recommends benadryl  prior to future exams requiring contrast media, but stated other doctors may recommend another premedication prep.   Prednisone  Other (See Comments)    Agitation, vivid dreams, anxiety does not want to take again.    Statins Other (See  Comments)    Muscle aches with multiple statins. Able to tolerate rosuvastatin .   Gabapentin  Other (See Comments)    Caused dysphoria    Losartan  Potassium Swelling    Lower extremity swelling   Zetia  [Ezetimibe ] Other (See Comments)    Cramps   "

## 2024-02-26 NOTE — Telephone Encounter (Signed)
 Called and discussed results with patient on the phone.

## 2024-02-26 NOTE — Telephone Encounter (Signed)
 Missy from  [facility]cl[facility] lab called in stated that the pt tested negative for DVT

## 2024-03-02 ENCOUNTER — Encounter: Payer: Self-pay | Admitting: Urology

## 2024-03-02 ENCOUNTER — Ambulatory Visit: Admitting: Urology

## 2024-03-02 VITALS — BP 149/81 | HR 62 | Ht 64.5 in | Wt 165.0 lb

## 2024-03-02 DIAGNOSIS — N3281 Overactive bladder: Secondary | ICD-10-CM

## 2024-03-02 DIAGNOSIS — R350 Frequency of micturition: Secondary | ICD-10-CM

## 2024-03-02 NOTE — Progress Notes (Signed)
 "  03/02/2024 11:28 AM   Courtney Pena 27-Oct-1940 [id number]  Referring provider: Vincente Shivers, NP [address] [place],  [place] [address]  Chief Complaint  Patient presents with   Urinary Frequency   Urologic history: 1.  Overactive bladder Trial trospium -discontinued secondary to side effects   HPI: Courtney Pena is a 84 y.o. female who presents for follow-up.  Initially seen May 2025 for overactive bladder and right lower quadrant abdominal pain. Trial trospium  given for her urinary symptoms.  She states she could not tolerate secondary to side effects but does not remember the specifics A CT was recommended for her abdominal pain which she never scheduled however was seen in the ED September 2025 for evaluation of this pain.  CT of the abdomen/pelvis showed no significant abnormalities.  She does have multiple bilateral renal cysts.   PMH: Past Medical History:  Diagnosis Date   Adenomatous polyp of colon 10/23/2021   Last colonoscopy was 02/2018 recommended 3 year follow up.       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  Did not tolerate zaralto for PAF.  Seems to tolerate ASA.[name]  Likely needs long term iron  to keep up with ongoing loss.     Arthritis    Atrial flutter (HCC) 12/07/2010   converted in ED with 300 mg flecainide    CAD (coronary artery disease)    a. mild per cath in 2004;  b. nonischemic Myoview  in March 2012;  c. Lex MV 1/14:  EF 66%, no ischemia   Cataract    Cold intolerance of hand 12/18/2021   Degenerative arthritis of thumb 02/12/2014   Disequilibrium 04/25/2006   Formatting of this note might be different from the original. Formatting of this note might be different from the original. Qualifier: Diagnosis of By: Scarlet MD, Elsie Last Assessment & Plan:  Formatting of this note might be different from the original. Now describing ear fullness more on left side.  Perhaps the removal of cerumen impaction will help.  No further WU given previously seen by ENT   Diverticulosis of colon 04/25/2006   Qualifier: Diagnosis of   By: Scarlet MD, Elsie      Replacing diagnoses that were inactivated after the 05/28/22 regulatory import     DJD, UNSPECIFIED 04/25/2006   Qualifier: Diagnosis of   By: Scarlet MD, Elsie      Replacing diagnoses that were inactivated after the 05/28/22 regulatory import     Dysrhythmia    A-Fib. 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     Hyperlipidemia    Hypertension    Hypothyroidism    Neuromuscular disorder (HCC)    neuropathy in feet   Obesity    Personal history of colonic polyps 06/06/2009   cecal polyp   Rheumatoid arthritis (HCC)    Shingles 2023   Sleep apnea    lost weight no longer needs   Type 2 diabetes  mellitus with diabetic chronic kidney disease (HCC) 04/25/2006        Surgical History: Past Surgical History:  Procedure Laterality Date   ARTERY BIOPSY N/A 08/31/2021   Procedure: LEFT TEMPORAL ARTERY BIOPSY;  Surgeon: Vernetta Berg, MD;  Location: [facility];  Service: General;  Laterality: N/A;   BREAST BIOPSY Left    BREAST EXCISIONAL BIOPSY Left    benign   CARDIAC CATHETERIZATION  1999&2004   CARPAL TUNNEL RELEASE Bilateral 2003   CATARACT EXTRACTION Bilateral 1990   CHOLECYSTECTOMY N/A 07/05/2021   Procedure: LAPAROSCOPIC CHOLECYSTECTOMY;  Surgeon: Lyndel Deward PARAS, MD;  Location: [facility];  Service: General;  Laterality: N/A;   COLONOSCOPY      ESOPHAGOGASTRODUODENOSCOPY  08/13/2011   Procedure: ESOPHAGOGASTRODUODENOSCOPY (EGD);  Surgeon: Lupita FORBES Commander, MD;  Location[facility] ENDOSCOPY;  Service: Endoscopy;  Laterality: N/A;   implantable loop recorder placement  07/20/2020   Medtronic Reveal Linq model LNQ 22 ([place] [id number]) implantable loop recorder by Dr Kelsie    Home Medications:  Allergies as of 03/02/2024       Reactions   Actos [pioglitazone] Swelling   Swelling all over body and moonface   Lisinopril Swelling   Swelling, may have had some breathing involvement. Angioedema   Nsaids Other (See Comments)   Patient reports internal bleeding. Experienced with tolmetin.    Penicillins Shortness Of Breath, Swelling   Arm Swelling with Penicillin (Occurred in 1960s) Breathing - throat swelling with Amoxicillin (Occurred prior to 2002)   Sulfamethoxazole Hives, Itching   welps all over immediately after dose   Flecainide  Other (See Comments)   Blurry  vision   Hctz [hydrochlorothiazide ] Other (See Comments)   Gout   Iodinated Contrast Media Itching   Pt. Developed mild itching after receiving IV cm; pt. Held; Dr. Garner recomended she take 50 mg of benadryl  when she goes home-if necessary; Dr Tarry recommends benadryl  prior to future exams requiring contrast media, but stated other doctors may recommend another premedication prep.   Prednisone  Other (See Comments)   Agitation, vivid dreams, anxiety does not want to take again.   Statins Other (See Comments)   Muscle aches with multiple statins. Able to tolerate rosuvastatin .   Gabapentin  Other (See Comments)   Caused dysphoria   Losartan  Potassium Swelling   Lower extremity swelling   Zetia  [ezetimibe ] Other (See Comments)   Cramps        Medication List        Accurate as of March 02, 2024 11:28 AM. If you have any questions, ask your nurse or doctor.          allopurinol  100 MG tablet Commonly known as: ZYLOPRIM  Take 1 tablet (100 mg total) by mouth  daily.   apixaban  5 MG Tabs tablet Commonly known as: Eliquis  Take 1 tablet (5 mg total) by mouth 2 (two) times daily.   blood glucose meter kit and supplies Dispense based on patient and insurance preference. Use up to four times daily as directed. (FOR ICD-10 E10.9, E11.9).   carvedilol  12.5 MG tablet Commonly known as: COREG  Take 1 tablet (12.5 mg total) by mouth 2 (two) times daily.   CoQ10 100 MG Caps Take 100 mg by mouth daily.   diclofenac  Sodium 1 % Gel Commonly known as: VOLTAREN  Apply 4 g topically 4 (four) times daily.   diltiazem  30 MG tablet Commonly known as: Cardizem  One tab by mouth every six hours as needed for atrial fib   diltiazem  360 MG 24 hr capsule  Commonly known as: CARDIZEM  CD Take 1 capsule (360 mg total) by mouth daily.   fluticasone  50 MCG/ACT nasal spray Commonly known as: FLONASE  SPRAY 2 SPRAYS INTO EACH NOSTRIL EVERY DAY   hydrOXYzine  10 MG tablet Commonly known as: ATARAX  Take 1 tablet (10 mg total) by mouth 2 (two) times daily as needed for itching.   levothyroxine  125 MCG tablet Commonly known as: SYNTHROID  Take 1 tablet (125 mcg total) by mouth daily.   meclizine  25 MG tablet Commonly known as: ANTIVERT  TAKE 1 TABLET (25 MG TOTAL) BY MOUTH AS NEEDED.   metFORMIN  500 MG tablet Commonly known as: GLUCOPHAGE  Take by mouth daily with breakfast.   Multi-Vitamins Tabs Take 1 tablet by mouth daily.   nitroGLYCERIN  0.4 MG SL tablet Commonly known as: NITROSTAT  Place 1 tablet (0.4 mg total) under the tongue every 5 (five) minutes as needed for chest pain (Call 911 if chest pain after three doses).   omeprazole  40 MG capsule Commonly known as: PRILOSEC TAKE 1 CAPSULE (40 MG TOTAL) BY MOUTH DAILY.   ONE TOUCH ULTRA 2 w/Device Kit 1 each by Does not apply route daily. Use glucometer to check blood sugar 3 times daily. E11.22   OneTouch Ultra test strip Generic drug: glucose blood Use to check blood sugar up to three times daily as  instructed (or frequency covered by insurance). E11.22   onetouch ultrasoft lancets Use to check blood sugar up to 3 times daily (or frequency covered by insurance). E11.22   Pen Needles 31G X 6 MM Misc Use nightly with insulin .   TechLite Pen Needles 32G X 6 MM Misc Generic drug: Insulin  Pen Needle Use as directed once daily.   PREVAGEN PO Take 1 tablet by mouth daily.   rosuvastatin  10 MG tablet Commonly known as: CRESTOR  Take 1 tablet (10 mg total) by mouth daily.   Semaglutide  (1 MG/DOSE) 4 MG/3ML Sopn Inject 1 mg as directed once a week.   VITAMIN D  (CHOLECALCIFEROL ) PO Take by mouth. 4000mg         Allergies: Allergies[1]  Family History: Family History  Problem Relation Age of Onset   Heart disease Father    Diabetes Maternal Grandfather    Hypertension Mother    Other Mother        brain tumor-benign   Diabetes Daughter        pre-diabeties   Colon polyps Daughter    Thyroid  disease Daughter        x 2   Colon cancer Neg Hx    Esophageal cancer Neg Hx    Stomach cancer Neg Hx    Rectal cancer Neg Hx     Social History:  reports that she quit smoking about 31 years ago. Her smoking use included cigarettes. She started smoking about 61 years ago. She has a 60 pack-year smoking history. She has been exposed to tobacco smoke. She has never used smokeless tobacco. She reports that she does not drink alcohol and does not use drugs.   Physical Exam: BP (!) 149/81   Pulse 62   Ht 5' 4.5 (1.638 m)   Wt 165 lb (74.8 kg)   BMI 27.88 kg/m   Constitutional:  Alert, No acute distress. HEENT: [place] AT Respiratory: Normal respiratory effort, no increased work of breathing. Psychiatric: Normal mood and affect.   Pertinent Imaging: CT images from September 2025 were personally reviewed and interpreted    Assessment & Plan:    1.  Overactive bladder PVR today 0 mL  We are currently out of Gemtesa samples and once they are received we will give a trial of  Gemtesa Pelvic floor physical therapy was also discussed   Glendia JAYSON Barba, MD  [facility] [address] [place], [place] [address] [phone]     [1]  Allergies Allergen Reactions   Actos [Pioglitazone] Swelling    Swelling all over body and moonface   Lisinopril Swelling    Swelling, may have had some breathing involvement. Angioedema   Nsaids Other (See Comments)    Patient reports internal bleeding. Experienced with tolmetin.    Penicillins Shortness Of Breath and Swelling    Arm Swelling with Penicillin (Occurred in 1960s) Breathing - throat swelling with Amoxicillin (Occurred prior to 2002)   Sulfamethoxazole Hives and Itching    welps all over immediately after dose   Flecainide  Other (See Comments)    Blurry  vision   Hctz [Hydrochlorothiazide ] Other (See Comments)    Gout   Iodinated Contrast Media Itching    Pt. Developed mild itching after receiving IV cm; pt. Held; Dr. Garner recomended she take 50 mg of benadryl  when she goes home-if necessary; Dr Tarry recommends benadryl  prior to future exams requiring contrast media, but stated other doctors may recommend another premedication prep.   Prednisone  Other (See Comments)    Agitation, vivid dreams, anxiety does not want to take again.    Statins Other (See Comments)    Muscle aches with multiple statins. Able to tolerate rosuvastatin .   Gabapentin  Other (See Comments)    Caused dysphoria    Losartan  Potassium Swelling    Lower extremity swelling   Zetia  [Ezetimibe ] Other (See Comments)    Cramps   "

## 2024-03-03 ENCOUNTER — Encounter: Payer: Self-pay | Admitting: Urology

## 2024-03-04 ENCOUNTER — Other Ambulatory Visit: Admitting: Pharmacist

## 2024-03-04 DIAGNOSIS — E1122 Type 2 diabetes mellitus with diabetic chronic kidney disease: Secondary | ICD-10-CM

## 2024-03-04 NOTE — Progress Notes (Unsigned)
 Brief Telephone Documentation Reason for Call: Diabetes follow up / insurance review  Summary of Call: Two boxes of 2 mg pens One box of 1 mg pen One opened pen Notes all except for 1 box expired 10/2023 (1 mg dose)  Eliquis : Has 1.5 month supply left at home  Next refill due 03/18/23  **Please note, any time a new Ozempic  pen has been opened, it is good to use for 56 days (8 weeks, 8 doses). After this point, the medication is expired and should be discarded, even if you see some liquid remaining.   Ozempic  ( 2 mg pen )  Ozempic  Dose via 2 mg Pen Number of Clicks   1 mg   36 clicks      ***gfr     Questions surrounding Ozempic  and test strips for her glucometer  Pen Needles Patient states she has run out of needles for Ozempic  (states the needles that come in the Ozempic  are too short and tend to bend so she runs out early. Instead, has been using pen needles from her late husbands stash which are longer though has run out of these as well). Notes she tried to purchase OTC needles and the pharmacy told her the cost would be $100  Stardarnd NovoFine needle = 4 mm.  Can try a 6mm pen needle at the pharmacy to see if this works for her.   Testing supplies Test strip refill needed. Patient confirms current glucometer = OneTouch. Will also sent lancet refill.   Ozempic  / Patient Assistance  Still has Ozempic  on hand and has been doing well with it. Reports 2 boxes left, one has been opened (just under 2 month supply remaining).  Reviewed Ozempic  program changes again with patient (no longer able to receive Ozempic  or Rybelsus  through PAP in 2026).   Insurance Patient notes she has elected to stay with the same Medicare plan for 2026  2025 Benefit: [email or web address] Preferred brand = $47/month ($420 deductible)     Plan:  Eliquis :  ongoing concern for cost. Reports family last year helped to pay her deductible, then $47/month was not  ideal but was feasible with other medications free through PAP. This year, test claim showing same deductible though with copay increaed to $80/month thereafter.  Reviewed full report of last echo 07/01/23:  1. Left ventricular ejection fraction, by estimation, is 60 to 65%. Left  ventricular ejection fraction by 3D volume is 42 %. The left ventricle has  normal function. The left ventricle has no regional wall motion  abnormalities. There is mild left ventricular hypertrophy. Left ventricular diastolic parameters are  consistent with Grade I diastolic dysfunction (impaired relaxation). The  average left ventricular global longitudinal strain is -17.3 %. The global  longitudinal strain is normal.   2. Right ventricular systolic function is normal. The right ventricular  size is normal.   3. Left atrial size was severely dilated.   4. A small pericardial effusion is present.   5. The mitral valve is degenerative. Mild mitral valve regurgitation.   6. The aortic valve is tricuspid. Aortic valve regurgitation is not  visualized.   7. The inferior vena cava is normal in size with <50% respiratory  variability, suggesting right atrial pressure of 8 mmHg.     Pen needles = Tier 3, expensive through insurance Called [facility] cash price = $10 for qty 50 [facility] = <$9 qty 100 Test strips and lancets sent for OneTouch meter (test claim in Epic shows $0,  though for future reference, [employer] has preferred = Accu-chek, Contour) Will need to discuss alternative(s) to Ozempic  in the new year. Patient notes she has elected to go with the same Medicare health plan as this year (brand meds have all been unaffordable with this plan).  Can consider DPP4i Januvia  or Tradjenta in 2026 (tolerated Januvia  well in the past, though was switched to GLP1 for greater efficacy. Do not expect comparable A1c benefit with current Ozempic  regimen though is an option through PAP if needed.  Pen needles showing as Tier 3 on  [employer]. Test claims therefore are not $0. Cash price through [facility] or local pharmacy may be cheaper than through insurance.   Follow Up: Patient given direct line for further questions/concerns. Phone visit ~1 month to review insurance coverage/Diabetes medication options in the new year   Future Appointments  Date Time Provider Department Center  03/18/2024  7:10 AM [facility] DEVICE REMOTES [facility]  04/17/2024 11:20 AM Lesia Ozell Barter, PA-C [facility]  04/22/2024  2:00 PM Vincente Shivers, NP [facility] [id number]    Manuelita FABIENE Kobs, PharmD Clinical Pharmacist [facility] [phone]

## 2024-03-06 ENCOUNTER — Ambulatory Visit: Admitting: Podiatry

## 2024-03-12 ENCOUNTER — Encounter: Payer: Self-pay | Admitting: Pharmacist

## 2024-03-12 MED ORDER — TRULICITY 4.5 MG/0.5ML ~~LOC~~ SOAJ: 4.5000 mg | SUBCUTANEOUS | 3 refills | Status: AC

## 2024-03-12 NOTE — Progress Notes (Signed)
 Printed for PCP to sign and placed in provider folder

## 2024-03-12 NOTE — Progress Notes (Signed)
 Patient Assistance Program (PAP) Application   Manufacturer: Secretary/administrator    (New enrollment) Medication(s): Trulicity  4.5 mg  Patient Portion of Application:  Completed with patient via online enrollment tool. Submitted. Uploaded to Media Tab. [id number] Income Documentation: N/A - Electronic verification elected.  Provider Portion of Application:  03/12/24: Provider portion completed by PharmD and uploaded PCP eFax folder for signature. Clinical Pool/CMA notified.  Prescription(s): Electronic Rx sent to [facility])   Next Steps: [x]    Provider portion of application filled out and uploaded to [facility] eFax folder for review/signature (1 page) []    Upon PCP signature, Application to be faxed to [facility] PAP team AND scanned to chart: [facility] PAP Team: CPhT Patient Advocate Team Fax: [phone]  Note routed to PCP Clinic Pool to ensure PCP signature is obtained and application is faxed. [facility] PAP Spreadsheet updated and Pool cc'd as FYI (application not submitted by clinic, PAP team to fax provider page to company once received)  [facility] clinic team - Please Addend/update this note as the Next Steps are completed in office*

## 2024-03-15 ENCOUNTER — Encounter

## 2024-03-16 ENCOUNTER — Encounter

## 2024-03-16 ENCOUNTER — Telehealth: Payer: Self-pay

## 2024-03-16 NOTE — Telephone Encounter (Signed)
 Received approval letter from [employer] (Trulicity ) thru 02/25/2025 approval letter index.

## 2024-03-18 ENCOUNTER — Ambulatory Visit

## 2024-03-18 DIAGNOSIS — I480 Paroxysmal atrial fibrillation: Secondary | ICD-10-CM | POA: Diagnosis not present

## 2024-03-19 LAB — CUP PACEART REMOTE DEVICE CHECK
Date Time Interrogation Session: 20260120230955
Implantable Pulse Generator Implant Date: 20220525

## 2024-03-21 ENCOUNTER — Other Ambulatory Visit: Payer: Self-pay | Admitting: Cardiology

## 2024-03-21 ENCOUNTER — Other Ambulatory Visit: Payer: Self-pay | Admitting: General Practice

## 2024-03-21 DIAGNOSIS — E1140 Type 2 diabetes mellitus with diabetic neuropathy, unspecified: Secondary | ICD-10-CM

## 2024-03-21 NOTE — Progress Notes (Signed)
 Remote Loop Recorder Transmission

## 2024-03-24 ENCOUNTER — Ambulatory Visit: Payer: Self-pay | Admitting: Cardiovascular Disease

## 2024-03-26 ENCOUNTER — Encounter

## 2024-03-26 NOTE — Progress Notes (Unsigned)
 Error   This encounter was created in error - please disregard.

## 2024-03-27 ENCOUNTER — Encounter: Payer: Self-pay | Admitting: Physician Assistant

## 2024-03-27 ENCOUNTER — Ambulatory Visit: Attending: Physician Assistant | Admitting: Physician Assistant

## 2024-04-14 ENCOUNTER — Ambulatory Visit: Admitting: Physician Assistant

## 2024-04-15 ENCOUNTER — Encounter

## 2024-04-17 ENCOUNTER — Ambulatory Visit: Admitting: Student

## 2024-04-18 ENCOUNTER — Ambulatory Visit

## 2024-04-22 ENCOUNTER — Ambulatory Visit: Admitting: General Practice

## 2024-05-16 ENCOUNTER — Encounter

## 2024-05-19 ENCOUNTER — Ambulatory Visit

## 2024-06-19 ENCOUNTER — Ambulatory Visit

## 2024-07-20 ENCOUNTER — Ambulatory Visit

## 2024-08-20 ENCOUNTER — Ambulatory Visit

## 2024-09-20 ENCOUNTER — Ambulatory Visit

## 2024-10-21 ENCOUNTER — Ambulatory Visit

## 2024-11-21 ENCOUNTER — Ambulatory Visit

## 2024-12-22 ENCOUNTER — Ambulatory Visit

## 2025-01-22 ENCOUNTER — Ambulatory Visit

## 2025-02-22 ENCOUNTER — Ambulatory Visit
